# Patient Record
Sex: Male | Born: 1970 | Race: White | Hispanic: No | Marital: Married | Smoking: Current every day smoker
Health system: Southern US, Community
[De-identification: ages and names within clinical notes are randomized; demographics above are authoritative.]

## PROBLEM LIST (undated history)

## (undated) DIAGNOSIS — C801 Malignant (primary) neoplasm, unspecified: Secondary | ICD-10-CM

## (undated) DIAGNOSIS — Z720 Tobacco use: Secondary | ICD-10-CM

## (undated) HISTORY — DX: Malignant (primary) neoplasm, unspecified: C80.1

## (undated) HISTORY — PX: RECTAL SURGERY: SHX760

## (undated) HISTORY — PX: HERNIA REPAIR: SHX51

---

## 2005-06-08 HISTORY — PX: INGUINAL HERNIA REPAIR: SHX194

## 2005-11-06 ENCOUNTER — Ambulatory Visit (HOSPITAL_BASED_OUTPATIENT_CLINIC_OR_DEPARTMENT_OTHER): Admission: RE | Admit: 2005-11-06 | Discharge: 2005-11-06 | Payer: Self-pay | Admitting: *Deleted

## 2007-03-25 ENCOUNTER — Emergency Department (HOSPITAL_COMMUNITY): Admission: EM | Admit: 2007-03-25 | Discharge: 2007-03-25 | Payer: Self-pay | Admitting: Emergency Medicine

## 2010-10-24 NOTE — Op Note (Signed)
James Coffey, James Coffey                 ACCOUNT NO.:  [id number]   MEDICAL RECORD NO.:  [id number]          PATIENT TYPE:  AMB   LOCATION:  [facility]                         FACILITY:  [facility]   PHYSICIAN:  Alfonse Ras, MD   DATE OF BIRTH:  Apr 21, 1971   DATE OF PROCEDURE:  11/06/2005  DATE OF DISCHARGE:                                 OPERATIVE REPORT   PREOPERATIVE DIAGNOSIS:  Recurrent left inguinal hernia.   POSTOPERATIVE DIAGNOSIS:  Recurrent left inguinal hernia.   PROCEDURE:  Recurrent left inguinal hernia repair with mesh.   SURGEON:  Alfonse Ras, MD.   ANESTHESIA:  General.   DESCRIPTION OF PROCEDURE:  The patient was taken to the operating room,  placed in a supine position. After adequate anesthesia was induced using a  laryngeal mask, the left groin was prepped and draped in the normal sterile  fashion. Using an oblique incision over the left inguinal canal, I dissected  down to the external oblique fascia and opened it along its fibers. The  ilioinguinal nerve could not be identified secondary to significant fibrosis  and scar. The spermatic cord was surrounded at the external ring. A Penrose  drain was placed around it and a direct hernia defect was identified. This  was reduced into the abdominal cavity up to the internal ring. The floor of  Hasselbalch's triangle was reinforced using interrupted #0 Surgilon sutures  and approximating transversalis fascia to the inguinal ligament. A piece of  Prolene mesh was then cut to fit and tacked using a running 2-0 Prolene  suture from the pubic tubercle along the inguinal ligament and brought out  lateral to the internal ring. It was also tacked to the transversalis fascia  superiorly. The internal ring allowed __________ Tresa Endo clamp tip through it  along with the spermatic cord. Adequate hemostasis was ensured. The external  oblique fascia was closed with running 3-0 Vicryl. All tissues were injected  with 0.5 Marcaine. The  skin was closed with staples. The patient tolerated  the procedure well and went to PACU in good condition.      Alfonse Ras, MD  Electronically Signed     KRE/MEDQ  D:  11/06/2005  T:  11/06/2005  Job:  [id number]

## 2013-04-11 ENCOUNTER — Ambulatory Visit: Payer: Self-pay | Admitting: Physician Assistant

## 2013-04-11 ENCOUNTER — Ambulatory Visit: Payer: Self-pay

## 2013-04-11 VITALS — BP 100/60 | HR 70 | Temp 97.0°F | Resp 16 | Ht 69.5 in | Wt 163.0 lb

## 2013-04-11 DIAGNOSIS — Z0289 Encounter for other administrative examinations: Secondary | ICD-10-CM

## 2013-04-11 NOTE — Progress Notes (Signed)
U/A SpGr- 1.010 Protein- Neg Blood- Neg Glucose- Neg 

## 2013-04-11 NOTE — Progress Notes (Signed)
This patient presents for DOT examination for fitness for duty.   Medical History: no  Any illness or injury in the last 5 years? no  Head/Brain Injuries, disorders or illnesses no  Seizures, epilepsy no  Eye disorders or impaired vision (except corrective lenses) no  Ear disorders, loss of hearing or balance no  Heart disease or heart attack; other cardiovascular condition no  Heart surgery (valve replacement/bypass, angioplasty, pacemaker) no  High blood pressure no  Muscular disease no  Shortness of breath no  Lung disease, emphysema, asthma, chronic bronchitis no  Kidney disease, dialysis no  Liver disease no  Digestive problems no  Diabetes or elevated blood sugar no  Nervious or psychiatric disorders, e.g., severe depression no  Loss of, or altered consciousness no  Fainting, dizziness no  Sleep disorders, pauses in breathing while asleep, daytime sleepiness, loud snoring no  Stroke or paralysis no  Missing or impaired hand, arm, foot, leg, finger, toe no  Spinal injury or disease no  Chronic low back pain no  Regular, frequent alcohol use no  Narcotic or habit forming drug use  Current Medications: none  Primary Care Provider: No primary provider on file. Specialists: none  Medical Examiner's Comments on Health History:  Hernia repair about 2007.  No other history of illness or injury.  Denies chronic medical problems.  ROS is negative.  TESTING:   Visual Acuity Screening   Right eye Left eye Both eyes  Without correction: 20/15-2 20/15-2 20/13-1  With correction:     Comments: Peripheral Vision:Right eye 85 degrees.Left eye 85 degrees.The patient can distinguish the colors red, amber and green.   Monocular Vision: no  Hearing Aid used for test: no Hearing Aid required to to meet standard: no Distance from individual at which forced whispered voice can first be heard:   RIGHT ear 10 feet; LEFT ear 10 feet If audiometer used, record hearing loss in  decibels:  RIGHT ear average N/A dB  LEFT ear average N/A dB  BP 100/60  Pulse 70  Temp(Src) 97 F (36.1 C) (Oral)  Resp 16  Ht 5' 9.5" (1.765 m)  Wt 163 lb (73.936 kg)  BMI 23.73 kg/m2  SpO2 98% Pulse rate is regular  Urine Specimen: Specific Gravity 1.010, Protein NEG, Blood NEG, Sugar NEG  Other Testing: not indicated  PHYSICAL EXAMINATION:  1. General Appearance  No Marked overweight, tremor, signs of alcoholism, problem drinking or drug abuse 2. Eyes    No 3. Ears    No 4. Mouth and Throat   No 5. Heart    No 6. Lungs and Chest, not including breast examination No 7. Abdomen and Viscera  No 8. Vascular System   No 9. Genitourinary System  No 10. Extremities-Limb impaired. 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    No 11. Spine, other musculoskeletal No 12. Neurological   No  Comments: normal exam.  Certification Status: does meet standards for 2 year certificate.  Wearing corrective lenses: no Wearing hearing aid: no Accompanied by a N/A waiver/exemption Skill performance Evaluation (SPE) Certificate: no

## 2017-04-12 ENCOUNTER — Encounter: Payer: Self-pay | Admitting: Family Medicine

## 2017-04-12 ENCOUNTER — Ambulatory Visit (INDEPENDENT_AMBULATORY_CARE_PROVIDER_SITE_OTHER): Payer: Self-pay | Admitting: Family Medicine

## 2017-04-12 VITALS — BP 130/76 | HR 92 | Temp 97.9°F | Resp 16 | Ht 69.0 in | Wt 186.0 lb

## 2017-04-12 DIAGNOSIS — Z024 Encounter for examination for driving license: Secondary | ICD-10-CM

## 2017-04-12 NOTE — Progress Notes (Signed)
Subjective:  By signing my name below, I, James Coffey, attest that this documentation has been prepared under the direction and in the presence of James Agreste, MD Electronically Signed: Ladene Artist, ED Scribe 04/12/2017 at 1:52 PM.   Patient ID: James Coffey, male    DOB: [birth year], 46 y.o.   MRN: [id number]  Chief Complaint  Patient presents with  . DOT PE   HPI Declin [name] is a 46 y.o. male who presents to Primary Care at [facility] for a DOT physical. Last physical in 2014. No concerns. 2 year card. Denies changes in medical hx since last physical, chronic medical problems, daily medications.   Visual Acuity Screening   Right eye Left eye Both eyes  Without correction: 20/20 20/20 20/20   With correction:     Comments: Color: 6/6 Normal.  Peripheral vision: 85 degrees both eyes.  Hearing Screening Comments: Whisper Test 10 feet both ears. Normal  There are no active problems to display for this patient.  No past medical history on file. Past Surgical History:  Procedure Laterality Date  . INGUINAL HERNIA REPAIR Right 2007   No Known Allergies Prior to Admission medications   Not on File   Social History   Socioeconomic History  . Marital status: Married    Spouse name: Not on file  . Number of children: Not on file  . Years of education: Not on file  . Highest education level: Not on file  Social Needs  . Financial resource strain: Not on file  . Food insecurity - worry: Not on file  . Food insecurity - inability: Not on file  . Transportation needs - medical: Not on file  . Transportation needs - non-medical: Not on file  Occupational History  . Occupation: truck Geophysicist/field seismologist  Tobacco Use  . Smoking status: Current Every Day Smoker    Packs/day: 1.00    Types: Cigarettes  Substance and Sexual Activity  . Alcohol use: No  . Drug use: No  . Sexual activity: Not on file  Other Topics Concern  . Not on file  Social History Narrative   From [place].  Came  to the [place] in 2000.   Review of Systems    Objective:   Physical Exam  Constitutional: He is oriented to person, place, and time. He appears well-developed and well-nourished.  HENT:  Head: Normocephalic and atraumatic.  Right Ear: External ear normal.  Left Ear: External ear normal.  Mouth/Throat: Oropharynx is clear and moist.  Eyes: Conjunctivae and EOM are normal. Pupils are equal, round, and reactive to light.  Neck: Normal range of motion. Neck supple. No thyromegaly present.  Cardiovascular: Normal rate, regular rhythm, normal heart sounds and intact distal pulses.  Pulmonary/Chest: Effort normal and breath sounds normal. No respiratory distress. He has no wheezes.  Abdominal: Soft. He exhibits no distension. There is no tenderness. Hernia confirmed negative in the right inguinal area and confirmed negative in the left inguinal area.  Musculoskeletal: Normal range of motion. He exhibits no edema or tenderness.  Some shortening of L arm but able to ABduct. Full strength and motion for operation of wheel and gearing.  Lymphadenopathy:    He has no cervical adenopathy.  Neurological: He is alert and oriented to person, place, and time. He has normal reflexes.  Skin: Skin is warm and dry.  Psychiatric: He has a normal mood and affect. His behavior is normal.  Vitals reviewed.  Vitals:   04/12/17 1338  BP: 130/76  Pulse: 92  Resp: 16  Temp: 97.9 F (36.6 C)  TempSrc: Oral  SpO2: 98%  Weight: 186 lb (84.4 kg)  Height: 5\' 9"  (1.753 m)      Assessment & Plan:  James Coffey is a 46 y.o. 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 No concerning findings on exam, able to have sufficient range of motion, strength, and reach with left arm, no restrictions, 2 year card. See paperwork. No orders of the defined types were placed in this  encounter.  Patient Instructions    2 year card provided.    IF you received an x-ray today, you will receive an invoice from [facility]. Please contact [facility] at [phone] with questions or concerns regarding your invoice.   IF you received labwork today, you will receive an invoice from [facility]. Please contact [facility] at [phone] with questions or concerns regarding your invoice.   Our billing staff will not be able to assist you with questions regarding bills from these companies.  You will be contacted with the lab results as soon as they are available. The fastest way to get your results is to activate your My Chart account. Instructions are located on the last page of this paperwork. If you have not heard from [place] regarding the results in 2 weeks, please contact this office.      I personally performed the services described in this documentation, which was scribed in my presence. The recorded information has been reviewed and considered for accuracy and completeness, addended by me as needed, and agree with information above.  Signed,   Merri Ray, MD Primary [facility] at [employer].  04/12/17 2:05 PM

## 2017-04-12 NOTE — Patient Instructions (Addendum)
  2 year card provided.    IF you received an x-ray today, you will receive an invoice from [facility]. Please contact [facility] at [phone] with questions or concerns regarding your invoice.   IF you received labwork today, you will receive an invoice from [place]. Please contact [facility] at [phone] with questions or concerns regarding your invoice.   Our billing staff will not be able to assist you with questions regarding bills from these companies.  You will be contacted with the lab results as soon as they are available. The fastest way to get your results is to activate your My Chart account. Instructions are located on the last page of this paperwork. If you have not heard from [place] regarding the results in 2 weeks, please contact this office.

## 2019-09-01 ENCOUNTER — Ambulatory Visit: Payer: Self-pay

## 2019-09-07 ENCOUNTER — Ambulatory Visit: Payer: Self-pay | Attending: Internal Medicine

## 2019-09-07 DIAGNOSIS — Z23 Encounter for immunization: Secondary | ICD-10-CM

## 2019-09-07 NOTE — Progress Notes (Signed)
   Covid-19 Vaccination Clinic  Name:  James Coffey    MRN: [id number] DOB: September 05, 1970  09/07/2019  [name] was observed post Covid-19 immunization for 15 minutes without incident. He was provided with Vaccine Information Sheet and instruction to access the V-Safe system.   [name] was instructed to call 911 with any severe reactions post vaccine: [name] Difficulty breathing  . Swelling of face and throat  . A fast heartbeat  . A bad rash all over body  . Dizziness and weakness   Immunizations Administered    Name Date Dose VIS Date Route   Pfizer COVID-19 Vaccine 09/07/2019  8:53 AM 0.3 mL 05/19/2019 Intramuscular   Manufacturer: [place]   Lot: [id number]   [place]: [id number]

## 2019-10-03 ENCOUNTER — Ambulatory Visit: Payer: Self-pay

## 2019-10-04 ENCOUNTER — Ambulatory Visit: Payer: Self-pay | Attending: Internal Medicine

## 2019-10-04 DIAGNOSIS — Z23 Encounter for immunization: Secondary | ICD-10-CM

## 2019-10-04 NOTE — Progress Notes (Signed)
   Covid-19 Vaccination Clinic  Name:  James Coffey    MRN: [id number] DOB: May 16, 1971  10/04/2019  [name] was observed post Covid-19 immunization for 15 minutes without incident. He was provided with Vaccine Information Sheet and instruction to access the V-Safe system.   [name] was instructed to call 911 with any severe reactions post vaccine: [name] Difficulty breathing  . Swelling of face and throat  . A fast heartbeat  . A bad rash all over body  . Dizziness and weakness   Immunizations Administered    Name Date Dose VIS Date Route   Pfizer COVID-19 Vaccine 10/04/2019  8:54 AM 0.3 mL 08/02/2018 Intramuscular   Manufacturer: [place]   Lot: [id number]   [place]: [id number]

## 2020-11-03 ENCOUNTER — Emergency Department (HOSPITAL_COMMUNITY): Payer: Self-pay

## 2020-11-03 ENCOUNTER — Encounter (HOSPITAL_COMMUNITY): Payer: Self-pay

## 2020-11-03 ENCOUNTER — Other Ambulatory Visit: Payer: Self-pay

## 2020-11-03 ENCOUNTER — Inpatient Hospital Stay (HOSPITAL_COMMUNITY)
Admission: EM | Admit: 2020-11-03 | Discharge: 2020-11-05 | DRG: 394 | Disposition: A | Discharge status: Home / Self Care | Payer: Self-pay | Attending: Internal Medicine | Admitting: Internal Medicine

## 2020-11-03 DIAGNOSIS — F17210 Nicotine dependence, cigarettes, uncomplicated: Secondary | ICD-10-CM | POA: Diagnosis present

## 2020-11-03 DIAGNOSIS — Z20822 Contact with and (suspected) exposure to covid-19: Secondary | ICD-10-CM | POA: Diagnosis present

## 2020-11-03 DIAGNOSIS — R000 Tachycardia, unspecified: Secondary | ICD-10-CM | POA: Diagnosis present

## 2020-11-03 DIAGNOSIS — S31839A Unspecified open wound of anus, initial encounter: Secondary | ICD-10-CM | POA: Diagnosis present

## 2020-11-03 DIAGNOSIS — K611 Rectal abscess: Secondary | ICD-10-CM | POA: Diagnosis present

## 2020-11-03 DIAGNOSIS — R7303 Prediabetes: Secondary | ICD-10-CM | POA: Diagnosis present

## 2020-11-03 DIAGNOSIS — A419 Sepsis, unspecified organism: Secondary | ICD-10-CM | POA: Diagnosis present

## 2020-11-03 DIAGNOSIS — R739 Hyperglycemia, unspecified: Secondary | ICD-10-CM | POA: Diagnosis present

## 2020-11-03 DIAGNOSIS — L0390 Cellulitis, unspecified: Secondary | ICD-10-CM | POA: Diagnosis present

## 2020-11-03 DIAGNOSIS — K612 Anorectal abscess: Principal | ICD-10-CM | POA: Diagnosis present

## 2020-11-03 DIAGNOSIS — K5900 Constipation, unspecified: Secondary | ICD-10-CM | POA: Diagnosis present

## 2020-11-03 DIAGNOSIS — R1030 Lower abdominal pain, unspecified: Secondary | ICD-10-CM | POA: Diagnosis present

## 2020-11-03 DIAGNOSIS — K610 Anal abscess: Secondary | ICD-10-CM

## 2020-11-03 DIAGNOSIS — Z720 Tobacco use: Secondary | ICD-10-CM | POA: Diagnosis present

## 2020-11-03 DIAGNOSIS — K6289 Other specified diseases of anus and rectum: Secondary | ICD-10-CM | POA: Diagnosis present

## 2020-11-03 DIAGNOSIS — L03818 Cellulitis of other sites: Secondary | ICD-10-CM

## 2020-11-03 LAB — CBC
HCT: 40.4 % (ref 39.0–52.0)
Hemoglobin: 13.5 g/dL (ref 13.0–17.0)
MCH: 29.6 pg (ref 26.0–34.0)
MCHC: 33.4 g/dL (ref 30.0–36.0)
MCV: 88.6 fL (ref 80.0–100.0)
Platelets: 345 10*3/uL (ref 150–400)
RBC: 4.56 MIL/uL (ref 4.22–5.81)
RDW: 12.7 % (ref 11.5–15.5)
WBC: 17.7 10*3/uL — ABNORMAL HIGH (ref 4.0–10.5)
nRBC: 0 % (ref 0.0–0.2)

## 2020-11-03 LAB — RESP PANEL BY RT-PCR (FLU A&B, COVID) ARPGX2
Influenza A by PCR: NEGATIVE
Influenza B by PCR: NEGATIVE
SARS Coronavirus 2 by RT PCR: NEGATIVE

## 2020-11-03 LAB — PROCALCITONIN: Procalcitonin: 0.31 ng/mL

## 2020-11-03 LAB — COMPREHENSIVE METABOLIC PANEL
ALT: 27 U/L (ref 0–44)
AST: 34 U/L (ref 15–41)
Albumin: 3.6 g/dL (ref 3.5–5.0)
Alkaline Phosphatase: 101 U/L (ref 38–126)
Anion gap: 11 (ref 5–15)
BUN: 20 mg/dL (ref 6–20)
CO2: 24 mmol/L (ref 22–32)
Calcium: 9.4 mg/dL (ref 8.9–10.3)
Chloride: 101 mmol/L (ref 98–111)
Creatinine, Ser: 0.83 mg/dL (ref 0.61–1.24)
GFR, Estimated: >60 mL/min (ref >60)
Glucose, Bld: 122 mg/dL — ABNORMAL HIGH (ref 70–99)
Potassium: 3.7 mmol/L (ref 3.5–5.1)
Sodium: 136 mmol/L (ref 135–145)
Total Bilirubin: 0.6 mg/dL (ref 0.3–1.2)
Total Protein: 8.4 g/dL — ABNORMAL HIGH (ref 6.5–8.1)

## 2020-11-03 LAB — LACTIC ACID, PLASMA: Lactic Acid, Venous: 0.9 mmol/L (ref 0.5–1.9)

## 2020-11-03 LAB — LIPASE, BLOOD: Lipase: 22 U/L (ref 11–51)

## 2020-11-03 MED ORDER — SODIUM CHLORIDE 0.9 % IV SOLN
1000.0000 mL | INTRAVENOUS | Status: DC
  Administered 2020-11-03: 1000 mL via INTRAVENOUS

## 2020-11-03 MED ORDER — PIPERACILLIN-TAZOBACTAM 3.375 G IVPB
3.3750 g | Freq: Three times a day (TID) | INTRAVENOUS | Status: DC
  Administered 2020-11-04 – 2020-11-05 (×5): 3.375 g via INTRAVENOUS
  Filled 2020-11-03 (×6): qty 50

## 2020-11-03 MED ORDER — SODIUM CHLORIDE 0.9 % IV BOLUS (SEPSIS)
1000.0000 mL | Freq: Once | INTRAVENOUS | Status: AC
  Administered 2020-11-03: 1000 mL via INTRAVENOUS

## 2020-11-03 MED ORDER — HYDROCODONE-ACETAMINOPHEN 5-325 MG PO TABS
1.0000 | ORAL_TABLET | ORAL | Status: DC | PRN
  Administered 2020-11-03: 1 via ORAL
  Filled 2020-11-03: qty 1

## 2020-11-03 MED ORDER — MORPHINE SULFATE (PF) 4 MG/ML IV SOLN
4.0000 mg | Freq: Once | INTRAVENOUS | Status: AC
  Administered 2020-11-03: 4 mg via INTRAVENOUS
  Filled 2020-11-03: qty 1

## 2020-11-03 MED ORDER — ACETAMINOPHEN 325 MG PO TABS: 650.0000 mg | ORAL_TABLET | Freq: Four times a day (QID) | ORAL | Status: DC | PRN

## 2020-11-03 MED ORDER — ACETAMINOPHEN 650 MG RE SUPP: 650.0000 mg | Freq: Four times a day (QID) | RECTAL | Status: DC | PRN

## 2020-11-03 MED ORDER — SODIUM CHLORIDE 0.9 % IV SOLN
75.0000 mL/h | INTRAVENOUS | Status: AC
  Administered 2020-11-03 – 2020-11-04 (×2): 75 mL/h via INTRAVENOUS

## 2020-11-03 MED ORDER — PIPERACILLIN-TAZOBACTAM 3.375 G IVPB 30 MIN
3.3750 g | Freq: Once | INTRAVENOUS | Status: AC
  Administered 2020-11-03: 3.375 g via INTRAVENOUS
  Filled 2020-11-03: qty 50

## 2020-11-03 MED ORDER — NICOTINE 21 MG/24HR TD PT24
21.0000 mg | MEDICATED_PATCH | Freq: Every day | TRANSDERMAL | Status: DC
  Administered 2020-11-03: 21 mg via TRANSDERMAL
  Filled 2020-11-03 (×2): qty 1

## 2020-11-03 MED ORDER — SODIUM CHLORIDE 0.9 % IV BOLUS
1000.0000 mL | Freq: Once | INTRAVENOUS | Status: AC
  Administered 2020-11-03: 1000 mL via INTRAVENOUS

## 2020-11-03 NOTE — ED Provider Notes (Signed)
[facility] DEPT Provider Note   CSN: [id number] Arrival date & time: 11/03/20  1608     History Chief Complaint  Patient presents with  . Abdominal Pain  . rectal abscess    James Coffey is a 50 y.o. male.  HPI   Patient presents to the ED for evaluation of abdominal and perirectal pain.  Patient states he has been having intermittent lower abdominal pain for few months.  Several days ago he started having pain in the perirectal region.  He has felt feverish.  He has had increasing tenderness and swelling in that area and then started to notice some drainage.  He denies any vomiting.  No diarrhea.  History reviewed. No pertinent past medical history.  Patient Active Problem List   Diagnosis Date Noted  . Perirectal abscess 11/03/2020  . Cellulitis 11/03/2020  . Proctitis 11/03/2020  . Sepsis ([place]) 11/03/2020  . Tobacco abuse 11/03/2020    Past Surgical History:  Procedure Laterality Date  . HERNIA REPAIR    . INGUINAL HERNIA REPAIR Right 2007       Family History  Problem Relation Age of Onset  . CAD Neg Hx   . Inflammatory bowel disease Neg Hx     Social History   Tobacco Use  . Smoking status: Current Every Day Smoker    Packs/day: 1.00    Types: Cigarettes  . Smokeless tobacco: Never Used  Vaping Use  . Vaping Use: Never used  Substance Use Topics  . Alcohol use: No  . Drug use: No    Home Medications Prior to Admission medications   Medication Sig Start Date End Date Taking? Authorizing Provider  ibuprofen (ADVIL) 200 MG tablet Take 200 mg by mouth every 6 (six) hours as needed for mild pain.   Yes [provider]    Allergies    Patient has no known allergies.  Review of Systems   Review of Systems  All other systems reviewed and are negative.   Physical Exam Updated Vital Signs BP 90/68 (BP Location: Left Arm)   Pulse 83   Temp 98.6 F (37 C) (Oral)   Resp 20   Ht 1.829 m (6')   Wt 70.5 kg    SpO2 98%   BMI 21.08 kg/m   Physical Exam Vitals and nursing note reviewed.  Constitutional:      General: He is not in acute distress.    Appearance: He is well-developed.  HENT:     Head: Normocephalic and atraumatic.     Right Ear: External ear normal.     Left Ear: External ear normal.  Eyes:     General: No scleral icterus.       Right eye: No discharge.        Left eye: No discharge.     Conjunctiva/sclera: Conjunctivae normal.  Neck:     Trachea: No tracheal deviation.  Cardiovascular:     Rate and Rhythm: Normal rate and regular rhythm.  Pulmonary:     Effort: Pulmonary effort is normal. No respiratory distress.     Breath sounds: Normal breath sounds. No stridor. No wheezing or rales.  Abdominal:     General: Bowel sounds are normal. There is no distension.     Palpations: Abdomen is soft.     Tenderness: There is no abdominal tenderness. There is no guarding or rebound.  Genitourinary:    Comments: Induration of the right buttock around the perianal region and ischial tuberosity purulent  drainage, erythematous and tender to palpation Musculoskeletal:        General: No tenderness.     Cervical back: Neck supple.  Skin:    General: Skin is warm and dry.     Findings: No rash.  Neurological:     Mental Status: He is alert.     Cranial Nerves: No cranial nerve deficit (no facial droop, extraocular movements intact, no slurred speech).     Sensory: No sensory deficit.     Motor: No abnormal muscle tone or seizure activity.     Coordination: Coordination normal.     ED Results / Procedures / Treatments   Labs (all labs ordered are listed, but only abnormal results are displayed) Labs Reviewed  COMPREHENSIVE METABOLIC PANEL - Abnormal; Notable for the following components:      Result Value   Glucose, Bld 122 (*)    Total Protein 8.4 (*)    All other components within normal limits  CBC - Abnormal; Notable for the following components:   WBC 17.7 (*)     All other components within normal limits  CBC WITH DIFFERENTIAL/PLATELET - Abnormal; Notable for the following components:   RBC 3.58 (*)    Hemoglobin 10.6 (*)    HCT 32.5 (*)    All other components within normal limits  COMPREHENSIVE METABOLIC PANEL - Abnormal; Notable for the following components:   Potassium 3.2 (*)    Calcium 8.4 (*)    Total Protein 5.8 (*)    Albumin 2.7 (*)    All other components within normal limits  RESP PANEL BY RT-PCR (FLU A&B, COVID) ARPGX2  MRSA PCR SCREENING  CULTURE, BLOOD (ROUTINE X 2)  CULTURE, BLOOD (ROUTINE X 2)  LIPASE, BLOOD  URINALYSIS, ROUTINE W REFLEX MICROSCOPIC  LACTIC ACID, PLASMA  LACTIC ACID, PLASMA  PROCALCITONIN  HIV ANTIBODY (ROUTINE TESTING W REFLEX)  MAGNESIUM  PHOSPHORUS  TSH  HEMOGLOBIN A1C    EKG None  Radiology CT ABDOMEN PELVIS WO CONTRAST  Result Date: 11/03/2020 CLINICAL DATA:  Abdominal pain and fever. Perirectal abscess. Patient reports intermittent lower abdominal pain for 3 months. Rectal pain for 3-4 days. EXAM: CT ABDOMEN AND PELVIS WITHOUT CONTRAST TECHNIQUE: Multidetector CT imaging of the abdomen and pelvis was performed following the standard protocol without IV contrast. COMPARISON:  None. FINDINGS: Lower chest: Clear lung bases. Hepatobiliary: No focal liver abnormality is seen. No gallstones, gallbladder wall thickening, or biliary dilatation. Pancreas: Unremarkable. No pancreatic ductal dilatation or surrounding inflammatory changes. Spleen: Normal in size without focal abnormality. Adrenals/Urinary Tract: No adrenal masses. Kidneys normal size, orientation and position. Vague low-attenuation lesion from the medial upper pole of the right kidney, proximally 9 mm in size, not fully characterized, but likely a cyst. No other renal masses or lesions, no stones and no hydronephrosis. Normal ureters. Normal bladder. Stomach/Bowel: Eccentric thickening of the inferior rectal wall. Middle and upper rectum are  unremarkable. Colon and small bowel are normal in caliber. No wall thickening. No inflammation. Normal appearance of the stomach. No evidence of appendicitis. Vascular/Lymphatic: Aortic atherosclerosis. No aneurysm. Prominent right inguinal lymph nodes, largest measuring 1 cm short axis. No other adenopathy. Reproductive: Prostate is unremarkable. Other: There is soft tissue air and inflammation along the medial aspect of the right buttock and right perineum with ill-defined soft tissue surrounding anus. Inflammation extends to the inferior rectum just above the levator sling. There is no defined fluid collection to suggest an abscess. No ascites. Musculoskeletal: No fracture or acute finding.  No bone lesion. No bone resorption to suggest osteomyelitis. IMPRESSION: 1. Perirectal/perianal infection reflected by soft tissue inflammation and soft tissue air, along with thickening of the inferior rectal wall. Mild inflammatory change extends just above the levator sling adjacent to the inferior rectum. There is no fluid collection to suggest an abscess, however. There are prominent right inguinal lymph nodes, presumed reactive to the perineal inflammation. 2. No other acute abnormality within the abdomen or pelvis. 3. Aortic atherosclerosis. Electronically Signed   By: Lajean Manes M.D.   On: 11/03/2020 18:33    Procedures Procedures   Medications Ordered in ED Medications  0.9 %  sodium chloride infusion (75 mL/hr Intravenous New Bag/Given 11/04/20 0627)  acetaminophen (TYLENOL) tablet 650 mg (has no administration in time range)    Or  acetaminophen (TYLENOL) suppository 650 mg (has no administration in time range)  HYDROcodone-acetaminophen (NORCO/VICODIN) 5-325 MG per tablet 1-2 tablet (1 tablet Oral Given 11/03/20 2346)  piperacillin-tazobactam (ZOSYN) IVPB 3.375 g (3.375 g Intravenous New Bag/Given 11/04/20 1224)  nicotine (NICODERM CQ - dosed in mg/24 hours) patch 21 mg (21 mg Transdermal Patch  Applied 11/03/20 2348)  feeding supplement (ENSURE ENLIVE / ENSURE PLUS) liquid 237 mL (237 mLs Oral Given 11/04/20 0924)  0.9 %  sodium chloride infusion (has no administration in time range)  sodium chloride 0.9 % bolus 1,000 mL (0 mLs Intravenous Stopped 11/03/20 2119)    Followed by  0.9 %  sodium chloride infusion (has no administration in time range)  potassium chloride SA (KLOR-CON) CR tablet 40 mEq (has no administration in time range)  naproxen (NAPROSYN) tablet 250 mg (has no administration in time range)  piperacillin-tazobactam (ZOSYN) IVPB 3.375 g (0 g Intravenous Stopped 11/03/20 2003)  morphine 4 MG/ML injection 4 mg (4 mg Intravenous Given 11/03/20 1858)  sodium chloride 0.9 % bolus 1,000 mL (1,000 mLs Intravenous New Bag/Given 11/03/20 2156)    ED Course  I have reviewed the triage vital signs and the nursing notes.  Pertinent labs & imaging results that were available during my care of the patient were reviewed by me and considered in my medical decision making (see chart for details).  Clinical Course as of 11/04/20 1515  Sun Nov 03, 2020  1837 CT scan shows evidence of perianal/perirectal infection [JK]  1838 Evidence of inflammation around the levator sling but no definite abscess.  Will consult with general surgery.  Patient has been given a dose of Zosyn IV [JK]  1852 Discussed case with Dr. Brantley Stage.  Does not feel the patient would require any surgical intervention at this point.  It appears he has a spontaneously draining abscess.  Recommends antibiotic treatment [JK]    Clinical Course User Index [JK] Dorie Rank, MD   MDM Rules/Calculators/A&P                          Patient has evidence of perirectal abscess on exam.  CT scan does not show any signs of drainable fluid at this time.  It appears to be spontaneously draining.  General surgery was consulted.  Recommended treatment.  With the patient's leukocytosis and his persistent pain I do think he would benefit  from IV antibiotic treatment.  We will consult with medical service for admission Final Clinical Impression(s) / ED Diagnoses Final diagnoses:  Proctitis  Perianal abscess      Dorie Rank, MD 11/04/20 [id number]

## 2020-11-03 NOTE — ED Triage Notes (Signed)
Patient c/o intermittent lower abdominal pain approx 3 months. Patient states he has had rectal pain x 3-4 days. Patient is not sure if he is having any bleeding or drainage from the rectum.

## 2020-11-03 NOTE — ED Provider Notes (Signed)
Emergency Medicine Provider Triage Evaluation Note  James Coffey , a 50 y.o. male  was evaluated in triage.  Pt complains of a rectal abscess that started 3-4 days ago.  Review of Systems  Positive: Rectal abscess Negative: Fevers, nv  Physical Exam  BP 120/84 (BP Location: Left Arm)   Pulse (!) 106   Temp 98.1 F (36.7 C) (Oral)   Resp 18   Ht 6' (1.829 m)   SpO2 99%   BMI 25.23 kg/m  Gen:   Awake, no distress   Resp:  Normal effort  MSK:   Moves extremities without difficulty  Other:  Erythema, induration, warmth and tenderness to the right buttock and around the rectum. There is an open wound that is actively draining pus  Medical Decision Making  Medically screening exam initiated at 4:56 PM.  Appropriate orders placed.  [name] was informed that the remainder of the evaluation will be completed by another provider, this initial triage assessment does not replace that evaluation, and the importance of remaining in the ED until their evaluation is complete.     Bishop Dublin 11/03/20 1659    Dorie Rank, MD 11/04/20 1530

## 2020-11-03 NOTE — H&P (Signed)
James Coffey LNZ:[id number] DOB: 03/21/1971 DOA: 11/03/2020     PCP: Patient, No Pcp Per (Inactive)   Outpatient Specialists:   NONE    Patient arrived to ER on 11/03/20 at 1608 Referred by Attending Toy Baker, MD   Patient coming from: home Lives  With family    Chief Complaint:    Chief Complaint  Patient presents with  . Abdominal Pain  . rectal abscess    HPI: James Coffey is a 50 y.o. male with medical history significant of tobacco abuse    Presented with  Lower abd pain for the past 6 month but also rectal pain for 3-4 day Reports subjective fevers No N/V/d  Infectious risk factors:  Reports fever,    Has  been vaccinated against COVID     Initial COVID TEST   in house  PCR testing  Pending  No results found for: SARSCOV2NAA   While in ER: Noted to have WBC 17 CT abd showed protitis and perirectal inflamation    ED Triage Vitals  Enc Vitals Group     BP 11/03/20 1636 120/84     Pulse Rate 11/03/20 1636 (!) 106     Resp 11/03/20 1636 18     Temp 11/03/20 1636 98.1 F (36.7 C)     Temp Source 11/03/20 1636 Oral     SpO2 11/03/20 1636 99 %     Weight --      Height 11/03/20 1636 6' (1.829 m)     Head Circumference --      Peak Flow --      Pain Score 11/03/20 1648 0     Pain Loc --      Pain Edu? --      Excl. in [place]? --   TMAX(24)@     _________________________________________ Significant initial  Findings: Abnormal Labs Reviewed  COMPREHENSIVE METABOLIC PANEL - Abnormal; Notable for the following components:      Result Value   Glucose, Bld 122 (*)    Total Protein 8.4 (*)    All other components within normal limits  CBC - Abnormal; Notable for the following components:   WBC 17.7 (*)    All other components within normal limits   ____________________________________________ Ordered   CTabd/pelvis - Perirectal/perianal infection reflected by soft tissue inflammation and soft tissue air, along with thickening of  the inferior rectal wall. Mild inflammatory change extends just above the levator sling adjacent to the inferior rectum. There is no fluid collection to suggest an abscess, however   ECG:not  Ordered ____________________ This patient meets SIRS Criteria and may be septic.    The recent clinical data is shown below. Vitals:   11/03/20 1636 11/03/20 1800 11/03/20 1929  BP: 120/84 120/81 106/66  Pulse: (!) 106 96 91  Resp: _0 Temp: 98.1 F (36.7 C)    TempSrc: Oral    SpO2: 99% 100% 98%  Height: 6' (1.829 m)       WBC     Component Value Date/Time   WBC 17.7 (H) 11/03/2020 1714   Procalcitonin  Ordered   UA not ordered    No results found for this or any previous visit.   _______________________________________________________ ER Provider Called:  gen Surgery  Dr.Cournett  They Recommend admit to medicine  Will see in AM   _______________________________________________ Hospitalist was called for admission for perirectal cellulitis   The following Work up has been ordered so far:  Orders Placed This Encounter  Procedures  . Resp Panel by RT-PCR (Flu A&B, Covid) Nasopharyngeal Swab  . MRSA PCR Screening  . CT ABDOMEN PELVIS WO CONTRAST  . Lipase, blood  . Comprehensive metabolic panel  . CBC  . Urinalysis, Routine w reflex microscopic  . Hemoglobin A1c  . Diet NPO time specified  . Saline Lock IV, Maintain IV access  . Cardiac monitoring  . Consult to general surgery  . Consult to hospitalist  . Airborne and Contact precautions  . Admit to Inpatient (patient's expected length of stay will be greater than 2 midnights or inpatient only procedure)     Following Medications were ordered in ER: Medications  sodium chloride 0.9 % bolus 1,000 mL (1,000 mLs Intravenous New Bag/Given 11/03/20 1904)    Followed by  0.9 %  sodium chloride infusion (1,000 mLs Intravenous New Bag/Given 11/03/20 1907)  piperacillin-tazobactam (ZOSYN) IVPB 3.375 g (3.375 g  Intravenous New Bag/Given (Non-Interop) 11/03/20 1906)  morphine 4 MG/ML injection 4 mg (4 mg Intravenous Given 11/03/20 1858)        Consult Orders  (From admission, onward)         Start     Ordered   11/03/20 1925  Consult to hospitalist  Once       Provider:  (Not yet assigned)  Question Answer Comment  Place call to: [facility]   Reason for Consult Admit      11/03/20 1924          OTHER Significant initial  Findings:  labs showing:    Recent Labs  Lab 11/03/20 1714  NA 136  K 3.7  CO2 24  GLUCOSE 122*  BUN 20  CREATININE 0.83  CALCIUM 9.4    Cr stable,    Lab Results  Component Value Date   CREATININE 0.83 11/03/2020    Recent Labs  Lab 11/03/20 1714  AST 34  ALT 27  ALKPHOS 101  BILITOT 0.6  PROT 8.4*  ALBUMIN 3.6   Lab Results  Component Value Date   CALCIUM 9.4 11/03/2020      Plt: Lab Results  Component Value Date   PLT 345 11/03/2020         Recent Labs  Lab 11/03/20 1714  WBC 17.7*  HGB 13.5  HCT 40.4  MCV 88.6  PLT 345    HG/HCT   stable,      Component Value Date/Time   HGB 13.5 11/03/2020 1714   HCT 40.4 11/03/2020 1714   MCV 88.6 11/03/2020 1714      Recent Labs  Lab 11/03/20 1714  LIPASE 22        Cultures: No results found for: SDES, SPECREQUEST, CULT, REPTSTATUS   Radiological Exams on Admission: CT ABDOMEN PELVIS WO CONTRAST  Result Date: 11/03/2020 CLINICAL DATA:  Abdominal pain and fever. Perirectal abscess. Patient reports intermittent lower abdominal pain for 3 months. Rectal pain for 3-4 days. EXAM: CT ABDOMEN AND PELVIS WITHOUT CONTRAST TECHNIQUE: Multidetector CT imaging of the abdomen and pelvis was performed following the standard protocol without IV contrast. COMPARISON:  None. FINDINGS: Lower chest: Clear lung bases. Hepatobiliary: No focal liver abnormality is seen. No gallstones, gallbladder wall thickening, or biliary dilatation. Pancreas: Unremarkable. No pancreatic ductal  dilatation or surrounding inflammatory changes. Spleen: Normal in size without focal abnormality. Adrenals/Urinary Tract: No adrenal masses. Kidneys normal size, orientation and position. Vague low-attenuation lesion from the medial upper pole of the right kidney, proximally 9 mm in size, not fully characterized, but likely a  cyst. No other renal masses or lesions, no stones and no hydronephrosis. Normal ureters. Normal bladder. Stomach/Bowel: Eccentric thickening of the inferior rectal wall. Middle and upper rectum are unremarkable. Colon and small bowel are normal in caliber. No wall thickening. No inflammation. Normal appearance of the stomach. No evidence of appendicitis. Vascular/Lymphatic: Aortic atherosclerosis. No aneurysm. Prominent right inguinal lymph nodes, largest measuring 1 cm short axis. No other adenopathy. Reproductive: Prostate is unremarkable. Other: There is soft tissue air and inflammation along the medial aspect of the right buttock and right perineum with Coffey-defined soft tissue surrounding anus. Inflammation extends to the inferior rectum just above the levator sling. There is no defined fluid collection to suggest an abscess. No ascites. Musculoskeletal: No fracture or acute finding. No bone lesion. No bone resorption to suggest osteomyelitis. IMPRESSION: 1. Perirectal/perianal infection reflected by soft tissue inflammation and soft tissue air, along with thickening of the inferior rectal wall. Mild inflammatory change extends just above the levator sling adjacent to the inferior rectum. There is no fluid collection to suggest an abscess, however. There are prominent right inguinal lymph nodes, presumed reactive to the perineal inflammation. 2. No other acute abnormality within the abdomen or pelvis. 3. Aortic atherosclerosis. Electronically Signed   By: Lajean Manes M.D.   On: 11/03/2020 18:33    _______________________________________________________________________________________________________ Latest  Blood pressure 106/66, pulse 91, temperature 98.1 F (36.7 C), temperature source Oral, resp. rate 18, height 6' (1.829 m), SpO2 98 %.   Review of Systems:    Pertinent positives include:   fatigue, abdominal pain, tenesmus  Constitutional:  No weight loss, night sweats, Fevers, chills,weight loss  HEENT:  No headaches, Difficulty swallowing,Tooth/dental problems,Sore throat,  No sneezing, itching, ear ache, nasal congestion, post nasal drip,  Cardio-vascular:  No chest pain, Orthopnea, PND, anasarca, dizziness, palpitations.no Bilateral lower extremity swelling  GI:  No heartburn, indigestion,  nausea, vomiting, diarrhea, change in bowel habits, loss of appetite, melena, blood in stool, hematemesis Resp:  no shortness of breath at rest. No dyspnea on exertion, No excess mucus, no productive cough, No non-productive cough, No coughing up of blood.No change in color of mucus.No wheezing. Skin:  no rash or lesions. No jaundice GU:  no dysuria, change in color of urine, no urgency or frequency. No straining to urinate.  No flank pain.  Musculoskeletal:  No joint pain or no joint swelling. No decreased range of motion. No back pain.  Psych:  No change in mood or affect. No depression or anxiety. No memory loss.  Neuro: no localizing neurological complaints, no tingling, no weakness, no double vision, no gait abnormality, no slurred speech, no confusion  All systems reviewed and apart from James Coffey all are negative _______________________________________________________________________________________________ Past Medical History:  History reviewed. No pertinent past medical history.    Past Surgical History:  Procedure Laterality Date  . HERNIA REPAIR    . INGUINAL HERNIA REPAIR Right 2007    Social History:  Ambulatory  independently c      reports that he has  been smoking cigarettes. He has been smoking about 1.00 pack per day. He has never used smokeless tobacco. He reports that he does not drink alcohol and does not use drugs.   Family History:   Family History  Problem Relation Age of Onset  . CAD Neg Hx   . Inflammatory bowel disease Neg Hx    ______________________________________________________________________________________________ Allergies: No Known Allergies   Prior to Admission medications   Not on File    ___________________________________________________________________________________________________  Physical Exam: Vitals with BMI 11/03/2020 11/03/2020 11/03/2020  Height - - _0   Weight - - -  BMI - - -  Systolic 325 498 264  Diastolic 66 81 84  Pulse 91 96 106     1. General:  in No Acute distress    Chronically Coffey  -appearing 2. Psychological: Alert and  Oriented 3. Head/ENT:     Dry Mucous Membranes                          Head Non traumatic, neck supple                           Poor Dentition 4. SKIN:  decreased Skin turgor,  Skin clean Dry and intact  Draining perirectal purulence 5. Heart: Regular rate and rhythm no  Murmur, no Rub or gallop 6. Lungs:  no wheezes or crackles   7. Abdomen: Soft, non-tender, Non distended  bowel sounds present 8. Lower extremities: no clubbing, cyanosis, no edema 9. Neurologically Grossly intact, moving all 4 extremities equally  10. MSK: Normal range of motion    Chart has been reviewed  ______________________________________________________________________________________________  Assessment/Plan  50 y.o. male with medical history significant of tobacco abuse     Admitted for perirectal cellulitis  Present on Admission: . Perirectal   Cellulitis - admit per cellulitis protocol, continue zosyn CT did not show drainable abscess MRSA screening  . Proctitis -covered with Zosyn appreciate GI consult Will need work-up for possible inflammatory bowel disease.   May need flex sig  . Sepsis ([place])-   -SIRS criteria met with elevated white blood cell count,       Component Value Date/Time   WBC 17.7 (H) 11/03/2020 1714   tachycardia   This patient meets SIRS Criteria     -Most likely source being:   Cellulitis     - Obtain serial lactic acid and procalcitonin level.  - Initiated IV antibiotics   - await results of blood and urine culture  - Rehydrate aggressively      9:23 PM  . Tobacco abuse -  - Spoke about importance of quitting spent 5 minutes discussing options for treatment, prior attempts at quitting, and dangers of smoking  -At this point patient NOT  interested in quitting  - order nicotine patch   - nursing tobacco cessation protocol    Other plan as per orders.  DVT prophylaxis:  SCD      Code Status:    Code Status: Not on file FULL CODE  as per patient   I had personally discussed CODE STATUS with patient     Family Communication:   Family not at  Bedside   Disposition Plan:     To home once workup is complete and patient is stable   Following barriers for discharge:                                                        Pain controlled with PO medications                               , white count improving able to transition to PO antibiotics  Will need to be able to tolerate PO                                                      Will need consultants to evaluate patient prior to discharge                        Consults called: general surgery is Aware  James Coffey is aware will see in AM  Admission status:  ED Disposition    ED Disposition Condition [place]: [place] [[id number]]  Level of Care: Med-Surg [[id number]]  May admit patient to [name] or [name] if equivalent level of care is available:: No  Covid Evaluation: Asymptomatic Screening Protocol (No Symptoms)  Diagnosis: Perirectal abscess [[id number]]  Admitting Physician: Toy Baker [[id number]]  Attending Physician: Toy Baker [[id number]]  Estimated length of stay: past midnight tomorrow  Certification:: I certify this patient will need inpatient services for at least 2 midnights          inpatient     I Expect 2 midnight stay secondary to severity of patient's current illness need for inpatient interventions justified by the following:  hemodynamic instability despite optimal treatment (tachycardia )  Severe lab/radiological/exam abnormalities including:    perirectal ccellultis       That are currently affecting medical management.   I expect  patient to be hospitalized for 2 midnights requiring inpatient medical care.  Patient is at high risk for adverse outcome (such as loss of life or disability) if not treated.     Need for IV antibiotics,     Level of care       medical floor      No results found for: SARSCOV2NAA   Precautions: admitted as  asymptomatic screening protocol    PPE: Used by the provider:   N95  eye Goggles,  Gloves     James Coffey 11/03/2020, 9:20 PM    [facility]     after 2 AM please page floor coverage PA If 7AM-7PM, please contact the day team taking care of the patient using [email or web address]   Patient was evaluated in the context of the global COVID-19 pandemic, which necessitated consideration that the patient might be at risk for infection with the SARS-CoV-2 virus that causes COVID-19. Institutional protocols and algorithms that pertain to the evaluation of patients at risk for COVID-19 are in a state of rapid change based on information released by regulatory bodies including the CDC and federal and state organizations. These policies and algorithms were followed during the patient's care.

## 2020-11-03 NOTE — Progress Notes (Signed)
Pharmacy Antibiotic Note  James Coffey is a 50 y.o. male admitted on 11/03/2020 with perianal/rectal infection.  Pharmacy has been consulted for zosyn dosing.  Plan: Zosyn 3.375g IV q8h (4 hour infusion).  Will sign off for now  Height: 6' (182.9 cm) IBW/kg (Calculated) : 77.6  Temp (24hrs), Avg:98.1 F (36.7 C), Min:98.1 F (36.7 C), Max:98.1 F (36.7 C)  Recent Labs  Lab 11/03/20 1714  WBC 17.7*  CREATININE 0.83    CrCl cannot be calculated (Unknown ideal weight.).    No Known Allergies   Thank you for allowing pharmacy to be a part of this patient's care.  Kara Mead 11/03/2020 8:23 PM

## 2020-11-04 DIAGNOSIS — K610 Anal abscess: Secondary | ICD-10-CM

## 2020-11-04 LAB — MAGNESIUM: Magnesium: 2.2 mg/dL (ref 1.7–2.4)

## 2020-11-04 LAB — URINALYSIS, ROUTINE W REFLEX MICROSCOPIC
Bilirubin Urine: NEGATIVE
Glucose, UA: NEGATIVE mg/dL
Hgb urine dipstick: NEGATIVE
Ketones, ur: NEGATIVE mg/dL
Leukocytes,Ua: NEGATIVE
Nitrite: NEGATIVE
Protein, ur: NEGATIVE mg/dL
Specific Gravity, Urine: 1.018 (ref 1.005–1.030)
pH: 5 (ref 5.0–8.0)

## 2020-11-04 LAB — CBC WITH DIFFERENTIAL/PLATELET
Abs Immature Granulocytes: 0.07 10*3/uL (ref 0.00–0.07)
Basophils Absolute: 0.1 10*3/uL (ref 0.0–0.1)
Basophils Relative: 1 %
Eosinophils Absolute: 0.1 10*3/uL (ref 0.0–0.5)
Eosinophils Relative: 1 %
HCT: 32.5 % — ABNORMAL LOW (ref 39.0–52.0)
Hemoglobin: 10.6 g/dL — ABNORMAL LOW (ref 13.0–17.0)
Immature Granulocytes: 1 %
Lymphocytes Relative: 18 %
Lymphs Abs: 1.7 10*3/uL (ref 0.7–4.0)
MCH: 29.6 pg (ref 26.0–34.0)
MCHC: 32.6 g/dL (ref 30.0–36.0)
MCV: 90.8 fL (ref 80.0–100.0)
Monocytes Absolute: 0.9 10*3/uL (ref 0.1–1.0)
Monocytes Relative: 9 %
Neutro Abs: 6.9 10*3/uL (ref 1.7–7.7)
Neutrophils Relative %: 70 %
Platelets: 235 10*3/uL (ref 150–400)
RBC: 3.58 MIL/uL — ABNORMAL LOW (ref 4.22–5.81)
RDW: 12.9 % (ref 11.5–15.5)
WBC: 9.8 10*3/uL (ref 4.0–10.5)
nRBC: 0 % (ref 0.0–0.2)

## 2020-11-04 LAB — COMPREHENSIVE METABOLIC PANEL
ALT: 16 U/L (ref 0–44)
AST: 16 U/L (ref 15–41)
Albumin: 2.7 g/dL — ABNORMAL LOW (ref 3.5–5.0)
Alkaline Phosphatase: 70 U/L (ref 38–126)
Anion gap: 8 (ref 5–15)
BUN: 14 mg/dL (ref 6–20)
CO2: 22 mmol/L (ref 22–32)
Calcium: 8.4 mg/dL — ABNORMAL LOW (ref 8.9–10.3)
Chloride: 107 mmol/L (ref 98–111)
Creatinine, Ser: 0.63 mg/dL (ref 0.61–1.24)
GFR, Estimated: >60 mL/min (ref >60)
Glucose, Bld: 96 mg/dL (ref 70–99)
Potassium: 3.2 mmol/L — ABNORMAL LOW (ref 3.5–5.1)
Sodium: 137 mmol/L (ref 135–145)
Total Bilirubin: 0.6 mg/dL (ref 0.3–1.2)
Total Protein: 5.8 g/dL — ABNORMAL LOW (ref 6.5–8.1)

## 2020-11-04 LAB — HIV ANTIBODY (ROUTINE TESTING W REFLEX): HIV Screen 4th Generation wRfx: NONREACTIVE

## 2020-11-04 LAB — TSH: TSH: 1.752 u[IU]/mL (ref 0.350–4.500)

## 2020-11-04 LAB — PHOSPHORUS: Phosphorus: 3.7 mg/dL (ref 2.5–4.6)

## 2020-11-04 LAB — LACTIC ACID, PLASMA: Lactic Acid, Venous: 0.8 mmol/L (ref 0.5–1.9)

## 2020-11-04 LAB — MRSA PCR SCREENING: MRSA by PCR: NEGATIVE

## 2020-11-04 MED ORDER — NAPROXEN 250 MG PO TABS
250.0000 mg | ORAL_TABLET | Freq: Two times a day (BID) | ORAL | Status: DC
  Administered 2020-11-05: 250 mg via ORAL
  Filled 2020-11-04 (×3): qty 1

## 2020-11-04 MED ORDER — SODIUM CHLORIDE 0.9 % IV SOLN: 1000.0000 mL | INTRAVENOUS | Status: AC

## 2020-11-04 MED ORDER — POTASSIUM CHLORIDE CRYS ER 20 MEQ PO TBCR: 40.0000 meq | EXTENDED_RELEASE_TABLET | Freq: Once | ORAL | Status: DC

## 2020-11-04 MED ORDER — SODIUM CHLORIDE 0.9 % IV SOLN
INTRAVENOUS | Status: DC | PRN
  Administered 2020-11-04: 1000 mL via INTRAVENOUS

## 2020-11-04 MED ORDER — ENSURE ENLIVE PO LIQD
237.0000 mL | Freq: Two times a day (BID) | ORAL | Status: DC
  Administered 2020-11-04 – 2020-11-05 (×4): 237 mL via ORAL

## 2020-11-04 NOTE — Consult Note (Signed)
Reason for Consult: Rectal pain and infection Referring Physician: Tomi Bamberger MD  Shashank Kwasnik is an 50 y.o. male.  HPI: Pleasant 50 year old male with a 5-day history of drainage from his right buttock and perirectal pain.  This started Wednesday and it began to drain.  This continued to drain and he sought care in the emergency room due to pain and drainage around his rectum and buttock on the right side.  He was worked up last night with a CT scan which showed no Perico abscess but signs of proctitis and possible abscess to drain on its own.  He had a white count of 17,000 and was admitted for IV antibiotics.  We were asked to see to evaluate his perirectal pain and drainage.  He feels better today.  It is still tender but is improved from yesterday and this week.  The drainage is minimal.  He is yellowish and scant.  He has had no fever chills overnight.  History reviewed. No pertinent past medical history.  Past Surgical History:  Procedure Laterality Date  . HERNIA REPAIR    . INGUINAL HERNIA REPAIR Right 2007    Family History  Problem Relation Age of Onset  . CAD Neg Hx   . Inflammatory bowel disease Neg Hx     Social History:  reports that he has been smoking cigarettes. He has been smoking about 1.00 pack per day. He has never used smokeless tobacco. He reports that he does not drink alcohol and does not use drugs.  Allergies: No Known Allergies  Medications: I have reviewed the patient's current medications.  Results for orders placed or performed during the hospital encounter of 11/03/20 (from the past 48 hour(s))  Lipase, blood     Status: None   Collection Time: 11/03/20  5:14 PM  Result Value Ref Range   Lipase 22 11 - 51 U/L    Comment: Performed at [facility], [place] [address] [place], [place] [address]  Comprehensive metabolic panel     Status: Abnormal   Collection Time: 11/03/20  5:14 PM  Result Value Ref Range   Sodium 136 135 - 145 mmol/L    Potassium 3.7 3.5 - 5.1 mmol/L   Chloride 101 98 - 111 mmol/L   CO2 24 22 - 32 mmol/L   Glucose, Bld 122 (H) 70 - 99 mg/dL    Comment: Glucose reference range applies only to samples taken after fasting for at least 8 hours.   BUN 20 6 - 20 mg/dL   Creatinine, Ser 0.83 0.61 - 1.24 mg/dL   Calcium 9.4 8.9 - 10.3 mg/dL   Total Protein 8.4 (H) 6.5 - 8.1 g/dL   Albumin 3.6 3.5 - 5.0 g/dL   AST 34 15 - 41 U/L   ALT 27 0 - 44 U/L   Alkaline Phosphatase 101 38 - 126 U/L   Total Bilirubin 0.6 0.3 - 1.2 mg/dL   GFR, Estimated >60 >60 mL/min    Comment: (NOTE) Calculated using the CKD-EPI Creatinine Equation (2021)    Anion gap 11 5 - 15    Comment: Performed at [facility], [place] [address], [place], [place] [address]  CBC     Status: Abnormal   Collection Time: 11/03/20  5:14 PM  Result Value Ref Range   WBC 17.7 (H) 4.0 - 10.5 K/uL   RBC 4.56 4.22 - 5.81 MIL/uL   Hemoglobin 13.5 13.0 - 17.0 g/dL   HCT 40.4 39.0 - 52.0 %  MCV 88.6 80.0 - 100.0 fL   MCH 29.6 26.0 - 34.0 pg   MCHC 33.4 30.0 - 36.0 g/dL   RDW 12.7 11.5 - 15.5 %   Platelets 345 150 - 400 K/uL   nRBC 0.0 0.0 - 0.2 %    Comment: Performed at [facility], [place] [address], [place], [place] [address]  Procalcitonin     Status: None   Collection Time: 11/03/20  5:14 PM  Result Value Ref Range   Procalcitonin 0.31 ng/mL    Comment:        Interpretation: PCT (Procalcitonin) <= 0.5 ng/mL: Systemic infection (sepsis) is not likely. Local bacterial infection is possible. (NOTE)       Sepsis PCT Algorithm           Lower Respiratory Tract                                      Infection PCT Algorithm    ----------------------------     ----------------------------         PCT < 0.25 ng/mL                PCT < 0.10 ng/mL          Strongly encourage             Strongly discourage   discontinuation of antibiotics    initiation of antibiotics    ----------------------------      -----------------------------       PCT 0.25 - 0.50 ng/mL            PCT 0.10 - 0.25 ng/mL               OR       >80% decrease in PCT            Discourage initiation of                                            antibiotics      Encourage discontinuation           of antibiotics    ----------------------------     -----------------------------         PCT >= 0.50 ng/mL              PCT 0.26 - 0.50 ng/mL               AND        <80% decrease in PCT             Encourage initiation of                                             antibiotics       Encourage continuation           of antibiotics    ----------------------------     -----------------------------        PCT >= 0.50 ng/mL                  PCT > 0.50 ng/mL               AND  increase in PCT                  Strongly encourage                                      initiation of antibiotics    Strongly encourage escalation           of antibiotics                                     -----------------------------                                           PCT <= 0.25 ng/mL                                                 OR                                        > 80% decrease in PCT                                      Discontinue / Do not initiate                                             antibiotics  Performed at [place] [address], [place], [place] [address]   Resp Panel by RT-PCR (Flu A&B, Covid) Nasopharyngeal Swab     Status: None   Collection Time: 11/03/20  8:03 PM   Specimen: Nasopharyngeal Swab; Nasopharyngeal(NP) swabs in vial transport medium  Result Value Ref Range   SARS Coronavirus 2 by RT PCR NEGATIVE NEGATIVE    Comment: (NOTE) SARS-CoV-2 target nucleic acids are NOT DETECTED.  The SARS-CoV-2 RNA is generally detectable in upper respiratory specimens during the acute phase of infection. The lowest concentration of SARS-CoV-2 viral copies this assay can detect  is 138 copies/mL. A negative result does not preclude SARS-Cov-2 infection and should not be used as the sole basis for treatment or other patient management decisions. A negative result may occur with  improper specimen collection/handling, submission of specimen other than nasopharyngeal swab, presence of viral mutation(s) within the areas targeted by this assay, and inadequate number of viral copies(<138 copies/mL). A negative result must be combined with clinical observations, patient history, and epidemiological information. The expected result is Negative.  Fact Sheet for Patients:  EntrepreneurPulse.com.au  Fact Sheet for Healthcare Providers:  IncredibleEmployment.be  This test is no t yet approved or cleared by the [place] FDA and  has been authorized for detection and/or diagnosis of SARS-CoV-2 by FDA under an Emergency Use Authorization (EUA). This EUA will remain  in effect (meaning this test can be used) for the duration of the COVID-19 declaration under  Section 564(b)(1) of the Act, 21 U.S.C.section 360bbb-3(b)(1), unless the authorization is terminated  or revoked sooner.       Influenza A by PCR NEGATIVE NEGATIVE   Influenza B by PCR NEGATIVE NEGATIVE    Comment: (NOTE) The Xpert Xpress SARS-CoV-2/FLU/RSV plus assay is intended as an aid in the diagnosis of influenza from Nasopharyngeal swab specimens and should not be used as a sole basis for treatment. Nasal washings and aspirates are unacceptable for Xpert Xpress SARS-CoV-2/FLU/RSV testing.  Fact Sheet for Patients: EntrepreneurPulse.com.au  Fact Sheet for Healthcare Providers: IncredibleEmployment.be  This test is not yet approved or cleared by the [place] FDA and has been authorized for detection and/or diagnosis of SARS-CoV-2 by FDA under an Emergency Use Authorization (EUA). This EUA will remain in effect (meaning this  test can be used) for the duration of the COVID-19 declaration under Section 564(b)(1) of the Act, 21 U.S.C. section 360bbb-3(b)(1), unless the authorization is terminated or revoked.  Performed at [facility], [place] [address], [place], [place] [address]   Lactic acid, plasma     Status: None   Collection Time: 11/03/20  9:30 PM  Result Value Ref Range   Lactic Acid, Venous 0.9 0.5 - 1.9 mmol/L    Comment: Performed at [facility], [place], [place] [address] [place], [place] [address]  MRSA PCR Screening     Status: None   Collection Time: 11/04/20 12:13 AM   Specimen: Nasopharyngeal  Result Value Ref Range   MRSA by PCR NEGATIVE NEGATIVE    Comment:        The GeneXpert MRSA Assay (FDA approved for NASAL specimens only), is one component of a comprehensive MRSA colonization surveillance program. It is not intended to diagnose MRSA infection nor to guide or monitor treatment for MRSA infections. Performed at [facility], [place] [address] [place], [place] [address]   Lactic acid, plasma     Status: None   Collection Time: 11/04/20  1:03 AM  Result Value Ref Range   Lactic Acid, Venous 0.8 0.5 - 1.9 mmol/L    Comment: Performed at [facility], [place] [address], [place], [place] [address]  Urinalysis, Routine w reflex microscopic     Status: None   Collection Time: 11/04/20  2:30 AM  Result Value Ref Range   Color, Urine YELLOW YELLOW   APPearance CLEAR CLEAR   Specific Gravity, Urine 1.018 1.005 - 1.030   pH 5.0 5.0 - 8.0   Glucose, UA NEGATIVE NEGATIVE mg/dL   Hgb urine dipstick NEGATIVE NEGATIVE   Bilirubin Urine NEGATIVE NEGATIVE   Ketones, ur NEGATIVE NEGATIVE mg/dL   Protein, ur NEGATIVE NEGATIVE mg/dL   Nitrite NEGATIVE NEGATIVE   Leukocytes,Ua NEGATIVE NEGATIVE    Comment: Performed at [place] [address] [place], [place] [address]  Magnesium     Status: None    Collection Time: 11/04/20  3:30 AM  Result Value Ref Range   Magnesium 2.2 1.7 - 2.4 mg/dL    Comment: Performed at [facility], [place] [address], [place], [place] [address]  Phosphorus     Status: None   Collection Time: 11/04/20  3:30 AM  Result Value Ref Range   Phosphorus 3.7 2.5 - 4.6 mg/dL    Comment: Performed at [facility], [place] [address] [place], [place] [address]  CBC WITH DIFFERENTIAL     Status: Abnormal   Collection Time: 11/04/20  3:30 AM  Result Value Ref Range   WBC 9.8 4.0 - 10.5 K/uL   RBC 3.58 (L) 4.22 - 5.81 MIL/uL   Hemoglobin 10.6 (L) 13.0 - 17.0 g/dL   HCT 32.5 (L) 39.0 - 52.0 %   MCV 90.8 80.0 - 100.0 fL   MCH 29.6 26.0 - 34.0 pg   MCHC 32.6 30.0 - 36.0 g/dL   RDW 12.9 11.5 - 15.5 %   Platelets 235 150 - 400 K/uL   nRBC 0.0 0.0 - 0.2 %   Neutrophils Relative % 70 %   Neutro Abs 6.9 1.7 - 7.7 K/uL   Lymphocytes Relative 18 %   Lymphs Abs 1.7 0.7 - 4.0 K/uL   Monocytes Relative 9 %   Monocytes Absolute 0.9 0.1 - 1.0 K/uL   Eosinophils Relative 1 %   Eosinophils Absolute 0.1 0.0 - 0.5 K/uL   Basophils Relative 1 %   Basophils Absolute 0.1 0.0 - 0.1 K/uL   Immature Granulocytes 1 %   Abs Immature Granulocytes 0.07 0.00 - 0.07 K/uL    Comment: Performed at [facility], [place] [address], [place], [place] [address]  TSH     Status: None   Collection Time: 11/04/20  3:30 AM  Result Value Ref Range   TSH 1.752 0.350 - 4.500 uIU/mL    Comment: Performed by a 3rd Generation assay with a functional sensitivity of <=0.01 uIU/mL. Performed at [facility], [place] [address], [place], [place] [address]   Comprehensive metabolic panel     Status: Abnormal   Collection Time: 11/04/20  3:30 AM  Result Value Ref Range   Sodium 137 135 - 145 mmol/L   Potassium 3.2 (L) 3.5 - 5.1 mmol/L   Chloride 107 98 - 111 mmol/L   CO2 22 22 - 32 mmol/L   Glucose, Bld 96 70 - 99 mg/dL    Comment: Glucose  reference range applies only to samples taken after fasting for at least 8 hours.   BUN 14 6 - 20 mg/dL   Creatinine, Ser 0.63 0.61 - 1.24 mg/dL   Calcium 8.4 (L) 8.9 - 10.3 mg/dL   Total Protein 5.8 (L) 6.5 - 8.1 g/dL   Albumin 2.7 (L) 3.5 - 5.0 g/dL   AST 16 15 - 41 U/L   ALT 16 0 - 44 U/L   Alkaline Phosphatase 70 38 - 126 U/L   Total Bilirubin 0.6 0.3 - 1.2 mg/dL   GFR, Estimated >60 >60 mL/min    Comment: (NOTE) Calculated using the CKD-EPI Creatinine Equation (2021)    Anion gap 8 5 - 15    Comment: Performed at [facility], [place] [address], [place], [place] [address]    CT ABDOMEN PELVIS WO CONTRAST  Result Date: 11/03/2020 CLINICAL DATA:  Abdominal pain and fever. Perirectal abscess. Patient reports intermittent lower abdominal pain for 3 months. Rectal pain for 3-4 days. EXAM: CT ABDOMEN AND PELVIS WITHOUT CONTRAST TECHNIQUE: Multidetector CT imaging of the abdomen and pelvis was performed following the standard protocol without IV contrast. COMPARISON:  None. FINDINGS: Lower chest: Clear lung bases. Hepatobiliary: No focal liver abnormality is seen. No gallstones, gallbladder wall thickening, or biliary dilatation. Pancreas: Unremarkable. No pancreatic ductal dilatation or surrounding inflammatory changes. Spleen: Normal in size without focal abnormality. Adrenals/Urinary Tract: No adrenal masses. Kidneys normal size, orientation and position. Vague low-attenuation lesion from the medial upper pole of the right kidney, proximally 9 mm in size, not fully characterized, but likely a  cyst. No other renal masses or lesions, no stones and no hydronephrosis. Normal ureters. Normal bladder. Stomach/Bowel: Eccentric thickening of the inferior rectal wall. Middle and upper rectum are unremarkable. Colon and small bowel are normal in caliber. No wall thickening. No inflammation. Normal appearance of the stomach. No evidence of appendicitis. Vascular/Lymphatic: Aortic  atherosclerosis. No aneurysm. Prominent right inguinal lymph nodes, largest measuring 1 cm short axis. No other adenopathy. Reproductive: Prostate is unremarkable. Other: There is soft tissue air and inflammation along the medial aspect of the right buttock and right perineum with ill-defined soft tissue surrounding anus. Inflammation extends to the inferior rectum just above the levator sling. There is no defined fluid collection to suggest an abscess. No ascites. Musculoskeletal: No fracture or acute finding. No bone lesion. No bone resorption to suggest osteomyelitis. IMPRESSION: 1. Perirectal/perianal infection reflected by soft tissue inflammation and soft tissue air, along with thickening of the inferior rectal wall. Mild inflammatory change extends just above the levator sling adjacent to the inferior rectum. There is no fluid collection to suggest an abscess, however. There are prominent right inguinal lymph nodes, presumed reactive to the perineal inflammation. 2. No other acute abnormality within the abdomen or pelvis. 3. Aortic atherosclerosis. Electronically Signed   By: Lajean Manes M.D.   On: 11/03/2020 18:33    Review of Systems  Constitutional: Negative.   HENT: Negative.   Respiratory: Negative.   Cardiovascular: Negative.   Gastrointestinal: Positive for rectal pain.  Genitourinary: Negative.   Musculoskeletal: Negative.   Psychiatric/Behavioral: Negative.   All other systems reviewed and are negative.  Blood pressure (!) 94/58, pulse 77, temperature 98.8 F (37.1 C), resp. rate 16, height 6' (1.829 m), weight 70.5 kg, SpO2 97 %. Physical Exam Vitals reviewed.  Constitutional:      Appearance: He is well-developed.  HENT:     Head: Normocephalic and atraumatic.  Cardiovascular:     Rate and Rhythm: Normal rate.  Pulmonary:     Effort: Pulmonary effort is normal.  Abdominal:     General: Abdomen is flat.     Palpations: Abdomen is soft.     Tenderness: There is no  abdominal tenderness.  Genitourinary:   Neurological:     General: No focal deficit present.     Mental Status: He is alert.     Assessment/Plan: Proctitis/spontaneously drained perirectal abscess with possible fistula tract  Patient is decompressed.  No need for emergent surgical care at this point.  He appears well drained.  Continue IV antibiotics and can transition to p.o. Augmentin 875 twice daily for discharge hopefully IN  the next 24 hours depending on how he does.  He needs follow-up with a colorectal surgeon at [place] in 2 weeks.  Sitz bath may be helpful for pain control.  Noella Kipnis A Zorana Brockwell md  11/04/2020, 8:01 AM

## 2020-11-04 NOTE — Plan of Care (Signed)
Plan of care discussed. Encouraged to ask questions. Denies need for an interpreter.  Informed there are no-pork options on menu. Verbalized understanding to report changes in condition.

## 2020-11-04 NOTE — Consult Note (Signed)
Referring Provider: [facility] Primary Care Physician:  Patient, No Pcp Per (Inactive) Primary Gastroenterologist: None (unassigned)  Reason for Consultation: Questionable proctitis and perirectal abscess  HPI: James Coffey is a 50 y.o. male from [place], in this country for the past 75 years, works as a Administrator, no antecedent GI symptoms other than occasional constipation, presented to the emergency room with a several day history of perianal discomfort and found to have a spontaneously drained rectal abscess, CT showing regional inflammation without a discrete residual abscess, also raising the question of proctitis although the patient has no antecedent symptoms of rectal bleeding or tenesmus.  No prior history of this sort of problem.   History reviewed. No pertinent past medical history.  Past Surgical History:  Procedure Laterality Date  . HERNIA REPAIR    . INGUINAL HERNIA REPAIR Right 2007    Prior to Admission medications   Medication Sig Start Date End Date Taking? Authorizing Provider  ibuprofen (ADVIL) 200 MG tablet Take 200 mg by mouth every 6 (six) hours as needed for mild pain.   Yes [provider]    Current Facility-Administered Medications  Medication Dose Route Frequency Provider Last Rate Last Admin  . 0.9 %  sodium chloride infusion   Intravenous PRN Domenic Polite, MD      . 0.9 %  sodium chloride infusion  1,000 mL Intravenous Continuous Domenic Polite, MD      . acetaminophen (TYLENOL) tablet 650 mg  650 mg Oral Q6H PRN Toy Baker, MD       Or  . acetaminophen (TYLENOL) suppository 650 mg  650 mg Rectal Q6H PRN Doutova, Anastassia, MD      . feeding supplement (ENSURE ENLIVE / ENSURE PLUS) liquid 237 mL  237 mL Oral BID BM Doutova, Anastassia, MD   237 mL at 11/04/20 0924  . HYDROcodone-acetaminophen (NORCO/VICODIN) 5-325 MG per tablet 1-2 tablet  1-2 tablet Oral Q4H PRN Toy Baker, MD   1 tablet at 11/03/20 2346  .  naproxen (NAPROSYN) tablet 250 mg  250 mg Oral BID WC Domenic Polite, MD      . nicotine (NICODERM CQ - dosed in mg/24 hours) patch 21 mg  21 mg Transdermal Daily Doutova, Nyoka Lint, MD   21 mg at 11/03/20 2348  . piperacillin-tazobactam (ZOSYN) IVPB 3.375 g  3.375 g Intravenous Q8H Adrian Saran, RPH 12.5 mL/hr at 11/04/20 1224 3.375 g at 11/04/20 1224  . potassium chloride SA (KLOR-CON) CR tablet 40 mEq  40 mEq Oral Once Domenic Polite, MD        Allergies as of 11/03/2020  . (No Known Allergies)    Family History  Problem Relation Age of Onset  . CAD Neg Hx   . Inflammatory bowel disease Neg Hx     Social History   Socioeconomic History  . Marital status: Married    Spouse name: Not on file  . Number of children: Not on file  . Years of education: Not on file  . Highest education level: Not on file  Occupational History  . Occupation: truck Geophysicist/field seismologist  Tobacco Use  . Smoking status: Current Every Day Smoker    Packs/day: 1.00    Types: Cigarettes  . Smokeless tobacco: Never Used  Vaping Use  . Vaping Use: Never used  Substance and Sexual Activity  . Alcohol use: No  . Drug use: No  . Sexual activity: Not on file  Other Topics Concern  . Not on file  Social History  Narrative   From [place].  Came to the [place] in 2000.   Social Determinants of Health   Financial Resource Strain: Not on file  Food Insecurity: Not on file  Transportation Needs: Not on file  Physical Activity: Not on file  Stress: Not on file  Social Connections: Not on file  Intimate Partner Violence: Not on file      Physical Exam: Vital signs in last 24 hours: Temp:  [98.1 F (36.7 C)-99 F (37.2 C)] 98.3 F (36.8 C) (05/30 1222) Pulse Rate:  [77-106] 84 (05/30 1222) Resp:  [16-20] 20 (05/30 1222) BP: (81-120)/(56-84) 96/67 (05/30 1222) SpO2:  [96 %-100 %] 98 % (05/30 1222) Weight:  [69.6 kg-70.5 kg] 70.5 kg (05/30 0345) Last BM Date: 11/02/20  Pleasant, very healthy appearing  well-developed middle-age male, English skills very good.  Anicteric, no evident pallor.  Chest and heart unremarkable, abdomen has a rather prominent well-healed right inguinal hernia scar, but is without mass-effect or tenderness.  Perianal exam shows significant induration in the right gluteal cheek several centimeters lateral to the anus, with draining mucoid material.  Digital rectal exam not performed.  Intake/Output from previous day: 05/29 0701 - 05/30 0700 In: 3402.7 [P.O.:960; I.V.:1413.7; IV Piggyback:1029] Out: 300 [Urine:300] Intake/Output this shift: No intake/output data recorded.  Lab Results: Recent Labs    11/03/20 1714 11/04/20 0330  WBC 17.7* 9.8  HGB 13.5 10.6*  HCT 40.4 32.5*  PLT 345 235   BMET Recent Labs    11/03/20 1714 11/04/20 0330  NA 136 137  K 3.7 3.2*  CL 101 107  CO2 24 22  GLUCOSE 122* 96  BUN 20 14  CREATININE 0.83 0.63  CALCIUM 9.4 8.4*   LFT Recent Labs    11/04/20 0330  PROT 5.8*  ALBUMIN 2.7*  AST 16  ALT 16  ALKPHOS 70  BILITOT 0.6   PT/INR No results for input(s): LABPROT, INR in the last 72 hours.  Studies/Results: CT ABDOMEN PELVIS WO CONTRAST  Result Date: 11/03/2020 CLINICAL DATA:  Abdominal pain and fever. Perirectal abscess. Patient reports intermittent lower abdominal pain for 3 months. Rectal pain for 3-4 days. EXAM: CT ABDOMEN AND PELVIS WITHOUT CONTRAST TECHNIQUE: Multidetector CT imaging of the abdomen and pelvis was performed following the standard protocol without IV contrast. COMPARISON:  None. FINDINGS: Lower chest: Clear lung bases. Hepatobiliary: No focal liver abnormality is seen. No gallstones, gallbladder wall thickening, or biliary dilatation. Pancreas: Unremarkable. No pancreatic ductal dilatation or surrounding inflammatory changes. Spleen: Normal in size without focal abnormality. Adrenals/Urinary Tract: No adrenal masses. Kidneys normal size, orientation and position. Vague low-attenuation lesion from  the medial upper pole of the right kidney, proximally 9 mm in size, not fully characterized, but likely a cyst. No other renal masses or lesions, no stones and no hydronephrosis. Normal ureters. Normal bladder. Stomach/Bowel: Eccentric thickening of the inferior rectal wall. Middle and upper rectum are unremarkable. Colon and small bowel are normal in caliber. No wall thickening. No inflammation. Normal appearance of the stomach. No evidence of appendicitis. Vascular/Lymphatic: Aortic atherosclerosis. No aneurysm. Prominent right inguinal lymph nodes, largest measuring 1 cm short axis. No other adenopathy. Reproductive: Prostate is unremarkable. Other: There is soft tissue air and inflammation along the medial aspect of the right buttock and right perineum with ill-defined soft tissue surrounding anus. Inflammation extends to the inferior rectum just above the levator sling. There is no defined fluid collection to suggest an abscess. No ascites. Musculoskeletal: No fracture or acute finding.  No bone lesion. No bone resorption to suggest osteomyelitis. IMPRESSION: 1. Perirectal/perianal infection reflected by soft tissue inflammation and soft tissue air, along with thickening of the inferior rectal wall. Mild inflammatory change extends just above the levator sling adjacent to the inferior rectum. There is no fluid collection to suggest an abscess, however. There are prominent right inguinal lymph nodes, presumed reactive to the perineal inflammation. 2. No other acute abnormality within the abdomen or pelvis. 3. Aortic atherosclerosis. Electronically Signed   By: Lajean Manes M.D.   On: 11/03/2020 18:33    Impression: This appears to be a primary perirectal abscess with spontaneous drainage, without likely causation from any form of inflammatory bowel disease.  Plan: 1.  Concerning the perirectal abscess, I discussed the case with Dr. Christie Beckers, the surgical consultant.  He does not feel that proctoscopy  or sigmoidoscopic evaluation is needed while in the hospital.  The patient will be having follow-up with the colorectal surgeon, and will likely be getting an exam under anesthesia.  2.  The patient is age 69 and has never had colon cancer screening.  I suggested consideration of a colonoscopy after the inflammation and discomfort associated with his perirectal abscess have resolved.  I also discussed the option of less invasive colon cancer screening methods.  He does not currently have a primary physician, so I gave him my card so that he is welcome to call our office to arrange this.  3.  I will sign off.  Please call us if further input from [place] is desired.   LOS: 1 day   [place]  11/04/2020, 12:52 PM   Pager [phone] If no answer or after 5 PM call [phone]

## 2020-11-04 NOTE — Progress Notes (Signed)
PROGRESS NOTE    James Coffey  MVE:[id number] DOB: 1970-08-12 DOA: 11/03/2020 PCP: Patient, No Pcp Per (Inactive)  Brief Narrative: 50 year old male with no significant past medical history, cigarette smoker presented to the ED with perirectal pain for 3 to 4 days associated with fevers and chills.  In addition also reported lower abdominal discomfort intermittent and ongoing for 6 months which appears to have improved now. CT abdomen pelvis concerning for perirectal/perianal infection  Assessment & Plan:   Perirectal abscess -Spontaneously draining purulent material -Continue IV Zosyn, follow-up HIV -Appreciate general surgical input -Add NSAIDs, sitz bath -Etiology remains unclear, follow-up with colorectal surgeon/CCS in few weeks after discharge  Tobacco abuse -Counseled  DVT prophylaxis: SCDs Code Status: Full code Family Communication: No family at bedside Disposition Plan:  Status is: Inpatient  Remains inpatient appropriate because:Inpatient level of care appropriate due to severity of illness   Dispo: The patient is from: Home              Anticipated d/c is to: Home              Patient currently is not medically stable to d/c.   Difficult to place patient No   Consultants:   General surgery   Procedures:   Antimicrobials:    Subjective: -Feels okay, has some discomfort/perirectal pain  Objective: Vitals:   11/04/20 0612 11/04/20 0631 11/04/20 0825 11/04/20 1031  BP: (!) 89/57 (!) 94/58 97/63 (!) 89/62  Pulse: 77  81 91  Resp: 16  20 20   Temp: 98.8 F (37.1 C)  98.2 F (36.8 C) 98.3 F (36.8 C)  TempSrc:   Oral Oral  SpO2: 97%  98% 98%  Weight:      Height:        Intake/Output Summary (Last 24 hours) at 11/04/2020 1113 Last data filed at 11/04/2020 0600 Gross per 24 hour  Intake 3402.72 ml  Output 300 ml  Net 3102.72 ml   Filed Weights   11/03/20 2142 11/04/20 0345  Weight: 69.6 kg 70.5 kg    Examination:  General exam: Middle-age  male lying in bed, AAOx3, nondistressed CVS: S1-S2, regular rate rhythm Lungs: Clear bilaterally Abdomen: Soft, nontender, bowel sounds present Extremities: No edema GU : Moderate sized perirectal abscess draining pus, surrounding tenderness and fluctuation Psychiatry:  Mood & affect appropriate.   Data Reviewed:   CBC: Recent Labs  Lab 11/03/20 1714 11/04/20 0330  WBC 17.7* 9.8  NEUTROABS  --  6.9  HGB 13.5 10.6*  HCT 40.4 32.5*  MCV 88.6 90.8  PLT 345 283   Basic Metabolic Panel: Recent Labs  Lab 11/03/20 1714 11/04/20 0330  NA 136 137  K 3.7 3.2*  CL 101 107  CO2 24 22  GLUCOSE 122* 96  BUN 20 14  CREATININE 0.83 0.63  CALCIUM 9.4 8.4*  MG  --  2.2  PHOS  --  3.7   GFR: Estimated Creatinine Clearance: 108.9 mL/min (by C-G formula based on SCr of 0.63 mg/dL). Liver Function Tests: Recent Labs  Lab 11/03/20 1714 11/04/20 0330  AST 34 16  ALT 27 16  ALKPHOS 101 70  BILITOT 0.6 0.6  PROT 8.4* 5.8*  ALBUMIN 3.6 2.7*   Recent Labs  Lab 11/03/20 1714  LIPASE 22   No results for input(s): AMMONIA in the last 168 hours. Coagulation Profile: No results for input(s): INR, PROTIME in the last 168 hours. Cardiac Enzymes: No results for input(s): CKTOTAL, CKMB, CKMBINDEX, TROPONINI in the last 168  hours. BNP (last 3 results) No results for input(s): PROBNP in the last 8760 hours. HbA1C: No results for input(s): HGBA1C in the last 72 hours. CBG: No results for input(s): GLUCAP in the last 168 hours. Lipid Profile: No results for input(s): CHOL, HDL, LDLCALC, TRIG, CHOLHDL, LDLDIRECT in the last 72 hours. Thyroid Function Tests: Recent Labs    11/04/20 0330  TSH 1.752   Anemia Panel: No results for input(s): VITAMINB12, FOLATE, FERRITIN, TIBC, IRON, RETICCTPCT in the last 72 hours. Urine analysis:    Component Value Date/Time   COLORURINE YELLOW 11/04/2020 0230   APPEARANCEUR CLEAR 11/04/2020 0230   LABSPEC 1.018 11/04/2020 0230   PHURINE 5.0  11/04/2020 0230   GLUCOSEU NEGATIVE 11/04/2020 0230   HGBUR NEGATIVE 11/04/2020 0230   BILIRUBINUR NEGATIVE 11/04/2020 0230   KETONESUR NEGATIVE 11/04/2020 0230   PROTEINUR NEGATIVE 11/04/2020 0230   NITRITE NEGATIVE 11/04/2020 0230   LEUKOCYTESUR NEGATIVE 11/04/2020 0230   Sepsis Labs: @LABRCNTIP (procalcitonin:4,lacticidven:4)  ) Recent Results (from the past 240 hour(s))  Resp Panel by RT-PCR (Flu A&B, Covid) Nasopharyngeal Swab     Status: None   Collection Time: 11/03/20  8:03 PM   Specimen: Nasopharyngeal Swab; Nasopharyngeal(NP) swabs in vial transport medium  Result Value Ref Range Status   SARS Coronavirus 2 by RT PCR NEGATIVE NEGATIVE Final    Comment: (NOTE) SARS-CoV-2 target nucleic acids are NOT DETECTED.  The SARS-CoV-2 RNA is generally detectable in upper respiratory specimens during the acute phase of infection. The lowest concentration of SARS-CoV-2 viral copies this assay can detect is 138 copies/mL. A negative result does not preclude SARS-Cov-2 infection and should not be used as the sole basis for treatment or other patient management decisions. A negative result may occur with  improper specimen collection/handling, submission of specimen other than nasopharyngeal swab, presence of viral mutation(s) within the areas targeted by this assay, and inadequate number of viral copies(<138 copies/mL). A negative result must be combined with clinical observations, patient history, and epidemiological information. The expected result is Negative.  Fact Sheet for Patients:  EntrepreneurPulse.com.au  Fact Sheet for Healthcare Providers:  IncredibleEmployment.be  This test is no t yet approved or cleared by the [place] FDA and  has been authorized for detection and/or diagnosis of SARS-CoV-2 by FDA under an Emergency Use Authorization (EUA). This EUA will remain  in effect (meaning this test can be used) for the duration of  the COVID-19 declaration under Section 564(b)(1) of the Act, 21 U.S.C.section 360bbb-3(b)(1), unless the authorization is terminated  or revoked sooner.       Influenza A by PCR NEGATIVE NEGATIVE Final   Influenza B by PCR NEGATIVE NEGATIVE Final    Comment: (NOTE) The Xpert Xpress SARS-CoV-2/FLU/RSV plus assay is intended as an aid in the diagnosis of influenza from Nasopharyngeal swab specimens and should not be used as a sole basis for treatment. Nasal washings and aspirates are unacceptable for Xpert Xpress SARS-CoV-2/FLU/RSV testing.  Fact Sheet for Patients: EntrepreneurPulse.com.au  Fact Sheet for Healthcare Providers: IncredibleEmployment.be  This test is not yet approved or cleared by the [place] FDA and has been authorized for detection and/or diagnosis of SARS-CoV-2 by FDA under an Emergency Use Authorization (EUA). This EUA will remain in effect (meaning this test can be used) for the duration of the COVID-19 declaration under Section 564(b)(1) of the Act, 21 U.S.C. section 360bbb-3(b)(1), unless the authorization is terminated or revoked.  Performed at [facility], [place] [address], [place], [place] [address]  MRSA PCR Screening     Status: None   Collection Time: 11/04/20 12:13 AM   Specimen: Nasopharyngeal  Result Value Ref Range Status   MRSA by PCR NEGATIVE NEGATIVE Final    Comment:        The GeneXpert MRSA Assay (FDA approved for NASAL specimens only), is one component of a comprehensive MRSA colonization surveillance program. It is not intended to diagnose MRSA infection nor to guide or monitor treatment for MRSA infections. Performed at [facility], [place] [address], [place], [place] [address]          Radiology Studies: CT ABDOMEN PELVIS WO CONTRAST  Result Date: 11/03/2020 CLINICAL DATA:  Abdominal pain and fever. Perirectal abscess. Patient reports  intermittent lower abdominal pain for 3 months. Rectal pain for 3-4 days. EXAM: CT ABDOMEN AND PELVIS WITHOUT CONTRAST TECHNIQUE: Multidetector CT imaging of the abdomen and pelvis was performed following the standard protocol without IV contrast. COMPARISON:  None. FINDINGS: Lower chest: Clear lung bases. Hepatobiliary: No focal liver abnormality is seen. No gallstones, gallbladder wall thickening, or biliary dilatation. Pancreas: Unremarkable. No pancreatic ductal dilatation or surrounding inflammatory changes. Spleen: Normal in size without focal abnormality. Adrenals/Urinary Tract: No adrenal masses. Kidneys normal size, orientation and position. Vague low-attenuation lesion from the medial upper pole of the right kidney, proximally 9 mm in size, not fully characterized, but likely a cyst. No other renal masses or lesions, no stones and no hydronephrosis. Normal ureters. Normal bladder. Stomach/Bowel: Eccentric thickening of the inferior rectal wall. Middle and upper rectum are unremarkable. Colon and small bowel are normal in caliber. No wall thickening. No inflammation. Normal appearance of the stomach. No evidence of appendicitis. Vascular/Lymphatic: Aortic atherosclerosis. No aneurysm. Prominent right inguinal lymph nodes, largest measuring 1 cm short axis. No other adenopathy. Reproductive: Prostate is unremarkable. Other: There is soft tissue air and inflammation along the medial aspect of the right buttock and right perineum with ill-defined soft tissue surrounding anus. Inflammation extends to the inferior rectum just above the levator sling. There is no defined fluid collection to suggest an abscess. No ascites. Musculoskeletal: No fracture or acute finding. No bone lesion. No bone resorption to suggest osteomyelitis. IMPRESSION: 1. Perirectal/perianal infection reflected by soft tissue inflammation and soft tissue air, along with thickening of the inferior rectal wall. Mild inflammatory change extends  just above the levator sling adjacent to the inferior rectum. There is no fluid collection to suggest an abscess, however. There are prominent right inguinal lymph nodes, presumed reactive to the perineal inflammation. 2. No other acute abnormality within the abdomen or pelvis. 3. Aortic atherosclerosis. Electronically Signed   By: Lajean Manes M.D.   On: 11/03/2020 18:33        Scheduled Meds: . feeding supplement  237 mL Oral BID BM  . nicotine  21 mg Transdermal Daily   Continuous Infusions: . sodium chloride 1,000 mL (11/03/20 1907)  . sodium chloride    . piperacillin-tazobactam (ZOSYN)  IV 3.375 g (11/04/20 0340)     LOS: 1 day    Time spent: 19min  Domenic Polite, MD [facility]   11/04/2020, 11:13 AM

## 2020-11-05 DIAGNOSIS — K610 Anal abscess: Secondary | ICD-10-CM

## 2020-11-05 LAB — HEMOGLOBIN A1C
Hgb A1c MFr Bld: 5.6 % (ref 4.8–5.6)
Mean Plasma Glucose: 114 mg/dL

## 2020-11-05 MED ORDER — DOCUSATE SODIUM 100 MG PO CAPS: 100.0000 mg | ORAL_CAPSULE | Freq: Two times a day (BID) | ORAL | 2 refills | Status: AC

## 2020-11-05 MED ORDER — ADULT MULTIVITAMIN W/MINERALS CH
1.0000 | ORAL_TABLET | Freq: Every day | ORAL | Status: DC
  Administered 2020-11-05: 1 via ORAL
  Filled 2020-11-05: qty 1

## 2020-11-05 MED ORDER — AMOXICILLIN-POT CLAVULANATE 875-125 MG PO TABS: 1.0000 | ORAL_TABLET | Freq: Two times a day (BID) | ORAL | 0 refills | Status: AC

## 2020-11-05 MED ORDER — ACETAMINOPHEN 325 MG PO TABS: 650.0000 mg | ORAL_TABLET | Freq: Four times a day (QID) | ORAL | Status: DC | PRN

## 2020-11-05 NOTE — Discharge Summary (Signed)
Physician Discharge Summary  James Coffey RUE:[id number] DOB: Aug 09, 1970 DOA: 11/03/2020  PCP: Patient, No Pcp Per (Inactive)  Admit date: 11/03/2020 Discharge date: 11/05/2020  Time spent: 45 minutes  Recommendations for Outpatient Follow-up:  1. General surgery Dr. [name] in 2 to 3 weeks 2. Gastroenterology Dr. Cristina Gong in 1 month   Discharge Diagnoses:  Active Problems:   Perirectal abscess   Cellulitis   Proctitis Borderline diabetes   Tobacco abuse   Perianal abscess   Discharge Condition: Stable  Diet recommendation: Carb modified  Filed Weights   11/03/20 2142 11/04/20 0345 11/05/20 0500  Weight: 69.6 kg 70.5 kg 69.4 kg    History of present illness:  50 year old male with no significant past medical history, cigarette smoker presented to the ED with perirectal pain for 3 to 4 days associated with fevers and chills.  In addition also reported lower abdominal discomfort intermittent and ongoing for 6 months which appears to have improved now. CT abdomen pelvis concerning for perirectal/perianal infection  Hospital Course:   Perirectal abscess -Spontaneously draining purulent material -Treated with IV Zosyn, NSAIDs , clinically improving  -Seen by general surgery in consultation, recommended antibiotics at discharge and close follow-up with colorectal surgeon/Dr. [name] in 2 to 3 weeks  -Discharged home on Augmentin, Colace, sitz bath recommended  -Will need a colonoscopy in the next few months as well, appreciate input from Dr. Cristina Gong, follow-up with him  Mild hyperglycemia Borderline diabetes -Hemoglobin A1c was 5.7  Tobacco abuse -Counseled  Discharge Exam: Vitals:   11/05/20 0420 11/05/20 0637  BP: 90/63 92/62  Pulse: 77 74  Resp: 18 16  Temp: 98.5 F (36.9 C) 97.7 F (36.5 C)  SpO2: 100% 98%    General: AAOx3 Cardiovascular: S1S2/RRR Respiratory: CTAB  Discharge Instructions   Discharge Instructions    Diet Carb Modified   Complete by:  As directed    Discharge wound care:   Complete by: As directed    Can leave wound open or cover with gauze 4x4   Increase activity slowly   Complete by: As directed      Allergies as of 11/05/2020   No Known Allergies     Medication List    TAKE these medications   acetaminophen 325 MG tablet Commonly known as: TYLENOL Take 2 tablets (650 mg total) by mouth every 6 (six) hours as needed for mild pain (or Fever >/= 101).   amoxicillin-clavulanate 875-125 MG tablet Commonly known as: Augmentin Take 1 tablet by mouth 2 (two) times daily for 10 days.   docusate sodium 100 MG capsule Commonly known as: Colace Take 1 capsule (100 mg total) by mouth 2 (two) times daily for 10 days.   ibuprofen 200 MG tablet Commonly known as: ADVIL Take 200 mg by mouth every 6 (six) hours as needed for mild pain.            Discharge Care Instructions  (From admission, onward)         Start     Ordered   11/05/20 0000  Discharge wound care:       Comments: Can leave wound open or cover with gauze 4x4   11/05/20 1316         No Known Allergies  Follow-up Information    Buccini, Robert, MD. Schedule an appointment as soon as possible for a visit in 1 month(s).   Specialty: Gastroenterology Contact information: [address] [place] [address] [phone]  [name], MD. Call in 3 week(s).   Specialties: General Surgery, Colon and Rectal Surgery Why: We are working on your appointment, call to confirm Please arrive 30 minutes prior to your appointment to check in and fill out paperwork. Bring photo ID and insurance information. Contact information: [place] [address] [phone]                The results of significant diagnostics from this hospitalization (including imaging, microbiology, ancillary and laboratory) are listed below for reference.    Significant Diagnostic Studies: CT ABDOMEN PELVIS WO  CONTRAST  Result Date: 11/03/2020 CLINICAL DATA:  Abdominal pain and fever. Perirectal abscess. Patient reports intermittent lower abdominal pain for 3 months. Rectal pain for 3-4 days. EXAM: CT ABDOMEN AND PELVIS WITHOUT CONTRAST TECHNIQUE: Multidetector CT imaging of the abdomen and pelvis was performed following the standard protocol without IV contrast. COMPARISON:  None. FINDINGS: Lower chest: Clear lung bases. Hepatobiliary: No focal liver abnormality is seen. No gallstones, gallbladder wall thickening, or biliary dilatation. Pancreas: Unremarkable. No pancreatic ductal dilatation or surrounding inflammatory changes. Spleen: Normal in size without focal abnormality. Adrenals/Urinary Tract: No adrenal masses. Kidneys normal size, orientation and position. Vague low-attenuation lesion from the medial upper pole of the right kidney, proximally 9 mm in size, not fully characterized, but likely a cyst. No other renal masses or lesions, no stones and no hydronephrosis. Normal ureters. Normal bladder. Stomach/Bowel: Eccentric thickening of the inferior rectal wall. Middle and upper rectum are unremarkable. Colon and small bowel are normal in caliber. No wall thickening. No inflammation. Normal appearance of the stomach. No evidence of appendicitis. Vascular/Lymphatic: Aortic atherosclerosis. No aneurysm. Prominent right inguinal lymph nodes, largest measuring 1 cm short axis. No other adenopathy. Reproductive: Prostate is unremarkable. Other: There is soft tissue air and inflammation along the medial aspect of the right buttock and right perineum with ill-defined soft tissue surrounding anus. Inflammation extends to the inferior rectum just above the levator sling. There is no defined fluid collection to suggest an abscess. No ascites. Musculoskeletal: No fracture or acute finding. No bone lesion. No bone resorption to suggest osteomyelitis. IMPRESSION: 1. Perirectal/perianal infection reflected by soft tissue  inflammation and soft tissue air, along with thickening of the inferior rectal wall. Mild inflammatory change extends just above the levator sling adjacent to the inferior rectum. There is no fluid collection to suggest an abscess, however. There are prominent right inguinal lymph nodes, presumed reactive to the perineal inflammation. 2. No other acute abnormality within the abdomen or pelvis. 3. Aortic atherosclerosis. Electronically Signed   By: Lajean Manes M.D.   On: 11/03/2020 18:33    Microbiology: Recent Results (from the past 240 hour(s))  Resp Panel by RT-PCR (Flu A&B, Covid) Nasopharyngeal Swab     Status: None   Collection Time: 11/03/20  8:03 PM   Specimen: Nasopharyngeal Swab; Nasopharyngeal(NP) swabs in vial transport medium  Result Value Ref Range Status   SARS Coronavirus 2 by RT PCR NEGATIVE NEGATIVE Final    Comment: (NOTE) SARS-CoV-2 target nucleic acids are NOT DETECTED.  The SARS-CoV-2 RNA is generally detectable in upper respiratory specimens during the acute phase of infection. The lowest concentration of SARS-CoV-2 viral copies this assay can detect is 138 copies/mL. A negative result does not preclude SARS-Cov-2 infection and should not be used as the sole basis for treatment or other patient management decisions. A negative result may occur with  improper specimen collection/handling, submission of specimen other  than nasopharyngeal swab, presence of viral mutation(s) within the areas targeted by this assay, and inadequate number of viral copies(<138 copies/mL). A negative result must be combined with clinical observations, patient history, and epidemiological information. The expected result is Negative.  Fact Sheet for Patients:  EntrepreneurPulse.com.au  Fact Sheet for Healthcare Providers:  IncredibleEmployment.be  This test is no t yet approved or cleared by the [place] FDA and  has been authorized for detection  and/or diagnosis of SARS-CoV-2 by FDA under an Emergency Use Authorization (EUA). This EUA will remain  in effect (meaning this test can be used) for the duration of the COVID-19 declaration under Section 564(b)(1) of the Act, 21 U.S.C.section 360bbb-3(b)(1), unless the authorization is terminated  or revoked sooner.       Influenza A by PCR NEGATIVE NEGATIVE Final   Influenza B by PCR NEGATIVE NEGATIVE Final    Comment: (NOTE) The Xpert Xpress SARS-CoV-2/FLU/RSV plus assay is intended as an aid in the diagnosis of influenza from Nasopharyngeal swab specimens and should not be used as a sole basis for treatment. Nasal washings and aspirates are unacceptable for Xpert Xpress SARS-CoV-2/FLU/RSV testing.  Fact Sheet for Patients: EntrepreneurPulse.com.au  Fact Sheet for Healthcare Providers: IncredibleEmployment.be  This test is not yet approved or cleared by the [place] FDA and has been authorized for detection and/or diagnosis of SARS-CoV-2 by FDA under an Emergency Use Authorization (EUA). This EUA will remain in effect (meaning this test can be used) for the duration of the COVID-19 declaration under Section 564(b)(1) of the Act, 21 U.S.C. section 360bbb-3(b)(1), unless the authorization is terminated or revoked.  Performed at [facility], [place] [address] [place], [place] [address]   Culture, blood (x 2)     Status: None (Preliminary result)   Collection Time: 11/03/20  8:06 PM   Specimen: BLOOD  Result Value Ref Range Status   Specimen Description   Final    BLOOD RIGHT ANTECUBITAL Performed at [place] [address], [place], [place] [address]    Special Requests   Final    BOTTLES DRAWN AEROBIC AND ANAEROBIC Blood Culture adequate volume Performed at [facility] [place] [address], [place], [place] [address]    Culture   Final    NO GROWTH 1 DAY Performed at  [facility], [place] [address], [place], [place] [address]    Report Status PENDING  Incomplete  Culture, blood (x 2)     Status: None (Preliminary result)   Collection Time: 11/03/20  8:11 PM   Specimen: BLOOD  Result Value Ref Range Status   Specimen Description   Final    BLOOD BLOOD RIGHT HAND Performed at [place] [address], [place], [place] [address]    Special Requests   Final    BOTTLES DRAWN AEROBIC AND ANAEROBIC Blood Culture results may not be optimal due to an inadequate volume of blood received in culture bottles Performed at [place] [address] [place], [place] [address]    Culture   Final    NO GROWTH 1 DAY Performed at [facility], [place] [address] [place], [place] [address]    Report Status PENDING  Incomplete  MRSA PCR Screening     Status: None   Collection Time: 11/04/20 12:13 AM   Specimen: Nasopharyngeal  Result Value Ref Range Status   MRSA by PCR NEGATIVE NEGATIVE Final    Comment:  The GeneXpert MRSA Assay (FDA approved for NASAL specimens only), is one component of a comprehensive MRSA colonization surveillance program. It is not intended to diagnose MRSA infection nor to guide or monitor treatment for MRSA infections. Performed at [facility], [place] [address] [place], [place] [address]      Labs: Basic Metabolic Panel: Recent Labs  Lab 11/03/20 1714 11/04/20 0330  NA 136 137  K 3.7 3.2*  CL 101 107  CO2 24 22  GLUCOSE 122* 96  BUN 20 14  CREATININE 0.83 0.63  CALCIUM 9.4 8.4*  MG  --  2.2  PHOS  --  3.7   Liver Function Tests: Recent Labs  Lab 11/03/20 1714 11/04/20 0330  AST 34 16  ALT 27 16  ALKPHOS 101 70  BILITOT 0.6 0.6  PROT 8.4* 5.8*  ALBUMIN 3.6 2.7*   Recent Labs  Lab 11/03/20 1714  LIPASE 22   No results for input(s): AMMONIA in the last 168 hours. CBC: Recent Labs  Lab 11/03/20 1714 11/04/20 0330  WBC 17.7*  9.8  NEUTROABS  --  6.9  HGB 13.5 10.6*  HCT 40.4 32.5*  MCV 88.6 90.8  PLT 345 235   Cardiac Enzymes: No results for input(s): CKTOTAL, CKMB, CKMBINDEX, TROPONINI in the last 168 hours. BNP: BNP (last 3 results) No results for input(s): BNP in the last 8760 hours.  ProBNP (last 3 results) No results for input(s): PROBNP in the last 8760 hours.  CBG: No results for input(s): GLUCAP in the last 168 hours.     Signed:  Domenic Polite MD.  [facility] 11/05/2020, 1:33 PM

## 2020-11-05 NOTE — Progress Notes (Signed)
Initial Nutrition Assessment  DOCUMENTATION CODES:   Not applicable  INTERVENTION:  - continue Ensure Enlive BID, each supplement provides 350 kcal and 20 grams of protein. - will order 1 tablet multivitamin with minerals/day. - monitor for need for diet education prior to d/c.   NUTRITION DIAGNOSIS:   Inadequate oral intake related to acute illness as evidenced by per patient/family report.  GOAL:   Patient will meet greater than or equal to 90% of their needs  MONITOR:   PO intake,Supplement acceptance,Labs,Weight trends  REASON FOR ASSESSMENT:   Malnutrition Screening Tool  ASSESSMENT:   50 year old male with no significant medical history other than being a cigarette smoker. He presented to the ED with perirectal pain x3-4 days, fevers, and chills. He also reported intermittent lower abdominal pain for 6 months. CT abdomen/pelvis concerning for perirectal/perianal infection and abscess.  Patient ate 75% of breakfast this AM (356 kcal and 19 grams protein). Patient laying in bed with no family/visitors present at the time of RD visit.  He reports that for the past 4-6 months he has had intermittent abdominal pain which seems associated with constipation. It is not usual for him to go 2-4 days without a BM. When he does have BMs it is often painful. He was not taking anything PTA to aid with constipation, abdominal pain, or pain while having a BM.   Patient reports that he was informed that rectal concerns are going to be addressed first and that abdominal concerns will be addressed later, likely outpatient.   He does not weigh himself consistently. He states that he usually weighs 75-79 lb; unable to clarify if he means 175-179 lb or 75-79 kg (165-174 lb). He states that weight has been stable and will fluctuate up and down 2-3 lb. He denies any changes in how his clothes have been fitting in the past 6 months.   Weight today is 153 lb, weight yesterday was 155 lb, and  weight on 5/29 was 153 lb. PTA the most recently documented weight was on 04/12/17 when he weighed 186 lb.   Ensure Enlive ordered BID per ONS protocol and patient has accepted 3 of the 3 bottles of supplement offered to him.   He denies abdominal pain today. He denies any nutrition-related questions or concerns at this time.    Labs reviewed; K: 3.2 mmol/l, Ca: 8.4 mg/dl. Medications reviewed; 40 mEq Klor-Con x1 dose 5/30.     NUTRITION - FOCUSED PHYSICAL EXAM:  completed; no muscle or fat depletions, no edema noted at this time.   Diet Order:   Diet Order            Diet Heart Room service appropriate? Yes; Fluid consistency: Thin  Diet effective now                 EDUCATION NEEDS:   Not appropriate for education at this time  Skin:  Skin Assessment: Skin Integrity Issues: Skin Integrity Issues:: Other (Comment) Other: R buttocks wound/incision (5/29)  Last BM:  5/30 (type 6 x1)  Height:   Ht Readings from Last 1 Encounters:  11/03/20 6' (1.829 m)    Weight:   Wt Readings from Last 1 Encounters:  11/05/20 69.4 kg     Estimated Nutritional Needs:  Kcal:  1900-2100 kcal Protein:  90-100 grams Fluid:  >/= 2.2 L/day      Jarome Matin, MS, RD, LDN, CNSC Inpatient Clinical Dietitian RD pager # available in AMION  After hours/weekend pager # available in  AMION

## 2020-11-05 NOTE — Plan of Care (Signed)
  Problem: Nutrition: Goal: Adequate nutrition will be maintained Outcome: Progressing   Problem: Pain Managment: Goal: General experience of comfort will improve Outcome: Progressing   Problem: Safety: Goal: Ability to remain free from injury will improve Outcome: Progressing   

## 2020-11-05 NOTE — Progress Notes (Signed)
[facility] Progress Note     Subjective: CC-  Right buttock still sore and draining WBC normalized yesterday, afebrile today  Objective: Vital signs in last 24 hours: Temp:  [97.6 F (36.4 C)-99.9 F (37.7 C)] 97.7 F (36.5 C) (05/31 0637) Pulse Rate:  [73-88] 74 (05/31 0637) Resp:  [16-20] 16 (05/31 4818) BP: (82-102)/(51-71) 92/62 (05/31 0637) SpO2:  [95 %-100 %] 98 % (05/31 0637) Weight:  [69.4 kg] 69.4 kg (05/31 0500) Last BM Date: 11/04/20  Intake/Output from previous day: 05/30 0701 - 05/31 0700 In: 848.3 [P.O.:720; I.V.:78.3; IV Piggyback:50] Out: 1925 [HUDJS:9702] Intake/Output this shift: Total I/O In: 2284.1 [P.O.:240; I.V.:1001; IV Piggyback:1043] Out: 300 [Urine:300]  PE: Gen:  Alert, NAD, pleasant Pulm: rate and effort normal Abd: Soft, NT/ND Skin: warm and dry GU: right perirectal abscess open (~1cm) and has purulent drainage, some induration remains, no crepitus   Lab Results:  Recent Labs    11/03/20 1714 11/04/20 0330  WBC 17.7* 9.8  HGB 13.5 10.6*  HCT 40.4 32.5*  PLT 345 235   BMET Recent Labs    11/03/20 1714 11/04/20 0330  NA 136 137  K 3.7 3.2*  CL 101 107  CO2 24 22  GLUCOSE 122* 96  BUN 20 14  CREATININE 0.83 0.63  CALCIUM 9.4 8.4*   PT/INR No results for input(s): LABPROT, INR in the last 72 hours. CMP     Component Value Date/Time   NA 137 11/04/2020 0330   K 3.2 (L) 11/04/2020 0330   CL 107 11/04/2020 0330   CO2 22 11/04/2020 0330   GLUCOSE 96 11/04/2020 0330   BUN 14 11/04/2020 0330   CREATININE 0.63 11/04/2020 0330   CALCIUM 8.4 (L) 11/04/2020 0330   PROT 5.8 (L) 11/04/2020 0330   ALBUMIN 2.7 (L) 11/04/2020 0330   AST 16 11/04/2020 0330   ALT 16 11/04/2020 0330   ALKPHOS 70 11/04/2020 0330   BILITOT 0.6 11/04/2020 0330   GFRNONAA >60 11/04/2020 0330   Lipase     Component Value Date/Time   LIPASE 22 11/03/2020 1714       Studies/Results: CT ABDOMEN PELVIS WO CONTRAST  Result Date:  11/03/2020 CLINICAL DATA:  Abdominal pain and fever. Perirectal abscess. Patient reports intermittent lower abdominal pain for 3 months. Rectal pain for 3-4 days. EXAM: CT ABDOMEN AND PELVIS WITHOUT CONTRAST TECHNIQUE: Multidetector CT imaging of the abdomen and pelvis was performed following the standard protocol without IV contrast. COMPARISON:  None. FINDINGS: Lower chest: Clear lung bases. Hepatobiliary: No focal liver abnormality is seen. No gallstones, gallbladder wall thickening, or biliary dilatation. Pancreas: Unremarkable. No pancreatic ductal dilatation or surrounding inflammatory changes. Spleen: Normal in size without focal abnormality. Adrenals/Urinary Tract: No adrenal masses. Kidneys normal size, orientation and position. Vague low-attenuation lesion from the medial upper pole of the right kidney, proximally 9 mm in size, not fully characterized, but likely a cyst. No other renal masses or lesions, no stones and no hydronephrosis. Normal ureters. Normal bladder. Stomach/Bowel: Eccentric thickening of the inferior rectal wall. Middle and upper rectum are unremarkable. Colon and small bowel are normal in caliber. No wall thickening. No inflammation. Normal appearance of the stomach. No evidence of appendicitis. Vascular/Lymphatic: Aortic atherosclerosis. No aneurysm. Prominent right inguinal lymph nodes, largest measuring 1 cm short axis. No other adenopathy. Reproductive: Prostate is unremarkable. Other: There is soft tissue air and inflammation along the medial aspect of the right buttock and right perineum with ill-defined soft tissue surrounding anus. Inflammation extends  to the inferior rectum just above the levator sling. There is no defined fluid collection to suggest an abscess. No ascites. Musculoskeletal: No fracture or acute finding. No bone lesion. No bone resorption to suggest osteomyelitis. IMPRESSION: 1. Perirectal/perianal infection reflected by soft tissue inflammation and soft tissue  air, along with thickening of the inferior rectal wall. Mild inflammatory change extends just above the levator sling adjacent to the inferior rectum. There is no fluid collection to suggest an abscess, however. There are prominent right inguinal lymph nodes, presumed reactive to the perineal inflammation. 2. No other acute abnormality within the abdomen or pelvis. 3. Aortic atherosclerosis. Electronically Signed   By: Lajean Manes M.D.   On: 11/03/2020 18:33    Anti-infectives: Anti-infectives (From admission, onward)   Start     Dose/Rate Route Frequency Ordered Stop   11/04/20 0400  piperacillin-tazobactam (ZOSYN) IVPB 3.375 g        3.375 g 12.5 mL/hr over 240 Minutes Intravenous Every 8 hours 11/03/20 2024     11/03/20 1815  piperacillin-tazobactam (ZOSYN) IVPB 3.375 g        3.375 g 100 mL/hr over 30 Minutes Intravenous  Once 11/03/20 1802 11/03/20 2003       Assessment/Plan Right perirectal abscess - Abscess open and draining well. Ok to transition to oral antibiotics (augmentin) and discharge home today. Discussed sitz baths. Follow up with Dr. [name] in 3 weeks.  Tobacco abuse  ID - zosyn 5/29>> FEN - heart healthy diet VTE - ok for chemical DVT prophylaxis from surgical standpoint Foley - none Follow up - Dr. [name]   LOS: 2 days    Wellington Hampshire, [facility] 11/05/2020, 11:44 AM Please see Amion for pager number during day hours 7:00am-4:30pm

## 2020-11-05 NOTE — Discharge Instructions (Signed)
How to Take a Sitz Bath A sitz bath is a warm water bath that may be used to care for your rectum, genital area, or the area between your rectum and genitals (perineum). In a sitz bath, the water only comes up to your hips and covers your buttocks. A sitz bath may be done in a bathtub or with a portable sitz bath that fits over the toilet. Your health care provider may recommend a sitz bath to help:  Relieve pain and discomfort after delivering a baby.  Relieve pain and itching from hemorrhoids or anal fissures.  Relieve pain after certain surgeries.  Relax muscles that are sore or tight. How to take a sitz bath Take 3-4 sitz baths a day, or as many as told by your health care provider. Bathtub sitz bath To take a sitz bath in a bathtub: 1. Partially fill a bathtub with warm water. The water should be deep enough to cover your hips and buttocks when you are sitting in the tub. 2. Follow your health care provider's instructions if you are told to put medicine in the water. 3. Sit in the water. Open the tub drain a little, and leave it open during your bath. 4. Turn on the warm water again, enough to replace the water that is draining out. Keep the water running throughout your bath. This helps keep the water at the right level and temperature. 5. Soak in the water for 15-20 minutes, or as long as told by your health care provider. 6. When you are done, be careful when you stand up. You may feel dizzy. 7. After the sitz bath, pat yourself dry. Do not rub your skin to dry it.   Over-the-toilet sitz bath To take a sitz bath with an over-the-toilet basin: 1. Follow the manufacturer's instructions. 2. Fill the basin with warm water. 3. Follow your health care provider's instructions if you were told to put medicine in the water. 4. Sit on the seat. Make sure the water covers your buttocks and perineum. 5. Soak in the water for 15-20 minutes, or as long as told by your health care  provider. 6. After the sitz bath, pat yourself dry. Do not rub your skin to dry it. 7. Clean and dry the basin between uses. 8. Discard the basin if it cracks, or according to the manufacturer's instructions.   Contact a health care provider if:  Your pain or itching gets worse. Do not continue with sitz baths if your symptoms get worse.  You have new symptoms. Do not continue with sitz baths until you talk with your health care provider. Summary  A sitz bath is a warm water bath in which the water only comes up to your hips and covers your buttocks.  A sitz bath may help relieve pain and discomfort after delivering a baby. It also may help with pain and itching from hemorrhoids or anal fissures, or pain after certain surgeries. It can also help to relax muscles that are sore or tight.  Take 3-4 sitz baths a day, or as many as told by your health care provider. Soak in the water for 15-20 minutes.  Do not continue with sitz baths if your symptoms get worse. This information is not intended to replace advice given to you by your health care provider. Make sure you discuss any questions you have with your health care provider. Document Revised: 02/08/2020 Document Reviewed: 02/08/2020 Elsevier Patient Education  2021 [employer]  

## 2020-11-05 NOTE — Progress Notes (Signed)
Patient discharged to home w/ family. Given all belongings, instructions, dressing materials. Verbalized understanding of all instructions. Escorted to pov via w/c.

## 2020-11-09 LAB — CULTURE, BLOOD (ROUTINE X 2)
Culture: NO GROWTH
Culture: NO GROWTH
Special Requests: ADEQUATE

## 2020-11-29 ENCOUNTER — Inpatient Hospital Stay (HOSPITAL_COMMUNITY)
Admission: EM | Admit: 2020-11-29 | Discharge: 2020-12-19 | DRG: 853 | Disposition: A | Discharge status: Home Health | Payer: Self-pay | Attending: Internal Medicine | Admitting: Internal Medicine

## 2020-11-29 ENCOUNTER — Other Ambulatory Visit: Payer: Self-pay

## 2020-11-29 ENCOUNTER — Encounter (HOSPITAL_COMMUNITY): Payer: Self-pay

## 2020-11-29 DIAGNOSIS — E876 Hypokalemia: Secondary | ICD-10-CM | POA: Diagnosis present

## 2020-11-29 DIAGNOSIS — M726 Necrotizing fasciitis: Secondary | ICD-10-CM | POA: Diagnosis present

## 2020-11-29 DIAGNOSIS — R7303 Prediabetes: Secondary | ICD-10-CM | POA: Diagnosis present

## 2020-11-29 DIAGNOSIS — L03315 Cellulitis of perineum: Secondary | ICD-10-CM

## 2020-11-29 DIAGNOSIS — C775 Secondary and unspecified malignant neoplasm of intrapelvic lymph nodes: Secondary | ICD-10-CM | POA: Diagnosis present

## 2020-11-29 DIAGNOSIS — Z20822 Contact with and (suspected) exposure to covid-19: Secondary | ICD-10-CM | POA: Diagnosis present

## 2020-11-29 DIAGNOSIS — K611 Rectal abscess: Secondary | ICD-10-CM | POA: Diagnosis present

## 2020-11-29 DIAGNOSIS — R6521 Severe sepsis with septic shock: Secondary | ICD-10-CM | POA: Diagnosis present

## 2020-11-29 DIAGNOSIS — K624 Stenosis of anus and rectum: Secondary | ICD-10-CM

## 2020-11-29 DIAGNOSIS — E440 Moderate protein-calorie malnutrition: Secondary | ICD-10-CM | POA: Diagnosis present

## 2020-11-29 DIAGNOSIS — J9601 Acute respiratory failure with hypoxia: Secondary | ICD-10-CM | POA: Diagnosis present

## 2020-11-29 DIAGNOSIS — Z720 Tobacco use: Secondary | ICD-10-CM | POA: Diagnosis present

## 2020-11-29 DIAGNOSIS — F17210 Nicotine dependence, cigarettes, uncomplicated: Secondary | ICD-10-CM | POA: Diagnosis present

## 2020-11-29 DIAGNOSIS — A4151 Sepsis due to Escherichia coli [E. coli]: Principal | ICD-10-CM | POA: Diagnosis present

## 2020-11-29 DIAGNOSIS — N493 Fournier gangrene: Secondary | ICD-10-CM

## 2020-11-29 DIAGNOSIS — K6289 Other specified diseases of anus and rectum: Secondary | ICD-10-CM | POA: Diagnosis present

## 2020-11-29 DIAGNOSIS — C20 Malignant neoplasm of rectum: Secondary | ICD-10-CM

## 2020-11-29 DIAGNOSIS — D62 Acute posthemorrhagic anemia: Secondary | ICD-10-CM | POA: Diagnosis present

## 2020-11-29 DIAGNOSIS — Z681 Body mass index (BMI) 19 or less, adult: Secondary | ICD-10-CM

## 2020-11-29 DIAGNOSIS — C7931 Secondary malignant neoplasm of brain: Secondary | ICD-10-CM

## 2020-11-29 DIAGNOSIS — A419 Sepsis, unspecified organism: Secondary | ICD-10-CM | POA: Diagnosis present

## 2020-11-29 DIAGNOSIS — Z789 Other specified health status: Secondary | ICD-10-CM

## 2020-11-29 DIAGNOSIS — Z452 Encounter for adjustment and management of vascular access device: Secondary | ICD-10-CM

## 2020-11-29 DIAGNOSIS — N179 Acute kidney failure, unspecified: Secondary | ICD-10-CM | POA: Diagnosis present

## 2020-11-29 HISTORY — DX: Tobacco use: Z72.0

## 2020-11-29 MED ORDER — VANCOMYCIN HCL 1500 MG/300ML IV SOLN
1500.0000 mg | Freq: Once | INTRAVENOUS | Status: AC
  Administered 2020-11-30: 1500 mg via INTRAVENOUS
  Filled 2020-11-29: qty 300

## 2020-11-29 MED ORDER — SODIUM CHLORIDE 0.9 % IV SOLN: 1.0000 g | Freq: Once | INTRAVENOUS | Status: DC

## 2020-11-29 MED ORDER — SODIUM CHLORIDE 0.9 % IV SOLN
2.0000 g | Freq: Once | INTRAVENOUS | Status: AC
  Administered 2020-11-30: 2 g via INTRAVENOUS
  Filled 2020-11-29: qty 2

## 2020-11-29 MED ORDER — FENTANYL CITRATE (PF) 100 MCG/2ML IJ SOLN
50.0000 ug | Freq: Once | INTRAMUSCULAR | Status: AC
  Administered 2020-11-29: 50 ug via INTRAVENOUS
  Filled 2020-11-29: qty 2

## 2020-11-29 MED ORDER — VANCOMYCIN HCL IN DEXTROSE 1-5 GM/200ML-% IV SOLN: 1000.0000 mg | Freq: Once | INTRAVENOUS | Status: DC

## 2020-11-29 NOTE — ED Triage Notes (Signed)
Pt reports he was admitted to the hospital for a perianal abscess recently. Pt his here today the pain is not radiating up towards his abdomen. Pt denies N/V/D.

## 2020-11-29 NOTE — Progress Notes (Signed)
A consult was received from an ED physician for Cefepime & Vancomycin per pharmacy dosing.  The patient's profile has been reviewed for ht/wt/allergies/indication/available labs.   A one time order has been placed for Cefepime 2gm & Vancomycin 1500mg  IV.  Further antibiotics/pharmacy consults should be ordered by admitting physician if indicated.                       Thank you, Netta Cedars PharmD 11/29/2020  11:47 PM

## 2020-11-30 ENCOUNTER — Inpatient Hospital Stay (HOSPITAL_COMMUNITY): Payer: Self-pay

## 2020-11-30 ENCOUNTER — Other Ambulatory Visit: Payer: Self-pay

## 2020-11-30 ENCOUNTER — Emergency Department (HOSPITAL_COMMUNITY): Payer: Self-pay

## 2020-11-30 ENCOUNTER — Encounter (HOSPITAL_COMMUNITY)
Admission: EM | Disposition: A | Discharge status: Home Health | Payer: Self-pay | Source: Home / Self Care | Attending: Family Medicine

## 2020-11-30 ENCOUNTER — Encounter (HOSPITAL_COMMUNITY): Payer: Self-pay | Admitting: Radiology

## 2020-11-30 ENCOUNTER — Inpatient Hospital Stay (HOSPITAL_COMMUNITY): Payer: Self-pay | Admitting: Anesthesiology

## 2020-11-30 DIAGNOSIS — N493 Fournier gangrene: Secondary | ICD-10-CM | POA: Diagnosis present

## 2020-11-30 DIAGNOSIS — Z789 Other specified health status: Secondary | ICD-10-CM

## 2020-11-30 HISTORY — PX: INCISION AND DRAINAGE PERIRECTAL ABSCESS: SHX1804

## 2020-11-30 LAB — COMPREHENSIVE METABOLIC PANEL
ALT: 23 U/L (ref 0–44)
AST: 20 U/L (ref 15–41)
Albumin: 3.2 g/dL — ABNORMAL LOW (ref 3.5–5.0)
Alkaline Phosphatase: 116 U/L (ref 38–126)
Anion gap: 13 (ref 5–15)
BUN: 15 mg/dL (ref 6–20)
CO2: 23 mmol/L (ref 22–32)
Calcium: 9.2 mg/dL (ref 8.9–10.3)
Chloride: 94 mmol/L — ABNORMAL LOW (ref 98–111)
Creatinine, Ser: 1.34 mg/dL — ABNORMAL HIGH (ref 0.61–1.24)
GFR, Estimated: >60 mL/min (ref >60)
Glucose, Bld: 166 mg/dL — ABNORMAL HIGH (ref 70–99)
Potassium: 4 mmol/L (ref 3.5–5.1)
Sodium: 130 mmol/L — ABNORMAL LOW (ref 135–145)
Total Bilirubin: 1.1 mg/dL (ref 0.3–1.2)
Total Protein: 8 g/dL (ref 6.5–8.1)

## 2020-11-30 LAB — CBC WITH DIFFERENTIAL/PLATELET
Band Neutrophils: 18 %
Basophils Relative: 0 %
Blasts: NONE SEEN %
Eosinophils Relative: 0 %
HCT: 38.8 % — ABNORMAL LOW (ref 39.0–52.0)
Hemoglobin: 12.8 g/dL — ABNORMAL LOW (ref 13.0–17.0)
Lymphocytes Relative: 5 %
MCH: 29 pg (ref 26.0–34.0)
MCHC: 33 g/dL (ref 30.0–36.0)
MCV: 87.8 fL (ref 80.0–100.0)
Metamyelocytes Relative: NONE SEEN %
Monocytes Relative: 5 %
Myelocytes: NONE SEEN %
Neutrophils Relative %: 72 %
Platelets: 348 10*3/uL (ref 150–400)
Promyelocytes Relative: NONE SEEN %
RBC Morphology: NORMAL
RBC: 4.42 MIL/uL (ref 4.22–5.81)
RDW: 13.5 % (ref 11.5–15.5)
WBC Morphology: NORMAL
WBC: 30.9 10*3/uL — ABNORMAL HIGH (ref 4.0–10.5)
nRBC: 0 % (ref 0.0–0.2)
nRBC: NONE SEEN /100 WBC

## 2020-11-30 LAB — BASIC METABOLIC PANEL
Anion gap: 9 (ref 5–15)
BUN: 11 mg/dL (ref 6–20)
CO2: 21 mmol/L — ABNORMAL LOW (ref 22–32)
Calcium: 8 mg/dL — ABNORMAL LOW (ref 8.9–10.3)
Chloride: 109 mmol/L (ref 98–111)
Creatinine, Ser: 0.7 mg/dL (ref 0.61–1.24)
GFR, Estimated: >60 mL/min (ref >60)
Glucose, Bld: 160 mg/dL — ABNORMAL HIGH (ref 70–99)
Potassium: 3.7 mmol/L (ref 3.5–5.1)
Sodium: 139 mmol/L (ref 135–145)

## 2020-11-30 LAB — URINALYSIS, ROUTINE W REFLEX MICROSCOPIC
Bilirubin Urine: NEGATIVE
Glucose, UA: NEGATIVE mg/dL
Hgb urine dipstick: NEGATIVE
Ketones, ur: NEGATIVE mg/dL
Leukocytes,Ua: NEGATIVE
Nitrite: NEGATIVE
Protein, ur: NEGATIVE mg/dL
Specific Gravity, Urine: 1.034 — ABNORMAL HIGH (ref 1.005–1.030)
pH: 5 (ref 5.0–8.0)

## 2020-11-30 LAB — RESP PANEL BY RT-PCR (FLU A&B, COVID) ARPGX2
Influenza A by PCR: NEGATIVE
Influenza B by PCR: NEGATIVE
SARS Coronavirus 2 by RT PCR: NEGATIVE

## 2020-11-30 LAB — LACTIC ACID, PLASMA
Lactic Acid, Venous: 3.2 mmol/L (ref 0.5–1.9)
Lactic Acid, Venous: 4.1 mmol/L (ref 0.5–1.9)
Lactic Acid, Venous: 2.7 mmol/L (ref 0.5–1.9)

## 2020-11-30 SURGERY — INCISION AND DRAINAGE, ABSCESS, PERIRECTAL
Anesthesia: General | Site: Scrotum

## 2020-11-30 MED ORDER — LIP MEDEX EX OINT
1.0000 "application " | TOPICAL_OINTMENT | Freq: Two times a day (BID) | CUTANEOUS | Status: DC
  Administered 2020-11-30 – 2020-12-19 (×38): 1 via TOPICAL
  Filled 2020-11-30 (×8): qty 7

## 2020-11-30 MED ORDER — ALBUMIN HUMAN 5 % IV SOLN: INTRAVENOUS | Status: DC | PRN

## 2020-11-30 MED ORDER — ZINC OXIDE 40 % EX OINT
TOPICAL_OINTMENT | Freq: Two times a day (BID) | CUTANEOUS | Status: DC
  Administered 2020-12-05 – 2020-12-18 (×6): 1 via TOPICAL
  Filled 2020-11-30 (×4): qty 57

## 2020-11-30 MED ORDER — GABAPENTIN 300 MG PO CAPS
300.0000 mg | ORAL_CAPSULE | ORAL | Status: AC
  Administered 2020-12-01: 300 mg via ORAL
  Filled 2020-11-30: qty 1

## 2020-11-30 MED ORDER — CALCIUM CHLORIDE 10 % IV SOLN
INTRAVENOUS | Status: AC
  Filled 2020-11-30: qty 10

## 2020-11-30 MED ORDER — SODIUM CHLORIDE 0.9 % IV BOLUS
1000.0000 mL | Freq: Once | INTRAVENOUS | Status: AC
  Administered 2020-11-30: 1000 mL via INTRAVENOUS

## 2020-11-30 MED ORDER — LACTATED RINGERS IV SOLN: INTRAVENOUS | Status: DC

## 2020-11-30 MED ORDER — VANCOMYCIN HCL 1250 MG/250ML IV SOLN
1250.0000 mg | Freq: Two times a day (BID) | INTRAVENOUS | Status: DC
  Administered 2020-11-30 – 2020-12-02 (×4): 1250 mg via INTRAVENOUS
  Filled 2020-11-30 (×5): qty 250

## 2020-11-30 MED ORDER — CLINDAMYCIN PHOSPHATE 600 MG/50ML IV SOLN
600.0000 mg | Freq: Once | INTRAVENOUS | Status: DC
  Filled 2020-11-30: qty 50

## 2020-11-30 MED ORDER — ONDANSETRON HCL 4 MG/2ML IJ SOLN: 4.0000 mg | Freq: Four times a day (QID) | INTRAMUSCULAR | Status: DC | PRN

## 2020-11-30 MED ORDER — NOREPINEPHRINE 4 MG/250ML-% IV SOLN: 0.0000 ug/min | INTRAVENOUS | Status: DC

## 2020-11-30 MED ORDER — ROCURONIUM BROMIDE 10 MG/ML (PF) SYRINGE
PREFILLED_SYRINGE | INTRAVENOUS | Status: DC | PRN
  Administered 2020-11-30: 20 mg via INTRAVENOUS
  Administered 2020-11-30: 30 mg via INTRAVENOUS

## 2020-11-30 MED ORDER — DIPHENHYDRAMINE HCL 12.5 MG/5ML PO ELIX: 12.5000 mg | ORAL_SOLUTION | Freq: Four times a day (QID) | ORAL | Status: DC | PRN

## 2020-11-30 MED ORDER — PHENYLEPHRINE HCL-NACL 10-0.9 MG/250ML-% IV SOLN
INTRAVENOUS | Status: DC | PRN
  Administered 2020-11-30: 50 ug/min via INTRAVENOUS

## 2020-11-30 MED ORDER — ALBUTEROL SULFATE (2.5 MG/3ML) 0.083% IN NEBU
2.5000 mg | INHALATION_SOLUTION | Freq: Four times a day (QID) | RESPIRATORY_TRACT | Status: DC | PRN
  Administered 2020-11-30: 2.5 mg via RESPIRATORY_TRACT

## 2020-11-30 MED ORDER — ALBUMIN HUMAN 5 % IV SOLN
INTRAVENOUS | Status: AC
  Filled 2020-11-30: qty 250

## 2020-11-30 MED ORDER — LIDOCAINE HCL (CARDIAC) PF 100 MG/5ML IV SOSY
PREFILLED_SYRINGE | INTRAVENOUS | Status: DC | PRN
  Administered 2020-11-30: 50 mg via INTRAVENOUS

## 2020-11-30 MED ORDER — LACTATED RINGERS IV SOLN: INTRAVENOUS | Status: DC | PRN

## 2020-11-30 MED ORDER — ALBUMIN HUMAN 5 % IV SOLN: 12.5000 g | Freq: Once | INTRAVENOUS | Status: DC

## 2020-11-30 MED ORDER — METHOCARBAMOL 1000 MG/10ML IJ SOLN
1000.0000 mg | Freq: Four times a day (QID) | INTRAVENOUS | Status: DC | PRN
  Filled 2020-11-30: qty 10

## 2020-11-30 MED ORDER — PIPERACILLIN-TAZOBACTAM 3.375 G IVPB
3.3750 g | Freq: Three times a day (TID) | INTRAVENOUS | Status: DC
  Administered 2020-11-30 – 2020-12-04 (×13): 3.375 g via INTRAVENOUS
  Filled 2020-11-30 (×14): qty 50

## 2020-11-30 MED ORDER — LIDOCAINE 2% (20 MG/ML) 5 ML SYRINGE
INTRAMUSCULAR | Status: AC
  Filled 2020-11-30: qty 5

## 2020-11-30 MED ORDER — PHENYLEPHRINE HCL (PRESSORS) 10 MG/ML IV SOLN
INTRAVENOUS | Status: AC
  Filled 2020-11-30: qty 1

## 2020-11-30 MED ORDER — SUCCINYLCHOLINE CHLORIDE 200 MG/10ML IV SOSY
PREFILLED_SYRINGE | INTRAVENOUS | Status: AC
  Filled 2020-11-30: qty 10

## 2020-11-30 MED ORDER — AMISULPRIDE (ANTIEMETIC) 5 MG/2ML IV SOLN
10.0000 mg | Freq: Once | INTRAVENOUS | Status: DC | PRN
Start: 2020-11-30 — End: 2020-11-30

## 2020-11-30 MED ORDER — HYDROMORPHONE HCL 1 MG/ML IJ SOLN: 0.2500 mg | INTRAMUSCULAR | Status: DC | PRN

## 2020-11-30 MED ORDER — PIPERACILLIN-TAZOBACTAM 3.375 G IVPB
INTRAVENOUS | Status: AC
  Administered 2020-11-30: 3.375 g via INTRAVENOUS
  Filled 2020-11-30: qty 50

## 2020-11-30 MED ORDER — IOHEXOL 300 MG/ML  SOLN
100.0000 mL | Freq: Once | INTRAMUSCULAR | Status: AC | PRN
  Administered 2020-11-30: 100 mL via INTRAVENOUS

## 2020-11-30 MED ORDER — SIMETHICONE 40 MG/0.6ML PO SUSP: 80.0000 mg | Freq: Four times a day (QID) | ORAL | Status: DC | PRN

## 2020-11-30 MED ORDER — MIDAZOLAM HCL 2 MG/2ML IJ SOLN
INTRAMUSCULAR | Status: DC | PRN
  Administered 2020-11-30: 2 mg via INTRAVENOUS

## 2020-11-30 MED ORDER — SUCCINYLCHOLINE 20MG/ML (10ML) SYRINGE FOR MEDFUSION PUMP - OPTIME
INTRAMUSCULAR | Status: DC | PRN
  Administered 2020-11-30: 120 mg via INTRAVENOUS

## 2020-11-30 MED ORDER — CALCIUM CHLORIDE 10 % IV SOLN
INTRAVENOUS | Status: DC | PRN
  Administered 2020-11-30: 250 mg via INTRAVENOUS

## 2020-11-30 MED ORDER — ACETAMINOPHEN 10 MG/ML IV SOLN: 1000.0000 mg | Freq: Once | INTRAVENOUS | Status: DC | PRN

## 2020-11-30 MED ORDER — MIDAZOLAM HCL 2 MG/2ML IJ SOLN
INTRAMUSCULAR | Status: AC
  Filled 2020-11-30: qty 2

## 2020-11-30 MED ORDER — SUGAMMADEX SODIUM 200 MG/2ML IV SOLN
INTRAVENOUS | Status: DC | PRN
  Administered 2020-11-30: 200 mg via INTRAVENOUS

## 2020-11-30 MED ORDER — ENOXAPARIN SODIUM 40 MG/0.4ML IJ SOSY
40.0000 mg | PREFILLED_SYRINGE | INTRAMUSCULAR | Status: DC
  Administered 2020-11-30 – 2020-12-03 (×4): 40 mg via SUBCUTANEOUS
  Filled 2020-11-30 (×5): qty 0.4

## 2020-11-30 MED ORDER — FENTANYL CITRATE (PF) 100 MCG/2ML IJ SOLN
25.0000 ug | Freq: Once | INTRAMUSCULAR | Status: AC
  Administered 2020-11-30: 25 ug via INTRAVENOUS

## 2020-11-30 MED ORDER — PROCHLORPERAZINE EDISYLATE 10 MG/2ML IJ SOLN
5.0000 mg | Freq: Four times a day (QID) | INTRAMUSCULAR | Status: DC | PRN
Start: 2020-11-30 — End: 2020-12-05

## 2020-11-30 MED ORDER — METHOCARBAMOL 500 MG PO TABS: 500.0000 mg | ORAL_TABLET | Freq: Four times a day (QID) | ORAL | Status: DC | PRN

## 2020-11-30 MED ORDER — DIBUCAINE (PERIANAL) 1 % EX OINT
TOPICAL_OINTMENT | CUTANEOUS | Status: AC
  Filled 2020-11-30: qty 28

## 2020-11-30 MED ORDER — ACETAMINOPHEN 325 MG PO TABS: 325.0000 mg | ORAL_TABLET | Freq: Once | ORAL | Status: DC | PRN

## 2020-11-30 MED ORDER — ORAL CARE MOUTH RINSE
15.0000 mL | Freq: Two times a day (BID) | OROMUCOSAL | Status: DC
  Administered 2020-11-30 – 2020-12-19 (×32): 15 mL via OROMUCOSAL

## 2020-11-30 MED ORDER — ONDANSETRON HCL 4 MG/2ML IJ SOLN
INTRAMUSCULAR | Status: DC | PRN
  Administered 2020-11-30: 4 mg via INTRAVENOUS

## 2020-11-30 MED ORDER — SODIUM CHLORIDE 0.9% FLUSH
10.0000 mL | INTRAVENOUS | Status: DC | PRN
  Administered 2020-12-17 – 2020-12-18 (×3): 10 mL

## 2020-11-30 MED ORDER — PHENYLEPHRINE HCL-NACL 10-0.9 MG/250ML-% IV SOLN
0.0000 ug/min | INTRAVENOUS | Status: DC
  Administered 2020-11-30: 140 ug/min via INTRAVENOUS
  Administered 2020-11-30: 110 ug/min via INTRAVENOUS
  Filled 2020-11-30 (×2): qty 250

## 2020-11-30 MED ORDER — CHLORHEXIDINE GLUCONATE CLOTH 2 % EX PADS
6.0000 | MEDICATED_PAD | Freq: Once | CUTANEOUS | Status: AC
  Administered 2020-12-01: 6 via TOPICAL

## 2020-11-30 MED ORDER — POLYETHYLENE GLYCOL 3350 17 G PO PACK
17.0000 g | PACK | Freq: Two times a day (BID) | ORAL | Status: DC
  Administered 2020-11-30 (×2): 17 g via ORAL
  Filled 2020-11-30 (×2): qty 1

## 2020-11-30 MED ORDER — OXYCODONE HCL 5 MG PO TABS
5.0000 mg | ORAL_TABLET | ORAL | Status: DC | PRN
  Administered 2020-11-30 – 2020-12-04 (×4): 5 mg via ORAL
  Filled 2020-11-30 (×4): qty 1

## 2020-11-30 MED ORDER — ACETAMINOPHEN 160 MG/5ML PO SOLN: 325.0000 mg | Freq: Once | ORAL | Status: DC | PRN

## 2020-11-30 MED ORDER — MAGIC MOUTHWASH
15.0000 mL | Freq: Four times a day (QID) | ORAL | Status: DC | PRN
  Filled 2020-11-30: qty 15

## 2020-11-30 MED ORDER — PROPOFOL 10 MG/ML IV BOLUS
INTRAVENOUS | Status: DC | PRN
  Administered 2020-11-30: 100 mg via INTRAVENOUS

## 2020-11-30 MED ORDER — MEPERIDINE HCL 50 MG/ML IJ SOLN: 6.2500 mg | INTRAMUSCULAR | Status: DC | PRN

## 2020-11-30 MED ORDER — ENALAPRILAT 1.25 MG/ML IV SOLN: 0.6250 mg | Freq: Four times a day (QID) | INTRAVENOUS | Status: DC | PRN

## 2020-11-30 MED ORDER — CHLORHEXIDINE GLUCONATE CLOTH 2 % EX PADS
6.0000 | MEDICATED_PAD | Freq: Every day | CUTANEOUS | Status: DC
  Administered 2020-11-30 – 2020-12-19 (×20): 6 via TOPICAL

## 2020-11-30 MED ORDER — PHENYLEPHRINE CONCENTRATED 100MG/250ML (0.4 MG/ML) INFUSION SIMPLE
0.0000 ug/min | INTRAVENOUS | Status: DC
  Administered 2020-11-30: 20 ug/min via INTRAVENOUS
  Filled 2020-11-30 (×2): qty 250

## 2020-11-30 MED ORDER — NICOTINE 21 MG/24HR TD PT24
21.0000 mg | MEDICATED_PATCH | Freq: Every day | TRANSDERMAL | Status: DC
  Administered 2020-11-30 – 2020-12-19 (×20): 21 mg via TRANSDERMAL
  Filled 2020-11-30 (×20): qty 1

## 2020-11-30 MED ORDER — SODIUM CHLORIDE 0.9 % IV SOLN
250.0000 mL | INTRAVENOUS | Status: DC
  Administered 2020-12-05 – 2020-12-10 (×3): 250 mL via INTRAVENOUS

## 2020-11-30 MED ORDER — ALBUTEROL SULFATE (2.5 MG/3ML) 0.083% IN NEBU
INHALATION_SOLUTION | RESPIRATORY_TRACT | Status: AC
  Filled 2020-11-30: qty 3

## 2020-11-30 MED ORDER — FENTANYL CITRATE (PF) 250 MCG/5ML IJ SOLN
INTRAMUSCULAR | Status: AC
  Filled 2020-11-30: qty 5

## 2020-11-30 MED ORDER — PHENYLEPHRINE 40 MCG/ML (10ML) SYRINGE FOR IV PUSH (FOR BLOOD PRESSURE SUPPORT)
PREFILLED_SYRINGE | INTRAVENOUS | Status: DC | PRN
  Administered 2020-11-30: 40 ug via INTRAVENOUS
  Administered 2020-11-30: 120 ug via INTRAVENOUS
  Administered 2020-11-30 (×3): 80 ug via INTRAVENOUS

## 2020-11-30 MED ORDER — PROCHLORPERAZINE MALEATE 10 MG PO TABS: 10.0000 mg | ORAL_TABLET | Freq: Four times a day (QID) | ORAL | Status: DC | PRN

## 2020-11-30 MED ORDER — PROPOFOL 10 MG/ML IV BOLUS
INTRAVENOUS | Status: AC
  Filled 2020-11-30: qty 20

## 2020-11-30 MED ORDER — ONDANSETRON 4 MG PO TBDP: 4.0000 mg | ORAL_TABLET | Freq: Four times a day (QID) | ORAL | Status: DC | PRN

## 2020-11-30 MED ORDER — PROMETHAZINE HCL 25 MG/ML IJ SOLN: 6.2500 mg | INTRAMUSCULAR | Status: DC | PRN

## 2020-11-30 MED ORDER — ACETAMINOPHEN 500 MG PO TABS
1000.0000 mg | ORAL_TABLET | ORAL | Status: AC
  Administered 2020-12-01: 1000 mg via ORAL
  Filled 2020-11-30: qty 2

## 2020-11-30 MED ORDER — PHENYLEPHRINE 40 MCG/ML (10ML) SYRINGE FOR IV PUSH (FOR BLOOD PRESSURE SUPPORT)
100.0000 ug | PREFILLED_SYRINGE | INTRAVENOUS | Status: DC
  Filled 2020-11-30: qty 10

## 2020-11-30 MED ORDER — METOPROLOL TARTRATE 5 MG/5ML IV SOLN: 5.0000 mg | Freq: Four times a day (QID) | INTRAVENOUS | Status: DC | PRN

## 2020-11-30 MED ORDER — LACTATED RINGERS IV BOLUS: 1000.0000 mL | Freq: Three times a day (TID) | INTRAVENOUS | Status: DC | PRN

## 2020-11-30 MED ORDER — PHENYLEPHRINE HCL-NACL 10-0.9 MG/250ML-% IV SOLN
25.0000 ug/min | INTRAVENOUS | Status: DC
  Administered 2020-11-30: 120 ug/min via INTRAVENOUS
  Administered 2020-11-30: 130 ug/min via INTRAVENOUS
  Filled 2020-11-30: qty 250

## 2020-11-30 MED ORDER — LACTATED RINGERS IV BOLUS
1000.0000 mL | Freq: Once | INTRAVENOUS | Status: AC
  Administered 2020-11-30: 1000 mL via INTRAVENOUS

## 2020-11-30 MED ORDER — BISACODYL 10 MG RE SUPP: 10.0000 mg | Freq: Two times a day (BID) | RECTAL | Status: DC | PRN

## 2020-11-30 MED ORDER — DOCUSATE SODIUM 100 MG PO CAPS
100.0000 mg | ORAL_CAPSULE | Freq: Two times a day (BID) | ORAL | Status: DC
  Administered 2020-11-30 (×2): 100 mg via ORAL
  Filled 2020-11-30 (×3): qty 1

## 2020-11-30 MED ORDER — 0.9 % SODIUM CHLORIDE (POUR BTL) OPTIME
TOPICAL | Status: DC | PRN
  Administered 2020-11-30: 1000 mL

## 2020-11-30 MED ORDER — PHENYLEPHRINE HCL-NACL 10-0.9 MG/250ML-% IV SOLN
INTRAVENOUS | Status: AC
  Filled 2020-11-30: qty 250

## 2020-11-30 MED ORDER — PHENYLEPHRINE HCL-NACL 10-0.9 MG/250ML-% IV SOLN
INTRAVENOUS | Status: AC
  Administered 2020-11-30: 50 ug/min via INTRAVENOUS
  Filled 2020-11-30: qty 250

## 2020-11-30 MED ORDER — DIPHENHYDRAMINE HCL 50 MG/ML IJ SOLN: 12.5000 mg | Freq: Four times a day (QID) | INTRAMUSCULAR | Status: DC | PRN

## 2020-11-30 MED ORDER — FENTANYL CITRATE (PF) 100 MCG/2ML IJ SOLN
25.0000 ug | INTRAMUSCULAR | Status: DC | PRN
  Administered 2020-12-01: 25 ug via INTRAVENOUS
  Administered 2020-12-01 (×2): 50 ug via INTRAVENOUS
  Administered 2020-12-02 – 2020-12-04 (×4): 25 ug via INTRAVENOUS
  Filled 2020-11-30 (×6): qty 2

## 2020-11-30 MED ORDER — SODIUM CHLORIDE 0.9 % IV SOLN: INTRAVENOUS | Status: DC

## 2020-11-30 MED ORDER — FENTANYL CITRATE (PF) 250 MCG/5ML IJ SOLN
INTRAMUSCULAR | Status: DC | PRN
  Administered 2020-11-30: 100 ug via INTRAVENOUS

## 2020-11-30 MED ORDER — SODIUM CHLORIDE 0.9% FLUSH
10.0000 mL | Freq: Two times a day (BID) | INTRAVENOUS | Status: DC
Start: 2020-11-30 — End: 2020-12-19
  Administered 2020-11-30 – 2020-12-01 (×3): 10 mL
  Administered 2020-12-01: 40 mL
  Administered 2020-12-02 (×2): 10 mL
  Administered 2020-12-03: 40 mL
  Administered 2020-12-03 – 2020-12-04 (×2): 10 mL
  Administered 2020-12-05: 20 mL
  Administered 2020-12-06 – 2020-12-09 (×7): 10 mL
  Administered 2020-12-09: 20 mL
  Administered 2020-12-10 – 2020-12-19 (×9): 10 mL

## 2020-11-30 MED ORDER — ALBUMIN HUMAN 5 % IV SOLN
INTRAVENOUS | Status: AC
  Administered 2020-11-30: 12.5 g via INTRAVENOUS
  Filled 2020-11-30: qty 250

## 2020-11-30 MED ORDER — PHENYLEPHRINE HCL-NACL 10-0.9 MG/250ML-% IV SOLN
INTRAVENOUS | Status: AC
  Administered 2020-11-30: 130 ug/min via INTRAVENOUS
  Filled 2020-11-30: qty 250

## 2020-11-30 MED ORDER — FENTANYL CITRATE (PF) 100 MCG/2ML IJ SOLN
INTRAMUSCULAR | Status: AC
  Filled 2020-11-30: qty 2

## 2020-11-30 MED ORDER — HYDROMORPHONE HCL 1 MG/ML IJ SOLN
0.5000 mg | INTRAMUSCULAR | Status: DC | PRN
  Administered 2020-12-01: 1 mg via INTRAVENOUS
  Administered 2020-12-02: 2 mg via INTRAVENOUS
  Administered 2020-12-02: 1 mg via INTRAVENOUS
  Administered 2020-12-02 – 2020-12-03 (×3): 2 mg via INTRAVENOUS
  Administered 2020-12-04: 1 mg via INTRAVENOUS
  Administered 2020-12-04 – 2020-12-05 (×4): 2 mg via INTRAVENOUS
  Filled 2020-11-30 (×4): qty 2
  Filled 2020-11-30: qty 1
  Filled 2020-11-30: qty 2
  Filled 2020-11-30: qty 1
  Filled 2020-11-30 (×3): qty 2
  Filled 2020-11-30: qty 1

## 2020-11-30 MED ORDER — NOREPINEPHRINE 4 MG/250ML-% IV SOLN
2.0000 ug/min | INTRAVENOUS | Status: DC
  Administered 2020-11-30: 5 ug/min via INTRAVENOUS
  Filled 2020-11-30: qty 250

## 2020-11-30 MED ORDER — NOREPINEPHRINE 16 MG/250ML-% IV SOLN
0.0000 ug/min | INTRAVENOUS | Status: DC
  Administered 2020-11-30: 10 ug/min via INTRAVENOUS
  Administered 2020-12-01: 11 ug/min via INTRAVENOUS
  Administered 2020-12-02: 8 ug/min via INTRAVENOUS
  Administered 2020-12-05: 13 ug/min via INTRAVENOUS
  Administered 2020-12-07: 6 ug/min via INTRAVENOUS
  Filled 2020-11-30 (×8): qty 250

## 2020-11-30 MED ORDER — ONDANSETRON HCL 4 MG/2ML IJ SOLN
INTRAMUSCULAR | Status: AC
  Filled 2020-11-30: qty 2

## 2020-11-30 MED ORDER — ACETAMINOPHEN 10 MG/ML IV SOLN
INTRAVENOUS | Status: DC | PRN
  Administered 2020-11-30: 1000 mg via INTRAVENOUS

## 2020-11-30 MED ORDER — ACETAMINOPHEN 10 MG/ML IV SOLN
INTRAVENOUS | Status: AC
  Filled 2020-11-30: qty 100

## 2020-11-30 MED ORDER — PHENYLEPHRINE 40 MCG/ML (10ML) SYRINGE FOR IV PUSH (FOR BLOOD PRESSURE SUPPORT)
PREFILLED_SYRINGE | INTRAVENOUS | Status: AC
  Filled 2020-11-30: qty 10

## 2020-11-30 SURGICAL SUPPLY — 40 items
BAG COUNTER SPONGE SURGICOUNT (BAG) IMPLANT
BLADE SURG 15 STRL LF DISP TIS (BLADE) ×1 IMPLANT
BLADE SURG 15 STRL SS (BLADE) ×2
BRIEF STRETCH FOR OB PAD LRG (UNDERPADS AND DIAPERS) ×2 IMPLANT
COVER SURGICAL LIGHT HANDLE (MISCELLANEOUS) ×2 IMPLANT
DRAPE LAPAROTOMY T 102X78X121 (DRAPES) ×2 IMPLANT
DRSG KUZMA FLUFF (GAUZE/BANDAGES/DRESSINGS) ×4 IMPLANT
DRSG PAD ABDOMINAL 8X10 ST (GAUZE/BANDAGES/DRESSINGS) ×8 IMPLANT
DRSG TELFA 3X8 NADH (GAUZE/BANDAGES/DRESSINGS) ×2 IMPLANT
ELECT REM PT RETURN 15FT ADLT (MISCELLANEOUS) ×2 IMPLANT
GAUZE 4X4 16PLY ~~LOC~~+RFID DBL (SPONGE) ×2 IMPLANT
GAUZE SPONGE 4X4 12PLY STRL (GAUZE/BANDAGES/DRESSINGS) ×4 IMPLANT
GLOVE SURG ENC MOIS LTX SZ7.5 (GLOVE) ×2 IMPLANT
GLOVE SURG ENC MOIS LTX SZ8 (GLOVE) ×6 IMPLANT
GLOVE SURG LTX SZ8 (GLOVE) ×6 IMPLANT
GLOVE SURG UNDER LTX SZ8 (GLOVE) ×2 IMPLANT
GOWN STRL REUS W/ TWL LRG LVL3 (GOWN DISPOSABLE) ×1 IMPLANT
GOWN STRL REUS W/TWL LRG LVL3 (GOWN DISPOSABLE) ×2
GOWN STRL REUS W/TWL XL LVL3 (GOWN DISPOSABLE) ×4 IMPLANT
KIT BASIN OR (CUSTOM PROCEDURE TRAY) ×2 IMPLANT
KIT TURNOVER KIT A (KITS) ×2 IMPLANT
NEEDLE HYPO 22GX1.5 SAFETY (NEEDLE) ×2 IMPLANT
PACK BASIC VI WITH GOWN DISP (CUSTOM PROCEDURE TRAY) ×2 IMPLANT
PENCIL SMOKE EVACUATOR (MISCELLANEOUS) ×2 IMPLANT
PLUG CATH AND CAP STER (CATHETERS) ×2 IMPLANT
SUCTION FRAZIER HANDLE 12FR (TUBING)
SUCTION TUBE FRAZIER 12FR DISP (TUBING) IMPLANT
SURGILUBE 2OZ TUBE FLIPTOP (MISCELLANEOUS) ×2 IMPLANT
SUT CHROMIC 2 0 SH (SUTURE) IMPLANT
SUT CHROMIC 3 0 SH 27 (SUTURE) IMPLANT
SUT VIC AB 2-0 UR6 27 (SUTURE) ×2 IMPLANT
SWAB COLLECTION DEVICE MRSA (MISCELLANEOUS) ×2 IMPLANT
SWAB CULTURE ESWAB REG 1ML (MISCELLANEOUS) ×2 IMPLANT
SYR 20ML LL LF (SYRINGE) ×2 IMPLANT
SYR 3ML LL SCALE MARK (SYRINGE) IMPLANT
SYR BULB IRRIG 60ML STRL (SYRINGE) ×2 IMPLANT
TAPE CLOTH 4X10 WHT NS (GAUZE/BANDAGES/DRESSINGS) ×4 IMPLANT
TOWEL OR 17X26 10 PK STRL BLUE (TOWEL DISPOSABLE) ×2 IMPLANT
TOWEL OR NON WOVEN STRL DISP B (DISPOSABLE) ×2 IMPLANT
TRAY FOLEY MTR SLVR 16FR STAT (SET/KITS/TRAYS/PACK) ×2 IMPLANT

## 2020-11-30 NOTE — Anesthesia Procedure Notes (Signed)
Procedure Name: Intubation Date/Time: 11/30/2020 4:17 AM Performed by: Raenette Rover, CRNA Pre-anesthesia Checklist: Patient identified, Emergency Drugs available, Suction available and Patient being monitored Patient Re-evaluated:Patient Re-evaluated prior to induction Oxygen Delivery Method: Circle system utilized Preoxygenation: Pre-oxygenation with 100% oxygen Induction Type: IV induction and Rapid sequence Laryngoscope Size: Mac and 3 Grade View: Grade I Tube type: Oral Tube size: 7.0 mm Number of attempts: 1 Airway Equipment and Method: Stylet Placement Confirmation: ETT inserted through vocal cords under direct vision, positive ETCO2 and breath sounds checked- equal and bilateral Secured at: 22 cm Tube secured with: Tape Dental Injury: Teeth and Oropharynx as per pre-operative assessment

## 2020-11-30 NOTE — Transfer of Care (Addendum)
Immediate Anesthesia Transfer of Care Note  Patient: James Coffey  Procedure(s) Performed: INCISION AND DEBRIDEMENT OF PERIRECTAL ABSCESS AND RECTAL WALL BIOPSIES (Scrotum)  Patient Location: PACU  Anesthesia Type:General  Level of Consciousness: awake, alert , oriented and patient cooperative  Airway & Oxygen Therapy: Patient Spontanous Breathing and Patient connected to face mask oxygen  Post-op Assessment: Report given to RN and Post -op Vital signs reviewed and stable  Post vital signs: Reviewed and stable--MDA in PACU and aware of BP  Last Vitals:  Vitals Value Taken Time  BP 86/61 0534  Temp    Pulse 112   Resp    SpO2 98%     Last Pain:  Vitals:   11/30/20 0200  TempSrc: Rectal  PainSc:          Complications: No notable events documented.

## 2020-11-30 NOTE — H&P (Signed)
James Coffey  May 23, 1971 [id number]  CARE TEAM:  PCP: Pcp, No  Outpatient Care Team: Patient Care Team: Pcp, No as PCP - General  Inpatient Treatment Team: Treatment Team: Attending Provider: Merrily Pew, MD; Registered Nurse: Herbie Drape, RN; Technician: Blanchard Mane, NT; Consulting Physician: Edison Pace, Md, MD   This patient is a 50 y.o.male who presents today for surgical evaluation at the request of Dr Dayna Barker.   Chief complaint / Reason for evaluation: Worsening perirectal abscess and pain with probable Fournier's gangrene  Pleasant patient originally from [place].  Primarily speaks [place] in [place].  Can speak some English.  Came to the hospital a month ago with a spontaneously draining perirectal abscess.  He was admitted placed on IV antibiotics and seemed to improve.  Switched over to oral antibiotics.  He will gastrology was consulted.  They felt no emergent need for endoscopy at this time but thought it would be wise to consider colonoscopy given his age and perirectal findings.  The patient followed up with my colorectal partner, Dr. [name], last week.  Had some inflammation but seem to be gradually recovering.  Patient notes that he had worsening pain and chills the past few days.  A lot more urinary frequency.  More irregular urgent bowels.  Pain became unbearable.  Came tonight to the emergency room.  Very tender scrotum and mons pubis and perirectal region.  CT scan shows gas surrounding much of the scrotum and base of the penis and going up on the mons pubis to the lower abdomen.  He is tachycardic in early shock with chills and rigors.  Consultations made with general surgery and urology for emergent evaluation.  Patient is a smoker.  He has been trying to manage this with over-the-counter pain medicines but struggling.  No major cardiac or pulmonary issues.  Relatively active.  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   Assessment  James Coffey  50 y.o.  male       Problem List:  Principal Problem:   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 Active Problems:   Perirectal abscess   Tobacco abuse   Partiail-English speaking patient - Prefers [place] or [place]   Worsening pain inflammation and gas going from right perirectal region down to genitalia and mons pubis highly suspicious for any's gangrene with evidence of shock and rigors  Plan:  IV fluid resuscitation  Aggressive IV antibiotics.  Broad  Emergent operative exploration.  Dr. Alinda Money with [facility] has [facility].  He reviewed the CAT scan and agrees with emergent operative exploration combined urological and surgical approach.  I discussed this with the patient with the help of an phone interpreter that spoke [place].  He had some partial comprehension in [place] but felt more comfortable discussing with [place].  He understands that this is an emergent surgery.  I suspect he is going to need repeated operative explorations and debridement.  We will work to try and control infection without obliterating his genitalia and perirectal region.  The anatomy and physiology of the region was discussed. The pathophysiology of subcutaneous abscess formation with progression to fasciitis & sepsis was discussed.  Need for incision, drainage, debridement discussed.  I stressed good hygiene & need for repeated wound care.  Possible redebridement / reoperation was discussed as well. Possibility of recurrence was discussed.   Risks of bleeding, infection, abscess, leak, injury to other organs, need for repair of tissues / organs, need for further treatment, heart attack, death, and  other risks were discussed.  Benefits, alternatives were discussed. I noted a good likelihood this will help address the problem.  Questions answered.  The patient agrees to proceed.  -Smoking cessation  -VTE prophylaxis- SCDs, etc  -mobilize as tolerated to help recovery  30 minutes spent in review, evaluation,  examination, counseling, and coordination of care.  More than 50% of that time was spent in counseling.  Adin Hector, MD, FACS, MASCRS Esophageal, Gastrointestinal & Colorectal Surgery Robotic and Minimally Invasive Surgery  [facility] [address], [place], [place] [address] [phone] Fax [phone] Main/Paging  CONTACT INFORMATION: Weekday (9AM-5PM) concerns: Call [facility] main office at [phone] Weeknight (5PM-9AM) or Weekend/Holiday concerns: Check [email or web address] for General Surgery [facility] coverage (Please, do not use SecureChat as it is not reliable communication to operating surgeons for immediate patient care)      11/30/2020      History reviewed. No pertinent past medical history.  Past Surgical History:  Procedure Laterality Date   HERNIA REPAIR     INGUINAL HERNIA REPAIR Right 2007    Social History   Socioeconomic History   Marital status: Married    Spouse name: Not on file   Number of children: Not on file   Years of education: Not on file   Highest education level: Not on file  Occupational History   Occupation: truck driver  Tobacco Use   Smoking status: Every Day    Packs/day: 1.00    Pack years: 0.00    Types: Cigarettes   Smokeless tobacco: Never  Vaping Use   Vaping Use: Never used  Substance and Sexual Activity   Alcohol use: No   Drug use: No   Sexual activity: Not on file  Other Topics Concern   Not on file  Social History Narrative   From [place].  Came to the [place] in 2000.   Social Determinants of Health   Financial Resource Strain: Not on file  Food Insecurity: Not on file  Transportation Needs: Not on file  Physical Activity: Not on file  Stress: Not on file  Social Connections: Not on file  Intimate Partner Violence: Not on file    Family History  Problem Relation Age of Onset   CAD Neg Hx    Inflammatory bowel disease Neg Hx     Current Facility-Administered Medications   Medication Dose Route Frequency Provider Last Rate Last Admin   lactated ringers bolus 1,000 mL  1,000 mL Intravenous Once Mesner, Corene Cornea, MD       piperacillin-tazobactam (ZOSYN) IVPB 3.375 g  3.375 g Intravenous Tor Netters, MD       vancomycin (VANCOREADY) IVPB 1500 mg/300 mL  1,500 mg Intravenous Once Thomes Lolling, RPH 150 mL/hr at 11/30/20 0128 Restarted at 11/30/20 0128   Current Outpatient Medications  Medication Sig Dispense Refill   acetaminophen (TYLENOL) 325 MG tablet Take 2 tablets (650 mg total) by mouth every 6 (six) hours as needed for mild pain (or Fever >/= 101).     ibuprofen (ADVIL) 200 MG tablet Take 200 mg by mouth every 6 (six) hours as needed for mild pain.       No Known Allergies  ROS:   All other systems reviewed & are negative except per HPI or as noted below: Constitutional:  ++ fevers, +++chills, +sweats.  Weight stable Eyes:  No vision changes, No discharge HENT:  No sore throats, nasal drainage Lymph: No neck swelling, No bruising easily Pulmonary:  No cough, productive sputum CV: No orthopnea, PND  Patient walks 30 minutes for about 2 miles without difficulty.  No exertional chest/neck/shoulder/arm pain. GI:  No personal nor family history of GI/colon cancer, inflammatory bowel disease, irritable bowel syndrome, allergy such as Celiac Sprue, dietary/dairy problems, colitis, ulcers nor gastritis.  No recent sick contacts/gastroenteritis.  No travel outside the country.  No changes in diet. Renal: No UTIs, No hematuria.  Urinary frequency and hesitancy Genital:  No urethral drainage, bleeding, masses Musculoskeletal: No severe joint pain.  Good ROM major joints Skin:  No sores or lesions.  No rashes Heme/Lymph:  No easy bleeding.  No swollen lymph nodes Neuro: No focal weakness/numbness.  No seizures Psych: No suicidal ideation.  No hallucinations  BP 122/71   Pulse (!) 119   Temp 99.3 F (37.4 C) (Rectal)   Resp (!) 22   SpO2 96%    Physical Exam: Constitutional: Thin but not cachectic.  Hygeine adequate.  In moderate distress with chills.  Vitals signs as above.   Eyes: Pupils reactive, normal extraocular movements. Sclera nonicteric Neuro: CN II-XII intact.  No major focal sensory defects.  No major motor deficits. Lymph: No head/neck/groin lymphadenopathy Psych:  No severe agitation.  No severe anxiety.  Judgment & insight Adequate, Oriented x4, HENT: Normocephalic, Mucus membranes moist.  No thrush.   Neck: Supple, No tracheal deviation.  No obvious thyromegaly Chest: No pain to chest wall compression.  Good respiratory excursion.  No audible wheezing CV:  Pulses intact.  Regular rhythm.  No major extremity edema Abdomen:  Soft.  Upper abdomen Nondistended.  Tenderness at mons pubis suprapubic region with crepitus . No incarcerated hernias.  No hepatomegaly.  No splenomegaly Gen: Scrotum and penis swollen with some crepitus.  No gangrene but uncomfortable.  This continues along the midline raphae to the right perirectal region no inguinal hernias.  No inguinal lymphadenopathy.   Rectal.  Drainage in perirectal region spontaneously draining abscess.  Loose stool. Ext: No obvious deformity or contracture no significant edema.  No cyanosis Skin: No major subcutaneous nodules.  Warm and dry Musculoskeletal: Severe joint rigidity not present.  No obvious clubbing.  No digital petechiae.     Results:   Labs: Results for orders placed or performed during the hospital encounter of 11/29/20 (from the past 48 hour(s))  CBC with Differential     Status: Abnormal   Collection Time: 11/29/20 11:02 PM  Result Value Ref Range   WBC 30.9 (H) 4.0 - 10.5 K/uL   RBC 4.42 4.22 - 5.81 MIL/uL   Hemoglobin 12.8 (L) 13.0 - 17.0 g/dL   HCT 38.8 (L) 39.0 - 52.0 %   MCV 87.8 80.0 - 100.0 fL   MCH 29.0 26.0 - 34.0 pg   MCHC 33.0 30.0 - 36.0 g/dL   RDW 13.5 11.5 - 15.5 %   Platelets 348 150 - 400 K/uL   nRBC 0.0 0.0 - 0.2 %    Neutrophils Relative % 72 %   Band Neutrophils 18 %   Lymphocytes Relative 5 %   Monocytes Relative 5 %   Eosinophils Relative 0 %   Basophils Relative 0 %   WBC Morphology NORMAL    RBC Morphology NORMAL    Smear Review DONE    nRBC NONE SEEN 0 /100 WBC   Metamyelocytes Relative NONE SEEN %   Myelocytes NONE SEEN %   Promyelocytes Relative NONE SEEN %   Blasts NONE SEEN %    Comment: Performed at  [facility], [place] [address] [place], [place] [address]  Comprehensive metabolic panel     Status: Abnormal   Collection Time: 11/29/20 11:02 PM  Result Value Ref Range   Sodium 130 (L) 135 - 145 mmol/L   Potassium 4.0 3.5 - 5.1 mmol/L   Chloride 94 (L) 98 - 111 mmol/L   CO2 23 22 - 32 mmol/L   Glucose, Bld 166 (H) 70 - 99 mg/dL    Comment: Glucose reference range applies only to samples taken after fasting for at least 8 hours.   BUN 15 6 - 20 mg/dL   Creatinine, Ser 1.34 (H) 0.61 - 1.24 mg/dL   Calcium 9.2 8.9 - 10.3 mg/dL   Total Protein 8.0 6.5 - 8.1 g/dL   Albumin 3.2 (L) 3.5 - 5.0 g/dL   AST 20 15 - 41 U/L   ALT 23 0 - 44 U/L   Alkaline Phosphatase 116 38 - 126 U/L   Total Bilirubin 1.1 0.3 - 1.2 mg/dL   GFR, Estimated >60 >60 mL/min    Comment: (NOTE) Calculated using the CKD-EPI Creatinine Equation (2021)    Anion gap 13 5 - 15    Comment: Performed at [facility], [place] [address], [place], [place] [address]  Lactic acid, plasma     Status: Abnormal   Collection Time: 11/29/20 11:02 PM  Result Value Ref Range   Lactic Acid, Venous 3.2 (HH) 0.5 - 1.9 mmol/L    Comment: CRITICAL RESULT CALLED TO, READ BACK BY AND VERIFIED WITH: TABITHA LOCKETT AT 0105 ON 11/30/20 BY Buel Ream Performed at [facility], [place] [address], [place], [place] [address]     Imaging / Studies: CT ABDOMEN PELVIS WO CONTRAST  Result Date: 11/03/2020 CLINICAL DATA:  Abdominal pain and fever. Perirectal abscess. Patient reports intermittent  lower abdominal pain for 3 months. Rectal pain for 3-4 days. EXAM: CT ABDOMEN AND PELVIS WITHOUT CONTRAST TECHNIQUE: Multidetector CT imaging of the abdomen and pelvis was performed following the standard protocol without IV contrast. COMPARISON:  None. FINDINGS: Lower chest: Clear lung bases. Hepatobiliary: No focal liver abnormality is seen. No gallstones, gallbladder wall thickening, or biliary dilatation. Pancreas: Unremarkable. No pancreatic ductal dilatation or surrounding inflammatory changes. Spleen: Normal in size without focal abnormality. Adrenals/Urinary Tract: No adrenal masses. Kidneys normal size, orientation and position. Vague low-attenuation lesion from the medial upper pole of the right kidney, proximally 9 mm in size, not fully characterized, but likely a cyst. No other renal masses or lesions, no stones and no hydronephrosis. Normal ureters. Normal bladder. Stomach/Bowel: Eccentric thickening of the inferior rectal wall. Middle and upper rectum are unremarkable. Colon and small bowel are normal in caliber. No wall thickening. No inflammation. Normal appearance of the stomach. No evidence of appendicitis. Vascular/Lymphatic: Aortic atherosclerosis. No aneurysm. Prominent right inguinal lymph nodes, largest measuring 1 cm short axis. No other adenopathy. Reproductive: Prostate is unremarkable. Other: There is soft tissue air and inflammation along the medial aspect of the right buttock and right perineum with ill-defined soft tissue surrounding anus. Inflammation extends to the inferior rectum just above the levator sling. There is no defined fluid collection to suggest an abscess. No ascites. Musculoskeletal: No fracture or acute finding. No bone lesion. No bone resorption to suggest osteomyelitis. IMPRESSION: 1. Perirectal/perianal infection reflected by soft tissue inflammation and soft tissue air, along with thickening of the inferior rectal wall. Mild inflammatory change extends just above  the levator sling adjacent to the inferior  rectum. There is no fluid collection to suggest an abscess, however. There are prominent right inguinal lymph nodes, presumed reactive to the perineal inflammation. 2. No other acute abnormality within the abdomen or pelvis. 3. Aortic atherosclerosis. Electronically Signed   By: Lajean Manes M.D.   On: 11/03/2020 18:33   CT ABDOMEN PELVIS W CONTRAST  Result Date: 11/30/2020 CLINICAL DATA:  Lower abdominal pain.  Recent perianal abscess. EXAM: CT ABDOMEN AND PELVIS WITH CONTRAST TECHNIQUE: Multidetector CT imaging of the abdomen and pelvis was performed using the standard protocol following bolus administration of intravenous contrast. CONTRAST:  18mL OMNIPAQUE IOHEXOL 300 MG/ML  SOLN COMPARISON:  Abdominopelvic CT 11/03/2020 FINDINGS: Lower chest: No acute airspace disease or pleural effusion. Heart is normal in size. Hepatobiliary: Borderline hepatic steatosis. No focal liver lesion. Gallbladder physiologically distended, no calcified stone. No biliary dilatation. Pancreas: No ductal dilatation or inflammation. Mild motion artifact through the pancreas. Spleen: Normal in size without focal abnormality. Adrenals/Urinary Tract: Minimal left adrenal thickening. No dominant adrenal nodule. No hydronephrosis or perinephric edema. Homogeneous renal enhancement with symmetric excretion on delayed phase imaging. Small cyst in the medial right kidney is unchanged. Urinary bladder is physiologically distended without wall thickening. Stomach/Bowel: Inflammation and soft tissue air, no drainable collection mild rectal wall thickening. No additional sites of colonic wall thickening. Moderate colonic stool burden. Appendix not definitively visualized, no evidence of appendicitis. Decompressed small bowel without inflammation. Unremarkable stomach. Vascular/Lymphatic: Moderate aortic atherosclerosis with calcified noncalcified atheromatous plaque. Patent portal vein. Patent  splenic veins. There is an irregular presacral nodule measuring 2.2 cm, series 2, image 74, this may represent an enlarged lymph node but is nonspecific. There prominent bilateral inguinal lymph nodes. Reproductive: Peroneal air previously localized to the right buttock and peritoneal and has spread anteriorly into the base of the penis and both scrotum. Patchy air seen within both scrotal sacs, right greater than left. Minimal air tracks adjacent to the left aspect of the prostate. Other: Air in soft tissue inflammation about the right buttock has progressed, with air-fluid collection in the right perianal region. Patchy soft tissue gas extends anteriorly into the perineum into both scrotum, base of the penis, and anterior abdominal wall. Few foci of soft tissue gas track adjacent to the prostate gland. No frank pneumoperitoneum or ascites. Musculoskeletal: There are no acute or suspicious osseous abnormalities. Occasional bone islands in the pelvis. IMPRESSION: 1. Worsening perineal inflammation, including right peri-rectal. Significant progression of soft tissue gas, now extending anteriorly into the perineum, base of the penis, both scrotal sacs and anterior abdominal wall. Findings are highly suspicious for necrotizing soft tissue infection, and urgent surgical consult is recommended. 2. Irregular presacral nodule measuring 2.2 cm may represent an enlarged lymph node but is nonspecific. Recommend follow-up after course of treatment. There is rectal wall thickening which may be inflammatory, however neoplasm is not excluded. Aortic Atherosclerosis (ICD10-I70.0). These results were called by telephone at the time of interpretation on 11/30/2020 at 2:01 am to provider Lattie Haw, who will convery the results urgently to [facility] , who verbally acknowledged these results. Electronically Signed   By: Keith Rake M.D.   On: 11/30/2020 02:02    Medications / Allergies: per chart  Antibiotics: Anti-infectives  (From admission, onward)    Start     Dose/Rate Route Frequency Ordered Stop   11/30/20 0245  piperacillin-tazobactam (ZOSYN) IVPB 3.375 g        3.375 g 12.5 mL/hr over 240 Minutes Intravenous Every 8 hours 11/30/20 0237  11/30/20 0230  clindamycin (CLEOCIN) IVPB 600 mg  Status:  Discontinued        600 mg 100 mL/hr over 30 Minutes Intravenous  Once 11/30/20 0219 11/30/20 0237   11/30/20 0000  ceFEPIme (MAXIPIME) 2 g in sodium chloride 0.9 % 100 mL IVPB        2 g 200 mL/hr over 30 Minutes Intravenous  Once 11/29/20 2347 11/30/20 0033   11/30/20 0000  vancomycin (VANCOREADY) IVPB 1500 mg/300 mL        1,500 mg 150 mL/hr over 120 Minutes Intravenous  Once 11/29/20 2347     11/29/20 2345  vancomycin (VANCOCIN) IVPB 1000 mg/200 mL premix  Status:  Discontinued        1,000 mg 200 mL/hr over 60 Minutes Intravenous  Once 11/29/20 2340 11/29/20 2347   11/29/20 2345  ceFEPIme (MAXIPIME) 1 g in sodium chloride 0.9 % 100 mL IVPB  Status:  Discontinued        1 g 200 mL/hr over 30 Minutes Intravenous  Once 11/29/20 2340 11/29/20 2347         Note: Portions of this report may have been transcribed using voice recognition software. Every effort was made to ensure accuracy; however, inadvertent computerized transcription errors may be present.   Any transcriptional errors that result from this process are unintentional.    Adin Hector, MD, FACS, MASCRS Esophageal, Gastrointestinal & Colorectal Surgery Robotic and Minimally Invasive Surgery  [facility] [address] [place],  [address] [phone] Fax [phone] Main/Paging  CONTACT INFORMATION: Weekday (9AM-5PM) concerns: Call [facility] main office at [phone] Weeknight (5PM-9AM) or Weekend/Holiday concerns: Check [email or web address] for General Surgery [facility] coverage (Please, do not use SecureChat as it is not reliable communication to operating surgeons for immediate patient care)       11/30/2020  2:37 AM

## 2020-11-30 NOTE — Anesthesia Preprocedure Evaluation (Addendum)
Anesthesia Evaluation  Patient identified by MRN, date of birth, ID band Patient awake    Reviewed: Allergy & Precautions, NPO status , Patient's Chart, lab work & pertinent test results  Airway Mallampati: II  TM Distance: >3 FB Neck ROM: Full    Dental  (+) Edentulous Lower, Dental Advisory Given, Missing,    Pulmonary Current Smoker,    Pulmonary exam normal        Cardiovascular negative cardio ROS   Rhythm:Regular Rate:Normal     Neuro/Psych negative neurological ROS  negative psych ROS   GI/Hepatic Neg liver ROS,   Endo/Other  negative endocrine ROS  Renal/GU      Musculoskeletal   Abdominal Normal abdominal exam  (+)   Peds  Hematology   Anesthesia Other Findings   Reproductive/Obstetrics                            Anesthesia Physical Anesthesia Plan  ASA: 3 and emergent  Anesthesia Plan: General   Post-op Pain Management:    Induction: Intravenous, Rapid sequence and Cricoid pressure planned  PONV Risk Score and Plan: 2 and Ondansetron and Midazolam  Airway Management Planned: Oral ETT  Additional Equipment: None  Intra-op Plan:   Post-operative Plan: Extubation in OR  Informed Consent: I have reviewed the patients History and Physical, chart, labs and discussed the procedure including the risks, benefits and alternatives for the proposed anesthesia with the patient or authorized representative who has indicated his/her understanding and acceptance.     Dental advisory given  Plan Discussed with: CRNA  Anesthesia Plan Comments:         Anesthesia Quick Evaluation

## 2020-11-30 NOTE — Op Note (Signed)
11/30/2020  5:32 AM  PATIENT:  James Coffey  50 y.o. 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  PROCEDURE:   INCISION AND DEBRIDEMENT OF PERIRECTAL, SCROTAL, PENILE, ABDOMINAL REGIONS ANORECTAL EXAMINATION UNDER ANESTHESIA  RECTAL WALL BIOPSIES  CO-SURGEON:   Adin Hector, MD Dutch Gray, MD  ANESTHESIA:   general  EBL:  Total I/O In: 5465 [I.V.:1000; IV Piggyback:650] Out: 25 [Blood:25]  Delay start of Pharmacological VTE agent (>24hrs) due to surgical blood loss or risk of bleeding:  no  DRAINS: none   SPECIMEN:   Abscess/purulence for aerobic and anaerobic culture Necrotic/gangrenous tissue of mons pubis and scrotum for culture and pathology Left lateral and right lateral rectal wall biopsies for pathology  COUNTS:  YES  PLAN OF CARE: Admit to inpatient   PATIENT DISPOSITION:  PACU - guarded condition.  INDICATION: Pleasant patient who had pain and rectal abscess spontaneously draining.  Was admitted placed on IV antibiotics and appeared to improve.  Had outpatient follow-up with some discomfort and inflammation but no worsening off antibiotics.  Unfortunately developed increasing pain and discomfort the past few days to the point of having chills rigors and severe pain.  Urinary urgency and some urge incontinence.  The anatomy and physiology of skin abscesses was discussed. Pathophysiology of SQ abscess, possible progression to fasciitis & sepsis, etc discussed . I stressed good hygiene & wound care. Possible redebridement was discussed as well.   Possibility of recurrence was discussed. Risks, benefits, alternatives were discussed. I noted a good likelihood this will help address the problem. Risks of anesthesia and other risks discussed. Questions answered. The patient is does wish to proceed.   OR FINDINGS:   Right  posterior lateral 2 cm spontaneously draining perirectal wound.  Purulence and gas tracks to posterior gluteal space, right lateral rectal wall up into the true pelvis superior to the levators, along the right peroneal groove between thigh and scrotum.  Gas also tracks into right and left scrotum.  Gas and purulence wraps around base of penis and goes up the anterior shaft of the penis.  Tracks up over the pubic bone into the mons pubis and suprapubic subcutaneous tissues.  Giant incisions from right posterior buttock to right anterior thigh and scrotum.  Counterincisions in low suprapubic and infraumbilical regions.  CASE DATA:  Type of patient?: LDOW CASE (Surgical Hospitalist [facility] Inpatient)  Status of Case? EMERGENT Add On  Infection Present At Time Of Surgery (PATOS)?   Purulence, infection, gangrene  DESCRIPTION:   Informed consent was confirmed. The patient received IV antibiotics. The patient underwent general anesthesia without any difficulty. The patient was positioned in low lithotomy. SCDs were active during the entire case. The area around the abscess was prepped and draped in a sterile fashion. A surgical timeout confirmed our plan.   Dr. Alinda Money sterilely placed a Foley catheter through the penile meatus without difficulty.  I probed into the spontaneously draining right posterior lateral perirectal wound.  I can easily finger into the posterior gluteal region as well as along the right rectal wall extrasphincteric.  Could easily pass my finger along the right perineum up to the scrotum.  It did not track onto the right medial thigh.  We decided to extend an incision from the perirectal wound along the right perineum and veer towards the posterior midline scrotum.  Dr. Alinda Money opened up both scrotal  cavities to confirm viable testes.  Gas tracked along the scrotum but skin was viable.  I did a counterincision on the mons pubis initially 6 cm extended to 10 cm transversely just above  the pubic rami.  Encountered purple-gray foul purulence abscess suspicious for Fournier's gangrene/necrotizing fasciitis.  I could track my finger into the infraumbilical subcutaneous tissues of the abdominal wall.  Finger contracture a few centimeters below the umbilicus.  I did another counterincision there and encountered no purulence.  Fascia looked viable.  I incised and confirmed nice viable muscle.  We returned attention to the higher focus of purulence.  We end up extending the right perineal incision more cephalad along the right perineal groove.  Dr. Alinda Money sharply debrided necrotic tissue around the base of the corpora cavernosa as it exited the inferior border of the pubic ramus.  This better decompressed purulence.  Sharply debrided until healthy bleeding tissue.  We assured hemostasis.  Return down to the right perirectal region tissues were viable.  I did anorectal examination.  His sphincter was abnormal and somewhat obliterated.  His rectum was thickened hard increases with irregular folds suspicious for possible rectal cancer.  Could feel narrowing 7-8 cm to the tip of my finger.  Could feel some nodularity on the right and left rectal wall.  I tried to get a view with Hill-Ferguson's but it was narrowed and visualization was poor.  I was able to use Allis and Kelly clamps to take bites of suspicious nodularity on the left lateral right lateral rectal walls.  Sent that for pathology as I was concerned this could be malignancy.  Another possibility could be Crohn's or inflammatory bowel disease but he did not have any strong evidence of water can fistulous disease.  Hemostasis is okay.  Again palpated the rectum and cannot confirm any obvious rectal wall perforation but digital rectal examination did express some purulence in the base of the scrotum.  We did reexploration of all the wounds and noted all purulence had been decompressed and remaining tissue appeared to be adequately viable.  We  used large 6 inch Kerlix rolls x2.  Used 1 to pack the infraumbilical and mons pubis incisions going down to the base of the penis.  Another one going into the posterior buttock, along the rectal wall up into the true pelvis superior to the levators, along the perineum and posterior scrotum into the deep midline raphae.  We felt hemostasis was adequate.  I held off on considering fecal diversion at this time.  Given his evidence of septic shock with chills and significant Fournier's gangrene/necrotizing fasciitis, I felt it would be wise to have the patient stepdown unit to follow for hemodynamic lability and resuscitation.  Aggressive IV antibiotics.  Will anticipate second-look operation in the next 24-48 hours.  I discussed operative findings, updated the patient's status, discussed probable steps to recovery, and gave postoperative recommendations to the patient's daughter, Mithran Strike .  Recommendations were made.  Questions were answered.  She expressed understanding & appreciation.       Adin Hector, M.D., F.A.C.S. Gastrointestinal and Minimally Invasive Surgery [facility] [address], [place], [place] [address] [phone] Main / Paging

## 2020-11-30 NOTE — Interval H&P Note (Signed)
History and Physical Interval Note:  11/30/2020 3:51 AM  James Coffey  has presented today for surgery, with the diagnosis of FOURNIER'S GANGRENE.  The various methods of treatment have been discussed with the patient and family. After consideration of risks, benefits and other options for treatment, the patient has consented to  Procedure(s): Spring (N/A) as a surgical intervention.  The patient's history has been reviewed, patient examined, no change in status, stable for surgery.  I have reviewed the patient's chart and labs.  Questions were answered to the patient's satisfaction.     Adin Hector

## 2020-11-30 NOTE — Op Note (Addendum)
Preoperative diagnosis: Fournier's gangrene  Postoperative diagnosis: Fournier's gangrene  Procedure: Incision, drainage, and debridement of perineum/scrotum/penis  Co-surgeons: Pryor Curia MD and Dr. Neysa Bonito  Anesthesia: General  Complications: None  EBL: 50 cc  Specimens: Per Dr. Johney Maine  Indication: James Coffey is a 50 year old gentleman who recently presented with a draining perirectal abscess.  It was felt that he did not require surgical treatment at initial presentation.  He returned to the emergency department tonight with worsening pain and swelling of his perineum, scrotum, and mons pubis.  CT imaging revealed extensive gas throughout the perineum, scrotum, and lower abdomen.  He was evaluated by general surgery and urologic consultation was obtained.  It was decided to take him to the operating room emergently for incision/drainage/debridement.  The potential risks, complications, etc. were discussed.  Informed consent was obtained.  Description of procedure: The initial portion of the procedure will be dictated by Dr. Johney Maine.  A 16 Fr Foley catheter was placed in order to identify the urethra intraoperatively. The patient was noted to have an area of drainage in the right perirectal area.  The initial incision was made by Dr. Johney Maine and his portion of the procedure will be dictated separately.  After an incision was made anteriorly in the perineum toward the posterior scrotum, I was able to evaluate the patient in the scrotal tissue appeared to be viable.  Dr. Johney Maine then made a horizontal incision in the mons pubis where purulent drainage was noted.  This was sent for culture and will be dictated further by him.  Both from above and below, there was noted to be tracking of this cavity toward the right hemiscrotum and surrounding the penile shaft.  Again, the skin appeared to be viable and the incisions from above and below appear to drain this area quite adequately.  In addition,  there was noted to be an area tracking toward the left side of the scrotum which also was open.  There was noted to be some necrotic tissue both over the mons pubis as well as toward the right lateral aspect near the corpora cavernosa.  Some of this tissue was debrided per Dr. Johney Maine.  Additional tissue was debrided by me surrounding the corpora cavernosa.  There was no necrosis of the corporal tissue. The testicles appeared normal and uninvolved.  The penis and urethra were uninvolved but were identified and all necrotic tissue around the corpora was resected.  Packing with chlorhexidine was placed in the wounds surrounding the genitalia but the penile and scrotal skin was able to be kept intact with plans to re-examine in the OR in the next 48 hrs or so.  Please see Dr. Johney Maine' note for his portion of the procedure.

## 2020-11-30 NOTE — Consult Note (Signed)
Urology Consult   Physician requesting consult: Dr. Dolly Rias  Reason for consult: Fournier's gangrene  History of Present Illness: James Coffey is a 50 y.o. gentleman who was admitted a few weeks ago and was noted to have a perirectal abscess that had drained spontaneously.  She was evaluated by general surgery and it was felt that surgical therapy was not indicated at that time.  He was seen in the outpatient surgery clinic and appeared to be improving last week.  He returned to the ER tonight with complaints of worsening perineal and scrotal and lower abdominal pain.  He denies fever but has had chills/rigors.  CT imaging reveals a large amount of gas extending from the perineum up through the scrotum/penis and up the lower abdomen.  He was evaluated by Dr. Johney Maine who is planning on surgical debridement.  He has received Vancomycin, Cefepime, and Zosyn.   History reviewed. No pertinent past medical history.  Past Surgical History:  Procedure Laterality Date   HERNIA REPAIR     INGUINAL HERNIA REPAIR Right 2007    Medications:  Home meds:  No current facility-administered medications on file prior to encounter.   Current Outpatient Medications on File Prior to Encounter  Medication Sig Dispense Refill   acetaminophen (TYLENOL) 325 MG tablet Take 2 tablets (650 mg total) by mouth every 6 (six) hours as needed for mild pain (or Fever >/= 101).     ibuprofen (ADVIL) 200 MG tablet Take 200 mg by mouth every 6 (six) hours as needed for mild pain.       Scheduled Meds: Continuous Infusions:  piperacillin-tazobactam (ZOSYN)  IV     PRN Meds:.  Allergies: No Known Allergies  Family History  Problem Relation Age of Onset   CAD Neg Hx    Inflammatory bowel disease Neg Hx     Social History:  reports that he has been smoking cigarettes. He has been smoking an average of 1.00 packs per day. He has never used smokeless tobacco. He reports that he does not drink alcohol and does not use  drugs.  ROS: A complete review of systems was performed.  All systems are negative except for pertinent findings as noted.  Physical Exam:  Vital signs in last 24 hours: Temp:  [98.1 F (36.7 C)-99.3 F (37.4 C)] 99.3 F (37.4 C) (06/25 0200) Pulse Rate:  [100-123] 119 (06/25 0141) Resp:  [16-22] 22 (06/25 0141) BP: (87-122)/(66-71) 122/71 (06/25 0141) SpO2:  [96 %-98 %] 96 % (06/25 0141) Constitutional:  Alert and oriented, No acute distress Cardiovascular: Regular and tachycardic Respiratory: Normal respiratory effort, Lungs clear bilaterally GI: Abdomen is soft, very firm and prominent mons pubis with significant tenderness but not erythematous  Genitourinary: Normal male phallus with erythema and edema but no crepitus, normal meatus, testes are descended with moderate erythema and tenderness, no clear crepitus. Perineum with tenderness and erythema. Lymphatic: No lymphadenopathy Neurologic: Grossly intact, no focal deficits Psychiatric: Normal mood and affect  Laboratory Data:  Recent Labs    11/29/20 2302  WBC 30.9*  HGB 12.8*  HCT 38.8*  PLT 348    Recent Labs    11/29/20 2302  NA 130*  K 4.0  CL 94*  GLUCOSE 166*  BUN 15  CALCIUM 9.2  CREATININE 1.34*     Results for orders placed or performed during the hospital encounter of 11/29/20 (from the past 24 hour(s))  CBC with Differential     Status: Abnormal   Collection Time: 11/29/20 11:02 PM  Result Value Ref Range   WBC 30.9 (H) 4.0 - 10.5 K/uL   RBC 4.42 4.22 - 5.81 MIL/uL   Hemoglobin 12.8 (L) 13.0 - 17.0 g/dL   HCT 38.8 (L) 39.0 - 52.0 %   MCV 87.8 80.0 - 100.0 fL   MCH 29.0 26.0 - 34.0 pg   MCHC 33.0 30.0 - 36.0 g/dL   RDW 13.5 11.5 - 15.5 %   Platelets 348 150 - 400 K/uL   nRBC 0.0 0.0 - 0.2 %   Neutrophils Relative % 72 %   Band Neutrophils 18 %   Lymphocytes Relative 5 %   Monocytes Relative 5 %   Eosinophils Relative 0 %   Basophils Relative 0 %   WBC Morphology NORMAL    RBC  Morphology NORMAL    Smear Review DONE    nRBC NONE SEEN 0 /100 WBC   Metamyelocytes Relative NONE SEEN %   Myelocytes NONE SEEN %   Promyelocytes Relative NONE SEEN %   Blasts NONE SEEN %  Comprehensive metabolic panel     Status: Abnormal   Collection Time: 11/29/20 11:02 PM  Result Value Ref Range   Sodium 130 (L) 135 - 145 mmol/L   Potassium 4.0 3.5 - 5.1 mmol/L   Chloride 94 (L) 98 - 111 mmol/L   CO2 23 22 - 32 mmol/L   Glucose, Bld 166 (H) 70 - 99 mg/dL   BUN 15 6 - 20 mg/dL   Creatinine, Ser 1.34 (H) 0.61 - 1.24 mg/dL   Calcium 9.2 8.9 - 10.3 mg/dL   Total Protein 8.0 6.5 - 8.1 g/dL   Albumin 3.2 (L) 3.5 - 5.0 g/dL   AST 20 15 - 41 U/L   ALT 23 0 - 44 U/L   Alkaline Phosphatase 116 38 - 126 U/L   Total Bilirubin 1.1 0.3 - 1.2 mg/dL   GFR, Estimated >60 >60 mL/min   Anion gap 13 5 - 15  Lactic acid, plasma     Status: Abnormal   Collection Time: 11/29/20 11:02 PM  Result Value Ref Range   Lactic Acid, Venous 3.2 (HH) 0.5 - 1.9 mmol/L  Lactic acid, plasma     Status: Abnormal   Collection Time: 11/30/20  1:02 AM  Result Value Ref Range   Lactic Acid, Venous 4.1 (HH) 0.5 - 1.9 mmol/L  Urinalysis, Routine w reflex microscopic Urine, Clean Catch     Status: Abnormal   Collection Time: 11/30/20  2:00 AM  Result Value Ref Range   Color, Urine YELLOW YELLOW   APPearance HAZY (A) CLEAR   Specific Gravity, Urine 1.034 (H) 1.005 - 1.030   pH 5.0 5.0 - 8.0   Glucose, UA NEGATIVE NEGATIVE mg/dL   Hgb urine dipstick NEGATIVE NEGATIVE   Bilirubin Urine NEGATIVE NEGATIVE   Ketones, ur NEGATIVE NEGATIVE mg/dL   Protein, ur NEGATIVE NEGATIVE mg/dL   Nitrite NEGATIVE NEGATIVE   Leukocytes,Ua NEGATIVE NEGATIVE  Resp Panel by RT-PCR (Flu A&B, Covid) Nasopharyngeal Swab     Status: None   Collection Time: 11/30/20  2:53 AM   Specimen: Nasopharyngeal Swab; Nasopharyngeal(NP) swabs in vial transport medium  Result Value Ref Range   SARS Coronavirus 2 by RT PCR NEGATIVE NEGATIVE    Influenza A by PCR NEGATIVE NEGATIVE   Influenza B by PCR NEGATIVE NEGATIVE   Recent Results (from the past 240 hour(s))  Resp Panel by RT-PCR (Flu A&B, Covid) Nasopharyngeal Swab     Status: None   Collection Time:  11/30/20  2:53 AM   Specimen: Nasopharyngeal Swab; Nasopharyngeal(NP) swabs in vial transport medium  Result Value Ref Range Status   SARS Coronavirus 2 by RT PCR NEGATIVE NEGATIVE Final    Comment: (NOTE) SARS-CoV-2 target nucleic acids are NOT DETECTED.  The SARS-CoV-2 RNA is generally detectable in upper respiratory specimens during the acute phase of infection. The lowest concentration of SARS-CoV-2 viral copies this assay can detect is 138 copies/mL. A negative result does not preclude SARS-Cov-2 infection and should not be used as the sole basis for treatment or other patient management decisions. A negative result may occur with  improper specimen collection/handling, submission of specimen other than nasopharyngeal swab, presence of viral mutation(s) within the areas targeted by this assay, and inadequate number of viral copies(<138 copies/mL). A negative result must be combined with clinical observations, patient history, and epidemiological information. The expected result is Negative.  Fact Sheet for Patients:  EntrepreneurPulse.com.au  Fact Sheet for Healthcare Providers:  IncredibleEmployment.be  This test is no t yet approved or cleared by the [place] FDA and  has been authorized for detection and/or diagnosis of SARS-CoV-2 by FDA under an Emergency Use Authorization (EUA). This EUA will remain  in effect (meaning this test can be used) for the duration of the COVID-19 declaration under Section 564(b)(1) of the Act, 21 U.S.C.section 360bbb-3(b)(1), unless the authorization is terminated  or revoked sooner.       Influenza A by PCR NEGATIVE NEGATIVE Final   Influenza B by PCR NEGATIVE NEGATIVE Final     Comment: (NOTE) The Xpert Xpress SARS-CoV-2/FLU/RSV plus assay is intended as an aid in the diagnosis of influenza from Nasopharyngeal swab specimens and should not be used as a sole basis for treatment. Nasal washings and aspirates are unacceptable for Xpert Xpress SARS-CoV-2/FLU/RSV testing.  Fact Sheet for Patients: EntrepreneurPulse.com.au  Fact Sheet for Healthcare Providers: IncredibleEmployment.be  This test is not yet approved or cleared by the [place] FDA and has been authorized for detection and/or diagnosis of SARS-CoV-2 by FDA under an Emergency Use Authorization (EUA). This EUA will remain in effect (meaning this test can be used) for the duration of the COVID-19 declaration under Section 564(b)(1) of the Act, 21 U.S.C. section 360bbb-3(b)(1), unless the authorization is terminated or revoked.  Performed at [facility], [place] [address], [place], [place] [address]     Renal Function: Recent Labs    11/29/20 2302  CREATININE 1.34*   CrCl cannot be calculated (Unknown ideal weight.).  Radiologic Imaging: CT ABDOMEN PELVIS W CONTRAST  Result Date: 11/30/2020 CLINICAL DATA:  Lower abdominal pain.  Recent perianal abscess. EXAM: CT ABDOMEN AND PELVIS WITH CONTRAST TECHNIQUE: Multidetector CT imaging of the abdomen and pelvis was performed using the standard protocol following bolus administration of intravenous contrast. CONTRAST:  145mL OMNIPAQUE IOHEXOL 300 MG/ML  SOLN COMPARISON:  Abdominopelvic CT 11/03/2020 FINDINGS: Lower chest: No acute airspace disease or pleural effusion. Heart is normal in size. Hepatobiliary: Borderline hepatic steatosis. No focal liver lesion. Gallbladder physiologically distended, no calcified stone. No biliary dilatation. Pancreas: No ductal dilatation or inflammation. Mild motion artifact through the pancreas. Spleen: Normal in size without focal abnormality. Adrenals/Urinary Tract:  Minimal left adrenal thickening. No dominant adrenal nodule. No hydronephrosis or perinephric edema. Homogeneous renal enhancement with symmetric excretion on delayed phase imaging. Small cyst in the medial right kidney is unchanged. Urinary bladder is physiologically distended without wall thickening. Stomach/Bowel: Inflammation and soft tissue air, no drainable collection mild rectal wall  thickening. No additional sites of colonic wall thickening. Moderate colonic stool burden. Appendix not definitively visualized, no evidence of appendicitis. Decompressed small bowel without inflammation. Unremarkable stomach. Vascular/Lymphatic: Moderate aortic atherosclerosis with calcified noncalcified atheromatous plaque. Patent portal vein. Patent splenic veins. There is an irregular presacral nodule measuring 2.2 cm, series 2, image 74, this may represent an enlarged lymph node but is nonspecific. There prominent bilateral inguinal lymph nodes. Reproductive: Peroneal air previously localized to the right buttock and peritoneal and has spread anteriorly into the base of the penis and both scrotum. Patchy air seen within both scrotal sacs, right greater than left. Minimal air tracks adjacent to the left aspect of the prostate. Other: Air in soft tissue inflammation about the right buttock has progressed, with air-fluid collection in the right perianal region. Patchy soft tissue gas extends anteriorly into the perineum into both scrotum, base of the penis, and anterior abdominal wall. Few foci of soft tissue gas track adjacent to the prostate gland. No frank pneumoperitoneum or ascites. Musculoskeletal: There are no acute or suspicious osseous abnormalities. Occasional bone islands in the pelvis. IMPRESSION: 1. Worsening perineal inflammation, including right peri-rectal. Significant progression of soft tissue gas, now extending anteriorly into the perineum, base of the penis, both scrotal sacs and anterior abdominal wall.  Findings are highly suspicious for necrotizing soft tissue infection, and urgent surgical consult is recommended. 2. Irregular presacral nodule measuring 2.2 cm may represent an enlarged lymph node but is nonspecific. Recommend follow-up after course of treatment. There is rectal wall thickening which may be inflammatory, however neoplasm is not excluded. Aortic Atherosclerosis (ICD10-I70.0). These results were called by telephone at the time of interpretation on 11/30/2020 at 2:01 am to provider Lattie Haw, who will convery the results urgently to [facility] , who verbally acknowledged these results. Electronically Signed   By: Keith Rake M.D.   On: 11/30/2020 02:02    I independently reviewed the above imaging studies.  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  Discussed with patient that he may require extensive debridement of scrotal and penile skin pending evaluation intraoperatively. I discussed the potential benefits and risks of the procedure, side effects of the proposed treatment, the likelihood of the patient achieving the goals of the procedure, and any potential problems that might occur during the procedure or recuperation.  Informed consent was obtained.   Dutch Gray 11/30/2020, 4:03 AM    Pryor Curia MD  CC: Dr. Dolly Rias

## 2020-11-30 NOTE — ED Notes (Signed)
Pt changed into new gown. Personal shirt placed in belongings bag

## 2020-11-30 NOTE — ED Notes (Signed)
ED TO INPATIENT HANDOFF REPORT  Name/Age/Gender [name] 50 y.o. male  Code Status Code Status History    Date Active Date Inactive Code Status Order ID Comments User Context   11/03/2020 2140 11/05/2020 1932 Full Code [id number]  James Baker, MD Inpatient    Questions for Most Recent Historical Code Status (Order [id number])       Home/SNF/Other Home  Chief Complaint Perirectal abscess [K61.1]  Level of Care/Admitting Diagnosis ED Disposition    ED Disposition  Admit   Condition  --   [place]: [place] [[id number]]  Level of Care: Stepdown [14]  Admit to SDU based on following criteria: Severe physiological/psychological symptoms:  Any diagnosis requiring assessment & intervention at least every 4 hours on an ongoing basis to obtain desired patient outcomes including stability and rehabilitation  May admit patient to James Coffey or James Coffey if equivalent level of care is available:: Yes  Covid Evaluation: Symptomatic Person Under Investigation (PUI)  Diagnosis: Perirectal abscess [160737]  Admitting Physician: [place], [place]  Attending Physician: Michael Boston [[id number]]  Estimated length of stay: 3 - 4 days  Certification:: I certify this patient will need inpatient services for at least 2 midnights  Bed request comments: step down bed         Medical History History reviewed. No pertinent past medical history.  Allergies No Known Allergies  IV Location/Drains/Wounds Patient Lines/Drains/Airways Status    Active Line/Drains/Airways    Name Placement date Placement time Site Days   Peripheral IV 11/29/20 20 G Right Antecubital 11/29/20  2336  Antecubital  1   Peripheral IV 11/30/20 24 G 0.56" Left Hand 11/30/20  0234  Hand  less than 1   Wound / Incision (Open or Dehisced) 11/03/20  Buttocks Right 11/03/20  1200  Buttocks  27          Labs/Imaging Results for orders placed or performed during the hospital  encounter of 11/29/20 (from the past 48 hour(s))  CBC with Differential     Status: Abnormal   Collection Time: 11/29/20 11:02 PM  Result Value Ref Range   WBC 30.9 (H) 4.0 - 10.5 K/uL   RBC 4.42 4.22 - 5.81 MIL/uL   Hemoglobin 12.8 (L) 13.0 - 17.0 g/dL   HCT 38.8 (L) 39.0 - 52.0 %   MCV 87.8 80.0 - 100.0 fL   MCH 29.0 26.0 - 34.0 pg   MCHC 33.0 30.0 - 36.0 g/dL   RDW 13.5 11.5 - 15.5 %   Platelets 348 150 - 400 K/uL   nRBC 0.0 0.0 - 0.2 %   Neutrophils Relative % 72 %   Band Neutrophils 18 %   Lymphocytes Relative 5 %   Monocytes Relative 5 %   Eosinophils Relative 0 %   Basophils Relative 0 %   WBC Morphology NORMAL    RBC Morphology NORMAL    Smear Review DONE    nRBC NONE SEEN 0 /100 WBC   Metamyelocytes Relative NONE SEEN %   Myelocytes NONE SEEN %   Promyelocytes Relative NONE SEEN %   Blasts NONE SEEN %    Comment: Performed at [facility], [place] [address] [place], [place] [address]  Comprehensive metabolic panel     Status: Abnormal   Collection Time: 11/29/20 11:02 PM  Result Value Ref Range   Sodium 130 (L) 135 - 145 mmol/L   Potassium 4.0 3.5 - 5.1 mmol/L   Chloride 94 (L) 98 -  111 mmol/L   CO2 23 22 - 32 mmol/L   Glucose, Bld 166 (H) 70 - 99 mg/dL    Comment: Glucose reference range applies only to samples taken after fasting for at least 8 hours.   BUN 15 6 - 20 mg/dL   Creatinine, Ser 1.34 (H) 0.61 - 1.24 mg/dL   Calcium 9.2 8.9 - 10.3 mg/dL   Total Protein 8.0 6.5 - 8.1 g/dL   Albumin 3.2 (L) 3.5 - 5.0 g/dL   AST 20 15 - 41 U/L   ALT 23 0 - 44 U/L   Alkaline Phosphatase 116 38 - 126 U/L   Total Bilirubin 1.1 0.3 - 1.2 mg/dL   GFR, Estimated >60 >60 mL/min    Comment: (NOTE) Calculated using the CKD-EPI Creatinine Equation (2021)    Anion gap 13 5 - 15    Comment: Performed at [facility], [place] [address] [place], [place] [address]  Lactic acid, plasma     Status: Abnormal   Collection Time: 11/29/20 11:02 PM   Result Value Ref Range   Lactic Acid, Venous 3.2 (HH) 0.5 - 1.9 mmol/L    Comment: CRITICAL RESULT CALLED TO, READ BACK BY AND VERIFIED WITH: Mazikeen Hehn AT 0105 ON 11/30/20 BY Buel Ream Performed at [facility], [place] [address] [place], [place] [address]   Urinalysis, Routine w reflex microscopic Urine, Clean Catch     Status: Abnormal   Collection Time: 11/30/20  2:00 AM  Result Value Ref Range   Color, Urine YELLOW YELLOW   APPearance HAZY (A) CLEAR   Specific Gravity, Urine 1.034 (H) 1.005 - 1.030   pH 5.0 5.0 - 8.0   Glucose, UA NEGATIVE NEGATIVE mg/dL   Hgb urine dipstick NEGATIVE NEGATIVE   Bilirubin Urine NEGATIVE NEGATIVE   Ketones, ur NEGATIVE NEGATIVE mg/dL   Protein, ur NEGATIVE NEGATIVE mg/dL   Nitrite NEGATIVE NEGATIVE   Leukocytes,Ua NEGATIVE NEGATIVE    Comment: Performed at [place] [address] [place], [place] [address]   CT ABDOMEN PELVIS W CONTRAST  Result Date: 11/30/2020 CLINICAL DATA:  Lower abdominal pain.  Recent perianal abscess. EXAM: CT ABDOMEN AND PELVIS WITH CONTRAST TECHNIQUE: Multidetector CT imaging of the abdomen and pelvis was performed using the standard protocol following bolus administration of intravenous contrast. CONTRAST:  115mL OMNIPAQUE IOHEXOL 300 MG/ML  SOLN COMPARISON:  Abdominopelvic CT 11/03/2020 FINDINGS: Lower chest: No acute airspace disease or pleural effusion. Heart is normal in size. Hepatobiliary: Borderline hepatic steatosis. No focal liver lesion. Gallbladder physiologically distended, no calcified stone. No biliary dilatation. Pancreas: No ductal dilatation or inflammation. Mild motion artifact through the pancreas. Spleen: Normal in size without focal abnormality. Adrenals/Urinary Tract: Minimal left adrenal thickening. No dominant adrenal nodule. No hydronephrosis or perinephric edema. Homogeneous renal enhancement with symmetric excretion on delayed phase imaging. Small cyst in the  medial right kidney is unchanged. Urinary bladder is physiologically distended without wall thickening. Stomach/Bowel: Inflammation and soft tissue air, no drainable collection mild rectal wall thickening. No additional sites of colonic wall thickening. Moderate colonic stool burden. Appendix not definitively visualized, no evidence of appendicitis. Decompressed small bowel without inflammation. Unremarkable stomach. Vascular/Lymphatic: Moderate aortic atherosclerosis with calcified noncalcified atheromatous plaque. Patent portal vein. Patent splenic veins. There is an irregular presacral nodule measuring 2.2 cm, series 2, image 74, this may represent an enlarged lymph node but is nonspecific. There prominent bilateral inguinal lymph nodes. Reproductive: Peroneal air previously localized to the right buttock and  peritoneal and has spread anteriorly into the base of the penis and both scrotum. Patchy air seen within both scrotal sacs, right greater than left. Minimal air tracks adjacent to the left aspect of the prostate. Other: Air in soft tissue inflammation about the right buttock has progressed, with air-fluid collection in the right perianal region. Patchy soft tissue gas extends anteriorly into the perineum into both scrotum, base of the penis, and anterior abdominal wall. Few foci of soft tissue gas track adjacent to the prostate gland. No frank pneumoperitoneum or ascites. Musculoskeletal: There are no acute or suspicious osseous abnormalities. Occasional bone islands in the pelvis. IMPRESSION: 1. Worsening perineal inflammation, including right peri-rectal. Significant progression of soft tissue gas, now extending anteriorly into the perineum, base of the penis, both scrotal sacs and anterior abdominal wall. Findings are highly suspicious for necrotizing soft tissue infection, and urgent surgical consult is recommended. 2. Irregular presacral nodule measuring 2.2 cm may represent an enlarged lymph node but  is nonspecific. Recommend follow-up after course of treatment. There is rectal wall thickening which may be inflammatory, however neoplasm is not excluded. Aortic Atherosclerosis (ICD10-I70.0). These results were called by telephone at the time of interpretation on 11/30/2020 at 2:01 am to provider Lattie Haw, who will convery the results urgently to [facility] , who verbally acknowledged these results. Electronically Signed   By: Keith Rake M.D.   On: 11/30/2020 02:02    Pending Labs Unresulted Labs (From admission, onward)    Start     Ordered   11/30/20 0253  Resp Panel by RT-PCR (Flu A&B, Covid) Nasopharyngeal Swab  (Tier 2 - Symptomatic/asymptomatic with Precautions )  Once,   STAT       Question Answer Comment  Is this test for diagnosis or screening Screening   Symptomatic for COVID-19 as defined by CDC No   Hospitalized for COVID-19 No   Admitted to ICU for COVID-19 No   Previously tested for COVID-19 Yes   Resident in a congregate (group) care setting No   Employed in healthcare setting No   Has patient completed COVID vaccination(s) (2 doses of Pfizer/Moderna 1 dose of [employer]) Unknown      11/30/20 0253   11/29/20 2342  Blood culture (routine x 2)  BLOOD CULTURE X 2,   STAT      11/29/20 2341   11/29/20 2342  Urine culture  ONCE - STAT,   STAT        11/29/20 2341   11/29/20 2302  Lactic acid, plasma  Now then every 2 hours,   STAT      11/29/20 2301          Vitals/Pain Today's Vitals   11/30/20 0100 11/30/20 0130 11/30/20 0141 11/30/20 0200  BP: 97/70  122/71   Pulse: (!) 101  (!) 119   Resp: 16  (!) 22   Temp:  98.2 F (36.8 C)  99.3 F (37.4 C)  TempSrc:  Oral  Rectal  SpO2: 98%  96%   PainSc:        Isolation Precautions Airborne and Contact precautions  Medications Medications  piperacillin-tazobactam (ZOSYN) IVPB 3.375 g (has no administration in time range)  fentaNYL (SUBLIMAZE) injection 50 mcg (50 mcg Intravenous Given 11/29/20 2340)   ceFEPIme (MAXIPIME) 2 g in sodium chloride 0.9 % 100 mL IVPB (0 g Intravenous Stopped 11/30/20 0033)  vancomycin (VANCOREADY) IVPB 1500 mg/300 mL (0 mg Intravenous Stopped 11/30/20 0300)  iohexol (OMNIPAQUE) 300 MG/ML solution 100  mL (100 mLs Intravenous Contrast Given 11/30/20 0117)  sodium chloride 0.9 % bolus 1,000 mL (0 mLs Intravenous Stopped 11/30/20 0230)  lactated ringers bolus 1,000 mL (1,000 mLs Intravenous Bolus from Bag 11/30/20 0301)    Mobility walks with person assist

## 2020-11-30 NOTE — ED Provider Notes (Signed)
[facility] DEPT Provider Note   CSN: [id number] Arrival date & time: 11/29/20  2202     History Chief Complaint  Patient presents with   Abscess   Abdominal Pain    James Coffey is a 50 y.o. male.  50 year old male with medical problems documented below who presents emerged from today with perineal pain.  Patient had been admitted to the hospital for a perirectal/perianal abscess back in May 29.  He was here for couple days for IV antibiotics.  He is being comanaged by medicine and surgery.  Patient states that his symptoms had improved but then over the last 24 to 48 hours he had significant worsening of erythema and pain in the perineal area that goes all the way up to his lower abdomen.  He presents here for further evaluation.  He has had chills but no fevers.  He still has drainage from the wound.  He has also has pain in his penis.  Normal bowel movements otherwise.   Abscess Abdominal Pain     History reviewed. No pertinent past medical history.  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   [place] speaking patient - Prefers [place]ian or [place] 11/30/2020   Fourniers gangrene 11/30/2020   Perianal abscess    Perirectal abscess 11/03/2020   Cellulitis 11/03/2020   Proctitis 11/03/2020   Sepsis ([place]) 11/03/2020   Tobacco abuse 11/03/2020    Past Surgical History:  Procedure Laterality Date   HERNIA REPAIR     INGUINAL HERNIA REPAIR Right 2007       Family History  Problem Relation Age of Onset   CAD Neg Hx    Inflammatory bowel disease Neg Hx     Social History   Tobacco Use   Smoking status: Every Day    Packs/day: 1.00    Pack years: 0.00    Types: Cigarettes   Smokeless tobacco: Never  Vaping Use   Vaping Use: Never used  Substance Use Topics   Alcohol use: No   Drug use: No    Home Medications Prior to Admission medications   Medication Sig Start Date  End Date Taking? Authorizing Provider  acetaminophen (TYLENOL) 325 MG tablet Take 2 tablets (650 mg total) by mouth every 6 (six) hours as needed for mild pain (or Fever >/= 101). 11/05/20   Domenic Polite, MD  ibuprofen (ADVIL) 200 MG tablet Take 200 mg by mouth every 6 (six) hours as needed for mild pain.    [provider]    Allergies    Patient has no known allergies.  Review of Systems   Review of Systems  Gastrointestinal:  Positive for abdominal pain.  All other systems reviewed and are negative.  Physical Exam Updated Vital Signs BP (!) 75/65   Pulse 98   Temp 98.9 F (37.2 C)   Resp (!) 30   SpO2 93%   Physical Exam Vitals and nursing note reviewed.  Constitutional:      Appearance: He is well-developed.  HENT:     Head: Normocephalic and atraumatic.     Nose: No congestion or rhinorrhea.     Mouth/Throat:     Mouth: Mucous membranes are moist.     Pharynx: Oropharynx is clear.  Eyes:     Pupils: Pupils are equal, round, and reactive to light.  Cardiovascular:     Rate and Rhythm: Normal rate.  Pulmonary:     Effort: Pulmonary  effort is normal. No respiratory distress.  Abdominal:     General: There is no distension.  Genitourinary:    Testes:        Right: Tenderness and swelling present.  Musculoskeletal:        General: Normal range of motion.     Cervical back: Normal range of motion.  Skin:    General: Skin is warm and dry.     Findings: Erythema present.     Comments: Patient has erythema, induration and edema surrounding the wound in the right perianal area.  This erythema and induration extends up to the right inner thigh, and the right suprapubic area.  He has edema of his scrotum and penis as well.  Malodorous.  Neurological:     General: No focal deficit present.     Mental Status: He is alert.    ED Results / Procedures / Treatments   Labs (all labs ordered are listed, but only abnormal results are displayed) Labs Reviewed  CBC  WITH DIFFERENTIAL/PLATELET - Abnormal; Notable for the following components:      Result Value   WBC 30.9 (*)    Hemoglobin 12.8 (*)    HCT 38.8 (*)    All other components within normal limits  COMPREHENSIVE METABOLIC PANEL - Abnormal; Notable for the following components:   Sodium 130 (*)    Chloride 94 (*)    Glucose, Bld 166 (*)    Creatinine, Ser 1.34 (*)    Albumin 3.2 (*)    All other components within normal limits  LACTIC ACID, PLASMA - Abnormal; Notable for the following components:   Lactic Acid, Venous 3.2 (*)    All other components within normal limits  LACTIC ACID, PLASMA - Abnormal; Notable for the following components:   Lactic Acid, Venous 4.1 (*)    All other components within normal limits  URINALYSIS, ROUTINE W REFLEX MICROSCOPIC - Abnormal; Notable for the following components:   APPearance HAZY (*)    Specific Gravity, Urine 1.034 (*)    All other components within normal limits  RESP PANEL BY RT-PCR (FLU A&B, COVID) ARPGX2  CULTURE, BLOOD (ROUTINE X 2)  CULTURE, BLOOD (ROUTINE X 2)  URINE CULTURE  AEROBIC/ANAEROBIC CULTURE W GRAM STAIN (SURGICAL/DEEP WOUND)  SURGICAL PATHOLOGY    EKG None  Radiology CT ABDOMEN PELVIS W CONTRAST  Result Date: 11/30/2020 CLINICAL DATA:  Lower abdominal pain.  Recent perianal abscess. EXAM: CT ABDOMEN AND PELVIS WITH CONTRAST TECHNIQUE: Multidetector CT imaging of the abdomen and pelvis was performed using the standard protocol following bolus administration of intravenous contrast. CONTRAST:  146mL OMNIPAQUE IOHEXOL 300 MG/ML  SOLN COMPARISON:  Abdominopelvic CT 11/03/2020 FINDINGS: Lower chest: No acute airspace disease or pleural effusion. Heart is normal in size. Hepatobiliary: Borderline hepatic steatosis. No focal liver lesion. Gallbladder physiologically distended, no calcified stone. No biliary dilatation. Pancreas: No ductal dilatation or inflammation. Mild motion artifact through the pancreas. Spleen: Normal in size  without focal abnormality. Adrenals/Urinary Tract: Minimal left adrenal thickening. No dominant adrenal nodule. No hydronephrosis or perinephric edema. Homogeneous renal enhancement with symmetric excretion on delayed phase imaging. Small cyst in the medial right kidney is unchanged. Urinary bladder is physiologically distended without wall thickening. Stomach/Bowel: Inflammation and soft tissue air, no drainable collection mild rectal wall thickening. No additional sites of colonic wall thickening. Moderate colonic stool burden. Appendix not definitively visualized, no evidence of appendicitis. Decompressed small bowel without inflammation. Unremarkable stomach. Vascular/Lymphatic: Moderate aortic atherosclerosis with calcified noncalcified atheromatous  plaque. Patent portal vein. Patent splenic veins. There is an irregular presacral nodule measuring 2.2 cm, series 2, image 74, this may represent an enlarged lymph node but is nonspecific. There prominent bilateral inguinal lymph nodes. Reproductive: Peroneal air previously localized to the right buttock and peritoneal and has spread anteriorly into the base of the penis and both scrotum. Patchy air seen within both scrotal sacs, right greater than left. Minimal air tracks adjacent to the left aspect of the prostate. Other: Air in soft tissue inflammation about the right buttock has progressed, with air-fluid collection in the right perianal region. Patchy soft tissue gas extends anteriorly into the perineum into both scrotum, base of the penis, and anterior abdominal wall. Few foci of soft tissue gas track adjacent to the prostate gland. No frank pneumoperitoneum or ascites. Musculoskeletal: There are no acute or suspicious osseous abnormalities. Occasional bone islands in the pelvis. IMPRESSION: 1. Worsening perineal inflammation, including right peri-rectal. Significant progression of soft tissue gas, now extending anteriorly into the perineum, base of the penis,  both scrotal sacs and anterior abdominal wall. Findings are highly suspicious for necrotizing soft tissue infection, and urgent surgical consult is recommended. 2. Irregular presacral nodule measuring 2.2 cm may represent an enlarged lymph node but is nonspecific. Recommend follow-up after course of treatment. There is rectal wall thickening which may be inflammatory, however neoplasm is not excluded. Aortic Atherosclerosis (ICD10-I70.0). These results were called by telephone at the time of interpretation on 11/30/2020 at 2:01 am to provider Lattie Haw, who will convery the results urgently to [facility] , who verbally acknowledged these results. Electronically Signed   By: Keith Rake M.D.   On: 11/30/2020 02:02    Procedures .Critical Care  Date/Time: 11/30/2020 6:27 AM Performed by: Merrily Pew, MD Authorized by: Merrily Pew, MD   Critical care provider statement:    Critical care time (minutes):  45   Critical care was necessary to treat or prevent imminent or life-threatening deterioration of the following conditions:  Sepsis   Critical care was time spent personally by me on the following activities:  Discussions with consultants, evaluation of patient's response to treatment, examination of patient, ordering and performing treatments and interventions, ordering and review of laboratory studies, ordering and review of radiographic studies, pulse oximetry, re-evaluation of patient's condition, obtaining history from patient or surrogate and review of old charts   Medications Ordered in ED Medications  piperacillin-tazobactam (ZOSYN) IVPB 3.375 g ( Intravenous Automatically Held 12/08/20 2200)  lactated ringers infusion (has no administration in time range)  acetaminophen (TYLENOL) tablet 325-650 mg (has no administration in time range)    Or  acetaminophen (TYLENOL) 160 MG/5ML solution 325-650 mg (has no administration in time range)  HYDROmorphone (DILAUDID) injection 0.25-0.5 mg (has no  administration in time range)  acetaminophen (OFIRMEV) IV 1,000 mg (has no administration in time range)  meperidine (DEMEROL) injection 6.25-12.5 mg (has no administration in time range)  promethazine (PHENERGAN) injection 6.25-12.5 mg (has no administration in time range)  amisulpride (BARHEMSYS) injection 10 mg (has no administration in time range)  0.9 % irrigation (POUR BTL) (1,000 mLs Irrigation Given 11/30/20 0440)  albumin human 5 % solution 12.5 g (has no administration in time range)  vancomycin (VANCOREADY) IVPB 1500 mg/300 mL (has no administration in time range)  PHENYLephrine 40 mcg/ml in normal saline Adult IV Push Syringe (For Blood Pressure Support) (has no administration in time range)  fentaNYL (SUBLIMAZE) injection 50 mcg (50 mcg Intravenous Given 11/29/20 2340)  ceFEPIme (  MAXIPIME) 2 g in sodium chloride 0.9 % 100 mL IVPB (0 g Intravenous Stopped 11/30/20 0033)  vancomycin (VANCOREADY) IVPB 1500 mg/300 mL (0 mg Intravenous Stopped 11/30/20 0300)  iohexol (OMNIPAQUE) 300 MG/ML solution 100 mL (100 mLs Intravenous Contrast Given 11/30/20 0117)  sodium chloride 0.9 % bolus 1,000 mL (0 mLs Intravenous Stopped 11/30/20 0230)  lactated ringers bolus 1,000 mL (0 mLs Intravenous Stopped 11/30/20 0335)  piperacillin-tazobactam (ZOSYN) 3.375 GM/50ML IVPB (  Override pull for Anesthesia 11/30/20 0431)  albumin human 5 % solution (0 g  Stopped 11/30/20 0610)  phenylephrine (NEOSYNEPHRINE) 10-0.9 MG/250ML-% infusion (50 mcg/min Intravenous New Bag/Given 11/30/20 0737)    ED Course  I have reviewed the triage vital signs and the nursing notes.  Pertinent labs & imaging results that were available during my care of the patient were reviewed by me and considered in my medical decision making (see chart for details).    MDM Rules/Calculators/A&P                          Patient initially treated for sepsis associated with cellulitis.  Was given Vanco and cefepime.  However once I got his  lactic acid of 3.2 and a CT scan as documented above a page was made to Dr. Johney Maine.  The mycin was added onto the other broad-spectrum antibiotics to double cover for anaerobes.   Physician assistant, Lattie Haw discussed with the OR nurse at [facility] and Dr. Johney Maine said that he would come see the patient.  I consulted urology as well who looked at the CT scan and said that he would also be involved with the patient's care and would coordinate with general surgery team.  Shortly after that I spoke with Dr. Johney Maine with general surgery who stated that he would also be taken to the OR for concern of Fournier's gangrene/necrotizing fasciitis.  Final Clinical Impression(s) / ED Diagnoses Final diagnoses:  Cellulitis of perineum    Rx / DC Orders ED Discharge Orders     None        Chaya Dehaan, Corene Cornea, MD 11/30/20 0630

## 2020-11-30 NOTE — Progress Notes (Signed)
Pharmacy Antibiotic Note  James Coffey is a 50 y.o. male admitted on 11/29/2020 with  Fournier's gangrene .  Pharmacy has been consulted for Vancomycin dosing.  Scr elevated to 1.34 from baseline, but now improved to 0.7.   S/P I&D today Remains hypotensive on pressors postop. Lactic acid: 3.2 > 4.1 > 2.7 WBC 30  Plan: Zosyn 3.375gm IV Q8h to be infused over 4hrs  Vancomycin 1250 mg IV q12h (Expected AUC 459, SCr rounded 0.8, TBW < IBW) Monitor renal function and cx data  Daily Scr while on Vancomycin/Zosyn combination  Weight: 75.1 kg (165 lb 9.1 oz)  Temp (24hrs), Avg:98.6 F (37 C), Min:98.1 F (36.7 C), Max:99.3 F (37.4 C)  Recent Labs  Lab 11/29/20 2302 11/30/20 0102 11/30/20 1200 11/30/20 1237  WBC 30.9*  --   --   --   CREATININE 1.34*  --   --  0.70  LATICACIDVEN 3.2* 4.1* 2.7*  --      Estimated Creatinine Clearance: 116 mL/min (by C-G formula based on SCr of 0.7 mg/dL).    No Known Allergies  Antimicrobials this admission: 6/25 Cefepime x1 in ED 6/25 Zosyn >>  6/25 Vancomycin >>  Dose adjustments this admission:   Microbiology results: 6/24 BCx:  6/25 UCx:    Thank you for allowing pharmacy to be a part of this patient's care.  Gretta Arab PharmD, BCPS Clinical Pharmacist [facility] main pharmacy [phone] 11/30/2020 2:08 PM

## 2020-11-30 NOTE — Procedures (Signed)
Central Venous Catheter Insertion Procedure Note  James Coffey  [id number]  April 02, 1971  Date:11/30/20  Time:8:23 AM   Provider Performing:Amandalee Lacap A Deverick Pruss   Procedure: Insertion of Non-tunneled Central Venous [phone]) with US guidance ([id number])   Indication(s) Medication administration  Consent Risks of the procedure as well as the alternatives and risks of each were explained to the patient and/or caregiver.  Consent for the procedure was obtained and is signed in the bedside chart  Anesthesia Topical only with 1% lidocaine   Timeout Verified patient identification, verified procedure, site/side was marked, verified correct patient position, special equipment/implants available, medications/allergies/relevant history reviewed, required imaging and test results available.  Sterile Technique Maximal sterile technique including full sterile barrier drape, hand hygiene, sterile gown, sterile gloves, mask, hair covering, sterile ultrasound probe cover (if used).  Procedure Description Area of catheter insertion was cleaned with chlorhexidine and draped in sterile fashion.  With real-time ultrasound guidance a central venous catheter was placed into the right internal jugular vein. Nonpulsatile blood flow and easy flushing noted in all ports.  The catheter was sutured in place and sterile dressing applied.  Complications/Tolerance None; patient tolerated the procedure well. Chest X-ray is ordered to verify placement for internal jugular or subclavian cannulation.   Chest x-ray is not ordered for femoral cannulation.  EBL Minimal  Specimen(s) None

## 2020-11-30 NOTE — Progress Notes (Signed)
Pharmacy Antibiotic Note  James Coffey is a 50 y.o. male admitted on 11/29/2020 with  Fournier's gangrene .  Pharmacy has been consulted for Vancomycin dosing.  Scr elevated from baseline S/P I&D today  Plan: Zosyn 3.375gm IV Q8h to be infused over 4hrs  Vancomycin 1500mg  IV q24h to target AUC 400-550 Monitor renal function and cx data  Daily Scr while on Vancomycin/Zosyn combination     Temp (24hrs), Avg:98.6 F (37 C), Min:98.1 F (36.7 C), Max:99.3 F (37.4 C)  Recent Labs  Lab 11/29/20 2302 11/30/20 0102  WBC 30.9*  --   CREATININE 1.34*  --   LATICACIDVEN 3.2* 4.1*    CrCl cannot be calculated (Unknown ideal weight.).    No Known Allergies  Antimicrobials this admission: 6/25 Cefepime x1 in ED 6/25 Zosyn >>  6/25 Vancomycin >>  Dose adjustments this admission:   Microbiology results: 6/24 BCx:  6/25 UCx:    Thank you for allowing pharmacy to be a part of this patient's care.  Netta Cedars PharmD 11/30/2020 5:51 AM

## 2020-11-30 NOTE — Progress Notes (Signed)
NAMEArias Coffey, MRN:  [id number], DOB:  [birth year], LOS: 0 ADMISSION DATE:  11/29/2020, CONSULTATION DATE:  11/30/20 REFERRING MD:  Dr Georgette Dover, CHIEF COMPLAINT: Septic shock secondary to Fournier's gangrene  History of Present Illness:   James Coffey was admitted with Fournier's gangrene Post I&D and debridement Following surgery has remained hypotensive, on dual BP meds Consult was called to assist with management of septic shock  He was treated about a month ago for a perirectal abscess-improved with antibiotics Started having pain and discomfort, urinary frequency, frequent bowel motions Came in with very tender scrotum and CT revealed gas at the base of the scrotum and base of penis Tachycardic, hypotensive Smoker  Pertinent  Medical History  History reviewed. No pertinent past medical history.   Significant Hospital Events: Including procedures, antibiotic start and stop dates in addition to other pertinent events   I&D and debridement 6/25 Central line placement 6/25  Cefepime 6/24 Zosyn 6/24 Vancomycin 6/24  Interim History / Subjective:  Awake and alert, interactive, has some pain  Objective   Blood pressure (!) 74/36, pulse 95, temperature 98.2 F (36.8 C), temperature source Oral, resp. rate (!) 26, SpO2 94 %.        Intake/Output Summary (Last 24 hours) at 11/30/2020 0827 Last data filed at 11/30/2020 0645 Gross per 24 hour  Intake 2700 ml  Output 575 ml  Net 2125 ml   There were no vitals filed for this visit.  Examination: General: Middle-age gentleman, does not appear to be in extremis, tachycardic, tachypneic HENT: Moist oral mucosa Lungs: Clear breath sounds bilaterally Cardiovascular: S1-S2 appreciated Abdomen: Bowel sounds appreciated, postsurgical changes in perineum, dressings Extremities: No clubbing, no edema Neuro: Alert and oriented x3 GU: Good urine output  Labs/imaging that I have personally reviewed  (right click and "Reselect all  SmartList Selections" daily)  Blood work reviewed significant for leukocytosis  Resolved Hospital Problem list     Assessment & Plan:  Septic shock secondary to Fournier's gangrene -On Neo-Synephrine and Levophed peripherally, this will be changed -Continue to fluid resuscitate -On Zosyn, vancomycin.  Did receive dose of cefepime -Blood culture drawn-results pending  Fournier's gangrene -Post I&D and debridement 6/25 -Surgery will continue to follow  Acute kidney injury secondary to sepsis -Avoid nephrotoxic's -Trend electrolytes -Maintain renal perfusion  Hypotension -Related to septic shock  Active smoker -Smoking cessation counseling when appropriate  Best Practice (right click and "Reselect all SmartList Selections" daily)   Diet/type: Regular consistency (see orders) Pain/Anxiety/Delirium protocol Yes VAP protocol (if indicated): Not indicated DVT prophylaxis: prophylactic heparin  GI prophylaxis: N/A Glucose control:  not indicated Central venous access:  Yes, and it is still needed Arterial line:  N/A Foley:  Yes, and it is still needed Mobility:  bed rest  PT consulted: N/A Studies pending: None Culture data pending:blood Last reviewed culture data:today Antibiotics:zosyn and vanc Antibiotic de-escalation: no,  continue current rx Stop date: to be determined  Code Status:  full code Last date of multidisciplinary goals of care discussion [pending] ccm prognosis: Life-threating and Serious Disposition: remains critically ill, will stay in intensive care   Labs   CBC: Recent Labs  Lab 11/29/20 2302  WBC 30.9*  HGB 12.8*  HCT 38.8*  MCV 87.8  PLT 629    Basic Metabolic Panel: Recent Labs  Lab 11/29/20 2302  NA 130*  K 4.0  CL 94*  CO2 23  GLUCOSE 166*  BUN 15  CREATININE 1.34*  CALCIUM 9.2   GFR: CrCl  cannot be calculated (Unknown ideal weight.). Recent Labs  Lab 11/29/20 2302 11/30/20 0102  WBC 30.9*  --   LATICACIDVEN 3.2*  4.1*    Liver Function Tests: Recent Labs  Lab 11/29/20 2302  AST 20  ALT 23  ALKPHOS 116  BILITOT 1.1  PROT 8.0  ALBUMIN 3.2*   No results for input(s): LIPASE, AMYLASE in the last 168 hours. No results for input(s): AMMONIA in the last 168 hours.  ABG No results found for: PHART, PCO2ART, PO2ART, HCO3, TCO2, ACIDBASEDEF, O2SAT   Coagulation Profile: No results for input(s): INR, PROTIME in the last 168 hours.  Cardiac Enzymes: No results for input(s): CKTOTAL, CKMB, CKMBINDEX, TROPONINI in the last 168 hours.  HbA1C: Hgb A1c MFr Bld  Date/Time Value Ref Range Status  11/04/2020 03:30 AM 5.6 4.8 - 5.6 % Final    Comment:    (NOTE)         Prediabetes: 5.7 - 6.4         Diabetes: >6.4         Glycemic control for adults with diabetes: <7.0     CBG: No results for input(s): GLUCAP in the last 168 hours.  Review of Systems:   Admits to pain and discomfort around surgical site  Past Medical History:  He,  has no past medical history on file.   Surgical History:   Past Surgical History:  Procedure Laterality Date   HERNIA REPAIR     INGUINAL HERNIA REPAIR Right 2007     Social History:   reports that he has been smoking cigarettes. He has been smoking an average of 1.00 packs per day. He has never used smokeless tobacco. He reports that he does not drink alcohol and does not use drugs.   Family History:  His family history is negative for CAD and Inflammatory bowel disease.   Allergies No Known Allergies   Home Medications  Prior to Admission medications   Medication Sig Start Date End Date Taking? Authorizing Provider  acetaminophen (TYLENOL) 325 MG tablet Take 2 tablets (650 mg total) by mouth every 6 (six) hours as needed for mild pain (or Fever >/= 101). 11/05/20   Domenic Polite, MD  ibuprofen (ADVIL) 200 MG tablet Take 200 mg by mouth every 6 (six) hours as needed for mild pain.    [provider]    The patient is critically ill with  multiple organ systems failure and requires high complexity decision making for assessment and support, frequent evaluation and titration of therapies, application of advanced monitoring technologies and extensive interpretation of multiple databases. Critical Care Time devoted to patient care services described in this note independent of APP/resident time (if applicable)  is 35 minutes.   Sherrilyn Rist MD [facility] Personal pager: [phone] If unanswered, please page CCM On-call: [phone]

## 2020-11-30 NOTE — ED Notes (Signed)
Pt transported to CT ?

## 2020-11-30 NOTE — Anesthesia Postprocedure Evaluation (Signed)
Anesthesia Post Note  Patient: Audiological scientist  Procedure(s) Performed: INCISION AND DEBRIDEMENT OF PERIRECTAL ABSCESS AND RECTAL WALL BIOPSIES (Scrotum)     Patient location during evaluation: PACU Anesthesia Type: General Level of consciousness: awake and alert Vital Signs Assessment: post-procedure vital signs reviewed and stable Respiratory status: spontaneous breathing, nonlabored ventilation, respiratory function stable and patient connected to nasal cannula oxygen Cardiovascular status: blood pressure returned to baseline and stable Postop Assessment: no apparent nausea or vomiting Anesthetic complications: no Comments: Requiring vasopressor support and volume resuscitation. Good mentation. Breathing tx administered in PACU. Recommend step down or ICU for close monitoring. Unable to adequately treat pain due to BP concerns.    No notable events documented.  Last Vitals:  Vitals:   11/30/20 0635 11/30/20 0640  BP: (!) 72/51 (!) 74/48  Pulse: 96 96  Resp: (!) 24 (!) 25  Temp:  37.1 C  SpO2: 94% 100%    Last Pain:  Vitals:   11/30/20 0615  TempSrc:   PainSc: [place]

## 2020-12-01 ENCOUNTER — Inpatient Hospital Stay (HOSPITAL_COMMUNITY): Payer: Self-pay | Admitting: Certified Registered"

## 2020-12-01 ENCOUNTER — Encounter (HOSPITAL_COMMUNITY): Payer: Self-pay | Admitting: Surgery

## 2020-12-01 ENCOUNTER — Encounter (HOSPITAL_COMMUNITY)
Admission: EM | Disposition: A | Discharge status: Home Health | Payer: Self-pay | Source: Home / Self Care | Attending: Family Medicine

## 2020-12-01 DIAGNOSIS — K624 Stenosis of anus and rectum: Secondary | ICD-10-CM

## 2020-12-01 HISTORY — PX: INCISION AND DRAINAGE ABSCESS: SHX5864

## 2020-12-01 LAB — CBC WITH DIFFERENTIAL/PLATELET
Abs Immature Granulocytes: 0.4 10*3/uL — ABNORMAL HIGH (ref 0.00–0.07)
Basophils Absolute: 0.1 10*3/uL (ref 0.0–0.1)
Basophils Relative: 0 %
Eosinophils Absolute: 0 10*3/uL (ref 0.0–0.5)
Eosinophils Relative: 0 %
HCT: 27.9 % — ABNORMAL LOW (ref 39.0–52.0)
Hemoglobin: 9.1 g/dL — ABNORMAL LOW (ref 13.0–17.0)
Immature Granulocytes: 2 %
Lymphocytes Relative: 6 %
Lymphs Abs: 1.2 10*3/uL (ref 0.7–4.0)
MCH: 29 pg (ref 26.0–34.0)
MCHC: 32.6 g/dL (ref 30.0–36.0)
MCV: 88.9 fL (ref 80.0–100.0)
Monocytes Absolute: 1.2 10*3/uL — ABNORMAL HIGH (ref 0.1–1.0)
Monocytes Relative: 6 %
Neutro Abs: 18.7 10*3/uL — ABNORMAL HIGH (ref 1.7–7.7)
Neutrophils Relative %: 86 %
Platelets: 284 10*3/uL (ref 150–400)
RBC: 3.14 MIL/uL — ABNORMAL LOW (ref 4.22–5.81)
RDW: 13.8 % (ref 11.5–15.5)
WBC: 21.6 10*3/uL — ABNORMAL HIGH (ref 4.0–10.5)
nRBC: 0 % (ref 0.0–0.2)

## 2020-12-01 LAB — BASIC METABOLIC PANEL
Anion gap: 7 (ref 5–15)
Anion gap: 7 (ref 5–15)
BUN: 9 mg/dL (ref 6–20)
BUN: 9 mg/dL (ref 6–20)
CO2: 22 mmol/L (ref 22–32)
CO2: 22 mmol/L (ref 22–32)
Calcium: 8.2 mg/dL — ABNORMAL LOW (ref 8.9–10.3)
Calcium: 8.1 mg/dL — ABNORMAL LOW (ref 8.9–10.3)
Chloride: 108 mmol/L (ref 98–111)
Chloride: 107 mmol/L (ref 98–111)
Creatinine, Ser: 0.61 mg/dL (ref 0.61–1.24)
Creatinine, Ser: 0.53 mg/dL — ABNORMAL LOW (ref 0.61–1.24)
GFR, Estimated: >60 mL/min (ref >60)
GFR, Estimated: >60 mL/min (ref >60)
Glucose, Bld: 182 mg/dL — ABNORMAL HIGH (ref 70–99)
Glucose, Bld: 177 mg/dL — ABNORMAL HIGH (ref 70–99)
Potassium: 3.6 mmol/L (ref 3.5–5.1)
Potassium: 3.5 mmol/L (ref 3.5–5.1)
Sodium: 137 mmol/L (ref 135–145)
Sodium: 136 mmol/L (ref 135–145)

## 2020-12-01 LAB — URINE CULTURE: Culture: NO GROWTH

## 2020-12-01 LAB — MAGNESIUM: Magnesium: 1.9 mg/dL (ref 1.7–2.4)

## 2020-12-01 LAB — PREALBUMIN: Prealbumin: <5 mg/dL — ABNORMAL LOW (ref 18–38)

## 2020-12-01 SURGERY — INCISION AND DRAINAGE, ABSCESS
Anesthesia: General | Site: Perineum

## 2020-12-01 MED ORDER — ALBUMIN HUMAN 5 % IV SOLN: INTRAVENOUS | Status: DC | PRN

## 2020-12-01 MED ORDER — GABAPENTIN 300 MG PO CAPS: 300.0000 mg | ORAL_CAPSULE | ORAL | Status: DC

## 2020-12-01 MED ORDER — ETOMIDATE 2 MG/ML IV SOLN
INTRAVENOUS | Status: DC | PRN
  Administered 2020-12-01: 10 mg via INTRAVENOUS

## 2020-12-01 MED ORDER — SIMETHICONE 80 MG PO CHEW
40.0000 mg | CHEWABLE_TABLET | Freq: Four times a day (QID) | ORAL | Status: DC | PRN
Start: 2020-12-01 — End: 2020-12-19
  Administered 2020-12-12: 40 mg via ORAL
  Filled 2020-12-01: qty 1

## 2020-12-01 MED ORDER — ROCURONIUM BROMIDE 10 MG/ML (PF) SYRINGE
PREFILLED_SYRINGE | INTRAVENOUS | Status: DC | PRN
  Administered 2020-12-01: 10 mg via INTRAVENOUS
  Administered 2020-12-01: 30 mg via INTRAVENOUS

## 2020-12-01 MED ORDER — LACTATED RINGERS IV SOLN: INTRAVENOUS | Status: DC | PRN

## 2020-12-01 MED ORDER — FENTANYL CITRATE (PF) 100 MCG/2ML IJ SOLN
INTRAMUSCULAR | Status: AC
  Filled 2020-12-01: qty 2

## 2020-12-01 MED ORDER — SUCCINYLCHOLINE 20MG/ML (10ML) SYRINGE FOR MEDFUSION PUMP - OPTIME
INTRAMUSCULAR | Status: DC | PRN
  Administered 2020-12-01: 100 mg via INTRAVENOUS

## 2020-12-01 MED ORDER — MIDAZOLAM HCL 2 MG/2ML IJ SOLN
INTRAMUSCULAR | Status: AC
  Filled 2020-12-01: qty 2

## 2020-12-01 MED ORDER — PROPOFOL 10 MG/ML IV BOLUS
INTRAVENOUS | Status: AC
  Filled 2020-12-01: qty 20

## 2020-12-01 MED ORDER — ONDANSETRON HCL 4 MG/2ML IJ SOLN
INTRAMUSCULAR | Status: AC
  Filled 2020-12-01: qty 2

## 2020-12-01 MED ORDER — ALBUMIN HUMAN 5 % IV SOLN
INTRAVENOUS | Status: AC
  Filled 2020-12-01: qty 500

## 2020-12-01 MED ORDER — LIDOCAINE 2% (20 MG/ML) 5 ML SYRINGE
INTRAMUSCULAR | Status: DC | PRN
  Administered 2020-12-01: 40 mg via INTRAVENOUS

## 2020-12-01 MED ORDER — ACETAMINOPHEN 500 MG PO TABS: 1000.0000 mg | ORAL_TABLET | ORAL | Status: DC

## 2020-12-01 MED ORDER — ONDANSETRON HCL 4 MG/2ML IJ SOLN
INTRAMUSCULAR | Status: DC | PRN
  Administered 2020-12-01: 4 mg via INTRAVENOUS

## 2020-12-01 MED ORDER — METOPROLOL TARTRATE 5 MG/5ML IV SOLN: 5.0000 mg | Freq: Four times a day (QID) | INTRAVENOUS | Status: DC | PRN

## 2020-12-01 MED ORDER — SUGAMMADEX SODIUM 200 MG/2ML IV SOLN
INTRAVENOUS | Status: DC | PRN
  Administered 2020-12-01: 200 mg via INTRAVENOUS

## 2020-12-01 MED ORDER — LIDOCAINE 2% (20 MG/ML) 5 ML SYRINGE
INTRAMUSCULAR | Status: AC
  Filled 2020-12-01: qty 5

## 2020-12-01 MED ORDER — 0.9 % SODIUM CHLORIDE (POUR BTL) OPTIME
TOPICAL | Status: DC | PRN
  Administered 2020-12-01: 1000 mL

## 2020-12-01 MED ORDER — CELECOXIB 200 MG PO CAPS
200.0000 mg | ORAL_CAPSULE | ORAL | Status: AC
  Filled 2020-12-01: qty 1

## 2020-12-01 SURGICAL SUPPLY — 37 items
BAG COUNTER SPONGE SURGICOUNT (BAG) IMPLANT
BLADE SURG 15 STRL LF DISP TIS (BLADE) ×1 IMPLANT
BLADE SURG 15 STRL SS (BLADE) ×2
BNDG CONFORM 2 STRL LF (GAUZE/BANDAGES/DRESSINGS) ×2 IMPLANT
BNDG CONFORM 3 STRL LF (GAUZE/BANDAGES/DRESSINGS) ×2 IMPLANT
BNDG GAUZE ELAST 4 BULKY (GAUZE/BANDAGES/DRESSINGS) ×4 IMPLANT
BRIEF STRETCH FOR OB PAD LRG (UNDERPADS AND DIAPERS) ×2 IMPLANT
COVER SURGICAL LIGHT HANDLE (MISCELLANEOUS) ×2 IMPLANT
DRAPE LAPAROTOMY T 102X78X121 (DRAPES) ×2 IMPLANT
DRSG PAD ABDOMINAL 8X10 ST (GAUZE/BANDAGES/DRESSINGS) ×2 IMPLANT
ELECT REM PT RETURN 15FT ADLT (MISCELLANEOUS) ×2 IMPLANT
GAUZE 4X4 16PLY ~~LOC~~+RFID DBL (SPONGE) ×2 IMPLANT
GAUZE PACKING 2X5 YD STRL (GAUZE/BANDAGES/DRESSINGS) ×2 IMPLANT
GAUZE SPONGE 4X4 12PLY STRL (GAUZE/BANDAGES/DRESSINGS) ×4 IMPLANT
GLOVE SURG LTX SZ8 (GLOVE) ×2 IMPLANT
GLOVE SURG UNDER LTX SZ8 (GLOVE) ×2 IMPLANT
GOWN STRL REUS W/TWL XL LVL3 (GOWN DISPOSABLE) ×4 IMPLANT
KIT BASIN OR (CUSTOM PROCEDURE TRAY) ×2 IMPLANT
KIT TURNOVER KIT A (KITS) ×2 IMPLANT
NEEDLE HYPO 22GX1.5 SAFETY (NEEDLE) ×2 IMPLANT
PACK BASIC VI WITH GOWN DISP (CUSTOM PROCEDURE TRAY) ×2 IMPLANT
PENCIL SMOKE EVACUATOR (MISCELLANEOUS) IMPLANT
SUCTION FRAZIER HANDLE 12FR (TUBING)
SUCTION TUBE FRAZIER 12FR DISP (TUBING) IMPLANT
SURGILUBE 2OZ TUBE FLIPTOP (MISCELLANEOUS) ×2 IMPLANT
SUT CHROMIC 2 0 SH (SUTURE) IMPLANT
SUT CHROMIC 3 0 SH 27 (SUTURE) IMPLANT
SUT VIC AB 2-0 SH 27 (SUTURE) ×2
SUT VIC AB 2-0 SH 27XBRD (SUTURE) ×1 IMPLANT
SUT VIC AB 2-0 UR6 27 (SUTURE) IMPLANT
SWAB COLLECTION DEVICE MRSA (MISCELLANEOUS) IMPLANT
SWAB CULTURE ESWAB REG 1ML (MISCELLANEOUS) IMPLANT
SYR 20ML LL LF (SYRINGE) ×2 IMPLANT
SYR 3ML LL SCALE MARK (SYRINGE) IMPLANT
SYR BULB IRRIG 60ML STRL (SYRINGE) IMPLANT
TOWEL OR 17X26 10 PK STRL BLUE (TOWEL DISPOSABLE) ×2 IMPLANT
TOWEL OR NON WOVEN STRL DISP B (DISPOSABLE) ×2 IMPLANT

## 2020-12-01 NOTE — Progress Notes (Signed)
Patient ID: James Coffey, male   DOB: 03-23-1971, 50 y.o.   MRN: [id number]  1 Day Post-Op Subjective: Pt feeling better s/p I & D.  Lactic acid now improving.  Still on norepinephrine but weaning down.  Objective: Vital signs in last 24 hours: Temp:  [98.2 F (36.8 C)-99.5 F (37.5 C)] 98.6 F (37 C) (06/26 0300) Pulse Rate:  [75-95] 87 (06/26 0630) Resp:  [16-36] 25 (06/26 0630) BP: (79-120)/(56-76) 99/66 (06/26 0630) SpO2:  [93 %-99 %] 96 % (06/26 0630) Weight:  [75.1 kg] 75.1 kg (06/25 0800)  Intake/Output from previous day: 06/25 0701 - 06/26 0700 In: 5037.8 [I.V.:4429.8; IV [id number]:945] Out: 3450 [Urine:3450] Intake/Output this shift: No intake/output data recorded.  Physical Exam:  General: Alert and oriented Abdomen: Abdominal and perineal wounds packed GU: Scrotum mildly erythematous but without crepitus, necrosis, tender to touch as expected  Lab Results: Recent Labs    11/29/20 2302 12/01/20 0543  HGB 12.8* 9.1*  HCT 38.8* 27.9*   CBC Latest Ref Rng & Units 12/01/2020 11/29/2020 11/04/2020  WBC 4.0 - 10.5 K/uL 21.6(H) 30.9(H) 9.8  Hemoglobin 13.0 - 17.0 g/dL 9.1(L) 12.8(L) 10.6(L)  Hematocrit 39.0 - 52.0 % 27.9(L) 38.8(L) 32.5(L)  Platelets 150 - 400 K/uL 284 348 235     BMET Recent Labs    11/30/20 1237 12/01/20 0543  NA 139 137  K 3.7 3.6  CL 109 108  CO2 21* 22  GLUCOSE 160* 182*  BUN 11 9  CREATININE 0.70 0.61  CALCIUM 8.0* 8.2*     Studies/Results:   Assessment/Plan: Necrotizing fasciitis of lower abdomen/perineum/scrotum/penis: Clinically improved.  On Vancomycin and Zosyn.  Cultures pending.  Will need 2nd look procedure.  Will defer to general surgery but would be reasonable to do later today or tomorrow.  Will be available to examine scrotum/penis at that time to determine if further GU debridement necessary.   LOS: 1 day   Dutch Gray 12/01/2020, 7:10 AM

## 2020-12-01 NOTE — Op Note (Signed)
PATIENT:  James Coffey  50 y.o. 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   INDICATION: Pleasant patient who had pain and rectal abscess spontaneously draining.  Was admitted placed on IV antibiotics and appeared to improve.  Had outpatient follow-up with some discomfort and inflammation but no worsening off antibiotics.  Unfortunately developed increasing pain and discomfort the past few days to the point of having chills rigors and severe pain.  Urinary urgency and some urge incontinence.  Underwent emergent operative incision and drainage and debridement of abdominal wall, genital, perirectal regions.  Returns for a second look operation to make sure there is no further progression of Fournier's gangrene or necrotizing fasciitis given the fact that he remains on pressors.  Discussed with patient he agrees to proceed   The anatomy and physiology of skin abscesses was discussed. Pathophysiology of SQ abscess, possible progression to fasciitis & sepsis, etc discussed . I stressed good hygiene & wound care. Possible redebridement was discussed as well.   Possibility of recurrence was discussed. Risks, benefits, alternatives were discussed. I noted a good likelihood this will help address the problem. Risks of anesthesia and other risks discussed. Questions answered. The patient is does wish to proceed.   OR FINDINGS:   No strong evidence  of aggressive persistent spread of necrotizing fasciitis or Fournier's gangrene.  Necrotic fat and suprapubic region sharply debrided.  Also in scrotum and perirectal regions.  New counterincisions made in left anterior right anterior flanks.  New counterincision made in left perineal region.  No evidence of any further spread.  Testicles and corpus cavernosum and urethra exposed from aggressive debridement.  Carefully managed by Dr. Alinda Coffey with urology.   CASE DATA:   Type of patient?: LDOW CASE (Surgical Hospitalist [facility] Inpatient)   Status of Case? EMERGENT Add On   Infection Present At Time Of Surgery (PATOS)?   Purulence, infection   DESCRIPTION:    Informed consent was confirmed. The patient remained on IV antibiotics. The patient underwent general anesthesia without any difficulty. The patient was positioned in low lithotomy. SCDs were active during the entire case.  Packing and dressings were removed from the abdominal, perineal, and perirectal wounds.  These regions were prepped and draped in a sterile fashion. A surgical timeout confirmed our plan.   Dr. Alinda Coffey proceeded with perineal/genital exploration I started with abdominal exploration.  Please see his note.  There was some necrotic fat in the infraumbilical and suprapubic transverse incisions and wounds.  I sharply treated those with Metzenbaum scissors to good bleeding healthy tissue.  Fascia was viable.  My finger could easily passed towards the right and left flanks in the deep subcutaneous tissues along the fascia.  Because this was another 12 cm laterally, I did counterincisions 5 cm on the right anterior flank and left anterior flank.  I encountered viable skin and soft  tissue and fascia and muscle.  No brackish purple-Coffey drainage or purulence there.  I assisted Dr. Alinda Coffey in debriding some necrotic fat off of the scrotal wall and also along the right external ring.  The right spermatic cord testes and base of penis and  urethra appeared viable.  We held off on doing any more aggressive debridement there.  Dr. Alinda Coffey did some suturing to try and help reconstruct to the deep inferior corpus cavernosum to better protect the urethra.  I then focused on the lower perineum.  The long right perineal incision from base of scrotum to right posterior peritoneal region was viable with some edema.  No further necrosis or purulence noted.  Could track behind the midline raphae going to the anterior rectal wall but no more spontaneous purulence and no foul odor or necrotic tissue.  Dr. Alinda Coffey had concerns on the left perineum so I did a counterincision there vertically x5 cm.  Encountered healthy skin and subcutaneous tissue and fascia.  There was no tracking of gas along either medial thigh nor the left posterior buttock.  I focused on the perirectal region.  Again my finger could track along the right lateral rectum above the levators but there were no spontaneous purulence and no significant necrotic fat.  Sphincter complex was intact.  I held off on any more aggressive anal rectal examination or rectal biopsies this time.  We did not send any new specimen since we already done that last time for abscess and tissue culture and pathology.  Given his evidence of septic shock with chills and significant Fournier's gangrene/necrotizing fasciitis, I felt it would be wise to have the patient return to ICU for hemodynamic lability and resuscitation.  Aggressive IV antibiotics.  Will anticipate 3rd look operation in the next 48 hours for dressing removal and exploration/debridement.  Hopefully that will be the last time needed in the operating room.  We will see..  I discussed operative findings, updated the patient's status, discussed probable steps to recovery, and gave postoperative recommendations to the patient's daughter, James Coffey   Recommendations were made.  Questions were answered.  She expressed understanding & appreciation.           James Coffey, M.D., F.A.C.S. Gastrointestinal and Minimally Invasive Surgery [facility] [address], [place], [place] [address] [phone] Main / Paging

## 2020-12-01 NOTE — Transfer of Care (Signed)
Immediate Anesthesia Transfer of Care Note  Patient: [name]  Procedure(s) Performed: INCISION AND DRAINAGE ABSCESS (Perineum)  Patient Location: PACU  Anesthesia Type:General  Level of Consciousness: awake, alert , oriented and patient cooperative  Airway & Oxygen Therapy: Patient Spontanous Breathing and Patient connected to face mask oxygen  Post-op Assessment: Report given to RN and Post -op Vital signs reviewed and stable  Post vital signs: Reviewed and stable  Last Vitals:  Vitals Value Taken Time  BP    Temp    Pulse    Resp    SpO2      Last Pain:  Vitals:   12/01/20 0805  TempSrc: Oral  PainSc:          Complications: No notable events documented.

## 2020-12-01 NOTE — Anesthesia Postprocedure Evaluation (Addendum)
Anesthesia Post Note  Patient: Audiological scientist  Procedure(s) Performed: INCISION AND DRAINAGE ABSCESS (Perineum)     Patient location during evaluation: ICU Anesthesia Type: General Level of consciousness: awake and alert Pain management: pain level controlled Vital Signs Assessment: post-procedure vital signs reviewed and stable Respiratory status: spontaneous breathing, nonlabored ventilation, respiratory function stable and patient connected to nasal cannula oxygen Cardiovascular status: blood pressure returned to baseline and stable Postop Assessment: no apparent nausea or vomiting Anesthetic complications: no   No notable events documented.  Last Vitals:  Vitals:   12/01/20 1159 12/01/20 1300  BP:    Pulse:  87  Resp:  (!) 24  Temp: 36.7 C   SpO2:      Last Pain:  Vitals:   12/01/20 1159  TempSrc: Oral  PainSc:                  Effie Berkshire

## 2020-12-01 NOTE — Anesthesia Preprocedure Evaluation (Signed)
Anesthesia Evaluation  Patient identified by MRN, date of birth, ID band Patient awake    Reviewed: Allergy & Precautions, NPO status , Patient's Chart, lab work & pertinent test results  Airway Mallampati: II  TM Distance: >3 FB Neck ROM: Full    Dental  (+) Edentulous Lower, Dental Advisory Given, Missing,    Pulmonary Current Smoker,    Pulmonary exam normal        Cardiovascular negative cardio ROS   Rhythm:Regular Rate:Normal     Neuro/Psych negative neurological ROS  negative psych ROS   GI/Hepatic Neg liver ROS,   Endo/Other  negative endocrine ROS  Renal/GU      Musculoskeletal   Abdominal Normal abdominal exam  (+)   Peds  Hematology   Anesthesia Other Findings   Reproductive/Obstetrics                             Anesthesia Physical  Anesthesia Plan  ASA: 3  Anesthesia Plan: General   Post-op Pain Management:    Induction: Intravenous, Rapid sequence and Cricoid pressure planned  PONV Risk Score and Plan: 2 and Ondansetron and Midazolam  Airway Management Planned: Oral ETT  Additional Equipment: None  Intra-op Plan:   Post-operative Plan: Extubation in OR  Informed Consent: I have reviewed the patients History and Physical, chart, labs and discussed the procedure including the risks, benefits and alternatives for the proposed anesthesia with the patient or authorized representative who has indicated his/her understanding and acceptance.     Dental advisory given  Plan Discussed with: CRNA  Anesthesia Plan Comments:         Anesthesia Quick Evaluation

## 2020-12-01 NOTE — Progress Notes (Signed)
James Coffey [id number] July 27, 1970  CARE TEAM:  PCP: Pcp, No  Outpatient Care Team: Patient Care Team: Pcp, No as PCP - General [name], MD as Consulting Physician (Urology) [name], MD as Consulting Physician (Gastroenterology) [name], MD as Consulting Physician (Colon and Rectal Surgery)  Inpatient Treatment Team: Treatment Team: Attending Provider: Nolon Nations, MD; Consulting Physician: Edison Pace, Md, MD; Consulting Physician: [name], MD; Rounding Team: Pccm, Md, MD; Charge Nurse: Kathy Breach, RN; Registered Nurse: Duanne Limerick, RN; Technician: Towanda Malkin, NT; Technician: Antoine Primas; Utilization Review: Claudie Leach, RN; Registered Nurse: Rodrigo Ran, RN; Registered Nurse: Renae Gloss, NT   Problem List:   Principal Problem:   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 Active Problems:   Perirectal abscess   Tobacco abuse   Partial-English speaking patient - Prefers [place] or [place] gangrene   1 Day Post-Op  11/30/2020  Procedure(s): INCISION AND DEBRIDEMENT OF PERIRECTAL ABSCESS AND RECTAL WALL BIOPSIES    Assessment  Improved but guarded  [facility] Stay = 1 days)  Plan:  -Well clinically looks more comfortable and white count trending down, it is still elevated increase is on pressors  I think he would benefit from second-look operation today.  Discussed with Dr. Alinda Money who worked to try and be available so we can fully evaluate the patient.  Perhaps adjust the dressing and change but he may require more repeated debridement.  Try and hold off on fecal diversion.  Patient feels better but understands reasoning for second look given these infections can be aggressive and spread.  We will try and update daughter after surgery as well.  -IV ABX - Vanco/Zosyn.  Follow-up on pathology and cultures. -Placed on pressors by anesthesia but critical care not formally consulted.  We will request they double check  things.  Hopefully can wean patient off pressors soon.  IV fluid resuscitation.  Hopefully with volume he can wean down. -VTE prophylaxis- SCDs, etc -mobilize as tolerated to help recovery  Disposition:  Disposition:  The patient is from: Home  Anticipate discharge to:  Home with Home Health  Anticipated Date of Discharge is:  July 2,2022    Barriers to discharge:  Pending Clinical improvement (more likely than not)  Patient currently is NOT MEDICALLY STABLE for discharge from the hospital from a surgery standpoint.      25 minutes spent in review, evaluation, examination, counseling, and coordination of care.   I have reviewed this patient's available data, including medical history, events of note, physical examination and test results as part of my evaluation.  A significant portion of that time was spent in counseling.  Care during the described time interval was provided by me.  12/01/2020    Subjective: (Chief complaint)  Patient feels much less pain now.  Was on 2 pressors now weaned down to 1.  Seen by urology this morning.  Patient did have bowel movement without worsening pain.  Nursing just finished evaluating patient.  Objective:  Vital signs:  Vitals:   12/01/20 0400 12/01/20 0500 12/01/20 0530 12/01/20 0630  BP: 114/73 118/68 114/75 99/66  Pulse: 84 87 83 87  Resp: 20 (!) 21 (!) 24 (!) 25  Temp:      TempSrc:      SpO2: 99% 99% 99% 96%  Weight:        Last BM Date: 11/28/20  Intake/Output   Yesterday:  06/25 0701 - 06/26 0700 In: 5037.8 [I.V.:4429.8; IV [id number]:010] Out: 3450 [  Urine:3450] This shift:  No intake/output data recorded.  Bowel function:  Flatus: YES  BM:  YES  Drain: (No drain)   Physical Exam:  General: Pt awake/alert in no acute distress Eyes: PERRL, normal EOM.  Sclera clear.  No icterus Neuro: CN II-XII intact w/o focal sensory/motor deficits. Lymph: No head/neck/groin lymphadenopathy Psych:  No  delerium/psychosis/paranoia.  Oriented x  HENT: Normocephalic, Mucus membranes moist.  No thrush Neck: Supple, No tracheal deviation.  No obvious thyromegaly Chest: No pain to chest wall compression.  Good respiratory excursion.  No audible wheezing CV:  Pulses intact.  Regular rhythm.  No major extremity edema MS: Normal AROM mjr joints.  No obvious deformity Abdomen: Soft.  Nondistended.  Nontender.  Infraumbilical and suprapubic wounds with dressings in place with old blood no evidence of peritonitis.  No incarcerated hernias.  GU: Perineal and perirectal dressings in place with moderate old blood & contamination. Ext:   No deformity.  No mjr edema.  No cyanosis Skin: No petechiae / purpurea.  No major sores.  Warm and dry    Results:   Cultures: Recent Results (from the past 720 hour(s))  Resp Panel by RT-PCR (Flu A&B, Covid) Nasopharyngeal Swab     Status: None   Collection Time: 11/03/20  8:03 PM   Specimen: Nasopharyngeal Swab; Nasopharyngeal(NP) swabs in vial transport medium  Result Value Ref Range Status   SARS Coronavirus 2 by RT PCR NEGATIVE NEGATIVE Final    Comment: (NOTE) SARS-CoV-2 target nucleic acids are NOT DETECTED.  The SARS-CoV-2 RNA is generally detectable in upper respiratory specimens during the acute phase of infection. The lowest concentration of SARS-CoV-2 viral copies this assay can detect is 138 copies/mL. A negative result does not preclude SARS-Cov-2 infection and should not be used as the sole basis for treatment or other patient management decisions. A negative result may occur with  improper specimen collection/handling, submission of specimen other than nasopharyngeal swab, presence of viral mutation(s) within the areas targeted by this assay, and inadequate number of viral copies(<138 copies/mL). A negative result must be combined with clinical observations, patient history, and epidemiological information. The expected result is  Negative.  Fact Sheet for Patients:  EntrepreneurPulse.com.au  Fact Sheet for Healthcare Providers:  IncredibleEmployment.be  This test is no t yet approved or cleared by the [place] FDA and  has been authorized for detection and/or diagnosis of SARS-CoV-2 by FDA under an Emergency Use Authorization (EUA). This EUA will remain  in effect (meaning this test can be used) for the duration of the COVID-19 declaration under Section 564(b)(1) of the Act, 21 U.S.C.section 360bbb-3(b)(1), unless the authorization is terminated  or revoked sooner.       Influenza A by PCR NEGATIVE NEGATIVE Final   Influenza B by PCR NEGATIVE NEGATIVE Final    Comment: (NOTE) The Xpert Xpress SARS-CoV-2/FLU/RSV plus assay is intended as an aid in the diagnosis of influenza from Nasopharyngeal swab specimens and should not be used as a sole basis for treatment. Nasal washings and aspirates are unacceptable for Xpert Xpress SARS-CoV-2/FLU/RSV testing.  Fact Sheet for Patients: EntrepreneurPulse.com.au  Fact Sheet for Healthcare Providers: IncredibleEmployment.be  This test is not yet approved or cleared by the [place] FDA and has been authorized for detection and/or diagnosis of SARS-CoV-2 by FDA under an Emergency Use Authorization (EUA). This EUA will remain in effect (meaning this test can be used) for the duration of the COVID-19 declaration under Section 564(b)(1) of the Act, 21 U.S.C. section  360bbb-3(b)(1), unless the authorization is terminated or revoked.  Performed at [facility], [place] [address], [place], [place] [address]   Culture, blood (x 2)     Status: None   Collection Time: 11/03/20  8:06 PM   Specimen: BLOOD  Result Value Ref Range Status   Specimen Description   Final    BLOOD RIGHT ANTECUBITAL Performed at [place] [address] [place], [place]  [address]    Special Requests   Final    BOTTLES DRAWN AEROBIC AND ANAEROBIC Blood Culture adequate volume Performed at [place] [address], [place], [place] [address]    Culture   Final    NO GROWTH 5 DAYS Performed at [facility], [place] [address] [place], [place] [address]    Report Status 11/09/2020 FINAL  Final  Culture, blood (x 2)     Status: None   Collection Time: 11/03/20  8:11 PM   Specimen: BLOOD  Result Value Ref Range Status   Specimen Description   Final    BLOOD BLOOD RIGHT HAND Performed at [facility] [address] [place], [place] [address]    Special Requests   Final    BOTTLES DRAWN AEROBIC AND ANAEROBIC Blood Culture results may not be optimal due to an inadequate volume of blood received in culture bottles Performed at [facility] [address] [employer], [place] [address]    Culture   Final    NO GROWTH 5 DAYS Performed at [facility], [place] [address], [place], [place] [address]    Report Status 11/09/2020 FINAL  Final  MRSA PCR Screening     Status: None   Collection Time: 11/04/20 12:13 AM   Specimen: Nasopharyngeal  Result Value Ref Range Status   MRSA by PCR NEGATIVE NEGATIVE Final    Comment:        The GeneXpert MRSA Assay (FDA approved for NASAL specimens only), is one component of a comprehensive MRSA colonization surveillance program. It is not intended to diagnose MRSA infection nor to guide or monitor treatment for MRSA infections. Performed at [facility], [place] [address] [place], [name] [address]   Resp Panel by RT-PCR (Flu A&B, Covid) Nasopharyngeal Swab     Status: None   Collection Time: 11/30/20  2:53 AM   Specimen: Nasopharyngeal Swab; Nasopharyngeal(NP) swabs in vial transport medium  Result Value Ref Range Status   SARS Coronavirus 2 by RT PCR NEGATIVE NEGATIVE Final    Comment: (NOTE) SARS-CoV-2 target nucleic acids are  NOT DETECTED.  The SARS-CoV-2 RNA is generally detectable in upper respiratory specimens during the acute phase of infection. The lowest concentration of SARS-CoV-2 viral copies this assay can detect is 138 copies/mL. A negative result does not preclude SARS-Cov-2 infection and should not be used as the sole basis for treatment or other patient management decisions. A negative result may occur with  improper specimen collection/handling, submission of specimen other than nasopharyngeal swab, presence of viral mutation(s) within the areas targeted by this assay, and inadequate number of viral copies(<138 copies/mL). A negative result must be combined with clinical observations, patient history, and epidemiological information. The expected result is Negative.  Fact Sheet for Patients:  EntrepreneurPulse.com.au  Fact Sheet for Healthcare Providers:  IncredibleEmployment.be  This test is no t yet approved or cleared by the [place] FDA and  has been authorized for detection and/or diagnosis of SARS-CoV-2 by  FDA under an Emergency Use Authorization (EUA). This EUA will remain  in effect (meaning this test can be used) for the duration of the COVID-19 declaration under Section 564(b)(1) of the Act, 21 U.S.C.section 360bbb-3(b)(1), unless the authorization is terminated  or revoked sooner.       Influenza A by PCR NEGATIVE NEGATIVE Final   Influenza B by PCR NEGATIVE NEGATIVE Final    Comment: (NOTE) The Xpert Xpress SARS-CoV-2/FLU/RSV plus assay is intended as an aid in the diagnosis of influenza from Nasopharyngeal swab specimens and should not be used as a sole basis for treatment. Nasal washings and aspirates are unacceptable for Xpert Xpress SARS-CoV-2/FLU/RSV testing.  Fact Sheet for Patients: EntrepreneurPulse.com.au  Fact Sheet for Healthcare Providers: IncredibleEmployment.be  This test is not  yet approved or cleared by the [place] FDA and has been authorized for detection and/or diagnosis of SARS-CoV-2 by FDA under an Emergency Use Authorization (EUA). This EUA will remain in effect (meaning this test can be used) for the duration of the COVID-19 declaration under Section 564(b)(1) of the Act, 21 U.S.C. section 360bbb-3(b)(1), unless the authorization is terminated or revoked.  Performed at [facility], [place] [address] [place], [place] [address]     Labs: Results for orders placed or performed during the hospital encounter of 11/29/20 (from the past 48 hour(s))  CBC with Differential     Status: Abnormal   Collection Time: 11/29/20 11:02 PM  Result Value Ref Range   WBC 30.9 (H) 4.0 - 10.5 K/uL   RBC 4.42 4.22 - 5.81 MIL/uL   Hemoglobin 12.8 (L) 13.0 - 17.0 g/dL   HCT 38.8 (L) 39.0 - 52.0 %   MCV 87.8 80.0 - 100.0 fL   MCH 29.0 26.0 - 34.0 pg   MCHC 33.0 30.0 - 36.0 g/dL   RDW 13.5 11.5 - 15.5 %   Platelets 348 150 - 400 K/uL   nRBC 0.0 0.0 - 0.2 %   Neutrophils Relative % 72 %   Band Neutrophils 18 %   Lymphocytes Relative 5 %   Monocytes Relative 5 %   Eosinophils Relative 0 %   Basophils Relative 0 %   WBC Morphology NORMAL    RBC Morphology NORMAL    Smear Review DONE    nRBC NONE SEEN 0 /100 WBC   Metamyelocytes Relative NONE SEEN %   Myelocytes NONE SEEN %   Promyelocytes Relative NONE SEEN %   Blasts NONE SEEN %    Comment: Performed at [facility], [place] [address], [place], [place] [address]  Comprehensive metabolic panel     Status: Abnormal   Collection Time: 11/29/20 11:02 PM  Result Value Ref Range   Sodium 130 (L) 135 - 145 mmol/L   Potassium 4.0 3.5 - 5.1 mmol/L   Chloride 94 (L) 98 - 111 mmol/L   CO2 23 22 - 32 mmol/L   Glucose, Bld 166 (H) 70 - 99 mg/dL    Comment: Glucose reference range applies only to samples taken after fasting for at least 8 hours.   BUN 15 6 - 20 mg/dL   Creatinine, Ser  1.34 (H) 0.61 - 1.24 mg/dL   Calcium 9.2 8.9 - 10.3 mg/dL   Total Protein 8.0 6.5 - 8.1 g/dL   Albumin 3.2 (L) 3.5 - 5.0 g/dL   AST 20 15 - 41 U/L   ALT 23 0 - 44 U/L   Alkaline Phosphatase 116 38 - 126 U/L  Total Bilirubin 1.1 0.3 - 1.2 mg/dL   GFR, Estimated >60 >60 mL/min    Comment: (NOTE) Calculated using the CKD-EPI Creatinine Equation (2021)    Anion gap 13 5 - 15    Comment: Performed at [facility], [place] [address] [place], [place] [address]  Lactic acid, plasma     Status: Abnormal   Collection Time: 11/29/20 11:02 PM  Result Value Ref Range   Lactic Acid, Venous 3.2 (HH) 0.5 - 1.9 mmol/L    Comment: CRITICAL RESULT CALLED TO, READ BACK BY AND VERIFIED WITH: TABITHA LOCKETT AT 0105 ON 11/30/20 BY Buel Ream Performed at [facility], [place] [address], [place], [place] [address]   Lactic acid, plasma     Status: Abnormal   Collection Time: 11/30/20  1:02 AM  Result Value Ref Range   Lactic Acid, Venous 4.1 (HH) 0.5 - 1.9 mmol/L    Comment: CRITICAL VALUE NOTED.  VALUE IS CONSISTENT WITH PREVIOUSLY REPORTED AND CALLED VALUE. Performed at [facility], [place] [address], [place], [place] [address]   Urinalysis, Routine w reflex microscopic Urine, Clean Catch     Status: Abnormal   Collection Time: 11/30/20  2:00 AM  Result Value Ref Range   Color, Urine YELLOW YELLOW   APPearance HAZY (A) CLEAR   Specific Gravity, Urine 1.034 (H) 1.005 - 1.030   pH 5.0 5.0 - 8.0   Glucose, UA NEGATIVE NEGATIVE mg/dL   Hgb urine dipstick NEGATIVE NEGATIVE   Bilirubin Urine NEGATIVE NEGATIVE   Ketones, ur NEGATIVE NEGATIVE mg/dL   Protein, ur NEGATIVE NEGATIVE mg/dL   Nitrite NEGATIVE NEGATIVE   Leukocytes,Ua NEGATIVE NEGATIVE    Comment: Performed at [facility] [address] [place], [place] [address]  Resp Panel by RT-PCR (Flu A&B, Covid) Nasopharyngeal Swab     Status: None   Collection Time: 11/30/20  2:53 AM    Specimen: Nasopharyngeal Swab; Nasopharyngeal(NP) swabs in vial transport medium  Result Value Ref Range   SARS Coronavirus 2 by RT PCR NEGATIVE NEGATIVE    Comment: (NOTE) SARS-CoV-2 target nucleic acids are NOT DETECTED.  The SARS-CoV-2 RNA is generally detectable in upper respiratory specimens during the acute phase of infection. The lowest concentration of SARS-CoV-2 viral copies this assay can detect is 138 copies/mL. A negative result does not preclude SARS-Cov-2 infection and should not be used as the sole basis for treatment or other patient management decisions. A negative result may occur with  improper specimen collection/handling, submission of specimen other than nasopharyngeal swab, presence of viral mutation(s) within the areas targeted by this assay, and inadequate number of viral copies(<138 copies/mL). A negative result must be combined with clinical observations, patient history, and epidemiological information. The expected result is Negative.  Fact Sheet for Patients:  EntrepreneurPulse.com.au  Fact Sheet for Healthcare Providers:  IncredibleEmployment.be  This test is no t yet approved or cleared by the [place] FDA and  has been authorized for detection and/or diagnosis of SARS-CoV-2 by FDA under an Emergency Use Authorization (EUA). This EUA will remain  in effect (meaning this test can be used) for the duration of the COVID-19 declaration under Section 564(b)(1) of the Act, 21 U.S.C.section 360bbb-3(b)(1), unless the authorization is terminated  or revoked sooner.       Influenza A by PCR NEGATIVE NEGATIVE   Influenza B by PCR NEGATIVE NEGATIVE    Comment: (NOTE) The Xpert Xpress SARS-CoV-2/FLU/RSV plus assay is intended as an aid in  the diagnosis of influenza from Nasopharyngeal swab specimens and should not be used as a sole basis for treatment. Nasal washings and aspirates are unacceptable for Xpert Xpress  SARS-CoV-2/FLU/RSV testing.  Fact Sheet for Patients: EntrepreneurPulse.com.au  Fact Sheet for Healthcare Providers: IncredibleEmployment.be  This test is not yet approved or cleared by the [place] FDA and has been authorized for detection and/or diagnosis of SARS-CoV-2 by FDA under an Emergency Use Authorization (EUA). This EUA will remain in effect (meaning this test can be used) for the duration of the COVID-19 declaration under Section 564(b)(1) of the Act, 21 U.S.C. section 360bbb-3(b)(1), unless the authorization is terminated or revoked.  Performed at [facility], [place] [address], [place], [place] [address]   Lactic acid, plasma     Status: Abnormal   Collection Time: 11/30/20 12:00 PM  Result Value Ref Range   Lactic Acid, Venous 2.7 (HH) 0.5 - 1.9 mmol/L    Comment: CRITICAL VALUE NOTED.  VALUE IS CONSISTENT WITH PREVIOUSLY REPORTED AND CALLED VALUE. Performed at [facility], [place] [address], [place], [place] [address]   Basic metabolic panel     Status: Abnormal   Collection Time: 11/30/20 12:37 PM  Result Value Ref Range   Sodium 139 135 - 145 mmol/L    Comment: DELTA CHECK NOTED   Potassium 3.7 3.5 - 5.1 mmol/L   Chloride 109 98 - 111 mmol/L   CO2 21 (L) 22 - 32 mmol/L   Glucose, Bld 160 (H) 70 - 99 mg/dL    Comment: Glucose reference range applies only to samples taken after fasting for at least 8 hours.   BUN 11 6 - 20 mg/dL   Creatinine, Ser 0.70 0.61 - 1.24 mg/dL   Calcium 8.0 (L) 8.9 - 10.3 mg/dL   GFR, Estimated >60 >60 mL/min    Comment: (NOTE) Calculated using the CKD-EPI Creatinine Equation (2021)    Anion gap 9 5 - 15    Comment: Performed at [facility], [place] [address] [place] [address], [place], [place] [address]  Magnesium     Status: None   Collection Time: 12/01/20  5:43 AM  Result Value Ref Range   Magnesium 1.9 1.7 - 2.4 mg/dL    Comment: Performed at [facility], [place] [address] [place], [place] [address]  Prealbumin     Status: Abnormal   Collection Time: 12/01/20  5:43 AM  Result Value Ref Range   Prealbumin <5 (L) 18 - 38 mg/dL    Comment: Performed at [facility], [place] [address], [place], [place] [address]  Basic metabolic panel     Status: Abnormal   Collection Time: 12/01/20  5:43 AM  Result Value Ref Range   Sodium 137 135 - 145 mmol/L   Potassium 3.6 3.5 - 5.1 mmol/L   Chloride 108 98 - 111 mmol/L   CO2 22 22 - 32 mmol/L   Glucose, Bld 182 (H) 70 - 99 mg/dL    Comment: Glucose reference range applies only to samples taken after fasting for at least 8 hours.   BUN 9 6 - 20 mg/dL   Creatinine, Ser 0.61 0.61 - 1.24 mg/dL   Calcium 8.2 (L) 8.9 - 10.3 mg/dL   GFR, Estimated >60 >60 mL/min    Comment: (NOTE) Calculated using the CKD-EPI Creatinine Equation (2021)    Anion gap 7 5 - 15    Comment: Performed at [facility], [place] [address] [place],  [place] [address]  CBC with Differential/Platelet     Status: Abnormal   Collection Time: 12/01/20  5:43 AM  Result Value Ref Range   WBC 21.6 (H) 4.0 - 10.5 K/uL   RBC 3.14 (L) 4.22 - 5.81 MIL/uL   Hemoglobin 9.1 (L) 13.0 - 17.0 g/dL   HCT 27.9 (L) 39.0 - 52.0 %   MCV 88.9 80.0 - 100.0 fL   MCH 29.0 26.0 - 34.0 pg   MCHC 32.6 30.0 - 36.0 g/dL   RDW 13.8 11.5 - 15.5 %   Platelets 284 150 - 400 K/uL   nRBC 0.0 0.0 - 0.2 %   Neutrophils Relative % 86 %   Neutro Abs 18.7 (H) 1.7 - 7.7 K/uL   Lymphocytes Relative 6 %   Lymphs Abs 1.2 0.7 - 4.0 K/uL   Monocytes Relative 6 %   Monocytes Absolute 1.2 (H) 0.1 - 1.0 K/uL   Eosinophils Relative 0 %   Eosinophils Absolute 0.0 0.0 - 0.5 K/uL   Basophils Relative 0 %   Basophils Absolute 0.1 0.0 - 0.1 K/uL   Immature Granulocytes 2 %   Abs Immature Granulocytes 0.40 (H) 0.00 - 0.07 K/uL    Comment: Performed at [facility], [place] [address] [place], [place]  [address]    Imaging / Studies: CT ABDOMEN PELVIS W CONTRAST  Result Date: 11/30/2020 CLINICAL DATA:  Lower abdominal pain.  Recent perianal abscess. EXAM: CT ABDOMEN AND PELVIS WITH CONTRAST TECHNIQUE: Multidetector CT imaging of the abdomen and pelvis was performed using the standard protocol following bolus administration of intravenous contrast. CONTRAST:  118mL OMNIPAQUE IOHEXOL 300 MG/ML  SOLN COMPARISON:  Abdominopelvic CT 11/03/2020 FINDINGS: Lower chest: No acute airspace disease or pleural effusion. Heart is normal in size. Hepatobiliary: Borderline hepatic steatosis. No focal liver lesion. Gallbladder physiologically distended, no calcified stone. No biliary dilatation. Pancreas: No ductal dilatation or inflammation. Mild motion artifact through the pancreas. Spleen: Normal in size without focal abnormality. Adrenals/Urinary Tract: Minimal left adrenal thickening. No dominant adrenal nodule. No hydronephrosis or perinephric edema. Homogeneous renal enhancement with symmetric excretion on delayed phase imaging. Small cyst in the medial right kidney is unchanged. Urinary bladder is physiologically distended without wall thickening. Stomach/Bowel: Inflammation and soft tissue air, no drainable collection mild rectal wall thickening. No additional sites of colonic wall thickening. Moderate colonic stool burden. Appendix not definitively visualized, no evidence of appendicitis. Decompressed small bowel without inflammation. Unremarkable stomach. Vascular/Lymphatic: Moderate aortic atherosclerosis with calcified noncalcified atheromatous plaque. Patent portal vein. Patent splenic veins. There is an irregular presacral nodule measuring 2.2 cm, series 2, image 74, this may represent an enlarged lymph node but is nonspecific. There prominent bilateral inguinal lymph nodes. Reproductive: Peroneal air previously localized to the right buttock and peritoneal and has spread anteriorly into the base of the penis and  both scrotum. Patchy air seen within both scrotal sacs, right greater than left. Minimal air tracks adjacent to the left aspect of the prostate. Other: Air in soft tissue inflammation about the right buttock has progressed, with air-fluid collection in the right perianal region. Patchy soft tissue gas extends anteriorly into the perineum into both scrotum, base of the penis, and anterior abdominal wall. Few foci of soft tissue gas track adjacent to the prostate gland. No frank pneumoperitoneum or ascites. Musculoskeletal: There are no acute or suspicious osseous abnormalities. Occasional bone islands in the pelvis. IMPRESSION: 1. Worsening perineal inflammation, including right peri-rectal. Significant progression of soft tissue gas, now extending  anteriorly into the perineum, base of the penis, both scrotal sacs and anterior abdominal wall. Findings are highly suspicious for necrotizing soft tissue infection, and urgent surgical consult is recommended. 2. Irregular presacral nodule measuring 2.2 cm may represent an enlarged lymph node but is nonspecific. Recommend follow-up after course of treatment. There is rectal wall thickening which may be inflammatory, however neoplasm is not excluded. Aortic Atherosclerosis (ICD10-I70.0). These results were called by telephone at the time of interpretation on 11/30/2020 at 2:01 am to provider Lattie Haw, who will convery the results urgently to [facility] , who verbally acknowledged these results. Electronically Signed   By: Keith Rake M.D.   On: 11/30/2020 02:02   DG CHEST PORT 1 VIEW  Result Date: 11/30/2020 CLINICAL DATA:  Right central line placement EXAM: PORTABLE CHEST 1 VIEW COMPARISON:  March 25, 2007 FINDINGS: A right central line is been placed in the interval with the distal tip in the central SVC. No pneumothorax. The lower chest is not completely included on today's film, particularly on the left. Mild increased haziness in the left upper lobe. No other  acute abnormalities. IMPRESSION: 1. A right central line has been placed in the interval with the distal tip in the SVC. No pneumothorax. 2. Limited study as the lung bases were not completely included and the patient is rotated. There is mild haziness in the left upper lung which could be due to positioning or subtle infiltrate. A better positioned PA and lateral chest x-ray could better evaluate. Electronically Signed   By: Dorise Bullion III M.D   On: 11/30/2020 09:07    Medications / Allergies: per chart  Antibiotics: Anti-infectives (From admission, onward)    Start     Dose/Rate Route Frequency Ordered Stop   11/30/20 1500  vancomycin (VANCOREADY) IVPB 1250 mg/250 mL        1,250 mg 166.7 mL/hr over 90 Minutes Intravenous Every 12 hours 11/30/20 0606     11/30/20 0600  piperacillin-tazobactam (ZOSYN) IVPB 3.375 g        3.375 g 12.5 mL/hr over 240 Minutes Intravenous Every 8 hours 11/30/20 0237     11/30/20 0403  piperacillin-tazobactam (ZOSYN) 3.375 GM/50ML IVPB       Note to Pharmacy: Hedy Camara   : cabinet override      11/30/20 0403 12/01/20 0206   11/30/20 0230  clindamycin (CLEOCIN) IVPB 600 mg  Status:  Discontinued        600 mg 100 mL/hr over 30 Minutes Intravenous  Once 11/30/20 0219 11/30/20 0237   11/30/20 0000  ceFEPIme (MAXIPIME) 2 g in sodium chloride 0.9 % 100 mL IVPB        2 g 200 mL/hr over 30 Minutes Intravenous  Once 11/29/20 2347 11/30/20 0033   11/30/20 0000  vancomycin (VANCOREADY) IVPB 1500 mg/300 mL        1,500 mg 150 mL/hr over 120 Minutes Intravenous  Once 11/29/20 2347 11/30/20 0300   11/29/20 2345  vancomycin (VANCOCIN) IVPB 1000 mg/200 mL premix  Status:  Discontinued        1,000 mg 200 mL/hr over 60 Minutes Intravenous  Once 11/29/20 2340 11/29/20 2347   11/29/20 2345  ceFEPIme (MAXIPIME) 1 g in sodium chloride 0.9 % 100 mL IVPB  Status:  Discontinued        1 g 200 mL/hr over 30 Minutes Intravenous  Once 11/29/20 2340 11/29/20 2347          Note: Portions of this report may  have been transcribed using voice recognition software. Every effort was made to ensure accuracy; however, inadvertent computerized transcription errors may be present.   Any transcriptional errors that result from this process are unintentional.    Adin Hector, MD, FACS, MASCRS Esophageal, Gastrointestinal & Colorectal Surgery Robotic and Minimally Invasive Surgery  [facility] [address], [place], [place] [address] [phone] Fax [phone] Main/Paging  CONTACT INFORMATION: Weekday (9AM-5PM) concerns: Call [facility] main office at [phone] Weeknight (5PM-9AM) or Weekend/Holiday concerns: Check [email or web address] for General Surgery [facility] coverage (Please, do not use SecureChat as it is not reliable communication to operating surgeons for immediate patient care)      12/01/2020  7:47 AM

## 2020-12-01 NOTE — Addendum Note (Signed)
Addendum  created 12/01/20 1359 by Effie Berkshire, MD   Clinical Note Signed

## 2020-12-01 NOTE — Anesthesia Procedure Notes (Signed)
Procedure Name: Intubation Date/Time: 12/01/2020 8:56 AM Performed by: Cleda Daub, CRNA Pre-anesthesia Checklist: Patient identified, Emergency Drugs available, Suction available and Patient being monitored Patient Re-evaluated:Patient Re-evaluated prior to induction Oxygen Delivery Method: Circle system utilized Preoxygenation: Pre-oxygenation with 100% oxygen Induction Type: IV induction Ventilation: Mask ventilation without difficulty Laryngoscope Size: Mac and 3 Grade View: Grade I Tube type: Oral Tube size: 7.5 mm Number of attempts: 1 Airway Equipment and Method: Stylet and Oral airway Placement Confirmation: ETT inserted through vocal cords under direct vision, positive ETCO2 and breath sounds checked- equal and bilateral Secured at: 21 cm Tube secured with: Tape Dental Injury: Teeth and Oropharynx as per pre-operative assessment

## 2020-12-01 NOTE — Progress Notes (Signed)
NAMERaye Coffey, MRN:  [id number], DOB:  31-Oct-1970, LOS: 1 ADMISSION DATE:  11/29/2020, CONSULTATION DATE:  11/30/20 REFERRING MD:  Dr Georgette Dover, CHIEF COMPLAINT: Septic shock secondary to Fournier's gangrene  History of Present Illness:   James Coffey was admitted with Fournier's gangrene Post I&D and debridement Following surgery has remained hypotensive, on dual BP meds Consult was called to assist with management of septic shock  He was treated about a month ago for a perirectal abscess-improved with antibiotics Started having pain and discomfort, urinary frequency, frequent bowel motions Came in with very tender scrotum and CT revealed gas at the base of the scrotum and base of penis Tachycardic, hypotensive Smoker   Pertinent  Medical History  History reviewed. No pertinent past medical history.    Significant Hospital Events: Including procedures, antibiotic start and stop dates in addition to other pertinent events   I&D and debridement 6/25 Central line placement 6/25 6/26 Dr. Jaymes Graff for review and further debridement  Cefepime 6/24 Zosyn 6/24>> Vancomycin 6/24>>  Interim History / Subjective:  Awake and alert, does have some pain and discomfort Daughter at bedside middle-age gentleman  Objective   Blood pressure 94/68, pulse 88, temperature 98.6 F (37 C), temperature source Oral, resp. rate (!) 26, weight 75.1 kg, SpO2 97 %.        Intake/Output Summary (Last 24 hours) at 12/01/2020 1051 Last data filed at 12/01/2020 1039 Gross per 24 hour  Intake 6305.14 ml  Output 3460 ml  Net 2845.14 ml   Filed Weights   11/30/20 0800  Weight: 75.1 kg    Examination: General: Does not appear to be in distress HENT: Moist oral mucosa Lungs: Clear breath sounds bilaterally Cardiovascular: S1-S2 appreciated Abdomen: Bowel sounds appreciated, postsurgical changes in perineum Extremities: No clubbing, no edema Neuro: Alert and oriented x3 GU: Good urine  output  Labs/imaging that I have personally reviewed  (right click and "Reselect all SmartList Selections" daily)  Blood work reviewed significant for leukocytosis  Resolved Hospital Problem list     Assessment & Plan:  Septic shock secondary to Fournier's gangrene -On Levophed -Continue fluid resuscitation -On Zosyn and vancomycin -Blood cultures negative  Fournier's gangrene -Post I&D and debridement 6/25 -Reevaluated 6/26 -Anticipate reevaluation in 48 hours  Acute kidney injury secondary to sepsis -Avoid nephrotoxic's -Trend electrolytes -Maintain renal perfusion  Hypotension -related to septic shock  Active smoker -Smoking cessation counseling when appropriate -Nicotine patch placed  Leukocytosis -Trending better  Best Practice (right click and "Reselect all SmartList Selections" daily)   Diet/type: Regular consistency (see orders) Pain/Anxiety/Delirium protocol Yes VAP protocol (if indicated): Not indicated DVT prophylaxis: prophylactic heparin  GI prophylaxis: N/A Glucose control:  not indicated Central venous access:  Yes, and it is still needed Arterial line:  N/A Foley:  Yes, and it is still needed Mobility:  bed rest  PT consulted: N/A Studies pending: None Culture data pending:blood Last reviewed culture data:today Antibiotics:zosyn and vanc Antibiotic de-escalation: no,  continue current rx Stop date: to be determined  Code Status:  full code Last date of multidisciplinary goals of care discussion [pending] ccm prognosis: Life-threating and Serious Disposition: remains critically ill, will stay in intensive care   Labs   CBC: Recent Labs  Lab 11/29/20 2302 12/01/20 0543  WBC 30.9* 21.6*  NEUTROABS  --  18.7*  HGB 12.8* 9.1*  HCT 38.8* 27.9*  MCV 87.8 88.9  PLT 348 784    Basic Metabolic Panel: Recent Labs  Lab 11/29/20 2302 11/30/20 1237  12/01/20 0543 12/01/20 0800  NA 130* 139 137 136  K 4.0 3.7 3.6 3.5  CL 94* 109 108  107  CO2 23 21* 22 22  GLUCOSE 166* 160* 182* 177*  BUN 15 11 9 9   CREATININE 1.34* 0.70 0.61 0.53*  CALCIUM 9.2 8.0* 8.2* 8.1*  MG  --   --  1.9  --    GFR: Estimated Creatinine Clearance: 116 mL/min (A) (by C-G formula based on SCr of 0.53 mg/dL (L)). Recent Labs  Lab 11/29/20 2302 11/30/20 0102 11/30/20 1200 12/01/20 0543  WBC 30.9*  --   --  21.6*  LATICACIDVEN 3.2* 4.1* 2.7*  --     Liver Function Tests: Recent Labs  Lab 11/29/20 2302  AST 20  ALT 23  ALKPHOS 116  BILITOT 1.1  PROT 8.0  ALBUMIN 3.2*   No results for input(s): LIPASE, AMYLASE in the last 168 hours. No results for input(s): AMMONIA in the last 168 hours.  ABG No results found for: PHART, PCO2ART, PO2ART, HCO3, TCO2, ACIDBASEDEF, O2SAT   Coagulation Profile: No results for input(s): INR, PROTIME in the last 168 hours.  Cardiac Enzymes: No results for input(s): CKTOTAL, CKMB, CKMBINDEX, TROPONINI in the last 168 hours.  HbA1C: Hgb A1c MFr Bld  Date/Time Value Ref Range Status  11/04/2020 03:30 AM 5.6 4.8 - 5.6 % Final    Comment:    (NOTE)         Prediabetes: 5.7 - 6.4         Diabetes: >6.4         Glycemic control for adults with diabetes: <7.0     CBG: No results for input(s): GLUCAP in the last 168 hours.  Review of Systems:   Admits to pain and discomfort around surgical site  Past Medical History:  He,  has no past medical history on file.   Surgical History:   Past Surgical History:  Procedure Laterality Date   HERNIA REPAIR     INCISION AND DRAINAGE PERIRECTAL ABSCESS N/A 11/30/2020   Procedure: INCISION AND DEBRIDEMENT OF PERIRECTAL ABSCESS AND RECTAL WALL BIOPSIES;  Surgeon: Michael Boston, MD;  Location: [facility];  Service: General;  Laterality: N/A;   INGUINAL HERNIA REPAIR Right 2007     Social History:   reports that he has been smoking cigarettes. He has been smoking an average of 1.00 packs per day. He has never used smokeless tobacco. He reports that he does  not drink alcohol and does not use drugs.   Family History:  His family history is negative for CAD and Inflammatory bowel disease.   Allergies No Known Allergies   Home Medications  Prior to Admission medications   Medication Sig Start Date End Date Taking? Authorizing Provider  acetaminophen (TYLENOL) 325 MG tablet Take 2 tablets (650 mg total) by mouth every 6 (six) hours as needed for mild pain (or Fever >/= 101). 11/05/20   Domenic Polite, MD  ibuprofen (ADVIL) 200 MG tablet Take 200 mg by mouth every 6 (six) hours as needed for mild pain.    [provider]   The patient is critically ill with multiple organ systems failure and requires high complexity decision making for assessment and support, frequent evaluation and titration of therapies, application of advanced monitoring technologies and extensive interpretation of multiple databases. Critical Care Time devoted to patient care services described in this note independent of APP/resident time (if applicable)  is 30 minutes.   Sherrilyn Rist MD [facility] Personal pager: [phone] If unanswered, please page CCM On-call: [phone]

## 2020-12-01 NOTE — Op Note (Signed)
Preoperative diagnosis: Fournier's gangrene with necrotizing fasciitis of the perirectal, perineal, scrotal, penile, and abdominal regions  Postoperative diagnosis: Fournier's gangrene with necrotizing fasciitis of the perirectal, perineal, scrotal, penile, and abdominal regions  Procedures: Irrigation and debridement of scrotal and penile wounds  Co-surgeons: Dr. [name] and Dr. Neysa Bonito  Anesthesia: General  Complications: None  EBL: Minimal  Specimens: None  Indication: James Coffey is a 50 year old gentleman who presented early yesterday with necrotizing fasciitis of the perirectal, perineal, scrotal, penile, and abdominal regions.  He underwent initial incision and drainage/debridement approximately 36 hours ago.  It was felt prudent to take him back for further evaluation to reexamine his wound and proceed with any additional debridement as necessary.  The potential risks, complications, expected recovery process were discussed.  Informed consent was obtained.  Description of procedure: The full details of the procedure have been dictated by Dr. Johney Maine.  Please see his operative note for full details.  During urologic inspection, there was evidence of some remaining necrotic tissue toward the left of the corpora cavernosa and adjacent to the urethra that was excised.  There was some overlying viable tissue that was reapproximated over the urethra for some additional coverage and secured with a 2-0 Vicryl suture.  All additional wounds particular around the scrotum and penis appeared clean and viable.  The posterior scrotum was mildly erythematous and edematous but also appeared to be viable.  It was not felt that additional debridement from a urologic perspective was necessary.  These wounds were repacked with saline wet to gauze dressings.  It is likely that he will be taken back for further evaluation in the operating room later in the week.

## 2020-12-02 ENCOUNTER — Encounter (HOSPITAL_COMMUNITY): Payer: Self-pay | Admitting: Surgery

## 2020-12-02 DIAGNOSIS — R7303 Prediabetes: Secondary | ICD-10-CM

## 2020-12-02 DIAGNOSIS — R739 Hyperglycemia, unspecified: Secondary | ICD-10-CM

## 2020-12-02 DIAGNOSIS — R6521 Severe sepsis with septic shock: Secondary | ICD-10-CM

## 2020-12-02 DIAGNOSIS — A419 Sepsis, unspecified organism: Secondary | ICD-10-CM

## 2020-12-02 LAB — BLOOD CULTURE ID PANEL (REFLEXED) - BCID2

## 2020-12-02 LAB — CBC
HCT: 25.5 % — ABNORMAL LOW (ref 39.0–52.0)
Hemoglobin: 8.2 g/dL — ABNORMAL LOW (ref 13.0–17.0)
MCH: 28.2 pg (ref 26.0–34.0)
MCHC: 32.2 g/dL (ref 30.0–36.0)
MCV: 87.6 fL (ref 80.0–100.0)
Platelets: 288 10*3/uL (ref 150–400)
RBC: 2.91 MIL/uL — ABNORMAL LOW (ref 4.22–5.81)
RDW: 14.1 % (ref 11.5–15.5)
WBC: 15.8 10*3/uL — ABNORMAL HIGH (ref 4.0–10.5)
nRBC: 0 % (ref 0.0–0.2)

## 2020-12-02 LAB — GLUCOSE, CAPILLARY
Glucose-Capillary: 166 mg/dL — ABNORMAL HIGH (ref 70–99)
Glucose-Capillary: 184 mg/dL — ABNORMAL HIGH (ref 70–99)

## 2020-12-02 LAB — CREATININE, SERUM
Creatinine, Ser: 0.58 mg/dL — ABNORMAL LOW (ref 0.61–1.24)
GFR, Estimated: >60 mL/min (ref >60)

## 2020-12-02 MED ORDER — ACETAMINOPHEN 325 MG PO TABS
650.0000 mg | ORAL_TABLET | Freq: Four times a day (QID) | ORAL | Status: DC | PRN
  Administered 2020-12-02: 650 mg via ORAL
  Filled 2020-12-02: qty 2

## 2020-12-02 MED ORDER — INSULIN ASPART 100 UNIT/ML IJ SOLN
1.0000 [IU] | Freq: Three times a day (TID) | INTRAMUSCULAR | Status: DC
  Administered 2020-12-02: 2 [IU] via SUBCUTANEOUS
  Administered 2020-12-03: 1 [IU] via SUBCUTANEOUS
  Administered 2020-12-03: 2 [IU] via SUBCUTANEOUS
  Administered 2020-12-03: 1 [IU] via SUBCUTANEOUS
  Administered 2020-12-04: 2 [IU] via SUBCUTANEOUS
  Administered 2020-12-04: 1 [IU] via SUBCUTANEOUS
  Administered 2020-12-05 (×2): 2 [IU] via SUBCUTANEOUS
  Administered 2020-12-05 – 2020-12-06 (×2): 1 [IU] via SUBCUTANEOUS
  Administered 2020-12-06: 3 [IU] via SUBCUTANEOUS
  Administered 2020-12-06: 1 [IU] via SUBCUTANEOUS
  Administered 2020-12-07: 2 [IU] via SUBCUTANEOUS
  Administered 2020-12-07 (×2): 1 [IU] via SUBCUTANEOUS
  Administered 2020-12-08: 3 [IU] via SUBCUTANEOUS
  Administered 2020-12-08: 2 [IU] via SUBCUTANEOUS
  Administered 2020-12-08 – 2020-12-09 (×3): 1 [IU] via SUBCUTANEOUS
  Administered 2020-12-10 – 2020-12-11 (×3): 2 [IU] via SUBCUTANEOUS
  Administered 2020-12-12: 1 [IU] via SUBCUTANEOUS

## 2020-12-02 MED ORDER — LINEZOLID 600 MG/300ML IV SOLN
600.0000 mg | Freq: Two times a day (BID) | INTRAVENOUS | Status: DC
  Administered 2020-12-02 – 2020-12-03 (×4): 600 mg via INTRAVENOUS
  Filled 2020-12-02 (×5): qty 300

## 2020-12-02 NOTE — Progress Notes (Signed)
NAMEIren Coffey, MRN:  [id number], DOB:  [birth year], LOS: 2 ADMISSION DATE:  11/29/2020, CONSULTATION DATE:  11/30/20 REFERRING MD:  Dr Georgette Dover, CHIEF COMPLAINT: Septic shock secondary to Fournier's gangrene  History of Present Illness:   James Coffey was admitted with Fournier's gangrene Post I&D and debridement Following surgery has remained hypotensive, on dual BP meds Consult was called to assist with management of septic shock  He was treated about a month ago for a perirectal abscess-improved with antibiotics Started having pain and discomfort, urinary frequency, frequent bowel motions Came in with very tender scrotum and CT revealed gas at the base of the scrotum and base of penis Tachycardic, hypotensive Smoker   Pertinent  Medical History  History reviewed. No pertinent past medical history.    Significant Hospital Events: Including procedures, antibiotic start and stop dates in addition to other pertinent events   I&D and debridement 6/25 Central line placement 6/25 6/26 Dr. Jaymes Graff for review and further debridement  Cefepime 6/24 Zosyn 6/24>> Vancomycin 6/24>>  Interim History / Subjective:  No overnight events, he states that his pain is controlled and he is hungry.  Remains on Levophed 41mcg   Objective   Blood pressure 119/61, pulse 84, temperature 99.4 F (37.4 C), temperature source Oral, resp. rate (!) 25, weight 75.1 kg, SpO2 94 %.        Intake/Output Summary (Last 24 hours) at 12/02/2020 1209 Last data filed at 12/02/2020 1059 Gross per 24 hour  Intake 843.4 ml  Output 1250 ml  Net -406.6 ml    Filed Weights   11/30/20 0800  Weight: 75.1 kg   General:  well-nourished M, awake and in no distress HEENT: MM pink/moist Neuro: awake, conversational and oriented x3 CV: s1s2 rrr, no m/r/g PULM:  slightly decreased air entry in the bilateral bases without rhonchi or wheezing, room air with no tachypnea or accessory muscle use GI: soft, bsx4 active   Extremities: warm/dry, no edema  Skin: no rashes or lesions   Labs/imaging that I have personally reviewed  (right click and "Reselect all SmartList Selections" daily)  Blood work reviewed significant for improving leukocytosis Normal renal function No major electrolyte abnormalities   Resolved Hospital Problem list   AKI  Assessment & Plan:    Septic shock secondary to Fournier's Gangrene of the perirectal, scrotal, penile and abdominal regions S/p incision and drainage surgical debridement on 6/25 and 6/26 Surgery and urology following White blood cell count beginning to downtrend P: -May need to return to the OR tomorrow, okay per surgery for patient to eat today -Continue vancomycin and Zosyn, blood culture 1/2 growing gram variable anaerobic rods-speciation pending -Continue Foley -Continue Levophed to maintain MAP> 65      Tobacco use disorder -Nicotine patch -Discussed cessation when appropriate    Best Practice (right click and "Reselect all SmartList Selections" daily)   Diet/type: Regular consistency (see orders) Pain/Anxiety/Delirium protocol Yes VAP protocol (if indicated): Not indicated DVT prophylaxis: prophylactic heparin  GI prophylaxis: N/A Glucose control:  not indicated Central venous access:  Yes, and it is still needed Arterial line:  N/A Foley:  Yes, and it is still needed Mobility:  bed rest  PT consulted: N/A Studies pending: None Culture data pending:blood Last reviewed culture data:today Antibiotics:zosyn and vanc Antibiotic de-escalation: no,  continue current rx Stop date: to be determined  Code Status:  full code Last date of multidisciplinary goals of care discussion [pending] ccm prognosis: Life-threating and Serious Disposition: remains critically ill, will stay  in intensive care   Labs   CBC: Recent Labs  Lab 11/29/20 2302 12/01/20 0543 12/02/20 0353  WBC 30.9* 21.6* 15.8*  NEUTROABS  --  18.7*  --   HGB 12.8*  9.1* 8.2*  HCT 38.8* 27.9* 25.5*  MCV 87.8 88.9 87.6  PLT 348 284 288     Basic Metabolic Panel: Recent Labs  Lab 11/29/20 2302 11/30/20 1237 12/01/20 0543 12/01/20 0800 12/02/20 0353  NA 130* 139 137 136  --   K 4.0 3.7 3.6 3.5  --   CL 94* 109 108 107  --   CO2 23 21* 22 22  --   GLUCOSE 166* 160* 182* 177*  --   BUN 15 11 9 9   --   CREATININE 1.34* 0.70 0.61 0.53* 0.58*  CALCIUM 9.2 8.0* 8.2* 8.1*  --   MG  --   --  1.9  --   --     GFR: Estimated Creatinine Clearance: 116 mL/min (A) (by C-G formula based on SCr of 0.58 mg/dL (L)). Recent Labs  Lab 11/29/20 2302 11/30/20 0102 11/30/20 1200 12/01/20 0543 12/02/20 0353  WBC 30.9*  --   --  21.6* 15.8*  LATICACIDVEN 3.2* 4.1* 2.7*  --   --      Liver Function Tests: Recent Labs  Lab 11/29/20 2302  AST 20  ALT 23  ALKPHOS 116  BILITOT 1.1  PROT 8.0  ALBUMIN 3.2*    No results for input(s): LIPASE, AMYLASE in the last 168 hours. No results for input(s): AMMONIA in the last 168 hours.  ABG No results found for: PHART, PCO2ART, PO2ART, HCO3, TCO2, ACIDBASEDEF, O2SAT   Coagulation Profile: No results for input(s): INR, PROTIME in the last 168 hours.  Cardiac Enzymes: No results for input(s): CKTOTAL, CKMB, CKMBINDEX, TROPONINI in the last 168 hours.  HbA1C: Hgb A1c MFr Bld  Date/Time Value Ref Range Status  11/04/2020 03:30 AM 5.6 4.8 - 5.6 % Final    Comment:    (NOTE)         Prediabetes: 5.7 - 6.4         Diabetes: >6.4         Glycemic control for adults with diabetes: <7.0     CBG: No results for input(s): GLUCAP in the last 168 hours.  Review of Systems:   Admits to pain and discomfort around surgical site  Past Medical History:  He,  has no past medical history on file.   Surgical History:   Past Surgical History:  Procedure Laterality Date   HERNIA REPAIR     INCISION AND DRAINAGE ABSCESS N/A 12/01/2020   Procedure: INCISION AND DRAINAGE ABSCESS;  Surgeon: Michael Boston, MD;   Location: [facility];  Service: General;  Laterality: N/A;   INCISION AND DRAINAGE PERIRECTAL ABSCESS N/A 11/30/2020   Procedure: INCISION AND DEBRIDEMENT OF PERIRECTAL ABSCESS AND RECTAL WALL BIOPSIES;  Surgeon: Michael Boston, MD;  Location: [facility];  Service: General;  Laterality: N/A;   INGUINAL HERNIA REPAIR Right 2007     Social History:   reports that he has been smoking cigarettes. He has been smoking an average of 1.00 packs per day. He has never used smokeless tobacco. He reports that he does not drink alcohol and does not use drugs.   Family History:  His family history is negative for CAD and Inflammatory bowel disease.   Allergies No Known Allergies   Home Medications  Prior to Admission medications   Medication Sig Start  Date End Date Taking? Authorizing Provider  acetaminophen (TYLENOL) 325 MG tablet Take 2 tablets (650 mg total) by mouth every 6 (six) hours as needed for mild pain (or Fever >/= 101). 11/05/20   Domenic Polite, MD  ibuprofen (ADVIL) 200 MG tablet Take 200 mg by mouth every 6 (six) hours as needed for mild pain.    [provider]   CRITICAL CARE Performed by: Otilio Carpen Oneita Allmon   Total critical care time: 33 minutes  Critical care time was exclusive of separately billable procedures and treating other patients.  Critical care was necessary to treat or prevent imminent or life-threatening deterioration.  Critical care was time spent personally by me on the following activities: development of treatment plan with patient and/or surrogate as well as nursing, discussions with consultants, evaluation of patient's response to treatment, examination of patient, obtaining history from patient or surrogate, ordering and performing treatments and interventions, ordering and review of laboratory studies, ordering and review of radiographic studies, pulse oximetry and re-evaluation of patient's condition.   Otilio Carpen Sheriece Jefcoat, PA-C [facility] See Amion for pager If no response to pager , please call [phone] until 7pm After 7:00 pm call Elink  [phone]?[place]

## 2020-12-02 NOTE — TOC Initial Note (Signed)
Transition of Care [facility]) - Initial/Assessment Note    Patient Details  Name: James Coffey MRN: [id number] Date of Birth: 29-May-1971  Transition of Care [facility]) CM/SW Contact:    Leeroy Cha, RN Phone Number: 12/02/2020, 8:27 AM  Clinical Narrative:                 50 year old gentleman who presented early yesterday with necrotizing fasciitis of the perirectal, perineal, scrotal, penile, and abdominal regions.  He underwent initial incision and drainage/debridement approximately 36 hours ago.  It was felt prudent to take him back for further evaluation to reexamine his wound and proceed with any additional debridement as necessary.  The potential risks, complications, expected recovery process were discussed.  Informed consent was obtained.  Description of procedure: The full details of the procedure have been dictated by Dr. Johney Maine.  Please see his operative note for full details.  During urologic inspection, there was evidence of some remaining necrotic tissue toward the left of the corpora cavernosa and adjacent to the urethra that was excised.  There was some overlying viable tissue that was reapproximated over the urethra for some additional coverage and secured with a 2-0 Vicryl suture.  All additional wounds particular around the scrotum and penis appeared clean and viable.  The posterior scrotum was mildly erythematous and edematous but also appeared to be viable.  It was not felt that additional debridement from a urologic perspective was necessary.  These wounds were repacked with saline wet to gauze dressings.  It is likely that he will be taken back for further evaluation in the operating room later in the week. TOC PLAN: POSSIBLE HOME WITH HHC, OR THIS WEEK, WILL FOLLOW FOR PROGRESSION. Expected Discharge Plan: [place] Barriers to Discharge: Continued Medical Work up   Patient Goals and CMS Choice Patient states their goals for this hospitalization and ongoing  recovery are:: to go home and be able to drive CMS Medicare.gov Compare Post Acute Care list provided to:: Patient    Expected Discharge Plan and Services Expected Discharge Plan: [name]   Discharge Planning Services: CM Consult   Living arrangements for the past 2 months: Apartment                                      Prior Living Arrangements/Services Living arrangements for the past 2 months: Apartment Lives with:: Spouse Patient language and need for interpreter reviewed:: Yes Do you feel safe going back to the place where you live?: Yes            Criminal Activity/Legal Involvement Pertinent to Current Situation/Hospitalization: No - Comment as needed  Activities of Daily Living      Permission Sought/Granted                  Emotional Assessment Appearance:: Appears stated age Attitude/Demeanor/Rapport: Engaged Affect (typically observed): Calm Orientation: : Oriented to Place, Oriented to Self, Oriented to  Time, Oriented to Situation Alcohol / Substance Use: Not Applicable Psych Involvement: No (comment)  Admission diagnosis:  Cellulitis of perineum [N82.956] Perirectal abscess [K61.1] 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] Patient Active Problem List   Diagnosis Date Noted   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

## 2020-12-02 NOTE — Progress Notes (Addendum)
1 Day Post-Op  Subjective: CC: Seen for dressing change. Less pain today. Still on norepinephrine drip.  Afebrile overnight  Objective: Vital signs in last 24 hours: Temp:  [98 F (36.7 C)-99.4 F (37.4 C)] 99.4 F (37.4 C) (06/27 0800) Pulse Rate:  [82-95] 82 (06/27 0645) Resp:  [20-37] 20 (06/27 0645) BP: (86-114)/(55-77) 97/61 (06/27 0645) SpO2:  [93 %-99 %] 95 % (06/27 0645) Last BM Date: 12/01/20  Intake/Output from previous day: 06/26 0701 - 06/27 0700 In: 1994.9 [I.V.:1117.8; IV Piggyback:877.1] Out: 1510 [Urine:1500; Blood:10] Intake/Output this shift: Total I/O In: 20 [I.V.:20] Out: -   PE: Gen: Awake and alert, pleasant Lungs: Normal rate and effort Heart: Reg Wounds: Seen with RN x 2 LLQ incision measures 3 cm.  There is mixed fatty and healthy granulation tissue at the base.  No drainage.  This connects with infraumbilical incision that measures 8 cm.  There is mixed fatty and healthy granulation tissue at the base.  There is no drainage.  This connects with RLQ incision that is 2.5cm with mixed fatty healthy granulation tissue at the base.  No drainage.  Suprapubic wound measures 8 cm with 5 cm of anterior tracking.  Wound as noted below.  No drainage.  Periwound is without evidence of cellulitis. Right perineum incision as noted below.  Wound measures 15cm. Patient had some diarrhea that needed to be cleaned. There is some thin gray drainage from the posterior aspect of the wound.  There is mixed fatty and slough like tissue.  Some granulation tissue mixed in.  Periwound with faint erythema. Scrotal edema noted. Foley in place.     Left abdomen  Suprapubic  Infraumbilical  Right abdomen    Lab Results:  Recent Labs    12/01/20 0543 12/02/20 0353  WBC 21.6* 15.8*  HGB 9.1* 8.2*  HCT 27.9* 25.5*  PLT 284 288   BMET Recent Labs    12/01/20 0543 12/01/20 0800 12/02/20 0353  NA 137 136  --   K 3.6 3.5  --   CL 108 107  --   CO2 22 22  --    GLUCOSE 182* 177*  --   BUN 9 9  --   CREATININE 0.61 0.53* 0.58*  CALCIUM 8.2* 8.1*  --    PT/INR No results for input(s): LABPROT, INR in the last 72 hours. CMP     Component Value Date/Time   NA 136 12/01/2020 0800   K 3.5 12/01/2020 0800   CL 107 12/01/2020 0800   CO2 22 12/01/2020 0800   GLUCOSE 177 (H) 12/01/2020 0800   BUN 9 12/01/2020 0800   CREATININE 0.58 (L) 12/02/2020 0353   CALCIUM 8.1 (L) 12/01/2020 0800   PROT 8.0 11/29/2020 2302   ALBUMIN 3.2 (L) 11/29/2020 2302   AST 20 11/29/2020 2302   ALT 23 11/29/2020 2302   ALKPHOS 116 11/29/2020 2302   BILITOT 1.1 11/29/2020 2302   GFRNONAA >60 12/02/2020 0353   Lipase     Component Value Date/Time   LIPASE 22 11/03/2020 1714       Studies/Results: No results found.  Anti-infectives: Anti-infectives (From admission, onward)    Start     Dose/Rate Route Frequency Ordered Stop   11/30/20 1500  vancomycin (VANCOREADY) IVPB 1250 mg/250 mL        1,250 mg 166.7 mL/hr over 90 Minutes Intravenous Every 12 hours 11/30/20 0606     11/30/20 0600  piperacillin-tazobactam (ZOSYN) IVPB 3.375 g  3.375 g 12.5 mL/hr over 240 Minutes Intravenous Every 8 hours 11/30/20 0237     11/30/20 0403  piperacillin-tazobactam (ZOSYN) 3.375 GM/50ML IVPB       Note to Pharmacy: Hedy Camara   : cabinet override      11/30/20 0403 12/01/20 0206   11/30/20 0230  clindamycin (CLEOCIN) IVPB 600 mg  Status:  Discontinued        600 mg 100 mL/hr over 30 Minutes Intravenous  Once 11/30/20 0219 11/30/20 0237   11/30/20 0000  ceFEPIme (MAXIPIME) 2 g in sodium chloride 0.9 % 100 mL IVPB        2 g 200 mL/hr over 30 Minutes Intravenous  Once 11/29/20 2347 11/30/20 0033   11/30/20 0000  vancomycin (VANCOREADY) IVPB 1500 mg/300 mL        1,500 mg 150 mL/hr over 120 Minutes Intravenous  Once 11/29/20 2347 11/30/20 0300   11/29/20 2345  vancomycin (VANCOCIN) IVPB 1000 mg/200 mL premix  Status:  Discontinued        1,000 mg 200 mL/hr  over 60 Minutes Intravenous  Once 11/29/20 2340 11/29/20 2347   11/29/20 2345  ceFEPIme (MAXIPIME) 1 g in sodium chloride 0.9 % 100 mL IVPB  Status:  Discontinued        1 g 200 mL/hr over 30 Minutes Intravenous  Once 11/29/20 2340 11/29/20 2347        Assessment/Plan POD 2 s/p EUA, rectal wall bx, I&D of perirectal, scrotal, penile and abdominal regions for Fournier's gangrene - Dr. Johney Maine w/ Dr. Alinda Money - 11/30/20 POD 1 s/p I&D of perirectal, scrotal, penile and abdominal regions for Fournier's gangrene, rectal stricturing/possible cancer - Dr. Johney Maine and Dr. Alinda Money - 12/01/20 - Reviewed with attending. Suspect patient will need to go to the OR tomorrow for another look and further debridement.  - BID WTD - Cont abx  - Await bx - Cont foley per urology - Per Dr. Johney Maine note may need fecal diversion if does not improve - Appreciate CCM assistance  FEN - okay for diet. NPO at midnight.  VTE - okay for chemical prophylaxis  ID - Vanc and Zosyn. WBC 15.8 from 21.6 Foley - in place    LOS: 2 days    James Coffey , [facility] Surgery 12/02/2020, 10:30 AM Please see Amion for pager number during day hours 7:00am-4:30pm

## 2020-12-02 NOTE — Progress Notes (Signed)
PHARMACY - PHYSICIAN COMMUNICATION CRITICAL VALUE ALERT - BLOOD CULTURE IDENTIFICATION (BCID)  James Coffey is an 50 y.o. male who presented to [facility] on 11/29/2020 with a chief complaint of fournier's gangrene  Assessment:  1 of 4 bottles from blood cultures growing gram variable rods, nothing per BCID  Name of physician (or Provider) Contacted: n/a  Current antibiotics: Zosyn, Zyvox  Changes to prescribed antibiotics recommended:  No changes to abx's  Results for orders placed or performed during the hospital encounter of 11/29/20  Blood Culture ID Panel (Reflexed) (Collected: 11/29/2020 11:42 PM)  Result Value Ref Range   Enterococcus faecalis NOT DETECTED NOT DETECTED   Enterococcus Faecium NOT DETECTED NOT DETECTED   Listeria monocytogenes NOT DETECTED NOT DETECTED   Staphylococcus species NOT DETECTED NOT DETECTED   Staphylococcus aureus (BCID) NOT DETECTED NOT DETECTED   Staphylococcus epidermidis NOT DETECTED NOT DETECTED   Staphylococcus lugdunensis NOT DETECTED NOT DETECTED   Streptococcus species NOT DETECTED NOT DETECTED   Streptococcus agalactiae NOT DETECTED NOT DETECTED   Streptococcus pneumoniae NOT DETECTED NOT DETECTED   Streptococcus pyogenes NOT DETECTED NOT DETECTED   A.calcoaceticus-baumannii NOT DETECTED NOT DETECTED   Bacteroides fragilis NOT DETECTED NOT DETECTED   Enterobacterales NOT DETECTED NOT DETECTED   Enterobacter cloacae complex NOT DETECTED NOT DETECTED   Escherichia coli NOT DETECTED NOT DETECTED   Klebsiella aerogenes NOT DETECTED NOT DETECTED   Klebsiella oxytoca NOT DETECTED NOT DETECTED   Klebsiella pneumoniae NOT DETECTED NOT DETECTED   Proteus species NOT DETECTED NOT DETECTED   Salmonella species NOT DETECTED NOT DETECTED   Serratia marcescens NOT DETECTED NOT DETECTED   Haemophilus influenzae NOT DETECTED NOT DETECTED   Neisseria meningitidis NOT DETECTED NOT DETECTED   Pseudomonas aeruginosa NOT DETECTED NOT DETECTED    Stenotrophomonas maltophilia NOT DETECTED NOT DETECTED   Candida albicans NOT DETECTED NOT DETECTED   Candida auris NOT DETECTED NOT DETECTED   Candida glabrata NOT DETECTED NOT DETECTED   Candida krusei NOT DETECTED NOT DETECTED   Candida parapsilosis NOT DETECTED NOT DETECTED   Candida tropicalis NOT DETECTED NOT DETECTED   Cryptococcus neoformans/gattii NOT DETECTED NOT DETECTED    Kara Mead 12/02/2020  2:39 PM

## 2020-12-02 NOTE — Progress Notes (Signed)
Patient ID: James Coffey, male   DOB: 10-13-1970, 50 y.o.   MRN: [id number]  1 Day Post-Op Subjective: Pt feeling better.  Less pain.  Still on norepinephrine drip this morning.  Objective: Vital signs in last 24 hours: Temp:  [98 F (36.7 C)-99.4 F (37.4 C)] 99.4 F (37.4 C) (06/27 0800) Pulse Rate:  [82-95] 82 (06/27 0645) Resp:  [20-37] 20 (06/27 0645) BP: (86-114)/(55-77) 97/61 (06/27 0645) SpO2:  [93 %-99 %] 95 % (06/27 0645)  Intake/Output from previous day: 06/26 0701 - 06/27 0700 In: 1994.9 [I.V.:1117.8; IV Piggyback:877.1] Out: 1510 [Urine:1500; Blood:10] Intake/Output this shift: No intake/output data recorded.  Physical Exam:  General: Alert and oriented GU: Penile and scrotal skin remains viable with less erythema, still with edema as expected.  Urethral catheter in place.  Lab Results: Recent Labs    11/29/20 2302 12/01/20 0543 12/02/20 0353  HGB 12.8* 9.1* 8.2*  HCT 38.8* 27.9* 25.5*   CBC Latest Ref Rng & Units 12/02/2020 12/01/2020 11/29/2020  WBC 4.0 - 10.5 K/uL 15.8(H) 21.6(H) 30.9(H)  Hemoglobin 13.0 - 17.0 g/dL 8.2(L) 9.1(L) 12.8(L)  Hematocrit 39.0 - 52.0 % 25.5(L) 27.9(L) 38.8(L)  Platelets 150 - 400 K/uL 288 284 348      BMET Recent Labs    12/01/20 0543 12/01/20 0800 12/02/20 0353  NA 137 136  --   K 3.6 3.5  --   CL 108 107  --   CO2 22 22  --   GLUCOSE 182* 177*  --   BUN 9 9  --   CREATININE 0.61 0.53* 0.58*  CALCIUM 8.2* 8.1*  --      Studies/Results: Cultures: Polymicrobial growth with final culture results pending.  Assessment/Plan: 1) Fournier's gangrene with necrotizing infection of perirectal/perineal/scrotal/penile/abdominal tissues: Clinically improving.  Continue broad spectrum IV antibiotics and supportive care.  Will be available to re-examine later this week pending decision by General Surgery to take back to OR.  Currently, GU skin and structures remain viable.  Continue urethral catheter.   LOS: 2 days   Dutch Gray 12/02/2020, 8:49 AM

## 2020-12-03 DIAGNOSIS — K611 Rectal abscess: Secondary | ICD-10-CM

## 2020-12-03 LAB — CBC
HCT: 24.2 % — ABNORMAL LOW (ref 39.0–52.0)
Hemoglobin: 7.5 g/dL — ABNORMAL LOW (ref 13.0–17.0)
MCH: 27.7 pg (ref 26.0–34.0)
MCHC: 31 g/dL (ref 30.0–36.0)
MCV: 89.3 fL (ref 80.0–100.0)
Platelets: 374 10*3/uL (ref 150–400)
RBC: 2.71 MIL/uL — ABNORMAL LOW (ref 4.22–5.81)
RDW: 14.3 % (ref 11.5–15.5)
WBC: 13.9 10*3/uL — ABNORMAL HIGH (ref 4.0–10.5)
nRBC: 0 % (ref 0.0–0.2)

## 2020-12-03 LAB — PHOSPHORUS
Phosphorus: 1.1 mg/dL — ABNORMAL LOW (ref 2.5–4.6)
Phosphorus: 2.4 mg/dL — ABNORMAL LOW (ref 2.5–4.6)

## 2020-12-03 LAB — BASIC METABOLIC PANEL
Anion gap: 3 — ABNORMAL LOW (ref 5–15)
BUN: 14 mg/dL (ref 6–20)
CO2: 24 mmol/L (ref 22–32)
Calcium: 6.6 mg/dL — ABNORMAL LOW (ref 8.9–10.3)
Chloride: 112 mmol/L — ABNORMAL HIGH (ref 98–111)
Creatinine, Ser: 0.52 mg/dL — ABNORMAL LOW (ref 0.61–1.24)
GFR, Estimated: >60 mL/min (ref >60)
Glucose, Bld: 121 mg/dL — ABNORMAL HIGH (ref 70–99)
Potassium: 2.7 mmol/L — CL (ref 3.5–5.1)
Sodium: 139 mmol/L (ref 135–145)

## 2020-12-03 LAB — GLUCOSE, CAPILLARY
Glucose-Capillary: 139 mg/dL — ABNORMAL HIGH (ref 70–99)
Glucose-Capillary: 155 mg/dL — ABNORMAL HIGH (ref 70–99)
Glucose-Capillary: 127 mg/dL — ABNORMAL HIGH (ref 70–99)
Glucose-Capillary: 157 mg/dL — ABNORMAL HIGH (ref 70–99)

## 2020-12-03 LAB — MAGNESIUM: Magnesium: 1.7 mg/dL (ref 1.7–2.4)

## 2020-12-03 MED ORDER — PROSOURCE PLUS PO LIQD
30.0000 mL | Freq: Two times a day (BID) | ORAL | Status: DC
  Administered 2020-12-04 – 2020-12-19 (×29): 30 mL via ORAL
  Filled 2020-12-03 (×29): qty 30

## 2020-12-03 MED ORDER — ADULT MULTIVITAMIN W/MINERALS CH
1.0000 | ORAL_TABLET | Freq: Every day | ORAL | Status: DC
  Administered 2020-12-03 – 2020-12-19 (×17): 1 via ORAL
  Filled 2020-12-03 (×17): qty 1

## 2020-12-03 MED ORDER — POTASSIUM PHOSPHATES 15 MMOLE/5ML IV SOLN
45.0000 mmol | Freq: Once | INTRAVENOUS | Status: AC
  Administered 2020-12-03: 45 mmol via INTRAVENOUS
  Filled 2020-12-03: qty 15

## 2020-12-03 MED ORDER — ZINC SULFATE 220 (50 ZN) MG PO CAPS
220.0000 mg | ORAL_CAPSULE | Freq: Every day | ORAL | Status: AC
  Administered 2020-12-03 – 2020-12-17 (×15): 220 mg via ORAL
  Filled 2020-12-03 (×14): qty 1

## 2020-12-03 MED ORDER — ENSURE ENLIVE PO LIQD
237.0000 mL | Freq: Two times a day (BID) | ORAL | Status: DC
  Administered 2020-12-03 – 2020-12-19 (×19): 237 mL via ORAL

## 2020-12-03 MED ORDER — POTASSIUM CHLORIDE 10 MEQ/50ML IV SOLN
10.0000 meq | INTRAVENOUS | Status: AC
Start: 2020-12-03 — End: 2020-12-03
  Administered 2020-12-03 (×2): 10 meq via INTRAVENOUS
  Filled 2020-12-03 (×2): qty 50

## 2020-12-03 MED ORDER — JUVEN PO PACK
1.0000 | PACK | Freq: Two times a day (BID) | ORAL | Status: DC
  Administered 2020-12-03 – 2020-12-19 (×31): 1 via ORAL
  Filled 2020-12-03 (×34): qty 1

## 2020-12-03 MED ORDER — MAGNESIUM SULFATE 2 GM/50ML IV SOLN
2.0000 g | Freq: Once | INTRAVENOUS | Status: AC
  Administered 2020-12-03: 2 g via INTRAVENOUS
  Filled 2020-12-03: qty 50

## 2020-12-03 MED ORDER — POTASSIUM & SODIUM PHOSPHATES 280-160-250 MG PO PACK
2.0000 | PACK | Freq: Once | ORAL | Status: AC
  Administered 2020-12-03: 2 via ORAL
  Filled 2020-12-03: qty 2

## 2020-12-03 MED ORDER — ASCORBIC ACID 500 MG PO TABS
500.0000 mg | ORAL_TABLET | Freq: Two times a day (BID) | ORAL | Status: DC
  Administered 2020-12-03 – 2020-12-19 (×32): 500 mg via ORAL
  Filled 2020-12-03 (×33): qty 1

## 2020-12-03 NOTE — H&P (View-Only) (Signed)
2 Days Post-Op  Subjective: CC: Patient feeling much better today. Less pain. Up on Levo today  Objective: Vital signs in last 24 hours: Temp:  [98.6 F (37 C)-100.1 F (37.8 C)] 98.8 F (37.1 C) (06/28 0800) Pulse Rate:  [74-93] 93 (06/28 1030) Resp:  [10-24] 22 (06/28 1030) BP: (77-129)/(41-114) 106/66 (06/28 1000) SpO2:  [95 %-100 %] 96 % (06/28 1030) Last BM Date: 12/02/20  Intake/Output from previous day: 06/27 0701 - 06/28 0700 In: 1060.3 [P.O.:480; I.V.:212.2; IV Piggyback:368.1] Out: 1100 [Urine:1100] Intake/Output this shift: Total I/O In: 722.8 [I.V.:64; IV Piggyback:658.8] Out: -   PE: Gen: Awake and alert, pleasant Lungs: Normal rate and effort Heart: Reg Wounds: Seen with RN x 2 LLQ incision measures 3 cm.  There is mixed fatty and healthy granulation tissue at the base.  No drainage. Stable from yesterday. This connects with infraumbilical incision that measures 8 cm.  There is mixed fatty and healthy granulation tissue at the base.  There is no drainage. This appears stable from yesterday. This connects with RLQ incision that is 2.5cm with mixed fatty healthy granulation tissue at the base.  No drainage. This appears stable from yesterday. Suprapubic wound measures 8 cm with 5 cm of anterior tracking.  Wound as noted below.  No drainage. This appears stable from yesterday. Periwound is without evidence of cellulitis. Right perineum incision as noted in picture below.  Wound measures 15cm. Patient had some diarrhea that needed to be cleaned. There is still some thin gray drainage from the posterior aspect of the wound but less then yesterday.  More pink granulation tissue but still majority fatty and fibropurulent like tissue.  Periwound with faint erythema. Scrotal edema noted. Foley in place.      Lab Results:  Recent Labs    12/02/20 0353 12/03/20 0507  WBC 15.8* 13.9*  HGB 8.2* 7.5*  HCT 25.5* 24.2*  PLT 288 374   BMET Recent Labs     12/01/20 0800 12/02/20 0353 12/03/20 0507  NA 136  --  139  K 3.5  --  2.7*  CL 107  --  112*  CO2 22  --  24  GLUCOSE 177*  --  121*  BUN 9  --  14  CREATININE 0.53* 0.58* 0.52*  CALCIUM 8.1*  --  6.6*   PT/INR No results for input(s): LABPROT, INR in the last 72 hours. CMP     Component Value Date/Time   NA 139 12/03/2020 0507   K 2.7 (LL) 12/03/2020 0507   CL 112 (H) 12/03/2020 0507   CO2 24 12/03/2020 0507   GLUCOSE 121 (H) 12/03/2020 0507   BUN 14 12/03/2020 0507   CREATININE 0.52 (L) 12/03/2020 0507   CALCIUM 6.6 (L) 12/03/2020 0507   PROT 8.0 11/29/2020 2302   ALBUMIN 3.2 (L) 11/29/2020 2302   AST 20 11/29/2020 2302   ALT 23 11/29/2020 2302   ALKPHOS 116 11/29/2020 2302   BILITOT 1.1 11/29/2020 2302   GFRNONAA >60 12/03/2020 0507   Lipase     Component Value Date/Time   LIPASE 22 11/03/2020 1714       Studies/Results: No results found.  Anti-infectives: Anti-infectives (From admission, onward)    Start     Dose/Rate Route Frequency Ordered Stop   12/02/20 1400  linezolid (ZYVOX) IVPB 600 mg        600 mg 300 mL/hr over 60 Minutes Intravenous Every 12 hours 12/02/20 1138     11/30/20 1500  vancomycin (  VANCOREADY) IVPB 1250 mg/250 mL  Status:  Discontinued        1,250 mg 166.7 mL/hr over 90 Minutes Intravenous Every 12 hours 11/30/20 0606 12/02/20 1138   11/30/20 0600  piperacillin-tazobactam (ZOSYN) IVPB 3.375 g        3.375 g 12.5 mL/hr over 240 Minutes Intravenous Every 8 hours 11/30/20 0237     11/30/20 0403  piperacillin-tazobactam (ZOSYN) 3.375 GM/50ML IVPB       Note to Pharmacy: Hedy Camara   : cabinet override      11/30/20 0403 12/01/20 0206   11/30/20 0230  clindamycin (CLEOCIN) IVPB 600 mg  Status:  Discontinued        600 mg 100 mL/hr over 30 Minutes Intravenous  Once 11/30/20 0219 11/30/20 0237   11/30/20 0000  ceFEPIme (MAXIPIME) 2 g in sodium chloride 0.9 % 100 mL IVPB        2 g 200 mL/hr over 30 Minutes Intravenous  Once  11/29/20 2347 11/30/20 0033   11/30/20 0000  vancomycin (VANCOREADY) IVPB 1500 mg/300 mL        1,500 mg 150 mL/hr over 120 Minutes Intravenous  Once 11/29/20 2347 11/30/20 0300   11/29/20 2345  vancomycin (VANCOCIN) IVPB 1000 mg/200 mL premix  Status:  Discontinued        1,000 mg 200 mL/hr over 60 Minutes Intravenous  Once 11/29/20 2340 11/29/20 2347   11/29/20 2345  ceFEPIme (MAXIPIME) 1 g in sodium chloride 0.9 % 100 mL IVPB  Status:  Discontinued        1 g 200 mL/hr over 30 Minutes Intravenous  Once 11/29/20 2340 11/29/20 2347        Assessment/Plan POD 3 s/p EUA, rectal wall bx, I&D of perirectal, scrotal, penile and abdominal regions for Fournier's gangrene - Dr. Johney Maine w/ Dr. Alinda Money - 11/30/20 POD 2 s/p I&D of perirectal, scrotal, penile and abdominal regions for Fournier's gangrene, rectal stricturing/possible cancer - Dr. Johney Maine and Dr. Alinda Money - 12/01/20 - Originally planned for OR today. Will plan to take back tomorrow. Discussed with Urology - BID WTD - Cont abx - Await bx - Cont foley per urology - Per Dr. Johney Maine note may need fecal diversion if does not improve - he continues to have diarrhea. - Appreciate CCM assistance   FEN - Okay for diet. NPO at midnight.  VTE - okay for chemical prophylaxis  ID - Linezolid and Zosyn. WBC 21.6 > 15.8 > 13.9 Foley - in place   LOS: 3 days    Jillyn Ledger , [facility] Surgery 12/03/2020, 11:55 AM Please see Amion for pager number during day hours 7:00am-4:30pm

## 2020-12-03 NOTE — Progress Notes (Signed)
Phos 1.1, K+ 2.7, Mg 1.7 Replaced per protocol

## 2020-12-03 NOTE — Anesthesia Preprocedure Evaluation (Addendum)
Anesthesia Evaluation  Patient identified by MRN, date of birth, ID band Patient awake    Reviewed: Allergy & Precautions, NPO status , Patient's Chart, lab work & pertinent test results  History of Anesthesia Complications Negative for: history of anesthetic complications  Airway Mallampati: I  TM Distance: >3 FB Neck ROM: Full    Dental  (+) Edentulous Lower, Dental Advisory Given, Edentulous Upper,    Pulmonary Current Smoker,    Pulmonary exam normal        Cardiovascular negative cardio ROS Normal cardiovascular exam     Neuro/Psych negative neurological ROS  negative psych ROS   GI/Hepatic negative GI ROS, Neg liver ROS,   Endo/Other  negative endocrine ROS  Renal/GU negative Renal ROS     Musculoskeletal   Abdominal   Peds  Hematology   Anesthesia Other Findings   Reproductive/Obstetrics                           Anesthesia Physical  Anesthesia Plan  ASA: 3  Anesthesia Plan: General   Post-op Pain Management:    Induction: Intravenous  PONV Risk Score and Plan: 2 and Ondansetron and Midazolam  Airway Management Planned: Oral ETT and LMA  Additional Equipment: None  Intra-op Plan:   Post-operative Plan: Extubation in OR  Informed Consent: I have reviewed the patients History and Physical, chart, labs and discussed the procedure including the risks, benefits and alternatives for the proposed anesthesia with the patient or authorized representative who has indicated his/her understanding and acceptance.     Dental advisory given  Plan Discussed with: Anesthesiologist and CRNA  Anesthesia Plan Comments:        Anesthesia Quick Evaluation

## 2020-12-03 NOTE — Progress Notes (Signed)
NAMEJordon Coffey, MRN:  [id number], DOB:  04-26-1971, LOS: 3 ADMISSION DATE:  11/29/2020, CONSULTATION DATE:  11/30/20 REFERRING MD:  Dr Georgette Dover, CHIEF COMPLAINT: Septic shock secondary to Fournier's gangrene  History of Present Illness:   James Coffey was admitted with Fournier's gangrene Post I&D and debridement Following surgery has remained hypotensive, on dual BP meds Consult was called to assist with management of septic shock  He was treated about a month ago for a perirectal abscess-improved with antibiotics Started having pain and discomfort, urinary frequency, frequent bowel motions Came in with very tender scrotum and CT revealed gas at the base of the scrotum and base of penis Tachycardic, hypotensive Smoker   Pertinent  Medical History  History reviewed. No pertinent past medical history.    Significant Hospital Events: Including procedures, antibiotic start and stop dates in addition to other pertinent events   I&D and debridement 6/25 Central line placement 6/25 6/26 Dr. Jaymes Graff for review and further debridement 6/28 Hypokalemic overnight, Levophed slightly increased to 70mcg  Cefepime 6/24 Zosyn 6/24>> Vancomycin 6/24>>  Interim History / Subjective:  Awake, pain controlled, remains on Levophed likely back to the OR on 629   Objective   Blood pressure (!) 93/50, pulse 81, temperature 98.8 F (37.1 C), temperature source Oral, resp. rate (!) 22, weight 75.1 kg, SpO2 97 %.        Intake/Output Summary (Last 24 hours) at 12/03/2020 0856 Last data filed at 12/03/2020 3154 Gross per 24 hour  Intake 1060.28 ml  Output 1100 ml  Net -39.72 ml    Filed Weights   11/30/20 0800  Weight: 75.1 kg   General: Well-nourished male, awake, resting in bed, no distress HEENT: MM pink/moist Neuro: Alert and oriented, no focal deficits CV: s1s2 RRR, no m/r/g PULM: Lungs clear bilaterally, no retractions or tachypnea GI: soft, bsx4 active, covered incisions Extremities:  warm/dry, no edema  Skin: no rashes or lesions   Labs/imaging that I have personally reviewed  (right click and "Reselect all SmartList Selections" daily)  K2.7, hemoglobin 0.5, white blood cell count 13.9  Resolved Hospital Problem list   AKI  Assessment & Plan:    Septic shock secondary to Fournier's Gangrene of the perirectal, scrotal, penile and abdominal regions S/p incision and drainage surgical debridement on 6/25 and 6/26 Surgery and urology following White blood cell count beginning to downtrend P: -Plan per surgery and urology -General surgery may return to the OR for repeat debridement tomorrow 6/29 -Continue vancomycin, Zosyn -Continue Levophed to maintain MAP>     Tobacco use disorder -Nicotine patch -Discussed cessation when appropriate  Normocytic anemia Hemoglobin slowly downtrending from 12.8 four days ago to 7.5 today, likely secondary to ABLA and anemia of critical illness P: -Trend, may need transfusion for hemoglobin<7  Best Practice (right click and "Reselect all SmartList Selections" daily)   Diet/type: Regular consistency (see orders) Pain/Anxiety/Delirium protocol Yes VAP protocol (if indicated): Not indicated DVT prophylaxis: prophylactic heparin  GI prophylaxis: N/A Glucose control:  not indicated Central venous access:  Yes, and it is still needed Arterial line:  N/A Foley:  Yes, and it is still needed Mobility:  bed rest  PT consulted: N/A Studies pending: None Culture data pending:blood Last reviewed culture data:today Antibiotics:zosyn and vanc Antibiotic de-escalation: no,  continue current rx Stop date: to be determined  Code Status:  full code Last date of multidisciplinary goals of care discussion [pending] ccm prognosis: Life-threating and Serious Disposition: remains critically ill, will stay in intensive  care   Labs   CBC: Recent Labs  Lab 11/29/20 2302 12/01/20 0543 12/02/20 0353 12/03/20 0507  WBC 30.9* 21.6*  15.8* 13.9*  NEUTROABS  --  18.7*  --   --   HGB 12.8* 9.1* 8.2* 7.5*  HCT 38.8* 27.9* 25.5* 24.2*  MCV 87.8 88.9 87.6 89.3  PLT 348 284 288 374     Basic Metabolic Panel: Recent Labs  Lab 11/29/20 2302 11/30/20 1237 12/01/20 0543 12/01/20 0800 12/02/20 0353 12/03/20 0507  NA 130* 139 137 136  --  139  K 4.0 3.7 3.6 3.5  --  2.7*  CL 94* 109 108 107  --  112*  CO2 23 21* 22 22  --  24  GLUCOSE 166* 160* 182* 177*  --  121*  BUN 15 11 9 9   --  14  CREATININE 1.34* 0.70 0.61 0.53* 0.58* 0.52*  CALCIUM 9.2 8.0* 8.2* 8.1*  --  6.6*  MG  --   --  1.9  --   --  1.7  PHOS  --   --   --   --   --  1.1*    GFR: Estimated Creatinine Clearance: 116 mL/min (A) (by C-G formula based on SCr of 0.52 mg/dL (L)). Recent Labs  Lab 11/29/20 2302 11/30/20 0102 11/30/20 1200 12/01/20 0543 12/02/20 0353 12/03/20 0507  WBC 30.9*  --   --  21.6* 15.8* 13.9*  LATICACIDVEN 3.2* 4.1* 2.7*  --   --   --      Liver Function Tests: Recent Labs  Lab 11/29/20 2302  AST 20  ALT 23  ALKPHOS 116  BILITOT 1.1  PROT 8.0  ALBUMIN 3.2*    No results for input(s): LIPASE, AMYLASE in the last 168 hours. No results for input(s): AMMONIA in the last 168 hours.  ABG No results found for: PHART, PCO2ART, PO2ART, HCO3, TCO2, ACIDBASEDEF, O2SAT   Coagulation Profile: No results for input(s): INR, PROTIME in the last 168 hours.  Cardiac Enzymes: No results for input(s): CKTOTAL, CKMB, CKMBINDEX, TROPONINI in the last 168 hours.  HbA1C: Hgb A1c MFr Bld  Date/Time Value Ref Range Status  11/04/2020 03:30 AM 5.6 4.8 - 5.6 % Final    Comment:    (NOTE)         Prediabetes: 5.7 - 6.4         Diabetes: >6.4         Glycemic control for adults with diabetes: <7.0     CBG: Recent Labs  Lab 12/02/20 1744 12/02/20 2132 12/03/20 0742  GLUCAP 166* 184* 139*    Review of Systems:   Admits to pain and discomfort around surgical site  Past Medical History:  He,  has no past medical  history on file.   Surgical History:   Past Surgical History:  Procedure Laterality Date   HERNIA REPAIR     INCISION AND DRAINAGE ABSCESS N/A 12/01/2020   Procedure: INCISION AND DRAINAGE ABSCESS;  Surgeon: Michael Boston, MD;  Location: [facility];  Service: General;  Laterality: N/A;   INCISION AND DRAINAGE PERIRECTAL ABSCESS N/A 11/30/2020   Procedure: INCISION AND DEBRIDEMENT OF PERIRECTAL ABSCESS AND RECTAL WALL BIOPSIES;  Surgeon: Michael Boston, MD;  Location: [facility];  Service: General;  Laterality: N/A;   INGUINAL HERNIA REPAIR Right 2007     Social History:   reports that he has been smoking cigarettes. He has been smoking an average of 1.00 packs per day. He has never used smokeless  tobacco. He reports that he does not drink alcohol and does not use drugs.   Family History:  His family history is negative for CAD and Inflammatory bowel disease.   Allergies No Known Allergies   Home Medications  Prior to Admission medications   Medication Sig Start Date End Date Taking? Authorizing Provider  acetaminophen (TYLENOL) 325 MG tablet Take 2 tablets (650 mg total) by mouth every 6 (six) hours as needed for mild pain (or Fever >/= 101). 11/05/20   Domenic Polite, MD  ibuprofen (ADVIL) 200 MG tablet Take 200 mg by mouth every 6 (six) hours as needed for mild pain.    [provider]   CRITICAL CARE Performed by: Otilio Carpen Lonnell Chaput   Total critical care time: 30 minutes  Critical care time was exclusive of separately billable procedures and treating other patients.  Critical care was necessary to treat or prevent imminent or life-threatening deterioration.  Critical care was time spent personally by me on the following activities: development of treatment plan with patient and/or surrogate as well as nursing, discussions with consultants, evaluation of patient's response to treatment, examination of patient, obtaining history from patient or surrogate, ordering and performing  treatments and interventions, ordering and review of laboratory studies, ordering and review of radiographic studies, pulse oximetry and re-evaluation of patient's condition.   Otilio Carpen Dareld Mcauliffe, PA-C [facility] See Amion for pager If no response to pager , please call [phone] until 7pm After 7:00 pm call Elink  [phone]

## 2020-12-03 NOTE — Progress Notes (Signed)
Initial Nutrition Assessment  DOCUMENTATION CODES:   Non-severe (moderate) malnutrition in context of chronic illness  INTERVENTION:  - will order Ensure Enlive BID, each supplement provides 350 kcal and 20 grams of protein. -will order Juven BID, each packet provides 95 calories, 2.5 grams of protein (collagen), and 9.8 grams of carbohydrate (3 grams sugar); also contains 7 grams of L-arginine and L-glutamine, 300 mg vitamin C, 15 mg vitamin E, 1.2 mcg vitamin B-12, 9.5 mg zinc, 200 mg calcium, and 1.5 g  Calcium Beta-hydroxy-Beta-methylbutyrate to support wound healing. - will order 30 ml Prosource Plus BID, each supplement provides 100 kcal and 15 grams protein.  - will order 1 tablet multivitamin with minerals/day. - will order 500 mg ascorbic acid BID, - will order 220 mg zinc sulfate/day. - consider ordering serum vitamin D and serum vitamin A.    - if patient unable to meet nutrition needs orally, may need to consider small bore NGT placement with supplemental nocturnal TF.   NUTRITION DIAGNOSIS:   Moderate Malnutrition related to chronic illness as evidenced by mild fat depletion, mild muscle depletion, moderate muscle depletion.  GOAL:   Patient will meet greater than or equal to 90% of their needs  MONITOR:   PO intake, Supplement acceptance, Labs, Weight trends, Skin  REASON FOR ASSESSMENT:   Malnutrition Screening Tool  ASSESSMENT:   50 year-old male with no known medical history. He presented to the hospital ~1 month ago and was noted to have a spontaneously draining perirectal abscess. He presented to the ED on 6/25 d/t worsening pain, chills, increased urinary frequency, and irregular bowel urgency. He was dx with Fournier's gangrene/necrotizing fasciitis.  Patient has been NPO multiple times this admission, also on CLD and Regular diet. He is currently ordered a Regular diet and will be made NPO at midnight pending return to the OR on 6/29.  Most recent meal  intakes documented were 100% of lunch and 100% of dinner on 6/27 (total of 1183 kcal and 33 grams protein).  Patient was discussed in rounds this AM.  Patient laying in bed with family member at bedside at the time of RD visit. Patient reports good appetite, no abdominal discomfort when eating, and no difficulties chewing or swallowing. He reports appetite is at baseline. He reports mainly consuming items such as salad, fish, and soup since admission.   Talked with patient about the role of nutrition with current medical course and wound healing. Discussed the importance of increased protein and micronutrients. Patient reports he has been drinking apple juice and orange juice. Talked with him about oral nutrition supplements outlined above and patient is open to trying them and understands the importance of them in ongoing healing.   He is POD #3 EUA, rectal wall biopsy, I&D of perirectal, scrotal, penile, and abdominal regions d/t Fournier's gangrene.  He is POD #2 I&D of perirectal, scrotal, penile, and abdominal regions d/t Fournier's gangrene and rectal stricturing/possible cancer.  Weight on 6/25 was 165.5 lb and weigh ton 11/05/20 was 153 lb. He is noted to be +4.6 L since admission.    Labs reviewed; CBGs: 139 and 155 mg/dl, K: 2.7 mmol/l, Cl: 112 mmol/l, creatinine: 0.52 mg/dl, Ca: 6.6 mg/dl, Phos: 1.1 mg/dl.  Medications reviewed; 100 mg colace BID, sliding scale novolog, 2 g IV Mg sulfate x1 run 6/28, 17 g miralax BID, 10 mEq IV KCl x2 runs 6/28, 45 mmol IV KPhos x1 run 6/28. IVF; NS @ 150 ml/hr.     NUTRITION -  FOCUSED PHYSICAL EXAM:  Flowsheet Row Most Recent Value  Orbital Region Mild depletion  Thoracic and Lumbar Region Unable to assess  Buccal Region Mild depletion  Temple Region Mild depletion  Clavicle Bone Region Moderate depletion  Clavicle and Acromion Bone Region Moderate depletion  Scapular Bone Region Unable to assess  Dorsal Hand Moderate depletion  Patellar  Region Moderate depletion  Anterior Thigh Region Unable to assess  Posterior Calf Region Moderate depletion  Edema (RD Assessment) None  Hair Reviewed  Eyes Reviewed  Mouth Reviewed  Skin Reviewed  Nails Reviewed       Diet Order:   Diet Order             Diet NPO time specified Except for: Sips with Meds  Diet effective midnight           Diet regular Room service appropriate? Yes; Fluid consistency: Thin  Diet effective now                   EDUCATION NEEDS:   Education needs have been addressed  Skin:  Skin Assessment: Skin Integrity Issues: Skin Integrity Issues:: Incisions Incisions: abdomen (6/25 and 6/26); perineum (6/25 and 6/26)  Last BM:  6/28 (type 7 x3)  Height:   Ht Readings from Last 1 Encounters:  11/03/20 6' (1.829 m)    Weight:   Wt Readings from Last 1 Encounters:  11/30/20 75.1 kg      Estimated Nutritional Needs:  Kcal:  2550-2750 kcal Protein:  145-160 grams Fluid:  >/= 2.8 L/day     Jarome Matin, MS, RD, LDN, CNSC Inpatient Clinical Dietitian RD pager # available in [place]  After hours/weekend pager # available in [facility]

## 2020-12-03 NOTE — Progress Notes (Signed)
Patient ID: James Coffey, James Coffey   DOB: 1970-07-29, 50 y.o.   MRN: [id number]  2 Days Post-Op Subjective: Pt feeling better each day.  Pain controlled.  Still on norepinephrine.  Objective: Vital signs in last 24 hours: Temp:  [98.6 F (37 C)-100.1 F (37.8 C)] 99.8 F (37.7 C) (06/28 0300) Pulse Rate:  [74-104] 81 (06/28 0600) Resp:  [12-29] 22 (06/28 0130) BP: (77-129)/(41-114) 93/50 (06/28 0600) SpO2:  [94 %-97 %] 97 % (06/28 0600)  Intake/Output from previous day: 06/27 0701 - 06/28 0700 In: 1060.3 [P.O.:480; I.V.:212.2; IV Piggyback:368.1] Out: 1100 [Urine:1100] Intake/Output this shift: No intake/output data recorded.  Physical Exam:  General: Alert and oriented GU: Stable edema of scrotal and penile skin but non-tender with minimal erythema, no crepitus or drainage noted.  Foley in place.  Lab Results: Recent Labs    12/01/20 0543 12/02/20 0353 12/03/20 0507  HGB 9.1* 8.2* 7.5*  HCT 27.9* 25.5* 24.2*   CBC Latest Ref Rng & Units 12/03/2020 12/02/2020 12/01/2020  WBC 4.0 - 10.5 K/uL 13.9(H) 15.8(H) 21.6(H)  Hemoglobin 13.0 - 17.0 g/dL 7.5(L) 8.2(L) 9.1(L)  Hematocrit 39.0 - 52.0 % 24.2(L) 25.5(L) 27.9(L)  Platelets 150 - 400 K/uL 374 288 284     BMET Recent Labs    12/01/20 0800 12/02/20 0353 12/03/20 0507  NA 136  --  139  K 3.5  --  2.7*  CL 107  --  112*  CO2 22  --  24  GLUCOSE 177*  --  121*  BUN 9  --  14  CREATININE 0.53* 0.58* 0.52*  CALCIUM 8.1*  --  6.6*     Studies/Results: No results found.  Assessment/Plan: Necrotizing fasciitis: GU skin appears viable still.  Keep Foley indwelling.  Final culture results still pending but preliminary results suggest polymicrobial infection.  On broad spectrum antibiotic therapy.  I touched base with General Surgery this morning. They are likely to consider taking him to OR tomorrow for further evaluation/additional debridement as necessary.  I will be available to examine the patient at that time to  further evaluation scrotal/penile skin, etc.   LOS: 3 days   Dutch Gray 12/03/2020, 8:04 AM

## 2020-12-03 NOTE — Progress Notes (Signed)
2 Days Post-Op  Subjective: CC: Patient feeling much better today. Less pain. Up on Levo today  Objective: Vital signs in last 24 hours: Temp:  [98.6 F (37 C)-100.1 F (37.8 C)] 98.8 F (37.1 C) (06/28 0800) Pulse Rate:  [74-93] 93 (06/28 1030) Resp:  [10-24] 22 (06/28 1030) BP: (77-129)/(41-114) 106/66 (06/28 1000) SpO2:  [95 %-100 %] 96 % (06/28 1030) Last BM Date: 12/02/20  Intake/Output from previous day: 06/27 0701 - 06/28 0700 In: 1060.3 [P.O.:480; I.V.:212.2; IV Piggyback:368.1] Out: 1100 [Urine:1100] Intake/Output this shift: Total I/O In: 722.8 [I.V.:64; IV Piggyback:658.8] Out: -   PE: Gen: Awake and alert, pleasant Lungs: Normal rate and effort Heart: Reg Wounds: Seen with RN x 2 LLQ incision measures 3 cm.  There is mixed fatty and healthy granulation tissue at the base.  No drainage. Stable from yesterday. This connects with infraumbilical incision that measures 8 cm.  There is mixed fatty and healthy granulation tissue at the base.  There is no drainage. This appears stable from yesterday. This connects with RLQ incision that is 2.5cm with mixed fatty healthy granulation tissue at the base.  No drainage. This appears stable from yesterday. Suprapubic wound measures 8 cm with 5 cm of anterior tracking.  Wound as noted below.  No drainage. This appears stable from yesterday. Periwound is without evidence of cellulitis. Right perineum incision as noted in picture below.  Wound measures 15cm. Patient had some diarrhea that needed to be cleaned. There is still some thin gray drainage from the posterior aspect of the wound but less then yesterday.  More pink granulation tissue but still majority fatty and fibropurulent like tissue.  Periwound with faint erythema. Scrotal edema noted. Foley in place.      Lab Results:  Recent Labs    12/02/20 0353 12/03/20 0507  WBC 15.8* 13.9*  HGB 8.2* 7.5*  HCT 25.5* 24.2*  PLT 288 374   BMET Recent Labs     12/01/20 0800 12/02/20 0353 12/03/20 0507  NA 136  --  139  K 3.5  --  2.7*  CL 107  --  112*  CO2 22  --  24  GLUCOSE 177*  --  121*  BUN 9  --  14  CREATININE 0.53* 0.58* 0.52*  CALCIUM 8.1*  --  6.6*   PT/INR No results for input(s): LABPROT, INR in the last 72 hours. CMP     Component Value Date/Time   NA 139 12/03/2020 0507   K 2.7 (LL) 12/03/2020 0507   CL 112 (H) 12/03/2020 0507   CO2 24 12/03/2020 0507   GLUCOSE 121 (H) 12/03/2020 0507   BUN 14 12/03/2020 0507   CREATININE 0.52 (L) 12/03/2020 0507   CALCIUM 6.6 (L) 12/03/2020 0507   PROT 8.0 11/29/2020 2302   ALBUMIN 3.2 (L) 11/29/2020 2302   AST 20 11/29/2020 2302   ALT 23 11/29/2020 2302   ALKPHOS 116 11/29/2020 2302   BILITOT 1.1 11/29/2020 2302   GFRNONAA >60 12/03/2020 0507   Lipase     Component Value Date/Time   LIPASE 22 11/03/2020 1714       Studies/Results: No results found.  Anti-infectives: Anti-infectives (From admission, onward)    Start     Dose/Rate Route Frequency Ordered Stop   12/02/20 1400  linezolid (ZYVOX) IVPB 600 mg        600 mg 300 mL/hr over 60 Minutes Intravenous Every 12 hours 12/02/20 1138     11/30/20 1500  vancomycin (  VANCOREADY) IVPB 1250 mg/250 mL  Status:  Discontinued        1,250 mg 166.7 mL/hr over 90 Minutes Intravenous Every 12 hours 11/30/20 0606 12/02/20 1138   11/30/20 0600  piperacillin-tazobactam (ZOSYN) IVPB 3.375 g        3.375 g 12.5 mL/hr over 240 Minutes Intravenous Every 8 hours 11/30/20 0237     11/30/20 0403  piperacillin-tazobactam (ZOSYN) 3.375 GM/50ML IVPB       Note to Pharmacy: Hedy Camara   : cabinet override      11/30/20 0403 12/01/20 0206   11/30/20 0230  clindamycin (CLEOCIN) IVPB 600 mg  Status:  Discontinued        600 mg 100 mL/hr over 30 Minutes Intravenous  Once 11/30/20 0219 11/30/20 0237   11/30/20 0000  ceFEPIme (MAXIPIME) 2 g in sodium chloride 0.9 % 100 mL IVPB        2 g 200 mL/hr over 30 Minutes Intravenous  Once  11/29/20 2347 11/30/20 0033   11/30/20 0000  vancomycin (VANCOREADY) IVPB 1500 mg/300 mL        1,500 mg 150 mL/hr over 120 Minutes Intravenous  Once 11/29/20 2347 11/30/20 0300   11/29/20 2345  vancomycin (VANCOCIN) IVPB 1000 mg/200 mL premix  Status:  Discontinued        1,000 mg 200 mL/hr over 60 Minutes Intravenous  Once 11/29/20 2340 11/29/20 2347   11/29/20 2345  ceFEPIme (MAXIPIME) 1 g in sodium chloride 0.9 % 100 mL IVPB  Status:  Discontinued        1 g 200 mL/hr over 30 Minutes Intravenous  Once 11/29/20 2340 11/29/20 2347        Assessment/Plan POD 3 s/p EUA, rectal wall bx, I&D of perirectal, scrotal, penile and abdominal regions for Fournier's gangrene - Dr. Johney Maine w/ Dr. Alinda Money - 11/30/20 POD 2 s/p I&D of perirectal, scrotal, penile and abdominal regions for Fournier's gangrene, rectal stricturing/possible cancer - Dr. Johney Maine and Dr. Alinda Money - 12/01/20 - Originally planned for OR today. Will plan to take back tomorrow. Discussed with Urology - BID WTD - Cont abx - Await bx - Cont foley per urology - Per Dr. Johney Maine note may need fecal diversion if does not improve - he continues to have diarrhea. - Appreciate CCM assistance   FEN - Okay for diet. NPO at midnight.  VTE - okay for chemical prophylaxis  ID - Linezolid and Zosyn. WBC 21.6 > 15.8 > 13.9 Foley - in place   LOS: 3 days    Jillyn Ledger , [facility] 12/03/2020, 11:55 AM Please see Amion for pager number during day hours 7:00am-4:30pm

## 2020-12-04 ENCOUNTER — Encounter (HOSPITAL_COMMUNITY)
Admission: EM | Disposition: A | Discharge status: Home Health | Payer: Self-pay | Source: Home / Self Care | Attending: Family Medicine

## 2020-12-04 ENCOUNTER — Inpatient Hospital Stay (HOSPITAL_COMMUNITY): Payer: Self-pay | Admitting: Certified Registered Nurse Anesthetist

## 2020-12-04 ENCOUNTER — Encounter (HOSPITAL_COMMUNITY): Payer: Self-pay

## 2020-12-04 DIAGNOSIS — E440 Moderate protein-calorie malnutrition: Secondary | ICD-10-CM | POA: Insufficient documentation

## 2020-12-04 DIAGNOSIS — C20 Malignant neoplasm of rectum: Secondary | ICD-10-CM

## 2020-12-04 HISTORY — PX: INCISION AND DRAINAGE ABSCESS: SHX5864

## 2020-12-04 LAB — AEROBIC/ANAEROBIC CULTURE W GRAM STAIN (SURGICAL/DEEP WOUND): Gram Stain: NONE SEEN

## 2020-12-04 LAB — CBC
HCT: 22.1 % — ABNORMAL LOW (ref 39.0–52.0)
Hemoglobin: 7.1 g/dL — ABNORMAL LOW (ref 13.0–17.0)
MCH: 28.7 pg (ref 26.0–34.0)
MCHC: 32.1 g/dL (ref 30.0–36.0)
MCV: 89.5 fL (ref 80.0–100.0)
Platelets: 372 10*3/uL (ref 150–400)
RBC: 2.47 MIL/uL — ABNORMAL LOW (ref 4.22–5.81)
RDW: 14.4 % (ref 11.5–15.5)
WBC: 13.4 10*3/uL — ABNORMAL HIGH (ref 4.0–10.5)
nRBC: 0.1 % (ref 0.0–0.2)

## 2020-12-04 LAB — GLUCOSE, CAPILLARY
Glucose-Capillary: 131 mg/dL — ABNORMAL HIGH (ref 70–99)
Glucose-Capillary: 158 mg/dL — ABNORMAL HIGH (ref 70–99)
Glucose-Capillary: 162 mg/dL — ABNORMAL HIGH (ref 70–99)

## 2020-12-04 LAB — BASIC METABOLIC PANEL
Anion gap: 6 (ref 5–15)
BUN: 9 mg/dL (ref 6–20)
CO2: 28 mmol/L (ref 22–32)
Calcium: 8.4 mg/dL — ABNORMAL LOW (ref 8.9–10.3)
Chloride: 106 mmol/L (ref 98–111)
Creatinine, Ser: 0.58 mg/dL — ABNORMAL LOW (ref 0.61–1.24)
GFR, Estimated: >60 mL/min (ref >60)
Glucose, Bld: 147 mg/dL — ABNORMAL HIGH (ref 70–99)
Potassium: 3.6 mmol/L (ref 3.5–5.1)
Sodium: 140 mmol/L (ref 135–145)

## 2020-12-04 LAB — OCCULT BLOOD X 1 CARD TO LAB, STOOL: Fecal Occult Bld: POSITIVE — AB

## 2020-12-04 LAB — ABO/RH: ABO/RH(D): A NEG

## 2020-12-04 LAB — PHOSPHORUS: Phosphorus: 3.1 mg/dL (ref 2.5–4.6)

## 2020-12-04 LAB — PREPARE RBC (CROSSMATCH)

## 2020-12-04 LAB — MAGNESIUM: Magnesium: 2.2 mg/dL (ref 1.7–2.4)

## 2020-12-04 SURGERY — INCISION AND DRAINAGE, ABSCESS
Anesthesia: General

## 2020-12-04 MED ORDER — ACETAMINOPHEN 500 MG PO TABS: 1000.0000 mg | ORAL_TABLET | Freq: Once | ORAL | Status: DC

## 2020-12-04 MED ORDER — FENTANYL CITRATE (PF) 100 MCG/2ML IJ SOLN
INTRAMUSCULAR | Status: DC | PRN
  Administered 2020-12-04 (×4): 25 ug via INTRAVENOUS
  Administered 2020-12-04 (×2): 50 ug via INTRAVENOUS

## 2020-12-04 MED ORDER — FENTANYL CITRATE (PF) 100 MCG/2ML IJ SOLN
INTRAMUSCULAR | Status: AC
  Filled 2020-12-04: qty 2

## 2020-12-04 MED ORDER — 0.9 % SODIUM CHLORIDE (POUR BTL) OPTIME
TOPICAL | Status: DC | PRN
  Administered 2020-12-04: 1000 mL

## 2020-12-04 MED ORDER — AMISULPRIDE (ANTIEMETIC) 5 MG/2ML IV SOLN: 10.0000 mg | Freq: Once | INTRAVENOUS | Status: DC | PRN

## 2020-12-04 MED ORDER — BUPIVACAINE-EPINEPHRINE (PF) 0.25% -1:200000 IJ SOLN
INTRAMUSCULAR | Status: AC
  Filled 2020-12-04: qty 30

## 2020-12-04 MED ORDER — ONDANSETRON 4 MG PO TBDP
4.0000 mg | ORAL_TABLET | Freq: Four times a day (QID) | ORAL | Status: DC | PRN
  Administered 2020-12-13: 4 mg via ORAL
  Filled 2020-12-04: qty 1

## 2020-12-04 MED ORDER — OXYCODONE HCL 5 MG PO TABS
5.0000 mg | ORAL_TABLET | ORAL | Status: DC
  Administered 2020-12-04 – 2020-12-13 (×52): 5 mg via ORAL
  Filled 2020-12-04 (×53): qty 1

## 2020-12-04 MED ORDER — ROCURONIUM BROMIDE 10 MG/ML (PF) SYRINGE
PREFILLED_SYRINGE | INTRAVENOUS | Status: AC
  Filled 2020-12-04: qty 10

## 2020-12-04 MED ORDER — METOPROLOL TARTRATE 5 MG/5ML IV SOLN: 5.0000 mg | Freq: Four times a day (QID) | INTRAVENOUS | Status: DC | PRN

## 2020-12-04 MED ORDER — FENTANYL CITRATE (PF) 100 MCG/2ML IJ SOLN
25.0000 ug | INTRAMUSCULAR | Status: DC | PRN
  Administered 2020-12-04: 50 ug via INTRAVENOUS

## 2020-12-04 MED ORDER — ONDANSETRON HCL 4 MG/2ML IJ SOLN
INTRAMUSCULAR | Status: DC | PRN
  Administered 2020-12-04: 4 mg via INTRAVENOUS

## 2020-12-04 MED ORDER — MIDAZOLAM HCL 2 MG/2ML IJ SOLN
INTRAMUSCULAR | Status: AC
  Filled 2020-12-04: qty 2

## 2020-12-04 MED ORDER — MIDAZOLAM HCL 5 MG/5ML IJ SOLN
INTRAMUSCULAR | Status: DC | PRN
  Administered 2020-12-04: 2 mg via INTRAVENOUS

## 2020-12-04 MED ORDER — PHENYLEPHRINE 40 MCG/ML (10ML) SYRINGE FOR IV PUSH (FOR BLOOD PRESSURE SUPPORT)
PREFILLED_SYRINGE | INTRAVENOUS | Status: DC | PRN
  Administered 2020-12-04 (×2): 80 ug via INTRAVENOUS
  Administered 2020-12-04: 120 ug via INTRAVENOUS

## 2020-12-04 MED ORDER — ALBUMIN HUMAN 5 % IV SOLN
INTRAVENOUS | Status: AC
  Filled 2020-12-04: qty 250

## 2020-12-04 MED ORDER — HYDROMORPHONE HCL 2 MG/ML IJ SOLN
INTRAMUSCULAR | Status: AC
  Filled 2020-12-04: qty 1

## 2020-12-04 MED ORDER — HYDROMORPHONE HCL 1 MG/ML IJ SOLN
INTRAMUSCULAR | Status: DC | PRN
  Administered 2020-12-04: .6 mg via INTRAVENOUS
  Administered 2020-12-04 (×2): .5 mg via INTRAVENOUS

## 2020-12-04 MED ORDER — ENOXAPARIN SODIUM 30 MG/0.3ML IJ SOSY: 30.0000 mg | PREFILLED_SYRINGE | INTRAMUSCULAR | Status: DC

## 2020-12-04 MED ORDER — PANTOPRAZOLE SODIUM 40 MG IV SOLR
40.0000 mg | Freq: Every day | INTRAVENOUS | Status: DC
  Administered 2020-12-04 – 2020-12-05 (×2): 40 mg via INTRAVENOUS
  Filled 2020-12-04 (×2): qty 40

## 2020-12-04 MED ORDER — PROMETHAZINE HCL 25 MG/ML IJ SOLN: 6.2500 mg | INTRAMUSCULAR | Status: DC | PRN

## 2020-12-04 MED ORDER — SODIUM CHLORIDE 0.9% IV SOLUTION: Freq: Once | INTRAVENOUS | Status: DC

## 2020-12-04 MED ORDER — SODIUM CHLORIDE 0.9 % IV SOLN
2.0000 g | Freq: Three times a day (TID) | INTRAVENOUS | Status: DC
  Administered 2020-12-04 – 2020-12-14 (×30): 2 g via INTRAVENOUS
  Filled 2020-12-04 (×31): qty 2

## 2020-12-04 MED ORDER — LIDOCAINE 2% (20 MG/ML) 5 ML SYRINGE
INTRAMUSCULAR | Status: AC
  Filled 2020-12-04: qty 5

## 2020-12-04 MED ORDER — ONDANSETRON HCL 4 MG/2ML IJ SOLN
4.0000 mg | Freq: Four times a day (QID) | INTRAMUSCULAR | Status: DC | PRN
  Filled 2020-12-04: qty 2

## 2020-12-04 MED ORDER — PHENYLEPHRINE 40 MCG/ML (10ML) SYRINGE FOR IV PUSH (FOR BLOOD PRESSURE SUPPORT)
PREFILLED_SYRINGE | INTRAVENOUS | Status: AC
  Filled 2020-12-04: qty 10

## 2020-12-04 MED ORDER — LACTATED RINGERS IV SOLN: INTRAVENOUS | Status: DC | PRN

## 2020-12-04 MED ORDER — LIDOCAINE 2% (20 MG/ML) 5 ML SYRINGE
INTRAMUSCULAR | Status: DC | PRN
  Administered 2020-12-04: 100 mg via INTRAVENOUS

## 2020-12-04 MED ORDER — PROPOFOL 10 MG/ML IV BOLUS
INTRAVENOUS | Status: AC
  Filled 2020-12-04: qty 20

## 2020-12-04 MED ORDER — METRONIDAZOLE 500 MG/100ML IV SOLN
500.0000 mg | Freq: Two times a day (BID) | INTRAVENOUS | Status: DC
  Administered 2020-12-04 – 2020-12-14 (×20): 500 mg via INTRAVENOUS
  Filled 2020-12-04 (×20): qty 100

## 2020-12-04 MED ORDER — KCL IN DEXTROSE-NACL 20-5-0.45 MEQ/L-%-% IV SOLN
INTRAVENOUS | Status: DC
  Filled 2020-12-04 (×3): qty 1000

## 2020-12-04 MED ORDER — ALBUMIN HUMAN 5 % IV SOLN: INTRAVENOUS | Status: DC | PRN

## 2020-12-04 MED ORDER — CELECOXIB 200 MG PO CAPS: 200.0000 mg | ORAL_CAPSULE | Freq: Once | ORAL | Status: DC

## 2020-12-04 MED ORDER — PROPOFOL 10 MG/ML IV BOLUS
INTRAVENOUS | Status: DC | PRN
  Administered 2020-12-04: 80 mg via INTRAVENOUS

## 2020-12-04 SURGICAL SUPPLY — 27 items
BAG COUNTER SPONGE SURGICOUNT (BAG) IMPLANT
BLADE SURG 15 STRL LF DISP TIS (BLADE) ×1 IMPLANT
BLADE SURG 15 STRL SS (BLADE) ×2
BNDG GAUZE ELAST 4 BULKY (GAUZE/BANDAGES/DRESSINGS) IMPLANT
COVER SURGICAL LIGHT HANDLE (MISCELLANEOUS) ×2 IMPLANT
DECANTER SPIKE VIAL GLASS SM (MISCELLANEOUS) IMPLANT
DRAIN PENROSE 0.5X18 (DRAIN) IMPLANT
DRSG PAD ABDOMINAL 8X10 ST (GAUZE/BANDAGES/DRESSINGS) IMPLANT
ELECT REM PT RETURN 15FT ADLT (MISCELLANEOUS) ×2 IMPLANT
GAUZE SPONGE 4X4 12PLY STRL (GAUZE/BANDAGES/DRESSINGS) IMPLANT
GLOVE SURG ENC TEXT LTX SZ8 (GLOVE) ×2 IMPLANT
GOWN STRL REUS W/TWL XL LVL3 (GOWN DISPOSABLE) ×4 IMPLANT
KIT BASIN OR (CUSTOM PROCEDURE TRAY) ×2 IMPLANT
KIT TURNOVER KIT A (KITS) ×2 IMPLANT
NEEDLE HYPO 22GX1.5 SAFETY (NEEDLE) IMPLANT
PACK LITHOTOMY IV (CUSTOM PROCEDURE TRAY) ×2 IMPLANT
PENCIL SMOKE EVACUATOR (MISCELLANEOUS) ×2 IMPLANT
SPONGE T-LAP 18X18 ~~LOC~~+RFID (SPONGE) ×2 IMPLANT
SUT ETHILON 3 0 PS 1 (SUTURE) IMPLANT
SUT VIC AB 3-0 SH 27 (SUTURE)
SUT VIC AB 3-0 SH 27XBRD (SUTURE) IMPLANT
SWAB COLLECTION DEVICE MRSA (MISCELLANEOUS) IMPLANT
SWAB CULTURE ESWAB REG 1ML (MISCELLANEOUS) IMPLANT
SYR BULB IRRIG 60ML STRL (SYRINGE) ×2 IMPLANT
SYR CONTROL 10ML LL (SYRINGE) IMPLANT
TOWEL OR 17X26 10 PK STRL BLUE (TOWEL DISPOSABLE) ×2 IMPLANT
TOWEL OR NON WOVEN STRL DISP B (DISPOSABLE) ×2 IMPLANT

## 2020-12-04 NOTE — Progress Notes (Signed)
Patient notified me that he had used the bathroom on myself. Upon inspection rectal tube had completely come out of rectum. Dressings on perineal area and beside scrotum were completely soiled with chocolate syrup consistency stool. I did a complete change of these dressings and placed foams to try to prevent further contamination and skin break down- he has several newer areas on both buttocks where skin is breaking down. Before we were finished changing out his linen he had another episode of bowl incontinence that completely soaked the dressings we had just replaced. I removed the packing from around the rectal area, and placed new moist gauze. I put a fecal collection pouch around the rectum, it is catching some of his stool but not all. It is not possible at this time with current interventions to keep the area clean. His rectum is directly beside one of his surgical incisions. I have placed foams around the pouch to assist holding it in place and I also put mesh panties on the patient. I will continue to re-assess frequently and change dressings as needed. The patient is still experiencing large amount of pain despite being medicated prior.

## 2020-12-04 NOTE — Anesthesia Postprocedure Evaluation (Signed)
Anesthesia Post Note  Patient: James Coffey  Procedure(s) Performed: [place]     Patient location during evaluation: PACU Anesthesia Type: General Level of consciousness: sedated Pain management: pain level controlled Vital Signs Assessment: post-procedure vital signs reviewed and stable Respiratory status: spontaneous breathing and respiratory function stable Cardiovascular status: stable Postop Assessment: no apparent nausea or vomiting Anesthetic complications: no   No notable events documented.  Last Vitals:  Vitals:   12/04/20 1115 12/04/20 1119  BP: 107/79   Pulse: 81 87  Resp: 15 16  Temp: 36.7 C   SpO2: 97% 91%    Last Pain:  Vitals:   12/04/20 1115  TempSrc:   PainSc: Asleep                 Liliani Bobo DANIEL

## 2020-12-04 NOTE — Progress Notes (Signed)
Patient is in considerable pain during dressing changes despite my best efforts to do it as gently as possible. I also pre-medicated with all meds prescribed and it still does not seems to be enough. He continuously interferes with care because he is guarding the affected areas with his hands so much. I think he may benefit from scheduled oxy in addition to the IV pain med orders he has. The patient will say he is not in pain but will not allow [place] to clean him adequately because of the amount of pain it causes him. He is having multiple runny Bms that are saturating the dressings. He would benefit from a flexi seal but it would need to be placed in the OR as nursing staff has attempted and failed due to the pain it causes. I have tried to place some foams to protect the area since the rectum leads right into one of his open wounds.

## 2020-12-04 NOTE — Progress Notes (Signed)
NAME:  James Coffey, MRN:  [id number], DOB:  March 27, 1971, LOS: 4 ADMISSION DATE:  11/29/2020, CONSULTATION DATE:  11/30/20 REFERRING MD:  Dr Georgette Dover, CHIEF COMPLAINT: Septic shock secondary to Fournier's gangrene  History of Present Illness:  50yo male was admitted with Fournier's gangrene Post I&D and debridement x3  Post I&D remained hypotensive, for which PCCM was consulted for management of septic shock  Pertinent  Medical History  NA  Significant Hospital Events:  6/25 I&D and debridement. Central line placement  6/26 Second I&D 6/28 Hypokalemic overnight, Levophed slightly increased to 52mcg 6/29 third I&D  Cefepime 6/24 Zosyn 6/24>> Vancomycin 6/24>>  Interim History / Subjective:  Sitting up in bed with reported pain of 6/10  Objective   Blood pressure (!) 89/52, pulse 82, temperature 98.4 F (36.9 C), temperature source Oral, resp. rate (!) 9, weight 75.1 kg, SpO2 100 %.        Intake/Output Summary (Last 24 hours) at 12/04/2020 1235 Last data filed at 12/04/2020 1200 Gross per 24 hour  Intake 2002.6 ml  Output 2455 ml  Net -452.4 ml    Filed Weights   11/30/20 0800  Weight: 75.1 kg   Physical Exam General: Acute on chronic ill appearing middle aged male lying in bed in NAD HEENT: [place]/AT, MM pink/moist, PERRL,  Neuro: Alert and oriented x3, non-focal CV: s1s2 regular rate and rhythm, no murmur, rubs, or gallops,  PULM:  Clear to ascultation bilaterally, no increased work of breathing, no adde breath sounds GI: soft, bowel sounds active in all 4 quadrants, non-tender, non-distended Extremities: warm/dry, no edema  Skin: no rashes or lesions  Labs/imaging that I have personally reviewed   6/25 CT ABD pelvis > Worsening perineal inflammation, including right peri-rectal. Significant progression of soft tissue gas, now extending anteriorly into the perineum, base of the penis, both scrotal sacs and anterior abdominal wall. Findings are highly suspicious for  necrotizing soft tissue infection, and urgent surgical consult is recommended.  Irregular presacral nodule measuring 2.2 cm may represent an enlarged lymph node but is nonspecific. Recommend follow-up after course of treatment. There is rectal wall thickening which may be inflammatory, however neoplasm is not excluded  Resolved Hospital Problem list   AKI  Assessment & Plan:  Septic shock secondary to Fournier's Gangrene of the perirectal, scrotal, penile and abdominal regions -S/p incision and drainage surgical debridement on 6/25, 6/26, 6/29 -Surgery and urology following -White blood cell count beginning to downtrend P: Primary management per general surgery and urology  Schedule Oxy to better control pain, daily assessment for ability to decreased opoid use  Continue vancomycin and Zosyn  Remains on low dose Levo, wean for MAPs greater than 65   Tobacco use disorder P: Continue Nicotine patch  Cessation education when appropriate   Normocytic anemia -Hemoglobin slowly downtrending from 12.8 four days ago to 7.5 today, likely secondary to ABLA and anemia of critical illness P: Hgb remains low but stable at 7.1 Continue to trend CBC  Monitor for further bleeding   Best Practice   Diet/type: dysphagia diet (see orders) DVT prophylaxis: Lovenox GI prophylaxis: N/A Lines: Central line Foley:  Yes, and it is still needed Code Status:  full code Last date of multidisciplinary goals of care discussion Per primary   CRITICAL CARE Performed by: Hettie Roselli D. Harris  Total critical care time: 34 minutes  Critical care time was exclusive of separately billable procedures and treating other patients.  Critical care was necessary to treat or prevent imminent  or life-threatening deterioration.  Critical care was time spent personally by me on the following activities: development of treatment plan with patient and/or surrogate as well as nursing, discussions with consultants,  evaluation of patient's response to treatment, examination of patient, obtaining history from patient or surrogate, ordering and performing treatments and interventions, ordering and review of laboratory studies, ordering and review of radiographic studies, pulse oximetry and re-evaluation of patient's condition.  Beverlyn Mcginness D. Kenton Kingfisher, NP-C [facility] Personal contact information can be found on Amion  12/04/2020, 1:15 PM

## 2020-12-04 NOTE — Op Note (Signed)
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 Alinda Money and by myself with irrigation and repacking with saline soaked Kerlix Location Surgery: [place] long OR #4 Complications: 9  EBL:   Minimal cc  Drains: Packing  Description of Procedure:  The patient was taken to OR for.  After anesthesia was administered and the patient was prepped  with Betadine in the dorsolithotomy position and a timeout was performed.  Dr. Alinda Money examined the patient and I observed his exam and then also examined the patient as well.  There were 6 areas that we ended up packing the wound at the.  The one on the top midline communicated 1 in the bottom of the midline and there did not appear to be any liquefaction of the fascia consistent with ongoing necrotizing fasciitis.  There was 1 spot in the scrotum there were it tends to die then toward the sphincters on the right side and I opened that up a little bit although I did not cut down into the sphincters.  Bleeding was controlled and then again all the 6 areas were packed with saline soaked Kerlix.  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 Johney Maine.  Timing of perhaps diversion will be decided by him.  At the present time it appears that his Fournier's is not extending and we will continue with supportive management of this man.  The patient tolerated the procedure well and was taken to the PACU in stable condition.     James Coffey, Rosalie, [facility], [place]

## 2020-12-04 NOTE — Anesthesia Procedure Notes (Signed)
Procedure Name: LMA Insertion Date/Time: 12/04/2020 9:30 AM Performed by: Claudia Desanctis, CRNA Pre-anesthesia Checklist: Patient identified, Emergency Drugs available, Patient being monitored, Timeout performed and Suction available Patient Re-evaluated:Patient Re-evaluated prior to induction Oxygen Delivery Method: Circle system utilized Preoxygenation: Pre-oxygenation with 100% oxygen Induction Type: IV induction Ventilation: Mask ventilation without difficulty LMA: LMA inserted LMA Size: 5.0 Number of attempts: 1 Placement Confirmation: positive ETCO2 and breath sounds checked- equal and bilateral Tube secured with: Tape Comments: LMA inserted Charyl Bigger, SRNA

## 2020-12-04 NOTE — Interval H&P Note (Signed)
History and Physical Interval Note:  12/04/2020 9:29 AM  James Coffey  has presented today for surgery, with the diagnosis of fourniers gangrene.  The various methods of treatment have been discussed with the patient and family. After consideration of risks, benefits and other options for treatment, the patient has consented to  Procedure(s): [place] (N/A) as a surgical intervention.  The patient's history has been reviewed, patient examined, no change in status, stable for surgery.  I have reviewed the patient's chart and labs.  Questions were answered to the patient's satisfaction.     Pedro Earls

## 2020-12-04 NOTE — Transfer of Care (Signed)
Immediate Anesthesia Transfer of Care Note  Patient: James Coffey  Procedure(s) Performed: [place]  Patient Location: PACU  Anesthesia Type:General  Level of Consciousness: awake, drowsy and patient cooperative  Airway & Oxygen Therapy: Patient Spontanous Breathing and Patient connected to face mask oxygen  Post-op Assessment: Report given to RN and Post -op Vital signs reviewed and stable  Post vital signs: Reviewed and stable  Last Vitals:  Vitals Value Taken Time  BP 117/73 1047  Temp    Pulse 80 12/04/20 1045  Resp    SpO2 100 % 12/04/20 1045  Vitals shown include unvalidated device data.  Last Pain:  Vitals:   12/04/20 0813  TempSrc: Oral  PainSc: 0-No pain         Complications: No notable events documented.

## 2020-12-04 NOTE — Progress Notes (Signed)
Request for fecal occult due to increase in BM frequency and color is significantly darker. HGB drop to 7.1 anticoagulation discontinued per order.

## 2020-12-04 NOTE — Plan of Care (Signed)
Dr. Johney Maine has requested evaluation of patient's rectal adenocarcinoma, discovered in midst of perirectal abscess requiring drainage and Fournier's gangrene requiring debridement, specifically regarding possible inpatient colonoscopy.  Patient getting operative debridement and I/D today.  We will evaluate patient tomorrow to assessment candidacy of, and potential timing for, colonoscopy.

## 2020-12-04 NOTE — Progress Notes (Signed)
eLink Physician-Brief Progress Note Patient Name: James Coffey DOB: 09-01-1970 MRN: [id number]   Date of Service  12/04/2020  HPI/Events of Note  Patient has dark stool suspicious for containing blood.  eICU Interventions  Fecal occult blood test ordered.        Kerry Kass Odessia Asleson 12/04/2020, 6:02 AM

## 2020-12-04 NOTE — Progress Notes (Signed)
PCCM Progress Note  Given persistent hypotension with continued need for pressor support with slight uptrend in the setting of acute blood loss anemia (hgb currently 7.1) decision made to transfuse one unit PRBC. Order placed. Post transfusion H&H to be drawn by nurse.   Analilia Geddis D. Kenton Kingfisher, NP-C [facility] Personal contact information can be found on Amion  12/04/2020, 5:16 PM

## 2020-12-04 NOTE — Addendum Note (Signed)
Addendum  created 12/04/20 1343 by Duane Boston, MD   Clinical Note Signed

## 2020-12-04 NOTE — Progress Notes (Signed)
Patient ID: James Coffey, male   DOB: 11/02/1970, 50 y.o.   MRN: [id number]  [name] was  taken to the OR today by Dr. Hassell Done.  I had the opportunity to examine him under anesthesia briefly.  His scrotal and penile skin appeared to be viable and intact without the need for further debridement.  His urethra appeared to be intact and in good condition.  There was an area of purulence that was noted extending more posteriorly toward the rectum.  I did make Dr. Hassell Done aware this who was going to further evaluate.  Please see his operative note for further details.

## 2020-12-05 ENCOUNTER — Encounter (HOSPITAL_COMMUNITY): Payer: Self-pay | Admitting: Surgery

## 2020-12-05 ENCOUNTER — Inpatient Hospital Stay: Payer: Self-pay

## 2020-12-05 DIAGNOSIS — D62 Acute posthemorrhagic anemia: Secondary | ICD-10-CM

## 2020-12-05 LAB — GLUCOSE, CAPILLARY
Glucose-Capillary: 161 mg/dL — ABNORMAL HIGH (ref 70–99)
Glucose-Capillary: 144 mg/dL — ABNORMAL HIGH (ref 70–99)
Glucose-Capillary: 170 mg/dL — ABNORMAL HIGH (ref 70–99)
Glucose-Capillary: 210 mg/dL — ABNORMAL HIGH (ref 70–99)

## 2020-12-05 LAB — CBC
HCT: 24.2 % — ABNORMAL LOW (ref 39.0–52.0)
Hemoglobin: 7.6 g/dL — ABNORMAL LOW (ref 13.0–17.0)
MCH: 28.4 pg (ref 26.0–34.0)
MCHC: 31.4 g/dL (ref 30.0–36.0)
MCV: 90.3 fL (ref 80.0–100.0)
Platelets: 394 10*3/uL (ref 150–400)
RBC: 2.68 MIL/uL — ABNORMAL LOW (ref 4.22–5.81)
RDW: 14.9 % (ref 11.5–15.5)
WBC: 12.6 10*3/uL — ABNORMAL HIGH (ref 4.0–10.5)
nRBC: 0.2 % (ref 0.0–0.2)

## 2020-12-05 LAB — TYPE AND SCREEN
ABO/RH(D): A NEG
Antibody Screen: NEGATIVE
Unit division: 0

## 2020-12-05 LAB — BASIC METABOLIC PANEL
Anion gap: 6 (ref 5–15)
BUN: 9 mg/dL (ref 6–20)
CO2: 27 mmol/L (ref 22–32)
Calcium: 8.2 mg/dL — ABNORMAL LOW (ref 8.9–10.3)
Chloride: 105 mmol/L (ref 98–111)
Creatinine, Ser: 0.63 mg/dL (ref 0.61–1.24)
GFR, Estimated: >60 mL/min (ref >60)
Glucose, Bld: 171 mg/dL — ABNORMAL HIGH (ref 70–99)
Potassium: 4 mmol/L (ref 3.5–5.1)
Sodium: 138 mmol/L (ref 135–145)

## 2020-12-05 LAB — BPAM RBC
Blood Product Expiration Date: 202207262359
ISSUE DATE / TIME: 202206292310
Unit Type and Rh: 600

## 2020-12-05 LAB — CULTURE, BLOOD (ROUTINE X 2)
Culture: NO GROWTH
Special Requests: ADEQUATE
Special Requests: ADEQUATE

## 2020-12-05 MED ORDER — SODIUM CHLORIDE 0.9% FLUSH: 10.0000 mL | INTRAVENOUS | Status: DC | PRN

## 2020-12-05 MED ORDER — SODIUM CHLORIDE 0.9% FLUSH
10.0000 mL | Freq: Two times a day (BID) | INTRAVENOUS | Status: DC
  Administered 2020-12-05: 20 mL
  Administered 2020-12-06 – 2020-12-07 (×3): 10 mL

## 2020-12-05 MED ORDER — KATE FARMS STANDARD 1.4 PO LIQD
325.0000 mL | Freq: Once | ORAL | Status: AC
  Administered 2020-12-05: 325 mL via ORAL
  Filled 2020-12-05: qty 325

## 2020-12-05 MED ORDER — FENTANYL CITRATE (PF) 100 MCG/2ML IJ SOLN
25.0000 ug | INTRAMUSCULAR | Status: DC | PRN
  Administered 2020-12-05: 50 ug via INTRAVENOUS
  Administered 2020-12-05 (×2): 25 ug via INTRAVENOUS
  Administered 2020-12-06 – 2020-12-12 (×16): 50 ug via INTRAVENOUS
  Filled 2020-12-05 (×19): qty 2

## 2020-12-05 MED ORDER — MIDODRINE HCL 5 MG PO TABS
5.0000 mg | ORAL_TABLET | Freq: Three times a day (TID) | ORAL | Status: DC
  Administered 2020-12-05 (×2): 5 mg via ORAL
  Filled 2020-12-05 (×2): qty 1

## 2020-12-05 NOTE — TOC Progression Note (Signed)
Transition of Care [facility]) - Progression Note    Patient Details  Name: James Coffey MRN: [id number] Date of Birth: November 06, 1970  [facility] CM/SW Contact  Leeroy Cha, RN Phone Number: 12/05/2020, 7:59 AM  Clinical Narrative:    To or on [id number]: 50 y/o gentleman with a history of tobacco abuse who presented with Fournier's gangrene s/p I&D. He underwent repeat I&D this morning and remains on vasopressors. He has pain 4/10, but is able to get some rest.      BP (!) 87/52   Pulse (!) 101   Temp 98.7 F (37.1 C) (Oral)   Resp 20   Wt 75.1 kg   SpO2 94%   BMI 22.45 kg/m    Ill appearing man lying in bed sleeping  Arouses to verbal stimulation, but less interactive today.  Cooperative with exam.  [place]/AT, eyes anicteric  S1S2, RRR  CTAB, breathing comfortably on [place]  Abdominal dressing clean, dry, intact.  Mild tenderness to palpation.  No guarding. No lower extremity edema, no clubbing or cyanosis Sleepy but arousable.  Moving all extremities spontaneously and answering questions appropriately.  Skin warm, dry, no rashes.     BBC 13.4 H/H 7.1/22.1 BUN 9 Creatinine 0.58  Wound culture:  FEW ESCHERICHIA COLI-pansensitive FEW ENTEROBACTER CLOACAE resistant to cefazolin, otherwise sensitive FEW BACTEROIDES OVATUS  BETA LACTAMASE POSITIVE      Assessment and plan Septic shock due to Fournier's gangrene--polymicrobial with E. coli, Enterobacter, Bacteroides. This was likely precipitated by [facility] of rectum.  -Needs ongoing broad gram-negative and anaerobic coverage.  Continue Zosyn.  -Appreciate surgery and urology's management  -Vasopressors as required to maintain MAP greater than 65.  Continues to have significant norepinephrine requirement.     Acute blood loss anemia - Given ongoing hypotension recommend 1 unit pRBCs  -transfuse for Hb <7 or hemodynamically significant bleeding     Rectal cancer, adenocarcinoma, new diagnosis.   -appreciate surgery's  management  -will need full staging evaluation   TOC PLAN OF CARE:  Following for progression of wound and for toc needs will follow to see if hhc needed. Expected Discharge Plan: [place] Barriers to Discharge: Continued Medical Work up  Expected Discharge Plan and Services Expected Discharge Plan: [place]   Discharge Planning Services: CM Consult   Living arrangements for the past 2 months: Apartment                                       Social Determinants of Health (SDOH) Interventions    Readmission Risk Interventions No flowsheet data found.

## 2020-12-05 NOTE — Consult Note (Addendum)
[place] Nurse requested for preoperative stoma site marking  Discussed stoma creation with patient; he is critically ill in ICU and did not ask questions.  Explained role of the [place] nurse team.  Left educational booklet and samples of pouching options in the room.   Examined patient lying and sitting upright, in order to place the marking in the patient's visual field, away from any creases or abdominal contour issues and within the rectus muscle. He has multiple full thickness post-op wounds to his abd which will need to be avoided.  He appears to have a slight crease to the line near his umbilicus when his head is raised which should be avoided if possible.  Dr Hassell Done of the surgical team at the bedside to assess abd appearance and discuss possible sites. Surgery team plans for possible OR soon. Large mark for colostomy in the LLQ  __6__ cm to the left of the umbilicus and __3__OI below the umbilicus.  Large mark for ileostomy in the RLQ  __6_cm to the right of the umbilicus and  __7_ cm below the umbilicus.  Small mark for colostomy in the LLQ  __6__ cm to the left of the umbilicus and __1__IW above the umbilicus.  Small mark for ileostomy in the RLQ  __6__cm to the right of the umbilicus and  __5_ cm above the umbilicus.  Patient's abdomen cleansed with CHG wipes at site markings, allowed to air dry prior to marking. [place] Nurse team will follow up with patient after surgery for continued ostomy care and teaching after surgery occurs. Thank-you,  Julien Girt MSN, [place], [place], [place], [place]

## 2020-12-05 NOTE — Progress Notes (Signed)
Peripherally Inserted Central Catheter Placement  The IV Nurse has discussed with the patient and/or persons authorized to consent for the patient, the purpose of this procedure and the potential benefits and risks involved with this procedure.  The benefits include less needle sticks, lab draws from the catheter, and the patient may be discharged home with the catheter. Risks include, but not limited to, infection, bleeding, blood clot (thrombus formation), and puncture of an artery; nerve damage and irregular heartbeat and possibility to perform a PICC exchange if needed/ordered by physician.  Alternatives to this procedure were also discussed.  [facility] [employer] PICC patient education guide, fact sheet on infection prevention and patient information card has been provided to patient /or left at bedside.    PICC Placement Documentation  PICC Double Lumen 12/05/20 PICC Right Brachial 40 cm 0 cm (Active)  Indication for Insertion or Continuance of Line Prolonged intravenous therapies 12/05/20 1904  Exposed Catheter (cm) 0 cm 12/05/20 1904  Site Assessment Clean;Dry;Intact 12/05/20 1904  Lumen #1 Status Flushed;Saline locked;Blood return noted 12/05/20 1904  Lumen #2 Status Flushed;Saline locked;Blood return noted 12/05/20 1904  Dressing Type Transparent 12/05/20 1904  Dressing Status Clean;Dry;Intact 12/05/20 1904  Antimicrobial disc in place? Yes 12/05/20 1904  Safety Lock Not Applicable 39/67/28 9791  Line Care Connections checked and tightened 12/05/20 1904  Line Adjustment (NICU/IV Team Only) No 12/05/20 1904  Dressing Intervention New dressing 12/05/20 1904  Dressing Change Due 12/12/20 12/05/20 1904       James Coffey, Nicolette Bang 12/05/2020, 7:06 PM

## 2020-12-05 NOTE — Progress Notes (Signed)
[facility] Progress Note:   1 Day Post-Op  Subjective: Mental status is clearer.  Complaints none. Objective: Vital signs in last 24 hours: Temp:  [98.1 F (36.7 C)-100 F (37.8 C)] 98.8 F (37.1 C) (06/30 0800) Pulse Rate:  [80-102] 85 (06/30 0600) Resp:  [9-26] 21 (06/30 0600) BP: (85-124)/(36-79) 107/53 (06/30 0600) SpO2:  [88 %-100 %] 98 % (06/30 0600)  Intake/Output from previous day: 06/29 0701 - 06/30 0700 In: 2515.1 [I.V.:1696.1; Blood:327.5; IV Piggyback:491.5] Out: 8657 [Urine:3725; Blood:5] Intake/Output this shift: Total I/O In: 199 [I.V.:99; IV Piggyback:100] Out: -   Physical Exam: Work of breathing is not labored.  Seen with Ms. Barbie Haggis.  The midline abdominal incisions still connect and I would try to get this connection sealed before diverting colostomy.  This will need to be done above the umbilicus and even then, spillage could create a fistulous tract in the lower midline.    Lab Results:  Results for orders placed or performed during the hospital encounter of 11/29/20 (from the past 48 hour(s))  Glucose, capillary     Status: Abnormal   Collection Time: 12/03/20 11:58 AM  Result Value Ref Range   Glucose-Capillary 155 (H) 70 - 99 mg/dL    Comment: Glucose reference range applies only to samples taken after fasting for at least 8 hours.   Comment 1 Notify RN    Comment 2 Document in Chart   Glucose, capillary     Status: Abnormal   Collection Time: 12/03/20  6:05 PM  Result Value Ref Range   Glucose-Capillary 127 (H) 70 - 99 mg/dL    Comment: Glucose reference range applies only to samples taken after fasting for at least 8 hours.   Comment 1 Notify RN    Comment 2 Document in Chart   phosphorus level     Status: Abnormal   Collection Time: 12/03/20  8:00 PM  Result Value Ref Range   Phosphorus 2.4 (L) 2.5 - 4.6 mg/dL    Comment: Performed at [facility], [place] [address] [place], [place] [address]  Glucose, capillary      Status: Abnormal   Collection Time: 12/03/20 10:29 PM  Result Value Ref Range   Glucose-Capillary 157 (H) 70 - 99 mg/dL    Comment: Glucose reference range applies only to samples taken after fasting for at least 8 hours.   Comment 1 Notify RN    Comment 2 Document in Chart   CBC     Status: Abnormal   Collection Time: 12/04/20  4:35 AM  Result Value Ref Range   WBC 13.4 (H) 4.0 - 10.5 K/uL   RBC 2.47 (L) 4.22 - 5.81 MIL/uL   Hemoglobin 7.1 (L) 13.0 - 17.0 g/dL    Comment: REPEATED TO VERIFY   HCT 22.1 (L) 39.0 - 52.0 %   MCV 89.5 80.0 - 100.0 fL   MCH 28.7 26.0 - 34.0 pg   MCHC 32.1 30.0 - 36.0 g/dL   RDW 14.4 11.5 - 15.5 %   Platelets 372 150 - 400 K/uL   nRBC 0.1 0.0 - 0.2 %    Comment: Performed at [facility], [place] [address] [place], [place] [address]  Basic metabolic panel     Status: Abnormal   Collection Time: 12/04/20  4:35 AM  Result Value Ref Range   Sodium 140 135 - 145 mmol/L   Potassium 3.6 3.5 - 5.1 mmol/L    Comment: DELTA CHECK NOTED   Chloride  106 98 - 111 mmol/L   CO2 28 22 - 32 mmol/L   Glucose, Bld 147 (H) 70 - 99 mg/dL    Comment: Glucose reference range applies only to samples taken after fasting for at least 8 hours.   BUN 9 6 - 20 mg/dL   Creatinine, Ser 0.58 (L) 0.61 - 1.24 mg/dL   Calcium 8.4 (L) 8.9 - 10.3 mg/dL   GFR, Estimated >60 >60 mL/min    Comment: (NOTE) Calculated using the CKD-EPI Creatinine Equation (2021)    Anion gap 6 5 - 15    Comment: Performed at [facility], [place] [address], [place], [place] [address]  Magnesium     Status: None   Collection Time: 12/04/20  4:35 AM  Result Value Ref Range   Magnesium 2.2 1.7 - 2.4 mg/dL    Comment: Performed at [facility], [place] [address], [place], [place] [address]  Phosphorus     Status: None   Collection Time: 12/04/20  4:35 AM  Result Value Ref Range   Phosphorus 3.1 2.5 - 4.6 mg/dL    Comment: Performed at [facility], [place] [address] [place], [place] [address]  ABO/Rh     Status: None   Collection Time: 12/04/20  4:38 AM  Result Value Ref Range   ABO/RH(D)      A NEG Performed at [place] [address], [place], [place] [address]   Occult blood card to lab, stool     Status: Abnormal   Collection Time: 12/04/20  6:02 AM  Result Value Ref Range   Fecal Occult Bld POSITIVE (A) NEGATIVE    Comment: Performed at [facility], [place] [address], [place], [place] [address]  Type and screen [place]     Status: None   Collection Time: 12/04/20  7:58 AM  Result Value Ref Range   ABO/RH(D) A NEG    Antibody Screen NEG    Sample Expiration 12/07/2020,2359    Unit Number [id number]    Blood Component Type RBC LR PHER1    Unit division 00    Status of Unit ISSUED,FINAL    Transfusion Status OK TO TRANSFUSE    Crossmatch Result      Compatible Performed at [facility], [place] [name], [place], [place] [address]   Glucose, capillary     Status: Abnormal   Collection Time: 12/04/20 11:45 AM  Result Value Ref Range   Glucose-Capillary 131 (H) 70 - 99 mg/dL    Comment: Glucose reference range applies only to samples taken after fasting for at least 8 hours.  Glucose, capillary     Status: Abnormal   Collection Time: 12/04/20  5:00 PM  Result Value Ref Range   Glucose-Capillary 158 (H) 70 - 99 mg/dL    Comment: Glucose reference range applies only to samples taken after fasting for at least 8 hours.  Prepare RBC (crossmatch)     Status: None   Collection Time: 12/04/20  5:32 PM  Result Value Ref Range   Order Confirmation      ORDER PROCESSED BY BLOOD BANK Performed at [facility], [place] [address] [place],  [address]   Glucose, capillary     Status: Abnormal   Collection Time: 12/04/20 10:17 PM  Result Value Ref Range   Glucose-Capillary 162 (H) 70 - 99 mg/dL     Comment: Glucose reference range applies only to samples  taken after fasting for at least 8 hours.  CBC     Status: Abnormal   Collection Time: 12/05/20  3:35 AM  Result Value Ref Range   WBC 12.6 (H) 4.0 - 10.5 K/uL   RBC 2.68 (L) 4.22 - 5.81 MIL/uL   Hemoglobin 7.6 (L) 13.0 - 17.0 g/dL   HCT 24.2 (L) 39.0 - 52.0 %   MCV 90.3 80.0 - 100.0 fL   MCH 28.4 26.0 - 34.0 pg   MCHC 31.4 30.0 - 36.0 g/dL   RDW 14.9 11.5 - 15.5 %   Platelets 394 150 - 400 K/uL   nRBC 0.2 0.0 - 0.2 %    Comment: Performed at [facility], [place] [address] [place], [place] [address]  Basic metabolic panel     Status: Abnormal   Collection Time: 12/05/20  3:35 AM  Result Value Ref Range   Sodium 138 135 - 145 mmol/L   Potassium 4.0 3.5 - 5.1 mmol/L   Chloride 105 98 - 111 mmol/L   CO2 27 22 - 32 mmol/L   Glucose, Bld 171 (H) 70 - 99 mg/dL    Comment: Glucose reference range applies only to samples taken after fasting for at least 8 hours.   BUN 9 6 - 20 mg/dL   Creatinine, Ser 0.63 0.61 - 1.24 mg/dL   Calcium 8.2 (L) 8.9 - 10.3 mg/dL   GFR, Estimated >60 >60 mL/min    Comment: (NOTE) Calculated using the CKD-EPI Creatinine Equation (2021)    Anion gap 6 5 - 15    Comment: Performed at [facility], [place] [address] [place], [place] [address]  Glucose, capillary     Status: Abnormal   Collection Time: 12/05/20  8:30 AM  Result Value Ref Range   Glucose-Capillary 161 (H) 70 - 99 mg/dL    Comment: Glucose reference range applies only to samples taken after fasting for at least 8 hours.   Comment 1 Notify RN    Comment 2 Document in Chart     Radiology/Results: No results found.  Anti-infectives: Anti-infectives (From admission, onward)    Start     Dose/Rate Route Frequency Ordered Stop   12/04/20 2200  ceFEPIme (MAXIPIME) 2 g in sodium chloride 0.9 % 100 mL IVPB        2 g 200 mL/hr over 30 Minutes Intravenous Every 8 hours 12/04/20 1327     12/04/20 2200   metroNIDAZOLE (FLAGYL) IVPB 500 mg        500 mg 100 mL/hr over 60 Minutes Intravenous Every 12 hours 12/04/20 1327     12/02/20 1400  linezolid (ZYVOX) IVPB 600 mg  Status:  Discontinued        600 mg 300 mL/hr over 60 Minutes Intravenous Every 12 hours 12/02/20 1138 12/04/20 1327   11/30/20 1500  vancomycin (VANCOREADY) IVPB 1250 mg/250 mL  Status:  Discontinued        1,250 mg 166.7 mL/hr over 90 Minutes Intravenous Every 12 hours 11/30/20 0606 12/02/20 1138   11/30/20 0600  piperacillin-tazobactam (ZOSYN) IVPB 3.375 g  Status:  Discontinued        3.375 g 12.5 mL/hr over 240 Minutes Intravenous Every 8 hours 11/30/20 0237 12/04/20 1327   11/30/20 0403  piperacillin-tazobactam (ZOSYN) 3.375 GM/50ML IVPB       Note to Pharmacy: Hedy Camara   : cabinet override      11/30/20 0403 12/01/20 0206   11/30/20 0230  clindamycin (CLEOCIN) IVPB  600 mg  Status:  Discontinued        600 mg 100 mL/hr over 30 Minutes Intravenous  Once 11/30/20 0219 11/30/20 0237   11/30/20 0000  ceFEPIme (MAXIPIME) 2 g in sodium chloride 0.9 % 100 mL IVPB        2 g 200 mL/hr over 30 Minutes Intravenous  Once 11/29/20 2347 11/30/20 0033   11/30/20 0000  vancomycin (VANCOREADY) IVPB 1500 mg/300 mL        1,500 mg 150 mL/hr over 120 Minutes Intravenous  Once 11/29/20 2347 11/30/20 0300   11/29/20 2345  vancomycin (VANCOCIN) IVPB 1000 mg/200 mL premix  Status:  Discontinued        1,000 mg 200 mL/hr over 60 Minutes Intravenous  Once 11/29/20 2340 11/29/20 2347   11/29/20 2345  ceFEPIme (MAXIPIME) 1 g in sodium chloride 0.9 % 100 mL IVPB  Status:  Discontinued        1 g 200 mL/hr over 30 Minutes Intravenous  Once 11/29/20 2340 11/29/20 2347       Assessment/Plan: Problem List: Patient Active Problem List   Diagnosis Date Noted   Malnutrition of moderate degree 12/04/2020   Rectal cancer ([place]) 12/04/2020   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  Will need diversion in the future but delaying to get abdominal wounds healed.   1 Day Post-Op    LOS: 5 days   Matt B. Hassell Done, MD, [facility] [phone] to reach the surgeon on call.    12/05/2020 9:41 AM

## 2020-12-05 NOTE — Progress Notes (Signed)
Patient ID: James Coffey, male   DOB: 13-Jul-1970, 50 y.o.   MRN: [id number]   1 Day Post-Op Subjective: Pt feeling well still.  Still requiring norepinephrine (more than yesterday).  Objective: Vital signs in last 24 hours: Temp:  [98.8 F (37.1 C)-100 F (37.8 C)] 99.7 F (37.6 C) (06/30 1215) Pulse Rate:  [56-102] 85 (06/30 1500) Resp:  [11-28] 24 (06/30 1500) BP: (85-124)/(36-70) 106/56 (06/30 1500) SpO2:  [90 %-99 %] 97 % (06/30 1500)  Intake/Output from previous day: 06/29 0701 - 06/30 0700 In: 2515.1 [I.V.:1696.1; Blood:327.5; IV Piggyback:491.5] Out: 9450 [Urine:3725; Blood:5] Intake/Output this shift: Total I/O In: 474.3 [I.V.:274.3; IV Piggyback:200] Out: -   Physical Exam:  General: Alert and oriented GU: Scrotal and penile skin intact and unchanged from yesterday.  Foley in place  Lab Results: Recent Labs    12/03/20 0507 12/04/20 0435 12/05/20 0335  HGB 7.5* 7.1* 7.6*  HCT 24.2* 22.1* 24.2*   BMET Recent Labs    12/04/20 0435 12/05/20 0335  NA 140 138  K 3.6 4.0  CL 106 105  CO2 28 27  GLUCOSE 147* 171*  BUN 9 9  CREATININE 0.58* 0.63  CALCIUM 8.4* 8.2*     Studies/Results:   Assessment/Plan: Necrotizing fasciitis of perineum/scrotum/penis/abdominal wall:  Pt still with intact and viable genital skin of the scrotum and penis.  Urethra and corpora cavernosa exposed within his wounds but uninvolved when re-examined yesterday.  No indication for further urologic intervention at this time except to monitor GU structures during wound healing. Continue Foley.  I will be out for the next few days.  However, our service will continue to follow and be available if further evaluation in the OR is needed.  If the patient does go back to the OR, please notify Dr. Gloriann Loan [facility]) or Dr. Junious Silk (Sunday/Monday).     LOS: 5 days   Dutch Gray 12/05/2020, 4:30 PM

## 2020-12-05 NOTE — Progress Notes (Addendum)
NAME:  Jourden Gilson, MRN:  [id number], DOB:  May 15, 1971, LOS: 5 ADMISSION DATE:  11/29/2020, CONSULTATION DATE:  11/30/20 REFERRING MD:  Dr Georgette Dover, CHIEF COMPLAINT: Septic shock secondary to Fournier's gangrene  History of Present Illness:  50yo male was admitted with Fournier's gangrene Post I&D and debridement x3  Post I&D remained hypotensive, for which PCCM was consulted for management of septic shock  Pertinent  Medical History  NA  Significant Hospital Events:  6/25 I&D and debridement. Central line placement  6/26 Second I&D 6/28 Hypokalemic overnight, Levophed slightly increased to 82mcg 6/29 third I&D 6/30 remains on levo, great urine output. Received one unit PRBC overnight for hgb 7.1  Cefepime 6/24, 6/29 Zosyn 6/24>> 6/29 Vancomycin 6/24>> 6/26  Interim History / Subjective:  States pain in better controlled with scheduled oxy Issues with wound care and loose stools overnight   Objective   Blood pressure (!) 107/53, pulse 85, temperature 98.8 F (37.1 C), temperature source Oral, resp. rate (!) 21, weight 75.1 kg, SpO2 98 %.        Intake/Output Summary (Last 24 hours) at 12/05/2020 2774 Last data filed at 12/05/2020 0500 Gross per 24 hour  Intake 2506.23 ml  Output 3730 ml  Net -1223.77 ml    Filed Weights   11/30/20 0800  Weight: 75.1 kg   Physical Exam General: Acute on chronic ill appearing middle-aged male sitting up in bed in NAD HEENT: [place]/AT, MM pink/moist, PERRL,  Neuro: Alert and oriented x3, non-focal  CV: s1s2 regular rate and rhythm, no murmur, rubs, or gallops,  PULM:  Clear to ascultation bilaterally, no increased work of breathing GI: soft, bowel sounds active in all 4 quadrants, non-tender, non-distended, tolerating oral diet  Extremities: warm/dry, no edema  Skin: no rashes or lesions  Labs/imaging that I have personally reviewed   6/25 CT ABD pelvis > Worsening perineal inflammation, including right peri-rectal. Significant  progression of soft tissue gas, now extending anteriorly into the perineum, base of the penis, both scrotal sacs and anterior abdominal wall. Findings are highly suspicious for necrotizing soft tissue infection, and urgent surgical consult is recommended.  Irregular presacral nodule measuring 2.2 cm may represent an enlarged lymph node but is nonspecific. Recommend follow-up after course of treatment. There is rectal wall thickening which may be inflammatory, however neoplasm is not excluded  Resolved Hospital Problem list   AKI  Assessment & Plan:  Septic shock secondary to Fournier's Gangrene of the perirectal, scrotal, penile and abdominal regions -S/p incision and drainage surgical debridement on 6/25, 6/26, 6/29 -Surgery and urology following -White blood cell count beginning to downtrend P: Decrease oral hydration in favor or oral  Continue scheduled oxy, daily assessment for ability to deescalate opoid use  Continue cefepime and flagyl Wounds care  Continue to try and wean levo as able Start Midodrine Wound care per CCS, urology, and GI  Adenocarcinoma of the rectum -Noted during debridement of Fouriner's gangrene  P: GI consulted, appreciate assistance Colonoscopy to be completed once patient is stable   Tobacco use disorder P: Nicotine patch in place  Cessation education provided   Normocytic anemia with mixed blood loss anemia  -Hemoglobin slowly downtrending from 12.8 four days ago to 7.5 today, likely secondary to ABLA and anemia of critical illness P: Hgb stable at 7.6 Continue to trend CBC  Monitor to bleeding   Best Practice   Diet/type: dysphagia diet (see orders) DVT prophylaxis: Lovenox GI prophylaxis: N/A Lines: Central line Foley:  Yes, and  it is still needed Code Status:  full code Last date of multidisciplinary goals of care discussion Per primary   CRITICAL CARE Performed by: Whitney D. Harris  Total critical care time: 35 minutes  Critical  care time was exclusive of separately billable procedures and treating other patients.  Critical care was necessary to treat or prevent imminent or life-threatening deterioration.  Critical care was time spent personally by me on the following activities: development of treatment plan with patient and/or surrogate as well as nursing, discussions with consultants, evaluation of patient's response to treatment, examination of patient, obtaining history from patient or surrogate, ordering and performing treatments and interventions, ordering and review of laboratory studies, ordering and review of radiographic studies, pulse oximetry and re-evaluation of patient's condition.  Whitney D. Kenton Kingfisher, NP-C [facility] Personal contact information can be found on Amion  12/05/2020, 8:42 AM   Attending attestation: Mr. Gatley is feeling well. Tired, but abdominal pain is controlled. No n/v. BP (!) 107/53   Pulse 85   Temp 98.8 F (37.1 C) (Oral)   Resp (!) 21   Wt 75.1 kg   SpO2 98%   BMI 22.45 kg/m  Middle aged man lying in bed in NAD [place]/AT, eyes anicteric Breathing comfortably on nasal cannula, CTA B.  No conversational dyspnea. S1-S2, regular rate and rhythm, no murmurs Abdominal tenderness to palpation in the lower quadrants, soft.  Dressings not taken down to examine wounds. Sleeping but arouses easily to verbal stimulation.  Normal speech, answering questions appropriately, moving all extremities. No lower extremity edema, no clubbing or cyanosis Skin warm, no rashes  Rectal biopsy-adenocarcinoma WBC 12.6 H/H 7.6/24.2 Platelets 394  Assessment & plan: Septic shock due to Fournier's gangrene, likely portal of entry was rectal mass. S/p multiple debridements. -appreciate urology and surgery's input -wean pressors as able to maintain MAP>65 -con't antibiotics-- changed back to flagyl and cefepime today. Discussed with ID pharmD -con't wound care per surgery's  recommendations -adding midodrine -ok to continue maintenance fluids for now, but can likely d/c soon if his oral intake remains adequate  Acute respiratory failure with hypoxia -wean O2 for SpO2> 90% -OOB mobility as able  Acute blood loss anemia due to operative loss, critical illness. 1 unit pRBCs on 6/29. -holding lovenox with drop in Hb; hopefully can resume tomorrow -transfuse for Hb<7 or hemodynamically significatnt bleeding  Tobacco abuse -recommend total cessation -nicotine replacement therapy  Newly diagnosed rectal cancer- adenocarcinoma -needs c-scope when more stable -needs full cancer staging  This patient is critically ill with multiple organ system failure which requires frequent high complexity decision making, assessment, support, evaluation, and titration of therapies. This was completed through the application of advanced monitoring technologies and extensive interpretation of multiple databases. During this encounter critical care time was devoted to patient care services described in this note for 36 minutes.  Julian Hy, DO 12/05/20 11:01 AM [facility]

## 2020-12-05 NOTE — Progress Notes (Signed)
[facility] Gastroenterology Progress Note  James Coffey 50 y.o. 1971-01-23   Subjective: Denies abdominal pain. S/P debridement for Fournier's gangrene with SCC of rectum.  Objective: Vital signs: Vitals:   12/05/20 0500 12/05/20 0600  BP: 97/60 (!) 107/53  Pulse: 89 85  Resp: (!) 22 (!) 21  Temp:    SpO2: 96% 98%  T 98.8  Physical Exam: Gen: lethargic, thin, no acute distress  HEENT: anicteric sclera CV: RRR Chest: CTA B Abd: diffuse tenderness with guarding, surgical dressing in place, nondistended, +BS Ext: no edema  Lab Results: Recent Labs    12/03/20 0507 12/03/20 2000 12/04/20 0435 12/05/20 0335  NA 139  --  140 138  K 2.7*  --  3.6 4.0  CL 112*  --  106 105  CO2 24  --  28 27  GLUCOSE 121*  --  147* 171*  BUN 14  --  9 9  CREATININE 0.52*  --  0.58* 0.63  CALCIUM 6.6*  --  8.4* 8.2*  MG 1.7  --  2.2  --   PHOS 1.1* 2.4* 3.1  --    No results for input(s): AST, ALT, ALKPHOS, BILITOT, PROT, ALBUMIN in the last 72 hours. Recent Labs    12/04/20 0435 12/05/20 0335  WBC 13.4* 12.6*  HGB 7.1* 7.6*  HCT 22.1* 24.2*  MCV 89.5 90.3  PLT 372 394      Assessment/Plan: Adenocarcinoma of the rectum noted during debridement of Fournier's gangrene with septic shock on pressors. Request for colonoscopy by CCS. No signs of active GI bleeding and with vasopressor support I do not recommend a colonoscopy at this time but will have my partner re-visit early next week to decide if his condition is more stable to allow colon prep and sedation for a colonoscopy. Please call us back if questions.   [name] 12/05/2020, 8:23 AM  Questions please call [phone] Patient ID: James Coffey, male   DOB: 29-Dec-1970, 49 y.o.   MRN: [id number]

## 2020-12-05 NOTE — Progress Notes (Signed)
RN removed CVC in R IJ. Pt O2 desat to 60s. 6L [place] placed and O2 increased to low 90s. An incentive spirometer given to pt w/ education.  RN will continue to monitor

## 2020-12-06 ENCOUNTER — Inpatient Hospital Stay (HOSPITAL_COMMUNITY): Payer: Self-pay

## 2020-12-06 DIAGNOSIS — I428 Other cardiomyopathies: Secondary | ICD-10-CM

## 2020-12-06 LAB — VITAMIN B12: Vitamin B-12: 270 pg/mL (ref 180–914)

## 2020-12-06 LAB — ECHOCARDIOGRAM COMPLETE
Area-P 1/2: 4.68 cm2
S' Lateral: 3.7 cm
Weight: 2649.05 oz

## 2020-12-06 LAB — CBC
HCT: 25.8 % — ABNORMAL LOW (ref 39.0–52.0)
Hemoglobin: 8.1 g/dL — ABNORMAL LOW (ref 13.0–17.0)
MCH: 28.7 pg (ref 26.0–34.0)
MCHC: 31.4 g/dL (ref 30.0–36.0)
MCV: 91.5 fL (ref 80.0–100.0)
Platelets: 436 K/uL — ABNORMAL HIGH (ref 150–400)
RBC: 2.82 MIL/uL — ABNORMAL LOW (ref 4.22–5.81)
RDW: 16.6 % — ABNORMAL HIGH (ref 11.5–15.5)
WBC: 12.8 K/uL — ABNORMAL HIGH (ref 4.0–10.5)
nRBC: 0.4 % — ABNORMAL HIGH (ref 0.0–0.2)

## 2020-12-06 LAB — IRON AND TIBC
Iron: 22 ug/dL — ABNORMAL LOW (ref 45–182)
Saturation Ratios: 9 % — ABNORMAL LOW (ref 17.9–39.5)
TIBC: 235 ug/dL — ABNORMAL LOW (ref 250–450)
UIBC: 213 ug/dL

## 2020-12-06 LAB — FERRITIN: Ferritin: 196 ng/mL (ref 24–336)

## 2020-12-06 LAB — RETICULOCYTES
Immature Retic Fract: 47.5 % — ABNORMAL HIGH (ref 2.3–15.9)
RBC.: 2.81 MIL/uL — ABNORMAL LOW (ref 4.22–5.81)
Retic Count, Absolute: 143.9 K/uL (ref 19.0–186.0)
Retic Ct Pct: 5.1 % — ABNORMAL HIGH (ref 0.4–3.1)

## 2020-12-06 LAB — GLUCOSE, CAPILLARY
Glucose-Capillary: 229 mg/dL — ABNORMAL HIGH (ref 70–99)
Glucose-Capillary: 127 mg/dL — ABNORMAL HIGH (ref 70–99)
Glucose-Capillary: 142 mg/dL — ABNORMAL HIGH (ref 70–99)
Glucose-Capillary: 109 mg/dL — ABNORMAL HIGH (ref 70–99)

## 2020-12-06 LAB — FOLATE: Folate: 8.9 ng/mL (ref >5.9)

## 2020-12-06 LAB — CORTISOL: Cortisol, Plasma: 17.4 ug/dL

## 2020-12-06 MED ORDER — ENOXAPARIN SODIUM 40 MG/0.4ML IJ SOSY
40.0000 mg | PREFILLED_SYRINGE | Freq: Every day | INTRAMUSCULAR | Status: DC
  Administered 2020-12-06 – 2020-12-19 (×14): 40 mg via SUBCUTANEOUS
  Filled 2020-12-06 (×14): qty 0.4

## 2020-12-06 MED ORDER — MIDODRINE HCL 5 MG PO TABS
10.0000 mg | ORAL_TABLET | Freq: Three times a day (TID) | ORAL | Status: DC
  Administered 2020-12-06 – 2020-12-19 (×40): 10 mg via ORAL
  Filled 2020-12-06 (×40): qty 2

## 2020-12-06 MED ORDER — FERROUS SULFATE 325 (65 FE) MG PO TABS
325.0000 mg | ORAL_TABLET | Freq: Every day | ORAL | Status: DC
  Administered 2020-12-06 – 2020-12-19 (×14): 325 mg via ORAL
  Filled 2020-12-06 (×14): qty 1

## 2020-12-06 MED ORDER — PANTOPRAZOLE SODIUM 40 MG PO TBEC
40.0000 mg | DELAYED_RELEASE_TABLET | Freq: Every day | ORAL | Status: DC
  Administered 2020-12-06 – 2020-12-18 (×13): 40 mg via ORAL
  Filled 2020-12-06 (×13): qty 1

## 2020-12-06 NOTE — Progress Notes (Signed)
2 Days Post-Op  Subjective: CC: Patient reports his pain has greatly improved since his last surgery. Still on pressors.   Objective: Vital signs in last 24 hours: Temp:  [98.3 F (36.8 C)-99.3 F (37.4 C)] 98.6 F (37 C) (07/01 1200) Pulse Rate:  [66-104] 99 (07/01 1300) Resp:  [15-30] 21 (07/01 1300) BP: (85-140)/(38-78) 114/62 (07/01 1230) SpO2:  [74 %-100 %] 97 % (07/01 1300) Last BM Date: 12/05/20  Intake/Output from previous day: 06/30 0701 - 07/01 0700 In: 1447.9 [I.V.:952.4; IV Piggyback:495.5] Out: 4098 [Urine:4325] Intake/Output this shift: Total I/O In: 650.3 [I.V.:450.3; IV Piggyback:200] Out: -   PE: Gen: Awake and alert, pleasant Lungs: Normal rate and effort Heart: Reg Wounds: Seen with RN  LLQ incision measures 3 cm.  There is healthy granulation tissue at the base.  No drainage. This connects with infraumbilical incision that measures 8 cm.  There is healthy granulation tissue at the base.  There is no drainage. This connects with suprapubic and RLQ incision. RLQ incision is 2.5cm with healthy granulation tissue at the base.  No drainage.Suprapubic wound measures 8 cm with 5 cm of inferior tracking.  Periwound is without evidence of cellulitis. Right perineum incision as noted in picture below.  Wound measures ~15cm. He did have some stool that needed to be cleaned before evaluating the wound but is more formed then when I previously saw him. Wound with ~80% pink granulation tissue. There is few areas of fibropurulent like tissue as seen in the picture. No drainage.  Periwound without cellulitis but does appear to have fungal infection. Scrotal edema noted. Foley in place.  Abdominal overview  Infra-umbilical wound  Suprapubic wound  LLQ wound  RLQ wound  Perineal wound     Lab Results:  Recent Labs    12/05/20 0335 12/06/20 0548  WBC 12.6* 12.8*  HGB 7.6* 8.1*  HCT 24.2* 25.8*  PLT 394 436*   BMET Recent Labs    12/04/20 0435  12/05/20 0335  NA 140 138  K 3.6 4.0  CL 106 105  CO2 28 27  GLUCOSE 147* 171*  BUN 9 9  CREATININE 0.58* 0.63  CALCIUM 8.4* 8.2*   PT/INR No results for input(s): LABPROT, INR in the last 72 hours. CMP     Component Value Date/Time   NA 138 12/05/2020 0335   K 4.0 12/05/2020 0335   CL 105 12/05/2020 0335   CO2 27 12/05/2020 0335   GLUCOSE 171 (H) 12/05/2020 0335   BUN 9 12/05/2020 0335   CREATININE 0.63 12/05/2020 0335   CALCIUM 8.2 (L) 12/05/2020 0335   PROT 8.0 11/29/2020 2302   ALBUMIN 3.2 (L) 11/29/2020 2302   AST 20 11/29/2020 2302   ALT 23 11/29/2020 2302   ALKPHOS 116 11/29/2020 2302   BILITOT 1.1 11/29/2020 2302   GFRNONAA >60 12/05/2020 0335   Lipase     Component Value Date/Time   LIPASE 22 11/03/2020 1714       Studies/Results: [place] EKG SITE RITE  Result Date: 12/05/2020 If Site Rite image not attached, placement could not be confirmed due to current cardiac rhythm.   Anti-infectives: Anti-infectives (From admission, onward)    Start     Dose/Rate Route Frequency Ordered Stop   12/04/20 2200  ceFEPIme (MAXIPIME) 2 g in sodium chloride 0.9 % 100 mL IVPB        2 g 200 mL/hr over 30 Minutes Intravenous Every 8 hours 12/04/20 1327     12/04/20 2200  metroNIDAZOLE (FLAGYL) IVPB 500 mg        500 mg 100 mL/hr over 60 Minutes Intravenous Every 12 hours 12/04/20 1327     12/02/20 1400  linezolid (ZYVOX) IVPB 600 mg  Status:  Discontinued        600 mg 300 mL/hr over 60 Minutes Intravenous Every 12 hours 12/02/20 1138 12/04/20 1327   11/30/20 1500  vancomycin (VANCOREADY) IVPB 1250 mg/250 mL  Status:  Discontinued        1,250 mg 166.7 mL/hr over 90 Minutes Intravenous Every 12 hours 11/30/20 0606 12/02/20 1138   11/30/20 0600  piperacillin-tazobactam (ZOSYN) IVPB 3.375 g  Status:  Discontinued        3.375 g 12.5 mL/hr over 240 Minutes Intravenous Every 8 hours 11/30/20 0237 12/04/20 1327   11/30/20 0403  piperacillin-tazobactam (ZOSYN) 3.375  GM/50ML IVPB       Note to Pharmacy: Hedy Camara   : cabinet override      11/30/20 0403 12/01/20 0206   11/30/20 0230  clindamycin (CLEOCIN) IVPB 600 mg  Status:  Discontinued        600 mg 100 mL/hr over 30 Minutes Intravenous  Once 11/30/20 0219 11/30/20 0237   11/30/20 0000  ceFEPIme (MAXIPIME) 2 g in sodium chloride 0.9 % 100 mL IVPB        2 g 200 mL/hr over 30 Minutes Intravenous  Once 11/29/20 2347 11/30/20 0033   11/30/20 0000  vancomycin (VANCOREADY) IVPB 1500 mg/300 mL        1,500 mg 150 mL/hr over 120 Minutes Intravenous  Once 11/29/20 2347 11/30/20 0300   11/29/20 2345  vancomycin (VANCOCIN) IVPB 1000 mg/200 mL premix  Status:  Discontinued        1,000 mg 200 mL/hr over 60 Minutes Intravenous  Once 11/29/20 2340 11/29/20 2347   11/29/20 2345  ceFEPIme (MAXIPIME) 1 g in sodium chloride 0.9 % 100 mL IVPB  Status:  Discontinued        1 g 200 mL/hr over 30 Minutes Intravenous  Once 11/29/20 2340 11/29/20 2347        Assessment/Plan POD 6 s/p EUA, rectal wall bx, I&D of perirectal, scrotal, penile and abdominal regions for Fournier's gangrene - Dr. Johney Maine w/ Dr. Alinda Money - 11/30/20 POD 5 s/p I&D of perirectal, scrotal, penile and abdominal regions for Fournier's gangrene, rectal stricturing/possible cancer - Dr. Johney Maine and Dr. Alinda Money - 12/01/20 POD 2 s/p EUA w/ I&D for Fournier's gangrene - Dr. Hassell Done and Dr. Alinda Money - 12/04/20 - Wounds clean. No further debridement indicated at this time - BID WTD - [place] for periwound fungal infection around right perineum incision . Avoid tape. Keep periwound clean and dry - Cont abx.Cx w/ E. Coli, Enterobacter cloacae, Bacteroides Ovatus, Beta Lactamase + - Cont foley per urology - Appreciate CCM assistance  Adenocarcinoma of the rectum  Rectal Stricturing - Noted from bx of rectal wall - GI consulted for colonoscopy. GI not recommending colonoscopy at this time as patient is still on vasopressors but will re-visit next week - Stool  is bulking up. Would avoid rectal tube for now after discussion with Dr. Johney Maine - Dr. Hassell Done recommends delaying diversion for now to allow abdominal wounds to heal  - Will add on CEA - Oncology consult  - Has had CT A/P during admission. Likely will need CT chest before discharge to r/o mets   FEN - Reg VTE - SCDs, Lovenox  ID - Cefepime/Flagyl Foley - in place  Shock -  still on pressors.  Tobacco use ABL anemia w/ + FOBT - hgb stable at 8.1. On iron.    LOS: 6 days    Jillyn Ledger , [facility] 12/06/2020, 2:36 PM Please see Amion for pager number during day hours 7:00am-4:30pm

## 2020-12-06 NOTE — TOC Progression Note (Signed)
Transition of Care [facility]) - Progression Note    Patient Details  Name: Kadeem Hyle MRN: [id number] Date of Birth: June 25, 1970  Transition of Care [facility] CM/SW Contact  Leeroy Cha, RN Phone Number: 12/06/2020, 8:10 AM  Clinical Narrative:    Significant Hospital Events:  6/25 I&D and debridement. Central line placement 6/26 Second I&D 6/28 Hypokalemic overnight, Levophed slightly increased to 64mcg 6/29 third I&D 6/30 remains on levo, great urine output. Received one unit PRBC overnight for hgb 7.1 7/1 PICC line placed and CVC removed overnight, pressor requirement decreased with addition of midodrine increase dose today   toc plan of care: following for progression.   Expected Discharge Plan: [place] Barriers to Discharge: Continued Medical Work up  Expected Discharge Plan and Services Expected Discharge Plan: [place]   Discharge Planning Services: CM Consult   Living arrangements for the past 2 months: Apartment                                       Social Determinants of Health (SDOH) Interventions    Readmission Risk Interventions No flowsheet data found.

## 2020-12-06 NOTE — Progress Notes (Signed)
  Echocardiogram 2D Echocardiogram has been performed.  James Coffey 12/06/2020, 12:15 PM

## 2020-12-06 NOTE — Progress Notes (Addendum)
12/06/2020  Attending Note Seen in f/u for shock suspect due to Fournier's Remains on pressors Pain is under control No SOB/N/V On exam, clear lungs, abdomen soft Perineal wound exam per surgery Hgb stable WBC mildly elevated stable No new BMP Iron panel c/w deficiency  A: Ongoing shock ?lack of source control, should r/o other etiologies Iron def anemia Rectal carcinoma complicated by Fournier's; urology and surgery following; eventual plan for diversion depending on how abdominal wounds heal  P: Post op wound care and further operative plans per CCS and urology Check echo, cortisol Increase midodrine Mobility as able Wean levophed to maintain MAP > 65  34 min cc time Erskine Emery MD PCCM     NAME:  James Coffey, MRN:  [id number], DOB:  1970/07/16, LOS: 6 ADMISSION DATE:  11/29/2020, CONSULTATION DATE:  11/30/20 REFERRING MD:  Dr Georgette Dover, CHIEF COMPLAINT: Septic shock secondary to Fournier's gangrene  History of Present Illness:  50yo male was admitted with Fournier's gangrene Post I&D and debridement x3  Post I&D remained hypotensive, for which PCCM was consulted for management of septic shock  Pertinent  Medical History  NA  Significant Hospital Events:  6/25 I&D and debridement. Central line placement  6/26 Second I&D 6/28 Hypokalemic overnight, Levophed slightly increased to 23mcg 6/29 third I&D 6/30 remains on levo, great urine output. Received one unit PRBC overnight for hgb 7.1 7/1 PICC line placed and CVC removed overnight, pressor requirement decreased with addition of midodrine increase dose today   Cefepime 6/24, 6/29 Zosyn 6/24>> 6/29 Vancomycin 6/24>> 6/26  Interim History / Subjective:  Some brief hypoxia overnight post CVC removal but no other acute issues States he feels well with controlled pain  Continues with loose stools   Objective   Blood pressure (!) 95/54, pulse 76, temperature 98.7 F (37.1 C), temperature source Axillary, resp. rate 20,  weight 75.1 kg, SpO2 97 %.        Intake/Output Summary (Last 24 hours) at 12/06/2020 0706 Last data filed at 12/06/2020 0547 Gross per 24 hour  Intake 1447.9 ml  Output 4325 ml  Net -2877.1 ml    Filed Weights   11/30/20 0800  Weight: 75.1 kg   Physical Exam General: Acute ill appearing elderly male lying in bed in NAD HEENT: [place]/AT, MM pink/moist, PERRL,  Neuro: Alert and oriented x3, non-focal CV: s1s2 regular rate and rhythm, no murmur, rubs, or gallops,  PULM:  Clear to ascultation bilaterally, no increased work of breathing, no added breath sounds GI: soft, bowel sounds active in all 4 quadrants, non-tender, non-distended, tolerating oral diet Extremities: warm/dry, no edema  Skin: no rashes or lesions  Labs/imaging that I have personally reviewed   6/25 CT ABD pelvis > Worsening perineal inflammation, including right peri-rectal. Significant progression of soft tissue gas, now extending anteriorly into the perineum, base of the penis, both scrotal sacs and anterior abdominal wall. Findings are highly suspicious for necrotizing soft tissue infection, and urgent surgical consult is recommended.  Irregular presacral nodule measuring 2.2 cm may represent an enlarged lymph node but is nonspecific. Recommend follow-up after course of treatment. There is rectal wall thickening which may be inflammatory, however neoplasm is not excluded  Resolved Hospital Problem list   AKI  Assessment & Plan:  Septic shock secondary to Fournier's Gangrene of the perirectal, scrotal, penile and abdominal regions - improving -S/p incision and drainage surgical debridement on 6/25, 6/26, 6/29 -Surgery and urology following -White blood cell count beginning to downtrend P: Stop IV  hydration, encourage oral intake  Continue scheduled oxy  Daily assessment of ability to decreased opoid use  Wounds care Increased Midodrine  Continue IV Cefepime and Flagyl   Adenocarcinoma of the rectum -Noted during  debridement of Fouriner's gangrene  P: GI consulted, appreciate assistance  Colonoscopy pending stability Per patient request he has requested clinical staff not to tell family cancer diagnosis   Tobacco use disorder P: Nicotine patch   Normocytic anemia with mixed blood loss anemia  -Hemoglobin slowly downtrending from 12.8 four days ago to 7.5 today, likely secondary to ABLA and anemia of critical illness P: Iron supplementation Hgb remains stable  Trend CBC   Best Practice   Diet/type: dysphagia diet (see orders) DVT prophylaxis: Lovenox GI prophylaxis: N/A Lines: Central line Foley:  Yes, and it is still needed Code Status:  full code Last date of multidisciplinary goals of care discussion Per primary   CRITICAL CARE Performed by: Whitney D. Harris  Total critical care time: 34  minutes  Critical care time was exclusive of separately billable procedures and treating other patients.  Critical care was necessary to treat or prevent imminent or life-threatening deterioration.  Critical care was time spent personally by me on the following activities: development of treatment plan with patient and/or surrogate as well as nursing, discussions with consultants, evaluation of patient's response to treatment, examination of patient, obtaining history from patient or surrogate, ordering and performing treatments and interventions, ordering and review of laboratory studies, ordering and review of radiographic studies, pulse oximetry and re-evaluation of patient's condition.  Whitney D. Kenton Kingfisher, NP-C [facility] Personal contact information Coffey be found on Amion  12/06/2020, 7:06 AM

## 2020-12-07 ENCOUNTER — Encounter (HOSPITAL_COMMUNITY): Payer: Self-pay

## 2020-12-07 DIAGNOSIS — E440 Moderate protein-calorie malnutrition: Secondary | ICD-10-CM

## 2020-12-07 DIAGNOSIS — Z720 Tobacco use: Secondary | ICD-10-CM

## 2020-12-07 LAB — CBC
HCT: 28.1 % — ABNORMAL LOW (ref 39.0–52.0)
Hemoglobin: 8.7 g/dL — ABNORMAL LOW (ref 13.0–17.0)
MCH: 28.7 pg (ref 26.0–34.0)
MCHC: 31 g/dL (ref 30.0–36.0)
MCV: 92.7 fL (ref 80.0–100.0)
Platelets: 489 10*3/uL — ABNORMAL HIGH (ref 150–400)
RBC: 3.03 MIL/uL — ABNORMAL LOW (ref 4.22–5.81)
RDW: 17.5 % — ABNORMAL HIGH (ref 11.5–15.5)
WBC: 13.1 10*3/uL — ABNORMAL HIGH (ref 4.0–10.5)
nRBC: 0.3 % — ABNORMAL HIGH (ref 0.0–0.2)

## 2020-12-07 LAB — GLUCOSE, CAPILLARY
Glucose-Capillary: 140 mg/dL — ABNORMAL HIGH (ref 70–99)
Glucose-Capillary: 146 mg/dL — ABNORMAL HIGH (ref 70–99)
Glucose-Capillary: 137 mg/dL — ABNORMAL HIGH (ref 70–99)
Glucose-Capillary: 156 mg/dL — ABNORMAL HIGH (ref 70–99)
Glucose-Capillary: 164 mg/dL — ABNORMAL HIGH (ref 70–99)

## 2020-12-07 MED ORDER — HYDROCORTISONE NA SUCCINATE PF 100 MG IJ SOLR
50.0000 mg | Freq: Four times a day (QID) | INTRAMUSCULAR | Status: DC
  Administered 2020-12-07 – 2020-12-08 (×4): 50 mg via INTRAVENOUS
  Filled 2020-12-07 (×4): qty 2

## 2020-12-07 NOTE — Progress Notes (Signed)
3 Days Post-Op  Subjective: CC: Feels that each day continues to be better.  Reports pain is well controlled.  Objective: Vital signs in last 24 hours: Temp:  [98.3 F (36.8 C)-99.3 F (37.4 C)] 98.4 F (36.9 C) (07/02 0800) Pulse Rate:  [67-104] 73 (07/02 0645) Resp:  [14-27] 18 (07/02 0645) BP: (83-129)/(41-113) 97/68 (07/02 0645) SpO2:  [91 %-99 %] 95 % (07/02 0645) Last BM Date: 12/05/20  Intake/Output from previous day: 07/01 0701 - 07/02 0700 In: 1130.1 [I.V.:558.7; IV Piggyback:571.4] Out: 2450 [Urine:2450] Intake/Output this shift: No intake/output data recorded.  PE: Gen: Awake and alert, pleasant Lungs: Normal rate and effort Heart: Reg Wounds on the abdominal wall are without any surrounding cellulitis and have viable granulation tissue forming.  Perineal wound not examined at this time.  Scrotal edema noted. Foley in place.  Abdominal overview (photos from 0/01/6760)  Infra-umbilical wound  Suprapubic wound  LLQ wound  RLQ wound  Perineal wound     Lab Results:  Recent Labs    12/06/20 0548 12/07/20 0500  WBC 12.8* 13.1*  HGB 8.1* 8.7*  HCT 25.8* 28.1*  PLT 436* 489*    BMET Recent Labs    12/05/20 0335  NA 138  K 4.0  CL 105  CO2 27  GLUCOSE 171*  BUN 9  CREATININE 0.63  CALCIUM 8.2*    PT/INR No results for input(s): LABPROT, INR in the last 72 hours. CMP     Component Value Date/Time   NA 138 12/05/2020 0335   K 4.0 12/05/2020 0335   CL 105 12/05/2020 0335   CO2 27 12/05/2020 0335   GLUCOSE 171 (H) 12/05/2020 0335   BUN 9 12/05/2020 0335   CREATININE 0.63 12/05/2020 0335   CALCIUM 8.2 (L) 12/05/2020 0335   PROT 8.0 11/29/2020 2302   ALBUMIN 3.2 (L) 11/29/2020 2302   AST 20 11/29/2020 2302   ALT 23 11/29/2020 2302   ALKPHOS 116 11/29/2020 2302   BILITOT 1.1 11/29/2020 2302   GFRNONAA >60 12/05/2020 0335   Lipase     Component Value Date/Time   LIPASE 22 11/03/2020 1714        Studies/Results: ECHOCARDIOGRAM COMPLETE  Result Date: 12/06/2020    ECHOCARDIOGRAM REPORT   Patient Name:   James Coffey Date of Exam: 12/06/2020 Medical Rec #:  [id number]    Height:       72.0 in Accession #:    [id number]   Weight:       165.6 lb Date of Birth:  07-12-1970    BSA:          1.966 m Patient Age:    50 years     BP:           107/64 mmHg Patient Gender: M            HR:           102 bpm. Exam Location:  Inpatient Procedure: 2D Echo, Cardiac Doppler and Color Doppler Indications:    I42.9 Cardiomyopathy (unspecified)  History:        Patient has no prior history of Echocardiogram examinations.  Sonographer:    Jonelle Sidle Dance Referring Phys: [id number] [place]  1. Left ventricular ejection fraction, by estimation, is 55 to 60%. The left ventricle has normal function. The left ventricle has no regional wall motion abnormalities. Left ventricular diastolic parameters were normal.  2. Right ventricular systolic function is normal. The right ventricular size is normal.  3. The mitral valve is grossly normal. Trivial mitral valve regurgitation.  4. The aortic valve is grossly normal. Aortic valve regurgitation is not visualized. FINDINGS  Left Ventricle: Left ventricular ejection fraction, by estimation, is 55 to 60%. The left ventricle has normal function. The left ventricle has no regional wall motion abnormalities. The left ventricular internal cavity size was normal in size. There is  no left ventricular hypertrophy. Left ventricular diastolic parameters were normal. Right Ventricle: The right ventricular size is normal. Right vetricular wall thickness was not well visualized. Right ventricular systolic function is normal. Left Atrium: Left atrial size was normal in size. Right Atrium: Right atrial size was normal in size. Pericardium: There is no evidence of pericardial effusion. Mitral Valve: The mitral valve is grossly normal. Trivial mitral valve regurgitation. Tricuspid  Valve: The tricuspid valve is normal in structure. Tricuspid valve regurgitation is trivial. Aortic Valve: The aortic valve is grossly normal. Aortic valve regurgitation is not visualized. Pulmonic Valve: The pulmonic valve was normal in structure. Pulmonic valve regurgitation is not visualized. Aorta: The aortic root and ascending aorta are structurally normal, with no evidence of dilitation. IAS/Shunts: The atrial septum is grossly normal.  LEFT VENTRICLE PLAX 2D LVIDd:         5.00 cm  Diastology LVIDs:         3.70 cm  LV e' medial:    10.60 cm/s LV PW:         1.00 cm  LV E/e' medial:  6.5 LV IVS:        1.10 cm  LV e' lateral:   16.40 cm/s LVOT diam:     2.00 cm  LV E/e' lateral: 4.2 LV SV:         58 LV SV Index:   29 LVOT Area:     3.14 cm  RIGHT VENTRICLE             IVC RV Basal diam:  3.20 cm     IVC diam: 1.70 cm RV Mid diam:    2.10 cm RV S prime:     14.10 cm/s TAPSE (M-mode): 2.3 cm LEFT ATRIUM             Index       RIGHT ATRIUM           Index LA diam:        3.50 cm 1.78 cm/m  RA Area:     15.00 cm LA Vol (A2C):   64.5 ml 32.81 ml/m RA Volume:   37.90 ml  19.28 ml/m LA Vol (A4C):   42.9 ml 21.82 ml/m LA Biplane Vol: 53.1 ml 27.01 ml/m  AORTIC VALVE LVOT Vmax:   125.00 cm/s LVOT Vmean:  75.900 cm/s LVOT VTI:    0.184 m  AORTA Ao Root diam: 3.00 cm Ao Asc diam:  3.20 cm MITRAL VALVE MV Area (PHT): 4.68 cm    SHUNTS MV Decel Time: 162 msec    Systemic VTI:  0.18 m MV E velocity: 69.20 cm/s  Systemic Diam: 2.00 cm MV A velocity: 58.10 cm/s MV E/A ratio:  1.19 Mertie Moores MD Electronically signed by Mertie Moores MD Signature Date/Time: 12/06/2020/2:58:19 PM    Final    [place] EKG SITE RITE  Result Date: 12/05/2020 If Site Rite image not attached, placement could not be confirmed due to current cardiac rhythm.   Anti-infectives: Anti-infectives (From admission, onward)    Start     Dose/Rate Route Frequency Ordered Stop  12/04/20 2200  ceFEPIme (MAXIPIME) 2 g in sodium chloride 0.9 % 100  mL IVPB        2 g 200 mL/hr over 30 Minutes Intravenous Every 8 hours 12/04/20 1327     12/04/20 2200  metroNIDAZOLE (FLAGYL) IVPB 500 mg        500 mg 100 mL/hr over 60 Minutes Intravenous Every 12 hours 12/04/20 1327     12/02/20 1400  linezolid (ZYVOX) IVPB 600 mg  Status:  Discontinued        600 mg 300 mL/hr over 60 Minutes Intravenous Every 12 hours 12/02/20 1138 12/04/20 1327   11/30/20 1500  vancomycin (VANCOREADY) IVPB 1250 mg/250 mL  Status:  Discontinued        1,250 mg 166.7 mL/hr over 90 Minutes Intravenous Every 12 hours 11/30/20 0606 12/02/20 1138   11/30/20 0600  piperacillin-tazobactam (ZOSYN) IVPB 3.375 g  Status:  Discontinued        3.375 g 12.5 mL/hr over 240 Minutes Intravenous Every 8 hours 11/30/20 0237 12/04/20 1327   11/30/20 0403  piperacillin-tazobactam (ZOSYN) 3.375 GM/50ML IVPB       Note to Pharmacy: Hedy Camara   : cabinet override      11/30/20 0403 12/01/20 0206   11/30/20 0230  clindamycin (CLEOCIN) IVPB 600 mg  Status:  Discontinued        600 mg 100 mL/hr over 30 Minutes Intravenous  Once 11/30/20 0219 11/30/20 0237   11/30/20 0000  ceFEPIme (MAXIPIME) 2 g in sodium chloride 0.9 % 100 mL IVPB        2 g 200 mL/hr over 30 Minutes Intravenous  Once 11/29/20 2347 11/30/20 0033   11/30/20 0000  vancomycin (VANCOREADY) IVPB 1500 mg/300 mL        1,500 mg 150 mL/hr over 120 Minutes Intravenous  Once 11/29/20 2347 11/30/20 0300   11/29/20 2345  vancomycin (VANCOCIN) IVPB 1000 mg/200 mL premix  Status:  Discontinued        1,000 mg 200 mL/hr over 60 Minutes Intravenous  Once 11/29/20 2340 11/29/20 2347   11/29/20 2345  ceFEPIme (MAXIPIME) 1 g in sodium chloride 0.9 % 100 mL IVPB  Status:  Discontinued        1 g 200 mL/hr over 30 Minutes Intravenous  Once 11/29/20 2340 11/29/20 2347        Assessment/Plan POD 7 s/p EUA, rectal wall bx, I&D of perirectal, scrotal, penile and abdominal regions for Fournier's gangrene - Dr. Johney Maine w/ Dr. Alinda Money -  11/30/20 POD 6 s/p I&D of perirectal, scrotal, penile and abdominal regions for Fournier's gangrene, rectal stricturing/possible cancer - Dr. Johney Maine and Dr. Alinda Money - 12/01/20 POD 3 s/p EUA w/ I&D for Fournier's gangrene - Dr. Hassell Done and Dr. Alinda Money - 12/04/20 - Wounds clean. No further debridement indicated at this time - BID WTD - [place] for periwound fungal infection around right perineum incision . Avoid tape. Keep periwound clean and dry - Cont abx.Cx w/ E. Coli, Enterobacter cloacae, Bacteroides Ovatus, Beta Lactamase + - Cont foley per urology - Appreciate CCM assistance  Adenocarcinoma of the rectum  Rectal Stricturing - Noted from bx of rectal wall - GI consulted for colonoscopy. GI not recommending colonoscopy at this time as patient is still on vasopressors but will re-visit next week - Stool is bulking up. Would avoid rectal tube for now after discussion with Dr. Johney Maine - Dr. Hassell Done recommends delaying diversion for now to allow abdominal wounds to heal  - Will add  on CEA - Oncology consult  - Has had CT A/P during admission. Likely will need CT chest before discharge to r/o mets   FEN - Reg VTE - SCDs, Lovenox  ID - Cefepime/Flagyl Foley - in place  Shock - still on pressors.  Tobacco use ABL anemia w/ + FOBT - hgb stable at 8.1. On iron.    LOS: 7 days    James Coffey , MD [facility] 12/07/2020, 8:14 AM Please see Amion for pager number during day hours 7:00am-4:30pm

## 2020-12-07 NOTE — Progress Notes (Addendum)
NAME:  James Coffey, MRN:  [id number], DOB:  1971/02/14, LOS: 7 ADMISSION DATE:  11/29/2020, CONSULTATION DATE:  11/30/20 REFERRING MD:  Dr Georgette Dover, CHIEF COMPLAINT: Septic shock secondary to Fournier's gangrene  History of Present Illness:  50yo male was admitted with Fournier's gangrene. Post I&D and debridement x3  Post I&D remained hypotensive, for which PCCM was consulted for management of septic shock  Pertinent  Medical History  Tobacco Abuse   Significant Hospital Events:  6/25 I&D and debridement. Central line placement.  CT with 6/25 worsening perineal inflammation, including right peri-rectal. Significant progression of soft tissue gas, now extending anteriorly into the perineum, base of the penis, both scrotal sacs and anterior abdominal wall.  There is rectal wall thickening which may be inflammatory, however neoplasm is not excluded.   6/26 Second I&D 6/28 Hypokalemic overnight, Levophed slightly increased to 9mcg 6/29 third I&D, zosyn stopped (6/24 >6/29), cefepime 6/30 remains on levo, great urine output. Received one unit PRBC overnight for hgb 7.1 7/1 Improved pain control, issues with wound care / loose stools   Interim History / Subjective:  7/2 Remains on 6 mcg levophed. Pt reports stool improved, less pain overall. States low appetite.   On RA  Glucose range 137-146  Afebrile / WBC 13.1  UOP 2.4L, -1.3L in last 24 hours   Objective   Blood pressure (!) 90/47, pulse 85, temperature 98.4 F (36.9 C), temperature source Oral, resp. rate (!) 26, weight 75.1 kg, SpO2 96 %.        Intake/Output Summary (Last 24 hours) at 12/07/2020 1135 Last data filed at 12/07/2020 0900 Gross per 24 hour  Intake 479.84 ml  Output 2900 ml  Net -2420.16 ml   Filed Weights   11/30/20 0800  Weight: 75.1 kg   Physical Exam General: ill appearing adult male lying in bed in NAD HEENT: MM pink/moist, anicteric  Neuro: AAOx4, speech clear, MAE  CV: s1s2 RRR, no m/r/g PULM:  non-labored on RA, lungs clear bilaterally  GI: soft, bsx4 active, abd dressing intact, tolerating PO's Extremities: warm/dry, no edema  Skin: no rashes or lesions  Labs/imaging that I have personally reviewed     Resolved Hospital Problem list   AKI  Assessment & Plan:   Septic shock secondary to Fournier's Gangrene of the perirectal, scrotal, penile and abdominal regions S/p incision and drainage surgical debridement on 6/25, 6/26, 6/29.  Culture positive for E-Coli, Enterobacter, Bacteroides -appreciate CCS, Urology assistance with patient care  -wound/post operative care per CCS/Urology  -continue cefepime, flagyl  -midodrine 10 mg TID -encourage oral hydration  -PO oxycodone for pain control + PRN fentanyl  -cortisol noted, will add stress dose steroids given overall clinical improvement and need for ongoing vasopressors.  Wean to off quickly if BP responds.   Adenocarcinoma of the rectum Noted from rectal wall biopsy during debridement for Fouriner's gangrene  -appreciate GI evaluation  -will need colonoscopy once patient stable  -avoid rectal tube per CCS recommendations  -likely will need CT chest at some point to r/o metastatic involvement   Tobacco use disorder -continue nicotine patch  -cessation counseling   Normocytic anemia with mixed blood loss anemia  Suspect secondary to ABLA and anemia of critical illness -follow WBC trend  -transfuse for Hgb <7% or active bleeding   Best Practice   Diet/type: Regular consistency (see orders) DVT prophylaxis: Lovenox GI prophylaxis: N/A Lines: Central line Foley:  Yes, and it is still needed Code Status:  full code Last date  of multidisciplinary goals of care discussion: pending   This patient is critically ill with multiple organ system failure which requires frequent high complexity decision making, assessment, support, evaluation, and titration of therapies. This was completed through the application of advanced  monitoring technologies and extensive interpretation of multiple databases. During this encounter critical care time was devoted to patient care services described in this note for 33 minutes.  Noe Gens, MSN, APRN, NP-C, AGACNP-BC [facility] 12/07/2020, 11:35 AM   Please see [email or web address] for pager details.   From 7A-7P if no response, please call [phone] After hours, please call ELink [phone]

## 2020-12-07 NOTE — Progress Notes (Signed)
NAME:  James Coffey, MRN:  [id number], DOB:  07-09-1970, LOS: 7 ADMISSION DATE:  11/29/2020, CONSULTATION DATE:  11/30/20 REFERRING MD:  Dr Georgette Dover, CHIEF COMPLAINT: Septic shock secondary to Fournier's gangrene  History of Present Illness:  50yo male was admitted with Fournier's gangrene Post I&D and debridement x3  Post I&D remained hypotensive, for which PCCM was consulted for management of septic shock  Pertinent  Medical History  NA  Significant Hospital Events:  6/25 I&D and debridement. Central line placement  6/26 Second I&D 6/28 Hypokalemic overnight, Levophed slightly increased to 43mcg 6/29 third I&D 6/30 remains on levo, great urine output. Received one unit PRBC overnight for hgb 7.1 7/1 PICC line placed and CVC removed overnight, pressor requirement decreased with addition of midodrine D/c'd levophed   Antibiotics: Cefepime 6/24, 6/29 Zosyn 6/24>> 6/29 Vancomycin 6/24>> 6/26 Flagyl  6/29 >>> Maxepime 6/29 >>>   Scheduled Meds:  (feeding supplement) PROSource Plus  30 mL Oral BID BM   vitamin C  500 mg Oral BID   Chlorhexidine Gluconate Cloth  6 each Topical Daily   enoxaparin (LOVENOX) injection  40 mg Subcutaneous Daily   feeding supplement  237 mL Oral BID BM   ferrous sulfate  325 mg Oral Q breakfast   hydrocortisone sod succinate (SOLU-CORTEF) inj  50 mg Intravenous Q6H   insulin aspart  1-3 Units Subcutaneous TID WC   lip balm  1 application Topical BID   liver oil-zinc oxide   Topical BID   mouth rinse  15 mL Mouth Rinse BID   midodrine  10 mg Oral TID WC   multivitamin with minerals  1 tablet Oral Daily   nicotine  21 mg Transdermal Daily   nutrition supplement (JUVEN)  1 packet Oral BID BM   oxyCODONE  5 mg Oral Q4H   pantoprazole  40 mg Oral QHS   sodium chloride flush  10-40 mL Intracatheter Q12H   zinc sulfate  220 mg Oral Daily   Continuous Infusions:  sodium chloride Stopped (12/05/20 1806)   sodium chloride Stopped (12/04/20 1100)    ceFEPime (MAXIPIME) IV Stopped (12/08/20 0546)   metronidazole 500 mg (12/08/20 0918)   norepinephrine (LEVOPHED) Adult infusion 1 mcg/min (12/08/20 0918)   PRN Meds:.acetaminophen, fentaNYL (SUBLIMAZE) injection, magic mouthwash, metoprolol tartrate, ondansetron **OR** ondansetron (ZOFRAN) IV, simethicone, sodium chloride flush  Interim History / Subjective:  Wound pain about the same, worse with dressing changes Still on very low doses of levophed  Objective   Blood pressure (!) 112/57, pulse 76, temperature 98.3 F (36.8 C), temperature source Axillary, resp. rate (!) 21, weight 75.1 kg, SpO2 93 %.        Intake/Output Summary (Last 24 hours) at 12/07/2020 0635 Last data filed at 12/07/2020 0500 Gross per 24 hour  Intake 775.57 ml  Output 2450 ml  Net -1674.43 ml   Filed Weights   11/30/20 0800  Weight: 75.1 kg   Physical Exam Tmax  99.3  General appearance:   plesanat wm nad   At Rest 02 sats   95 RA  No jvd Oropharynx clear,  mucosa nl Neck supple Lungs with a few scattered exp > insp rhonchi bilaterally RRR no s3 or or sign murmur Abd post op dressings intact/ po intake adequate Extr warm with no edema or clubbing noted Neuro  Sensorium alert/ approp ,  no apparent motor deficits   Labs/imaging that I have personally reviewed   6/25 CT ABD pelvis > Worsening perineal  inflammation, including right peri-rectal. Significant progression of soft tissue gas, now extending anteriorly into the perineum, base of the penis, both scrotal sacs and anterior abdominal wall. Findings are highly suspicious for necrotizing soft tissue infection, and urgent surgical consult is recommended.  Irregular presacral nodule measuring 2.2 cm may represent an enlarged lymph node but is nonspecific. Recommend follow-up after course of treatment. There is rectal wall thickening which may be inflammatory, however neoplasm is not excluded  Resolved Hospital Problem list   AKI  Assessment & Plan:   Septic shock secondary to Fournier's Gangrene of the perirectal, scrotal, penile and abdominal regions - improving -S/p incision and drainage surgical debridement on 6/25, 6/26, 6/29 -Surgery and urology following  P:  encourage oral intake  Continue scheduled oxy  Daily assessment of ability to decreased opoid use  Wounds care midodrine 10 mg TID -encourage oral hydration -PO oxycodone for pain control + PRN fentanyl  -cortisol  17.3 on 7/1 so may have poor adrenal reserve >>> try po hydrocortisone 7/3  and taper off  >>> d/c levophed am 7/3   Adenocarcinoma of the rectum -Noted during debridement of Fouriner's gangrene  P: GI consulting  Colonoscopy pending stability Per patient request he has requested clinical staff not to tell family cancer diagnosis  -avoid rectal tube per CCS recommendations -likely will need CT chest at some point to r/o metastatic involvement     Tobacco use disorder P: Nicotine patch   Normocytic anemia with mixed blood loss anemia  Lab Results  Component Value Date   HGB 8.7 (L) 12/07/2020   HGB 8.1 (L) 12/06/2020   HGB 7.6 (L) 12/05/2020    P: Iron supplementation    Ok to transfer to [facility] 7/4 if stays off levophed today.   Best Practice   Diet/type: dysphagia diet (see orders) DVT prophylaxis: Lovenox GI prophylaxis: N/A Lines: Central line Foley:  Yes, and it is still needed Code Status:  full code Last date of multidisciplinary goals of care discussion Per primary    Christinia Gully, MD [facility] and [place] [phone]   After 7:00 pm call Elink  [phone]

## 2020-12-07 NOTE — Progress Notes (Signed)
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 Patient is without complaint. Subjectively feels better.  Pain well controlled.  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 Active Problems:   Perirectal abscess   Proctitis   Sepsis ([place])   Tobacco abuse   Partial-English speaking patient - Prefers [place] or [place]   Fourniers gangrene   Stricture of rectum   Malnutrition of moderate degree   Rectal cancer ([place])  Current Facility-Administered Medications  Medication Dose Route Frequency Provider Last Rate Last Admin   (feeding supplement) PROSource Plus liquid 30 mL  30 mL Oral BID BM [name], MD   30 mL at 12/07/20 0745   0.9 %  sodium chloride infusion  250 mL Intravenous Continuous [name], MD   Stopped at 12/05/20 1806   0.9 %  sodium chloride infusion   Intravenous Continuous [name], MD   Stopped at 12/04/20 1100   acetaminophen (TYLENOL) tablet 650 mg  650 mg Oral Q6H PRN [name], MD   650 mg at 12/02/20 1622   ascorbic acid (VITAMIN C) tablet 500 mg  500 mg Oral BID [name], MD   500 mg at 12/07/20 5170   ceFEPIme (MAXIPIME) 2 g in sodium chloride 0.9 % 100 mL IVPB  2 g Intravenous Q8H MaczisBarth Kirks, PA-C   Stopped at 12/07/20 0174   Chlorhexidine Gluconate Cloth 2 % PADS 6 each  6 each Topical Daily [name], MD   6 each at 12/06/20 2144   enoxaparin (LOVENOX) injection 40 mg  40 mg Subcutaneous Daily Gerald Leitz D, NP   40 mg at 12/07/20 0924   feeding supplement (ENSURE ENLIVE / ENSURE PLUS) liquid 237 mL  237 mL Oral BID BM [name], MD   237 mL at 12/07/20 0951   fentaNYL (SUBLIMAZE) injection 25-50 mcg  25-50 mcg Intravenous Q3H PRN Gerald Leitz D, NP   50 mcg at 12/07/20 0446   ferrous  sulfate tablet 325 mg  325 mg Oral Q breakfast Gerald Leitz D, NP   325 mg at 12/07/20 0745   insulin aspart (novoLOG) injection 1-3 Units  1-3 Units Subcutaneous TID [facility] [name], MD   1 Units at 12/07/20 0745   lip balm (CARMEX) ointment 1 application  1 application Topical BID [name], MD   1 application at 94/49/67 [phone]   liver oil-zinc oxide (DESITIN) 40 % ointment   Topical BID [name], MD   Given at 12/07/20 3846   magic mouthwash  15 mL Oral QID PRN [name], MD       MEDLINE mouth rinse  15 mL Mouth Rinse BID [name], MD   15 mL at 12/06/20 2144   metoprolol tartrate (LOPRESSOR) injection 5 mg  5 mg Intravenous Q6H PRN [name], MD       metroNIDAZOLE (FLAGYL) IVPB 500 mg  500 mg Intravenous Q12H Jillyn Ledger, PA-C 100 mL/hr at 12/07/20 0926 500 mg at 12/07/20 0926   midodrine (PROAMATINE) tablet 10 mg  10 mg Oral TID WC Gerald Leitz D, NP   10 mg at 12/07/20 0745   multivitamin with minerals tablet 1 tablet  1 tablet Oral Daily [name], MD   1 tablet at 12/07/20 6599   nicotine (NICODERM CQ - dosed in mg/24 hours) patch 21  mg  21 mg Transdermal Daily [name], MD   21 mg at 12/07/20 8850   norepinephrine (LEVOPHED) 16 mg in 23mL premix infusion  0-40 mcg/min Intravenous Titrated [name], MD 5.63 mL/hr at 12/06/20 2005 6 mcg/min at 12/06/20 2005   nutrition supplement (JUVEN) (JUVEN) powder packet 1 packet  1 packet Oral BID BM [name], MD   1 packet at 12/06/20 1948   ondansetron (ZOFRAN-ODT) disintegrating tablet 4 mg  4 mg Oral Q6H PRN [name], MD       Or   ondansetron [facility]) injection 4 mg  4 mg Intravenous Q6H PRN [name], MD       oxyCODONE (Oxy IR/ROXICODONE) immediate release tablet 5 mg  5 mg Oral Q4H Gerald Leitz D, NP   5 mg at 12/07/20 0923   pantoprazole (PROTONIX) EC tablet 40 mg  40 mg Oral QHS Gerald Leitz D, NP   40 mg at 12/06/20 2143   simethicone (MYLICON)  chewable tablet 40 mg  40 mg Oral Q6H PRN [name], MD       sodium chloride flush (NS) 0.9 % injection 10-40 mL  10-40 mL Intracatheter Q12H [name], MD   10 mL at 12/07/20 0924   sodium chloride flush (NS) 0.9 % injection 10-40 mL  10-40 mL Intracatheter PRN [name], MD       sodium chloride flush (NS) 0.9 % injection 10-40 mL  10-40 mL Intracatheter Q12H Noemi Chapel P, DO   10 mL at 12/07/20 2774   sodium chloride flush (NS) 0.9 % injection 10-40 mL  10-40 mL Intracatheter PRN Noemi Chapel P, DO       zinc sulfate capsule 220 mg  220 mg Oral Daily [name], MD   220 mg at 12/06/20 1417     Objective: Vital: Vitals:   12/07/20 0615 12/07/20 0630 12/07/20 0645 12/07/20 0800  BP: 97/68 110/63 97/68 (!) 90/47  Pulse: 84 79 73 85  Resp: 18 (!) 23 18 (!) 26  Temp:    98.4 F (36.9 C)  TempSrc:    Oral  SpO2: 96% 96% 95% 96%  Weight:       I/Os: I/O last 3 completed shifts: In: 1539.9 [I.V.:772.9; IV JOINOMVEH:209] Out: 4709 [Urine:4450]  Physical Exam:  General: Patient is in no apparent distress Lungs: Normal respiratory effort, chest expands symmetrically. GI: The abdomen is soft and nontender without mass. Foley: Foley catheter draining clear yellow urine.  Perineal wound with viable granulation tissue forming.  There is some scrotal edema.  No necrotic tissue. Ext: lower extremities symmetric  Lab Results: Recent Labs    12/05/20 0335 12/06/20 0548 12/07/20 0500  WBC 12.6* 12.8* 13.1*  HGB 7.6* 8.1* 8.7*  HCT 24.2* 25.8* 28.1*   Recent Labs    12/05/20 0335  NA 138  K 4.0  CL 105  CO2 27  GLUCOSE 171*  BUN 9  CREATININE 0.63  CALCIUM 8.2*   No results for input(s): LABPT, INR in the last 72 hours. No results for input(s): LABURIN in the last 72 hours. Results for orders placed or performed during the hospital encounter of 11/29/20  Blood culture (routine x 2)     Status: Abnormal   Collection Time: 11/29/20 11:42 PM    Specimen: BLOOD  Result Value Ref Range Status   Specimen Description   Final    BLOOD LEFT ARM Performed at [facility] [address] [place], [place] [address]  Special Requests   Final    BOTTLES DRAWN AEROBIC AND ANAEROBIC Blood Culture adequate volume Performed at [facility] [address] [place], [place] [address]    Culture  Setup Time (A)  Final    GRAM VARIABLE ROD ANAEROBIC BOTTLE ONLY CRITICAL RESULT CALLED TO, READ BACK BY AND VERIFIED WITH: PHARMD J.LEGGE AT 3664 ON 12/02/2020 BY T.SAAD.    Culture (A)  Final    BACTEROIDES UNIFORMIS BETA LACTAMASE POSITIVE Performed at [facility], [place] [address] [place], [place] [address]    Report Status 12/05/2020 FINAL  Final  Blood Culture ID Panel (Reflexed)     Status: None   Collection Time: 11/29/20 11:42 PM  Result Value Ref Range Status   Enterococcus faecalis NOT DETECTED NOT DETECTED Final   Enterococcus Faecium NOT DETECTED NOT DETECTED Final   Listeria monocytogenes NOT DETECTED NOT DETECTED Final   Staphylococcus species NOT DETECTED NOT DETECTED Final   Staphylococcus aureus (BCID) NOT DETECTED NOT DETECTED Final   Staphylococcus epidermidis NOT DETECTED NOT DETECTED Final   Staphylococcus lugdunensis NOT DETECTED NOT DETECTED Final   Streptococcus species NOT DETECTED NOT DETECTED Final   Streptococcus agalactiae NOT DETECTED NOT DETECTED Final   Streptococcus pneumoniae NOT DETECTED NOT DETECTED Final   Streptococcus pyogenes NOT DETECTED NOT DETECTED Final   A.calcoaceticus-baumannii NOT DETECTED NOT DETECTED Final   Bacteroides fragilis NOT DETECTED NOT DETECTED Final   Enterobacterales NOT DETECTED NOT DETECTED Final   Enterobacter cloacae complex NOT DETECTED NOT DETECTED Final   Escherichia coli NOT DETECTED NOT DETECTED Final   Klebsiella aerogenes NOT DETECTED NOT DETECTED Final   Klebsiella oxytoca NOT DETECTED NOT DETECTED Final   Klebsiella  pneumoniae NOT DETECTED NOT DETECTED Final   Proteus species NOT DETECTED NOT DETECTED Final   Salmonella species NOT DETECTED NOT DETECTED Final   Serratia marcescens NOT DETECTED NOT DETECTED Final   Haemophilus influenzae NOT DETECTED NOT DETECTED Final   Neisseria meningitidis NOT DETECTED NOT DETECTED Final   Pseudomonas aeruginosa NOT DETECTED NOT DETECTED Final   Stenotrophomonas maltophilia NOT DETECTED NOT DETECTED Final   Candida albicans NOT DETECTED NOT DETECTED Final   Candida auris NOT DETECTED NOT DETECTED Final   Candida glabrata NOT DETECTED NOT DETECTED Final   Candida krusei NOT DETECTED NOT DETECTED Final   Candida parapsilosis NOT DETECTED NOT DETECTED Final   Candida tropicalis NOT DETECTED NOT DETECTED Final   Cryptococcus neoformans/gattii NOT DETECTED NOT DETECTED Final    Comment: Performed at [facility], [address], [place], [place] [address]  Blood culture (routine x 2)     Status: None   Collection Time: 11/29/20 11:47 PM   Specimen: BLOOD  Result Value Ref Range Status   Specimen Description   Final    BLOOD RIGHT WRIST Performed at [facility] [address] [place], [place] [address]    Special Requests   Final    BOTTLES DRAWN AEROBIC AND ANAEROBIC Blood Culture adequate volume Performed at [facility] [address], [place], [place] [address]    Culture   Final    NO GROWTH 5 DAYS Performed at [facility], [place] [address], [place], [place] [address]    Report Status 12/05/2020 FINAL  Final  Urine culture     Status: None   Collection Time: 11/30/20  2:00 AM   Specimen: Urine, Clean Catch  Result Value Ref Range Status   Specimen  Description   Final    URINE, CLEAN CATCH Performed at [facility], [place] [address], [place], [place] [address]    Special Requests   Final    URINE, CLEAN CATCH Performed at [facility], [place] [address],  [place], [place] [address]    Culture   Final    NO GROWTH Performed at [facility], [place] [address] [place], [place] [address]    Report Status 12/01/2020 FINAL  Final  Resp Panel by RT-PCR (Flu A&B, Covid) Nasopharyngeal Swab     Status: None   Collection Time: 11/30/20  2:53 AM   Specimen: Nasopharyngeal Swab; Nasopharyngeal(NP) swabs in vial transport medium  Result Value Ref Range Status   SARS Coronavirus 2 by RT PCR NEGATIVE NEGATIVE Final    Comment: (NOTE) SARS-CoV-2 target nucleic acids are NOT DETECTED.  The SARS-CoV-2 RNA is generally detectable in upper respiratory specimens during the acute phase of infection. The lowest concentration of SARS-CoV-2 viral copies this assay can detect is 138 copies/mL. A negative result does not preclude SARS-Cov-2 infection and should not be used as the sole basis for treatment or other patient management decisions. A negative result may occur with  improper specimen collection/handling, submission of specimen other than nasopharyngeal swab, presence of viral mutation(s) within the areas targeted by this assay, and inadequate number of viral copies(<138 copies/mL). A negative result must be combined with clinical observations, patient history, and epidemiological information. The expected result is Negative.  Fact Sheet for Patients:  EntrepreneurPulse.com.au  Fact Sheet for Healthcare Providers:  IncredibleEmployment.be  This test is no t yet approved or cleared by the [place] FDA and  has been authorized for detection and/or diagnosis of SARS-CoV-2 by FDA under an Emergency Use Authorization (EUA). This EUA will remain  in effect (meaning this test can be used) for the duration of the COVID-19 declaration under Section 564(b)(1) of the Act, 21 U.S.C.section 360bbb-3(b)(1), unless the authorization is terminated  or revoked sooner.       Influenza A by PCR NEGATIVE NEGATIVE Final    Influenza B by PCR NEGATIVE NEGATIVE Final    Comment: (NOTE) The Xpert Xpress SARS-CoV-2/FLU/RSV plus assay is intended as an aid in the diagnosis of influenza from Nasopharyngeal swab specimens and should not be used as a sole basis for treatment. Nasal washings and aspirates are unacceptable for Xpert Xpress SARS-CoV-2/FLU/RSV testing.  Fact Sheet for Patients: EntrepreneurPulse.com.au  Fact Sheet for Healthcare Providers: IncredibleEmployment.be  This test is not yet approved or cleared by the [place] FDA and has been authorized for detection and/or diagnosis of SARS-CoV-2 by FDA under an Emergency Use Authorization (EUA). This EUA will remain in effect (meaning this test can be used) for the duration of the COVID-19 declaration under Section 564(b)(1) of the Act, 21 U.S.C. section 360bbb-3(b)(1), unless the authorization is terminated or revoked.  Performed at [facility], [place] [address] [place], [place] [address]   Aerobic/Anaerobic Culture w Gram Stain (surgical/deep wound)     Status: None   Collection Time: 11/30/20  5:37 AM   Specimen: PATH Cytology Misc. fluid; Body Fluid  Result Value Ref Range Status   Specimen Description   Final    FLUID FOURNIERS GANGRENE Performed at [facility] [address] [place], [place], [place] [address]    Special Requests   Final    PATIENT ON FOLLOWING CEFAPINE ZOSYN VANC CLINDAMYCIN Performed at [facility]  [address] [place], [place] [address]    Gram Stain   Final    NO WBC SEEN ABUNDANT GRAM POSITIVE COCCI ABUNDANT GRAM VARIABLE ROD    Culture   Final    FEW ESCHERICHIA COLI FEW ENTEROBACTER CLOACAE FEW BACTEROIDES OVATUS BETA LACTAMASE POSITIVE Performed at [facility], [place] [address] [place], [place] [address]    Report Status 12/04/2020 FINAL  Final   Organism ID, Bacteria ESCHERICHIA COLI  Final   Organism  ID, Bacteria ENTEROBACTER CLOACAE  Final      Susceptibility   Enterobacter cloacae - MIC*    CEFAZOLIN >=64 RESISTANT Resistant     CEFEPIME <=0.12 SENSITIVE Sensitive     CEFTAZIDIME <=1 SENSITIVE Sensitive     CIPROFLOXACIN <=0.25 SENSITIVE Sensitive     GENTAMICIN <=1 SENSITIVE Sensitive     IMIPENEM 0.5 SENSITIVE Sensitive     TRIMETH/SULFA <=20 SENSITIVE Sensitive     PIP/TAZO <=4 SENSITIVE Sensitive     * FEW ENTEROBACTER CLOACAE   Escherichia coli - MIC*    AMPICILLIN <=2 SENSITIVE Sensitive     CEFAZOLIN <=4 SENSITIVE Sensitive     CEFEPIME <=0.12 SENSITIVE Sensitive     CEFTAZIDIME <=1 SENSITIVE Sensitive     CEFTRIAXONE <=0.25 SENSITIVE Sensitive     CIPROFLOXACIN <=0.25 SENSITIVE Sensitive     GENTAMICIN <=1 SENSITIVE Sensitive     IMIPENEM <=0.25 SENSITIVE Sensitive     TRIMETH/SULFA <=20 SENSITIVE Sensitive     AMPICILLIN/SULBACTAM <=2 SENSITIVE Sensitive     PIP/TAZO <=4 SENSITIVE Sensitive     * FEW ESCHERICHIA COLI    Studies/Results: ECHOCARDIOGRAM COMPLETE  Result Date: 12/06/2020    ECHOCARDIOGRAM REPORT   Patient Name:   James Coffey Date of Exam: 12/06/2020 Medical Rec #:  [id number]    Height:       72.0 in Accession #:    [id number]   Weight:       165.6 lb Date of Birth:  02-Apr-1971    BSA:          1.966 m Patient Age:    50 years     BP:           107/64 mmHg Patient Gender: M            HR:           102 bpm. Exam Location:  Inpatient Procedure: 2D Echo, Cardiac Doppler and Color Doppler Indications:    I42.9 Cardiomyopathy (unspecified)  History:        Patient has no prior history of Echocardiogram examinations.  Sonographer:    Jonelle Sidle Dance Referring Phys: [id number] [place]  1. Left ventricular ejection fraction, by estimation, is 55 to 60%. The left ventricle has normal function. The left ventricle has no regional wall motion abnormalities. Left ventricular diastolic parameters were normal.  2. Right ventricular systolic function is normal.  The right ventricular size is normal.  3. The mitral valve is grossly normal. Trivial mitral valve regurgitation.  4. The aortic valve is grossly normal. Aortic valve regurgitation is not visualized. FINDINGS  Left Ventricle: Left ventricular ejection fraction, by estimation, is 55 to 60%. The left ventricle has normal function. The left ventricle has no regional wall motion abnormalities. The left ventricular internal cavity size was normal in size. There is  no left ventricular hypertrophy. Left ventricular diastolic parameters were normal. Right Ventricle: The right ventricular size is normal. Right vetricular wall thickness was not well visualized. Right ventricular  systolic function is normal. Left Atrium: Left atrial size was normal in size. Right Atrium: Right atrial size was normal in size. Pericardium: There is no evidence of pericardial effusion. Mitral Valve: The mitral valve is grossly normal. Trivial mitral valve regurgitation. Tricuspid Valve: The tricuspid valve is normal in structure. Tricuspid valve regurgitation is trivial. Aortic Valve: The aortic valve is grossly normal. Aortic valve regurgitation is not visualized. Pulmonic Valve: The pulmonic valve was normal in structure. Pulmonic valve regurgitation is not visualized. Aorta: The aortic root and ascending aorta are structurally normal, with no evidence of dilitation. IAS/Shunts: The atrial septum is grossly normal.  LEFT VENTRICLE PLAX 2D LVIDd:         5.00 cm  Diastology LVIDs:         3.70 cm  LV e' medial:    10.60 cm/s LV PW:         1.00 cm  LV E/e' medial:  6.5 LV IVS:        1.10 cm  LV e' lateral:   16.40 cm/s LVOT diam:     2.00 cm  LV E/e' lateral: 4.2 LV SV:         58 LV SV Index:   29 LVOT Area:     3.14 cm  RIGHT VENTRICLE             IVC RV Basal diam:  3.20 cm     IVC diam: 1.70 cm RV Mid diam:    2.10 cm RV S prime:     14.10 cm/s TAPSE (M-mode): 2.3 cm LEFT ATRIUM             Index       RIGHT ATRIUM           Index LA  diam:        3.50 cm 1.78 cm/m  RA Area:     15.00 cm LA Vol (A2C):   64.5 ml 32.81 ml/m RA Volume:   37.90 ml  19.28 ml/m LA Vol (A4C):   42.9 ml 21.82 ml/m LA Biplane Vol: 53.1 ml 27.01 ml/m  AORTIC VALVE LVOT Vmax:   125.00 cm/s LVOT Vmean:  75.900 cm/s LVOT VTI:    0.184 m  AORTA Ao Root diam: 3.00 cm Ao Asc diam:  3.20 cm MITRAL VALVE MV Area (PHT): 4.68 cm    SHUNTS MV Decel Time: 162 msec    Systemic VTI:  0.18 m MV E velocity: 69.20 cm/s  Systemic Diam: 2.00 cm MV A velocity: 58.10 cm/s MV E/A ratio:  1.19 Mertie Moores MD Electronically signed by Mertie Moores MD Signature Date/Time: 12/06/2020/2:58:19 PM    Final    [place] EKG SITE RITE  Result Date: 12/05/2020 If Site Rite image not attached, placement could not be confirmed due to current cardiac rhythm.   Assessment: Fournier's gangrene   Plan: Patient has healthy granulation tissue forming.  Continue Foley catheter.  No further intervention at this time.  Continue dressing changes as you are doing.   Link Snuffer, MD Urology 12/07/2020, 10:02 AM

## 2020-12-08 LAB — CBC
HCT: 28.4 % — ABNORMAL LOW (ref 39.0–52.0)
Hemoglobin: 8.8 g/dL — ABNORMAL LOW (ref 13.0–17.0)
MCH: 28.4 pg (ref 26.0–34.0)
MCHC: 31 g/dL (ref 30.0–36.0)
MCV: 91.6 fL (ref 80.0–100.0)
Platelets: 494 10*3/uL — ABNORMAL HIGH (ref 150–400)
RBC: 3.1 MIL/uL — ABNORMAL LOW (ref 4.22–5.81)
RDW: 18 % — ABNORMAL HIGH (ref 11.5–15.5)
WBC: 10.8 10*3/uL — ABNORMAL HIGH (ref 4.0–10.5)
nRBC: 0 % (ref 0.0–0.2)

## 2020-12-08 LAB — COMPREHENSIVE METABOLIC PANEL
ALT: 23 U/L (ref 0–44)
AST: 12 U/L — ABNORMAL LOW (ref 15–41)
Albumin: 2.5 g/dL — ABNORMAL LOW (ref 3.5–5.0)
Alkaline Phosphatase: 58 U/L (ref 38–126)
Anion gap: 7 (ref 5–15)
BUN: 11 mg/dL (ref 6–20)
CO2: 26 mmol/L (ref 22–32)
Calcium: 8.9 mg/dL (ref 8.9–10.3)
Chloride: 106 mmol/L (ref 98–111)
Creatinine, Ser: 0.54 mg/dL — ABNORMAL LOW (ref 0.61–1.24)
GFR, Estimated: >60 mL/min (ref >60)
Glucose, Bld: 163 mg/dL — ABNORMAL HIGH (ref 70–99)
Potassium: 3.8 mmol/L (ref 3.5–5.1)
Sodium: 139 mmol/L (ref 135–145)
Total Bilirubin: 0.4 mg/dL (ref 0.3–1.2)
Total Protein: 6.5 g/dL (ref 6.5–8.1)

## 2020-12-08 LAB — GLUCOSE, CAPILLARY
Glucose-Capillary: 166 mg/dL — ABNORMAL HIGH (ref 70–99)
Glucose-Capillary: 203 mg/dL — ABNORMAL HIGH (ref 70–99)
Glucose-Capillary: 121 mg/dL — ABNORMAL HIGH (ref 70–99)
Glucose-Capillary: 145 mg/dL — ABNORMAL HIGH (ref 70–99)

## 2020-12-08 LAB — PREALBUMIN: Prealbumin: 11.2 mg/dL — ABNORMAL LOW (ref 18–38)

## 2020-12-08 LAB — MRSA NEXT GEN BY PCR, NASAL: MRSA by PCR Next Gen: NOT DETECTED

## 2020-12-08 MED ORDER — HYDROCORTISONE 20 MG PO TABS
40.0000 mg | ORAL_TABLET | Freq: Two times a day (BID) | ORAL | Status: DC
  Administered 2020-12-08 – 2020-12-09 (×4): 40 mg via ORAL
  Filled 2020-12-08 (×5): qty 2

## 2020-12-08 MED ORDER — CHOLESTYRAMINE 4 G PO PACK
4.0000 g | PACK | Freq: Four times a day (QID) | ORAL | Status: DC
  Administered 2020-12-08 – 2020-12-09 (×3): 4 g via ORAL
  Filled 2020-12-08 (×6): qty 1

## 2020-12-08 NOTE — Progress Notes (Signed)
[facility] Progress Note Patient Name: James Coffey DOB: [birth year] MRN: [id number]   Date of Service  12/08/2020  HPI/Events of Note  Ongoing diarrhea issue.   eICU Interventions  Plan: Questran 4 gm PO now and Q 6 hours.     Intervention Category Major Interventions: Other:  Lysle Dingwall 12/08/2020, 9:34 PM

## 2020-12-09 LAB — GLUCOSE, CAPILLARY
Glucose-Capillary: 112 mg/dL — ABNORMAL HIGH (ref 70–99)
Glucose-Capillary: 140 mg/dL — ABNORMAL HIGH (ref 70–99)
Glucose-Capillary: 125 mg/dL — ABNORMAL HIGH (ref 70–99)
Glucose-Capillary: 130 mg/dL — ABNORMAL HIGH (ref 70–99)

## 2020-12-09 MED ORDER — LACTATED RINGERS IV BOLUS
1000.0000 mL | Freq: Once | INTRAVENOUS | Status: AC
  Administered 2020-12-09: 1000 mL via INTRAVENOUS

## 2020-12-09 MED ORDER — CHOLESTYRAMINE LIGHT 4 G PO PACK
4.0000 g | PACK | Freq: Four times a day (QID) | ORAL | Status: DC
  Administered 2020-12-09 – 2020-12-19 (×39): 4 g via ORAL
  Filled 2020-12-09 (×44): qty 1

## 2020-12-09 NOTE — Progress Notes (Signed)
Pt without complaint -   Penis and scrotum look normal and healthy. No edema or cellulitis. Perineal wound is now packed and with healthy edges.   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 Urethra and corpora cavernosa exposed within his wounds which are healing and undergoing wet to dry dressings.   Continue foley. Will let Dr. Alinda Money make final call on foley d/c.

## 2020-12-09 NOTE — Progress Notes (Signed)
NAME:  James Coffey, MRN:  [id number], DOB:  April 08, 1971, LOS: 9 ADMISSION DATE:  11/29/2020, CONSULTATION DATE:  11/30/20 REFERRING MD:  Dr Georgette Dover, CHIEF COMPLAINT: Septic shock secondary to Fournier's gangrene  History of Present Illness:  50yo male was admitted with Fournier's gangrene Post I&D and debridement x3  Post I&D remained hypotensive, for which PCCM was consulted for management of septic shock  Pertinent  Medical History  NA  Significant Hospital Events:  6/25 I&D and debridement. Central line placement  6/26 Second I&D 6/28 Hypokalemic overnight, Levophed slightly increased to 23mcg 6/29 third I&D 6/30 remains on levo, great urine output. Received one unit PRBC overnight for hgb 7.1 7/1 PICC line placed and CVC removed overnight, pressor requirement decreased with addition of midodrine D/c'd levophed    Antibiotics: Cefepime 6/24, 6/29 Zosyn 6/24>> 6/29 Vancomycin 6/24>> 6/26 Flagyl  6/29 >>> Maxepime 6/29 >>>   Scheduled Meds:  (feeding supplement) PROSource Plus  30 mL Oral BID BM   vitamin C  500 mg Oral BID   Chlorhexidine Gluconate Cloth  6 each Topical Daily   cholestyramine  4 g Oral Q6H   enoxaparin (LOVENOX) injection  40 mg Subcutaneous Daily   feeding supplement  237 mL Oral BID BM   ferrous sulfate  325 mg Oral Q breakfast   hydrocortisone  40 mg Oral BID   insulin aspart  1-3 Units Subcutaneous TID WC   lip balm  1 application Topical BID   liver oil-zinc oxide   Topical BID   mouth rinse  15 mL Mouth Rinse BID   midodrine  10 mg Oral TID WC   multivitamin with minerals  1 tablet Oral Daily   nicotine  21 mg Transdermal Daily   nutrition supplement (JUVEN)  1 packet Oral BID BM   oxyCODONE  5 mg Oral Q4H   pantoprazole  40 mg Oral QHS   sodium chloride flush  10-40 mL Intracatheter Q12H   zinc sulfate  220 mg Oral Daily   Continuous Infusions:  sodium chloride 250 mL (12/09/20 0936)   ceFEPime (MAXIPIME) IV Stopped (12/09/20 6295)    metronidazole 500 mg (12/09/20 0943)   PRN Meds:.acetaminophen, fentaNYL (SUBLIMAZE) injection, magic mouthwash, metoprolol tartrate, ondansetron **OR** ondansetron (ZOFRAN) IV, simethicone, sodium chloride flush    Interim History / Subjective:  Diarrhea treated overnight by elink with [employer] spirits this am/ improving po intake and wound pain easing off some  Objective   Blood pressure 95/65, pulse 78, temperature 98.3 F (36.8 C), temperature source Oral, resp. rate 14, weight 75.1 kg, SpO2 97 %.        Intake/Output Summary (Last 24 hours) at 12/09/2020 1024 Last data filed at 12/09/2020 0650 Gross per 24 hour  Intake 420.21 ml  Output 2125 ml  Net -1704.79 ml   Filed Weights   11/30/20 0800  Weight: 75.1 kg   Physical Exam Tmax  99.1   General appearance:    more chronically than acutely ill appearing now  At Rest 02 sats  96% on RA  No jvd Oropharynx clear,  mucosa nl Neck supple Lungs with a min scattered exp > insp rhonchi bilaterally RRR no s3 or or sign murmur Abd less tender, limited excursion  Extr warm with no edema or clubbing noted Neuro  Sensorium intact,  no apparent motor deficits     Labs/imaging that I have personally reviewed   6/25 CT ABD pelvis > Worsening perineal inflammation, including right peri-rectal.  Significant progression of soft tissue gas, now extending anteriorly into the perineum, base of the penis, both scrotal sacs and anterior abdominal wall. Findings are highly suspicious for necrotizing soft tissue infection, and urgent surgical consult is recommended.  Irregular presacral nodule measuring 2.2 cm may represent an enlarged lymph node but is nonspecific. Recommend follow-up after course of treatment. There is rectal wall thickening which may be inflammatory, however neoplasm is not excluded  Resolved Hospital Problem list   AKI  Assessment & Plan:  Septic shock secondary to Fournier's Gangrene of the perirectal, scrotal,  penile and abdominal regions -  resolved, off pressors x 24 h  -S/p incision and drainage surgical debridement on 6/25, 6/26, 6/29 -Surgery and urology following  P:  Continue scheduled oxy  Daily assessment of ability to decreased opoid use  Wounds care midodrine 10 mg TID -encourage oral hydration -PO oxycodone for pain control + PRN fentanyl  -cortisol  17.3 on 7/1 so may have poor adrenal reserve >>> try po hydrocortisone 7/3  and taper off  >>> d/c'd levophed am 7/3   Adenocarcinoma of the rectum -Noted during debridement of Fouriner's gangrene  P: GI consulting  Colonoscopy pending stability Per patient request he has requested clinical staff not to tell family cancer diagnosis  -avoid rectal tube per CCS recommendations -likely will need CT chest at some point to r/o metastatic involvement     Tobacco use disorder P: Nicotine patch   Normocytic anemia with mixed blood loss anemia  Lab Results  Component Value Date   HGB 8.8 (L) 12/08/2020   HGB 8.7 (L) 12/07/2020   HGB 8.1 (L) 12/06/2020    P: Iron supplementation    Ok to transfer to [facility] 7/5    Best Practice   Diet/type: dysphagia diet (see orders) DVT prophylaxis: Lovenox GI prophylaxis: N/A Lines: Central line Foley:  Yes, and it is still needed Code Status:  full code Last date of multidisciplinary goals of care discussion Per primary     Christinia Gully, MD Pulmonary and [place] Cell [phone]   After 7:00 pm call Elink  [phone]

## 2020-12-09 NOTE — Progress Notes (Signed)
PT Cancellation Note  Patient Details Name: James Coffey MRN: [id number] DOB: 13-Mar-1971   Cancelled Treatment:    Reason Eval/Treat Not Completed: Medical issues which prohibited therapy (Pt hypotensive, 88/51 and then 10 minutes later 85/52. Acute PT will follow up at later date/time as pt is able and schedule allows.)  [name], [place] Office [phone] Pager [phone]   [name] 12/09/2020, 10:34 AM

## 2020-12-09 NOTE — Progress Notes (Signed)
5 Days Post-Op  Subjective: CC: Continues to feel better.  No complaints this morning.  Objective: Vital signs in last 24 hours: Temp:  [98 F (36.7 C)-99.1 F (37.3 C)] 99.1 F (37.3 C) (07/03 1900) Pulse Rate:  [60-92] 75 (07/04 0400) Resp:  [12-33] 22 (07/04 0400) BP: (84-117)/(49-71) 96/65 (07/04 0400) SpO2:  [92 %-99 %] 99 % (07/04 0400) Last BM Date: 12/08/20  Intake/Output from previous day: 07/03 0701 - 07/04 0700 In: 855.5 [I.V.:102.8; IV Piggyback:752.7] Out: 1400 [Urine:1400] Intake/Output this shift: Total I/O In: 220 [I.V.:20; IV Piggyback:200] Out: -   PE: Gen: Awake and alert, pleasant Lungs: Normal rate and effort Heart: Reg Wounds on the abdominal wall are without any surrounding cellulitis and are granulating nicely.  Perineal wound without any surrounding cellulitis, beginning to granulate although it is accumulating some fibrinous exudate at the base, and is not currently packed.  Scrotal edema improving.   Lab Results:  Recent Labs    12/07/20 0500 12/08/20 0500  WBC 13.1* 10.8*  HGB 8.7* 8.8*  HCT 28.1* 28.4*  PLT 489* 494*    BMET Recent Labs    12/08/20 0500  NA 139  K 3.8  CL 106  CO2 26  GLUCOSE 163*  BUN 11  CREATININE 0.54*  CALCIUM 8.9    PT/INR No results for input(s): LABPROT, INR in the last 72 hours. CMP     Component Value Date/Time   NA 139 12/08/2020 0500   K 3.8 12/08/2020 0500   CL 106 12/08/2020 0500   CO2 26 12/08/2020 0500   GLUCOSE 163 (H) 12/08/2020 0500   BUN 11 12/08/2020 0500   CREATININE 0.54 (L) 12/08/2020 0500   CALCIUM 8.9 12/08/2020 0500   PROT 6.5 12/08/2020 0500   ALBUMIN 2.5 (L) 12/08/2020 0500   AST 12 (L) 12/08/2020 0500   ALT 23 12/08/2020 0500   ALKPHOS 58 12/08/2020 0500   BILITOT 0.4 12/08/2020 0500   GFRNONAA >60 12/08/2020 0500   Lipase     Component Value Date/Time   LIPASE 22 11/03/2020 1714       Studies/Results: No results  found.  Anti-infectives: Anti-infectives (From admission, onward)    Start     Dose/Rate Route Frequency Ordered Stop   12/04/20 2200  ceFEPIme (MAXIPIME) 2 g in sodium chloride 0.9 % 100 mL IVPB        2 g 200 mL/hr over 30 Minutes Intravenous Every 8 hours 12/04/20 1327     12/04/20 2200  metroNIDAZOLE (FLAGYL) IVPB 500 mg        500 mg 100 mL/hr over 60 Minutes Intravenous Every 12 hours 12/04/20 1327     12/02/20 1400  linezolid (ZYVOX) IVPB 600 mg  Status:  Discontinued        600 mg 300 mL/hr over 60 Minutes Intravenous Every 12 hours 12/02/20 1138 12/04/20 1327   11/30/20 1500  vancomycin (VANCOREADY) IVPB 1250 mg/250 mL  Status:  Discontinued        1,250 mg 166.7 mL/hr over 90 Minutes Intravenous Every 12 hours 11/30/20 0606 12/02/20 1138   11/30/20 0600  piperacillin-tazobactam (ZOSYN) IVPB 3.375 g  Status:  Discontinued        3.375 g 12.5 mL/hr over 240 Minutes Intravenous Every 8 hours 11/30/20 0237 12/04/20 1327   11/30/20 0403  piperacillin-tazobactam (ZOSYN) 3.375 GM/50ML IVPB       Note to Pharmacy: Hedy Camara   : cabinet override      11/30/20 0403  12/01/20 0206   11/30/20 0230  clindamycin (CLEOCIN) IVPB 600 mg  Status:  Discontinued        600 mg 100 mL/hr over 30 Minutes Intravenous  Once 11/30/20 0219 11/30/20 0237   11/30/20 0000  ceFEPIme (MAXIPIME) 2 g in sodium chloride 0.9 % 100 mL IVPB        2 g 200 mL/hr over 30 Minutes Intravenous  Once 11/29/20 2347 11/30/20 0033   11/30/20 0000  vancomycin (VANCOREADY) IVPB 1500 mg/300 mL        1,500 mg 150 mL/hr over 120 Minutes Intravenous  Once 11/29/20 2347 11/30/20 0300   11/29/20 2345  vancomycin (VANCOCIN) IVPB 1000 mg/200 mL premix  Status:  Discontinued        1,000 mg 200 mL/hr over 60 Minutes Intravenous  Once 11/29/20 2340 11/29/20 2347   11/29/20 2345  ceFEPIme (MAXIPIME) 1 g in sodium chloride 0.9 % 100 mL IVPB  Status:  Discontinued        1 g 200 mL/hr over 30 Minutes Intravenous  Once  11/29/20 2340 11/29/20 2347        Assessment/Plan POD 9 s/p EUA, rectal wall bx, I&D of perirectal, scrotal, penile and abdominal regions for Fournier's gangrene - Dr. Johney Maine w/ Dr. Alinda Money - 11/30/20 POD 8 s/p I&D of perirectal, scrotal, penile and abdominal regions for Fournier's gangrene, rectal stricturing/possible cancer - Dr. Johney Maine and Dr. Alinda Money - 12/01/20 POD 5 s/p EUA w/ I&D for Fournier's gangrene - Dr. Hassell Done and Dr. Alinda Money - 12/04/20 - Wounds clean. No further debridement indicated at this time - BID WTD-please ensure that the perineal wound is being packed with a damp to dry dressing at least twice daily and after any bowel movements in order to endorse micro debridement of the accumulating fibrinous exudate - [place] for periwound fungal infection around right perineum incision . Avoid tape (use mesh undergarment). Keep periwound clean and dry - Cont abx.Cx w/ E. Coli, Enterobacter cloacae, Bacteroides Ovatus, Beta Lactamase + - Cont foley per urology - Appreciate CCM assistance -He needs to be getting up out of bed and moving around/walking.  Adenocarcinoma of the rectum  Rectal Stricturing - Noted from bx of rectal wall - GI consulted for colonoscopy. GI to revisit this week. - Stool is bulking up.  Do not place rectal tube. -Fecal diversion is not planned at this time as the patient is not obstructed, in addition to which it would be best if his abdominal wounds are healed beforehand - Will add on CEA - Oncology consult  - Has had CT A/P during admission. Likely will need CT chest before discharge to r/o mets   FEN - Reg VTE - SCDs, Lovenox  ID - Cefepime/Flagyl Foley - in place  Shock - resolved Tobacco use ABL anemia w/ + FOBT - hgb stable at 8.1. On iron.    LOS: 9 days    Clovis Riley , MD [facility] Surgery 12/09/2020, 5:57 AM Please see Amion for pager number during day hours 7:00am-4:30pm

## 2020-12-09 NOTE — TOC Progression Note (Signed)
Transition of Care [facility]) - Progression Note    Patient Details  Name: Claud Gowan MRN: [id number] Date of Birth: [birth year]  [facility] CM/SW Contact  Leeroy Cha, RN Phone Number: 12/09/2020, 8:57 AM  Clinical Narrative:    5 Days Post-Op  Subjective: CC: Continues to feel better.  No complaints this morning.   Objective: Vital signs in last 24 hours: Temp:  [98 F (36.7 C)-99.1 F (37.3 C)] 99.1 F (37.3 C) (07/03 1900) Pulse Rate:  [60-92] 75 (07/04 0400) Resp:  [12-33] 22 (07/04 0400) BP: (84-117)/(49-71) 96/65 (07/04 0400) SpO2:  [92 %-99 %] 99 % (07/04 0400) Last BM Date: 12/08/20   Intake/Output from previous day: 07/03 0701 - 07/04 0700 In: 855.5 [I.V.:102.8; IV Piggyback:752.7] Out: 1400 [Urine:1400] Intake/Output this shift: Total I/O In: 220 [I.V.:20; IV Piggyback:200] Out: -    PE: Gen: Awake and alert, pleasant Lungs: Normal rate and effort Heart: Reg Wounds on the abdominal wall are without any surrounding cellulitis and are granulating nicely.  Perineal wound without any surrounding cellulitis, beginning to granulate although it is accumulating some fibrinous exudate at the base, and is not currently packed.  Scrotal edema improving.    TOC PLAN OF CARE: Followiing for progression of care and toc needs, mostly likely will need hhc, has no insurance will be charity.   Expected Discharge Plan: [place] Barriers to Discharge: Continued Medical Work up  Expected Discharge Plan and Services Expected Discharge Plan: [place]   Discharge Planning Services: CM Consult   Living arrangements for the past 2 months: Apartment                                       Social Determinants of Health (SDOH) Interventions    Readmission Risk Interventions No flowsheet data found.

## 2020-12-10 LAB — GLUCOSE, CAPILLARY
Glucose-Capillary: 102 mg/dL — ABNORMAL HIGH (ref 70–99)
Glucose-Capillary: 172 mg/dL — ABNORMAL HIGH (ref 70–99)
Glucose-Capillary: 105 mg/dL — ABNORMAL HIGH (ref 70–99)

## 2020-12-10 LAB — CEA: CEA: 16.7 ng/mL — ABNORMAL HIGH (ref 0.0–4.7)

## 2020-12-10 MED ORDER — BACITRACIN ZINC 500 UNIT/GM EX OINT
TOPICAL_OINTMENT | Freq: Two times a day (BID) | CUTANEOUS | Status: DC
  Administered 2020-12-11 – 2020-12-16 (×7): 1 via TOPICAL
  Filled 2020-12-10: qty 28.35
  Filled 2020-12-10 (×6): qty 0.9

## 2020-12-10 MED ORDER — HYDROCORTISONE 20 MG PO TABS
30.0000 mg | ORAL_TABLET | Freq: Two times a day (BID) | ORAL | Status: DC
  Administered 2020-12-10 – 2020-12-14 (×8): 30 mg via ORAL
  Filled 2020-12-10 (×10): qty 1

## 2020-12-10 NOTE — Progress Notes (Signed)
Nutrition Follow-up  DOCUMENTATION CODES:   Non-severe (moderate) malnutrition in context of chronic illness  INTERVENTION:  - continue Ensure Enlive BID, 1 packet Juven BID, and 30 ml Prosource Plus BID. - continue to discuss the importance of nutrition and a focus on protein.  - weigh patient today. - may need to continue to consider small bore NGT placement and initiation of short-term TF to ensure adequate nutrition for ongoing wound healing and d/t newly dx cancer.     NUTRITION DIAGNOSIS:   Moderate Malnutrition related to chronic illness as evidenced by mild fat depletion, mild muscle depletion, moderate muscle depletion. -ongoing  GOAL:   Patient will meet greater than or equal to 90% of their needs -unmet to minimally met on average  MONITOR:   PO intake, Supplement acceptance, Labs, Weight trends, Skin  ASSESSMENT:   50 year-old male with no known medical history. He presented to the hospital ~1 month ago and was noted to have a spontaneously draining perirectal abscess. He presented to the ED on 6/25 d/t worsening pain, chills, increased urinary frequency, and irregular bowel urgency. He was dx with Fournier's gangrene/necrotizing fasciitis.  Significant Events: 6/25- admission; EUA, rectal wall biopsy, I&D of perirectal, scrotal, penile, and abdominal regions d/t Fournier's gangrene. 6/26- I&D of perirectal, scrotal, penile, and abdominal regions d/t Fournier's gangrene and rectal stricturing/possible cancer. 6/28- initial RD assessment 6/29- I&D and repacking   Patient as discussed in rounds this AM.  Patient sitting in the chair with no family or visitors present. He had walked with PT shortly before RD visit and reported some soreness related to this.   Able to talk with RN before and after visiting patient. Family has brought in a few foods for patient to snack on such as crackers and what may have been broth. When RD asked patient what type of foods his  family brought for him, he did not seem to understand the question.  He reports eating well and that taking the oral nutrition supplements is going well for him. He has been eating protein at all meals and typically orders fish with lunch and/or dinner and plans to continue to do this.   Reiterated the importance of nutrition and adequate protein intake.   Per review of orders, he is accepting Prosource 100% of the time offered, Ensure nearly 100% of the time offered, and Juven 100% of the time offered.   If he were to consume 100% of the oral nutrition supplements each time he receives them, he would be consuming 1090 kcal and 75 grams protein/day from supplements.   He has not been weighed since 6/25.  He has new dx of rectal adenocarcinoma this admission.    Labs reviewed; CBGs: 102 and 172 mg/dl. Medications reviewed; 500 mg ascorbic acid BID, 325 mg ferrous sulfate, sliding scale novolog, 1 tablet multivitamin with minerals/day, 40 mg oral protonix/day, 220 mg zinc sulfate/day.    Diet Order:   Diet Order             Diet regular Room service appropriate? Yes; Fluid consistency: Thin  Diet effective now                   EDUCATION NEEDS:   Education needs have been addressed  Skin:  Skin Assessment: Skin Integrity Issues: Skin Integrity Issues:: Incisions, Other (Comment) Incisions: abdomen (6/25, 6/26, 6/29) and perineum (6/25, 6/26) Other: MASD to bilateral buttocks (newly documented on 6/30)  Last BM:  7/5 (type 6 x2)  Height:   Ht Readings from Last 1 Encounters:  11/03/20 6' (1.829 m)    Weight:   Wt Readings from Last 1 Encounters:  11/30/20 75.1 kg      Estimated Nutritional Needs:  Kcal:  2550-2750 kcal Protein:  145-160 grams Fluid:  >/= 2.8 L/day      Jarome Matin, MS, RD, LDN, CNSC Inpatient Clinical Dietitian RD pager # available in AMION  After hours/weekend pager # available in [facility]

## 2020-12-10 NOTE — Progress Notes (Addendum)
NAME:  James Coffey, MRN:  [id number], DOB:  1970-07-22, LOS: 24 ADMISSION DATE:  11/29/2020, CONSULTATION DATE:  11/30/20 REFERRING MD:  Dr Georgette Dover, CHIEF COMPLAINT: Septic shock secondary to Fournier's gangrene  History of Present Illness:  50yo male was admitted with Fournier's gangrene Post I&D and debridement x3  Post I&D remained hypotensive, for which PCCM was consulted for management of septic shock  Pertinent  Medical History  NA  Significant Hospital Events:  6/25 I&D and debridement. Central line placement  6/26 Second I&D 6/28 Hypokalemic overnight, Levophed slightly increased to 20mcg 6/29 third I&D 6/30 remains on levo, great urine output. Received one unit PRBC overnight for hgb 7.1 7/1 PICC line placed and CVC removed overnight, pressor requirement decreased with addition of midodrine D/c'd levophed    Antibiotics: Cefepime 6/24, 6/29 Zosyn 6/24>> 6/29 Vancomycin 6/24>> 6/26 Flagyl  6/29 >>> Maxepime 6/29 >>>   Scheduled Meds:  (feeding supplement) PROSource Plus  30 mL Oral BID BM   vitamin C  500 mg Oral BID   Chlorhexidine Gluconate Cloth  6 each Topical Daily   cholestyramine light  4 g Oral QID   enoxaparin (LOVENOX) injection  40 mg Subcutaneous Daily   feeding supplement  237 mL Oral BID BM   ferrous sulfate  325 mg Oral Q breakfast   hydrocortisone  40 mg Oral BID   insulin aspart  1-3 Units Subcutaneous TID WC   lip balm  1 application Topical BID   liver oil-zinc oxide   Topical BID   mouth rinse  15 mL Mouth Rinse BID   midodrine  10 mg Oral TID WC   multivitamin with minerals  1 tablet Oral Daily   nicotine  21 mg Transdermal Daily   nutrition supplement (JUVEN)  1 packet Oral BID BM   oxyCODONE  5 mg Oral Q4H   pantoprazole  40 mg Oral QHS   sodium chloride flush  10-40 mL Intracatheter Q12H   zinc sulfate  220 mg Oral Daily   Continuous Infusions:  sodium chloride 10 mL/hr at 12/10/20 0600   ceFEPime (MAXIPIME) IV Stopped  (12/10/20 0554)   metronidazole Stopped (12/09/20 2335)   PRN Meds:.acetaminophen, fentaNYL (SUBLIMAZE) injection, magic mouthwash, metoprolol tartrate, ondansetron **OR** ondansetron (ZOFRAN) IV, simethicone, sodium chloride flush    Interim History / Subjective:  Had a good night. States pain is very manageable Good spirits 7/5 am. On RA, MAP of 75 off pressors WBC 10.8, remains on Flagyl and Maxipime, T Max 98.5 Net negative 6590  Objective   Blood pressure 101/65, pulse 75, temperature 98 F (36.7 C), temperature source Oral, resp. rate 13, weight 75.1 kg, SpO2 97 %.        Intake/Output Summary (Last 24 hours) at 12/10/2020 0939 Last data filed at 12/10/2020 0600 Gross per 24 hour  Intake 1522.19 ml  Output 2625 ml  Net -1102.81 ml   Filed Weights   11/30/20 0800  Weight: 75.1 kg   Physical Exam Tmax   98.5  General appearance:    more chronically than acutely ill appearing now  At Rest 02 sats  96% on RA  NCAT, No jvd, No LAD Oropharynx clear,  mucosa nl Neck supple Bilateral chest excursion, Lungs with a min scattered exp > insp rhonchi bilaterally, slightly diminished per bases RRR no s3 or or sign murmur, NSR-ST per tele Abd less tender, limited excursion , ND, BS +, Body mass index is 22.45 kg/m. Extr warm with no  edema or clubbing noted, brisk refill Neuro  Awake , alert and oriented x 3, appropriate    Labs/imaging   6/25 CT ABD pelvis > Worsening perineal inflammation, including right peri-rectal. Significant progression of soft tissue gas, now extending anteriorly into the perineum, base of the penis, both scrotal sacs and anterior abdominal wall. Findings are highly suspicious for necrotizing soft tissue infection, and urgent surgical consult is recommended.  Irregular presacral nodule measuring 2.2 cm may represent an enlarged lymph node but is nonspecific. Recommend follow-up after course of treatment. There is rectal wall thickening which may be  inflammatory, however neoplasm is not excluded  Resolved Hospital Problem list   AKI  Assessment & Plan:  Septic shock secondary to Fournier's Gangrene of the perirectal, scrotal, penile and abdominal regions -  resolved, off pressors x 24 h  -S/p incision and drainage surgical debridement on 6/25, 6/26, 6/29 -Surgery and urology following  P: - Continue antibiotics as ordered Continue scheduled oxy  Daily assessment of ability to decreased opoid use  Wound care midodrine 10 mg TID -encourage oral hydration -PO oxycodone for pain control + PRN fentanyl  -cortisol  17.3 on 7/1 so may have poor adrenal reserve >>> try po hydrocortisone 7/3  and taper off  >>> d/c'd levophed am 7/3  Trend WBC and fever curve  Adenocarcinoma of the rectum -Noted during debridement of Fouriner's gangrene  P: GI consulting  Colonoscopy pending stability Per patient request he has requested clinical staff not to tell family cancer diagnosis  -avoid rectal tube per CCS recommendations -likely will need CT chest at some point to r/o metastatic involvement     Tobacco use disorder P: Nicotine patch  Will need CT chest to evaluate for any metastatic disease as OP  Normocytic anemia with mixed blood loss anemia  Lab Results  Component Value Date   HGB 8.8 (L) 12/08/2020   HGB 8.7 (L) 12/07/2020   HGB 8.1 (L) 12/06/2020    P: Iron supplementation    Will [facility] to [facility] 7[facility] am , and will transfer out of ICU to progressive bed.    Best Practice   Diet/type: dysphagia diet (see orders) DVT prophylaxis: Lovenox GI prophylaxis: N/A Lines: Central line Foley:  Yes, and it is still needed Code Status:  full code Last date of multidisciplinary goals of care discussion Per primary     Magdalen Spatz, AGACNP-BC Pulmonary and [place] for pager numbers After 7:00 pm call Elink  [phone] >> Hospital use only, no office use 12/10/2020 9:59  AM

## 2020-12-10 NOTE — Progress Notes (Signed)
Patient was safely and successfully moved to ROOM 1435. Gerald Stabs, RN received patient and report.

## 2020-12-10 NOTE — Consult Note (Signed)
[place] Nurse ostomy follow up Patient receiving care in [place]. Per note from surgeon 12/09/20, fecal diversion not planned at this time.  Please re-consult the [place] team if needed.  Val Riles, RN, MSN, CWOCN, CNS-BC, pager [phone]

## 2020-12-10 NOTE — Progress Notes (Signed)
Progress Note  6 Days Post-Op  Subjective: Patient reports some pain in sides. Tolerating dressing changes well. Having bowel function.   Objective: Vital signs in last 24 hours: Temp:  [97.7 F (36.5 C)-98.5 F (36.9 C)] 97.7 F (36.5 C) (07/05 1130) Pulse Rate:  [71-100] 78 (07/05 1500) Resp:  [12-28] 20 (07/05 1500) BP: (86-118)/(48-75) 93/53 (07/05 1200) SpO2:  [85 %-100 %] 97 % (07/05 1500) Last BM Date: 12/10/20  Intake/Output from previous day: 07/04 0701 - 07/05 0700 In: 1882.2 [P.O.:1080; I.V.:186.1; IV Piggyback:616.1] Out: 2825 [Urine:2825] Intake/Output this shift: Total I/O In: 181.4 [I.V.:81.4; IV Piggyback:100] Out: -   PE: Gen: Awake and alert, pleasant Lungs: Normal rate and effort Heart: Reg Wounds: Seen with RN  Perineal wound noted above with scant purulent drainage, healthy granulation tissue, some skin irritation surrounding   Abdominal wounds without surrounding cellulitis - BL flank wounds clean without purulence   Suprapubic wound with some fibrinous exudate but healthy granulation tissue as well   Infraumbilical wound with small amount fibrinous exudate in base    Lab Results:  Recent Labs    12/08/20 0500  WBC 10.8*  HGB 8.8*  HCT 28.4*  PLT 494*   BMET Recent Labs    12/08/20 0500  NA 139  K 3.8  CL 106  CO2 26  GLUCOSE 163*  BUN 11  CREATININE 0.54*  CALCIUM 8.9   PT/INR No results for input(s): LABPROT, INR in the last 72 hours. CMP     Component Value Date/Time   NA 139 12/08/2020 0500   K 3.8 12/08/2020 0500   CL 106 12/08/2020 0500   CO2 26 12/08/2020 0500   GLUCOSE 163 (H) 12/08/2020 0500   BUN 11 12/08/2020 0500   CREATININE 0.54 (L) 12/08/2020 0500   CALCIUM 8.9 12/08/2020 0500   PROT 6.5 12/08/2020 0500   ALBUMIN 2.5 (L) 12/08/2020 0500   AST 12 (L) 12/08/2020 0500   ALT 23 12/08/2020 0500   ALKPHOS 58 12/08/2020 0500   BILITOT 0.4 12/08/2020 0500   GFRNONAA >60 12/08/2020 0500   Lipase      Component Value Date/Time   LIPASE 22 11/03/2020 1714       Studies/Results: No results found.  Anti-infectives: Anti-infectives (From admission, onward)    Start     Dose/Rate Route Frequency Ordered Stop   12/04/20 2200  ceFEPIme (MAXIPIME) 2 g in sodium chloride 0.9 % 100 mL IVPB        2 g 200 mL/hr over 30 Minutes Intravenous Every 8 hours 12/04/20 1327     12/04/20 2200  metroNIDAZOLE (FLAGYL) IVPB 500 mg        500 mg 100 mL/hr over 60 Minutes Intravenous Every 12 hours 12/04/20 1327     12/02/20 1400  linezolid (ZYVOX) IVPB 600 mg  Status:  Discontinued        600 mg 300 mL/hr over 60 Minutes Intravenous Every 12 hours 12/02/20 1138 12/04/20 1327   11/30/20 1500  vancomycin (VANCOREADY) IVPB 1250 mg/250 mL  Status:  Discontinued        1,250 mg 166.7 mL/hr over 90 Minutes Intravenous Every 12 hours 11/30/20 0606 12/02/20 1138   11/30/20 0600  piperacillin-tazobactam (ZOSYN) IVPB 3.375 g  Status:  Discontinued        3.375 g 12.5 mL/hr over 240 Minutes Intravenous Every 8 hours 11/30/20 0237 12/04/20 1327   11/30/20 0403  piperacillin-tazobactam (ZOSYN) 3.375 GM/50ML IVPB       Note  to Pharmacy: Hedy Camara   : cabinet override      11/30/20 0403 12/01/20 0206   11/30/20 0230  clindamycin (CLEOCIN) IVPB 600 mg  Status:  Discontinued        600 mg 100 mL/hr over 30 Minutes Intravenous  Once 11/30/20 0219 11/30/20 0237   11/30/20 0000  ceFEPIme (MAXIPIME) 2 g in sodium chloride 0.9 % 100 mL IVPB        2 g 200 mL/hr over 30 Minutes Intravenous  Once 11/29/20 2347 11/30/20 0033   11/30/20 0000  vancomycin (VANCOREADY) IVPB 1500 mg/300 mL        1,500 mg 150 mL/hr over 120 Minutes Intravenous  Once 11/29/20 2347 11/30/20 0300   11/29/20 2345  vancomycin (VANCOCIN) IVPB 1000 mg/200 mL premix  Status:  Discontinued        1,000 mg 200 mL/hr over 60 Minutes Intravenous  Once 11/29/20 2340 11/29/20 2347   11/29/20 2345  ceFEPIme (MAXIPIME) 1 g in sodium chloride 0.9 %  100 mL IVPB  Status:  Discontinued        1 g 200 mL/hr over 30 Minutes Intravenous  Once 11/29/20 2340 11/29/20 2347        Assessment/Plan POD10 s/p EUA, rectal wall bx, I&D of perirectal, scrotal, penile and abdominal regions for Fournier's gangrene - Dr. Johney Maine w/ Dr. Alinda Money - 11/30/20 POD9s/p I&D of perirectal, scrotal, penile and abdominal regions for Fournier's gangrene, rectal stricturing/possible cancer - Dr. Johney Maine and Dr. Alinda Money - 12/01/20 POD6 s/p EUA w/ I&D for Fournier's gangrene - Dr. Hassell Done and Dr. Alinda Money - 12/04/20 - Wounds clean. No further debridement indicated at this time - BID WTD-please ensure that the perineal wound is being packed with a damp to dry dressing at least twice daily and after any bowel movements in order to endorse micro debridement of the accumulating fibrinous exudate - [place] for periwound fungal infection around right perineum incision . Avoid tape (use mesh undergarment). Keep periwound clean and dry - Cont abx.Cx w/ E. Coli, Enterobacter cloacae, Bacteroides Ovatus, Beta Lactamase + - Foley per urology - Appreciate CCM/TRH assistance - wounds cleaning up well - will evaluate wounds again Friday   Adenocarcinoma of the rectum Rectal Stricturing - Noted from bx of rectal wall - GI consulted for colonoscopy - recommending waiting until perineal wounds have healed  - Stool is bulking up.  Do not place rectal tube. - Fecal diversion is not planned at this time as the patient is not obstructed, in addition to which it would be best if his abdominal wounds are healed beforehand - CEA 16.7 - Oncology consult - Has had CT A/P during admission. Likely will need CT chest before discharge to r/o mets   FEN - Reg VTE - SCDs, Lovenox ID - Cefepime/Flagyl Foley - in place   Shock - resolved Tobacco use ABL anemia w/ + FOBT - hgb stable at 8.8 on 7/3. On iron.   LOS: 10 days    Norm Parcel, [facility] Surgery 12/10/2020, 3:39 PM Please  see Amion for pager number during day hours 7:00am-4:30pm

## 2020-12-10 NOTE — Evaluation (Signed)
Physical Therapy Evaluation Patient Details Name: James Coffey MRN: [id number] DOB: 1971-05-18 Today's Date: 12/10/2020   History of Present Illness  50 yo male admitted with fournier's gangrene. S/P I&D perirectal, scrotal, and abdomen. Post op hypotension. Also found to have rectal ca.  Clinical Impression  On eval, pt required Min assist for mobility. He walked ~135 feet with a rollator. Moderate pain with activity. Very tender when cleaning up area before applying ABD and mesh brief for walking. Pt is generally weak and unsteady. Will plan to follow and progress activity as tolerated.     Follow Up Recommendations No PT follow up;Supervision/Assistance - 24 hour    Equipment Recommendations   (possibly RW-depends on progress)    Recommendations for Other Services       Precautions / Restrictions Precautions Precautions: Fall Precaution Comments: wouonds-packing but no outside dressing-donned ABD and mesh briefs Restrictions Weight Bearing Restrictions: No      Mobility  Bed Mobility Overal bed mobility: Needs Assistance Bed Mobility: Sidelying to Sit   Sidelying to sit: Min assist       General bed mobility comments: Assist for trunk. Increased time. Task is efforful and painful    Transfers Overall transfer level: Needs assistance Equipment used: 4-wheeled walker Transfers: Sit to/from Stand Sit to Stand: Min assist         General transfer comment: Assist to power up, steady.  Ambulation/Gait Ambulation/Gait assistance: Min assist Gait Distance (Feet): 135 Feet Assistive device: 4-wheeled walker Gait Pattern/deviations: Step-through pattern;Decreased stride length     General Gait Details: Assist to stabilize throughout distance. O2 briefly dropped to 87% but returned to >90%. Pt tolerated distance well. Some lightheadedness.  Stairs            Wheelchair Mobility    Modified Rankin (Stroke Patients Only)       Balance Overall balance  assessment: Needs assistance           Standing balance-Leahy Scale: Fair                               Pertinent Vitals/Pain Pain Assessment: Faces Faces Pain Scale: Hurts even more Pain Location: wound areas Pain Descriptors / Indicators: Grimacing;Discomfort;Tender Pain Intervention(s): Monitored during session;Repositioned    Home Living Family/patient expects to be discharged to:: Private residence Living Arrangements: Spouse/significant other;Children             Home Equipment: None      Prior Function Level of Independence: Independent               Hand Dominance        Extremity/Trunk Assessment   Upper Extremity Assessment Upper Extremity Assessment: Overall WFL for tasks assessed    Lower Extremity Assessment Lower Extremity Assessment: Generalized weakness    Cervical / Trunk Assessment Cervical / Trunk Assessment: Normal  Communication   Communication: Prefers language other than English (but speaks some [place])  Cognition Arousal/Alertness: Awake/alert Behavior During Therapy: WFL for tasks assessed/performed Overall Cognitive Status: Within Functional Limits for tasks assessed                                        General Comments      Exercises     Assessment/Plan    PT Assessment Patient needs continued PT services  PT Problem List Decreased strength;Decreased mobility;Decreased  balance;Decreased knowledge of use of DME;Pain;Decreased activity tolerance;Decreased skin integrity       PT Treatment Interventions DME instruction;Gait training;Therapeutic exercise;Balance training;Functional mobility training;Therapeutic activities;Patient/family education    PT Goals (Current goals can be found in the Care Plan section)  Acute Rehab PT Goals Patient Stated Goal: less pain PT Goal Formulation: With patient Time For Goal Achievement: 12/24/20 Potential to Achieve Goals: Good    Frequency  Min 3X/week   Barriers to discharge        Co-evaluation               AM-PAC PT "6 Clicks" Mobility  Outcome Measure Help needed turning from your back to your side while in a flat bed without using bedrails?: A Little Help needed moving from lying on your back to sitting on the side of a flat bed without using bedrails?: A Little Help needed moving to and from a bed to a chair (including a wheelchair)?: A Little Help needed standing up from a chair using your arms (e.g., wheelchair or bedside chair)?: A Little Help needed to walk in hospital room?: A Little Help needed climbing 3-5 steps with a railing? : A Lot 6 Click Score: 17    End of Session   Activity Tolerance: Patient tolerated treatment well Patient left: in chair;with call bell/phone within reach   PT Visit Diagnosis: Muscle weakness (generalized) (M62.81);Pain;Unsteadiness on feet (R26.81)    Time: 4799-8721 PT Time Calculation (min) (ACUTE ONLY): 21 min   Charges:   PT Evaluation $PT Eval Moderate Complexity: 1 Mod             Doreatha Massed, PT Acute Rehabilitation  Office: [phone] Pager: [phone]

## 2020-12-10 NOTE — Progress Notes (Signed)
[facility] Gastroenterology Progress Note  James Coffey 50 y.o. [birth year]  CC: Adenocarcinoma of the rectum, necrotizing fasciitis of perineum   Subjective: Patient seen and examined at bedside.  He continues to have diarrhea.  Complaining of mild abdominal soreness.  Denies nausea and vomiting.  ROS : Afebrile.  Negative for chest pain   Objective: Vital signs in last 24 hours: Vitals:   12/10/20 0600 12/10/20 0900  BP: 101/65   Pulse: 75   Resp: 13   Temp:  98 F (36.7 C)  SpO2: 97%     Physical Exam:  General:  Alert, cooperative, no distress, appears stated age  Head:  Normocephalic, without obvious abnormality, atraumatic  Eyes:  , EOM's intact,   Lungs:   No visible respiratory distress  Heart:  Regular rate and rhythm, S1, S2 normal  Abdomen:   Dressing around the abdomen noted.  Mild discomfort on palpation.  Abdomen is soft.  Bowel sounds present.  No peritoneal signs  Neuro Alert and oriented x3  Psych Mood and affect normal    Lab Results: Recent Labs    12/08/20 0500  NA 139  K 3.8  CL 106  CO2 26  GLUCOSE 163*  BUN 11  CREATININE 0.54*  CALCIUM 8.9   Recent Labs    12/08/20 0500  AST 12*  ALT 23  ALKPHOS 58  BILITOT 0.4  PROT 6.5  ALBUMIN 2.5*   Recent Labs    12/08/20 0500  WBC 10.8*  HGB 8.8*  HCT 28.4*  MCV 91.6  PLT 494*   No results for input(s): LABPROT, INR in the last 72 hours.    Assessment/Plan: -Adenocarcinoma of the rectum noted during debridement -Necrotizing fasciitis of perineum, scrotum, and abdominal wall.  Status post I&D.  Recommendations -------------------------- -Case discussed with nursing staff at bedside.  Surgery and urology notes reviewed. -Doing colonoscopy in the next few days will not change his management.  I would recommend doing colonoscopy once his perineal wounds are healed or  may be few days/ week before he is able to undergo surgical resection for his cancer. -GI will follow periodically.   Call us back earlier if needed.  Discussed with Dr. Watt Climes.   Otis Brace MD, James Coffey 12/10/2020, 9:13 AM  Contact #  [phone]

## 2020-12-11 ENCOUNTER — Other Ambulatory Visit: Payer: Self-pay

## 2020-12-11 LAB — CBC WITH DIFFERENTIAL/PLATELET
Abs Immature Granulocytes: 0.25 10*3/uL — ABNORMAL HIGH (ref 0.00–0.07)
Basophils Absolute: 0 10*3/uL (ref 0.0–0.1)
Basophils Relative: 0 %
Eosinophils Absolute: 0 10*3/uL (ref 0.0–0.5)
Eosinophils Relative: 0 %
HCT: 31.4 % — ABNORMAL LOW (ref 39.0–52.0)
Hemoglobin: 9.5 g/dL — ABNORMAL LOW (ref 13.0–17.0)
Immature Granulocytes: 2 %
Lymphocytes Relative: 9 %
Lymphs Abs: 1.2 10*3/uL (ref 0.7–4.0)
MCH: 28.8 pg (ref 26.0–34.0)
MCHC: 30.3 g/dL (ref 30.0–36.0)
MCV: 95.2 fL (ref 80.0–100.0)
Monocytes Absolute: 0.6 10*3/uL (ref 0.1–1.0)
Monocytes Relative: 5 %
Neutro Abs: 11 10*3/uL — ABNORMAL HIGH (ref 1.7–7.7)
Neutrophils Relative %: 84 %
Platelets: 513 10*3/uL — ABNORMAL HIGH (ref 150–400)
RBC: 3.3 MIL/uL — ABNORMAL LOW (ref 4.22–5.81)
RDW: 19.4 % — ABNORMAL HIGH (ref 11.5–15.5)
WBC: 13.2 10*3/uL — ABNORMAL HIGH (ref 4.0–10.5)
nRBC: 0 % (ref 0.0–0.2)

## 2020-12-11 LAB — GLUCOSE, CAPILLARY
Glucose-Capillary: 138 mg/dL — ABNORMAL HIGH (ref 70–99)
Glucose-Capillary: 148 mg/dL — ABNORMAL HIGH (ref 70–99)
Glucose-Capillary: 158 mg/dL — ABNORMAL HIGH (ref 70–99)
Glucose-Capillary: 169 mg/dL — ABNORMAL HIGH (ref 70–99)
Glucose-Capillary: 93 mg/dL (ref 70–99)

## 2020-12-11 LAB — BASIC METABOLIC PANEL
Anion gap: 8 (ref 5–15)
BUN: 15 mg/dL (ref 6–20)
CO2: 26 mmol/L (ref 22–32)
Calcium: 8.9 mg/dL (ref 8.9–10.3)
Chloride: 106 mmol/L (ref 98–111)
Creatinine, Ser: 0.5 mg/dL — ABNORMAL LOW (ref 0.61–1.24)
GFR, Estimated: >60 mL/min (ref >60)
Glucose, Bld: 121 mg/dL — ABNORMAL HIGH (ref 70–99)
Potassium: 3.9 mmol/L (ref 3.5–5.1)
Sodium: 140 mmol/L (ref 135–145)

## 2020-12-11 LAB — MAGNESIUM: Magnesium: 2.6 mg/dL — ABNORMAL HIGH (ref 1.7–2.4)

## 2020-12-11 NOTE — Progress Notes (Signed)
Patient had a bowel movement. Stool was cleaned from patient, perineal incision area was cleaned well with nss to try and remove all of the stool that had gotten in the incision. Wound was repacked with saline moistened kerlix gauze, covered with dry gauze 4x4's, covered with an ABD pad, and medipore tape was used to try and seal 3 sides. Patient tolerated well and was medicated with scheduled oxycodone. Vss, will continue to monitor.

## 2020-12-11 NOTE — Plan of Care (Signed)
  Problem: Education: Goal: Knowledge of General Education information will improve Description: Including pain rating scale, medication(s)/side effects and non-pharmacologic comfort measures Outcome: Progressing   Problem: Clinical Measurements: Goal: Diagnostic test results will improve Outcome: Progressing   Problem: Activity: Goal: Risk for activity intolerance will decrease Outcome: Progressing   Problem: Nutrition: Goal: Adequate nutrition will be maintained Outcome: Progressing   Problem: Pain Managment: Goal: General experience of comfort will improve Outcome: Progressing   Problem: Safety: Goal: Ability to remain free from injury will improve Outcome: Progressing   Problem: Skin Integrity: Goal: Risk for impaired skin integrity will decrease Outcome: Progressing

## 2020-12-11 NOTE — Plan of Care (Signed)
  Problem: Pain Managment: Goal: General experience of comfort will improve Outcome: Progressing   Problem: Safety: Goal: Ability to remain free from injury will improve Outcome: Progressing   

## 2020-12-11 NOTE — Progress Notes (Signed)
The proposed treatment discussed in conference is for discussion purpose only and is not a binding recommendation.  The patients have not been physically examined, or presented with their treatment options.  Therefore, final treatment plans cannot be decided.  

## 2020-12-11 NOTE — Plan of Care (Signed)
  Problem: Education: Goal: Knowledge of General Education information will improve Description: Including pain rating scale, medication(s)/side effects and non-pharmacologic comfort measures Outcome: Progressing   Problem: Health Behavior/Discharge Planning: Goal: Ability to manage health-related needs will improve Outcome: Progressing   Problem: Clinical Measurements: Goal: Ability to maintain clinical measurements within normal limits will improve Outcome: Progressing Goal: Will remain free from infection Outcome: Progressing Goal: Diagnostic test results will improve Outcome: Progressing Goal: Respiratory complications will improve Outcome: Progressing Goal: Cardiovascular complication will be avoided Outcome: Progressing   Problem: Activity: Goal: Risk for activity intolerance will decrease Outcome: Progressing   Problem: Nutrition: Goal: Adequate nutrition will be maintained Outcome: Progressing   Problem: Coping: Goal: Level of anxiety will decrease Outcome: Progressing   Problem: Elimination: Goal: Will not experience complications related to bowel motility Outcome: Progressing Goal: Will not experience complications related to urinary retention Outcome: Progressing   Problem: Pain Managment: Goal: General experience of comfort will improve Outcome: Progressing   Problem: Safety: Goal: Ability to remain free from injury will improve Outcome: Progressing   Problem: Skin Integrity: Goal: Risk for impaired skin integrity will decrease Outcome: Progressing   Problem: Clinical Measurements: Goal: Ability to maintain clinical measurements within normal limits will improve Outcome: Progressing Goal: Postoperative complications will be avoided or minimized Outcome: Progressing   Problem: Skin Integrity: Goal: Demonstration of wound healing without infection will improve Outcome: Progressing   

## 2020-12-11 NOTE — Progress Notes (Signed)
PROGRESS NOTE    James Coffey  TOI:[id number] DOB: Oct 22, 1970 DOA: 11/29/2020 PCP: Pcp, No  Brief Narrative:  This 50 years old male with PMH significant for tobacco abuse, history of rectal cancer presented in the ED with septic shock secondary to Fournier's gangrene of the perirectal, scrotal, penile and abdominal regions.  Patient was initially admitted in the ICU,  he was requiring IV pressors to maintain blood pressure.  Blood pressure has improved.  He is off pressors now,  he underwent multiple incision and drainage and surgical debridements,  general surgery and urology is following.  Patient is on IV antibiotics.  He is started on midodrine for blood pressure control.  Significant hospital events. 6/25 I&D and debridement. Central line placement 6/26 Second I&D 6/28 Hypokalemic overnight, Levophed slightly increased to 69mcg 6/29 third I&D 6/30 remains on levo, great urine output. Received one unit PRBC overnight for hgb 7.1 7/1 PICC line placed and CVC removed overnight, pressor requirement decreased with addition of midodrine D/c'd levophed    Assessment & Plan:   Principal Problem:   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 Active Problems:   Perirectal abscess   Proctitis   Sepsis ([place])   Tobacco abuse   [place] speaking patient - Prefers [place] or [place]   Fourniers gangrene   Stricture of rectum   Malnutrition of moderate degree   Rectal cancer ([place])  Septic shock secondary to Fournier's gangrene: Patient was hypotensive, tachycardic,  tachypneic with focus of infection on arrival. He was initially admitted in ICU , requiring Levophed support. General surgery was consulted,  He underwent multiple surgical debridements on 6/25, 6/26, 6/29. Continue IV antibiotics (cefepime and Flagyl). Continue adequate pain control with oxycodone. Daily wound assessment, continue wound care Continue midodrine 10 mg 3 times daily Patient is off Levophed.  Sepsis physiology is  improving. Shock is resolved. Encourage oral hydration.  Adenocarcinoma of rectum. It was noted during debridement of Fournier's gangrene. GI following, recommended colonoscopy once patient is medically stable. Patient requested clinical staff not to discuss cancer diagnosis with his family members. Avoid rectal tube as per PCCM recommendation.  Patient will eventually need CT chest at some point to rule out metastatic disease. Fecal diversion is not planned at this time as the patient is not obstructed. Needs oncology consult. Continue Foley catheter per urology.  Tobacco use disorder: Nicotine patch.  Normocytic normochromic anemia. Hb remained stable,  continue iron supplementation.  DVT prophylaxis: Lovenox Code Status: Full code. Family Communication: No family at bed side. Disposition Plan:   Status is: Inpatient  Remains inpatient appropriate because:Inpatient level of care appropriate due to severity of illness  Dispo: The patient is from: Home              Anticipated d/c is to: Home              Patient currently is not medically stable to d/c.   Difficult to place patient No  Consultants:  General surgery, urology, PCCM  Procedures: Multiple incision and drainage and debridements  Antimicrobials:   Anti-infectives (From admission, onward)    Start     Dose/Rate Route Frequency Ordered Stop   12/04/20 2200  ceFEPIme (MAXIPIME) 2 g in sodium chloride 0.9 % 100 mL IVPB        2 g 200 mL/hr over 30 Minutes Intravenous Every 8 hours 12/04/20 1327     12/04/20 2200  metroNIDAZOLE (FLAGYL) IVPB 500 mg        500 mg 100 mL/hr  over 60 Minutes Intravenous Every 12 hours 12/04/20 1327     12/02/20 1400  linezolid (ZYVOX) IVPB 600 mg  Status:  Discontinued        600 mg 300 mL/hr over 60 Minutes Intravenous Every 12 hours 12/02/20 1138 12/04/20 1327   11/30/20 1500  vancomycin (VANCOREADY) IVPB 1250 mg/250 mL  Status:  Discontinued        1,250 mg 166.7 mL/hr over  90 Minutes Intravenous Every 12 hours 11/30/20 0606 12/02/20 1138   11/30/20 0600  piperacillin-tazobactam (ZOSYN) IVPB 3.375 g  Status:  Discontinued        3.375 g 12.5 mL/hr over 240 Minutes Intravenous Every 8 hours 11/30/20 0237 12/04/20 1327   11/30/20 0403  piperacillin-tazobactam (ZOSYN) 3.375 GM/50ML IVPB       Note to Pharmacy: Hedy Camara   : cabinet override      11/30/20 0403 12/01/20 0206   11/30/20 0230  clindamycin (CLEOCIN) IVPB 600 mg  Status:  Discontinued        600 mg 100 mL/hr over 30 Minutes Intravenous  Once 11/30/20 0219 11/30/20 0237   11/30/20 0000  ceFEPIme (MAXIPIME) 2 g in sodium chloride 0.9 % 100 mL IVPB        2 g 200 mL/hr over 30 Minutes Intravenous  Once 11/29/20 2347 11/30/20 0033   11/30/20 0000  vancomycin (VANCOREADY) IVPB 1500 mg/300 mL        1,500 mg 150 mL/hr over 120 Minutes Intravenous  Once 11/29/20 2347 11/30/20 0300   11/29/20 2345  vancomycin (VANCOCIN) IVPB 1000 mg/200 mL premix  Status:  Discontinued        1,000 mg 200 mL/hr over 60 Minutes Intravenous  Once 11/29/20 2340 11/29/20 2347   11/29/20 2345  ceFEPIme (MAXIPIME) 1 g in sodium chloride 0.9 % 100 mL IVPB  Status:  Discontinued        1 g 200 mL/hr over 30 Minutes Intravenous  Once 11/29/20 2340 11/29/20 2347        Subjective: Patient was seen and examined at bedside.  Overnight events noted.  Patient reports feeling much improved.   Blood pressure has improved.  Wounds are healing better.  Objective: Vitals:   12/10/20 2018 12/11/20 0015 12/11/20 0503 12/11/20 1429  BP: 98/65 95/65 97/66  (!) 93/58  Pulse: 87 75 85 86  Resp: 20  16 20   Temp: 98.7 F (37.1 C) 98.3 F (36.8 C) 98.5 F (36.9 C) 98.3 F (36.8 C)  TempSrc: Oral  Oral Oral  SpO2: 95% 96% 95% 96%  Weight:      Height:        Intake/Output Summary (Last 24 hours) at 12/11/2020 1431 Last data filed at 12/11/2020 1115 Gross per 24 hour  Intake 1321.77 ml  Output 2450 ml  Net -1128.23 ml   Filed  Weights   11/30/20 0800 12/10/20 1703  Weight: 75.1 kg 64 kg    Examination:  General exam: Appears calm and comfortable , not in any acute distress. Respiratory system: Clear to auscultation. Respiratory effort normal. Cardiovascular system: S1 & S2 heard, RRR. No JVD, murmurs, rubs, gallops or clicks. No pedal edema. Gastrointestinal system: Abdomen is nondistended, soft and mildly tender, no organomegaly or masses felt. Normal bowel sounds heard. Multiple wounds noted but which are covered in dressing. Central nervous system: Alert and oriented. No focal neurological deficits. Extremities: Symmetric 5 x 5 power. Skin: No rashes, lesions or ulcers Psychiatry: Judgement and insight appear normal. Mood & affect  appropriate.     Data Reviewed: I have personally reviewed following labs and imaging studies  CBC: Recent Labs  Lab 12/05/20 0335 12/06/20 0548 12/07/20 0500 12/08/20 0500 12/11/20 0314  WBC 12.6* 12.8* 13.1* 10.8* 13.2*  NEUTROABS  --   --   --   --  11.0*  HGB 7.6* 8.1* 8.7* 8.8* 9.5*  HCT 24.2* 25.8* 28.1* 28.4* 31.4*  MCV 90.3 91.5 92.7 91.6 95.2  PLT 394 436* 489* 494* 604*   Basic Metabolic Panel: Recent Labs  Lab 12/05/20 0335 12/08/20 0500 12/11/20 0314  NA 138 139 140  K 4.0 3.8 3.9  CL 105 106 106  CO2 27 26 26   GLUCOSE 171* 163* 121*  BUN 9 11 15   CREATININE 0.63 0.54* 0.50*  CALCIUM 8.2* 8.9 8.9  MG  --   --  2.6*   GFR: Estimated Creatinine Clearance: 98.9 mL/min (A) (by C-G formula based on SCr of 0.5 mg/dL (L)). Liver Function Tests: Recent Labs  Lab 12/08/20 0500  AST 12*  ALT 23  ALKPHOS 58  BILITOT 0.4  PROT 6.5  ALBUMIN 2.5*   No results for input(s): LIPASE, AMYLASE in the last 168 hours. No results for input(s): AMMONIA in the last 168 hours. Coagulation Profile: No results for input(s): INR, PROTIME in the last 168 hours. Cardiac Enzymes: No results for input(s): CKTOTAL, CKMB, CKMBINDEX, TROPONINI in the last 168  hours. BNP (last 3 results) No results for input(s): PROBNP in the last 8760 hours. HbA1C: No results for input(s): HGBA1C in the last 72 hours. CBG: Recent Labs  Lab 12/10/20 1144 12/10/20 1651 12/11/20 0048 12/11/20 0738 12/11/20 1152  GLUCAP 172* 105* 138* 93 158*   Lipid Profile: No results for input(s): CHOL, HDL, LDLCALC, TRIG, CHOLHDL, LDLDIRECT in the last 72 hours. Thyroid Function Tests: No results for input(s): TSH, T4TOTAL, FREET4, T3FREE, THYROIDAB in the last 72 hours. Anemia Panel: No results for input(s): VITAMINB12, FOLATE, FERRITIN, TIBC, IRON, RETICCTPCT in the last 72 hours. Sepsis Labs: No results for input(s): PROCALCITON, LATICACIDVEN in the last 168 hours.  Recent Results (from the past 240 hour(s))  MRSA Next Gen by PCR, Nasal     Status: None   Collection Time: 12/08/20  8:38 AM   Specimen: Nasal Mucosa; Nasal Swab  Result Value Ref Range Status   MRSA by PCR Next Gen NOT DETECTED NOT DETECTED Final    Comment: (NOTE) The GeneXpert MRSA Assay (FDA approved for NASAL specimens only), is one component of a comprehensive MRSA colonization surveillance program. It is not intended to diagnose MRSA infection nor to guide or monitor treatment for MRSA infections. Test performance is not FDA approved in patients less than 61 years old. Performed at [facility], [place] [address], [place], [place] [address]     Radiology Studies: No results found.  Scheduled Meds:  (feeding supplement) PROSource Plus  30 mL Oral BID BM   vitamin C  500 mg Oral BID   bacitracin   Topical BID   Chlorhexidine Gluconate Cloth  6 each Topical Daily   cholestyramine light  4 g Oral QID   enoxaparin (LOVENOX) injection  40 mg Subcutaneous Daily   feeding supplement  237 mL Oral BID BM   ferrous sulfate  325 mg Oral Q breakfast   hydrocortisone  30 mg Oral BID   insulin aspart  1-3 Units Subcutaneous TID WC   lip balm  1 application Topical BID   liver  oil-zinc oxide  Topical BID   mouth rinse  15 mL Mouth Rinse BID   midodrine  10 mg Oral TID WC   multivitamin with minerals  1 tablet Oral Daily   nicotine  21 mg Transdermal Daily   nutrition supplement (JUVEN)  1 packet Oral BID BM   oxyCODONE  5 mg Oral Q4H   pantoprazole  40 mg Oral QHS   sodium chloride flush  10-40 mL Intracatheter Q12H   zinc sulfate  220 mg Oral Daily   Continuous Infusions:  sodium chloride 10 mL/hr at 12/10/20 1641   ceFEPime (MAXIPIME) IV 2 g (12/11/20 1207)   metronidazole 500 mg (12/11/20 0835)     LOS: 11 days    Time spent: 35 mins    Saria Haran, MD [facility]   If 7PM-7AM, please contact night-coverage

## 2020-12-11 NOTE — Progress Notes (Signed)
Patient arrived to unit in bed, A&Ox4, English not primary language but understands it and is able to communicate effectively, vss, had a BM so he was cleaned up and dressing to peri-anal area was changed per order (cleaned with nss, packed with nss soaked gauze, covered w/dry gauze, abd pad), desitin applied to rash on bilateral buttocks, Dressings to abdomen where just changed prior to arrival on floor (per patient), midline abd dressing cdi, RLQ and LLQ 4x4 gauze dressings CDI, patient settled in the room, pain medication given, telemetry applied, all needs addressed at this time, will continue to monitor.

## 2020-12-12 LAB — GLUCOSE, CAPILLARY
Glucose-Capillary: 130 mg/dL — ABNORMAL HIGH (ref 70–99)
Glucose-Capillary: 119 mg/dL — ABNORMAL HIGH (ref 70–99)
Glucose-Capillary: 126 mg/dL — ABNORMAL HIGH (ref 70–99)
Glucose-Capillary: 129 mg/dL — ABNORMAL HIGH (ref 70–99)

## 2020-12-12 LAB — CBC
HCT: 32.2 % — ABNORMAL LOW (ref 39.0–52.0)
Hemoglobin: 9.7 g/dL — ABNORMAL LOW (ref 13.0–17.0)
MCH: 28.8 pg (ref 26.0–34.0)
MCHC: 30.1 g/dL (ref 30.0–36.0)
MCV: 95.5 fL (ref 80.0–100.0)
Platelets: 458 10*3/uL — ABNORMAL HIGH (ref 150–400)
RBC: 3.37 MIL/uL — ABNORMAL LOW (ref 4.22–5.81)
RDW: 19.1 % — ABNORMAL HIGH (ref 11.5–15.5)
WBC: 13.1 10*3/uL — ABNORMAL HIGH (ref 4.0–10.5)
nRBC: 0 % (ref 0.0–0.2)

## 2020-12-12 LAB — COMPREHENSIVE METABOLIC PANEL
ALT: 16 U/L (ref 0–44)
AST: 14 U/L — ABNORMAL LOW (ref 15–41)
Albumin: 2.7 g/dL — ABNORMAL LOW (ref 3.5–5.0)
Alkaline Phosphatase: 62 U/L (ref 38–126)
Anion gap: 6 (ref 5–15)
BUN: 17 mg/dL (ref 6–20)
CO2: 26 mmol/L (ref 22–32)
Calcium: 8.9 mg/dL (ref 8.9–10.3)
Chloride: 108 mmol/L (ref 98–111)
Creatinine, Ser: 0.58 mg/dL — ABNORMAL LOW (ref 0.61–1.24)
GFR, Estimated: >60 mL/min (ref >60)
Glucose, Bld: 135 mg/dL — ABNORMAL HIGH (ref 70–99)
Potassium: 4.1 mmol/L (ref 3.5–5.1)
Sodium: 140 mmol/L (ref 135–145)
Total Bilirubin: 0.6 mg/dL (ref 0.3–1.2)
Total Protein: 6.2 g/dL — ABNORMAL LOW (ref 6.5–8.1)

## 2020-12-12 LAB — PHOSPHORUS: Phosphorus: 3.2 mg/dL (ref 2.5–4.6)

## 2020-12-12 LAB — MAGNESIUM: Magnesium: 2.2 mg/dL (ref 1.7–2.4)

## 2020-12-12 MED ORDER — FENTANYL CITRATE (PF) 100 MCG/2ML IJ SOLN
12.5000 ug | Freq: Once | INTRAMUSCULAR | Status: AC
  Administered 2020-12-12: 12.5 ug via INTRAVENOUS
  Filled 2020-12-12: qty 2

## 2020-12-12 MED ORDER — SODIUM CHLORIDE 0.9 % IV SOLN
250.0000 mL | INTRAVENOUS | Status: DC
  Administered 2020-12-12 (×2): 250 mL via INTRAVENOUS

## 2020-12-12 MED ORDER — INSULIN ASPART 100 UNIT/ML IJ SOLN
0.0000 [IU] | Freq: Three times a day (TID) | INTRAMUSCULAR | Status: DC
  Administered 2020-12-12 – 2020-12-15 (×4): 1 [IU] via SUBCUTANEOUS
  Administered 2020-12-16: 2 [IU] via SUBCUTANEOUS
  Administered 2020-12-16: 1 [IU] via SUBCUTANEOUS
  Administered 2020-12-17: 2 [IU] via SUBCUTANEOUS
  Administered 2020-12-17 – 2020-12-19 (×3): 1 [IU] via SUBCUTANEOUS

## 2020-12-12 MED ORDER — FENTANYL CITRATE (PF) 100 MCG/2ML IJ SOLN
25.0000 ug | INTRAMUSCULAR | Status: DC | PRN
  Administered 2020-12-12 – 2020-12-18 (×25): 50 ug via INTRAVENOUS
  Filled 2020-12-12 (×25): qty 2

## 2020-12-12 NOTE — Progress Notes (Signed)
Progress Note  8 Days Post-Op  Subjective: Patient doing well, still having some diarrhea. Tolerating dressing changes. Discussed plans for fecal diversion tomorrow and he is in agreement to proceed. He is very motivated to try to get home next week.   Objective: Vital signs in last 24 hours: Temp:  [97.4 F (36.3 C)-98.4 F (36.9 C)] 98.4 F (36.9 C) (07/07 1321) Pulse Rate:  [74-86] 78 (07/07 1321) Resp:  [15-20] 20 (07/07 1321) BP: (93-98)/(58-66) 95/64 (07/07 1321) SpO2:  [95 %-98 %] 96 % (07/07 1321) Last BM Date: 12/11/20  Intake/Output from previous day: 07/06 0701 - 07/07 0700 In: 340 [P.O.:240; IV Piggyback:100] Out: 1475 [Urine:1475] Intake/Output this shift: No intake/output data recorded.  PE: Gen: Awake and alert, pleasant Lungs: Normal rate and effort Heart: RRR Abd: soft, NT, ND, suprapubic and infraumbilical wounds stable, lateral wounds shallow GU: foley present   Lab Results:  Recent Labs    12/11/20 0314 12/12/20 0432  WBC 13.2* 13.1*  HGB 9.5* 9.7*  HCT 31.4* 32.2*  PLT 513* 458*   BMET Recent Labs    12/11/20 0314 12/12/20 0432  NA 140 140  K 3.9 4.1  CL 106 108  CO2 26 26  GLUCOSE 121* 135*  BUN 15 17  CREATININE 0.50* 0.58*  CALCIUM 8.9 8.9   PT/INR No results for input(s): LABPROT, INR in the last 72 hours. CMP     Component Value Date/Time   NA 140 12/12/2020 0432   K 4.1 12/12/2020 0432   CL 108 12/12/2020 0432   CO2 26 12/12/2020 0432   GLUCOSE 135 (H) 12/12/2020 0432   BUN 17 12/12/2020 0432   CREATININE 0.58 (L) 12/12/2020 0432   CALCIUM 8.9 12/12/2020 0432   PROT 6.2 (L) 12/12/2020 0432   ALBUMIN 2.7 (L) 12/12/2020 0432   AST 14 (L) 12/12/2020 0432   ALT 16 12/12/2020 0432   ALKPHOS 62 12/12/2020 0432   BILITOT 0.6 12/12/2020 0432   GFRNONAA >60 12/12/2020 0432   Lipase     Component Value Date/Time   LIPASE 22 11/03/2020 1714       Studies/Results: No results  found.  Anti-infectives: Anti-infectives (From admission, onward)    Start     Dose/Rate Route Frequency Ordered Stop   12/04/20 2200  ceFEPIme (MAXIPIME) 2 g in sodium chloride 0.9 % 100 mL IVPB        2 g 200 mL/hr over 30 Minutes Intravenous Every 8 hours 12/04/20 1327     12/04/20 2200  metroNIDAZOLE (FLAGYL) IVPB 500 mg        500 mg 100 mL/hr over 60 Minutes Intravenous Every 12 hours 12/04/20 1327     12/02/20 1400  linezolid (ZYVOX) IVPB 600 mg  Status:  Discontinued        600 mg 300 mL/hr over 60 Minutes Intravenous Every 12 hours 12/02/20 1138 12/04/20 1327   11/30/20 1500  vancomycin (VANCOREADY) IVPB 1250 mg/250 mL  Status:  Discontinued        1,250 mg 166.7 mL/hr over 90 Minutes Intravenous Every 12 hours 11/30/20 0606 12/02/20 1138   11/30/20 0600  piperacillin-tazobactam (ZOSYN) IVPB 3.375 g  Status:  Discontinued        3.375 g 12.5 mL/hr over 240 Minutes Intravenous Every 8 hours 11/30/20 0237 12/04/20 1327   11/30/20 0403  piperacillin-tazobactam (ZOSYN) 3.375 GM/50ML IVPB       Note to Pharmacy: Hedy Camara   : cabinet override  11/30/20 0403 12/01/20 0206   11/30/20 0230  clindamycin (CLEOCIN) IVPB 600 mg  Status:  Discontinued        600 mg 100 mL/hr over 30 Minutes Intravenous  Once 11/30/20 0219 11/30/20 0237   11/30/20 0000  ceFEPIme (MAXIPIME) 2 g in sodium chloride 0.9 % 100 mL IVPB        2 g 200 mL/hr over 30 Minutes Intravenous  Once 11/29/20 2347 11/30/20 0033   11/30/20 0000  vancomycin (VANCOREADY) IVPB 1500 mg/300 mL        1,500 mg 150 mL/hr over 120 Minutes Intravenous  Once 11/29/20 2347 11/30/20 0300   11/29/20 2345  vancomycin (VANCOCIN) IVPB 1000 mg/200 mL premix  Status:  Discontinued        1,000 mg 200 mL/hr over 60 Minutes Intravenous  Once 11/29/20 2340 11/29/20 2347   11/29/20 2345  ceFEPIme (MAXIPIME) 1 g in sodium chloride 0.9 % 100 mL IVPB  Status:  Discontinued        1 g 200 mL/hr over 30 Minutes Intravenous  Once  11/29/20 2340 11/29/20 2347        Assessment/Plan POD12 s/p EUA, rectal wall bx, I&D of perirectal, scrotal, penile and abdominal regions for Fournier's gangrene - Dr. Johney Maine w/ Dr. Alinda Money - 11/30/20 POD11 s/p I&D of perirectal, scrotal, penile and abdominal regions for Fournier's gangrene, rectal stricturing/possible cancer - Dr. Johney Maine and Dr. Alinda Money - 12/01/20 POD8 s/p EUA w/ I&D for Fournier's gangrene - Dr. Hassell Done and Dr. Alinda Money - 12/04/20 - Wounds clean. No further debridement indicated at this time - BID WTD-please ensure that the perineal wound is being packed with a damp to dry dressing at least twice daily and after any bowel movements in order to endorse micro debridement of the accumulating fibrinous exudate - [place] for periwound fungal infection around right perineum incision . Avoid tape (use mesh undergarment). Keep periwound clean and dry - Cont abx.Cx w/ E. Coli, Enterobacter cloacae, Bacteroides Ovatus, Beta Lactamase + - Foley per urology - Appreciate CCM/TRH assistance - wounds cleaning up well - will evaluate wounds again Friday in OR  Adenocarcinoma of the rectum Rectal Stricturing - Noted from bx of rectal wall - GI consulted for colonoscopy - recommending waiting until perineal wounds have healed - plan fecal diversion with loop colostomy in the OR tomorrow  - CEA 16.7 - Oncology consult - Has had CT A/P during admission. Likely will need CT chest before discharge to r/o mets   FEN - Reg, NPO after MN VTE - SCDs, Lovenox ID - Cefepime/Flagyl Foley - in place   Shock - resolved Tobacco use ABL anemia w/ + FOBT - hgb stable at 8.8 on 7/3. On iron.   LOS: 12 days    Norm Parcel, [facility] Surgery 12/12/2020, 1:44 PM Please see Amion for pager number during day hours 7:00am-4:30pm

## 2020-12-12 NOTE — H&P (View-Only) (Signed)
Progress Note  8 Days Post-Op  Subjective: Patient doing well, still having some diarrhea. Tolerating dressing changes. Discussed plans for fecal diversion tomorrow and he is in agreement to proceed. He is very motivated to try to get home next week.   Objective: Vital signs in last 24 hours: Temp:  [97.4 F (36.3 C)-98.4 F (36.9 C)] 98.4 F (36.9 C) (07/07 1321) Pulse Rate:  [74-86] 78 (07/07 1321) Resp:  [15-20] 20 (07/07 1321) BP: (93-98)/(58-66) 95/64 (07/07 1321) SpO2:  [95 %-98 %] 96 % (07/07 1321) Last BM Date: 12/11/20  Intake/Output from previous day: 07/06 0701 - 07/07 0700 In: 340 [P.O.:240; IV Piggyback:100] Out: 1475 [Urine:1475] Intake/Output this shift: No intake/output data recorded.  PE: Gen: Awake and alert, pleasant Lungs: Normal rate and effort Heart: RRR Abd: soft, NT, ND, suprapubic and infraumbilical wounds stable, lateral wounds shallow GU: foley present   Lab Results:  Recent Labs    12/11/20 0314 12/12/20 0432  WBC 13.2* 13.1*  HGB 9.5* 9.7*  HCT 31.4* 32.2*  PLT 513* 458*   BMET Recent Labs    12/11/20 0314 12/12/20 0432  NA 140 140  K 3.9 4.1  CL 106 108  CO2 26 26  GLUCOSE 121* 135*  BUN 15 17  CREATININE 0.50* 0.58*  CALCIUM 8.9 8.9   PT/INR No results for input(s): LABPROT, INR in the last 72 hours. CMP     Component Value Date/Time   NA 140 12/12/2020 0432   K 4.1 12/12/2020 0432   CL 108 12/12/2020 0432   CO2 26 12/12/2020 0432   GLUCOSE 135 (H) 12/12/2020 0432   BUN 17 12/12/2020 0432   CREATININE 0.58 (L) 12/12/2020 0432   CALCIUM 8.9 12/12/2020 0432   PROT 6.2 (L) 12/12/2020 0432   ALBUMIN 2.7 (L) 12/12/2020 0432   AST 14 (L) 12/12/2020 0432   ALT 16 12/12/2020 0432   ALKPHOS 62 12/12/2020 0432   BILITOT 0.6 12/12/2020 0432   GFRNONAA >60 12/12/2020 0432   Lipase     Component Value Date/Time   LIPASE 22 11/03/2020 1714       Studies/Results: No results  found.  Anti-infectives: Anti-infectives (From admission, onward)    Start     Dose/Rate Route Frequency Ordered Stop   12/04/20 2200  ceFEPIme (MAXIPIME) 2 g in sodium chloride 0.9 % 100 mL IVPB        2 g 200 mL/hr over 30 Minutes Intravenous Every 8 hours 12/04/20 1327     12/04/20 2200  metroNIDAZOLE (FLAGYL) IVPB 500 mg        500 mg 100 mL/hr over 60 Minutes Intravenous Every 12 hours 12/04/20 1327     12/02/20 1400  linezolid (ZYVOX) IVPB 600 mg  Status:  Discontinued        600 mg 300 mL/hr over 60 Minutes Intravenous Every 12 hours 12/02/20 1138 12/04/20 1327   11/30/20 1500  vancomycin (VANCOREADY) IVPB 1250 mg/250 mL  Status:  Discontinued        1,250 mg 166.7 mL/hr over 90 Minutes Intravenous Every 12 hours 11/30/20 0606 12/02/20 1138   11/30/20 0600  piperacillin-tazobactam (ZOSYN) IVPB 3.375 g  Status:  Discontinued        3.375 g 12.5 mL/hr over 240 Minutes Intravenous Every 8 hours 11/30/20 0237 12/04/20 1327   11/30/20 0403  piperacillin-tazobactam (ZOSYN) 3.375 GM/50ML IVPB       Note to Pharmacy: Hedy Camara   : cabinet override  11/30/20 0403 12/01/20 0206   11/30/20 0230  clindamycin (CLEOCIN) IVPB 600 mg  Status:  Discontinued        600 mg 100 mL/hr over 30 Minutes Intravenous  Once 11/30/20 0219 11/30/20 0237   11/30/20 0000  ceFEPIme (MAXIPIME) 2 g in sodium chloride 0.9 % 100 mL IVPB        2 g 200 mL/hr over 30 Minutes Intravenous  Once 11/29/20 2347 11/30/20 0033   11/30/20 0000  vancomycin (VANCOREADY) IVPB 1500 mg/300 mL        1,500 mg 150 mL/hr over 120 Minutes Intravenous  Once 11/29/20 2347 11/30/20 0300   11/29/20 2345  vancomycin (VANCOCIN) IVPB 1000 mg/200 mL premix  Status:  Discontinued        1,000 mg 200 mL/hr over 60 Minutes Intravenous  Once 11/29/20 2340 11/29/20 2347   11/29/20 2345  ceFEPIme (MAXIPIME) 1 g in sodium chloride 0.9 % 100 mL IVPB  Status:  Discontinued        1 g 200 mL/hr over 30 Minutes Intravenous  Once  11/29/20 2340 11/29/20 2347        Assessment/Plan POD12 s/p EUA, rectal wall bx, I&D of perirectal, scrotal, penile and abdominal regions for Fournier's gangrene - Dr. Johney Maine w/ Dr. Alinda Money - 11/30/20 POD11 s/p I&D of perirectal, scrotal, penile and abdominal regions for Fournier's gangrene, rectal stricturing/possible cancer - Dr. Johney Maine and Dr. Alinda Money - 12/01/20 POD8 s/p EUA w/ I&D for Fournier's gangrene - Dr. Hassell Done and Dr. Alinda Money - 12/04/20 - Wounds clean. No further debridement indicated at this time - BID WTD-please ensure that the perineal wound is being packed with a damp to dry dressing at least twice daily and after any bowel movements in order to endorse micro debridement of the accumulating fibrinous exudate - [place] for periwound fungal infection around right perineum incision . Avoid tape (use mesh undergarment). Keep periwound clean and dry - Cont abx.Cx w/ E. Coli, Enterobacter cloacae, Bacteroides Ovatus, Beta Lactamase + - Foley per urology - Appreciate CCM/TRH assistance - wounds cleaning up well - will evaluate wounds again Friday in OR  Adenocarcinoma of the rectum Rectal Stricturing - Noted from bx of rectal wall - GI consulted for colonoscopy - recommending waiting until perineal wounds have healed - plan fecal diversion with loop colostomy in the OR tomorrow  - CEA 16.7 - Oncology consult - Has had CT A/P during admission. Likely will need CT chest before discharge to r/o mets   FEN - Reg, NPO after MN VTE - SCDs, Lovenox ID - Cefepime/Flagyl Foley - in place   Shock - resolved Tobacco use ABL anemia w/ + FOBT - hgb stable at 8.8 on 7/3. On iron.   LOS: 12 days    Norm Parcel, [facility] Surgery 12/12/2020, 1:44 PM Please see Amion for pager number during day hours 7:00am-4:30pm

## 2020-12-12 NOTE — Consult Note (Signed)
[place] Nurse requested for preoperative stoma site marking  Discussed surgical procedure and stoma creation with patient.  Explained role of the [place] nurse team.  Provided the patient with educational booklet and provided samples of pouching options.  Patient had no questions.  Examined patient lying and  sitting in order to place the marking in the patient's visual field, away from any creases or abdominal contour issues and within the rectus muscle.  Attempted to mark below the patient's belt line.   Marked for colostomy in the LUQ  ___3_ cm to the left of the umbilicus and even with the umbilicus.  Patient's abdomen cleansed with CHG wipes at site markings, allowed to air dry prior to marking.Covered mark with thin film transparent dressing to preserve mark until date of surgery.   [place] Nurse team will follow up with patient after surgery for continue ostomy care and teaching.  Val Riles, RN, MSN, CWOCN, CNS-BC, pager [phone]

## 2020-12-12 NOTE — Progress Notes (Signed)
Patient ID: James Coffey, male   DOB: [birth year], 50 y.o.   MRN: [id number]  8 Days Post-Op Subjective: Pt without new complaints.  Pain controlled.  Now transferred to the floor as he has stabilized hemodynamically.  Getting local wound care.  Objective: Vital signs in last 24 hours: Temp:  [97.4 F (36.3 C)-98.3 F (36.8 C)] 98.2 F (36.8 C) (07/07 0623) Pulse Rate:  [74-86] 74 (07/07 0623) Resp:  [15-20] 15 (07/07 0623) BP: (93-98)/(58-66) 95/60 (07/07 0623) SpO2:  [95 %-98 %] 98 % (07/07 0623)  Intake/Output from previous day: 07/06 0701 - 07/07 0700 In: 340 [P.O.:240; IV Piggyback:100] Out: 700 [Urine:700] Intake/Output this shift: No intake/output data recorded.  Physical Exam:  General: Alert and oriented GU: Scrotum and penis remain viable with significantly less edema compared to last week.  Foley in place.  Some erythema/induration of perineum near wound edge with stool in bed.  Lab Results: Recent Labs    12/11/20 0314 12/12/20 0432  HGB 9.5* 9.7*  HCT 31.4* 32.2*   CBC Latest Ref Rng & Units 12/12/2020 12/11/2020 12/08/2020  WBC 4.0 - 10.5 K/uL 13.1(H) 13.2(H) 10.8(H)  Hemoglobin 13.0 - 17.0 g/dL 9.7(L) 9.5(L) 8.8(L)  Hematocrit 39.0 - 52.0 % 32.2(L) 31.4(L) 28.4(L)  Platelets 150 - 400 K/uL 458(H) 513(H) 494(H)     BMET Recent Labs    12/11/20 0314 12/12/20 0432  NA 140 140  K 3.9 4.1  CL 106 108  CO2 26 26  GLUCOSE 121* 135*  BUN 15 17  CREATININE 0.50* 0.58*  CALCIUM 8.9 8.9     Studies/Results: No results found.  Assessment/Plan: 1) Necrotizing fasciitis: Genital skin continues to be viable and it does not appear further debridement of genitals will be necessary.  Due to exposure of urethra within his perineal wound, recommend continuing Foley catheter for now.  Will re-examine next week at one of his wound changes and continue to periodically evaluate when it may be appropriate to consider Foley removal.  Please call if GU questions in the  meantime.   LOS: 12 days   Dutch Gray 12/12/2020, 6:56 AM

## 2020-12-12 NOTE — Progress Notes (Signed)
PROGRESS NOTE    James Coffey  KGU:[id number] DOB: 1971-01-27 DOA: 11/29/2020 PCP: Pcp, No  Brief Narrative:  This 50 years old male with PMH significant for tobacco abuse, history of rectal cancer presented in the ED with septic shock secondary to Fournier's gangrene of the perirectal, scrotal, penile and abdominal regions.  Patient was initially admitted in the ICU,  he was requiring IV pressors to maintain blood pressure.  Blood pressure has improved.  He is off pressors now,  he underwent multiple incision and drainage and surgical debridements,  general surgery and urology is following.  Patient is on IV antibiotics.  He is started on midodrine for blood pressure control.  Significant hospital events. 6/25 I&D and debridement. Central line placement 6/26 Second I&D 6/28 Hypokalemic overnight, Levophed slightly increased to 65mcg 6/29 third I&D 6/30 remains on levo, great urine output. Received one unit PRBC overnight for hgb 7.1 7/1 PICC line placed and CVC removed overnight, pressor requirement decreased with addition of midodrine D/c'd levophed.    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 He was initially admitted in ICU , requiring Levophed support. General surgery was consulted,  He underwent multiple surgical debridements on 6/25, 6/26, 6/29. Continue IV antibiotics (cefepime and Flagyl). Continue adequate pain control with oxycodone. Daily wound assessment, continue wound care Continue midodrine 10 mg 3 times daily Patient is off Levophed.  Sepsis physiology is  improving. Shock is resolved. Encourage oral hydration.  Adenocarcinoma of rectum. It was noted during debridement of Fournier's gangrene. GI following, recommended colonoscopy once patient is medically stable. Patient requested clinical staff not to discuss cancer diagnosis with his family members. Avoid rectal tube as per PCCM recommendation.  Patient will eventually need CT chest at some point to rule out metastatic disease. Fecal diversion is not planned at this time as the patient is not obstructed. Needs oncology consult. Continue Foley catheter per urology.  Tobacco use disorder: Nicotine patch.  Normocytic normochromic anemia. Hb remained stable,  continue iron supplementation.  DVT prophylaxis: Lovenox Code Status: Full code. Family Communication: No family at bed side. Disposition Plan:   Status is: Inpatient  Remains inpatient appropriate because:Inpatient level of care appropriate due to severity of illness  Dispo: The patient is from: Home              Anticipated d/c is to: Home              Patient currently is not medically stable to d/c.   Difficult to place patient No  Consultants:  General surgery, urology, PCCM  Procedures: Multiple incision and drainage and debridements  Antimicrobials:   Anti-infectives (From admission, onward)    Start     Dose/Rate Route Frequency Ordered Stop   12/04/20 2200  ceFEPIme (MAXIPIME) 2 g in sodium chloride 0.9 % 100 mL IVPB        2 g 200 mL/hr over 30 Minutes Intravenous Every 8 hours 12/04/20 1327     12/04/20 2200  metroNIDAZOLE (FLAGYL) IVPB 500 mg        500 mg 100 mL/hr  over 60 Minutes Intravenous Every 12 hours 12/04/20 1327     12/02/20 1400  linezolid (ZYVOX) IVPB 600 mg  Status:  Discontinued        600 mg 300 mL/hr over 60 Minutes Intravenous Every 12 hours 12/02/20 1138 12/04/20 1327   11/30/20 1500  vancomycin (VANCOREADY) IVPB 1250 mg/250 mL  Status:  Discontinued        1,250 mg 166.7 mL/hr over  90 Minutes Intravenous Every 12 hours 11/30/20 0606 12/02/20 1138   11/30/20 0600  piperacillin-tazobactam (ZOSYN) IVPB 3.375 g  Status:  Discontinued        3.375 g 12.5 mL/hr over 240 Minutes Intravenous Every 8 hours 11/30/20 0237 12/04/20 1327   11/30/20 0403  piperacillin-tazobactam (ZOSYN) 3.375 GM/50ML IVPB       Note to Pharmacy: Hedy Camara   : cabinet override      11/30/20 0403 12/01/20 0206   11/30/20 0230  clindamycin (CLEOCIN) IVPB 600 mg  Status:  Discontinued        600 mg 100 mL/hr over 30 Minutes Intravenous  Once 11/30/20 0219 11/30/20 0237   11/30/20 0000  ceFEPIme (MAXIPIME) 2 g in sodium chloride 0.9 % 100 mL IVPB        2 g 200 mL/hr over 30 Minutes Intravenous  Once 11/29/20 2347 11/30/20 0033   11/30/20 0000  vancomycin (VANCOREADY) IVPB 1500 mg/300 mL        1,500 mg 150 mL/hr over 120 Minutes Intravenous  Once 11/29/20 2347 11/30/20 0300   11/29/20 2345  vancomycin (VANCOCIN) IVPB 1000 mg/200 mL premix  Status:  Discontinued        1,000 mg 200 mL/hr over 60 Minutes Intravenous  Once 11/29/20 2340 11/29/20 2347   11/29/20 2345  ceFEPIme (MAXIPIME) 1 g in sodium chloride 0.9 % 100 mL IVPB  Status:  Discontinued        1 g 200 mL/hr over 30 Minutes Intravenous  Once 11/29/20 2340 11/29/20 2347        Subjective: Patient was seen and examined at bedside.  Overnight events noted.  Patient reports feeling much improved.   Blood pressure has improved.  Wounds are healing better.  He denies any other concerns.  Objective: Vitals:   12/11/20 1429 12/11/20 2118 12/12/20 0623 12/12/20 1321  BP: (!) 93/58 98/66 95/60  95/64  Pulse: 86 81 74 78  Resp: 20 20 15 20   Temp: 98.3 F (36.8 C) (!) 97.4 F (36.3 C) 98.2 F (36.8 C) 98.4 F (36.9 C)  TempSrc: Oral  Oral Oral  SpO2: 96% 95% 98% 96%  Weight:      Height:        Intake/Output Summary (Last 24 hours) at 12/12/2020 1359 Last data filed at 12/12/2020 0615 Gross per 24 hour  Intake --  Output 1475 ml   Net -1475 ml   Filed Weights   11/30/20 0800 12/10/20 1703  Weight: 75.1 kg 64 kg    Examination:  General exam: Appears calm and comfortable , not in any acute distress. Respiratory system: Clear to auscultation. Respiratory effort normal. Cardiovascular system: S1 & S2 heard, RRR. No JVD, murmurs, rubs, gallops or clicks. No pedal edema. Gastrointestinal system: Abdomen is nondistended, soft and mildly tender, no organomegaly or masses felt.  Normal bowel sounds heard. Multiple wounds noted but which are covered in dressing. Central nervous system: Alert and oriented. No focal neurological deficits. Extremities: Symmetric 5 x 5 power. Skin: No rashes, lesions or ulcers Psychiatry: Judgement  and insight appear normal. Mood & affect appropriate.     Data Reviewed: I have personally reviewed following labs and imaging studies  CBC: Recent Labs  Lab 12/06/20 0548 12/07/20 0500 12/08/20 0500 12/11/20 0314 12/12/20 0432  WBC 12.8* 13.1* 10.8* 13.2* 13.1*  NEUTROABS  --   --   --  11.0*  --   HGB 8.1* 8.7* 8.8* 9.5* 9.7*  HCT 25.8* 28.1* 28.4* 31.4* 32.2*  MCV 91.5 92.7 91.6 95.2 95.5  PLT 436* 489* 494* 513* 466*   Basic Metabolic Panel: Recent Labs  Lab 12/08/20 0500 12/11/20 0314 12/12/20 0432  NA 139 140 140  K 3.8 3.9 4.1  CL 106 106 108  CO2 26 26 26   GLUCOSE 163* 121* 135*  BUN 11 15 17   CREATININE 0.54* 0.50* 0.58*  CALCIUM 8.9 8.9 8.9  MG  --  2.6* 2.2  PHOS  --   --  3.2   GFR: Estimated Creatinine Clearance: 98.9 mL/min (A) (by C-G formula based on SCr of 0.58 mg/dL (L)). Liver Function Tests: Recent Labs  Lab 12/08/20 0500 12/12/20 0432  AST 12* 14*  ALT 23 16  ALKPHOS 58 62  BILITOT 0.4 0.6  PROT 6.5 6.2*  ALBUMIN 2.5* 2.7*   No results for input(s): LIPASE, AMYLASE in the last 168 hours. No results for input(s): AMMONIA in the last 168 hours. Coagulation Profile: No results for input(s): INR, PROTIME in the last 168 hours. Cardiac  Enzymes: No results for input(s): CKTOTAL, CKMB, CKMBINDEX, TROPONINI in the last 168 hours. BNP (last 3 results) No results for input(s): PROBNP in the last 8760 hours. HbA1C: No results for input(s): HGBA1C in the last 72 hours. CBG: Recent Labs  Lab 12/11/20 1152 12/11/20 1702 12/11/20 2130 12/12/20 0731 12/12/20 1112  GLUCAP 158* 169* 148* 130* 119*   Lipid Profile: No results for input(s): CHOL, HDL, LDLCALC, TRIG, CHOLHDL, LDLDIRECT in the last 72 hours. Thyroid Function Tests: No results for input(s): TSH, T4TOTAL, FREET4, T3FREE, THYROIDAB in the last 72 hours. Anemia Panel: No results for input(s): VITAMINB12, FOLATE, FERRITIN, TIBC, IRON, RETICCTPCT in the last 72 hours. Sepsis Labs: No results for input(s): PROCALCITON, LATICACIDVEN in the last 168 hours.  Recent Results (from the past 240 hour(s))  MRSA Next Gen by PCR, Nasal     Status: None   Collection Time: 12/08/20  8:38 AM   Specimen: Nasal Mucosa; Nasal Swab  Result Value Ref Range Status   MRSA by PCR Next Gen NOT DETECTED NOT DETECTED Final    Comment: (NOTE) The GeneXpert MRSA Assay (FDA approved for NASAL specimens only), is one component of a comprehensive MRSA colonization surveillance program. It is not intended to diagnose MRSA infection nor to guide or monitor treatment for MRSA infections. Test performance is not FDA approved in patients less than 6 years old. Performed at [facility], [place] [address] [place], [place] [address]     Radiology Studies: No results found.  Scheduled Meds:  (feeding supplement) PROSource Plus  30 mL Oral BID BM   vitamin C  500 mg Oral BID   bacitracin   Topical BID   Chlorhexidine Gluconate Cloth  6 each Topical Daily   cholestyramine light  4 g Oral QID   enoxaparin (LOVENOX) injection  40 mg Subcutaneous Daily   feeding supplement  237 mL Oral BID BM   ferrous sulfate  325 mg Oral Q breakfast   hydrocortisone  30 mg Oral BID  insulin aspart  1-3 Units Subcutaneous TID WC   lip balm  1 application Topical BID   liver oil-zinc oxide   Topical BID   mouth rinse  15 mL Mouth Rinse BID   midodrine  10 mg Oral TID WC   multivitamin with minerals  1 tablet Oral Daily   nicotine  21 mg Transdermal Daily   nutrition supplement (JUVEN)  1 packet Oral BID BM   oxyCODONE  5 mg Oral Q4H   pantoprazole  40 mg Oral QHS   sodium chloride flush  10-40 mL Intracatheter Q12H   zinc sulfate  220 mg Oral Daily   Continuous Infusions:  sodium chloride 10 mL/hr at 12/10/20 1641   ceFEPime (MAXIPIME) IV 2 g (12/12/20 0622)   metronidazole 500 mg (12/12/20 1006)     LOS: 12 days    Time spent: 25 mins    Rajvi Armentor, MD [facility]   If 7PM-7AM, please contact night-coverage

## 2020-12-13 ENCOUNTER — Encounter (HOSPITAL_COMMUNITY)
Admission: EM | Disposition: A | Discharge status: Home Health | Payer: Self-pay | Source: Home / Self Care | Attending: Family Medicine

## 2020-12-13 ENCOUNTER — Other Ambulatory Visit: Payer: Self-pay

## 2020-12-13 ENCOUNTER — Inpatient Hospital Stay (HOSPITAL_COMMUNITY): Payer: Self-pay | Admitting: Certified Registered Nurse Anesthetist

## 2020-12-13 ENCOUNTER — Encounter (HOSPITAL_COMMUNITY): Payer: Self-pay

## 2020-12-13 HISTORY — PX: LAPAROSCOPIC LOOP COLOSTOMY: SHX6816

## 2020-12-13 LAB — BASIC METABOLIC PANEL
Anion gap: 5 (ref 5–15)
BUN: 16 mg/dL (ref 6–20)
CO2: 27 mmol/L (ref 22–32)
Calcium: 8.9 mg/dL (ref 8.9–10.3)
Chloride: 107 mmol/L (ref 98–111)
Creatinine, Ser: 0.5 mg/dL — ABNORMAL LOW (ref 0.61–1.24)
GFR, Estimated: >60 mL/min (ref >60)
Glucose, Bld: 128 mg/dL — ABNORMAL HIGH (ref 70–99)
Potassium: 4 mmol/L (ref 3.5–5.1)
Sodium: 139 mmol/L (ref 135–145)

## 2020-12-13 LAB — CBC
HCT: 33.5 % — ABNORMAL LOW (ref 39.0–52.0)
Hemoglobin: 9.9 g/dL — ABNORMAL LOW (ref 13.0–17.0)
MCH: 28.7 pg (ref 26.0–34.0)
MCHC: 29.6 g/dL — ABNORMAL LOW (ref 30.0–36.0)
MCV: 97.1 fL (ref 80.0–100.0)
Platelets: 478 10*3/uL — ABNORMAL HIGH (ref 150–400)
RBC: 3.45 MIL/uL — ABNORMAL LOW (ref 4.22–5.81)
RDW: 18.6 % — ABNORMAL HIGH (ref 11.5–15.5)
WBC: 10.8 10*3/uL — ABNORMAL HIGH (ref 4.0–10.5)
nRBC: 0 % (ref 0.0–0.2)

## 2020-12-13 LAB — MAGNESIUM: Magnesium: 2.2 mg/dL (ref 1.7–2.4)

## 2020-12-13 LAB — GLUCOSE, CAPILLARY
Glucose-Capillary: 97 mg/dL (ref 70–99)
Glucose-Capillary: 88 mg/dL (ref 70–99)
Glucose-Capillary: 110 mg/dL — ABNORMAL HIGH (ref 70–99)
Glucose-Capillary: 141 mg/dL — ABNORMAL HIGH (ref 70–99)
Glucose-Capillary: 135 mg/dL — ABNORMAL HIGH (ref 70–99)

## 2020-12-13 LAB — PHOSPHORUS: Phosphorus: 3.3 mg/dL (ref 2.5–4.6)

## 2020-12-13 SURGERY — CREATION, COLOSTOMY, LOOP, LAPAROSCOPIC
Anesthesia: General

## 2020-12-13 MED ORDER — OXYCODONE HCL 5 MG PO TABS
ORAL_TABLET | ORAL | Status: AC
  Administered 2020-12-13: 5 mg via ORAL
  Filled 2020-12-13: qty 1

## 2020-12-13 MED ORDER — OXYCODONE HCL 5 MG PO TABS
5.0000 mg | ORAL_TABLET | ORAL | Status: DC | PRN
  Administered 2020-12-13 – 2020-12-16 (×8): 10 mg via ORAL
  Administered 2020-12-17: 5 mg via ORAL
  Administered 2020-12-19 (×2): 10 mg via ORAL
  Filled 2020-12-13 (×2): qty 2
  Filled 2020-12-13: qty 1
  Filled 2020-12-13 (×9): qty 2

## 2020-12-13 MED ORDER — ROCURONIUM BROMIDE 10 MG/ML (PF) SYRINGE
PREFILLED_SYRINGE | INTRAVENOUS | Status: AC
  Filled 2020-12-13: qty 10

## 2020-12-13 MED ORDER — ROCURONIUM BROMIDE 10 MG/ML (PF) SYRINGE
PREFILLED_SYRINGE | INTRAVENOUS | Status: DC | PRN
  Administered 2020-12-13 (×2): 20 mg via INTRAVENOUS
  Administered 2020-12-13: 60 mg via INTRAVENOUS

## 2020-12-13 MED ORDER — PROPOFOL 10 MG/ML IV BOLUS
INTRAVENOUS | Status: DC | PRN
  Administered 2020-12-13: 110 mg via INTRAVENOUS

## 2020-12-13 MED ORDER — DEXAMETHASONE SODIUM PHOSPHATE 10 MG/ML IJ SOLN
INTRAMUSCULAR | Status: AC
  Filled 2020-12-13: qty 1

## 2020-12-13 MED ORDER — PROPOFOL 10 MG/ML IV BOLUS
INTRAVENOUS | Status: AC
  Filled 2020-12-13: qty 20

## 2020-12-13 MED ORDER — OXYCODONE HCL 5 MG/5ML PO SOLN: 5.0000 mg | Freq: Once | ORAL | Status: AC | PRN

## 2020-12-13 MED ORDER — HYDROMORPHONE HCL 1 MG/ML IJ SOLN
INTRAMUSCULAR | Status: AC
  Administered 2020-12-13: 0.25 mg via INTRAVENOUS
  Filled 2020-12-13: qty 1

## 2020-12-13 MED ORDER — OXYCODONE HCL 5 MG PO TABS
5.0000 mg | ORAL_TABLET | Freq: Once | ORAL | Status: AC | PRN
Start: 2020-12-13 — End: 2020-12-13

## 2020-12-13 MED ORDER — FENTANYL CITRATE (PF) 100 MCG/2ML IJ SOLN
INTRAMUSCULAR | Status: AC
  Filled 2020-12-13: qty 2

## 2020-12-13 MED ORDER — PHENYLEPHRINE 40 MCG/ML (10ML) SYRINGE FOR IV PUSH (FOR BLOOD PRESSURE SUPPORT)
PREFILLED_SYRINGE | INTRAVENOUS | Status: AC
  Filled 2020-12-13: qty 10

## 2020-12-13 MED ORDER — DEXAMETHASONE SODIUM PHOSPHATE 4 MG/ML IJ SOLN
INTRAMUSCULAR | Status: DC | PRN
  Administered 2020-12-13: 10 mg via INTRAVENOUS

## 2020-12-13 MED ORDER — MEPERIDINE HCL 50 MG/ML IJ SOLN: 6.2500 mg | INTRAMUSCULAR | Status: DC | PRN

## 2020-12-13 MED ORDER — PROMETHAZINE HCL 25 MG/ML IJ SOLN
INTRAMUSCULAR | Status: AC
  Administered 2020-12-13: 6.25 mg via INTRAVENOUS
  Filled 2020-12-13: qty 1

## 2020-12-13 MED ORDER — ONDANSETRON HCL 4 MG/2ML IJ SOLN
INTRAMUSCULAR | Status: DC | PRN
  Administered 2020-12-13: 4 mg via INTRAVENOUS

## 2020-12-13 MED ORDER — 0.9 % SODIUM CHLORIDE (POUR BTL) OPTIME
TOPICAL | Status: DC | PRN
  Administered 2020-12-13: 1000 mL

## 2020-12-13 MED ORDER — PHENYLEPHRINE 40 MCG/ML (10ML) SYRINGE FOR IV PUSH (FOR BLOOD PRESSURE SUPPORT)
PREFILLED_SYRINGE | INTRAVENOUS | Status: DC | PRN
  Administered 2020-12-13 (×3): 120 ug via INTRAVENOUS
  Administered 2020-12-13: 80 ug via INTRAVENOUS
  Administered 2020-12-13: 120 ug via INTRAVENOUS

## 2020-12-13 MED ORDER — LIDOCAINE 2% (20 MG/ML) 5 ML SYRINGE
INTRAMUSCULAR | Status: AC
  Filled 2020-12-13: qty 5

## 2020-12-13 MED ORDER — HYDROMORPHONE HCL 1 MG/ML IJ SOLN
INTRAMUSCULAR | Status: AC
  Administered 2020-12-13: 0.5 mg via INTRAVENOUS
  Filled 2020-12-13: qty 1

## 2020-12-13 MED ORDER — LIDOCAINE 2% (20 MG/ML) 5 ML SYRINGE
INTRAMUSCULAR | Status: DC | PRN
  Administered 2020-12-13: 50 mg via INTRAVENOUS

## 2020-12-13 MED ORDER — AMISULPRIDE (ANTIEMETIC) 5 MG/2ML IV SOLN
10.0000 mg | Freq: Once | INTRAVENOUS | Status: AC | PRN
  Administered 2020-12-13: 10 mg via INTRAVENOUS

## 2020-12-13 MED ORDER — AMISULPRIDE (ANTIEMETIC) 5 MG/2ML IV SOLN
INTRAVENOUS | Status: AC
  Filled 2020-12-13: qty 4

## 2020-12-13 MED ORDER — PROMETHAZINE HCL 25 MG/ML IJ SOLN
6.2500 mg | INTRAMUSCULAR | Status: AC | PRN
  Administered 2020-12-13: 6.25 mg via INTRAVENOUS

## 2020-12-13 MED ORDER — FENTANYL CITRATE (PF) 100 MCG/2ML IJ SOLN
INTRAMUSCULAR | Status: DC | PRN
  Administered 2020-12-13 (×2): 50 ug via INTRAVENOUS
  Administered 2020-12-13: 100 ug via INTRAVENOUS
  Administered 2020-12-13: 50 ug via INTRAVENOUS
  Administered 2020-12-13: 100 ug via INTRAVENOUS

## 2020-12-13 MED ORDER — MIDAZOLAM HCL 5 MG/5ML IJ SOLN
INTRAMUSCULAR | Status: DC | PRN
  Administered 2020-12-13: 2 mg via INTRAVENOUS

## 2020-12-13 MED ORDER — LACTATED RINGERS IV SOLN: INTRAVENOUS | Status: DC

## 2020-12-13 MED ORDER — SUGAMMADEX SODIUM 200 MG/2ML IV SOLN
INTRAVENOUS | Status: DC | PRN
  Administered 2020-12-13: 200 mg via INTRAVENOUS

## 2020-12-13 MED ORDER — MIDAZOLAM HCL 2 MG/2ML IJ SOLN
INTRAMUSCULAR | Status: AC
  Filled 2020-12-13: qty 2

## 2020-12-13 MED ORDER — FENTANYL CITRATE (PF) 250 MCG/5ML IJ SOLN
INTRAMUSCULAR | Status: AC
  Filled 2020-12-13: qty 5

## 2020-12-13 MED ORDER — HYDROMORPHONE HCL 1 MG/ML IJ SOLN
0.2500 mg | INTRAMUSCULAR | Status: DC | PRN
Start: 2020-12-13 — End: 2020-12-13
  Administered 2020-12-13: 0.25 mg via INTRAVENOUS
  Administered 2020-12-13 (×2): 0.5 mg via INTRAVENOUS

## 2020-12-13 MED ORDER — ONDANSETRON HCL 4 MG/2ML IJ SOLN
INTRAMUSCULAR | Status: AC
  Filled 2020-12-13: qty 2

## 2020-12-13 SURGICAL SUPPLY — 52 items
APPLICATOR COTTON TIP 6 STRL (MISCELLANEOUS) ×2 IMPLANT
APPLICATOR COTTON TIP 6IN STRL (MISCELLANEOUS) ×4 IMPLANT
BAG COUNTER SPONGE SURGICOUNT (BAG) IMPLANT
BLADE EXTENDED COATED 6.5IN (ELECTRODE) ×1 IMPLANT
BLADE HEX COATED 2.75 (ELECTRODE) ×2 IMPLANT
BLADE SURG SZ10 CARB STEEL (BLADE) ×2 IMPLANT
CHLORAPREP W/TINT 26 (MISCELLANEOUS) ×2 IMPLANT
CLIP VESOCCLUDE LG 6/CT (CLIP) IMPLANT
COVER MAYO STAND STRL (DRAPES) ×2 IMPLANT
COVER SURGICAL LIGHT HANDLE (MISCELLANEOUS) ×2 IMPLANT
DRAPE LAPAROSCOPIC ABDOMINAL (DRAPES) ×2 IMPLANT
DRAPE SHEET LG 3/4 BI-LAMINATE (DRAPES) IMPLANT
DRAPE WARM FLUID 44X44 (DRAPES) ×2 IMPLANT
ELECT REM PT RETURN 15FT ADLT (MISCELLANEOUS) ×2 IMPLANT
GAUZE SPONGE 4X4 12PLY STRL (GAUZE/BANDAGES/DRESSINGS) ×2 IMPLANT
GLOVE SURG UNDER POLY LF SZ7 (GLOVE) ×2 IMPLANT
GOWN STRL REUS W/TWL LRG LVL3 (GOWN DISPOSABLE) ×2 IMPLANT
GOWN STRL REUS W/TWL XL LVL3 (GOWN DISPOSABLE) ×4 IMPLANT
HANDLE SUCTION POOLE (INSTRUMENTS) ×1 IMPLANT
KIT BASIN OR (CUSTOM PROCEDURE TRAY) ×2 IMPLANT
KIT TURNOVER KIT A (KITS) ×2 IMPLANT
LEGGING LITHOTOMY PAIR STRL (DRAPES) ×2 IMPLANT
NS IRRIG 1000ML POUR BTL (IV SOLUTION) ×4 IMPLANT
PACK GENERAL/GYN (CUSTOM PROCEDURE TRAY) ×2 IMPLANT
PENCIL SMOKE EVACUATOR (MISCELLANEOUS) IMPLANT
SHEARS HARMONIC ACE PLUS 36CM (ENDOMECHANICALS) IMPLANT
STAPLER VISISTAT (STAPLE) ×1 IMPLANT
STAPLER VISISTAT 35W (STAPLE) ×2 IMPLANT
SUCTION POOLE HANDLE (INSTRUMENTS) ×2
SUT NOV 1 T60/GS (SUTURE) IMPLANT
SUT NOVA T20/GS 25 (SUTURE) ×4 IMPLANT
SUT PDS AB 1 CT1 27 (SUTURE) ×2 IMPLANT
SUT PDS AB 1 CTX 36 (SUTURE) ×4 IMPLANT
SUT PDS AB 1 TP1 54 (SUTURE) IMPLANT
SUT PROLENE 2 0 KS (SUTURE) IMPLANT
SUT SILK 2 0 (SUTURE) ×2
SUT SILK 2 0 SH CR/8 (SUTURE) ×2 IMPLANT
SUT SILK 2 0SH CR/8 30 (SUTURE) IMPLANT
SUT SILK 2-0 18XBRD TIE 12 (SUTURE) ×1 IMPLANT
SUT SILK 2-0 30XBRD TIE 12 (SUTURE) IMPLANT
SUT SILK 3 0 (SUTURE) ×2
SUT SILK 3 0 SH CR/8 (SUTURE) ×2 IMPLANT
SUT SILK 3-0 18XBRD TIE 12 (SUTURE) ×1 IMPLANT
SUT VIC AB 3-0 SH 8-18 (SUTURE) ×2 IMPLANT
TAPE PAPER 1X10 WHT MICROPORE (GAUZE/BANDAGES/DRESSINGS) ×1 IMPLANT
TOWEL OR 17X26 10 PK STRL BLUE (TOWEL DISPOSABLE) ×4 IMPLANT
TOWEL OR NON WOVEN STRL DISP B (DISPOSABLE) ×2 IMPLANT
TRAY FOLEY MTR SLVR 14FR STAT (SET/KITS/TRAYS/PACK) IMPLANT
TRAY FOLEY MTR SLVR 16FR STAT (SET/KITS/TRAYS/PACK) IMPLANT
TRAY PREP A LATEX SAFE STRL (SET/KITS/TRAYS/PACK) ×1 IMPLANT
WATER STERILE IRR 1000ML POUR (IV SOLUTION) IMPLANT
YANKAUER SUCT BULB TIP NO VENT (SUCTIONS) ×2 IMPLANT

## 2020-12-13 NOTE — Anesthesia Preprocedure Evaluation (Signed)
Anesthesia Evaluation  Patient identified by MRN, date of birth, ID band Patient awake    Reviewed: Allergy & Precautions, NPO status , Patient's Chart, lab work & pertinent test results  History of Anesthesia Complications Negative for: history of anesthetic complications  Airway Mallampati: I  TM Distance: >3 FB Neck ROM: Full    Dental  (+) Edentulous Lower, Dental Advisory Given, Edentulous Upper,    Pulmonary Current Smoker and Patient abstained from smoking.,    Pulmonary exam normal        Cardiovascular negative cardio ROS Normal cardiovascular exam     Neuro/Psych negative neurological ROS  negative psych ROS   GI/Hepatic negative GI ROS, Neg liver ROS,   Endo/Other  negative endocrine ROS  Renal/GU negative Renal ROS     Musculoskeletal   Abdominal   Peds  Hematology   Anesthesia Other Findings Rectal Cancer  Reproductive/Obstetrics                             Anesthesia Physical  Anesthesia Plan  ASA: 3  Anesthesia Plan: General   Post-op Pain Management:    Induction: Intravenous  PONV Risk Score and Plan: 1 and Ondansetron and Treatment may vary due to age or medical condition  Airway Management Planned: Oral ETT  Additional Equipment: None  Intra-op Plan:   Post-operative Plan: Extubation in OR  Informed Consent: I have reviewed the patients History and Physical, chart, labs and discussed the procedure including the risks, benefits and alternatives for the proposed anesthesia with the patient or authorized representative who has indicated his/her understanding and acceptance.     Dental advisory given  Plan Discussed with: Anesthesiologist and CRNA  Anesthesia Plan Comments:         Anesthesia Quick Evaluation

## 2020-12-13 NOTE — Consult Note (Signed)
[place] Nurse ostomy follow up Patient back to room from PACU, [place].  Daughter in room. Stoma type/location: LUQ loop colostomy Stomal assessment/size: moist, red, red rubber support loop in place.  I much appreciate Dr. Gala Lewandowsky use of the loop support device. Peristomal assessment: deferred Treatment options for stomal/peristomal skin: barrier ring Output: none at this time Ostomy pouching: 2pc. 2 and 3/4 inch pouch and skin barrier ordered by [place] for placement in the patient's room when they arrive Education provided: Explained to the patient's daughter that teaching for ostomy care will begin next week.  I provided her with a [facility] colostomy education folder and contents for her and her father to review over the weekend. Enrolled patient in [place] Start Discharge program: No  Val Riles, RN, MSN, [facility], CNS-BC, pager [phone]

## 2020-12-13 NOTE — Anesthesia Postprocedure Evaluation (Signed)
Anesthesia Post Note  Patient: Audiological scientist  Procedure(s) Performed: DIAGNOSTIC LAPAROSCOPY WITH  OPEN DESCENDING LOOP COLOSTOMY     Patient location during evaluation: PACU Anesthesia Type: General Level of consciousness: awake and alert Pain management: pain level controlled Vital Signs Assessment: post-procedure vital signs reviewed and stable Respiratory status: spontaneous breathing, nonlabored ventilation and respiratory function stable Cardiovascular status: blood pressure returned to baseline and stable Postop Assessment: no apparent nausea or vomiting Anesthetic complications: no   No notable events documented.  Last Vitals:  Vitals:   12/13/20 0920 12/13/20 1232  BP: 95/70 111/74  Pulse: 96 85  Resp: 16 15  Temp: 36.7 C 36.9 C  SpO2: 96% 99%    Last Pain:  Vitals:   12/13/20 1232  TempSrc:   PainSc: 0-No pain                 Lynda Rainwater

## 2020-12-13 NOTE — Progress Notes (Signed)
PT Cancellation Note  Patient Details Name: James Coffey MRN: [id number] DOB: 1970/10/15   Cancelled Treatment:     pt scheduled for surgery today. Will attempt to see another day as schedule permits.     Nathanial Rancher 12/13/2020, 9:14 AM

## 2020-12-13 NOTE — Transfer of Care (Signed)
Immediate Anesthesia Transfer of Care Note  Patient: James Coffey  Procedure(s) Performed: DIAGNOSTIC LAPAROSCOPY WITH  OPEN DESCENDING LOOP COLOSTOMY  Patient Location: PACU  Anesthesia Type:General  Level of Consciousness: awake and patient cooperative  Airway & Oxygen Therapy: Patient Spontanous Breathing and Patient connected to face mask  Post-op Assessment: Report given to RN and Post -op Vital signs reviewed and stable  Post vital signs: Reviewed and stable  Last Vitals:  Vitals Value Taken Time  BP 111/74 12/13/20 1232  Temp    Pulse 84 12/13/20 1234  Resp 15 12/13/20 1234  SpO2 97 % 12/13/20 1234  Vitals shown include unvalidated device data.  Last Pain:  Vitals:   12/13/20 0920  TempSrc: Oral  PainSc: 0-No pain      Patients Stated Pain Goal: 3 (56/70/14 1030)  Complications: No notable events documented.

## 2020-12-13 NOTE — Progress Notes (Signed)
PROGRESS NOTE    James Coffey  OVF:[id number] DOB: 1971-01-19 DOA: 11/29/2020 PCP: Pcp, No  Brief Narrative:  This 50 years old male with PMH significant for tobacco abuse, history of rectal cancer presented in the ED with septic shock secondary to Fournier's gangrene of the perirectal, scrotal, penile and abdominal regions.  Patient was initially admitted in the ICU,  he was requiring IV pressors to maintain blood pressure.  Blood pressure has improved.  He is off pressors now,  he underwent multiple incision and drainage and surgical debridements,  general surgery and urology is following.  Patient is on IV antibiotics.  He is started on midodrine for blood pressure control.  Significant hospital events. 6/25 I&D and debridement. Central line placement 6/26 Second I&D 6/28 Hypokalemic overnight, Levophed slightly increased to 59mcg 6/29 third I&D 6/30 remains on levo, great urine output. Received one unit PRBC overnight for hgb 7.1 7/1 PICC line placed and CVC removed overnight, pressor requirement decreased with addition of midodrine D/c'd levophed. 7/8: Diagnostic laparoscopy, open descending loop colostomy    Assessment & Plan:   Principal Problem:   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 Active Problems:   Perirectal abscess   Proctitis   Sepsis ([place])   Tobacco abuse   [place] speaking patient - Prefers [place] or [place]   Fourniers gangrene   Stricture of rectum   Malnutrition of moderate degree   Rectal cancer (HCC)  Septic shock secondary to Fournier's gangrene: Patient was hypotensive, tachycardic,  tachypneic with focus of infection on arrival. He was initially admitted in ICU , requiring Levophed support. General surgery was consulted,  He underwent multiple surgical debridements on 6/25, 6/26, 6/29. Continue IV antibiotics (cefepime and Flagyl). Continue adequate pain control with oxycodone. Daily wound assessment, continue wound care Continue midodrine 10 mg 3  times daily Patient is off Levophed.  Sepsis physiology is improving. Shock is resolved. Encourage oral hydration.  Adenocarcinoma of rectum. It was noted during debridement of Fournier's gangrene. GI following, recommended colonoscopy once patient is medically stable. Patient requested clinical staff not to discuss cancer diagnosis with his family members. Avoid rectal tube as per PCCM recommendation.  Patient will eventually need CT chest at some point to rule out metastatic disease. Fecal diversion is not planned at this time as the patient is not obstructed. He underwent diagnostic laparoscopy, open descending loop colostomy 12/13/20 , tolerated well. Needs oncology consult. Continue Foley catheter per urology.  Tobacco use disorder: Nicotine patch.  Normocytic normochromic anemia. Hb remained stable,  continue iron supplementation.  DVT prophylaxis: Lovenox Code Status: Full code. Family Communication: No family at bed side. Disposition Plan:   Status is: Inpatient  Remains inpatient appropriate because:Inpatient level of care appropriate due to severity of illness  Dispo: The patient is from: Home              Anticipated d/c is to: Home              Patient currently is not medically stable to d/c.   Difficult to place patient No  Consultants:  General surgery, urology, PCCM  Procedures: Multiple incision and drainage and debridements  Antimicrobials:   Anti-infectives (From admission, onward)    Start     Dose/Rate Route Frequency Ordered Stop   12/04/20 2200  ceFEPIme (MAXIPIME) 2 g in sodium chloride 0.9 % 100 mL IVPB        2 g 200 mL/hr over 30 Minutes Intravenous Every 8 hours 12/04/20 1327  12/04/20 2200  metroNIDAZOLE (FLAGYL) IVPB 500 mg        500 mg 100 mL/hr over 60 Minutes Intravenous Every 12 hours 12/04/20 1327     12/02/20 1400  linezolid (ZYVOX) IVPB 600 mg  Status:  Discontinued        600 mg 300 mL/hr over 60 Minutes Intravenous Every 12  hours 12/02/20 1138 12/04/20 1327   11/30/20 1500  vancomycin (VANCOREADY) IVPB 1250 mg/250 mL  Status:  Discontinued        1,250 mg 166.7 mL/hr over 90 Minutes Intravenous Every 12 hours 11/30/20 0606 12/02/20 1138   11/30/20 0600  piperacillin-tazobactam (ZOSYN) IVPB 3.375 g  Status:  Discontinued        3.375 g 12.5 mL/hr over 240 Minutes Intravenous Every 8 hours 11/30/20 0237 12/04/20 1327   11/30/20 0403  piperacillin-tazobactam (ZOSYN) 3.375 GM/50ML IVPB       Note to Pharmacy: Hedy Camara   : cabinet override      11/30/20 0403 12/01/20 0206   11/30/20 0230  clindamycin (CLEOCIN) IVPB 600 mg  Status:  Discontinued        600 mg 100 mL/hr over 30 Minutes Intravenous  Once 11/30/20 0219 11/30/20 0237   11/30/20 0000  ceFEPIme (MAXIPIME) 2 g in sodium chloride 0.9 % 100 mL IVPB        2 g 200 mL/hr over 30 Minutes Intravenous  Once 11/29/20 2347 11/30/20 0033   11/30/20 0000  vancomycin (VANCOREADY) IVPB 1500 mg/300 mL        1,500 mg 150 mL/hr over 120 Minutes Intravenous  Once 11/29/20 2347 11/30/20 0300   11/29/20 2345  vancomycin (VANCOCIN) IVPB 1000 mg/200 mL premix  Status:  Discontinued        1,000 mg 200 mL/hr over 60 Minutes Intravenous  Once 11/29/20 2340 11/29/20 2347   11/29/20 2345  ceFEPIme (MAXIPIME) 1 g in sodium chloride 0.9 % 100 mL IVPB  Status:  Discontinued        1 g 200 mL/hr over 30 Minutes Intravenous  Once 11/29/20 2340 11/29/20 2347        Subjective: Patient was seen and examined at bedside.  Overnight events noted.  He underwent diagnostic laparoscopy, open descending loop colostomy, tolerated well. Blood pressure has improved.  Wounds are healing better.   Objective: Vitals:   12/13/20 1314 12/13/20 1330 12/13/20 1338 12/13/20 1409  BP:  120/77  104/71  Pulse: 86 89 91 93  Resp: 13 17 20 18   Temp:   (!) 97.5 F (36.4 C) 98 F (36.7 C)  TempSrc:    Oral  SpO2: 97% 98% 98% 97%  Weight:      Height:        Intake/Output Summary  (Last 24 hours) at 12/13/2020 1450 Last data filed at 12/13/2020 1341 Gross per 24 hour  Intake 1050 ml  Output 1045 ml  Net 5 ml   Filed Weights   11/30/20 0800 12/10/20 1703 12/13/20 0920  Weight: 75.1 kg 64 kg 64 kg    Examination:  General exam: Appears calm and comfortable , not in any acute distress. Respiratory system: Clear to auscultation. Respiratory effort normal. Cardiovascular system: S1 & S2 heard, RRR. No JVD, murmurs, rubs, gallops or clicks. No pedal edema. Gastrointestinal system: Abdomen is nondistended, soft and mildly tender, no organomegaly or masses felt.  Normal bowel sounds heard. Multiple wounds noted but which are covered in dressing. Central nervous system: Alert and oriented. No focal neurological  deficits. Extremities: Symmetric 5 x 5 power. Skin: No rashes, lesions or ulcers Psychiatry: Judgement and insight appear normal. Mood & affect appropriate.     Data Reviewed: I have personally reviewed following labs and imaging studies  CBC: Recent Labs  Lab 12/07/20 0500 12/08/20 0500 12/11/20 0314 12/12/20 0432 12/13/20 0416  WBC 13.1* 10.8* 13.2* 13.1* 10.8*  NEUTROABS  --   --  11.0*  --   --   HGB 8.7* 8.8* 9.5* 9.7* 9.9*  HCT 28.1* 28.4* 31.4* 32.2* 33.5*  MCV 92.7 91.6 95.2 95.5 97.1  PLT 489* 494* 513* 458* 086*   Basic Metabolic Panel: Recent Labs  Lab 12/08/20 0500 12/11/20 0314 12/12/20 0432 12/13/20 0416  NA 139 140 140 139  K 3.8 3.9 4.1 4.0  CL 106 106 108 107  CO2 26 26 26 27   GLUCOSE 163* 121* 135* 128*  BUN 11 15 17 16   CREATININE 0.54* 0.50* 0.58* 0.50*  CALCIUM 8.9 8.9 8.9 8.9  MG  --  2.6* 2.2 2.2  PHOS  --   --  3.2 3.3   GFR: Estimated Creatinine Clearance: 98.9 mL/min (A) (by C-G formula based on SCr of 0.5 mg/dL (L)). Liver Function Tests: Recent Labs  Lab 12/08/20 0500 12/12/20 0432  AST 12* 14*  ALT 23 16  ALKPHOS 58 62  BILITOT 0.4 0.6  PROT 6.5 6.2*  ALBUMIN 2.5* 2.7*   No results for input(s):  LIPASE, AMYLASE in the last 168 hours. No results for input(s): AMMONIA in the last 168 hours. Coagulation Profile: No results for input(s): INR, PROTIME in the last 168 hours. Cardiac Enzymes: No results for input(s): CKTOTAL, CKMB, CKMBINDEX, TROPONINI in the last 168 hours. BNP (last 3 results) No results for input(s): PROBNP in the last 8760 hours. HbA1C: No results for input(s): HGBA1C in the last 72 hours. CBG: Recent Labs  Lab 12/12/20 1632 12/12/20 2140 12/13/20 0738 12/13/20 0914 12/13/20 1421  GLUCAP 126* 129* 97 88 110*   Lipid Profile: No results for input(s): CHOL, HDL, LDLCALC, TRIG, CHOLHDL, LDLDIRECT in the last 72 hours. Thyroid Function Tests: No results for input(s): TSH, T4TOTAL, FREET4, T3FREE, THYROIDAB in the last 72 hours. Anemia Panel: No results for input(s): VITAMINB12, FOLATE, FERRITIN, TIBC, IRON, RETICCTPCT in the last 72 hours. Sepsis Labs: No results for input(s): PROCALCITON, LATICACIDVEN in the last 168 hours.  Recent Results (from the past 240 hour(s))  MRSA Next Gen by PCR, Nasal     Status: None   Collection Time: 12/08/20  8:38 AM   Specimen: Nasal Mucosa; Nasal Swab  Result Value Ref Range Status   MRSA by PCR Next Gen NOT DETECTED NOT DETECTED Final    Comment: (NOTE) The GeneXpert MRSA Assay (FDA approved for NASAL specimens only), is one component of a comprehensive MRSA colonization surveillance program. It is not intended to diagnose MRSA infection nor to guide or monitor treatment for MRSA infections. Test performance is not FDA approved in patients less than 22 years old. Performed at [facility], [place] [address], [place], [place] [address]     Radiology Studies: No results found.  Scheduled Meds:  (feeding supplement) PROSource Plus  30 mL Oral BID BM   amisulpride       vitamin C  500 mg Oral BID   bacitracin   Topical BID   Chlorhexidine Gluconate Cloth  6 each Topical Daily   cholestyramine  light  4 g Oral QID   enoxaparin (LOVENOX) injection  40 mg Subcutaneous Daily   feeding supplement  237 mL Oral BID BM   ferrous sulfate  325 mg Oral Q breakfast   hydrocortisone  30 mg Oral BID   insulin aspart  0-9 Units Subcutaneous TID WC   lip balm  1 application Topical BID   liver oil-zinc oxide   Topical BID   mouth rinse  15 mL Mouth Rinse BID   midodrine  10 mg Oral TID WC   multivitamin with minerals  1 tablet Oral Daily   nicotine  21 mg Transdermal Daily   nutrition supplement (JUVEN)  1 packet Oral BID BM   pantoprazole  40 mg Oral QHS   sodium chloride flush  10-40 mL Intracatheter Q12H   zinc sulfate  220 mg Oral Daily   Continuous Infusions:  sodium chloride 250 mL (12/12/20 1756)   ceFEPime (MAXIPIME) IV 2 g (12/13/20 7841)   metronidazole 500 mg (12/13/20 0805)     LOS: 13 days    Time spent: 25 mins    Kassidee Narciso, MD [facility]   If 7PM-7AM, please contact night-coverage

## 2020-12-13 NOTE — Anesthesia Procedure Notes (Signed)
Procedure Name: Intubation Date/Time: 12/13/2020 10:43 AM Performed by: Claudia Desanctis, CRNA Pre-anesthesia Checklist: Patient identified, Emergency Drugs available, Suction available and Patient being monitored Patient Re-evaluated:Patient Re-evaluated prior to induction Oxygen Delivery Method: Circle system utilized Preoxygenation: Pre-oxygenation with 100% oxygen Induction Type: IV induction Ventilation: Mask ventilation without difficulty Laryngoscope Size: 2 and Miller Grade View: Grade I Tube type: Oral Tube size: 7.5 mm Number of attempts: 1 Airway Equipment and Method: Stylet Placement Confirmation: ETT inserted through vocal cords under direct vision, positive ETCO2 and breath sounds checked- equal and bilateral Secured at: 22 cm Tube secured with: Tape Dental Injury: Teeth and Oropharynx as per pre-operative assessment

## 2020-12-13 NOTE — Plan of Care (Signed)
  Problem: Education: Goal: Knowledge of General Education information will improve Description: Including pain rating scale, medication(s)/side effects and non-pharmacologic comfort measures Outcome: Progressing   Problem: Health Behavior/Discharge Planning: Goal: Ability to manage health-related needs will improve Outcome: Progressing   Problem: Clinical Measurements: Goal: Ability to maintain clinical measurements within normal limits will improve Outcome: Progressing Goal: Will remain free from infection Outcome: Progressing Goal: Diagnostic test results will improve Outcome: Progressing Goal: Respiratory complications will improve Outcome: Progressing Goal: Cardiovascular complication will be avoided Outcome: Progressing   Problem: Activity: Goal: Risk for activity intolerance will decrease Outcome: Progressing   Problem: Nutrition: Goal: Adequate nutrition will be maintained Outcome: Progressing   Problem: Coping: Goal: Level of anxiety will decrease Outcome: Progressing   Problem: Elimination: Goal: Will not experience complications related to bowel motility Outcome: Progressing Goal: Will not experience complications related to urinary retention Outcome: Progressing   Problem: Pain Managment: Goal: General experience of comfort will improve Outcome: Progressing   Problem: Safety: Goal: Ability to remain free from injury will improve Outcome: Progressing   Problem: Skin Integrity: Goal: Risk for impaired skin integrity will decrease Outcome: Progressing   Problem: Clinical Measurements: Goal: Ability to maintain clinical measurements within normal limits will improve Outcome: Progressing Goal: Postoperative complications will be avoided or minimized Outcome: Progressing   Problem: Skin Integrity: Goal: Demonstration of wound healing without infection will improve Outcome: Progressing   

## 2020-12-13 NOTE — Op Note (Signed)
Operative Note  Pre-operative Diagnosis:  obstructing rectal carcinoma  Post-operative Diagnosis:  same  Surgeon:  Armandina Gemma, MD  Assistant:  none   Procedure:  Diagnostic laparoscopy, open descending loop colostomy  Anesthesia:  general  Estimated Blood Loss:  minimal   Drains: none         Specimen: none  Indications: Patient is a 50 year old male with obstructing rectal carcinoma.  He now comes to surgery for diverting loop colostomy.  Procedure:  The patient was seen in the pre-op holding area. The risks, benefits, complications, treatment options, and expected outcomes were previously discussed with the patient. The patient agreed with the proposed plan and has signed the informed consent form.  The patient was brought to the operating room by the surgical team, identified as James Coffey and the procedure verified. A "time out" was completed and the above information confirmed.  Following induction of general anesthesia, the patient was positioned and then prepped and draped in the usual aseptic fashion.  After ascertaining that an adequate level of anesthesia been achieved, an incision is made in the midclavicular line at the left costal margin.  Using a 5 mm Optiview trocar the peritoneal cavity was accessed and pneumoperitoneum was established.  Scope was introduced into the peritoneal cavity.  A second 5 mm trocar was placed in the right upper quadrant.  Small bowel was moved to the right.  The descending colon and sigmoid colon were visualized.  Unfortunately the peritoneal attachments were quite tight to the colon and there was limited mobility.  After attempts to mobilize the colon were unsuccessful, the decision was made to convert to open surgery.  Trochars were removed.  Using a #15 blade, a midline incision is made.  Dissection was carried through subcutaneous tissues with the electrocautery.  Fascia was incised in the midline and the peritoneal cavity was entered  cautiously.  Using the electrocautery the lateral peritoneal attachments of the proximal sigmoid and descending colon were divided allowing for mobilization of a normal-appearing descending colon.  A point in the mid descending colon was selected and a 16 French red rubber Robinson catheter was passed through the antimesenteric border.  An elliptical incision was made on the abdominal wall at the site previously marked by the wound ostomy care nurses.  A plug of adipose tissue was removed down to the fascia.  Fascia was incised in a cruciate fashion and the muscle was split in line of its fibers.  Peritoneal cavity was then entered cautiously.  Red rubber Robinson catheter was delivered through the wound and the loop of mid descending colon was easily brought up to above skin level.  The colon was secured to the anterior fascia with interrupted 2-0 silk sutures.  The red rubber Robinson catheter was sutured to itself to create a loop.  Next the midline incision was closed with running #1 PDS suture.  Subcutaneous tissues were irrigated and the skin was closed with stainless steel staples.  Both of the previous trocar sites were closed with stainless steel staples.  The colostomy was then matured by incising along the tinea at the antimesenteric border with the electrocautery.  The edges of the colon are then secured to the skin edges with interrupted 3-0 Vicryl suture circumferentially.  The red rubber Robinson loop was left in position.  Open wounds from prior surgical procedure were then dressed with saline moistened gauze sponges and covered with dry gauze dressings.  Colostomy appliance was applied at the loop colostomy site.  Midline wound was dressed with dry gauze.  Patient was awakened from anesthesia and transported to the recovery room in stable condition.  The patient tolerated the procedure well.   Armandina Gemma, MD [facility] Office: [phone]

## 2020-12-13 NOTE — Interval H&P Note (Signed)
History and Physical Interval Note:  12/13/2020 10:22 AM  James Coffey  has presented today for surgery, with the diagnosis of rectal cancer.  The various methods of treatment have been discussed with the patient and family. After consideration of risks, benefits and other options for treatment, the patient has consented to    Procedure(s): LAPAROSCOPIC vs OPEN LOOP COLOSTOMY (N/A) as a surgical intervention.    The patient's history has been reviewed, patient examined, no change in status, stable for surgery.  I have reviewed the patient's chart and labs.  Questions were answered to the patient's satisfaction.    Armandina Gemma, MD [facility] Office: [place]

## 2020-12-14 ENCOUNTER — Inpatient Hospital Stay (HOSPITAL_COMMUNITY): Payer: Self-pay

## 2020-12-14 ENCOUNTER — Encounter (HOSPITAL_COMMUNITY): Payer: Self-pay | Admitting: Surgery

## 2020-12-14 LAB — COMPREHENSIVE METABOLIC PANEL
ALT: 16 U/L (ref 0–44)
AST: 15 U/L (ref 15–41)
Albumin: 2.8 g/dL — ABNORMAL LOW (ref 3.5–5.0)
Alkaline Phosphatase: 59 U/L (ref 38–126)
Anion gap: 6 (ref 5–15)
BUN: 15 mg/dL (ref 6–20)
CO2: 30 mmol/L (ref 22–32)
Calcium: 9.1 mg/dL (ref 8.9–10.3)
Chloride: 103 mmol/L (ref 98–111)
Creatinine, Ser: 0.58 mg/dL — ABNORMAL LOW (ref 0.61–1.24)
GFR, Estimated: >60 mL/min (ref >60)
Glucose, Bld: 130 mg/dL — ABNORMAL HIGH (ref 70–99)
Potassium: 3.8 mmol/L (ref 3.5–5.1)
Sodium: 139 mmol/L (ref 135–145)
Total Bilirubin: 0.8 mg/dL (ref 0.3–1.2)
Total Protein: 6.5 g/dL (ref 6.5–8.1)

## 2020-12-14 LAB — CBC
HCT: 33.5 % — ABNORMAL LOW (ref 39.0–52.0)
Hemoglobin: 10.3 g/dL — ABNORMAL LOW (ref 13.0–17.0)
MCH: 28.9 pg (ref 26.0–34.0)
MCHC: 30.7 g/dL (ref 30.0–36.0)
MCV: 94.1 fL (ref 80.0–100.0)
Platelets: 488 10*3/uL — ABNORMAL HIGH (ref 150–400)
RBC: 3.56 MIL/uL — ABNORMAL LOW (ref 4.22–5.81)
RDW: 18.1 % — ABNORMAL HIGH (ref 11.5–15.5)
WBC: 12.9 10*3/uL — ABNORMAL HIGH (ref 4.0–10.5)
nRBC: 0 % (ref 0.0–0.2)

## 2020-12-14 LAB — GLUCOSE, CAPILLARY
Glucose-Capillary: 128 mg/dL — ABNORMAL HIGH (ref 70–99)
Glucose-Capillary: 120 mg/dL — ABNORMAL HIGH (ref 70–99)
Glucose-Capillary: 117 mg/dL — ABNORMAL HIGH (ref 70–99)
Glucose-Capillary: 108 mg/dL — ABNORMAL HIGH (ref 70–99)

## 2020-12-14 LAB — MAGNESIUM: Magnesium: 2.2 mg/dL (ref 1.7–2.4)

## 2020-12-14 LAB — PHOSPHORUS: Phosphorus: 3.8 mg/dL (ref 2.5–4.6)

## 2020-12-14 MED ORDER — HYDROCORTISONE 20 MG PO TABS
20.0000 mg | ORAL_TABLET | Freq: Every day | ORAL | Status: AC
  Administered 2020-12-17 – 2020-12-19 (×3): 20 mg via ORAL
  Filled 2020-12-14 (×3): qty 1

## 2020-12-14 MED ORDER — IOHEXOL 300 MG/ML  SOLN
75.0000 mL | Freq: Once | INTRAMUSCULAR | Status: AC | PRN
  Administered 2020-12-14: 75 mL via INTRAVENOUS

## 2020-12-14 MED ORDER — ACETAMINOPHEN 500 MG PO TABS
1000.0000 mg | ORAL_TABLET | Freq: Four times a day (QID) | ORAL | Status: DC
  Administered 2020-12-14 – 2020-12-19 (×21): 1000 mg via ORAL
  Filled 2020-12-14 (×21): qty 2

## 2020-12-14 MED ORDER — SODIUM CHLORIDE (PF) 0.9 % IJ SOLN
INTRAMUSCULAR | Status: AC
  Administered 2020-12-14: 10 mL
  Filled 2020-12-14: qty 50

## 2020-12-14 MED ORDER — HYDROCORTISONE 20 MG PO TABS
30.0000 mg | ORAL_TABLET | Freq: Every day | ORAL | Status: AC
  Administered 2020-12-15 – 2020-12-16 (×2): 30 mg via ORAL
  Filled 2020-12-14 (×2): qty 1

## 2020-12-14 MED ORDER — HYDROCORTISONE 5 MG PO TABS: 5.0000 mg | ORAL_TABLET | Freq: Every day | ORAL | Status: DC

## 2020-12-14 MED ORDER — HYDROCORTISONE 10 MG PO TABS: 10.0000 mg | ORAL_TABLET | Freq: Every day | ORAL | Status: DC

## 2020-12-14 NOTE — Plan of Care (Signed)
  Problem: Coping: Goal: Level of anxiety will decrease Outcome: Progressing   Problem: Pain Managment: Goal: General experience of comfort will improve Outcome: Progressing   

## 2020-12-14 NOTE — Progress Notes (Signed)
All dressing changes completed per order. Small horizontal incisions to lower left and lower right abdomen cleaned with nss, bacitracin ointment applied, covered with dry gauze, and taped. Infra-umbilical incision- packing was removed, wound cleaned with nss, packed with saline soaked gauze, covered with dry gauze, and taped. Midline vertical stapled incision line cleaned with nss, covered with dry gauze, covered with abd pad, and taped. There are 2 smaller sites in the upper left abdomen and mid right abdomen both closed with 2 staples- these were cleaned with nss and covered with dry gauze and taped. Colostomy appliance intact- minimal liquid brown stool present, stoma red and flush with skin. Incision site to peri-anal area cleaned with nss, packed with saline soaked gauze, covered with dry gauze, and covered with abd pad- no tape was used as patients buttocks and perineum are extremely excoriated with a red rash and also a small area on each inner buttock where the skin sloughed off- all of these areas were covered with a generous layer of desitin. Patient was medicated before and after dressing changes. Tolerated well. Will monitor.

## 2020-12-14 NOTE — Progress Notes (Signed)
Progress Note  1 Day Post-Op     Subjective:   Objective: Vital signs in last 24 hours: Temp:  [98 F (36.7 C)-98.7 F (37.1 C)] 98 F (36.7 C) (07/09 1207) Pulse Rate:  [65-93] 75 (07/09 1207) Resp:  [18-20] 18 (07/09 1207) BP: (100-108)/(68-74) 108/69 (07/09 1207) SpO2:  [95 %-100 %] 95 % (07/09 1207) Last BM Date: 12/14/20 (small amount of liquid brown stool in colostomy pouch)  Intake/Output from previous day: 07/08 0701 - 07/09 0700 In: 1290 [P.O.:240; I.V.:1050] Out: 1595 [Urine:1575; Blood:20] Intake/Output this shift: Total I/O In: 240 [P.O.:240] Out: -   PE: Gen: Awake and alert, pleasant Lungs: Normal rate and effort Heart: RRR Abd: soft, NT, ND, suprapubic and infraumbilical wounds stable, lateral wounds shallow GU: foley present   Lab Results:  Recent Labs    12/13/20 0416 12/14/20 0304  WBC 10.8* 12.9*  HGB 9.9* 10.3*  HCT 33.5* 33.5*  PLT 478* 488*   BMET Recent Labs    12/13/20 0416 12/14/20 0304  NA 139 139  K 4.0 3.8  CL 107 103  CO2 27 30  GLUCOSE 128* 130*  BUN 16 15  CREATININE 0.50* 0.58*  CALCIUM 8.9 9.1   PT/INR No results for input(s): LABPROT, INR in the last 72 hours. CMP     Component Value Date/Time   NA 139 12/14/2020 0304   K 3.8 12/14/2020 0304   CL 103 12/14/2020 0304   CO2 30 12/14/2020 0304   GLUCOSE 130 (H) 12/14/2020 0304   BUN 15 12/14/2020 0304   CREATININE 0.58 (L) 12/14/2020 0304   CALCIUM 9.1 12/14/2020 0304   PROT 6.5 12/14/2020 0304   ALBUMIN 2.8 (L) 12/14/2020 0304   AST 15 12/14/2020 0304   ALT 16 12/14/2020 0304   ALKPHOS 59 12/14/2020 0304   BILITOT 0.8 12/14/2020 0304   GFRNONAA >60 12/14/2020 0304   Lipase     Component Value Date/Time   LIPASE 22 11/03/2020 1714       Studies/Results: No results found.  Anti-infectives: Anti-infectives (From admission, onward)    Start     Dose/Rate Route Frequency Ordered Stop   12/04/20 2200  ceFEPIme (MAXIPIME) 2 g in sodium  chloride 0.9 % 100 mL IVPB        2 g 200 mL/hr over 30 Minutes Intravenous Every 8 hours 12/04/20 1327     12/04/20 2200  metroNIDAZOLE (FLAGYL) IVPB 500 mg        500 mg 100 mL/hr over 60 Minutes Intravenous Every 12 hours 12/04/20 1327     12/02/20 1400  linezolid (ZYVOX) IVPB 600 mg  Status:  Discontinued        600 mg 300 mL/hr over 60 Minutes Intravenous Every 12 hours 12/02/20 1138 12/04/20 1327   11/30/20 1500  vancomycin (VANCOREADY) IVPB 1250 mg/250 mL  Status:  Discontinued        1,250 mg 166.7 mL/hr over 90 Minutes Intravenous Every 12 hours 11/30/20 0606 12/02/20 1138   11/30/20 0600  piperacillin-tazobactam (ZOSYN) IVPB 3.375 g  Status:  Discontinued        3.375 g 12.5 mL/hr over 240 Minutes Intravenous Every 8 hours 11/30/20 0237 12/04/20 1327   11/30/20 0403  piperacillin-tazobactam (ZOSYN) 3.375 GM/50ML IVPB       Note to Pharmacy: Hedy Camara   : cabinet override      11/30/20 0403 12/01/20 0206   11/30/20 0230  clindamycin (CLEOCIN) IVPB 600 mg  Status:  Discontinued  600 mg 100 mL/hr over 30 Minutes Intravenous  Once 11/30/20 0219 11/30/20 0237   11/30/20 0000  ceFEPIme (MAXIPIME) 2 g in sodium chloride 0.9 % 100 mL IVPB        2 g 200 mL/hr over 30 Minutes Intravenous  Once 11/29/20 2347 11/30/20 0033   11/30/20 0000  vancomycin (VANCOREADY) IVPB 1500 mg/300 mL        1,500 mg 150 mL/hr over 120 Minutes Intravenous  Once 11/29/20 2347 11/30/20 0300   11/29/20 2345  vancomycin (VANCOCIN) IVPB 1000 mg/200 mL premix  Status:  Discontinued        1,000 mg 200 mL/hr over 60 Minutes Intravenous  Once 11/29/20 2340 11/29/20 2347   11/29/20 2345  ceFEPIme (MAXIPIME) 1 g in sodium chloride 0.9 % 100 mL IVPB  Status:  Discontinued        1 g 200 mL/hr over 30 Minutes Intravenous  Once 11/29/20 2340 11/29/20 2347        Assessment/Plan POD13 s/p EUA, rectal wall bx, I&D of perirectal, scrotal, penile and abdominal regions for Fournier's gangrene - Dr. Johney Maine  w/ Dr. Alinda Money - 11/30/20 POD12 s/p I&D of perirectal, scrotal, penile and abdominal regions for Fournier's gangrene, rectal stricturing/possible cancer - Dr. Johney Maine and Dr. Alinda Money - 12/01/20 POD9 s/p EUA w/ I&D for Fournier's gangrene - Dr. Hassell Done and Dr. Alinda Money - 12/04/20 - Wounds clean. No further debridement indicated at this time POD1 s/p lap converted to open diverting loop colostomy Dr. Harlow Asa 12/13/2020   -Continue packing wounds / dressing changes.  Now that he is diverted, can transition to just daily in order to endorse micro debridement of the accumulating fibrinous exudate - [place] for periwound fungal infection around right perineum incision . Avoid tape (use mesh undergarment). Keep periwound clean and dry - E. Coli, Enterobacter cloacae, Bacteroides Ovatus, Beta Lactamase + -he has had 2 weeks of IV antibiotics and over 7 weeks since his last surgical debridement.  Okay to stop antibiotics and follow - Foley per urology - Appreciate CCM/TRH assistance   Adenocarcinoma of the rectum Rectal Stricturing - Noted from bx of rectal wall - GI consulted for colonoscopy - recommending waiting until perineal wounds have healed - plan fecal diversion with loop colostomy.     - CEA 16.7 - Oncology consult - Has had CT A/P during admission. Needs CT chest before discharge - I ordered 7/9   FEN - Reg, NPO after MN VTE - SCDs, Lovenox ID - Cefepime/Flagyl - stop since >7d from last debridement Foley - in place   Shock - resolved.  No need for tele from surgery standpoint Tobacco use ABL anemia w/ + FOBT - hgb stable at 8.8 on 7/3. On iron.   LOS: 14 days    Adin Hector, [facility] 12/14/2020, 1:38 PM Please see Amion for pager number during day hours 7:00am-4:30pm

## 2020-12-14 NOTE — Progress Notes (Signed)
PROGRESS NOTE    James Coffey  IRW:[id number] DOB: Dec 17, 1970 DOA: 11/29/2020 PCP: Pcp, No  Brief Narrative:  This 50 years old male with PMH significant for tobacco abuse, history of rectal cancer presented in the ED with septic shock secondary to Fournier's gangrene of the perirectal, scrotal, penile and abdominal regions.  Patient was initially admitted in the ICU,  he was requiring IV pressors to maintain blood pressure.  Blood pressure has improved.  He is off pressors now,  he underwent multiple incision and drainage and surgical debridements,  general surgery and urology is following.  Patient is on IV antibiotics.  He is started on midodrine for blood pressure control.  Significant hospital events. 6/25 I&D and debridement. Central line placement 6/26 Second I&D 6/28 Hypokalemic overnight, Levophed slightly increased to 40mcg 6/29 third I&D 6/30 remains on levo, great urine output. Received one unit PRBC overnight for hgb 7.1 7/1 PICC line placed and CVC removed overnight, pressor requirement decreased with addition of midodrine D/c'd levophed. 7/8: Diagnostic laparoscopy, open descending loop colostomy    Assessment & Plan:   Principal Problem:   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 Active Problems:   Perirectal abscess   Proctitis   Sepsis ([place])   Tobacco abuse   [place] speaking patient - Prefers [place] or [place]   Fourniers gangrene   Stricture of rectum   Malnutrition of moderate degree   Rectal cancer (HCC)  Septic shock secondary to Fournier's gangrene: Patient was hypotensive, tachycardic,  tachypneic with focus of infection on arrival. He was initially admitted in ICU , requiring Levophed support. General surgery was consulted,  He underwent multiple surgical debridements on 6/25, 6/26, 6/29. Continue IV antibiotics (cefepime and Flagyl). Continue adequate pain control with oxycodone. Daily wound assessment, continue wound care Culture grow: E. Coli,  Enterobacter cloacae, Bacteroides Ovatus, Beta Lactamase + Continue midodrine 10 mg 3 times daily Patient is off Levophed.  Sepsis physiology is improving. Shock is resolved. Encourage oral hydration.  Adenocarcinoma of rectum. It was noted during debridement of Fournier's gangrene. GI following, recommended colonoscopy once patient is medically stable. Patient requested clinical staff not to discuss cancer diagnosis with his family members. Avoid rectal tube as per PCCM recommendation.  Patient will eventually need CT chest at some point to rule out metastatic disease. He underwent diagnostic laparoscopy, open descending loop colostomy 12/13/20 , tolerated well. Needs oncology consult. Continue Foley catheter per urology.  Tobacco use disorder: Nicotine patch.  Normocytic normochromic anemia. Hb remained stable,  continue iron supplementation.  DVT prophylaxis: Lovenox Code Status: Full code. Family Communication: No family at bed side. Disposition Plan:   Status is: Inpatient  Remains inpatient appropriate because:Inpatient level of care appropriate due to severity of illness  Dispo: The patient is from: Home              Anticipated d/c is to: Home              Patient currently is not medically stable to d/c.   Difficult to place patient No  Consultants:  General surgery, urology, PCCM  Procedures: Multiple incision and drainage and debridements  Antimicrobials:   Anti-infectives (From admission, onward)    Start     Dose/Rate Route Frequency Ordered Stop   12/04/20 2200  ceFEPIme (MAXIPIME) 2 g in sodium chloride 0.9 % 100 mL IVPB        2 g 200 mL/hr over 30 Minutes Intravenous Every 8 hours 12/04/20 1327     12/04/20 2200  metroNIDAZOLE (FLAGYL) IVPB 500 mg        500 mg 100 mL/hr over 60 Minutes Intravenous Every 12 hours 12/04/20 1327     12/02/20 1400  linezolid (ZYVOX) IVPB 600 mg  Status:  Discontinued        600 mg 300 mL/hr over 60 Minutes Intravenous  Every 12 hours 12/02/20 1138 12/04/20 1327   11/30/20 1500  vancomycin (VANCOREADY) IVPB 1250 mg/250 mL  Status:  Discontinued        1,250 mg 166.7 mL/hr over 90 Minutes Intravenous Every 12 hours 11/30/20 0606 12/02/20 1138   11/30/20 0600  piperacillin-tazobactam (ZOSYN) IVPB 3.375 g  Status:  Discontinued        3.375 g 12.5 mL/hr over 240 Minutes Intravenous Every 8 hours 11/30/20 0237 12/04/20 1327   11/30/20 0403  piperacillin-tazobactam (ZOSYN) 3.375 GM/50ML IVPB       Note to Pharmacy: Hedy Camara   : cabinet override      11/30/20 0403 12/01/20 0206   11/30/20 0230  clindamycin (CLEOCIN) IVPB 600 mg  Status:  Discontinued        600 mg 100 mL/hr over 30 Minutes Intravenous  Once 11/30/20 0219 11/30/20 0237   11/30/20 0000  ceFEPIme (MAXIPIME) 2 g in sodium chloride 0.9 % 100 mL IVPB        2 g 200 mL/hr over 30 Minutes Intravenous  Once 11/29/20 2347 11/30/20 0033   11/30/20 0000  vancomycin (VANCOREADY) IVPB 1500 mg/300 mL        1,500 mg 150 mL/hr over 120 Minutes Intravenous  Once 11/29/20 2347 11/30/20 0300   11/29/20 2345  vancomycin (VANCOCIN) IVPB 1000 mg/200 mL premix  Status:  Discontinued        1,000 mg 200 mL/hr over 60 Minutes Intravenous  Once 11/29/20 2340 11/29/20 2347   11/29/20 2345  ceFEPIme (MAXIPIME) 1 g in sodium chloride 0.9 % 100 mL IVPB  Status:  Discontinued        1 g 200 mL/hr over 30 Minutes Intravenous  Once 11/29/20 2340 11/29/20 2347        Subjective: Patient was seen and examined at bedside.  Overnight events noted.  He underwent diagnostic laparoscopy, open descending loop colostomy 7/8, tolerated well. Blood pressure has improved.  Wounds are healing better. Brownish stool noted in colostomy bag.   Objective: Vitals:   12/13/20 2120 12/14/20 0201 12/14/20 0502 12/14/20 0942  BP: 107/68 103/73 107/73 107/70  Pulse: 67 65 72 78  Resp: 20 20 20 18   Temp: 98.2 F (36.8 C) 98.7 F (37.1 C) 98.2 F (36.8 C) 98.3 F (36.8 C)   TempSrc:      SpO2: 100% 96% 97% 97%  Weight:      Height:        Intake/Output Summary (Last 24 hours) at 12/14/2020 1114 Last data filed at 12/14/2020 0900 Gross per 24 hour  Intake 1410 ml  Output 1595 ml  Net -185 ml   Filed Weights   11/30/20 0800 12/10/20 1703 12/13/20 0920  Weight: 75.1 kg 64 kg 64 kg    Examination:  General exam: Appears calm and comfortable , not in any acute distress. Respiratory system: Clear to auscultation. Respiratory effort normal. Cardiovascular system: S1 & S2 heard, RRR. No JVD, murmurs, rubs, gallops or clicks. No pedal edema. Gastrointestinal system: Abdomen is nondistended, soft and mildly tender, no organomegaly or masses felt.  Normal bowel sounds heard. Colostomy bag , brownish stools noted.midline surgical scar. Multiple  wounds noted but which are covered in dressing. Central nervous system: Alert and oriented. No focal neurological deficits. Extremities: Symmetric 5 x 5 power. Skin: No rashes, lesions or ulcers Psychiatry: Judgement and insight appear normal. Mood & affect appropriate.     Data Reviewed: I have personally reviewed following labs and imaging studies  CBC: Recent Labs  Lab 12/08/20 0500 12/11/20 0314 12/12/20 0432 12/13/20 0416 12/14/20 0304  WBC 10.8* 13.2* 13.1* 10.8* 12.9*  NEUTROABS  --  11.0*  --   --   --   HGB 8.8* 9.5* 9.7* 9.9* 10.3*  HCT 28.4* 31.4* 32.2* 33.5* 33.5*  MCV 91.6 95.2 95.5 97.1 94.1  PLT 494* 513* 458* 478* 818*   Basic Metabolic Panel: Recent Labs  Lab 12/08/20 0500 12/11/20 0314 12/12/20 0432 12/13/20 0416 12/14/20 0304  NA 139 140 140 139 139  K 3.8 3.9 4.1 4.0 3.8  CL 106 106 108 107 103  CO2 26 26 26 27 30   GLUCOSE 163* 121* 135* 128* 130*  BUN 11 15 17 16 15   CREATININE 0.54* 0.50* 0.58* 0.50* 0.58*  CALCIUM 8.9 8.9 8.9 8.9 9.1  MG  --  2.6* 2.2 2.2 2.2  PHOS  --   --  3.2 3.3 3.8   GFR: Estimated Creatinine Clearance: 98.9 mL/min (A) (by C-G formula based on  SCr of 0.58 mg/dL (L)). Liver Function Tests: Recent Labs  Lab 12/08/20 0500 12/12/20 0432 12/14/20 0304  AST 12* 14* 15  ALT 23 16 16   ALKPHOS 58 62 59  BILITOT 0.4 0.6 0.8  PROT 6.5 6.2* 6.5  ALBUMIN 2.5* 2.7* 2.8*   No results for input(s): LIPASE, AMYLASE in the last 168 hours. No results for input(s): AMMONIA in the last 168 hours. Coagulation Profile: No results for input(s): INR, PROTIME in the last 168 hours. Cardiac Enzymes: No results for input(s): CKTOTAL, CKMB, CKMBINDEX, TROPONINI in the last 168 hours. BNP (last 3 results) No results for input(s): PROBNP in the last 8760 hours. HbA1C: No results for input(s): HGBA1C in the last 72 hours. CBG: Recent Labs  Lab 12/13/20 0914 12/13/20 1421 12/13/20 1709 12/13/20 2113 12/14/20 0724  GLUCAP 88 110* 141* 135* 128*   Lipid Profile: No results for input(s): CHOL, HDL, LDLCALC, TRIG, CHOLHDL, LDLDIRECT in the last 72 hours. Thyroid Function Tests: No results for input(s): TSH, T4TOTAL, FREET4, T3FREE, THYROIDAB in the last 72 hours. Anemia Panel: No results for input(s): VITAMINB12, FOLATE, FERRITIN, TIBC, IRON, RETICCTPCT in the last 72 hours. Sepsis Labs: No results for input(s): PROCALCITON, LATICACIDVEN in the last 168 hours.  Recent Results (from the past 240 hour(s))  MRSA Next Gen by PCR, Nasal     Status: None   Collection Time: 12/08/20  8:38 AM   Specimen: Nasal Mucosa; Nasal Swab  Result Value Ref Range Status   MRSA by PCR Next Gen NOT DETECTED NOT DETECTED Final    Comment: (NOTE) The GeneXpert MRSA Assay (FDA approved for NASAL specimens only), is one component of a comprehensive MRSA colonization surveillance program. It is not intended to diagnose MRSA infection nor to guide or monitor treatment for MRSA infections. Test performance is not FDA approved in patients less than 58 years old. Performed at [facility], [place] [address], [place], [place] [address]      Radiology Studies: No results found.  Scheduled Meds:  (feeding supplement) PROSource Plus  30 mL Oral BID BM   vitamin C  500 mg Oral BID  bacitracin   Topical BID   Chlorhexidine Gluconate Cloth  6 each Topical Daily   cholestyramine light  4 g Oral QID   enoxaparin (LOVENOX) injection  40 mg Subcutaneous Daily   feeding supplement  237 mL Oral BID BM   ferrous sulfate  325 mg Oral Q breakfast   hydrocortisone  30 mg Oral BID   insulin aspart  0-9 Units Subcutaneous TID WC   lip balm  1 application Topical BID   liver oil-zinc oxide   Topical BID   mouth rinse  15 mL Mouth Rinse BID   midodrine  10 mg Oral TID WC   multivitamin with minerals  1 tablet Oral Daily   nicotine  21 mg Transdermal Daily   nutrition supplement (JUVEN)  1 packet Oral BID BM   pantoprazole  40 mg Oral QHS   sodium chloride flush  10-40 mL Intracatheter Q12H   zinc sulfate  220 mg Oral Daily   Continuous Infusions:  sodium chloride 250 mL (12/12/20 1756)   ceFEPime (MAXIPIME) IV 2 g (12/14/20 0505)   metronidazole 500 mg (12/14/20 0857)     LOS: 14 days    Time spent: 25 mins    James Seawood, MD [facility]   If 7PM-7AM, please contact night-coverage

## 2020-12-15 LAB — CBC
HCT: 35.6 % — ABNORMAL LOW (ref 39.0–52.0)
Hemoglobin: 10.8 g/dL — ABNORMAL LOW (ref 13.0–17.0)
MCH: 28.9 pg (ref 26.0–34.0)
MCHC: 30.3 g/dL (ref 30.0–36.0)
MCV: 95.2 fL (ref 80.0–100.0)
Platelets: 445 10*3/uL — ABNORMAL HIGH (ref 150–400)
RBC: 3.74 MIL/uL — ABNORMAL LOW (ref 4.22–5.81)
RDW: 18.1 % — ABNORMAL HIGH (ref 11.5–15.5)
WBC: 10.9 10*3/uL — ABNORMAL HIGH (ref 4.0–10.5)
nRBC: 0 % (ref 0.0–0.2)

## 2020-12-15 LAB — GLUCOSE, CAPILLARY
Glucose-Capillary: 113 mg/dL — ABNORMAL HIGH (ref 70–99)
Glucose-Capillary: 138 mg/dL — ABNORMAL HIGH (ref 70–99)
Glucose-Capillary: 116 mg/dL — ABNORMAL HIGH (ref 70–99)
Glucose-Capillary: 99 mg/dL (ref 70–99)

## 2020-12-15 LAB — BASIC METABOLIC PANEL
Anion gap: 7 (ref 5–15)
BUN: 13 mg/dL (ref 6–20)
CO2: 28 mmol/L (ref 22–32)
Calcium: 8.8 mg/dL — ABNORMAL LOW (ref 8.9–10.3)
Chloride: 105 mmol/L (ref 98–111)
Creatinine, Ser: 0.46 mg/dL — ABNORMAL LOW (ref 0.61–1.24)
GFR, Estimated: >60 mL/min (ref >60)
Glucose, Bld: 103 mg/dL — ABNORMAL HIGH (ref 70–99)
Potassium: 3.4 mmol/L — ABNORMAL LOW (ref 3.5–5.1)
Sodium: 140 mmol/L (ref 135–145)

## 2020-12-15 LAB — PREALBUMIN: Prealbumin: 23.6 mg/dL (ref 18–38)

## 2020-12-15 MED ORDER — POTASSIUM CHLORIDE 20 MEQ PO PACK
40.0000 meq | PACK | Freq: Once | ORAL | Status: AC
  Administered 2020-12-15: 40 meq via ORAL
  Filled 2020-12-15: qty 2

## 2020-12-15 NOTE — Progress Notes (Signed)
Progress Note  2 Days Post-Op     Subjective: No acute changes. Reports pain with dressing changes. Colostomy functioning. CT chest with no evidence of metastatic disease.  Objective: Vital signs in last 24 hours: Temp:  [98 F (36.7 C)-98.6 F (37 C)] 98.4 F (36.9 C) (07/10 0624) Pulse Rate:  [70-88] 88 (07/10 0624) Resp:  [18-20] 20 (07/10 0624) BP: (107-121)/(69-81) 121/81 (07/10 0624) SpO2:  [94 %-97 %] 94 % (07/10 0624) Last BM Date: 12/14/20  Intake/Output from previous day: 07/09 0701 - 07/10 0700 In: 240 [P.O.:240] Out: 1300 [Urine:1300] Intake/Output this shift: No intake/output data recorded.  PE: Gen: Awake and alert, pleasant Lungs: Normal rate and effort Heart: RRR Abd: soft, NT, ND, suprapubic and infraumbilical wounds stable, lateral wounds shallow. LLQ colostomy pink and productive of stool and gas.   Lab Results:  Recent Labs    12/14/20 0304 12/15/20 0350  WBC 12.9* 10.9*  HGB 10.3* 10.8*  HCT 33.5* 35.6*  PLT 488* 445*   BMET Recent Labs    12/14/20 0304 12/15/20 0350  NA 139 140  K 3.8 3.4*  CL 103 105  CO2 30 28  GLUCOSE 130* 103*  BUN 15 13  CREATININE 0.58* 0.46*  CALCIUM 9.1 8.8*   PT/INR No results for input(s): LABPROT, INR in the last 72 hours. CMP     Component Value Date/Time   NA 140 12/15/2020 0350   K 3.4 (L) 12/15/2020 0350   CL 105 12/15/2020 0350   CO2 28 12/15/2020 0350   GLUCOSE 103 (H) 12/15/2020 0350   BUN 13 12/15/2020 0350   CREATININE 0.46 (L) 12/15/2020 0350   CALCIUM 8.8 (L) 12/15/2020 0350   PROT 6.5 12/14/2020 0304   ALBUMIN 2.8 (L) 12/14/2020 0304   AST 15 12/14/2020 0304   ALT 16 12/14/2020 0304   ALKPHOS 59 12/14/2020 0304   BILITOT 0.8 12/14/2020 0304   GFRNONAA >60 12/15/2020 0350   Lipase     Component Value Date/Time   LIPASE 22 11/03/2020 1714       Studies/Results: CT CHEST W CONTRAST  Result Date: 12/14/2020 CLINICAL DATA:  50 year old male with colorectal cancer  staging. EXAM: CT CHEST WITH CONTRAST TECHNIQUE: Multidetector CT imaging of the chest was performed during intravenous contrast administration. CONTRAST:  29mL OMNIPAQUE IOHEXOL 300 MG/ML  SOLN COMPARISON:  Chest radiograph dated 11/30/2020. CT abdomen pelvis dated 11/30/2020. FINDINGS: Evaluation of this exam is limited due to respiratory motion artifact. Cardiovascular: Borderline cardiomegaly. Three vessel coronary vascular calcification. No pericardial effusion. Mild atherosclerotic calcification of the thoracic aorta. No aneurysmal dilatation or dissection. The origins of the great vessels of the aortic arch appear patent as visualized. The central pulmonary arteries are grossly unremarkable for the degree of opacification. Right-sided PICC with tip in the right atrium. Mediastinum/Nodes: No hilar or mediastinal adenopathy. The esophagus and thyroid gland are grossly unremarkable. No mediastinal fluid collection. Lungs/Pleura: Small bilateral pleural effusions with associated partial compressive atelectasis of the lower lobes. Pneumonia is not excluded clinical correlation is recommended. Mild paraseptal emphysema. There is no pneumothorax. Mucus secretions noted in the right mainstem bronchus. The airways remain patent. Upper Abdomen: Partially visualized pneumoperitoneum. In the absence of recent procedure findings concerning for bowel perforation. Clinical correlation is recommended. CT of the abdomen pelvis may provide better evaluation if clinically indicated. Musculoskeletal: Severe degenerative changes of the left shoulder. No acute osseous pathology. IMPRESSION: 1. No CT evidence of metastatic disease in the chest. 2. Small bilateral pleural effusions  with associated partial compressive atelectasis of the lower lobes. Pneumonia is not excluded. Clinical correlation is recommended. 3. Partially visualized pneumoperitoneum. This may be related to recent surgery. Clinical correlation is recommended. 4.  Aortic Atherosclerosis (ICD10-I70.0) and Emphysema (ICD10-J43.9). These results were called by telephone at the time of interpretation on 12/14/2020 at 9:20 pm to the patient's nurse, worth, who verbally acknowledged these results. Electronically Signed   By: Anner Crete M.D.   On: 12/14/2020 21:23    Anti-infectives: Anti-infectives (From admission, onward)    Start     Dose/Rate Route Frequency Ordered Stop   12/04/20 2200  ceFEPIme (MAXIPIME) 2 g in sodium chloride 0.9 % 100 mL IVPB  Status:  Discontinued        2 g 200 mL/hr over 30 Minutes Intravenous Every 8 hours 12/04/20 1327 12/14/20 1352   12/04/20 2200  metroNIDAZOLE (FLAGYL) IVPB 500 mg  Status:  Discontinued        500 mg 100 mL/hr over 60 Minutes Intravenous Every 12 hours 12/04/20 1327 12/14/20 1352   12/02/20 1400  linezolid (ZYVOX) IVPB 600 mg  Status:  Discontinued        600 mg 300 mL/hr over 60 Minutes Intravenous Every 12 hours 12/02/20 1138 12/04/20 1327   11/30/20 1500  vancomycin (VANCOREADY) IVPB 1250 mg/250 mL  Status:  Discontinued        1,250 mg 166.7 mL/hr over 90 Minutes Intravenous Every 12 hours 11/30/20 0606 12/02/20 1138   11/30/20 0600  piperacillin-tazobactam (ZOSYN) IVPB 3.375 g  Status:  Discontinued        3.375 g 12.5 mL/hr over 240 Minutes Intravenous Every 8 hours 11/30/20 0237 12/04/20 1327   11/30/20 0403  piperacillin-tazobactam (ZOSYN) 3.375 GM/50ML IVPB       Note to Pharmacy: Hedy Camara   : cabinet override      11/30/20 0403 12/01/20 0206   11/30/20 0230  clindamycin (CLEOCIN) IVPB 600 mg  Status:  Discontinued        600 mg 100 mL/hr over 30 Minutes Intravenous  Once 11/30/20 0219 11/30/20 0237   11/30/20 0000  ceFEPIme (MAXIPIME) 2 g in sodium chloride 0.9 % 100 mL IVPB        2 g 200 mL/hr over 30 Minutes Intravenous  Once 11/29/20 2347 11/30/20 0033   11/30/20 0000  vancomycin (VANCOREADY) IVPB 1500 mg/300 mL        1,500 mg 150 mL/hr over 120 Minutes Intravenous  Once  11/29/20 2347 11/30/20 0300   11/29/20 2345  vancomycin (VANCOCIN) IVPB 1000 mg/200 mL premix  Status:  Discontinued        1,000 mg 200 mL/hr over 60 Minutes Intravenous  Once 11/29/20 2340 11/29/20 2347   11/29/20 2345  ceFEPIme (MAXIPIME) 1 g in sodium chloride 0.9 % 100 mL IVPB  Status:  Discontinued        1 g 200 mL/hr over 30 Minutes Intravenous  Once 11/29/20 2340 11/29/20 2347        Assessment/Plan POD14 s/p EUA, rectal wall bx, I&D of perirectal, scrotal, penile and abdominal regions for Fournier's gangrene - Dr. Johney Maine w/ Dr. Alinda Money - 11/30/20 POD14 s/p I&D of perirectal, scrotal, penile and abdominal regions for Fournier's gangrene, rectal stricturing/possible cancer - Dr. Johney Maine and Dr. Alinda Money - 12/01/20 POD10 s/p EUA w/ I&D for Fournier's gangrene - Dr. Hassell Done and Dr. Alinda Money - 12/04/20 - Wounds clean. No further debridement indicated at this time POD2 s/p lap converted to open diverting loop  colostomy Dr. Harlow Asa 12/13/2020   -Continue packing wounds / dressing changes.  Now that he is diverted, can transition to just daily in order to endorse micro debridement of the accumulating fibrinous exudate - [place] for periwound fungal infection around right perineum incision . Avoid tape (use mesh undergarment). Keep periwound clean and dry - E. Coli, Enterobacter cloacae, Bacteroides Ovatus, Beta Lactamase + -he has had 2 weeks of IV antibiotics and over 7 weeks since his last surgical debridement.  Okay to stop antibiotics and follow - Foley per urology - Appreciate CCM/TRH assistance   Adenocarcinoma of the rectum Rectal Stricturing - Noted from bx of rectal wall - GI consulted for colonoscopy - recommending waiting until perineal wounds have healed - S/p fecal diversion with loop colostomy 7/8.     - CEA 16.7 - Oncology consult - Has had CT A/P during admission. CT chest 7/9 shows no evidence of metastatic disease.   FEN - Reg VTE - SCDs, Lovenox ID - Antibiotics  completed Foley - in place   Shock - resolved.  No need for tele from surgery standpoint, ok to transfer to [place] use ABL anemia w/ + FOBT - hgb stable  LOS: 15 days    Dwan Bolt, MD [facility] 12/15/2020, 7:25 AM Please see Amion for pager number during day hours 7:00am-4:30pm

## 2020-12-15 NOTE — Progress Notes (Signed)
PROGRESS NOTE    James Coffey  OVZ:[id number] DOB: [birth year] DOA: 11/29/2020 PCP: Pcp, No  Brief Narrative:  This 50 years old male with PMH significant for tobacco abuse, history of rectal cancer presented in the ED with septic shock secondary to Fournier's gangrene of the perirectal, scrotal, penile and abdominal regions.  Patient was initially admitted in the ICU,  he was requiring IV pressors to maintain blood pressure.  Blood pressure has improved.  He is off pressors now,  he underwent multiple incision and drainage and surgical debridements,  general surgery and urology is following.  Patient is on IV antibiotics.  He is started on midodrine for blood pressure control.  Significant hospital events. 6/25 I&D and debridement. Central line placement 6/26 Second I&D 6/28 Hypokalemic overnight, Levophed slightly increased to 66mcg 6/29 third I&D 6/30 remains on levo, great urine output. Received one unit PRBC overnight for hgb 7.1 7/1 PICC line placed and CVC removed overnight, pressor requirement decreased with addition of midodrine D/c'd levophed. 7/8: Diagnostic laparoscopy, open descending loop colostomy 7/9: CT chest without evidence of metastatic disease.    Assessment & Plan:   Principal Problem:   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 Active Problems:   Perirectal abscess   Proctitis   Sepsis ([place])   Tobacco abuse   Partial-English speaking patient - Prefers [place] or [place]   Fourniers gangrene   Stricture of rectum   Malnutrition of moderate degree   Rectal cancer (HCC)  Septic shock secondary to Fournier's gangrene: Patient was hypotensive, tachycardic,  tachypneic with focus of infection on arrival. He was initially admitted in ICU , requiring Levophed support. General surgery was consulted,  He underwent multiple surgical debridements on 6/25, 6/26, 6/29. Continue adequate pain control with oxycodone. Daily wound assessment, continue wound care Culture grow: E. Coli,  Enterobacter cloacae, Bacteroides Ovatus, Beta Lactamase + Completed (cefepime and Flagyl)  x 2 weeks.  IV antibiotic discontinued  on7/9/22. Continue midodrine 10 mg 3 times daily Patient is off Levophed.  Sepsis physiology is improving. Shock is resolved. Encourage oral hydration.  Adenocarcinoma of rectum. It was noted during debridement of Fournier's gangrene. GI following, recommended colonoscopy once patient is medically stable/perineal wounds are healed. Patient requested clinical staff not to discuss cancer diagnosis with his family members. Avoid rectal tube as per PCCM recommendation.  CT chest completed without metastatic disease. He underwent diagnostic laparoscopy, open descending loop colostomy 12/13/20 , tolerated well. Needs oncology consult. Continue Foley catheter per urology.  Tobacco use disorder: Nicotine patch.  Normocytic normochromic anemia. Hb remained stable,  continue iron supplementation.  DVT prophylaxis: Lovenox Code Status: Full code. Family Communication: No family at bed side. Disposition Plan:   Status is: Inpatient  Remains inpatient appropriate because:Inpatient level of care appropriate due to severity of illness  Dispo: The patient is from: Home              Anticipated d/c is to: Home              Patient currently is not medically stable to d/c.   Difficult to place patient No  Consultants:  General surgery, urology, PCCM  Procedures: Multiple incision and drainage and debridements  Antimicrobials:   Anti-infectives (From admission, onward)    Start     Dose/Rate Route Frequency Ordered Stop   12/04/20 2200  ceFEPIme (MAXIPIME) 2 g in sodium chloride 0.9 % 100 mL IVPB  Status:  Discontinued        2 g 200 mL/hr over  30 Minutes Intravenous Every 8 hours 12/04/20 1327 12/14/20 1352   12/04/20 2200  metroNIDAZOLE (FLAGYL) IVPB 500 mg  Status:  Discontinued        500 mg 100 mL/hr over 60 Minutes Intravenous Every 12 hours 12/04/20  1327 12/14/20 1352   12/02/20 1400  linezolid (ZYVOX) IVPB 600 mg  Status:  Discontinued        600 mg 300 mL/hr over 60 Minutes Intravenous Every 12 hours 12/02/20 1138 12/04/20 1327   11/30/20 1500  vancomycin (VANCOREADY) IVPB 1250 mg/250 mL  Status:  Discontinued        1,250 mg 166.7 mL/hr over 90 Minutes Intravenous Every 12 hours 11/30/20 0606 12/02/20 1138   11/30/20 0600  piperacillin-tazobactam (ZOSYN) IVPB 3.375 g  Status:  Discontinued        3.375 g 12.5 mL/hr over 240 Minutes Intravenous Every 8 hours 11/30/20 0237 12/04/20 1327   11/30/20 0403  piperacillin-tazobactam (ZOSYN) 3.375 GM/50ML IVPB       Note to Pharmacy: Hedy Camara   : cabinet override      11/30/20 0403 12/01/20 0206   11/30/20 0230  clindamycin (CLEOCIN) IVPB 600 mg  Status:  Discontinued        600 mg 100 mL/hr over 30 Minutes Intravenous  Once 11/30/20 0219 11/30/20 0237   11/30/20 0000  ceFEPIme (MAXIPIME) 2 g in sodium chloride 0.9 % 100 mL IVPB        2 g 200 mL/hr over 30 Minutes Intravenous  Once 11/29/20 2347 11/30/20 0033   11/30/20 0000  vancomycin (VANCOREADY) IVPB 1500 mg/300 mL        1,500 mg 150 mL/hr over 120 Minutes Intravenous  Once 11/29/20 2347 11/30/20 0300   11/29/20 2345  vancomycin (VANCOCIN) IVPB 1000 mg/200 mL premix  Status:  Discontinued        1,000 mg 200 mL/hr over 60 Minutes Intravenous  Once 11/29/20 2340 11/29/20 2347   11/29/20 2345  ceFEPIme (MAXIPIME) 1 g in sodium chloride 0.9 % 100 mL IVPB  Status:  Discontinued        1 g 200 mL/hr over 30 Minutes Intravenous  Once 11/29/20 2340 11/29/20 2347        Subjective: Patient was seen and examined at bedside.  Overnight events noted.  He underwent diagnostic laparoscopy, open descending loop colostomy 7/8, tolerated well. Blood pressure has improved.  Wounds are healing better. Brownish stool noted in colostomy bag. He reports feeling much improved.  Pain is better controlled.   Objective: Vitals:   12/14/20  1200 12/14/20 1207 12/14/20 2209 12/15/20 0624  BP: 108/69 108/69 113/76 121/81  Pulse:  75 70 88  Resp:  18 20 20   Temp:  98 F (36.7 C) 98.6 F (37 C) 98.4 F (36.9 C)  TempSrc:  Oral Oral Oral  SpO2:  95% 97% 94%  Weight:      Height:        Intake/Output Summary (Last 24 hours) at 12/15/2020 1150 Last data filed at 12/15/2020 1112 Gross per 24 hour  Intake 360 ml  Output 1400 ml  Net -1040 ml   Filed Weights   11/30/20 0800 12/10/20 1703 12/13/20 0920  Weight: 75.1 kg 64 kg 64 kg    Examination:  General exam: Appears calm and comfortable , not in any acute distress. Respiratory system: Clear to auscultation. Respiratory effort normal. Cardiovascular system: S1 & S2 heard, RRR. No JVD, murmurs, rubs, gallops or clicks. No pedal edema. Gastrointestinal system:  Abdomen is nondistended, soft and mildly tender, no organomegaly or masses felt.  Normal bowel sounds heard. Colostomy bag , brownish stools noted. midline surgical scar. Multiple wounds noted but which are covered in dressing. Central nervous system: Alert and oriented. No focal neurological deficits. Extremities: Symmetric 5 x 5 power.  No edema, no cyanosis, no clubbing. Skin: No rashes, lesions or ulcers Psychiatry: Judgement and insight appear normal. Mood & affect appropriate.     Data Reviewed: I have personally reviewed following labs and imaging studies  CBC: Recent Labs  Lab 12/11/20 0314 12/12/20 0432 12/13/20 0416 12/14/20 0304 12/15/20 0350  WBC 13.2* 13.1* 10.8* 12.9* 10.9*  NEUTROABS 11.0*  --   --   --   --   HGB 9.5* 9.7* 9.9* 10.3* 10.8*  HCT 31.4* 32.2* 33.5* 33.5* 35.6*  MCV 95.2 95.5 97.1 94.1 95.2  PLT 513* 458* 478* 488* 527*   Basic Metabolic Panel: Recent Labs  Lab 12/11/20 0314 12/12/20 0432 12/13/20 0416 12/14/20 0304 12/15/20 0350  NA 140 140 139 139 140  K 3.9 4.1 4.0 3.8 3.4*  CL 106 108 107 103 105  CO2 26 26 27 30 28   GLUCOSE 121* 135* 128* 130* 103*  BUN 15  17 16 15 13   CREATININE 0.50* 0.58* 0.50* 0.58* 0.46*  CALCIUM 8.9 8.9 8.9 9.1 8.8*  MG 2.6* 2.2 2.2 2.2  --   PHOS  --  3.2 3.3 3.8  --    GFR: Estimated Creatinine Clearance: 98.9 mL/min (A) (by C-G formula based on SCr of 0.46 mg/dL (L)). Liver Function Tests: Recent Labs  Lab 12/12/20 0432 12/14/20 0304  AST 14* 15  ALT 16 16  ALKPHOS 62 59  BILITOT 0.6 0.8  PROT 6.2* 6.5  ALBUMIN 2.7* 2.8*   No results for input(s): LIPASE, AMYLASE in the last 168 hours. No results for input(s): AMMONIA in the last 168 hours. Coagulation Profile: No results for input(s): INR, PROTIME in the last 168 hours. Cardiac Enzymes: No results for input(s): CKTOTAL, CKMB, CKMBINDEX, TROPONINI in the last 168 hours. BNP (last 3 results) No results for input(s): PROBNP in the last 8760 hours. HbA1C: No results for input(s): HGBA1C in the last 72 hours. CBG: Recent Labs  Lab 12/14/20 0724 12/14/20 1204 12/14/20 1746 12/14/20 2207 12/15/20 0729  GLUCAP 128* 120* 117* 108* 113*   Lipid Profile: No results for input(s): CHOL, HDL, LDLCALC, TRIG, CHOLHDL, LDLDIRECT in the last 72 hours. Thyroid Function Tests: No results for input(s): TSH, T4TOTAL, FREET4, T3FREE, THYROIDAB in the last 72 hours. Anemia Panel: No results for input(s): VITAMINB12, FOLATE, FERRITIN, TIBC, IRON, RETICCTPCT in the last 72 hours. Sepsis Labs: No results for input(s): PROCALCITON, LATICACIDVEN in the last 168 hours.  Recent Results (from the past 240 hour(s))  MRSA Next Gen by PCR, Nasal     Status: None   Collection Time: 12/08/20  8:38 AM   Specimen: Nasal Mucosa; Nasal Swab  Result Value Ref Range Status   MRSA by PCR Next Gen NOT DETECTED NOT DETECTED Final    Comment: (NOTE) The GeneXpert MRSA Assay (FDA approved for NASAL specimens only), is one component of a comprehensive MRSA colonization surveillance program. It is not intended to diagnose MRSA infection nor to guide or monitor treatment for MRSA  infections. Test performance is not FDA approved in patients less than 67 years old. Performed at [facility], [place] [address], [place], [place] [address]     Radiology Studies: CT CHEST  W CONTRAST  Result Date: 12/14/2020 CLINICAL DATA:  50 year old male with colorectal cancer staging. EXAM: CT CHEST WITH CONTRAST TECHNIQUE: Multidetector CT imaging of the chest was performed during intravenous contrast administration. CONTRAST:  63mL OMNIPAQUE IOHEXOL 300 MG/ML  SOLN COMPARISON:  Chest radiograph dated 11/30/2020. CT abdomen pelvis dated 11/30/2020. FINDINGS: Evaluation of this exam is limited due to respiratory motion artifact. Cardiovascular: Borderline cardiomegaly. Three vessel coronary vascular calcification. No pericardial effusion. Mild atherosclerotic calcification of the thoracic aorta. No aneurysmal dilatation or dissection. The origins of the great vessels of the aortic arch appear patent as visualized. The central pulmonary arteries are grossly unremarkable for the degree of opacification. Right-sided PICC with tip in the right atrium. Mediastinum/Nodes: No hilar or mediastinal adenopathy. The esophagus and thyroid gland are grossly unremarkable. No mediastinal fluid collection. Lungs/Pleura: Small bilateral pleural effusions with associated partial compressive atelectasis of the lower lobes. Pneumonia is not excluded clinical correlation is recommended. Mild paraseptal emphysema. There is no pneumothorax. Mucus secretions noted in the right mainstem bronchus. The airways remain patent. Upper Abdomen: Partially visualized pneumoperitoneum. In the absence of recent procedure findings concerning for bowel perforation. Clinical correlation is recommended. CT of the abdomen pelvis may provide better evaluation if clinically indicated. Musculoskeletal: Severe degenerative changes of the left shoulder. No acute osseous pathology. IMPRESSION: 1. No CT evidence of metastatic disease  in the chest. 2. Small bilateral pleural effusions with associated partial compressive atelectasis of the lower lobes. Pneumonia is not excluded. Clinical correlation is recommended. 3. Partially visualized pneumoperitoneum. This may be related to recent surgery. Clinical correlation is recommended. 4. Aortic Atherosclerosis (ICD10-I70.0) and Emphysema (ICD10-J43.9). These results were called by telephone at the time of interpretation on 12/14/2020 at 9:20 pm to the patient's nurse, worth, who verbally acknowledged these results. Electronically Signed   By: Anner Crete M.D.   On: 12/14/2020 21:23    Scheduled Meds:  (feeding supplement) PROSource Plus  30 mL Oral BID BM   acetaminophen  1,000 mg Oral Q6H   vitamin C  500 mg Oral BID   bacitracin   Topical BID   Chlorhexidine Gluconate Cloth  6 each Topical Daily   cholestyramine light  4 g Oral QID   enoxaparin (LOVENOX) injection  40 mg Subcutaneous Daily   feeding supplement  237 mL Oral BID BM   ferrous sulfate  325 mg Oral Q breakfast   hydrocortisone  30 mg Oral Daily   Followed by   Derrill Memo ON 12/17/2020] hydrocortisone  20 mg Oral Daily   Followed by   Derrill Memo ON 12/20/2020] hydrocortisone  10 mg Oral Daily   Followed by   Derrill Memo ON 12/23/2020] hydrocortisone  5 mg Oral Daily   insulin aspart  0-9 Units Subcutaneous TID WC   lip balm  1 application Topical BID   liver oil-zinc oxide   Topical BID   mouth rinse  15 mL Mouth Rinse BID   midodrine  10 mg Oral TID WC   multivitamin with minerals  1 tablet Oral Daily   nicotine  21 mg Transdermal Daily   nutrition supplement (JUVEN)  1 packet Oral BID BM   pantoprazole  40 mg Oral QHS   sodium chloride flush  10-40 mL Intracatheter Q12H   zinc sulfate  220 mg Oral Daily   Continuous Infusions:   LOS: 15 days    Time spent: 25 mins  Shawna Clamp, MD [facility]   If 7PM-7AM, please contact night-coverage

## 2020-12-16 LAB — BASIC METABOLIC PANEL
Anion gap: 9 (ref 5–15)
BUN: 10 mg/dL (ref 6–20)
CO2: 27 mmol/L (ref 22–32)
Calcium: 8.8 mg/dL — ABNORMAL LOW (ref 8.9–10.3)
Chloride: 103 mmol/L (ref 98–111)
Creatinine, Ser: 0.5 mg/dL — ABNORMAL LOW (ref 0.61–1.24)
GFR, Estimated: >60 mL/min (ref >60)
Glucose, Bld: 110 mg/dL — ABNORMAL HIGH (ref 70–99)
Potassium: 3.4 mmol/L — ABNORMAL LOW (ref 3.5–5.1)
Sodium: 139 mmol/L (ref 135–145)

## 2020-12-16 LAB — GLUCOSE, CAPILLARY
Glucose-Capillary: 101 mg/dL — ABNORMAL HIGH (ref 70–99)
Glucose-Capillary: 155 mg/dL — ABNORMAL HIGH (ref 70–99)
Glucose-Capillary: 138 mg/dL — ABNORMAL HIGH (ref 70–99)
Glucose-Capillary: 137 mg/dL — ABNORMAL HIGH (ref 70–99)

## 2020-12-16 MED ORDER — IBUPROFEN 400 MG PO TABS
600.0000 mg | ORAL_TABLET | Freq: Three times a day (TID) | ORAL | Status: DC
  Administered 2020-12-16 – 2020-12-19 (×9): 600 mg via ORAL
  Filled 2020-12-16 (×9): qty 1

## 2020-12-16 MED ORDER — POTASSIUM CHLORIDE 20 MEQ PO PACK
40.0000 meq | PACK | Freq: Once | ORAL | Status: AC
  Administered 2020-12-16: 40 meq via ORAL
  Filled 2020-12-16: qty 2

## 2020-12-16 MED ORDER — METHOCARBAMOL 500 MG PO TABS
500.0000 mg | ORAL_TABLET | Freq: Three times a day (TID) | ORAL | Status: DC
  Administered 2020-12-16 – 2020-12-19 (×10): 500 mg via ORAL
  Filled 2020-12-16 (×10): qty 1

## 2020-12-16 NOTE — Discharge Instructions (Addendum)
[facility]      [facility] °[phone] ° °OPEN ABDOMINAL SURGERY: POST OP INSTRUCTIONS ° °Always review your discharge instruction sheet given to you by the facility where your surgery was performed. ° °IF YOU HAVE DISABILITY OR FAMILY LEAVE FORMS, YOU MUST BRING THEM TO THE OFFICE FOR PROCESSING.  PLEASE DO NOT GIVE THEM TO YOUR DOCTOR. ° °A prescription for pain medication may be given to you upon discharge.  Take your pain medication as prescribed, if needed.  If narcotic pain medicine is not needed, then you may take acetaminophen (Tylenol) or ibuprofen (Advil) as needed. °Take your usually prescribed medications unless otherwise directed. °If you need a refill on your pain medication, please contact your pharmacy. They will contact our office to request authorization.  Prescriptions will not be filled after 5pm or on week-ends. °You should follow a light diet the first few days after arrival home, such as soup and crackers, pudding, etc.unless your doctor has advised otherwise. A high-fiber, low fat diet can be resumed as tolerated.   Be sure to include lots of fluids daily. Most patients will experience some swelling and bruising on the chest and neck area.  Ice packs will help.  Swelling and bruising can take several days to resolve °Most patients will experience some swelling and bruising in the area of the incision. Ice pack will help. Swelling and bruising can take several days to resolve..  °It is common to experience some constipation if taking pain medication after surgery.  Increasing fluid intake and taking a stool softener will usually help or prevent this problem from occurring.  A mild laxative (Milk of Magnesia or Miralax) should be taken according to package directions if there are no bowel movements after 48 hours. ° You may have steri-strips (small skin tapes) in place directly over the incision.  These strips should be left on the skin for 7-10 days.  If your surgeon used skin  glue on the incision, you may shower in 24 hours.  The glue will flake off over the next 2-3 weeks.  Any sutures or staples will be removed at the office during your follow-up visit. You may find that a light gauze bandage over your incision may keep your staples from being rubbed or pulled. You may shower and replace the bandage daily. °ACTIVITIES:  You may resume regular (light) daily activities beginning the next day--such as daily self-care, walking, climbing stairs--gradually increasing activities as tolerated.  You may have sexual intercourse when it is comfortable.  Refrain from any heavy lifting or straining until approved by your doctor. °You may drive when you no longer are taking prescription pain medication, you can comfortably wear a seatbelt, and you can safely maneuver your car and apply brakes ° °You should see your doctor in the office for a follow-up appointment approximately two weeks after your surgery.  Make sure that you call for this appointment within a day or two after you arrive home to insure a convenient appointment time. ° °WHEN TO CALL YOUR DOCTOR: °Fever over 101.0 °Inability to urinate °Nausea and/or vomiting °Extreme swelling or bruising °Continued bleeding from incision. °Increased pain, redness, or drainage from the incision. °Difficulty swallowing or breathing °Muscle cramping or spasms. °Numbness or tingling in hands or feet or around lips. ° °The clinic staff is available to answer your questions during regular business hours.  Please don’t hesitate to call and ask to speak to one of the nurses if you have concerns. ° °For   For further questions, please visit [email or web address]   WOUND CARE: - dressings to be changed twice daily - supplies: sterile saline, guaze, scissors, ABD pads, tape  - remove dressing and all packing carefully, moistening with sterile saline as needed to avoid packing/internal dressing sticking to the wound. - clean edges of skin around the  wound with water/gauze, making sure there is no tape debris or leakage left on skin that could cause skin irritation or breakdown. - dampen clean gauze with sterile saline and pack wound from wound base to skin level, making sure to take note of any possible areas of wound tracking, tunneling and packing appropriately. Wound can be packed loosely. Trim gauze to size if a whole piece is not required. - cover wound with a dry dressing and secure with tape.  - write the date/time on the dry dressing/tape to better track when the last dressing change occurred. - apply any skin protectant/powder recommended by clinician to protect skin/skin folds. - change dressing as needed if leakage occurs, wound gets contaminated, or patient requests to shower. - patient may shower daily with wound open and following the shower the wound should be dried and a clean dressing placed.    DO ABOVE WOUND CARE FOR PERINEAL WOUNDS, SUPRAPUBIC WOUND, AND INFRAUMBILICAL WOUND.   FOR LATERAL ABDOMINAL WOUNDS - APPLY ANTIBIOTIC OINTMENT AND DRY DRESSINGS.

## 2020-12-16 NOTE — Consult Note (Signed)
[facility] Nurse ostomy follow up Stoma type/location: LUQ, loop colostomy Stomal assessment/size: oval shaped; 1 3/4" x 2 1/4"; pink, moist, mildly flush with skin Peristomal assessment: intact Treatment options for stomal/peristomal skin: 2" skin barrier Output liquid dark brown Ostomy pouching: 2pc. 2 3/4 with 2" skin barrier ring Education provided:  Patient lives at home with daughters and wife (they are on vacation in [place]). He has one daughter that is a Designer, multimedia on the 5th floor here at [employer]; she is not here in the room today  Explained creation of stoma; specifically loop stoma  Demonstrated pouch change (cutting new skin barrier, measuring stoma, cleaning peristomal skin and stoma, use of barrier ring) Education on emptying when 1/3 to 1/2 full and how to empty Demonstrated use of wick to clean spout   Enrolled patient in [employer] Discharge program: Yes   [place] Nurse will follow along with you for continued support with ostomy teaching and care [place] MSN, RN, [place], [place], [place]

## 2020-12-16 NOTE — Progress Notes (Signed)
Physical Therapy Treatment Patient Details Name: James Coffey MRN: [id number] DOB: June 03, 1971 Today's Date: 12/16/2020    History of Present Illness 50 yo male admitted with fournier's gangrene. S/P I&D perirectal, scrotal, and abdomen. Post op hypotension. Also found to have rectal ca.    PT Comments    Patient making good progress with acute PT and progressed gait training with reduced UE support today using IV pole for support. No overt LOB noted and pt's posture improved with use of IV pole. He will continue to benefit from acute PT services to improve independence for safe discharge home. Encouraged pt to mobilize with RN/NT staff as able.      Follow Up Recommendations  No PT follow up;Supervision/Assistance - 24 hour     Equipment Recommendations  None recommended by PT (TBA on RW)    Recommendations for Other Services       Precautions / Restrictions Precautions Precautions: Fall Precaution Comments: wouonds-packing but no outside dressing-donned ABD and mesh briefs Restrictions Weight Bearing Restrictions: No    Mobility  Bed Mobility Overal bed mobility: Needs Assistance Bed Mobility: Supine to Sit     Supine to sit: Mod assist;HOB elevated     General bed mobility comments: cues for use of bed rail and to assist with trunk due to abdomen pain    Transfers Overall transfer level: Needs assistance Equipment used: Rolling walker (2 wheeled) Transfers: Sit to/from Stand Sit to Stand: Min guard         General transfer comment: pt taking extra time and cues for power up, guarding for safety  Ambulation/Gait Ambulation/Gait assistance: Min assist;Min guard Gait Distance (Feet): 160 Feet Assistive device: Rolling walker (2 wheeled);IV Pole Gait Pattern/deviations: Step-through pattern;Decreased stride length;Trunk flexed Gait velocity: decr   General Gait Details: cues for posture however pt limited by abdomenal pain. cues for safe proximity to RW and  pt progressed to min guard. Assist to transition to IV pole for gait and pt progressed to min guard with Rt UE support on IV pole. no overt LOB.   Stairs             Wheelchair Mobility    Modified Rankin (Stroke Patients Only)       Balance Overall balance assessment: Needs assistance Sitting-balance support: Feet supported Sitting balance-Leahy Scale: Good     Standing balance support: Single extremity supported;During functional activity Standing balance-Leahy Scale: Fair                              Cognition Arousal/Alertness: Awake/alert Behavior During Therapy: WFL for tasks assessed/performed Overall Cognitive Status: Within Functional Limits for tasks assessed                                        Exercises      General Comments        Pertinent Vitals/Pain Pain Assessment: Faces Faces Pain Scale: Hurts a little bit Pain Location: abdomen Pain Descriptors / Indicators: Discomfort;Guarding Pain Intervention(s): Limited activity within patient's tolerance;Monitored during session;Repositioned    Home Living                      Prior Function            PT Goals (current goals can now be found in the care plan section) Acute Rehab PT  Goals Patient Stated Goal: less pain PT Goal Formulation: With patient Time For Goal Achievement: 12/24/20 Potential to Achieve Goals: Good Progress towards PT goals: Progressing toward goals    Frequency    Min 3X/week      PT Plan Current plan remains appropriate    Co-evaluation              AM-PAC PT "6 Clicks" Mobility   Outcome Measure  Help needed turning from your back to your side while in a flat bed without using bedrails?: A Little Help needed moving from lying on your back to sitting on the side of a flat bed without using bedrails?: A Little Help needed moving to and from a bed to a chair (including a wheelchair)?: A Little Help needed standing  up from a chair using your arms (e.g., wheelchair or bedside chair)?: A Little Help needed to walk in hospital room?: A Little Help needed climbing 3-5 steps with a railing? : A Little 6 Click Score: 18    End of Session Equipment Utilized During Treatment: Gait belt Activity Tolerance: Patient tolerated treatment well Patient left: in chair;with call bell/phone within reach Nurse Communication: Mobility status PT Visit Diagnosis: Muscle weakness (generalized) (M62.81);Pain;Unsteadiness on feet (R26.81)     Time: 4098-1191 PT Time Calculation (min) (ACUTE ONLY): 24 min  Charges:  $Gait Training: 23-37 mins                     James Coffey, DPT Acute Rehabilitation Services Office [phone] Pager [phone]    James Coffey 12/16/2020, 7:17 PM

## 2020-12-16 NOTE — Progress Notes (Signed)
PROGRESS NOTE    James Coffey  GHW:[id number] DOB: June 06, 1971 DOA: 11/29/2020 PCP: Pcp, No  Brief Narrative:  This 50 years old male with PMH significant for tobacco abuse, history of rectal cancer presented in the ED with septic shock secondary to Fournier's gangrene of the perirectal, scrotal, penile and abdominal regions.  Patient was initially admitted in the ICU,  he was requiring IV pressors to maintain blood pressure.  Blood pressure has improved.  He is off pressors now,  he underwent multiple incision and drainage and surgical debridements,  general surgery and urology is following.  Patient is on IV antibiotics.  He is started on midodrine for blood pressure control.  Significant hospital events. 6/25 I&D and debridement. Central line placement 6/26 Second I&D 6/28 Hypokalemic overnight, Levophed slightly increased to 62mcg 6/29 third I&D 6/30 remains on levo, great urine output. Received one unit PRBC overnight for hgb 7.1 7/1 PICC line placed and CVC removed overnight, pressor requirement decreased with addition of midodrine D/c'd levophed. 7/8: Diagnostic laparoscopy, open descending loop colostomy 7/9: CT chest without evidence of metastatic disease.    Assessment & Plan:   Principal Problem:   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 Active Problems:   Perirectal abscess   Proctitis   Sepsis ([place])   Tobacco abuse   Partial-English speaking patient - Prefers [place] or [place]   Fourniers gangrene   Stricture of rectum   Malnutrition of moderate degree   Rectal cancer (HCC)  Septic shock secondary to Fournier's gangrene: Patient was hypotensive, tachycardic,  tachypneic with focus of infection on arrival. He was initially admitted in ICU , requiring Levophed support. General surgery was consulted,  He underwent multiple surgical debridements on 6/25, 6/26, 6/29. Continue adequate pain control with oxycodone. Daily wound assessment, continue wound care Culture grow: E. Coli,  Enterobacter cloacae, Bacteroides Ovatus, Beta Lactamase + Completed (cefepime and Flagyl)  x 2 weeks.  IV antibiotic discontinued on 12/14/20. Continue midodrine 10 mg 3 times daily Patient is off Levophed.  Sepsis physiology is improving. Shock is resolved. Encourage oral hydration. Continue wound care.  Adenocarcinoma of rectum. It was noted during debridement of Fournier's gangrene. GI following, recommended colonoscopy once patient is medically stable / perineal wounds are healed. Patient requested clinical staff not to discuss cancer diagnosis with his family members. Avoid rectal tube as per PCCM recommendation.  CT chest completed without metastatic disease. He underwent diagnostic laparoscopy, open descending loop colostomy 12/13/20 , tolerated well. Oncology consulted,  awaiting recommendation. Continue Foley catheter per urology.  Tobacco use disorder: Nicotine patch.  Normocytic normochromic anemia. Hb remained stable,  continue iron supplementation.  DVT prophylaxis: Lovenox Code Status: Full code. Family Communication: No family at bed side. Disposition Plan:   Status is: Inpatient  Remains inpatient appropriate because:Inpatient level of care appropriate due to severity of illness  Dispo: The patient is from: Home              Anticipated d/c is to: Home              Patient currently is not medically stable to d/c.   Difficult to place patient No  Consultants:  General surgery, urology, PCCM  Procedures: Multiple incision and drainage and debridements  Antimicrobials:   Anti-infectives (From admission, onward)    Start     Dose/Rate Route Frequency Ordered Stop   12/04/20 2200  ceFEPIme (MAXIPIME) 2 g in sodium chloride 0.9 % 100 mL IVPB  Status:  Discontinued  2 g 200 mL/hr over 30 Minutes Intravenous Every 8 hours 12/04/20 1327 12/14/20 1352   12/04/20 2200  metroNIDAZOLE (FLAGYL) IVPB 500 mg  Status:  Discontinued        500 mg 100 mL/hr over 60  Minutes Intravenous Every 12 hours 12/04/20 1327 12/14/20 1352   12/02/20 1400  linezolid (ZYVOX) IVPB 600 mg  Status:  Discontinued        600 mg 300 mL/hr over 60 Minutes Intravenous Every 12 hours 12/02/20 1138 12/04/20 1327   11/30/20 1500  vancomycin (VANCOREADY) IVPB 1250 mg/250 mL  Status:  Discontinued        1,250 mg 166.7 mL/hr over 90 Minutes Intravenous Every 12 hours 11/30/20 0606 12/02/20 1138   11/30/20 0600  piperacillin-tazobactam (ZOSYN) IVPB 3.375 g  Status:  Discontinued        3.375 g 12.5 mL/hr over 240 Minutes Intravenous Every 8 hours 11/30/20 0237 12/04/20 1327   11/30/20 0403  piperacillin-tazobactam (ZOSYN) 3.375 GM/50ML IVPB       Note to Pharmacy: Hedy Camara   : cabinet override      11/30/20 0403 12/01/20 0206   11/30/20 0230  clindamycin (CLEOCIN) IVPB 600 mg  Status:  Discontinued        600 mg 100 mL/hr over 30 Minutes Intravenous  Once 11/30/20 0219 11/30/20 0237   11/30/20 0000  ceFEPIme (MAXIPIME) 2 g in sodium chloride 0.9 % 100 mL IVPB        2 g 200 mL/hr over 30 Minutes Intravenous  Once 11/29/20 2347 11/30/20 0033   11/30/20 0000  vancomycin (VANCOREADY) IVPB 1500 mg/300 mL        1,500 mg 150 mL/hr over 120 Minutes Intravenous  Once 11/29/20 2347 11/30/20 0300   11/29/20 2345  vancomycin (VANCOCIN) IVPB 1000 mg/200 mL premix  Status:  Discontinued        1,000 mg 200 mL/hr over 60 Minutes Intravenous  Once 11/29/20 2340 11/29/20 2347   11/29/20 2345  ceFEPIme (MAXIPIME) 1 g in sodium chloride 0.9 % 100 mL IVPB  Status:  Discontinued        1 g 200 mL/hr over 30 Minutes Intravenous  Once 11/29/20 2340 11/29/20 2347        Subjective: Patient was seen and examined at bedside.  Overnight events noted.  He underwent diagnostic laparoscopy, open descending loop colostomy 12/13/20, tolerated well. Blood pressure has improved.  Wounds are healing better. Brownish stool noted in colostomy bag. He reports feeling better, pain is controlled,  want  to do physical therapy and get better.   Objective: Vitals:   12/15/20 1215 12/15/20 1510 12/15/20 2145 12/16/20 0557  BP: 115/74 106/74 111/77 104/69  Pulse: 76 71 70 74  Resp: 17 18 16 18   Temp: 98.1 F (36.7 C)  99 F (37.2 C) 98.8 F (37.1 C)  TempSrc: Oral  Oral Oral  SpO2: 96% 97% 98% 97%  Weight:      Height:        Intake/Output Summary (Last 24 hours) at 12/16/2020 1111 Last data filed at 12/16/2020 1012 Gross per 24 hour  Intake 810 ml  Output 1350 ml  Net -540 ml   Filed Weights   11/30/20 0800 12/10/20 1703 12/13/20 0920  Weight: 75.1 kg 64 kg 64 kg    Examination:  General exam: Appears calm and comfortable , not in any acute distress. Appears Tired. Respiratory system: Clear to auscultation. Respiratory effort normal. Cardiovascular system: S1 & S2 heard,  RRR. No JVD, murmurs, rubs, gallops or clicks. No pedal edema. Gastrointestinal system: Abdomen is nondistended, soft and mildly tender, no organomegaly or masses felt.  Normal bowel sounds heard. Colostomy bag , brownish stools noted. midline surgical scar. Multiple wounds noted but which are covered in dressing. Central nervous system: Alert and oriented. No focal neurological deficits. Extremities: Symmetric 5 x 5 power.  No edema, no cyanosis, no clubbing. Skin: No rashes, lesions or ulcers Psychiatry: Judgement and insight appear normal. Mood & affect appropriate.     Data Reviewed: I have personally reviewed following labs and imaging studies  CBC: Recent Labs  Lab 12/11/20 0314 12/12/20 0432 12/13/20 0416 12/14/20 0304 12/15/20 0350  WBC 13.2* 13.1* 10.8* 12.9* 10.9*  NEUTROABS 11.0*  --   --   --   --   HGB 9.5* 9.7* 9.9* 10.3* 10.8*  HCT 31.4* 32.2* 33.5* 33.5* 35.6*  MCV 95.2 95.5 97.1 94.1 95.2  PLT 513* 458* 478* 488* 626*   Basic Metabolic Panel: Recent Labs  Lab 12/11/20 0314 12/12/20 0432 12/13/20 0416 12/14/20 0304 12/15/20 0350 12/16/20 0300  NA 140 140 139 139 140  139  K 3.9 4.1 4.0 3.8 3.4* 3.4*  CL 106 108 107 103 105 103  CO2 26 26 27 30 28 27   GLUCOSE 121* 135* 128* 130* 103* 110*  BUN 15 17 16 15 13 10   CREATININE 0.50* 0.58* 0.50* 0.58* 0.46* 0.50*  CALCIUM 8.9 8.9 8.9 9.1 8.8* 8.8*  MG 2.6* 2.2 2.2 2.2  --   --   PHOS  --  3.2 3.3 3.8  --   --    GFR: Estimated Creatinine Clearance: 98.9 mL/min (A) (by C-G formula based on SCr of 0.5 mg/dL (L)). Liver Function Tests: Recent Labs  Lab 12/12/20 0432 12/14/20 0304  AST 14* 15  ALT 16 16  ALKPHOS 62 59  BILITOT 0.6 0.8  PROT 6.2* 6.5  ALBUMIN 2.7* 2.8*   No results for input(s): LIPASE, AMYLASE in the last 168 hours. No results for input(s): AMMONIA in the last 168 hours. Coagulation Profile: No results for input(s): INR, PROTIME in the last 168 hours. Cardiac Enzymes: No results for input(s): CKTOTAL, CKMB, CKMBINDEX, TROPONINI in the last 168 hours. BNP (last 3 results) No results for input(s): PROBNP in the last 8760 hours. HbA1C: No results for input(s): HGBA1C in the last 72 hours. CBG: Recent Labs  Lab 12/15/20 0729 12/15/20 1207 12/15/20 1648 12/15/20 2146 12/16/20 0713  GLUCAP 113* 138* 116* 99 101*   Lipid Profile: No results for input(s): CHOL, HDL, LDLCALC, TRIG, CHOLHDL, LDLDIRECT in the last 72 hours. Thyroid Function Tests: No results for input(s): TSH, T4TOTAL, FREET4, T3FREE, THYROIDAB in the last 72 hours. Anemia Panel: No results for input(s): VITAMINB12, FOLATE, FERRITIN, TIBC, IRON, RETICCTPCT in the last 72 hours. Sepsis Labs: No results for input(s): PROCALCITON, LATICACIDVEN in the last 168 hours.  Recent Results (from the past 240 hour(s))  MRSA Next Gen by PCR, Nasal     Status: None   Collection Time: 12/08/20  8:38 AM   Specimen: Nasal Mucosa; Nasal Swab  Result Value Ref Range Status   MRSA by PCR Next Gen NOT DETECTED NOT DETECTED Final    Comment: (NOTE) The GeneXpert MRSA Assay (FDA approved for NASAL specimens only), is one  component of a comprehensive MRSA colonization surveillance program. It is not intended to diagnose MRSA infection nor to guide or monitor treatment for MRSA infections. Test performance is not FDA  approved in patients less than 70 years old. Performed at [facility], [place] [address], [place], [place] [address]     Radiology Studies: CT CHEST W CONTRAST  Result Date: 12/14/2020 CLINICAL DATA:  49 year old male with colorectal cancer staging. EXAM: CT CHEST WITH CONTRAST TECHNIQUE: Multidetector CT imaging of the chest was performed during intravenous contrast administration. CONTRAST:  1mL OMNIPAQUE IOHEXOL 300 MG/ML  SOLN COMPARISON:  Chest radiograph dated 11/30/2020. CT abdomen pelvis dated 11/30/2020. FINDINGS: Evaluation of this exam is limited due to respiratory motion artifact. Cardiovascular: Borderline cardiomegaly. Three vessel coronary vascular calcification. No pericardial effusion. Mild atherosclerotic calcification of the thoracic aorta. No aneurysmal dilatation or dissection. The origins of the great vessels of the aortic arch appear patent as visualized. The central pulmonary arteries are grossly unremarkable for the degree of opacification. Right-sided PICC with tip in the right atrium. Mediastinum/Nodes: No hilar or mediastinal adenopathy. The esophagus and thyroid gland are grossly unremarkable. No mediastinal fluid collection. Lungs/Pleura: Small bilateral pleural effusions with associated partial compressive atelectasis of the lower lobes. Pneumonia is not excluded clinical correlation is recommended. Mild paraseptal emphysema. There is no pneumothorax. Mucus secretions noted in the right mainstem bronchus. The airways remain patent. Upper Abdomen: Partially visualized pneumoperitoneum. In the absence of recent procedure findings concerning for bowel perforation. Clinical correlation is recommended. CT of the abdomen pelvis may provide better evaluation if  clinically indicated. Musculoskeletal: Severe degenerative changes of the left shoulder. No acute osseous pathology. IMPRESSION: 1. No CT evidence of metastatic disease in the chest. 2. Small bilateral pleural effusions with associated partial compressive atelectasis of the lower lobes. Pneumonia is not excluded. Clinical correlation is recommended. 3. Partially visualized pneumoperitoneum. This may be related to recent surgery. Clinical correlation is recommended. 4. Aortic Atherosclerosis (ICD10-I70.0) and Emphysema (ICD10-J43.9). These results were called by telephone at the time of interpretation on 12/14/2020 at 9:20 pm to the patient's nurse, worth, who verbally acknowledged these results. Electronically Signed   By: Anner Crete M.D.   On: 12/14/2020 21:23    Scheduled Meds:  (feeding supplement) PROSource Plus  30 mL Oral BID BM   acetaminophen  1,000 mg Oral Q6H   vitamin C  500 mg Oral BID   bacitracin   Topical BID   Chlorhexidine Gluconate Cloth  6 each Topical Daily   cholestyramine light  4 g Oral QID   enoxaparin (LOVENOX) injection  40 mg Subcutaneous Daily   feeding supplement  237 mL Oral BID BM   ferrous sulfate  325 mg Oral Q breakfast   [START ON 12/17/2020] hydrocortisone  20 mg Oral Daily   Followed by   Derrill Memo ON 12/20/2020] hydrocortisone  10 mg Oral Daily   Followed by   Derrill Memo ON 12/23/2020] hydrocortisone  5 mg Oral Daily   ibuprofen  600 mg Oral TID   insulin aspart  0-9 Units Subcutaneous TID WC   lip balm  1 application Topical BID   liver oil-zinc oxide   Topical BID   mouth rinse  15 mL Mouth Rinse BID   methocarbamol  500 mg Oral TID   midodrine  10 mg Oral TID WC   multivitamin with minerals  1 tablet Oral Daily   nicotine  21 mg Transdermal Daily   nutrition supplement (JUVEN)  1 packet Oral BID BM   pantoprazole  40 mg Oral QHS   sodium chloride flush  10-40 mL Intracatheter Q12H   zinc sulfate  220 mg Oral Daily  Continuous Infusions:   LOS: 16  days    Time spent: 25 mins  Shawna Clamp, MD [facility]   If 7PM-7AM, please contact night-coverage

## 2020-12-16 NOTE — Progress Notes (Signed)
Progress Note  3 Days Post-Op  Subjective: Patient reports increased pain in perineal wound over the weekend. He has not been out of bed over the weekend. He is tolerating diet and denies nausea. Hopeful to get home later this week.   Objective: Vital signs in last 24 hours: Temp:  [98.1 F (36.7 C)-99 F (37.2 C)] 98.8 F (37.1 C) (07/11 0557) Pulse Rate:  [70-76] 74 (07/11 0557) Resp:  [16-18] 18 (07/11 0557) BP: (104-115)/(69-77) 104/69 (07/11 0557) SpO2:  [96 %-98 %] 97 % (07/11 0557) Last BM Date: 12/15/20  Intake/Output from previous day: 07/10 0701 - 07/11 0700 In: 840 [P.O.:840] Out: 1350 [Urine:1350] Intake/Output this shift: Total I/O In: 210 [P.O.:210] Out: -   PE: Gen: Awake and alert, pleasant Lungs: Normal rate and effort Heart: RRR Abd: soft, NT, ND, suprapubic and infraumbilical wounds stable, lateral wounds shallow, midline incision c/d/I with staples present, colostomy viable with gas and some stool in pouch, red rubber bridge present  GU: foley present, perineal wound clean, barrier cream caked over buttocks and perineum   Lab Results:  Recent Labs    12/14/20 0304 12/15/20 0350  WBC 12.9* 10.9*  HGB 10.3* 10.8*  HCT 33.5* 35.6*  PLT 488* 445*   BMET Recent Labs    12/15/20 0350 12/16/20 0300  NA 140 139  K 3.4* 3.4*  CL 105 103  CO2 28 27  GLUCOSE 103* 110*  BUN 13 10  CREATININE 0.46* 0.50*  CALCIUM 8.8* 8.8*   PT/INR No results for input(s): LABPROT, INR in the last 72 hours. CMP     Component Value Date/Time   NA 139 12/16/2020 0300   K 3.4 (L) 12/16/2020 0300   CL 103 12/16/2020 0300   CO2 27 12/16/2020 0300   GLUCOSE 110 (H) 12/16/2020 0300   BUN 10 12/16/2020 0300   CREATININE 0.50 (L) 12/16/2020 0300   CALCIUM 8.8 (L) 12/16/2020 0300   PROT 6.5 12/14/2020 0304   ALBUMIN 2.8 (L) 12/14/2020 0304   AST 15 12/14/2020 0304   ALT 16 12/14/2020 0304   ALKPHOS 59 12/14/2020 0304   BILITOT 0.8 12/14/2020 0304    GFRNONAA >60 12/16/2020 0300   Lipase     Component Value Date/Time   LIPASE 22 11/03/2020 1714       Studies/Results: CT CHEST W CONTRAST  Result Date: 12/14/2020 CLINICAL DATA:  50 year old male with colorectal cancer staging. EXAM: CT CHEST WITH CONTRAST TECHNIQUE: Multidetector CT imaging of the chest was performed during intravenous contrast administration. CONTRAST:  84mL OMNIPAQUE IOHEXOL 300 MG/ML  SOLN COMPARISON:  Chest radiograph dated 11/30/2020. CT abdomen pelvis dated 11/30/2020. FINDINGS: Evaluation of this exam is limited due to respiratory motion artifact. Cardiovascular: Borderline cardiomegaly. Three vessel coronary vascular calcification. No pericardial effusion. Mild atherosclerotic calcification of the thoracic aorta. No aneurysmal dilatation or dissection. The origins of the great vessels of the aortic arch appear patent as visualized. The central pulmonary arteries are grossly unremarkable for the degree of opacification. Right-sided PICC with tip in the right atrium. Mediastinum/Nodes: No hilar or mediastinal adenopathy. The esophagus and thyroid gland are grossly unremarkable. No mediastinal fluid collection. Lungs/Pleura: Small bilateral pleural effusions with associated partial compressive atelectasis of the lower lobes. Pneumonia is not excluded clinical correlation is recommended. Mild paraseptal emphysema. There is no pneumothorax. Mucus secretions noted in the right mainstem bronchus. The airways remain patent. Upper Abdomen: Partially visualized pneumoperitoneum. In the absence of recent procedure findings concerning for bowel perforation. Clinical correlation  is recommended. CT of the abdomen pelvis may provide better evaluation if clinically indicated. Musculoskeletal: Severe degenerative changes of the left shoulder. No acute osseous pathology. IMPRESSION: 1. No CT evidence of metastatic disease in the chest. 2. Small bilateral pleural effusions with associated  partial compressive atelectasis of the lower lobes. Pneumonia is not excluded. Clinical correlation is recommended. 3. Partially visualized pneumoperitoneum. This may be related to recent surgery. Clinical correlation is recommended. 4. Aortic Atherosclerosis (ICD10-I70.0) and Emphysema (ICD10-J43.9). These results were called by telephone at the time of interpretation on 12/14/2020 at 9:20 pm to the patient's nurse, worth, who verbally acknowledged these results. Electronically Signed   By: Anner Crete M.D.   On: 12/14/2020 21:23    Anti-infectives: Anti-infectives (From admission, onward)    Start     Dose/Rate Route Frequency Ordered Stop   12/04/20 2200  ceFEPIme (MAXIPIME) 2 g in sodium chloride 0.9 % 100 mL IVPB  Status:  Discontinued        2 g 200 mL/hr over 30 Minutes Intravenous Every 8 hours 12/04/20 1327 12/14/20 1352   12/04/20 2200  metroNIDAZOLE (FLAGYL) IVPB 500 mg  Status:  Discontinued        500 mg 100 mL/hr over 60 Minutes Intravenous Every 12 hours 12/04/20 1327 12/14/20 1352   12/02/20 1400  linezolid (ZYVOX) IVPB 600 mg  Status:  Discontinued        600 mg 300 mL/hr over 60 Minutes Intravenous Every 12 hours 12/02/20 1138 12/04/20 1327   11/30/20 1500  vancomycin (VANCOREADY) IVPB 1250 mg/250 mL  Status:  Discontinued        1,250 mg 166.7 mL/hr over 90 Minutes Intravenous Every 12 hours 11/30/20 0606 12/02/20 1138   11/30/20 0600  piperacillin-tazobactam (ZOSYN) IVPB 3.375 g  Status:  Discontinued        3.375 g 12.5 mL/hr over 240 Minutes Intravenous Every 8 hours 11/30/20 0237 12/04/20 1327   11/30/20 0403  piperacillin-tazobactam (ZOSYN) 3.375 GM/50ML IVPB       Note to Pharmacy: Hedy Camara   : cabinet override      11/30/20 0403 12/01/20 0206   11/30/20 0230  clindamycin (CLEOCIN) IVPB 600 mg  Status:  Discontinued        600 mg 100 mL/hr over 30 Minutes Intravenous  Once 11/30/20 0219 11/30/20 0237   11/30/20 0000  ceFEPIme (MAXIPIME) 2 g in sodium  chloride 0.9 % 100 mL IVPB        2 g 200 mL/hr over 30 Minutes Intravenous  Once 11/29/20 2347 11/30/20 0033   11/30/20 0000  vancomycin (VANCOREADY) IVPB 1500 mg/300 mL        1,500 mg 150 mL/hr over 120 Minutes Intravenous  Once 11/29/20 2347 11/30/20 0300   11/29/20 2345  vancomycin (VANCOCIN) IVPB 1000 mg/200 mL premix  Status:  Discontinued        1,000 mg 200 mL/hr over 60 Minutes Intravenous  Once 11/29/20 2340 11/29/20 2347   11/29/20 2345  ceFEPIme (MAXIPIME) 1 g in sodium chloride 0.9 % 100 mL IVPB  Status:  Discontinued        1 g 200 mL/hr over 30 Minutes Intravenous  Once 11/29/20 2340 11/29/20 2347        Assessment/Plan POD16 s/p EUA, rectal wall bx, I&D of perirectal, scrotal, penile and abdominal regions for Fournier's gangrene - Dr. Johney Maine w/ Dr. Alinda Money - 11/30/20 POD15 s/p I&D of perirectal, scrotal, penile and abdominal regions for Fournier's gangrene, rectal stricturing/possible cancer -  Dr. Johney Maine and Dr. Alinda Money - 12/01/20 POD12 s/p EUA w/ I&D for Fournier's gangrene - Dr. Hassell Done and Dr. Alinda Money - 12/04/20 - Wounds clean. No further debridement indicated at this time POD3 s/p lap converted to open diverting loop colostomy Dr. Harlow Asa 12/13/2020   -Continue packing wounds / dressing changes.  Now that he is diverted, can transition to just daily in order to endorse micro debridement of the accumulating fibrinous exudate - Desitin for periwound fungal infection around right perineum incision . Avoid tape (use mesh undergarment). Keep periwound clean and dry - E. Coli, Enterobacter cloacae, Bacteroides Ovatus, Beta Lactamase + -he has had 2 weeks of IV antibiotics and over 7 days since his last surgical debridement.  Okay to stop antibiotics and follow - Foley per urology - Appreciate CCM/TRH assistance  Adenocarcinoma of the rectum Rectal Stricturing - Noted from bx of rectal wall - GI consulted for colonoscopy - recommending waiting until perineal wounds have healed -  S/p fecal diversion with loop colostomy 7/8 - WOC to start colostomy teaching  - CEA 16.7 - Oncology consulted, will need follow up  - Has had CT A/P during admission. CT chest 7/9 shows no evidence of metastatic disease.   FEN - Reg VTE - SCDs, Lovenox ID - Antibiotics completed Foley - in place   Shock - resolved Tobacco use ABL anemia w/ + FOBT - hgb stable  LOS: 16 days    Norm Parcel, [facility] 12/16/2020, 10:55 AM Please see Amion for pager number during day hours 7:00am-4:30pm

## 2020-12-17 ENCOUNTER — Encounter (HOSPITAL_COMMUNITY): Payer: Self-pay

## 2020-12-17 DIAGNOSIS — C20 Malignant neoplasm of rectum: Secondary | ICD-10-CM

## 2020-12-17 DIAGNOSIS — C775 Secondary and unspecified malignant neoplasm of intrapelvic lymph nodes: Secondary | ICD-10-CM

## 2020-12-17 LAB — GLUCOSE, CAPILLARY
Glucose-Capillary: 104 mg/dL — ABNORMAL HIGH (ref 70–99)
Glucose-Capillary: 149 mg/dL — ABNORMAL HIGH (ref 70–99)
Glucose-Capillary: 178 mg/dL — ABNORMAL HIGH (ref 70–99)
Glucose-Capillary: 142 mg/dL — ABNORMAL HIGH (ref 70–99)

## 2020-12-17 LAB — CBC
HCT: 35.1 % — ABNORMAL LOW (ref 39.0–52.0)
Hemoglobin: 10.7 g/dL — ABNORMAL LOW (ref 13.0–17.0)
MCH: 28.7 pg (ref 26.0–34.0)
MCHC: 30.5 g/dL (ref 30.0–36.0)
MCV: 94.1 fL (ref 80.0–100.0)
Platelets: 388 10*3/uL (ref 150–400)
RBC: 3.73 MIL/uL — ABNORMAL LOW (ref 4.22–5.81)
RDW: 17.5 % — ABNORMAL HIGH (ref 11.5–15.5)
WBC: 10.2 10*3/uL (ref 4.0–10.5)
nRBC: 0 % (ref 0.0–0.2)

## 2020-12-17 MED ORDER — POTASSIUM CHLORIDE 20 MEQ PO PACK
40.0000 meq | PACK | Freq: Once | ORAL | Status: AC
  Administered 2020-12-17: 40 meq via ORAL
  Filled 2020-12-17 (×2): qty 2

## 2020-12-17 NOTE — Progress Notes (Signed)
PROGRESS NOTE    James Coffey  XHB:[id number] DOB: [birth year] DOA: 11/29/2020 PCP: Pcp, No   Brief Narrative:  This 50 years old male with PMH significant for tobacco abuse, history of rectal cancer presented in the ED with septic shock secondary to Fournier's gangrene of the perirectal, scrotal, penile and abdominal regions.  Patient was initially admitted in the ICU,  he was requiring IV pressors to maintain blood pressure.  Blood pressure has improved.  He is off pressors now,  he underwent multiple incision and drainage and surgical debridements,  general surgery and urology is following.  Patient is off antibiotics now.  He is started on midodrine for blood pressure control.  He underwent laparoscopy and descending loop colostomy.  Significant hospital events. 6/25 I&D and debridement. Central line placement 6/26 Second I&D 6/28 Hypokalemic overnight, Levophed slightly increased to 29mcg 6/29 third I&D 6/30 remains on levo, great urine output. Received one unit PRBC overnight for hgb 7.1 7/1 PICC line placed and CVC removed overnight, pressor requirement decreased with addition of midodrine D/c'd levophed. 7/8: Diagnostic laparoscopy, open descending loop colostomy 7/9: CT chest without evidence of metastatic disease.    Assessment & Plan:   Principal Problem:   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 Active Problems:   Perirectal abscess   Proctitis   Sepsis ([place])   Tobacco abuse   Partial-English speaking patient - Prefers [place] or [place]   Fourniers gangrene   Stricture of rectum   Malnutrition of moderate degree   Rectal cancer (HCC)  Septic shock secondary to Fournier's gangrene: Patient was hypotensive, tachycardic,  tachypneic with focus of infection on arrival. He was initially admitted in ICU , requiring Levophed support. General surgery was consulted,  He underwent multiple surgical debridements on 6/25, 6/26, 6/29. Continue adequate pain control with oxycodone. Daily  wound assessment, continue wound care No further debridement indicated at this time Culture grow: E. Coli, Enterobacter cloacae, Bacteroides Ovatus, Beta Lactamase + Completed (cefepime and Flagyl)  x 2 weeks.  IV antibiotic discontinued on 12/14/20. Continue midodrine 10 mg 3 times daily Patient is off Levophed.  Sepsis physiology is improving. Shock is resolved. Encourage oral hydration. Continue wound care.  Adenocarcinoma of rectum. It was noted during debridement of Fournier's gangrene. GI following, recommended colonoscopy once perineal wounds are healed. Patient requested clinical staff not to discuss cancer diagnosis with his family members. Avoid rectal tube as per PCCM recommendation.  CT chest completed without metastatic disease. He underwent diagnostic laparoscopy, open descending loop colostomy 12/13/20 , tolerated well. Oncology consulted,  awaiting recommendation. Continue Foley catheter per urology. WOC to start colostomy teaching.  Tobacco use disorder: Nicotine patch.  Normocytic normochromic anemia. Hb remained stable,  continue iron supplementation.  DVT prophylaxis: Lovenox Code Status: Full code. Family Communication: No family at bed side. Disposition Plan:   Status is: Inpatient  Remains inpatient appropriate because:Inpatient level of care appropriate due to severity of illness  Dispo: The patient is from: Home              Anticipated d/c is to: Home with home services              Patient currently is not medically stable to d/c.   Difficult to place patient No  Consultants:  General surgery, urology, PCCM  Procedures: Multiple incision and drainage and debridements  Antimicrobials:   Anti-infectives (From admission, onward)    Start     Dose/Rate Route Frequency Ordered Stop   12/04/20 2200  ceFEPIme (MAXIPIME)  2 g in sodium chloride 0.9 % 100 mL IVPB  Status:  Discontinued        2 g 200 mL/hr over 30 Minutes Intravenous Every 8 hours  12/04/20 1327 12/14/20 1352   12/04/20 2200  metroNIDAZOLE (FLAGYL) IVPB 500 mg  Status:  Discontinued        500 mg 100 mL/hr over 60 Minutes Intravenous Every 12 hours 12/04/20 1327 12/14/20 1352   12/02/20 1400  linezolid (ZYVOX) IVPB 600 mg  Status:  Discontinued        600 mg 300 mL/hr over 60 Minutes Intravenous Every 12 hours 12/02/20 1138 12/04/20 1327   11/30/20 1500  vancomycin (VANCOREADY) IVPB 1250 mg/250 mL  Status:  Discontinued        1,250 mg 166.7 mL/hr over 90 Minutes Intravenous Every 12 hours 11/30/20 0606 12/02/20 1138   11/30/20 0600  piperacillin-tazobactam (ZOSYN) IVPB 3.375 g  Status:  Discontinued        3.375 g 12.5 mL/hr over 240 Minutes Intravenous Every 8 hours 11/30/20 0237 12/04/20 1327   11/30/20 0403  piperacillin-tazobactam (ZOSYN) 3.375 GM/50ML IVPB       Note to Pharmacy: Hedy Camara   : cabinet override      11/30/20 0403 12/01/20 0206   11/30/20 0230  clindamycin (CLEOCIN) IVPB 600 mg  Status:  Discontinued        600 mg 100 mL/hr over 30 Minutes Intravenous  Once 11/30/20 0219 11/30/20 0237   11/30/20 0000  ceFEPIme (MAXIPIME) 2 g in sodium chloride 0.9 % 100 mL IVPB        2 g 200 mL/hr over 30 Minutes Intravenous  Once 11/29/20 2347 11/30/20 0033   11/30/20 0000  vancomycin (VANCOREADY) IVPB 1500 mg/300 mL        1,500 mg 150 mL/hr over 120 Minutes Intravenous  Once 11/29/20 2347 11/30/20 0300   11/29/20 2345  vancomycin (VANCOCIN) IVPB 1000 mg/200 mL premix  Status:  Discontinued        1,000 mg 200 mL/hr over 60 Minutes Intravenous  Once 11/29/20 2340 11/29/20 2347   11/29/20 2345  ceFEPIme (MAXIPIME) 1 g in sodium chloride 0.9 % 100 mL IVPB  Status:  Discontinued        1 g 200 mL/hr over 30 Minutes Intravenous  Once 11/29/20 2340 11/29/20 2347        Subjective: Patient was seen and examined at bedside.  Overnight events noted.  Blood pressure has improved.  Wounds are healing better. Brownish stool noted in colostomy bag. He  reports feeling better, pain is controlled, he has participated in physical therapy, hopeful to be discharged soon.   Objective: Vitals:   12/16/20 0557 12/16/20 1447 12/16/20 2159 12/17/20 0518  BP: 104/69 106/63 95/64 110/74  Pulse: 74 64 68 71  Resp: 18 16 16 18   Temp: 98.8 F (37.1 C) 98.5 F (36.9 C) 98.9 F (37.2 C) 98.6 F (37 C)  TempSrc: Oral Oral Oral Oral  SpO2: 97% 99% 98% 100%  Weight:      Height:        Intake/Output Summary (Last 24 hours) at 12/17/2020 1215 Last data filed at 12/17/2020 0923 Gross per 24 hour  Intake 1020 ml  Output 900 ml  Net 120 ml   Filed Weights   11/30/20 0800 12/10/20 1703 12/13/20 0920  Weight: 75.1 kg 64 kg 64 kg    Examination:  General exam: Appears calm and comfortable , not in any acute distress.  Respiratory system: Clear to auscultation. Respiratory effort normal. Cardiovascular system: S1 & S2 heard, RRR. No JVD, murmurs, rubs, gallops or clicks. No pedal edema. Gastrointestinal system: Abdomen is nondistended, soft and mildly tender, no organomegaly or masses felt.  Normal bowel sounds heard. Colostomy bag , brownish stools noted. midline surgical scar. Multiple abdominal wounds are healing. Central nervous system: Alert and oriented. No focal neurological deficits. Extremities: Symmetric 5 x 5 power.  No edema, no cyanosis, no clubbing. Skin: No rashes, lesions or ulcers Psychiatry: Judgement and insight appear normal. Mood & affect appropriate.     Data Reviewed: I have personally reviewed following labs and imaging studies  CBC: Recent Labs  Lab 12/11/20 0314 12/12/20 0432 12/13/20 0416 12/14/20 0304 12/15/20 0350 12/17/20 0430  WBC 13.2* 13.1* 10.8* 12.9* 10.9* 10.2  NEUTROABS 11.0*  --   --   --   --   --   HGB 9.5* 9.7* 9.9* 10.3* 10.8* 10.7*  HCT 31.4* 32.2* 33.5* 33.5* 35.6* 35.1*  MCV 95.2 95.5 97.1 94.1 95.2 94.1  PLT 513* 458* 478* 488* 445* 001   Basic Metabolic Panel: Recent Labs  Lab  12/11/20 0314 12/12/20 0432 12/13/20 0416 12/14/20 0304 12/15/20 0350 12/16/20 0300  NA 140 140 139 139 140 139  K 3.9 4.1 4.0 3.8 3.4* 3.4*  CL 106 108 107 103 105 103  CO2 26 26 27 30 28 27   GLUCOSE 121* 135* 128* 130* 103* 110*  BUN 15 17 16 15 13 10   CREATININE 0.50* 0.58* 0.50* 0.58* 0.46* 0.50*  CALCIUM 8.9 8.9 8.9 9.1 8.8* 8.8*  MG 2.6* 2.2 2.2 2.2  --   --   PHOS  --  3.2 3.3 3.8  --   --    GFR: Estimated Creatinine Clearance: 98.9 mL/min (A) (by C-G formula based on SCr of 0.5 mg/dL (L)). Liver Function Tests: Recent Labs  Lab 12/12/20 0432 12/14/20 0304  AST 14* 15  ALT 16 16  ALKPHOS 62 59  BILITOT 0.6 0.8  PROT 6.2* 6.5  ALBUMIN 2.7* 2.8*   No results for input(s): LIPASE, AMYLASE in the last 168 hours. No results for input(s): AMMONIA in the last 168 hours. Coagulation Profile: No results for input(s): INR, PROTIME in the last 168 hours. Cardiac Enzymes: No results for input(s): CKTOTAL, CKMB, CKMBINDEX, TROPONINI in the last 168 hours. BNP (last 3 results) No results for input(s): PROBNP in the last 8760 hours. HbA1C: No results for input(s): HGBA1C in the last 72 hours. CBG: Recent Labs  Lab 12/16/20 1115 12/16/20 1728 12/16/20 2202 12/17/20 0722 12/17/20 1207  GLUCAP 155* 138* 137* 104* 149*   Lipid Profile: No results for input(s): CHOL, HDL, LDLCALC, TRIG, CHOLHDL, LDLDIRECT in the last 72 hours. Thyroid Function Tests: No results for input(s): TSH, T4TOTAL, FREET4, T3FREE, THYROIDAB in the last 72 hours. Anemia Panel: No results for input(s): VITAMINB12, FOLATE, FERRITIN, TIBC, IRON, RETICCTPCT in the last 72 hours. Sepsis Labs: No results for input(s): PROCALCITON, LATICACIDVEN in the last 168 hours.  Recent Results (from the past 240 hour(s))  MRSA Next Gen by PCR, Nasal     Status: None   Collection Time: 12/08/20  8:38 AM   Specimen: Nasal Mucosa; Nasal Swab  Result Value Ref Range Status   MRSA by PCR Next Gen NOT DETECTED NOT  DETECTED Final    Comment: (NOTE) The GeneXpert MRSA Assay (FDA approved for NASAL specimens only), is one component of a comprehensive MRSA colonization surveillance program. It is  not intended to diagnose MRSA infection nor to guide or monitor treatment for MRSA infections. Test performance is not FDA approved in patients less than 47 years old. Performed at [facility], [address], [place], [place] [address]     Radiology Studies: No results found.  Scheduled Meds:  (feeding supplement) PROSource Plus  30 mL Oral BID BM   acetaminophen  1,000 mg Oral Q6H   vitamin C  500 mg Oral BID   bacitracin   Topical BID   Chlorhexidine Gluconate Cloth  6 each Topical Daily   cholestyramine light  4 g Oral QID   enoxaparin (LOVENOX) injection  40 mg Subcutaneous Daily   feeding supplement  237 mL Oral BID BM   ferrous sulfate  325 mg Oral Q breakfast   hydrocortisone  20 mg Oral Daily   Followed by   Derrill Memo ON 12/20/2020] hydrocortisone  10 mg Oral Daily   Followed by   Derrill Memo ON 12/23/2020] hydrocortisone  5 mg Oral Daily   ibuprofen  600 mg Oral TID   insulin aspart  0-9 Units Subcutaneous TID WC   lip balm  1 application Topical BID   liver oil-zinc oxide   Topical BID   mouth rinse  15 mL Mouth Rinse BID   methocarbamol  500 mg Oral TID   midodrine  10 mg Oral TID WC   multivitamin with minerals  1 tablet Oral Daily   nicotine  21 mg Transdermal Daily   nutrition supplement (JUVEN)  1 packet Oral BID BM   pantoprazole  40 mg Oral QHS   sodium chloride flush  10-40 mL Intracatheter Q12H   zinc sulfate  220 mg Oral Daily   Continuous Infusions:   LOS: 17 days    Time spent: 25 mins  Shawna Clamp, MD [facility]   If 7PM-7AM, please contact night-coverage

## 2020-12-17 NOTE — Progress Notes (Signed)
Nutrition Follow-up  DOCUMENTATION CODES:   Non-severe (moderate) malnutrition in context of chronic illness  INTERVENTION:   -Ensure Enlive po BID, each supplement provides 350 kcal and 20 grams of protein  -Juven Fruit Punch BID, each serving provides 95kcal and 2.5g of protein (amino acids glutamine and arginine)  -Prosource Plus PO BID, each provides 100 kcals and 15g protein  -Continue Vitamin C, daily MVI  -D/c Zinc sulfate dose after today. Has received 2 weeks of 220 mg zinc sulfate daily. Providing for more than 2 weeks increases the risk of copper deficiency.  NUTRITION DIAGNOSIS:   Moderate Malnutrition related to chronic illness as evidenced by mild fat depletion, mild muscle depletion, moderate muscle depletion.  Ongoing.  GOAL:   Patient will meet greater than or equal to 90% of their needs  Improving.   MONITOR:   PO intake, Supplement acceptance, Labs, Weight trends, Skin  ASSESSMENT:   51 year-old male with no known medical history. He presented to the hospital ~1 month ago and was noted to have a spontaneously draining perirectal abscess. He presented to the ED on 6/25 d/t worsening pain, chills, increased urinary frequency, and irregular bowel urgency. He was dx with Fournier's gangrene/necrotizing fasciitis.  Significant Events: 6/25- admission; EUA, rectal wall biopsy, I&D of perirectal, scrotal, penile, and abdominal regions d/t Fournier's gangrene. 6/26- I&D of perirectal, scrotal, penile, and abdominal regions d/t Fournier's gangrene and rectal stricturing/possible cancer. 6/29- I&D and repacking  7/8 - s/p dx lap, open descending loop colostomy  Patient currently consuming 0-30% of meals. Pt had ordered breakfast this morning. Pt consuming all of his protein supplements. Will continue vitamin supplementation as well but will d/c zinc sulfate dose after today to prevent copper deficiency.  Admission weight: 165 lbs Last recorded weight 7/8: 141  lbs.  I/Os: -15.6L since 6/28 UOP: 500 ml x 24 hrs Colostomy: 425 ml x 24 hrs  Medications: Vitamin C, Ferrous sulfate, Multivitamin with minerals daily, KLOR-CON, Zinc sulfate  Labs reviewed: CBGs: 101-155 Low K  Diet Order:   Diet Order             DIET SOFT Room service appropriate? Yes; Fluid consistency: Thin  Diet effective now                   EDUCATION NEEDS:   Education needs have been addressed  Skin:  Skin Assessment: Skin Integrity Issues: Skin Integrity Issues:: Incisions, Other (Comment) Incisions: abdomen (6/25, 6/26, 6/29) and perineum (6/25, 6/26) Other: MASD to bilateral buttocks (newly documented on 6/30)  Last BM:  7/12 -colostomy  Height:   Ht Readings from Last 1 Encounters:  12/13/20 6\' 1"  (1.854 m)    Weight:   Wt Readings from Last 1 Encounters:  12/13/20 64 kg    Ideal Body Weight:  80.9 kg  BMI:  Body mass index is 18.62 kg/m.  Estimated Nutritional Needs:   Kcal:  2550-2750 kcal  Protein:  145-160 grams  Fluid:  >/= 2.8 L/day   Clayton Bibles, MS, RD, LDN Inpatient Clinical Dietitian Contact information available via Amion

## 2020-12-17 NOTE — Consult Note (Addendum)
[place]  [phone] [phone]   [place]  Referral MD: Dr. 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  He presented to the emergency department with septic shock secondary to Fournier's gangrene of the perirectal, scrotal, penile, and abdominal regions.  He was initially admitted to the ICU requiring IV pressors.  He underwent multiple I&D's and surgical debridements.  CT abdomen/pelvis with contrast was performed on admission which showed worsening perineal inflammation, including right perirectal with significant progression of soft tissue gas now extending anteriorly into the perineum, base of penis, both scrotal sacs, and anterior abdominal wall-findings suspicious for necrotizing soft tissue infection, irregular presacral nodule measuring 2.2 cm which could represent an enlarged lymph node but is nonspecific.  He was taken to the OR on 6/25 for I&D.  He was found to have a rectal stricture and biopsies were taken of his left lateral and right lateral rectal wall for biopsy.  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  A CEA was obtained on 12/09/2020 which was elevated at 16.7.  He was again taken to the OR on 12/13/2020 for diagnostic laparoscopic he and open descending loop colostomy.  CT of the chest with contrast was performed on 12/14/2020 which showed no evidence of metastatic disease in the chest.  GI has been consulted for colonoscopy but recommend waiting until perineal wounds have healed.  I met with the patient in his hospital room. He declined use of translator.  The patient reports that he is feeling much better.  He is anxious to be discharged to home.  The patient reports that his appetite has been good and he is not sure that he is losing any weight recently.  He denies headaches,  dizziness, chest pain, shortness of breath, abdominal pain, nausea, vomiting, constipation, diarrhea.  His colostomy is functioning well.  He has not noticed any hematochezia or melena.  The patient is married and has 3 children.  Denies recent alcohol use.  He currently smokes a pack of cigarettes per day.  Denies family history of malignancy.  Medical oncology was asked see the patient for recommendations regarding his newly diagnosed rectal adenocarcinoma   Past Medical History:  Diagnosis Date   Tobacco abuse   :   Past Surgical History:  Procedure Laterality Date   HERNIA REPAIR     INCISION AND DRAINAGE ABSCESS N/A 12/01/2020   Procedure: INCISION AND DRAINAGE ABSCESS;  Surgeon: Michael Boston, MD;  Location: [facility];  Service: General;  Laterality: N/A;   INCISION AND DRAINAGE ABSCESS N/A 12/04/2020   Procedure: INCISION AND DRAINAGE FOURNIERS GANGRENE;  Surgeon: [name], MD;  Location: [facility];  Service: General;  Laterality: N/A;   INCISION AND DRAINAGE PERIRECTAL ABSCESS N/A 11/30/2020   Procedure: INCISION AND DEBRIDEMENT OF PERIRECTAL ABSCESS AND RECTAL WALL BIOPSIES;  Surgeon: Michael Boston, MD;  Location: [facility];  Service: General;  Laterality: N/A;   INGUINAL HERNIA REPAIR Right 2007   LAPAROSCOPIC LOOP COLOSTOMY N/A 12/13/2020   Procedure: DIAGNOSTIC LAPAROSCOPY WITH  OPEN DESCENDING LOOP COLOSTOMY;  Surgeon: Armandina Gemma, MD;  Location: [facility];  Service: General;  Laterality: N/A;  :   Current Facility-Administered Medications  Medication Dose Route Frequency Provider Last Rate Last Admin   (feeding supplement) PROSource Plus liquid 30 mL  30 mL Oral BID BM Michael Boston, MD  30 mL at 12/17/20 0917   acetaminophen (TYLENOL) tablet 1,000 mg  1,000 mg Oral Lajuana Ripple, MD   1,000 mg at 12/17/20 6440   ascorbic acid (VITAMIN C) tablet 500 mg  500 mg Oral BID Michael Boston, MD   500 mg at 12/17/20 3474   bacitracin ointment   Topical BID Michael Boston, MD   Given at  12/17/20 [phone]   Chlorhexidine Gluconate Cloth 2 % PADS 6 each  6 each Topical Daily Michael Boston, MD   6 each at 12/17/20 6387   cholestyramine light (PREVALITE) packet 4 g  4 g Oral QID Michael Boston, MD   4 g at 12/17/20 1051   enoxaparin (LOVENOX) injection 40 mg  40 mg Subcutaneous Daily Michael Boston, MD   40 mg at 12/17/20 0917   feeding supplement (ENSURE ENLIVE / ENSURE PLUS) liquid 237 mL  237 mL Oral BID BM Michael Boston, MD   237 mL at 12/17/20 0918   fentaNYL (SUBLIMAZE) injection 25-50 mcg  25-50 mcg Intravenous Q2H PRN Michael Boston, MD   50 mcg at 12/16/20 0437   ferrous sulfate tablet 325 mg  325 mg Oral Q breakfast Michael Boston, MD   325 mg at 12/17/20 5643   hydrocortisone (CORTEF) tablet 20 mg  20 mg Oral Daily Michael Boston, MD   20 mg at 12/17/20 1051   Followed by   Derrill Memo ON 12/20/2020] hydrocortisone (CORTEF) tablet 10 mg  10 mg Oral Daily Michael Boston, MD       Followed by   Derrill Memo ON 12/23/2020] hydrocortisone (CORTEF) tablet 5 mg  5 mg Oral Daily Michael Boston, MD       ibuprofen (ADVIL) tablet 600 mg  600 mg Oral TID Barkley Boards R, PA-C   600 mg at 12/17/20 3295   insulin aspart (novoLOG) injection 0-9 Units  0-9 Units Subcutaneous TID WC Michael Boston, MD   1 Units at 12/16/20 1729   lip balm (CARMEX) ointment 1 application  1 application Topical BID Michael Boston, MD   1 application at 18/84/16 0919   liver oil-zinc oxide (DESITIN) 40 % ointment   Topical BID Michael Boston, MD   Given at 12/17/20 0919   magic mouthwash  15 mL Oral QID PRN Michael Boston, MD       MEDLINE mouth rinse  15 mL Mouth Rinse BID Michael Boston, MD   15 mL at 12/17/20 0919   methocarbamol (ROBAXIN) tablet 500 mg  500 mg Oral TID Norm Parcel, PA-C   500 mg at 12/17/20 6063   metoprolol tartrate (LOPRESSOR) injection 5 mg  5 mg Intravenous Q6H PRN Michael Boston, MD       midodrine (PROAMATINE) tablet 10 mg  10 mg Oral TID WC Michael Boston, MD   10 mg at 12/17/20 0160   multivitamin  with minerals tablet 1 tablet  1 tablet Oral Daily Michael Boston, MD   1 tablet at 12/17/20 1093   nicotine (NICODERM CQ - dosed in mg/24 hours) patch 21 mg  21 mg Transdermal Daily Michael Boston, MD   21 mg at 12/17/20 2355   nutrition supplement (JUVEN) (JUVEN) powder packet 1 packet  1 packet Oral BID BM Michael Boston, MD   1 packet at 12/16/20 2058   ondansetron (ZOFRAN-ODT) disintegrating tablet 4 mg  4 mg Oral Q6H PRN Michael Boston, MD   4 mg at 12/13/20 1437   Or   ondansetron (ZOFRAN) injection 4 mg  4 mg Intravenous  Q6H PRN Michael Boston, MD       oxyCODONE (Oxy IR/ROXICODONE) immediate release tablet 5-10 mg  5-10 mg Oral Q4H PRN Michael Boston, MD   5 mg at 12/17/20 0917   pantoprazole (PROTONIX) EC tablet 40 mg  40 mg Oral Ardeen Fillers, MD   40 mg at 12/16/20 2058   simethicone (MYLICON) chewable tablet 40 mg  40 mg Oral Q6H PRN Michael Boston, MD   40 mg at 12/12/20 1236   sodium chloride flush (NS) 0.9 % injection 10-40 mL  10-40 mL Intracatheter Gorden Harms, MD   10 mL at 12/17/20 0919   sodium chloride flush (NS) 0.9 % injection 10-40 mL  10-40 mL Intracatheter PRN Michael Boston, MD       zinc sulfate capsule 220 mg  220 mg Oral Daily Shawna Clamp, MD   220 mg at 12/16/20 1317     No Known Allergies:   Family History  Problem Relation Age of Onset   CAD Neg Hx    Inflammatory bowel disease Neg Hx   :   Social History   Socioeconomic History   Marital status: Married    Spouse name: Not on file   Number of children: Not on file   Years of education: Not on file   Highest education level: Not on file  Occupational History   Occupation: truck driver  Tobacco Use   Smoking status: Every Day    Packs/day: 1.00    Pack years: 0.00    Types: Cigarettes   Smokeless tobacco: Never  Vaping Use   Vaping Use: Never used  Substance and Sexual Activity   Alcohol use: No   Drug use: No   Sexual activity: Not on file  Other Topics Concern   Not on file   Social History Narrative   From [place].  Came to the [place] in 2000.   Social Determinants of Health   Financial Resource Strain: Not on file  Food Insecurity: Not on file  Transportation Needs: Not on file  Physical Activity: Not on file  Stress: Not on file  Social Connections: Not on file  Intimate Partner Violence: Not on file  :  Review of Systems: A comprehensive 14 point review of systems was negative except as noted in the HPI.  Exam: Patient Vitals for the past 24 hrs:  BP Temp Temp src Pulse Resp SpO2  12/17/20 0518 110/74 98.6 F (37 C) Oral 71 18 100 %  12/16/20 2159 95/64 98.9 F (37.2 C) Oral 68 16 98 %  12/16/20 1447 106/63 98.5 F (36.9 C) Oral 64 16 99 %    General:  well-nourished in no acute distress.   Eyes:  no scleral icterus.   ENT:  There were no oropharyngeal lesions.     Lymphatics:  Negative cervical, supraclavicular or axillary adenopathy.   Respiratory: lungs were clear bilaterally without wheezing or crackles.   Cardiovascular:  Regular rate and rhythm, S1/S2, without murmur, rub or gallop.  There was no pedal edema.   GI: Positive bowel sounds, soft, nontender, staples present without erythema or drainage. Musculoskeletal: Strength symmetrical in the upper and lower extremities. Skin exam was without echymosis, petichae.   Neuro exam was nonfocal. Patient was alert and oriented.  Attention was good.   Language was appropriate.  Mood was normal without depression.  Speech was not pressured.  Thought content was not tangential.     Lab Results  Component Value Date   WBC  10.2 12/17/2020   HGB 10.7 (L) 12/17/2020   HCT 35.1 (L) 12/17/2020   PLT 388 12/17/2020   GLUCOSE 110 (H) 12/16/2020   ALT 16 12/14/2020   AST 15 12/14/2020   NA 139 12/16/2020   K 3.4 (L) 12/16/2020   CL 103 12/16/2020   CREATININE 0.50 (L) 12/16/2020   BUN 10 12/16/2020   CO2 27 12/16/2020    CT CHEST W CONTRAST  Result Date: 12/14/2020 CLINICAL DATA:  50 year old  male with colorectal cancer staging. EXAM: CT CHEST WITH CONTRAST TECHNIQUE: Multidetector CT imaging of the chest was performed during intravenous contrast administration. CONTRAST:  31m OMNIPAQUE IOHEXOL 300 MG/ML  SOLN COMPARISON:  Chest radiograph dated 11/30/2020. CT abdomen pelvis dated 11/30/2020. FINDINGS: Evaluation of this exam is limited due to respiratory motion artifact. Cardiovascular: Borderline cardiomegaly. Three vessel coronary vascular calcification. No pericardial effusion. Mild atherosclerotic calcification of the thoracic aorta. No aneurysmal dilatation or dissection. The origins of the great vessels of the aortic arch appear patent as visualized. The central pulmonary arteries are grossly unremarkable for the degree of opacification. Right-sided PICC with tip in the right atrium. Mediastinum/Nodes: No hilar or mediastinal adenopathy. The esophagus and thyroid gland are grossly unremarkable. No mediastinal fluid collection. Lungs/Pleura: Small bilateral pleural effusions with associated partial compressive atelectasis of the lower lobes. Pneumonia is not excluded clinical correlation is recommended. Mild paraseptal emphysema. There is no pneumothorax. Mucus secretions noted in the right mainstem bronchus. The airways remain patent. Upper Abdomen: Partially visualized pneumoperitoneum. In the absence of recent procedure findings concerning for bowel perforation. Clinical correlation is recommended. CT of the abdomen pelvis may provide better evaluation if clinically indicated. Musculoskeletal: Severe degenerative changes of the left shoulder. No acute osseous pathology. IMPRESSION: 1. No CT evidence of metastatic disease in the chest. 2. Small bilateral pleural effusions with associated partial compressive atelectasis of the lower lobes. Pneumonia is not excluded. Clinical correlation is recommended. 3. Partially visualized pneumoperitoneum. This may be related to recent surgery. Clinical  correlation is recommended. 4. Aortic Atherosclerosis (ICD10-I70.0) and Emphysema (ICD10-J43.9). These results were called by telephone at the time of interpretation on 12/14/2020 at 9:20 pm to the patient's nurse, worth, who verbally acknowledged these results. Electronically Signed   By: AAnner CreteM.D.   On: 12/14/2020 21:23   CT ABDOMEN PELVIS W CONTRAST  Result Date: 11/30/2020 CLINICAL DATA:  Lower abdominal pain.  Recent perianal abscess. EXAM: CT ABDOMEN AND PELVIS WITH CONTRAST TECHNIQUE: Multidetector CT imaging of the abdomen and pelvis was performed using the standard protocol following bolus administration of intravenous contrast. CONTRAST:  1059mOMNIPAQUE IOHEXOL 300 MG/ML  SOLN COMPARISON:  Abdominopelvic CT 11/03/2020 FINDINGS: Lower chest: No acute airspace disease or pleural effusion. Heart is normal in size. Hepatobiliary: Borderline hepatic steatosis. No focal liver lesion. Gallbladder physiologically distended, no calcified stone. No biliary dilatation. Pancreas: No ductal dilatation or inflammation. Mild motion artifact through the pancreas. Spleen: Normal in size without focal abnormality. Adrenals/Urinary Tract: Minimal left adrenal thickening. No dominant adrenal nodule. No hydronephrosis or perinephric edema. Homogeneous renal enhancement with symmetric excretion on delayed phase imaging. Small cyst in the medial right kidney is unchanged. Urinary bladder is physiologically distended without wall thickening. Stomach/Bowel: Inflammation and soft tissue air, no drainable collection mild rectal wall thickening. No additional sites of colonic wall thickening. Moderate colonic stool burden. Appendix not definitively visualized, no evidence of appendicitis. Decompressed small bowel without inflammation. Unremarkable stomach. Vascular/Lymphatic: Moderate aortic atherosclerosis with calcified noncalcified atheromatous plaque. Patent portal  vein. Patent splenic veins. There is an irregular  presacral nodule measuring 2.2 cm, series 2, image 74, this may represent an enlarged lymph node but is nonspecific. There prominent bilateral inguinal lymph nodes. Reproductive: Peroneal air previously localized to the right buttock and peritoneal and has spread anteriorly into the base of the penis and both scrotum. Patchy air seen within both scrotal sacs, right greater than left. Minimal air tracks adjacent to the left aspect of the prostate. Other: Air in soft tissue inflammation about the right buttock has progressed, with air-fluid collection in the right perianal region. Patchy soft tissue gas extends anteriorly into the perineum into both scrotum, base of the penis, and anterior abdominal wall. Few foci of soft tissue gas track adjacent to the prostate gland. No frank pneumoperitoneum or ascites. Musculoskeletal: There are no acute or suspicious osseous abnormalities. Occasional bone islands in the pelvis. IMPRESSION: 1. Worsening perineal inflammation, including right peri-rectal. Significant progression of soft tissue gas, now extending anteriorly into the perineum, base of the penis, both scrotal sacs and anterior abdominal wall. Findings are highly suspicious for necrotizing soft tissue infection, and urgent surgical consult is recommended. 2. Irregular presacral nodule measuring 2.2 cm may represent an enlarged lymph node but is nonspecific. Recommend follow-up after course of treatment. There is rectal wall thickening which may be inflammatory, however neoplasm is not excluded. Aortic Atherosclerosis (ICD10-I70.0). These results were called by telephone at the time of interpretation on 11/30/2020 at 2:01 am to provider Lattie Haw, who will convery the results urgently to [facility] , who verbally acknowledged these results. Electronically Signed   By: Keith Rake M.D.   On: 11/30/2020 02:02   DG CHEST PORT 1 VIEW  Result Date: 11/30/2020 CLINICAL DATA:  Right central line placement EXAM: PORTABLE  CHEST 1 VIEW COMPARISON:  March 25, 2007 FINDINGS: A right central line is been placed in the interval with the distal tip in the central SVC. No pneumothorax. The lower chest is not completely included on today's film, particularly on the left. Mild increased haziness in the left upper lobe. No other acute abnormalities. IMPRESSION: 1. A right central line has been placed in the interval with the distal tip in the SVC. No pneumothorax. 2. Limited study as the lung bases were not completely included and the patient is rotated. There is mild haziness in the left upper lung which could be due to positioning or subtle infiltrate. A better positioned PA and lateral chest x-ray could better evaluate. Electronically Signed   By: Dorise Bullion III M.D   On: 11/30/2020 09:07   ECHOCARDIOGRAM COMPLETE  Result Date: 12/06/2020    ECHOCARDIOGRAM REPORT   Patient Name:   James Coffey Date of Exam: 12/06/2020 Medical Rec #:  [id number]    Height:       72.0 in Accession #:    [id number]   Weight:       165.6 lb Date of Birth:  Jun 05, 1971    BSA:          1.966 m Patient Age:    57 years     BP:           107/64 mmHg Patient Gender: M            HR:           102 bpm. Exam Location:  Inpatient Procedure: 2D Echo, Cardiac Doppler and Color Doppler Indications:    I42.9 Cardiomyopathy (unspecified)  History:  Patient has no prior history of Echocardiogram examinations.  Sonographer:    Jonelle Sidle Dance Referring Phys: [id number] [place]  1. Left ventricular ejection fraction, by estimation, is 55 to 60%. The left ventricle has normal function. The left ventricle has no regional wall motion abnormalities. Left ventricular diastolic parameters were normal.  2. Right ventricular systolic function is normal. The right ventricular size is normal.  3. The mitral valve is grossly normal. Trivial mitral valve regurgitation.  4. The aortic valve is grossly normal. Aortic valve regurgitation is not visualized. FINDINGS   Left Ventricle: Left ventricular ejection fraction, by estimation, is 55 to 60%. The left ventricle has normal function. The left ventricle has no regional wall motion abnormalities. The left ventricular internal cavity size was normal in size. There is  no left ventricular hypertrophy. Left ventricular diastolic parameters were normal. Right Ventricle: The right ventricular size is normal. Right vetricular wall thickness was not well visualized. Right ventricular systolic function is normal. Left Atrium: Left atrial size was normal in size. Right Atrium: Right atrial size was normal in size. Pericardium: There is no evidence of pericardial effusion. Mitral Valve: The mitral valve is grossly normal. Trivial mitral valve regurgitation. Tricuspid Valve: The tricuspid valve is normal in structure. Tricuspid valve regurgitation is trivial. Aortic Valve: The aortic valve is grossly normal. Aortic valve regurgitation is not visualized. Pulmonic Valve: The pulmonic valve was normal in structure. Pulmonic valve regurgitation is not visualized. Aorta: The aortic root and ascending aorta are structurally normal, with no evidence of dilitation. IAS/Shunts: The atrial septum is grossly normal.  LEFT VENTRICLE PLAX 2D LVIDd:         5.00 cm  Diastology LVIDs:         3.70 cm  LV e' medial:    10.60 cm/s LV PW:         1.00 cm  LV E/e' medial:  6.5 LV IVS:        1.10 cm  LV e' lateral:   16.40 cm/s LVOT diam:     2.00 cm  LV E/e' lateral: 4.2 LV SV:         58 LV SV Index:   29 LVOT Area:     3.14 cm  RIGHT VENTRICLE             IVC RV Basal diam:  3.20 cm     IVC diam: 1.70 cm RV Mid diam:    2.10 cm RV S prime:     14.10 cm/s TAPSE (M-mode): 2.3 cm LEFT ATRIUM             Index       RIGHT ATRIUM           Index LA diam:        3.50 cm 1.78 cm/m  RA Area:     15.00 cm LA Vol (A2C):   64.5 ml 32.81 ml/m RA Volume:   37.90 ml  19.28 ml/m LA Vol (A4C):   42.9 ml 21.82 ml/m LA Biplane Vol: 53.1 ml 27.01 ml/m  AORTIC VALVE  LVOT Vmax:   125.00 cm/s LVOT Vmean:  75.900 cm/s LVOT VTI:    0.184 m  AORTA Ao Root diam: 3.00 cm Ao Asc diam:  3.20 cm MITRAL VALVE MV Area (PHT): 4.68 cm    SHUNTS MV Decel Time: 162 msec    Systemic VTI:  0.18 m MV E velocity: 69.20 cm/s  Systemic Diam: 2.00 cm MV A velocity: 58.10  cm/s MV E/A ratio:  1.19 Mertie Moores MD Electronically signed by Mertie Moores MD Signature Date/Time: 12/06/2020/2:58:19 PM    Final    [place] EKG SITE RITE  Result Date: 12/05/2020 If Site Rite image not attached, placement could not be confirmed due to current cardiac rhythm.    CT CHEST W CONTRAST  Result Date: 12/14/2020 CLINICAL DATA:  50 year old male with colorectal cancer staging. EXAM: CT CHEST WITH CONTRAST TECHNIQUE: Multidetector CT imaging of the chest was performed during intravenous contrast administration. CONTRAST:  90m OMNIPAQUE IOHEXOL 300 MG/ML  SOLN COMPARISON:  Chest radiograph dated 11/30/2020. CT abdomen pelvis dated 11/30/2020. FINDINGS: Evaluation of this exam is limited due to respiratory motion artifact. Cardiovascular: Borderline cardiomegaly. Three vessel coronary vascular calcification. No pericardial effusion. Mild atherosclerotic calcification of the thoracic aorta. No aneurysmal dilatation or dissection. The origins of the great vessels of the aortic arch appear patent as visualized. The central pulmonary arteries are grossly unremarkable for the degree of opacification. Right-sided PICC with tip in the right atrium. Mediastinum/Nodes: No hilar or mediastinal adenopathy. The esophagus and thyroid gland are grossly unremarkable. No mediastinal fluid collection. Lungs/Pleura: Small bilateral pleural effusions with associated partial compressive atelectasis of the lower lobes. Pneumonia is not excluded clinical correlation is recommended. Mild paraseptal emphysema. There is no pneumothorax. Mucus secretions noted in the right mainstem bronchus. The airways remain patent. Upper Abdomen: Partially  visualized pneumoperitoneum. In the absence of recent procedure findings concerning for bowel perforation. Clinical correlation is recommended. CT of the abdomen pelvis may provide better evaluation if clinically indicated. Musculoskeletal: Severe degenerative changes of the left shoulder. No acute osseous pathology. IMPRESSION: 1. No CT evidence of metastatic disease in the chest. 2. Small bilateral pleural effusions with associated partial compressive atelectasis of the lower lobes. Pneumonia is not excluded. Clinical correlation is recommended. 3. Partially visualized pneumoperitoneum. This may be related to recent surgery. Clinical correlation is recommended. 4. Aortic Atherosclerosis (ICD10-I70.0) and Emphysema (ICD10-J43.9). These results were called by telephone at the time of interpretation on 12/14/2020 at 9:20 pm to the patient's nurse, worth, who verbally acknowledged these results. Electronically Signed   By: AAnner CreteM.D.   On: 12/14/2020 21:23   CT ABDOMEN PELVIS W CONTRAST  Result Date: 11/30/2020 CLINICAL DATA:  Lower abdominal pain.  Recent perianal abscess. EXAM: CT ABDOMEN AND PELVIS WITH CONTRAST TECHNIQUE: Multidetector CT imaging of the abdomen and pelvis was performed using the standard protocol following bolus administration of intravenous contrast. CONTRAST:  1052mOMNIPAQUE IOHEXOL 300 MG/ML  SOLN COMPARISON:  Abdominopelvic CT 11/03/2020 FINDINGS: Lower chest: No acute airspace disease or pleural effusion. Heart is normal in size. Hepatobiliary: Borderline hepatic steatosis. No focal liver lesion. Gallbladder physiologically distended, no calcified stone. No biliary dilatation. Pancreas: No ductal dilatation or inflammation. Mild motion artifact through the pancreas. Spleen: Normal in size without focal abnormality. Adrenals/Urinary Tract: Minimal left adrenal thickening. No dominant adrenal nodule. No hydronephrosis or perinephric edema. Homogeneous renal enhancement with  symmetric excretion on delayed phase imaging. Small cyst in the medial right kidney is unchanged. Urinary bladder is physiologically distended without wall thickening. Stomach/Bowel: Inflammation and soft tissue air, no drainable collection mild rectal wall thickening. No additional sites of colonic wall thickening. Moderate colonic stool burden. Appendix not definitively visualized, no evidence of appendicitis. Decompressed small bowel without inflammation. Unremarkable stomach. Vascular/Lymphatic: Moderate aortic atherosclerosis with calcified noncalcified atheromatous plaque. Patent portal vein. Patent splenic veins. There is an irregular presacral nodule measuring 2.2 cm, series 2, image 74,  this may represent an enlarged lymph node but is nonspecific. There prominent bilateral inguinal lymph nodes. Reproductive: Peroneal air previously localized to the right buttock and peritoneal and has spread anteriorly into the base of the penis and both scrotum. Patchy air seen within both scrotal sacs, right greater than left. Minimal air tracks adjacent to the left aspect of the prostate. Other: Air in soft tissue inflammation about the right buttock has progressed, with air-fluid collection in the right perianal region. Patchy soft tissue gas extends anteriorly into the perineum into both scrotum, base of the penis, and anterior abdominal wall. Few foci of soft tissue gas track adjacent to the prostate gland. No frank pneumoperitoneum or ascites. Musculoskeletal: There are no acute or suspicious osseous abnormalities. Occasional bone islands in the pelvis. IMPRESSION: 1. Worsening perineal inflammation, including right peri-rectal. Significant progression of soft tissue gas, now extending anteriorly into the perineum, base of the penis, both scrotal sacs and anterior abdominal wall. Findings are highly suspicious for necrotizing soft tissue infection, and urgent surgical consult is recommended. 2. Irregular presacral  nodule measuring 2.2 cm may represent an enlarged lymph node but is nonspecific. Recommend follow-up after course of treatment. There is rectal wall thickening which may be inflammatory, however neoplasm is not excluded. Aortic Atherosclerosis (ICD10-I70.0). These results were called by telephone at the time of interpretation on 11/30/2020 at 2:01 am to provider Lattie Haw, who will convery the results urgently to [facility] , who verbally acknowledged these results. Electronically Signed   By: Keith Rake M.D.   On: 11/30/2020 02:02   DG CHEST PORT 1 VIEW  Result Date: 11/30/2020 CLINICAL DATA:  Right central line placement EXAM: PORTABLE CHEST 1 VIEW COMPARISON:  March 25, 2007 FINDINGS: A right central line is been placed in the interval with the distal tip in the central SVC. No pneumothorax. The lower chest is not completely included on today's film, particularly on the left. Mild increased haziness in the left upper lobe. No other acute abnormalities. IMPRESSION: 1. A right central line has been placed in the interval with the distal tip in the SVC. No pneumothorax. 2. Limited study as the lung bases were not completely included and the patient is rotated. There is mild haziness in the left upper lung which could be due to positioning or subtle infiltrate. A better positioned PA and lateral chest x-ray could better evaluate. Electronically Signed   By: Dorise Bullion III M.D   On: 11/30/2020 09:07   ECHOCARDIOGRAM COMPLETE  Result Date: 12/06/2020    ECHOCARDIOGRAM REPORT   Patient Name:   James Coffey [facility] Date of Exam: 12/06/2020 Medical Rec #:  [id number]    Height:       72.0 in Accession #:    [id number]   Weight:       165.6 lb Date of Birth:  07/31/1970    BSA:          1.966 m Patient Age:    68 years     BP:           107/64 mmHg Patient Gender: M            HR:           102 bpm. Exam Location:  Inpatient Procedure: 2D Echo, Cardiac Doppler and Color Doppler Indications:    I42.9 Cardiomyopathy  (unspecified)  History:        Patient has no prior history of Echocardiogram examinations.  Sonographer:    Jonelle Sidle Dance Referring Phys:  [id number] [place]  1. Left ventricular ejection fraction, by estimation, is 55 to 60%. The left ventricle has normal function. The left ventricle has no regional wall motion abnormalities. Left ventricular diastolic parameters were normal.  2. Right ventricular systolic function is normal. The right ventricular size is normal.  3. The mitral valve is grossly normal. Trivial mitral valve regurgitation.  4. The aortic valve is grossly normal. Aortic valve regurgitation is not visualized. FINDINGS  Left Ventricle: Left ventricular ejection fraction, by estimation, is 55 to 60%. The left ventricle has normal function. The left ventricle has no regional wall motion abnormalities. The left ventricular internal cavity size was normal in size. There is  no left ventricular hypertrophy. Left ventricular diastolic parameters were normal. Right Ventricle: The right ventricular size is normal. Right vetricular wall thickness was not well visualized. Right ventricular systolic function is normal. Left Atrium: Left atrial size was normal in size. Right Atrium: Right atrial size was normal in size. Pericardium: There is no evidence of pericardial effusion. Mitral Valve: The mitral valve is grossly normal. Trivial mitral valve regurgitation. Tricuspid Valve: The tricuspid valve is normal in structure. Tricuspid valve regurgitation is trivial. Aortic Valve: The aortic valve is grossly normal. Aortic valve regurgitation is not visualized. Pulmonic Valve: The pulmonic valve was normal in structure. Pulmonic valve regurgitation is not visualized. Aorta: The aortic root and ascending aorta are structurally normal, with no evidence of dilitation. IAS/Shunts: The atrial septum is grossly normal.  LEFT VENTRICLE PLAX 2D LVIDd:         5.00 cm  Diastology LVIDs:         3.70 cm  LV e'  medial:    10.60 cm/s LV PW:         1.00 cm  LV E/e' medial:  6.5 LV IVS:        1.10 cm  LV e' lateral:   16.40 cm/s LVOT diam:     2.00 cm  LV E/e' lateral: 4.2 LV SV:         58 LV SV Index:   29 LVOT Area:     3.14 cm  RIGHT VENTRICLE             IVC RV Basal diam:  3.20 cm     IVC diam: 1.70 cm RV Mid diam:    2.10 cm RV S prime:     14.10 cm/s TAPSE (M-mode): 2.3 cm LEFT ATRIUM             Index       RIGHT ATRIUM           Index LA diam:        3.50 cm 1.78 cm/m  RA Area:     15.00 cm LA Vol (A2C):   64.5 ml 32.81 ml/m RA Volume:   37.90 ml  19.28 ml/m LA Vol (A4C):   42.9 ml 21.82 ml/m LA Biplane Vol: 53.1 ml 27.01 ml/m  AORTIC VALVE LVOT Vmax:   125.00 cm/s LVOT Vmean:  75.900 cm/s LVOT VTI:    0.184 m  AORTA Ao Root diam: 3.00 cm Ao Asc diam:  3.20 cm MITRAL VALVE MV Area (PHT): 4.68 cm    SHUNTS MV Decel Time: 162 msec    Systemic VTI:  0.18 m MV E velocity: 69.20 cm/s  Systemic Diam: 2.00 cm MV A velocity: 58.10 cm/s MV E/A ratio:  1.19 Mertie Moores MD Electronically signed by Mertie Moores MD Signature Date/Time: 12/06/2020/2:58:19  PM    Final    [place] EKG SITE RITE  Result Date: 12/05/2020 If Site Rite image not attached, placement could not be confirmed due to current cardiac rhythm.   Pathology:  SURGICAL PATHOLOGY  CASE: [phone]  PATIENT: James Coffey  Surgical Pathology Report   Clinical History: Fournier's Gangrene; Cancer ? ([facility])   FINAL MICROSCOPIC DIAGNOSIS:   A. IRRIGATION AND DEBRIDEMENT OF SCROTUM AND PENIS:  Acute necrotizing inflammation extensively involving fascia    B. Rectal wall biopsies:  Adenocarcinoma of the rectum   Assessment and Plan:  This is a 50 year old male with  1) rectal adenocarcinoma/rectal stricturing -11/30/2020 CT of the abdomen/pelvis with contrast - "1. Worsening perineal inflammation, including right peri-rectal. Significant progression of soft tissue gas, now extending anteriorly into the perineum, base of the penis, both  scrotal sacs and anterior abdominal wall. Findings are highly suspicious for necrotizing soft tissue infection, and urgent surgical consult is recommended. 2. Irregular presacral nodule measuring 2.2 cm may represent an enlarged lymph node but is nonspecific. Recommend follow-up after course of treatment. There is rectal wall thickening which may be inflammatory, however neoplasm is not excluded." -12/13/2020-diverting loop colostomy. -12/14/2020 CT of the chest with contrast - "1. No CT evidence of metastatic disease in the chest. 2. Small bilateral pleural effusions with associated partial compressive atelectasis of the lower lobes. Pneumonia is not excluded. Clinical correlation is recommended. 3. Partially visualized pneumoperitoneum. This may be related to recent surgery. Clinical correlation is  recommended. 4. Aortic Atherosclerosis (ICD10-I70.0) and Emphysema (ICD10-J43.9)."  2) Fournier's gangrene  3) normocytic anemia  4) tobacco dependence  PLAN: -The patient does not appear to have any obvious metastatic disease on CT scan. -The patient may be a reasonable candidate for consideration of concurrent chemoradiation.  Further recommendations for treatment per Dr. Irene Limbo. -GI planning for colonoscopy until perineal wounds have healed.  Thank you for this referral.   Mikey Bussing, DNP, AGPCNP-BC, AOCNP    ADDENDUM  .Patient was Personally and independently interviewed, examined and relevant elements of the history of present illness were reviewed in details and an assessment and plan was created. All elements of the patient's history of present illness , assessment and plan were discussed in details with Mikey Bussing, DNP, AGPCNP-BC, AOCNP . The above documentation reflects our combined findings assessment and plan.   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  Patient is healing from his multiple I&D and surgical debridements and from his recent colostomy. He still has an indwelling foleys catheter.  Imaging with no overt distant metastatic disease  GI planning on colonoscopy as outpatient.  We will set him up for an oncology followup as outpatient . He will likely need outpatient MRI pelvis in a few weeks once inflammation settles down to determine local extent of disease.  Will likely plan to pursue Xeloda based chemo-radiation + Xelox chemo followed by consideration of potential surgical resection.  Sullivan Lone MD MS

## 2020-12-17 NOTE — Progress Notes (Signed)
Progress Note  4 Days Post-Op  Subjective: Patient very hopeful to be able to go home tomorrow. He is aware he needs to work with colostomy more and understand how to care for this before going home. He reports pain in wounds is stable but tolerating dressing changes well.   Objective: Vital signs in last 24 hours: Temp:  [98.5 F (36.9 C)-98.9 F (37.2 C)] 98.6 F (37 C) (07/12 0518) Pulse Rate:  [64-71] 71 (07/12 0518) Resp:  [16-18] 18 (07/12 0518) BP: (95-110)/(63-74) 110/74 (07/12 0518) SpO2:  [98 %-100 %] 100 % (07/12 0518) Last BM Date: 12/16/20 (per ostomy)  Intake/Output from previous day: 07/11 0701 - 07/12 0700 In: 1110 [P.O.:1110] Out: 575 [Urine:350; Stool:225] Intake/Output this shift: No intake/output data recorded.  PE: Gen: Awake and alert, pleasant Lungs: Normal rate and effort Heart: RRR Abd: soft, NT, ND, suprapubic and infraumbilical wounds stable, lateral wounds shallow, midline incision c/d/I with staples present, colostomy viable with large amount soft brown stool in bag, red rubber bridge present  GU: foley present, perineal wound clean, barrier cream over buttocks and perineum   Lab Results:  Recent Labs    12/15/20 0350 12/17/20 0430  WBC 10.9* 10.2  HGB 10.8* 10.7*  HCT 35.6* 35.1*  PLT 445* 388   BMET Recent Labs    12/15/20 0350 12/16/20 0300  NA 140 139  K 3.4* 3.4*  CL 105 103  CO2 28 27  GLUCOSE 103* 110*  BUN 13 10  CREATININE 0.46* 0.50*  CALCIUM 8.8* 8.8*   PT/INR No results for input(s): LABPROT, INR in the last 72 hours. CMP     Component Value Date/Time   NA 139 12/16/2020 0300   K 3.4 (L) 12/16/2020 0300   CL 103 12/16/2020 0300   CO2 27 12/16/2020 0300   GLUCOSE 110 (H) 12/16/2020 0300   BUN 10 12/16/2020 0300   CREATININE 0.50 (L) 12/16/2020 0300   CALCIUM 8.8 (L) 12/16/2020 0300   PROT 6.5 12/14/2020 0304   ALBUMIN 2.8 (L) 12/14/2020 0304   AST 15 12/14/2020 0304   ALT 16 12/14/2020 0304   ALKPHOS  59 12/14/2020 0304   BILITOT 0.8 12/14/2020 0304   GFRNONAA >60 12/16/2020 0300   Lipase     Component Value Date/Time   LIPASE 22 11/03/2020 1714       Studies/Results: No results found.  Anti-infectives: Anti-infectives (From admission, onward)    Start     Dose/Rate Route Frequency Ordered Stop   12/04/20 2200  ceFEPIme (MAXIPIME) 2 g in sodium chloride 0.9 % 100 mL IVPB  Status:  Discontinued        2 g 200 mL/hr over 30 Minutes Intravenous Every 8 hours 12/04/20 1327 12/14/20 1352   12/04/20 2200  metroNIDAZOLE (FLAGYL) IVPB 500 mg  Status:  Discontinued        500 mg 100 mL/hr over 60 Minutes Intravenous Every 12 hours 12/04/20 1327 12/14/20 1352   12/02/20 1400  linezolid (ZYVOX) IVPB 600 mg  Status:  Discontinued        600 mg 300 mL/hr over 60 Minutes Intravenous Every 12 hours 12/02/20 1138 12/04/20 1327   11/30/20 1500  vancomycin (VANCOREADY) IVPB 1250 mg/250 mL  Status:  Discontinued        1,250 mg 166.7 mL/hr over 90 Minutes Intravenous Every 12 hours 11/30/20 0606 12/02/20 1138   11/30/20 0600  piperacillin-tazobactam (ZOSYN) IVPB 3.375 g  Status:  Discontinued  3.375 g 12.5 mL/hr over 240 Minutes Intravenous Every 8 hours 11/30/20 0237 12/04/20 1327   11/30/20 0403  piperacillin-tazobactam (ZOSYN) 3.375 GM/50ML IVPB       Note to Pharmacy: Hedy Camara   : cabinet override      11/30/20 0403 12/01/20 0206   11/30/20 0230  clindamycin (CLEOCIN) IVPB 600 mg  Status:  Discontinued        600 mg 100 mL/hr over 30 Minutes Intravenous  Once 11/30/20 0219 11/30/20 0237   11/30/20 0000  ceFEPIme (MAXIPIME) 2 g in sodium chloride 0.9 % 100 mL IVPB        2 g 200 mL/hr over 30 Minutes Intravenous  Once 11/29/20 2347 11/30/20 0033   11/30/20 0000  vancomycin (VANCOREADY) IVPB 1500 mg/300 mL        1,500 mg 150 mL/hr over 120 Minutes Intravenous  Once 11/29/20 2347 11/30/20 0300   11/29/20 2345  vancomycin (VANCOCIN) IVPB 1000 mg/200 mL premix  Status:   Discontinued        1,000 mg 200 mL/hr over 60 Minutes Intravenous  Once 11/29/20 2340 11/29/20 2347   11/29/20 2345  ceFEPIme (MAXIPIME) 1 g in sodium chloride 0.9 % 100 mL IVPB  Status:  Discontinued        1 g 200 mL/hr over 30 Minutes Intravenous  Once 11/29/20 2340 11/29/20 2347        Assessment/Plan POD17 s/p EUA, rectal wall bx, I&D of perirectal, scrotal, penile and abdominal regions for Fournier's gangrene - Dr. Johney Maine w/ Dr. Alinda Money - 11/30/20 POD16 s/p I&D of perirectal, scrotal, penile and abdominal regions for Fournier's gangrene, rectal stricturing/possible cancer - Dr. Johney Maine and Dr. Alinda Money - 12/01/20 POD13 s/p EUA w/ I&D for Fournier's gangrene - Dr. Hassell Done and Dr. Alinda Money - 12/04/20 - Wounds clean. No further debridement indicated at this time POD4 s/p lap converted to open diverting loop colostomy Dr. Harlow Asa 12/13/2020   -Continue packing wounds / dressing changes.  Now that he is diverted, can transition to just daily in order to endorse micro debridement of the accumulating fibrinous exudate - Desitin for periwound fungal infection around right perineum incision . Avoid tape (use mesh undergarment). Keep periwound clean and dry - E. Coli, Enterobacter cloacae, Bacteroides Ovatus, Beta Lactamase + -he has had 2 weeks of IV antibiotics and over 7 days since his last surgical debridement.  Okay to stop antibiotics and follow - Foley per urology - Appreciate CCM/TRH assistance  Adenocarcinoma of the rectum Rectal Stricturing - Noted from bx of rectal wall - GI consulted for colonoscopy - recommending waiting until perineal wounds have healed - S/p fecal diversion with loop colostomy 7/8 - WOC to start colostomy teaching - CEA 16.7 - Oncology consulted, will need follow up  - Has had CT A/P during admission. CT chest 7/9 shows no evidence of metastatic disease. - good colostomy output   FEN - Reg VTE - SCDs, Lovenox ID - Antibiotics completed Foley - in place   Shock -  resolved Tobacco use ABL anemia w/ + FOBT - hgb stable  LOS: 17 days    Norm Parcel, [facility] 12/17/2020, 9:24 AM Please see Amion for pager number during day hours 7:00am-4:30pm

## 2020-12-17 NOTE — TOC Initial Note (Signed)
Transition of Care [facility]) - Initial/Assessment Note    Patient Details  Name: James Coffey MRN: [id number] Date of Birth: [birth year]  Transition [facility]) CM/SW Contact:    Trish Mage, LCSW Phone Number: 12/17/2020, 1:57 PM  Clinical Narrative:    Patient who lives at home with family in [place] is without insurance, PCP, needs PCP and charity [facility] RN.  Patient has appointment at [facility] for PCP.   Spoke with Levada Dy at [facility] who is covering for [employer]. She will find out about possibility of [facility] RN and let me know. TOC will continue to follow during the course of hospitalization.             Expected Discharge Plan: [place] Services Barriers to Discharge: Other (must enter comment) (looking for [facility] RN provider)   Patient Goals and CMS Choice Patient states their goals for this hospitalization and ongoing recovery are:: to go home and be able to drive CMS Medicare.gov Compare Post Acute Care list provided to:: Patient    Expected Discharge Plan and Services Expected Discharge Plan: [place]   Discharge Planning Services: CM Consult   Living arrangements for the past 2 months: Apartment                                      Prior Living Arrangements/Services Living arrangements for the past 2 months: Apartment Lives with:: Spouse Patient language and need for interpreter reviewed:: Yes Do you feel safe going back to the place where you live?: Yes            Criminal Activity/Legal Involvement Pertinent to Current Situation/Hospitalization: No - Comment as needed  Activities of Daily Living Home Assistive Devices/Equipment: None ADL Screening (condition at time of admission) Patient's cognitive ability adequate to safely complete daily activities?: Yes Is the patient deaf or have difficulty hearing?: No Does the patient have difficulty seeing, even when wearing glasses/contacts?: No Does the patient have difficulty  concentrating, remembering, or making decisions?: No Patient able to express need for assistance with ADLs?: Yes Does the patient have difficulty dressing or bathing?: Yes Independently performs ADLs?: Yes (appropriate for developmental age) (some) Does the patient have difficulty walking or climbing stairs?: No Weakness of Legs: Both Weakness of Arms/Hands: None  Permission Sought/Granted                  Emotional Assessment Appearance:: Appears stated age Attitude/Demeanor/Rapport: Engaged Affect (typically observed): Calm Orientation: : Oriented to Place, Oriented to Self, Oriented to  Time, Oriented to Situation Alcohol / Substance Use: Not Applicable Psych Involvement: No (comment)  Admission diagnosis:  Cellulitis of perineum [L03.315] Perirectal abscess [K61.1] 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] Patient Active Problem List   Diagnosis Date Noted   Malnutrition of moderate degree 12/04/2020   Rectal cancer ([place]) 12/04/2020   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 11/30/2020   [place] speaking patient - Prefers [place] or [place] 11/30/2020   Fourniers gangrene 11/30/2020   Perianal abscess    Perirectal abscess 11/03/2020   Cellulitis 11/03/2020   Proctitis 11/03/2020   Sepsis ([place]) 11/03/2020   Tobacco abuse 11/03/2020   PCP:  Merryl Hacker, No Pharmacy:   [facility] [place], [place] AT [facility] [address] [place] [address] Phone: [phone] Fax: [phone]  Social Determinants of Health (SDOH) Interventions    Readmission Risk Interventions No flowsheet data found.   

## 2020-12-18 ENCOUNTER — Telehealth: Payer: Self-pay | Admitting: Hematology

## 2020-12-18 LAB — BASIC METABOLIC PANEL
Anion gap: 5 (ref 5–15)
BUN: 17 mg/dL (ref 6–20)
CO2: 28 mmol/L (ref 22–32)
Calcium: 8.6 mg/dL — ABNORMAL LOW (ref 8.9–10.3)
Chloride: 111 mmol/L (ref 98–111)
Creatinine, Ser: 0.48 mg/dL — ABNORMAL LOW (ref 0.61–1.24)
GFR, Estimated: >60 mL/min (ref >60)
Glucose, Bld: 118 mg/dL — ABNORMAL HIGH (ref 70–99)
Potassium: 3.1 mmol/L — ABNORMAL LOW (ref 3.5–5.1)
Sodium: 144 mmol/L (ref 135–145)

## 2020-12-18 LAB — CBC
HCT: 31.9 % — ABNORMAL LOW (ref 39.0–52.0)
Hemoglobin: 9.8 g/dL — ABNORMAL LOW (ref 13.0–17.0)
MCH: 29.3 pg (ref 26.0–34.0)
MCHC: 30.7 g/dL (ref 30.0–36.0)
MCV: 95.5 fL (ref 80.0–100.0)
Platelets: 320 10*3/uL (ref 150–400)
RBC: 3.34 MIL/uL — ABNORMAL LOW (ref 4.22–5.81)
RDW: 17.5 % — ABNORMAL HIGH (ref 11.5–15.5)
WBC: 8.3 10*3/uL (ref 4.0–10.5)
nRBC: 0 % (ref 0.0–0.2)

## 2020-12-18 LAB — GLUCOSE, CAPILLARY
Glucose-Capillary: 98 mg/dL (ref 70–99)
Glucose-Capillary: 109 mg/dL — ABNORMAL HIGH (ref 70–99)
Glucose-Capillary: 147 mg/dL — ABNORMAL HIGH (ref 70–99)
Glucose-Capillary: 230 mg/dL — ABNORMAL HIGH (ref 70–99)

## 2020-12-18 LAB — PHOSPHORUS: Phosphorus: 3.4 mg/dL (ref 2.5–4.6)

## 2020-12-18 LAB — MAGNESIUM: Magnesium: 2 mg/dL (ref 1.7–2.4)

## 2020-12-18 MED ORDER — MELATONIN 3 MG PO TABS
3.0000 mg | ORAL_TABLET | Freq: Every day | ORAL | Status: DC
  Administered 2020-12-18: 3 mg via ORAL
  Filled 2020-12-18: qty 1

## 2020-12-18 MED ORDER — POTASSIUM CHLORIDE CRYS ER 20 MEQ PO TBCR
40.0000 meq | EXTENDED_RELEASE_TABLET | Freq: Two times a day (BID) | ORAL | Status: DC
  Administered 2020-12-18 – 2020-12-19 (×3): 40 meq via ORAL
  Filled 2020-12-18 (×3): qty 2

## 2020-12-18 NOTE — Progress Notes (Signed)
5 Days Post-Op    CC: Fournier's gangrene  Subjective: Sites all look good, he has staples in the midline, ostomy is working well.  He wants [place] to send someone home with him for the wound packing.  We were told his daughter is a CNA, he asked like she might not be able to do it.  He says he is never been trained to do it.  Objective: Vital signs in last 24 hours: Temp:  [98.3 F (36.8 C)-98.9 F (37.2 C)] 98.9 F (37.2 C) (07/13 0527) Pulse Rate:  [65-70] 70 (07/13 0527) Resp:  [15-18] 18 (07/13 0527) BP: (91-110)/(56-72) 110/72 (07/13 0527) SpO2:  [99 %-100 %] 99 % (07/13 0527) Last BM Date: 12/18/20 1440 p.o. 1900 urine 1150 stool Afebrile vital signs are stable K+ 3.1, glucose 118, creatinine 0.48 WBC 8.3 Intake/Output from previous day: 07/12 0701 - 07/13 0700 In: 1440 [P.O.:1440] Out: 3050 [Urine:1900; Stool:1150] Intake/Output this shift: Total I/O In: 0  Out: 175 [Urine:175]  General appearance: alert, cooperative, no distress, and tolerating a regular diet. Resp: clear to auscultation bilaterally GI: Abdomen soft, nondistended, all the open I&D sites look fine.  The ostomy is working well.  Midline surgical wound with staples intact, healing nicely.  Lab Results:  Recent Labs    12/17/20 0430 12/18/20 0400  WBC 10.2 8.3  HGB 10.7* 9.8*  HCT 35.1* 31.9*  PLT 388 320    BMET Recent Labs    12/16/20 0300 12/18/20 0400  NA 139 144  K 3.4* 3.1*  CL 103 111  CO2 27 28  GLUCOSE 110* 118*  BUN 10 17  CREATININE 0.50* 0.48*  CALCIUM 8.8* 8.6*   PT/INR No results for input(s): LABPROT, INR in the last 72 hours.  Recent Labs  Lab 12/12/20 0432 12/14/20 0304  AST 14* 15  ALT 16 16  ALKPHOS 62 59  BILITOT 0.6 0.8  PROT 6.2* 6.5  ALBUMIN 2.7* 2.8*     Lipase     Component Value Date/Time   LIPASE 22 11/03/2020 1714     Medications:  (feeding supplement) PROSource Plus  30 mL Oral BID BM   acetaminophen  1,000 mg Oral Q6H   vitamin C   500 mg Oral BID   bacitracin   Topical BID   Chlorhexidine Gluconate Cloth  6 each Topical Daily   cholestyramine light  4 g Oral QID   enoxaparin (LOVENOX) injection  40 mg Subcutaneous Daily   feeding supplement  237 mL Oral BID BM   ferrous sulfate  325 mg Oral Q breakfast   hydrocortisone  20 mg Oral Daily   Followed by   Derrill Memo ON 12/20/2020] hydrocortisone  10 mg Oral Daily   Followed by   Derrill Memo ON 12/23/2020] hydrocortisone  5 mg Oral Daily   ibuprofen  600 mg Oral TID   insulin aspart  0-9 Units Subcutaneous TID WC   lip balm  1 application Topical BID   liver oil-zinc oxide   Topical BID   mouth rinse  15 mL Mouth Rinse BID   methocarbamol  500 mg Oral TID   midodrine  10 mg Oral TID WC   multivitamin with minerals  1 tablet Oral Daily   nicotine  21 mg Transdermal Daily   nutrition supplement (JUVEN)  1 packet Oral BID BM   pantoprazole  40 mg Oral QHS   sodium chloride flush  10-40 mL Intracatheter Q12H    Assessment/Plan POD18 s/p EUA, rectal wall bx,  I&D of perirectal, scrotal, penile and abdominal regions for Fournier's gangrene - Dr. Johney Coffey w/ Dr. Alinda Coffey - 11/30/20 POD167s/p I&D of perirectal, scrotal, penile and abdominal regions for Fournier's gangrene, rectal stricturing/possible cancer - Dr. Johney Coffey and Dr. Alinda Coffey - 12/01/20 POD14 s/p EUA w/ I&D for Fournier's gangrene - Dr. Hassell Coffey and Dr. Alinda Coffey - 12/04/20 - Wounds clean. No further debridement indicated at this time POD5 s/p lap converted to open diverting loop colostomy Dr. Harlow Coffey 12/13/2020   -Continue packing wounds / dressing changes.  Now that he is diverted, can transition to just daily in order to endorse micro debridement of the accumulating fibrinous exudate - Desitin for periwound fungal infection around right perineum incision . Avoid tape (use mesh undergarment). Keep periwound clean and dry - E. Coli, Enterobacter cloacae, Bacteroides Ovatus, Beta Lactamase + -he has had 2 weeks of IV antibiotics and over 7  days since his last surgical debridement.  Okay to stop antibiotics and follow - Foley per urology - Appreciate CCM/TRH assistance  Adenocarcinoma of the rectum Rectal Stricturing - Noted from bx of rectal wall - GI consulted for colonoscopy - recommending waiting until perineal wounds have healed - S/p fecal diversion with loop colostomy 7/8 - WOC to start colostomy teaching - CEA 16.7 - Oncology consulted, will need follow up  - Has had CT A/P during admission. CT chest 7/9 shows no evidence of metastatic disease. - good colostomy output   FEN - Reg VTE - SCDs, Lovenox ID - Antibiotics completed Foley - in place   Shock - resolved Tobacco use ABL anemia w/ + FOBT - hgb stable   Plan: We do not take his rod out of his ostomy today.  He will need to have his staples removed in about 5 days.  He will need packing of his open wounds twice a day at home.  He will also need help with his ostomy at home.  I think once those issues were addressed he can go home.  The case managers working on this issue now.  LOS: 18 days    James Coffey 12/18/2020 Please see Amion

## 2020-12-18 NOTE — TOC Progression Note (Signed)
Transition of Care [facility]) - Progression Note    Patient Details  Name: James Coffey MRN: [id number] Date of Birth: June 18, 1970  [facility] CM/SW Contact  Timmothy Baranowski, Marjie Skiff, RN Phone Number: 12/18/2020, 12:03 PM  Clinical Narrative:     This CM was contacted by Lattie Haw from [place]. They have declined to provide [facility] services for pt. RN made aware that teaching will need to be done with pt and caregivers.  Expected Discharge Plan: [place] Services Barriers to Discharge: Other (must enter comment) (looking for [facility] RN provider)  Expected Discharge Plan and Services Expected Discharge Plan: [place]   Discharge Planning Services: CM Consult   Living arrangements for the past 2 months: Apartment                 Readmission Risk Interventions No flowsheet data found.

## 2020-12-18 NOTE — Progress Notes (Signed)
PROGRESS NOTE    James Coffey  WUJ:[id number] DOB: [birth year] DOA: 11/29/2020 PCP: Pcp, No   Brief Narrative:  This 50 years old male with PMH significant for tobacco abuse, history of rectal cancer presented in the ED with septic shock secondary to Fournier's gangrene of the perirectal, scrotal, penile and abdominal regions.  Patient was initially admitted in the ICU,  he was requiring vasopressors to maintain blood pressure.  Blood pressure has improved.  He is off pressors now,  he underwent multiple incision and drainage and surgical debridements,  general surgery and urology is following.  Patient is off antibiotics now.  He was started on midodrine for blood pressure support.  He underwent laparoscopy and descending loop colostomy.  Significant hospital events. 6/25 I&D and debridement. Central line placement 6/26 Second I&D 6/28 on vasopressors. 6/29 third I&D 6/30 remains on levo, great urine output. Received one unit PRBC overnight for hgb 7.1 7/1 PICC line placed and CVC removed overnight, pressor requirement decreased with addition of midodrine D/c'd levophed. 7/8: Diagnostic laparoscopy, open descending loop colostomy 7/9: CT chest without evidence of metastatic disease.    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  All improved now.   Patient is on prolonged steroids, currently on tapering dose.  Also remains on midodrine.  We will gradually taper off.   Completed antibiotic therapy for E. coli and multiple bacteria's.   Local wound  care.  Trying to find out wound care at home before discharge.   Patient is still using IV fentanyl for pain, will discontinue and encourage using oral pain medications to prepare for discharge.  Adenocarcinoma of rectum. It was noted during debridement of Fournier's gangrene. GI following, recommended colonoscopy once perineal wounds are healed. CT chest completed without metastatic disease. He underwent diagnostic laparoscopy, open descending loop colostomy 12/13/20 , tolerated well. Oncology consulted, they will schedule outpatient follow-up for chemotherapy. Continue Foley catheter per urology due to perineal wounds. Will need care at home. Patient will need surgical staples removal in 5 days as per surgery.  Tobacco use disorder: Nicotine patch.  Normocytic normochromic anemia. Hb remained stable,  continue iron supplementation.  DVT prophylaxis: Lovenox Code Status: Full code. Family Communication: No family at bed side. Disposition Plan:   Status is: Inpatient  Remains inpatient appropriate because:Inpatient level of care appropriate due to severity of illness  Dispo: The patient is from: Home              Anticipated d/c is to: Home with home services              Patient currently is medically stable.  Logistics at home needs to be arranged.   Difficult to place patient No  Consultants:  General surgery, urology, PCCM  Procedures: Multiple incision and drainage and debridements  Antimicrobials:   Anti-infectives (From admission, onward)    Start     Dose/Rate Route Frequency Ordered Stop   12/04/20 2200  ceFEPIme (MAXIPIME) 2 g in sodium chloride 0.9 % 100 mL IVPB  Status:  Discontinued  2 g 200 mL/hr over 30 Minutes Intravenous Every 8 hours 12/04/20 1327 12/14/20 1352   12/04/20 2200  metroNIDAZOLE (FLAGYL) IVPB 500 mg  Status:  Discontinued        500 mg 100 mL/hr over 60 Minutes Intravenous Every 12 hours 12/04/20 1327 12/14/20 1352   12/02/20 1400   linezolid (ZYVOX) IVPB 600 mg  Status:  Discontinued        600 mg 300 mL/hr over 60 Minutes Intravenous Every 12 hours 12/02/20 1138 12/04/20 1327   11/30/20 1500  vancomycin (VANCOREADY) IVPB 1250 mg/250 mL  Status:  Discontinued        1,250 mg 166.7 mL/hr over 90 Minutes Intravenous Every 12 hours 11/30/20 0606 12/02/20 1138   11/30/20 0600  piperacillin-tazobactam (ZOSYN) IVPB 3.375 g  Status:  Discontinued        3.375 g 12.5 mL/hr over 240 Minutes Intravenous Every 8 hours 11/30/20 0237 12/04/20 1327   11/30/20 0403  piperacillin-tazobactam (ZOSYN) 3.375 GM/50ML IVPB       Note to Pharmacy: Hedy Camara   : cabinet override      11/30/20 0403 12/01/20 0206   11/30/20 0230  clindamycin (CLEOCIN) IVPB 600 mg  Status:  Discontinued        600 mg 100 mL/hr over 30 Minutes Intravenous  Once 11/30/20 0219 11/30/20 0237   11/30/20 0000  ceFEPIme (MAXIPIME) 2 g in sodium chloride 0.9 % 100 mL IVPB        2 g 200 mL/hr over 30 Minutes Intravenous  Once 11/29/20 2347 11/30/20 0033   11/30/20 0000  vancomycin (VANCOREADY) IVPB 1500 mg/300 mL        1,500 mg 150 mL/hr over 120 Minutes Intravenous  Once 11/29/20 2347 11/30/20 0300   11/29/20 2345  vancomycin (VANCOCIN) IVPB 1000 mg/200 mL premix  Status:  Discontinued        1,000 mg 200 mL/hr over 60 Minutes Intravenous  Once 11/29/20 2340 11/29/20 2347   11/29/20 2345  ceFEPIme (MAXIPIME) 1 g in sodium chloride 0.9 % 100 mL IVPB  Status:  Discontinued        1 g 200 mL/hr over 30 Minutes Intravenous  Once 11/29/20 2340 11/29/20 2347        Subjective: Patient seen and examined.  He is walking in the hallway today.  He knows he has limited support at home.  He will need twice a day dressing changes and home health nurse and that is pending. He was still using IV fentanyl for pain relief, he is agreeable to use Tylenol and oxycodone so we can discharge him on those medications. He is motivated to go home. He will be discharged with  wound care, colostomy care.  He will also go home with Foley catheter.   Objective: Vitals:   12/17/20 0518 12/17/20 1410 12/17/20 2041 12/18/20 0527  BP: 110/74 100/66 (!) 91/56 110/72  Pulse: 71 68 65 70  Resp: 18 15 18 18   Temp: 98.6 F (37 C) 98.3 F (36.8 C) 98.7 F (37.1 C) 98.9 F (37.2 C)  TempSrc: Oral Oral Oral Oral  SpO2: 100% 100% 99% 99%  Weight:      Height:        Intake/Output Summary (Last 24 hours) at 12/18/2020 1242 Last data filed at 12/18/2020 1000 Gross per 24 hour  Intake 1320 ml  Output 2875 ml  Net -1555 ml   Filed Weights   11/30/20 0800 12/10/20 1703 12/13/20 0920  Weight: 75.1 kg  64 kg 64 kg    Examination:  General: Fairly comfortable.  Well-built.  On room air. Cardiovascular: S1-S2 normal. Respiratory: Bilateral clear. Gastrointestinal: Soft and nontender. Patient has midline surgical incision with staples intact.  There is a small opening on the inferior aspect of the wound that is packed.  He has multiple ports with intact staples. Colostomy left lower quadrant with loose stool. Patient has clean perineal wound extending from perineum to scrotum, dressing intact.     Data Reviewed: I have personally reviewed following labs and imaging studies  CBC: Recent Labs  Lab 12/13/20 0416 12/14/20 0304 12/15/20 0350 12/17/20 0430 12/18/20 0400  WBC 10.8* 12.9* 10.9* 10.2 8.3  HGB 9.9* 10.3* 10.8* 10.7* 9.8*  HCT 33.5* 33.5* 35.6* 35.1* 31.9*  MCV 97.1 94.1 95.2 94.1 95.5  PLT 478* 488* 445* 388 888   Basic Metabolic Panel: Recent Labs  Lab 12/12/20 0432 12/13/20 0416 12/14/20 0304 12/15/20 0350 12/16/20 0300 12/18/20 0400  NA 140 139 139 140 139 144  K 4.1 4.0 3.8 3.4* 3.4* 3.1*  CL 108 107 103 105 103 111  CO2 26 27 30 28 27 28   GLUCOSE 135* 128* 130* 103* 110* 118*  BUN 17 16 15 13 10 17   CREATININE 0.58* 0.50* 0.58* 0.46* 0.50* 0.48*  CALCIUM 8.9 8.9 9.1 8.8* 8.8* 8.6*  MG 2.2 2.2 2.2  --   --  2.0  PHOS 3.2 3.3  3.8  --   --  3.4   GFR: Estimated Creatinine Clearance: 98.9 mL/min (A) (by C-G formula based on SCr of 0.48 mg/dL (L)). Liver Function Tests: Recent Labs  Lab 12/12/20 0432 12/14/20 0304  AST 14* 15  ALT 16 16  ALKPHOS 62 59  BILITOT 0.6 0.8  PROT 6.2* 6.5  ALBUMIN 2.7* 2.8*   No results for input(s): LIPASE, AMYLASE in the last 168 hours. No results for input(s): AMMONIA in the last 168 hours. Coagulation Profile: No results for input(s): INR, PROTIME in the last 168 hours. Cardiac Enzymes: No results for input(s): CKTOTAL, CKMB, CKMBINDEX, TROPONINI in the last 168 hours. BNP (last 3 results) No results for input(s): PROBNP in the last 8760 hours. HbA1C: No results for input(s): HGBA1C in the last 72 hours. CBG: Recent Labs  Lab 12/17/20 1207 12/17/20 1718 12/17/20 2153 12/18/20 0752 12/18/20 1153  GLUCAP 149* 178* 142* 98 109*   Lipid Profile: No results for input(s): CHOL, HDL, LDLCALC, TRIG, CHOLHDL, LDLDIRECT in the last 72 hours. Thyroid Function Tests: No results for input(s): TSH, T4TOTAL, FREET4, T3FREE, THYROIDAB in the last 72 hours. Anemia Panel: No results for input(s): VITAMINB12, FOLATE, FERRITIN, TIBC, IRON, RETICCTPCT in the last 72 hours. Sepsis Labs: No results for input(s): PROCALCITON, LATICACIDVEN in the last 168 hours.  No results found for this or any previous visit (from the past 240 hour(s)).   Radiology Studies: No results found.  Scheduled Meds:  (feeding supplement) PROSource Plus  30 mL Oral BID BM   acetaminophen  1,000 mg Oral Q6H   vitamin C  500 mg Oral BID   bacitracin   Topical BID   Chlorhexidine Gluconate Cloth  6 each Topical Daily   cholestyramine light  4 g Oral QID   enoxaparin (LOVENOX) injection  40 mg Subcutaneous Daily   feeding supplement  237 mL Oral BID BM   ferrous sulfate  325 mg Oral Q breakfast   hydrocortisone  20 mg Oral Daily   Followed by   Derrill Memo ON 12/20/2020] hydrocortisone  10 mg Oral Daily    Followed by   Derrill Memo ON 12/23/2020] hydrocortisone  5 mg Oral Daily   ibuprofen  600 mg Oral TID   insulin aspart  0-9 Units Subcutaneous TID WC   lip balm  1 application Topical BID   liver oil-zinc oxide   Topical BID   mouth rinse  15 mL Mouth Rinse BID   methocarbamol  500 mg Oral TID   midodrine  10 mg Oral TID WC   multivitamin with minerals  1 tablet Oral Daily   nicotine  21 mg Transdermal Daily   nutrition supplement (JUVEN)  1 packet Oral BID BM   pantoprazole  40 mg Oral QHS   potassium chloride  40 mEq Oral BID   sodium chloride flush  10-40 mL Intracatheter Q12H   Continuous Infusions:   LOS: 18 days    Time spent: 25 mins  Barb Merino, MD [facility]   If 7PM-7AM, please contact night-coverage

## 2020-12-18 NOTE — Telephone Encounter (Signed)
Received a staff msg from Dr. Irene Limbo to schedule an appt in 2-3 weeks for rectal cancer. James Coffey has been scheduled to see Dr. Irene Limbo on 8/1 at 11am. Pt is currently in the hospital. Letter mailed.

## 2020-12-18 NOTE — Progress Notes (Signed)
Physical Therapy Treatment Patient Details Name: James Coffey MRN: [id number] DOB: 26-Mar-1971 Today's Date: 12/18/2020    History of Present Illness 50 yo male admitted with fournier's gangrene. S/P I&D perirectal, scrotal, and abdomen. Post op hypotension. Also found to have rectal ca.    PT Comments    Patient making good progress with mobility and ambulated ~360' with no device and supervision/guarding for safety. Pt demonstrated improved posture and and balance throughout. Initiated functional strengthening with sit<>stands and hip strengthening with UE support at counter. Pt will continue to benefit from skilled PT interventions to progress mobility as able. Anticipate no needs at discharge    Follow Up Recommendations  No PT follow up;Supervision/Assistance - 24 hour     Equipment Recommendations  None recommended by PT (TBA on RW)    Recommendations for Other Services       Precautions / Restrictions Precautions Precautions: Fall Precaution Comments: wouonds-packing but no outside dressing-donned ABD and mesh briefs Restrictions Weight Bearing Restrictions: No    Mobility  Bed Mobility Overal bed mobility: Needs Assistance Bed Mobility: Supine to Sit     Supine to sit: HOB elevated;Supervision;Min guard     General bed mobility comments: min guard for safety, pt using rails and no assist needed    Transfers Overall transfer level: Needs assistance Equipment used: None Transfers: Sit to/from Stand Sit to Stand: Min guard;Supervision         General transfer comment: pt taking extra time and cues for power up, guarding for safety  Ambulation/Gait Ambulation/Gait assistance: Min guard;Supervision Gait Distance (Feet): 360 Feet Assistive device: None Gait Pattern/deviations: Step-through pattern;Decreased stride length;Trunk flexed Gait velocity: decr   General Gait Details: pt with more upright posture during gait and steady with no device. decreased  arm swing bil at start that improved throughout.   Stairs             Wheelchair Mobility    Modified Rankin (Stroke Patients Only)       Balance Overall balance assessment: Needs assistance Sitting-balance support: Feet supported Sitting balance-Leahy Scale: Good     Standing balance support: Single extremity supported;During functional activity Standing balance-Leahy Scale: Good                              Cognition Arousal/Alertness: Awake/alert Behavior During Therapy: WFL for tasks assessed/performed Overall Cognitive Status: Within Functional Limits for tasks assessed                                        Exercises Other Exercises Other Exercises: 2x5 reps sit<>stand no UE's Other Exercises: 10 reps bil LE's: hip abd, hip ext, marching, heel raises    General Comments        Pertinent Vitals/Pain Pain Assessment: Faces Faces Pain Scale: Hurts a little bit Pain Location: abdomen Pain Descriptors / Indicators: Discomfort;Guarding Pain Intervention(s): Limited activity within patient's tolerance;Monitored during session;Repositioned    Home Living                      Prior Function            PT Goals (current goals can now be found in the care plan section) Acute Rehab PT Goals Patient Stated Goal: less pain PT Goal Formulation: With patient Time For Goal Achievement: 12/24/20 Potential to Achieve Goals: Good  Progress towards PT goals: Progressing toward goals    Frequency    Min 3X/week      PT Plan Current plan remains appropriate    Co-evaluation              AM-PAC PT "6 Clicks" Mobility   Outcome Measure  Help needed turning from your back to your side while in a flat bed without using bedrails?: A Little Help needed moving from lying on your back to sitting on the side of a flat bed without using bedrails?: A Little Help needed moving to and from a bed to a chair (including a  wheelchair)?: A Little Help needed standing up from a chair using your arms (e.g., wheelchair or bedside chair)?: A Little Help needed to walk in hospital room?: A Little Help needed climbing 3-5 steps with a railing? : A Little 6 Click Score: 18    End of Session Equipment Utilized During Treatment: Gait belt Activity Tolerance: Patient tolerated treatment well Patient left: in chair;with call bell/phone within reach Nurse Communication: Mobility status PT Visit Diagnosis: Muscle weakness (generalized) (M62.81);Pain;Unsteadiness on feet (R26.81)     Time: 0919-8022 PT Time Calculation (min) (ACUTE ONLY): 26 min  Charges:  $Gait Training: 8-22 mins $Therapeutic Exercise: 8-22 mins                     [name], DPT Acute Rehabilitation Services Office [phone] Pager [phone]    [name] 12/18/2020, 2:56 PM

## 2020-12-18 NOTE — Consult Note (Signed)
[place] Nurse ostomy follow up  When I arrive to room patient is agitated, with comments about DC to home; "if no help I will stay here", "no insurance will keep me here" "I will do my care, don't mention my daughter". When I provide additional teaching materials bc daughter took the initial teaching materials home patient becomes a bit more agitated and wants to know who and why materials sent with her. I was able to calm patient.  It appears at time he does not comprehend all English but he is able to verbalize education he has received and demonstrate skills.   Stoma type/location: LUQ, loop colostomy  Stomal assessment/size: oval shaped 1 3/4" x 2 1/4"; pink, moist, slightly flush with skin Red rubber support rod removed today per this WOC nurse  Peristomal assessment: mucocutaneous separation (related to support rod); large hole  Treatment options for stomal/peristomal skin: 2" skin barrier ring  Output loose brown  Ostomy pouching: 2pc. 2 3/4" with 2" skin barrier ring  Education provided:  Patient has been empyting with staff; he can verbalize this step to [facility] nurse He is able to verbalize use of wick to clean spout.  He demonstrates use of lock and roll closure  He demonstrates but needs reinforcement with cutting new skin barrier and has difficulty with measuring.   PT IS VERY IMPULSIVE but engaged to provide hs own care. He is able to place new skin barrier with minimal assistance He is able to attach pouch to the skin barrier; choosing to put pouch off to angle despite me trying to explain rationale for placement downward.  Seems to be able to provide most care but is agitated some during the change    Enrolled patient in [place] Start Discharge program: Yes  I have explained the [employer] program; they will provide free pouches for the patient for 3 months however HE MUST make contact with them  I have provided contact information for him to call and reviewed  this with him twice during my visit.    I am sending him home with 8 pouches/skin barriers and barrier rings. He gets a bit irritated and using a louder voice to tell me  "You give me 10" You give me 10"  I have provided 8 which is more than we routinely seen with patients for DC to home  [place] Nurse will follow along with you for continued support with ostomy teaching and care [place] MSN, [place], [place], [place], [place]

## 2020-12-18 NOTE — Progress Notes (Signed)
Oncology short note   Outpatient medical oncology f/u scheduled on 01/06/2021 at 11 AM with Dr Irene Limbo.  James Coffey

## 2020-12-19 LAB — GLUCOSE, CAPILLARY
Glucose-Capillary: 127 mg/dL — ABNORMAL HIGH (ref 70–99)
Glucose-Capillary: 122 mg/dL — ABNORMAL HIGH (ref 70–99)

## 2020-12-19 MED ORDER — POTASSIUM CHLORIDE CRYS ER 20 MEQ PO TBCR: 20.0000 meq | EXTENDED_RELEASE_TABLET | Freq: Every day | ORAL | 0 refills | Status: DC

## 2020-12-19 MED ORDER — METHOCARBAMOL 500 MG PO TABS
500.0000 mg | ORAL_TABLET | Freq: Three times a day (TID) | ORAL | 0 refills | Status: DC | PRN
Start: 2020-12-19 — End: 2021-08-26

## 2020-12-19 MED ORDER — OXYCODONE HCL 5 MG PO TABS: 5.0000 mg | ORAL_TABLET | Freq: Four times a day (QID) | ORAL | 0 refills | Status: DC | PRN

## 2020-12-19 MED ORDER — HYDROCORTISONE 10 MG PO TABS: 10.0000 mg | ORAL_TABLET | Freq: Every day | ORAL | 0 refills | Status: DC

## 2020-12-19 MED ORDER — PANTOPRAZOLE SODIUM 40 MG PO TBEC: 40.0000 mg | DELAYED_RELEASE_TABLET | Freq: Every day | ORAL | 0 refills | Status: DC

## 2020-12-19 MED ORDER — ACETAMINOPHEN 500 MG PO TABS: 1000.0000 mg | ORAL_TABLET | Freq: Three times a day (TID) | ORAL | Status: DC | PRN

## 2020-12-19 MED ORDER — HYDROCORTISONE 5 MG PO TABS: 5.0000 mg | ORAL_TABLET | Freq: Every day | ORAL | 0 refills | Status: DC

## 2020-12-19 MED ORDER — GERHARDT'S BUTT CREAM
TOPICAL_CREAM | Freq: Every day | CUTANEOUS | Status: DC
  Filled 2020-12-19: qty 1

## 2020-12-19 MED ORDER — FERROUS SULFATE 325 (65 FE) MG PO TABS: 325.0000 mg | ORAL_TABLET | Freq: Every day | ORAL | 0 refills | Status: DC

## 2020-12-19 MED ORDER — METHOCARBAMOL 500 MG PO TABS: 500.0000 mg | ORAL_TABLET | Freq: Three times a day (TID) | ORAL | 0 refills | Status: DC | PRN

## 2020-12-19 MED ORDER — MIDODRINE HCL 5 MG PO TABS: 5.0000 mg | ORAL_TABLET | Freq: Three times a day (TID) | ORAL | 0 refills | Status: DC

## 2020-12-19 MED ORDER — BACITRACIN ZINC 500 UNIT/GM EX OINT: TOPICAL_OINTMENT | Freq: Every day | CUTANEOUS | 0 refills | Status: DC

## 2020-12-19 MED ORDER — HYDROCORTISONE 10 MG PO TABS: 10.0000 mg | ORAL_TABLET | Freq: Every day | ORAL | 0 refills | Status: AC

## 2020-12-19 MED ORDER — MIDODRINE HCL 5 MG PO TABS: 5.0000 mg | ORAL_TABLET | Freq: Three times a day (TID) | ORAL | 0 refills | Status: AC

## 2020-12-19 MED ORDER — HYDROCORTISONE 5 MG PO TABS: 5.0000 mg | ORAL_TABLET | Freq: Every day | ORAL | 0 refills | Status: AC

## 2020-12-19 MED ORDER — IBUPROFEN 600 MG PO TABS: 600.0000 mg | ORAL_TABLET | Freq: Three times a day (TID) | ORAL | 0 refills | Status: DC

## 2020-12-19 NOTE — Progress Notes (Addendum)
Progress Note  6 Days Post-Op  Subjective: Patient frustrated about figuring out home care. I instructed him again on wound care for abdominal wounds and reiterated that he could change these himself if needed. He would need help with perineal dressing since he is unable to see this one. He is tolerating diet and having good output from colostomy. He is getting comfortable with colostomy care.   Objective: Vital signs in last 24 hours: Temp:  [98.6 F (37 C)-99.3 F (37.4 C)] 99.3 F (37.4 C) (07/14 0652) Pulse Rate:  [71-79] 79 (07/14 0652) Resp:  [18] 18 (07/14 0652) BP: (96-108)/(58-70) 108/70 (07/14 0652) SpO2:  [98 %-100 %] 100 % (07/14 0652) Last BM Date: 12/19/20  Intake/Output from previous day: 07/13 0701 - 07/14 0700 In: 1440 [P.O.:1200; I.V.:240] Out: 3150 [Urine:2400; Stool:750] Intake/Output this shift: No intake/output data recorded.  PE: Gen: Awake and alert, pleasant Lungs: Normal rate and effort Heart: RRR Abd: soft, NT, ND, suprapubic and infraumbilical wounds stable, lateral wounds shallow, midline incision c/d/I with staples present, colostomy viable with stool in bag GU: foley present, perineal wound clean, barrier cream over buttocks and perineum   Lab Results:  Recent Labs    12/17/20 0430 12/18/20 0400  WBC 10.2 8.3  HGB 10.7* 9.8*  HCT 35.1* 31.9*  PLT 388 320   BMET Recent Labs    12/18/20 0400  NA 144  K 3.1*  CL 111  CO2 28  GLUCOSE 118*  BUN 17  CREATININE 0.48*  CALCIUM 8.6*   PT/INR No results for input(s): LABPROT, INR in the last 72 hours. CMP     Component Value Date/Time   NA 144 12/18/2020 0400   K 3.1 (L) 12/18/2020 0400   CL 111 12/18/2020 0400   CO2 28 12/18/2020 0400   GLUCOSE 118 (H) 12/18/2020 0400   BUN 17 12/18/2020 0400   CREATININE 0.48 (L) 12/18/2020 0400   CALCIUM 8.6 (L) 12/18/2020 0400   PROT 6.5 12/14/2020 0304   ALBUMIN 2.8 (L) 12/14/2020 0304   AST 15 12/14/2020 0304   ALT 16 12/14/2020  0304   ALKPHOS 59 12/14/2020 0304   BILITOT 0.8 12/14/2020 0304   GFRNONAA >60 12/18/2020 0400   Lipase     Component Value Date/Time   LIPASE 22 11/03/2020 1714       Studies/Results: No results found.  Anti-infectives: Anti-infectives (From admission, onward)    Start     Dose/Rate Route Frequency Ordered Stop   12/04/20 2200  ceFEPIme (MAXIPIME) 2 g in sodium chloride 0.9 % 100 mL IVPB  Status:  Discontinued        2 g 200 mL/hr over 30 Minutes Intravenous Every 8 hours 12/04/20 1327 12/14/20 1352   12/04/20 2200  metroNIDAZOLE (FLAGYL) IVPB 500 mg  Status:  Discontinued        500 mg 100 mL/hr over 60 Minutes Intravenous Every 12 hours 12/04/20 1327 12/14/20 1352   12/02/20 1400  linezolid (ZYVOX) IVPB 600 mg  Status:  Discontinued        600 mg 300 mL/hr over 60 Minutes Intravenous Every 12 hours 12/02/20 1138 12/04/20 1327   11/30/20 1500  vancomycin (VANCOREADY) IVPB 1250 mg/250 mL  Status:  Discontinued        1,250 mg 166.7 mL/hr over 90 Minutes Intravenous Every 12 hours 11/30/20 0606 12/02/20 1138   11/30/20 0600  piperacillin-tazobactam (ZOSYN) IVPB 3.375 g  Status:  Discontinued        3.375 g  12.5 mL/hr over 240 Minutes Intravenous Every 8 hours 11/30/20 0237 12/04/20 1327   11/30/20 0403  piperacillin-tazobactam (ZOSYN) 3.375 GM/50ML IVPB       Note to Pharmacy: James Coffey   : cabinet override      11/30/20 0403 12/01/20 0206   11/30/20 0230  clindamycin (CLEOCIN) IVPB 600 mg  Status:  Discontinued        600 mg 100 mL/hr over 30 Minutes Intravenous  Once 11/30/20 0219 11/30/20 0237   11/30/20 0000  ceFEPIme (MAXIPIME) 2 g in sodium chloride 0.9 % 100 mL IVPB        2 g 200 mL/hr over 30 Minutes Intravenous  Once 11/29/20 2347 11/30/20 0033   11/30/20 0000  vancomycin (VANCOREADY) IVPB 1500 mg/300 mL        1,500 mg 150 mL/hr over 120 Minutes Intravenous  Once 11/29/20 2347 11/30/20 0300   11/29/20 2345  vancomycin (VANCOCIN) IVPB 1000 mg/200 mL  premix  Status:  Discontinued        1,000 mg 200 mL/hr over 60 Minutes Intravenous  Once 11/29/20 2340 11/29/20 2347   11/29/20 2345  ceFEPIme (MAXIPIME) 1 g in sodium chloride 0.9 % 100 mL IVPB  Status:  Discontinued        1 g 200 mL/hr over 30 Minutes Intravenous  Once 11/29/20 2340 11/29/20 2347        Assessment/Plan POD19 s/p EUA, rectal wall bx, I&D of perirectal, scrotal, penile and abdominal regions for Fournier's gangrene - Dr. Johney Maine w/ Dr. Alinda Money - 11/30/20 POD18 s/p I&D of perirectal, scrotal, penile and abdominal regions for Fournier's gangrene, rectal stricturing/possible cancer - Dr. Johney Maine and Dr. Alinda Money - 12/01/20 POD15 s/p EUA w/ I&D for Fournier's gangrene - Dr. Hassell Done and Dr. Alinda Money - 12/04/20 - Wounds clean. No further debridement indicated at this time POD6 s/p lap converted to open diverting loop colostomy Dr. Harlow Asa 12/13/2020   -Continue packing wounds / dressing changes.  Now that he is diverted, can transition to just daily in order to endorse micro debridement of the accumulating fibrinous exudate - stop desitin and can use gerhardt's butt cream or barrier cream for periwound . Avoid tape (use mesh undergarment). Keep periwound clean and dry - E. Coli, Enterobacter cloacae, Bacteroides Ovatus, Beta Lactamase + - completed appropriate course of antibiotics - Foley per urology - will need to follow up with Dr. Alinda Money  - Appreciate CCM/TRH assistance  Adenocarcinoma of the rectum Rectal Stricturing - Noted from bx of rectal wall - GI consulted for colonoscopy - recommending waiting until perineal wounds have healed - S/p fecal diversion with loop colostomy 7/8 - WOC to start colostomy teaching - CEA 16.7 - Oncology consulted, follow up as outpatient  - Has had CT A/P during admission. CT chest 7/9 shows no evidence of metastatic disease. - good colostomy output   FEN - Reg VTE - SCDs, Lovenox ID - Antibiotics completed Foley - in place   Shock -  resolved Tobacco use ABL anemia w/ + FOBT - hgb stable  Patient is stable for discharge from a surgical standpoint if he has appropriate help for daily dressing changes at home. He has surgical follow up in [facility] already and wound care instructions. Referral has been sent for ostomy clinic and Rx for pain meds.   LOS: 19 days    Norm Parcel, [facility] 12/19/2020, 11:27 AM Please see Amion for pager number during day hours 7:00am-4:30pm

## 2020-12-19 NOTE — Discharge Summary (Signed)
Physician Discharge Summary  James Coffey NWG:[id number] DOB: 12/29/1970 DOA: 11/29/2020  PCP: Pcp, No  Admit date: 11/29/2020 Discharge date: 12/19/2020  Admitted From: home  Disposition:  home   Recommendations for Outpatient Follow-up:  Follow up with PCP in 1-2 weeks Please obtain BMP/CBC/ Mg/ phos in one week Follow up with surgery/ Cancer center as scheduled.   Home Health:wound care   Equipment/Devices:dressing supplies    Discharge Condition: stable   CODE STATUS:Full Code  Diet recommendation: regular diet , nutritional supplements  Discharge Summary: This 50 years old male with PMH significant for tobacco use, history of rectal cancer presented in the ED with septic shock secondary to Fournier's gangrene of the perirectal, scrotal, penile and abdominal regions.  Patient was initially admitted in the ICU,  he was requiring vasopressors to maintain blood pressure.  Blood pressure has improved.  He is off pressors now,  he underwent multiple incision and drainage and surgical debridements,  general surgery and urology is following.  Patient is off antibiotics now.  He was started on midodrine for blood pressure support.  He underwent laparoscopy and descending loop colostomy.   Significant hospital events. 6/25 I&D and debridement. Central line placement 6/26 Second I&D 6/28 on vasopressors. 6/29 third I&D 6/30 remained on levo. received one unit PRBC overnight for hgb 7.1 7/1 PICC line placed and CVC removed overnight, pressor requirement decreased with addition of midodrine D/c'd levophed. 7/8: Diagnostic laparoscopy, open descending loop colostomy 7/9: CT chest without evidence of metastatic disease.   Septic shock secondary to Fournier's gangrene: Extensive ICU care and vasopressor requirement.  All improved now.   Patient is on prolonged steroids, currently on tapering dose.  Also remains on midodrine.  We will gradually taper off. Completed antibiotic therapy for E.  coli and multiple bacteria's.   Local wound care.  Trying to find out wound care at home before discharge.   Pain medications were prescribed.  Was not able to receive any home health nursing support, however patient has a family member with her nursing training they will provide dressing changes at home. Dressing instructions placed.   Adenocarcinoma of rectum. It was noted during debridement of Fournier's gangrene. GI following, recommended colonoscopy once perineal wounds are healed. CT chest completed without metastatic disease. He underwent diagnostic laparoscopy, open descending loop colostomy 12/13/20 , tolerated well. Oncology consulted, they will schedule outpatient follow-up for chemotherapy. Continue Foley catheter per urology due to perineal wounds. Patient will need surgical staples removal in 5 days as per surgery. There is follow-up is scheduled at surgical office next week for recheck and staple removal.   Tobacco use disorder: Nicotine patch.   Normocytic normochromic anemia. Hb remained stable,  continue iron supplementation.  Patient has medically stabilized.  He has extensive wounds that needs care at home and this was talked to the family members.  He is stable to discharge.  He will have follow-up with surgery, oncology and gastroenterology.  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 [facility])    Discharge Instructions  Discharge Instructions     Ambulatory referral to Wound Clinic   Complete by: As directed  Loop colostomy care   Call MD for:  redness, tenderness, or signs of infection (pain, swelling, redness, odor or  green/yellow discharge around incision site)   Complete by: As directed    Call MD for:  severe uncontrolled pain   Complete by: As directed    Call MD for:  temperature >100.4   Complete by: As directed    Diet - low sodium heart healthy   Complete by: As directed    Discharge wound care:   Complete by: As directed    Cleanse with NS, pat gently dry. Fill defects with saline moistened roll gauze, top with dry gauze, ABD pads and secure with tape. Change PRN for soiling, otherwise twice daily.   Perineal wound, suprapubic and infra umbilical abdominal wounds as well. DO NOT pack lateral abdominal wounds - apply dry dressings to these.   Increase activity slowly   Complete by: As directed       Allergies as of 12/19/2020   No Known Allergies      Medication List     TAKE these medications    acetaminophen 500 MG tablet Commonly known as: TYLENOL Take 2 tablets (1,000 mg total) by mouth every 8 (eight) hours as needed for mild pain or fever. What changed:  medication strength how much to take when to take this reasons to take this   bacitracin ointment Apply topically daily. Apply to lateral flank wounds   ferrous sulfate 325 (65 FE) MG tablet Take 1 tablet (325 mg total) by mouth daily with breakfast. Start taking on: December 20, 2020   hydrocortisone 10 MG tablet Commonly known as: CORTEF Take 1 tablet (10 mg total) by mouth daily for 3 days. Start taking on: December 20, 2020   hydrocortisone 5 MG tablet Commonly known as: CORTEF Take 1 tablet (5 mg total) by mouth daily for 3 days. Start taking on: December 24, 2020   ibuprofen 600 MG tablet Commonly known as: ADVIL Take 1 tablet (600 mg total) by mouth 3 (three) times daily. What changed:  medication strength how much to take when to take this reasons to take this   methocarbamol 500 MG tablet Commonly known as: ROBAXIN Take 1 tablet (500 mg total) by mouth every 8 (eight) hours as needed for muscle spasms.    midodrine 5 MG tablet Commonly known as: PROAMATINE Take 1 tablet (5 mg total) by mouth 3 (three) times daily with meals.   oxyCODONE 5 MG immediate release tablet Commonly known as: Oxy IR/ROXICODONE Take 1-2 tablets (5-10 mg total) by mouth every 6 (six) hours as needed for severe pain or moderate pain (5mg  for moderate pain, 10mg  for severe pain).   pantoprazole 40 MG tablet Commonly known as: PROTONIX Take 1 tablet (40 mg total) by mouth at bedtime.   potassium chloride SA 20 MEQ tablet Commonly known as: KLOR-CON Take 1 tablet (20 mEq total) by mouth daily for 14 days.               Discharge Care Instructions  (From admission, onward)           Start     Ordered   12/19/20 0000  Discharge wound care:       Comments: Cleanse with NS, pat gently dry. Fill defects with saline moistened roll gauze, top with dry gauze, ABD pads and secure with tape. Change PRN for soiling, otherwise twice daily.   Perineal wound, suprapubic and infra umbilical abdominal wounds as well. DO NOT  pack lateral abdominal wounds - apply dry dressings to these.   12/19/20 1139            Follow-up Information     Primary Care at [facility] Follow up.   Specialty: Family Medicine Why: Friday, 01/31/2021 1:30 PM Contact information: [address] [place]        [name], MD Follow up on 12/24/2020.   Specialties: General Surgery, Colon and Rectal Surgery Why: Your appointment is at 10:10 AM.  Be at the office 30 minutes early for check in.  Bring photo ID, insurance, and interpreter with you.  They will remove your staples at this appointment too. Contact information: [place] [address] [phone]         [name], MD. Call.   Specialty: Urology Why: And follow up regarding foley care/removal. Contact information: [place] [address] [phone]                 No Known Allergies  Consultations: PCCM General surgery Gastroenterology Oncology   Procedures/Studies: CT CHEST W CONTRAST  Result Date: 12/14/2020 CLINICAL DATA:  50 year old male with colorectal cancer staging. EXAM: CT CHEST WITH CONTRAST TECHNIQUE: Multidetector CT imaging of the chest was performed during intravenous contrast administration. CONTRAST:  60mL OMNIPAQUE IOHEXOL 300 MG/ML  SOLN COMPARISON:  Chest radiograph dated 11/30/2020. CT abdomen pelvis dated 11/30/2020. FINDINGS: Evaluation of this exam is limited due to respiratory motion artifact. Cardiovascular: Borderline cardiomegaly. Three vessel coronary vascular calcification. No pericardial effusion. Mild atherosclerotic calcification of the thoracic aorta. No aneurysmal dilatation or dissection. The origins of the great vessels of the aortic arch appear patent as visualized. The central pulmonary arteries are grossly unremarkable for the degree of opacification. Right-sided PICC with tip in the right atrium. Mediastinum/Nodes: No hilar or mediastinal adenopathy. The esophagus and thyroid gland are grossly unremarkable. No mediastinal fluid collection. Lungs/Pleura: Small bilateral pleural effusions with associated partial compressive atelectasis of the lower lobes. Pneumonia is not excluded clinical correlation is recommended. Mild paraseptal emphysema. There is no pneumothorax. Mucus secretions noted in the right mainstem bronchus. The airways remain patent. Upper Abdomen: Partially visualized pneumoperitoneum. In the absence of recent procedure findings concerning for bowel perforation. Clinical correlation is recommended. CT of the abdomen pelvis may provide better evaluation if clinically indicated. Musculoskeletal: Severe degenerative changes of the left shoulder. No acute osseous pathology. IMPRESSION: 1. No CT evidence of metastatic disease in the chest. 2. Small bilateral pleural effusions with associated  partial compressive atelectasis of the lower lobes. Pneumonia is not excluded. Clinical correlation is recommended. 3. Partially visualized pneumoperitoneum. This may be related to recent surgery. Clinical correlation is recommended. 4. Aortic Atherosclerosis (ICD10-I70.0) and Emphysema (ICD10-J43.9). These results were called by telephone at the time of interpretation on 12/14/2020 at 9:20 pm to the patient's nurse, worth, who verbally acknowledged these results. Electronically Signed   By: Anner Crete M.D.   On: 12/14/2020 21:23   CT ABDOMEN PELVIS W CONTRAST  Result Date: 11/30/2020 CLINICAL DATA:  Lower abdominal pain.  Recent perianal abscess. EXAM: CT ABDOMEN AND PELVIS WITH CONTRAST TECHNIQUE: Multidetector CT imaging of the abdomen and pelvis was performed using the standard protocol following bolus administration of intravenous contrast. CONTRAST:  110mL OMNIPAQUE IOHEXOL 300 MG/ML  SOLN COMPARISON:  Abdominopelvic CT 11/03/2020 FINDINGS: Lower chest: No acute airspace disease or pleural effusion. Heart is normal in size. Hepatobiliary: Borderline hepatic steatosis. No  focal liver lesion. Gallbladder physiologically distended, no calcified stone. No biliary dilatation. Pancreas: No ductal dilatation or inflammation. Mild motion artifact through the pancreas. Spleen: Normal in size without focal abnormality. Adrenals/Urinary Tract: Minimal left adrenal thickening. No dominant adrenal nodule. No hydronephrosis or perinephric edema. Homogeneous renal enhancement with symmetric excretion on delayed phase imaging. Small cyst in the medial right kidney is unchanged. Urinary bladder is physiologically distended without wall thickening. Stomach/Bowel: Inflammation and soft tissue air, no drainable collection mild rectal wall thickening. No additional sites of colonic wall thickening. Moderate colonic stool burden. Appendix not definitively visualized, no evidence of appendicitis. Decompressed small bowel  without inflammation. Unremarkable stomach. Vascular/Lymphatic: Moderate aortic atherosclerosis with calcified noncalcified atheromatous plaque. Patent portal vein. Patent splenic veins. There is an irregular presacral nodule measuring 2.2 cm, series 2, image 74, this may represent an enlarged lymph node but is nonspecific. There prominent bilateral inguinal lymph nodes. Reproductive: Peroneal air previously localized to the right buttock and peritoneal and has spread anteriorly into the base of the penis and both scrotum. Patchy air seen within both scrotal sacs, right greater than left. Minimal air tracks adjacent to the left aspect of the prostate. Other: Air in soft tissue inflammation about the right buttock has progressed, with air-fluid collection in the right perianal region. Patchy soft tissue gas extends anteriorly into the perineum into both scrotum, base of the penis, and anterior abdominal wall. Few foci of soft tissue gas track adjacent to the prostate gland. No frank pneumoperitoneum or ascites. Musculoskeletal: There are no acute or suspicious osseous abnormalities. Occasional bone islands in the pelvis. IMPRESSION: 1. Worsening perineal inflammation, including right peri-rectal. Significant progression of soft tissue gas, now extending anteriorly into the perineum, base of the penis, both scrotal sacs and anterior abdominal wall. Findings are highly suspicious for necrotizing soft tissue infection, and urgent surgical consult is recommended. 2. Irregular presacral nodule measuring 2.2 cm may represent an enlarged lymph node but is nonspecific. Recommend follow-up after course of treatment. There is rectal wall thickening which may be inflammatory, however neoplasm is not excluded. Aortic Atherosclerosis (ICD10-I70.0). These results were called by telephone at the time of interpretation on 11/30/2020 at 2:01 am to provider Lattie Haw, who will convery the results urgently to [facility] , who verbally  acknowledged these results. Electronically Signed   By: Keith Rake M.D.   On: 11/30/2020 02:02   DG CHEST PORT 1 VIEW  Result Date: 11/30/2020 CLINICAL DATA:  Right central line placement EXAM: PORTABLE CHEST 1 VIEW COMPARISON:  March 25, 2007 FINDINGS: A right central line is been placed in the interval with the distal tip in the central SVC. No pneumothorax. The lower chest is not completely included on today's film, particularly on the left. Mild increased haziness in the left upper lobe. No other acute abnormalities. IMPRESSION: 1. A right central line has been placed in the interval with the distal tip in the SVC. No pneumothorax. 2. Limited study as the lung bases were not completely included and the patient is rotated. There is mild haziness in the left upper lung which could be due to positioning or subtle infiltrate. A better positioned PA and lateral chest x-ray could better evaluate. Electronically Signed   By: Dorise Bullion III M.D   On: 11/30/2020 09:07   ECHOCARDIOGRAM COMPLETE  Result Date: 12/06/2020    ECHOCARDIOGRAM REPORT   Patient Name:   RUMEAL CULLIPHER Date of Exam: 12/06/2020 Medical Rec #:  [id number]    Height:  72.0 in Accession #:    [id number]   Weight:       165.6 lb Date of Birth:  1970-07-27    BSA:          1.966 m Patient Age:    26 years     BP:           107/64 mmHg Patient Gender: M            HR:           102 bpm. Exam Location:  Inpatient Procedure: 2D Echo, Cardiac Doppler and Color Doppler Indications:    I42.9 Cardiomyopathy (unspecified)  History:        Patient has no prior history of Echocardiogram examinations.  Sonographer:    Jonelle Sidle Dance Referring Phys: [id number] [place]  1. Left ventricular ejection fraction, by estimation, is 55 to 60%. The left ventricle has normal function. The left ventricle has no regional wall motion abnormalities. Left ventricular diastolic parameters were normal.  2. Right ventricular systolic function is  normal. The right ventricular size is normal.  3. The mitral valve is grossly normal. Trivial mitral valve regurgitation.  4. The aortic valve is grossly normal. Aortic valve regurgitation is not visualized. FINDINGS  Left Ventricle: Left ventricular ejection fraction, by estimation, is 55 to 60%. The left ventricle has normal function. The left ventricle has no regional wall motion abnormalities. The left ventricular internal cavity size was normal in size. There is  no left ventricular hypertrophy. Left ventricular diastolic parameters were normal. Right Ventricle: The right ventricular size is normal. Right vetricular wall thickness was not well visualized. Right ventricular systolic function is normal. Left Atrium: Left atrial size was normal in size. Right Atrium: Right atrial size was normal in size. Pericardium: There is no evidence of pericardial effusion. Mitral Valve: The mitral valve is grossly normal. Trivial mitral valve regurgitation. Tricuspid Valve: The tricuspid valve is normal in structure. Tricuspid valve regurgitation is trivial. Aortic Valve: The aortic valve is grossly normal. Aortic valve regurgitation is not visualized. Pulmonic Valve: The pulmonic valve was normal in structure. Pulmonic valve regurgitation is not visualized. Aorta: The aortic root and ascending aorta are structurally normal, with no evidence of dilitation. IAS/Shunts: The atrial septum is grossly normal.  LEFT VENTRICLE PLAX 2D LVIDd:         5.00 cm  Diastology LVIDs:         3.70 cm  LV e' medial:    10.60 cm/s LV PW:         1.00 cm  LV E/e' medial:  6.5 LV IVS:        1.10 cm  LV e' lateral:   16.40 cm/s LVOT diam:     2.00 cm  LV E/e' lateral: 4.2 LV SV:         58 LV SV Index:   29 LVOT Area:     3.14 cm  RIGHT VENTRICLE             IVC RV Basal diam:  3.20 cm     IVC diam: 1.70 cm RV Mid diam:    2.10 cm RV S prime:     14.10 cm/s TAPSE (M-mode): 2.3 cm LEFT ATRIUM             Index       RIGHT ATRIUM           Index  LA diam:        3.50  cm 1.78 cm/m  RA Area:     15.00 cm LA Vol (A2C):   64.5 ml 32.81 ml/m RA Volume:   37.90 ml  19.28 ml/m LA Vol (A4C):   42.9 ml 21.82 ml/m LA Biplane Vol: 53.1 ml 27.01 ml/m  AORTIC VALVE LVOT Vmax:   125.00 cm/s LVOT Vmean:  75.900 cm/s LVOT VTI:    0.184 m  AORTA Ao Root diam: 3.00 cm Ao Asc diam:  3.20 cm MITRAL VALVE MV Area (PHT): 4.68 cm    SHUNTS MV Decel Time: 162 msec    Systemic VTI:  0.18 m MV E velocity: 69.20 cm/s  Systemic Diam: 2.00 cm MV A velocity: 58.10 cm/s MV E/A ratio:  1.19 Mertie Moores MD Electronically signed by Mertie Moores MD Signature Date/Time: 12/06/2020/2:58:19 PM    Final    [place] EKG SITE RITE  Result Date: 12/05/2020 If Site Rite image not attached, placement could not be confirmed due to current cardiac rhythm.  (Echo, Carotid, EGD, Colonoscopy, ERCP)    Subjective: Patient seen and examined in the morning rounds with surgical team.  Patient is walking around in the hallway.  He was very frustrated and desperate that we were not able to secure any home health nurse for him.  He does not want to stay in the hospital. Patient's family contacted our team, family with nursing experience and able to provide dressing changes on his perineal wounds.  Patient is happy to go home.   Discharge Exam: Vitals:   12/18/20 2145 12/19/20 0652  BP: (!) 96/58 108/70  Pulse: 71 79  Resp: 18 18  Temp: 98.6 F (37 C) 99.3 F (37.4 C)  SpO2: 99% 100%   Vitals:   12/18/20 0527 12/18/20 1331 12/18/20 2145 12/19/20 0652  BP: 110/72 (!) 107/58 (!) 96/58 108/70  Pulse: 70 71 71 79  Resp: 18 18 18 18   Temp: 98.9 F (37.2 C) 98.9 F (37.2 C) 98.6 F (37 C) 99.3 F (37.4 C)  TempSrc: Oral Oral Oral Oral  SpO2: 99% 98% 99% 100%  Weight:      Height:       General: Fairly comfortable.  Well-built.  On room air. Cardiovascular: S1-S2 normal. Respiratory: Bilateral clear. Gastrointestinal: Soft and nontender. Patient has midline surgical incision  with staples intact.  There is a small opening on the inferior aspect of the wound that is packed.  He has multiple ports with intact staples. Colostomy left lower quadrant with loose stool. Patient has clean perineal wound extending from perineum to scrotum, dressing intact.    The results of significant diagnostics from this hospitalization (including imaging, microbiology, ancillary and laboratory) are listed below for reference.     Microbiology: No results found for this or any previous visit (from the past 240 hour(s)).   Labs: BNP (last 3 results) No results for input(s): BNP in the last 8760 hours. Basic Metabolic Panel: Recent Labs  Lab 12/13/20 0416 12/14/20 0304 12/15/20 0350 12/16/20 0300 12/18/20 0400  NA 139 139 140 139 144  K 4.0 3.8 3.4* 3.4* 3.1*  CL 107 103 105 103 111  CO2 27 30 28 27 28   GLUCOSE 128* 130* 103* 110* 118*  BUN 16 15 13 10 17   CREATININE 0.50* 0.58* 0.46* 0.50* 0.48*  CALCIUM 8.9 9.1 8.8* 8.8* 8.6*  MG 2.2 2.2  --   --  2.0  PHOS 3.3 3.8  --   --  3.4   Liver Function Tests: Recent Labs  Lab 12/14/20 0304  AST 15  ALT 16  ALKPHOS 59  BILITOT 0.8  PROT 6.5  ALBUMIN 2.8*   No results for input(s): LIPASE, AMYLASE in the last 168 hours. No results for input(s): AMMONIA in the last 168 hours. CBC: Recent Labs  Lab 12/13/20 0416 12/14/20 0304 12/15/20 0350 12/17/20 0430 12/18/20 0400  WBC 10.8* 12.9* 10.9* 10.2 8.3  HGB 9.9* 10.3* 10.8* 10.7* 9.8*  HCT 33.5* 33.5* 35.6* 35.1* 31.9*  MCV 97.1 94.1 95.2 94.1 95.5  PLT 478* 488* 445* 388 320   Cardiac Enzymes: No results for input(s): CKTOTAL, CKMB, CKMBINDEX, TROPONINI in the last 168 hours. BNP: Invalid input(s): POCBNP CBG: Recent Labs  Lab 12/18/20 1153 12/18/20 1652 12/18/20 2110 12/19/20 0752 12/19/20 1146  GLUCAP 109* 147* 230* 127* 122*   D-Dimer No results for input(s): DDIMER in the last 72 hours. Hgb A1c No results for input(s): HGBA1C in the last 72  hours. Lipid Profile No results for input(s): CHOL, HDL, LDLCALC, TRIG, CHOLHDL, LDLDIRECT in the last 72 hours. Thyroid function studies No results for input(s): TSH, T4TOTAL, T3FREE, THYROIDAB in the last 72 hours.  Invalid input(s): FREET3 Anemia work up No results for input(s): VITAMINB12, FOLATE, FERRITIN, TIBC, IRON, RETICCTPCT in the last 72 hours. Urinalysis    Component Value Date/Time   COLORURINE YELLOW 11/30/2020 0200   APPEARANCEUR HAZY (A) 11/30/2020 0200   LABSPEC 1.034 (H) 11/30/2020 0200   PHURINE 5.0 11/30/2020 0200   GLUCOSEU NEGATIVE 11/30/2020 0200   HGBUR NEGATIVE 11/30/2020 0200   BILIRUBINUR NEGATIVE 11/30/2020 0200   KETONESUR NEGATIVE 11/30/2020 0200   PROTEINUR NEGATIVE 11/30/2020 0200   NITRITE NEGATIVE 11/30/2020 0200   LEUKOCYTESUR NEGATIVE 11/30/2020 0200   Sepsis Labs Invalid input(s): PROCALCITONIN,  WBC,  LACTICIDVEN Microbiology No results found for this or any previous visit (from the past 240 hour(s)).   Time coordinating discharge: 35 minutes  SIGNED:   Barb Merino, MD  [facility] 12/19/2020, 12:35 PM

## 2020-12-19 NOTE — Plan of Care (Signed)
Instructions were reviewed with patient. All questions were answered. Patient was transported to main entrance by wheelchair. ° °

## 2021-01-06 ENCOUNTER — Inpatient Hospital Stay: Payer: Medicaid Other | Attending: Hematology | Admitting: Hematology

## 2021-01-06 ENCOUNTER — Inpatient Hospital Stay: Payer: Medicaid Other

## 2021-01-06 ENCOUNTER — Other Ambulatory Visit: Payer: Self-pay

## 2021-01-06 VITALS — BP 120/84 | HR 82 | Temp 98.5°F | Resp 19 | Ht 73.0 in | Wt 147.4 lb

## 2021-01-06 DIAGNOSIS — C20 Malignant neoplasm of rectum: Secondary | ICD-10-CM

## 2021-01-06 DIAGNOSIS — C775 Secondary and unspecified malignant neoplasm of intrapelvic lymph nodes: Secondary | ICD-10-CM

## 2021-01-06 DIAGNOSIS — Z79899 Other long term (current) drug therapy: Secondary | ICD-10-CM | POA: Insufficient documentation

## 2021-01-06 DIAGNOSIS — Z5111 Encounter for antineoplastic chemotherapy: Secondary | ICD-10-CM | POA: Insufficient documentation

## 2021-01-06 LAB — CBC WITH DIFFERENTIAL/PLATELET
Abs Immature Granulocytes: 0.13 10*3/uL — ABNORMAL HIGH (ref 0.00–0.07)
Basophils Absolute: 0.1 10*3/uL (ref 0.0–0.1)
Basophils Relative: 1 %
Eosinophils Absolute: 0.3 10*3/uL (ref 0.0–0.5)
Eosinophils Relative: 2 %
HCT: 39.3 % (ref 39.0–52.0)
Hemoglobin: 12.7 g/dL — ABNORMAL LOW (ref 13.0–17.0)
Immature Granulocytes: 1 %
Lymphocytes Relative: 28 %
Lymphs Abs: 5 10*3/uL — ABNORMAL HIGH (ref 0.7–4.0)
MCH: 28.1 pg (ref 26.0–34.0)
MCHC: 32.3 g/dL (ref 30.0–36.0)
MCV: 86.9 fL (ref 80.0–100.0)
Monocytes Absolute: 1.2 10*3/uL — ABNORMAL HIGH (ref 0.1–1.0)
Monocytes Relative: 7 %
Neutro Abs: 10.8 10*3/uL — ABNORMAL HIGH (ref 1.7–7.7)
Neutrophils Relative %: 61 %
Platelets: 478 10*3/uL — ABNORMAL HIGH (ref 150–400)
RBC: 4.52 MIL/uL (ref 4.22–5.81)
RDW: 14.6 % (ref 11.5–15.5)
WBC: 17.5 10*3/uL — ABNORMAL HIGH (ref 4.0–10.5)
nRBC: 0 % (ref 0.0–0.2)

## 2021-01-06 LAB — IRON AND TIBC
Iron: 48 ug/dL (ref 42–163)
Saturation Ratios: 15 % — ABNORMAL LOW (ref 20–55)
TIBC: 326 ug/dL (ref 202–409)
UIBC: 278 ug/dL (ref 117–376)

## 2021-01-06 LAB — VITAMIN B12: Vitamin B-12: 267 pg/mL (ref 180–914)

## 2021-01-06 LAB — CMP (CANCER CENTER ONLY)
ALT: 18 U/L (ref 0–44)
AST: 10 U/L — ABNORMAL LOW (ref 15–41)
Albumin: 3.3 g/dL — ABNORMAL LOW (ref 3.5–5.0)
Alkaline Phosphatase: 133 U/L — ABNORMAL HIGH (ref 38–126)
Anion gap: 10 (ref 5–15)
BUN: 7 mg/dL (ref 6–20)
CO2: 26 mmol/L (ref 22–32)
Calcium: 10.5 mg/dL — ABNORMAL HIGH (ref 8.9–10.3)
Chloride: 105 mmol/L (ref 98–111)
Creatinine: 0.75 mg/dL (ref 0.61–1.24)
GFR, Estimated: >60 mL/min (ref >60)
Glucose, Bld: 107 mg/dL — ABNORMAL HIGH (ref 70–99)
Potassium: 4 mmol/L (ref 3.5–5.1)
Sodium: 141 mmol/L (ref 135–145)
Total Bilirubin: 0.4 mg/dL (ref 0.3–1.2)
Total Protein: 8.1 g/dL (ref 6.5–8.1)

## 2021-01-06 LAB — CEA (IN HOUSE-CHCC): CEA (CHCC-In House): 52.55 ng/mL — ABNORMAL HIGH (ref 0.00–5.00)

## 2021-01-06 LAB — FERRITIN: Ferritin: 205 ng/mL (ref 24–336)

## 2021-01-06 NOTE — Progress Notes (Signed)
I spoke with James Coffey and his daughter, [name] with the aid of an interpreter.  I explained my role as the GI Oncology Nurse navigator and reviewed his current plan.  I provided my direct contact information.  All questions were answered.  They verbalized understanding.  [name] will be providing transportation to his appts.

## 2021-01-07 ENCOUNTER — Other Ambulatory Visit: Payer: Self-pay

## 2021-01-07 ENCOUNTER — Encounter: Payer: Self-pay | Admitting: *Deleted

## 2021-01-07 DIAGNOSIS — C20 Malignant neoplasm of rectum: Secondary | ICD-10-CM

## 2021-01-07 DIAGNOSIS — C775 Secondary and unspecified malignant neoplasm of intrapelvic lymph nodes: Secondary | ICD-10-CM

## 2021-01-07 NOTE — Progress Notes (Signed)
[place] Work  Clinical Social Work received referral from medical oncology due to patient being uninsured and recent hospital stay.  CSW spoke to Trinidad Curet with first source/med assist.  Ms. Trenton Gammon reported that the patient was screened while in-patient and they are waiting on a return call from patient and/or patients daughter.  Before Medicaid application can be submitted "patient would need to agree to spend down bank account to meet [place] guidelines to be eligible for benefits."  Ms. Trenton Gammon also reported a representative called patient today and left a message requesting a return call.  CSW will follow up with patient at his next [facility] visit to explore additional resources.     Johnnye Lana, MSW, LCSW, OSW-C Clinical Social Worker [facility] [phone]

## 2021-01-08 ENCOUNTER — Telehealth: Payer: Self-pay | Admitting: Hematology

## 2021-01-08 NOTE — Telephone Encounter (Signed)
Left message with follow-up appointments per 8/1 los. 

## 2021-01-08 NOTE — Progress Notes (Signed)
I spoke with James Coffey daughter James Coffey.  I let her know I schedule his James for 8/7 at 1300.  He is to arrive at 1230 and register via the [facility] ED registration office.  She verbalized understanding.

## 2021-01-09 ENCOUNTER — Other Ambulatory Visit (HOSPITAL_COMMUNITY): Payer: Self-pay

## 2021-01-09 ENCOUNTER — Encounter: Payer: Self-pay | Admitting: Hematology

## 2021-01-09 MED ORDER — ONDANSETRON HCL 8 MG PO TABS
8.0000 mg | ORAL_TABLET | Freq: Two times a day (BID) | ORAL | 1 refills | Status: DC | PRN
  Filled 2021-01-09 – 2021-01-20 (×2): qty 30, 15d supply, fill #0

## 2021-01-09 MED ORDER — LORAZEPAM 0.5 MG PO TABS
0.5000 mg | ORAL_TABLET | Freq: Four times a day (QID) | ORAL | 0 refills | Status: DC | PRN
  Filled 2021-01-09 – 2021-01-20 (×2): qty 30, 8d supply, fill #0

## 2021-01-09 MED ORDER — CAPECITABINE 500 MG PO TABS
850.0000 mg/m2 | ORAL_TABLET | Freq: Two times a day (BID) | ORAL | 3 refills | Status: AC
  Filled 2021-01-09 – 2021-01-16 (×2): qty 84, 14d supply, fill #0

## 2021-01-09 MED ORDER — DEXAMETHASONE 4 MG PO TABS
8.0000 mg | ORAL_TABLET | Freq: Every day | ORAL | 1 refills | Status: DC
  Filled 2021-01-09 – 2021-01-20 (×2): qty 30, 15d supply, fill #0

## 2021-01-09 MED ORDER — PROCHLORPERAZINE MALEATE 10 MG PO TABS
10.0000 mg | ORAL_TABLET | Freq: Four times a day (QID) | ORAL | 1 refills | Status: DC | PRN
  Filled 2021-01-09 – 2021-01-20 (×2): qty 30, 8d supply, fill #0

## 2021-01-09 NOTE — Progress Notes (Signed)
START ON PATHWAY REGIMEN - Colorectal     A cycle is every 21 days:     Capecitabine      Oxaliplatin   **Always confirm dose/schedule in your pharmacy ordering system**  Patient Characteristics: Preoperative or Nonsurgical Candidate (Clinical Staging), Rectal, cT3 - cT4, cN0 or Any cT, cN+ Tumor Location: Rectal Therapeutic Status: Preoperative or Nonsurgical Candidate (Clinical Staging) AJCC T Category: cTX AJCC N Category: cN1 AJCC M Category: cM0 AJCC 8 Stage Grouping: Unknown Intent of Therapy: Curative Intent, Discussed with Patient

## 2021-01-10 ENCOUNTER — Other Ambulatory Visit (HOSPITAL_COMMUNITY): Payer: Self-pay

## 2021-01-10 ENCOUNTER — Telehealth: Payer: Self-pay | Admitting: Pharmacist

## 2021-01-10 ENCOUNTER — Other Ambulatory Visit: Payer: Self-pay

## 2021-01-10 DIAGNOSIS — C20 Malignant neoplasm of rectum: Secondary | ICD-10-CM

## 2021-01-10 DIAGNOSIS — C775 Secondary and unspecified malignant neoplasm of intrapelvic lymph nodes: Secondary | ICD-10-CM

## 2021-01-10 NOTE — Telephone Encounter (Signed)
Oral Oncology Pharmacist Encounter  Received new prescription for Xeloda (capecitabine) for the treatment of rectal cancer in conjunction with oxaliplatin, planned duration 6 cycles.  Prescription dose and frequency assessed for appropriateness.  CBC w/ Diff and CMP from 01/06/21 assessed, no baseline dose adjustments required at this time.  Current medication list in Epic reviewed, DDIs with Xeloda identified: Category C DDI between Xeloda and pantoprazole - proton-pump inhibitors can decrease efficacy of Xeloda - will discuss with patient alternatives to pantoprazole, such as H2RA's like famotidine while on Xeloda.  Evaluated chart and no patient barriers to medication adherence noted.   Prescription has been e-scribed to the [facility] for benefits analysis and approval.  Oral Oncology Clinic will continue to follow for insurance authorization, copayment issues, initial counseling and start date.  Leron Croak, PharmD, BCPS Hematology/Oncology Clinical Pharmacist [facility] [phone] 01/10/2021 8:48 AM

## 2021-01-10 NOTE — Progress Notes (Signed)
Referral faxed to [facility].

## 2021-01-12 ENCOUNTER — Ambulatory Visit (HOSPITAL_COMMUNITY)
Admission: RE | Admit: 2021-01-12 | Discharge: 2021-01-12 | Disposition: A | Discharge status: Home / Self Care | Payer: Medicaid Other | Source: Ambulatory Visit | Attending: Hematology | Admitting: Hematology

## 2021-01-12 ENCOUNTER — Other Ambulatory Visit: Payer: Self-pay

## 2021-01-12 DIAGNOSIS — C775 Secondary and unspecified malignant neoplasm of intrapelvic lymph nodes: Secondary | ICD-10-CM | POA: Diagnosis present

## 2021-01-12 DIAGNOSIS — C20 Malignant neoplasm of rectum: Secondary | ICD-10-CM | POA: Insufficient documentation

## 2021-01-13 ENCOUNTER — Other Ambulatory Visit (HOSPITAL_COMMUNITY): Payer: Self-pay

## 2021-01-13 ENCOUNTER — Encounter: Payer: Self-pay | Admitting: Hematology

## 2021-01-13 ENCOUNTER — Encounter: Payer: Self-pay | Admitting: *Deleted

## 2021-01-13 ENCOUNTER — Inpatient Hospital Stay: Payer: Medicaid Other

## 2021-01-13 ENCOUNTER — Telehealth: Payer: Self-pay

## 2021-01-13 NOTE — Progress Notes (Signed)
Pharmacist Chemotherapy Monitoring - Initial Assessment    Anticipated start date: 01/20/21   The following has been reviewed per standard work regarding the patient's treatment regimen: The patient's diagnosis, treatment plan and drug doses, and organ/hematologic function Lab orders and baseline tests specific to treatment regimen  The treatment plan start date, drug sequencing, and pre-medications Prior authorization status  Patient's documented medication list, including drug-drug interaction screen and prescriptions for anti-emetics and supportive care specific to the treatment regimen The drug concentrations, fluid compatibility, administration routes, and timing of the medications to be used The patient's access for treatment and lifetime cumulative dose history, if applicable  The patient's medication allergies and previous infusion related reactions, if applicable   Changes made to treatment plan:  N/A  Follow up needed:  N/A   James Coffey, [place], 01/13/2021  1:00 PM

## 2021-01-13 NOTE — Progress Notes (Signed)
Met with patient and interpreter at registration to introduce myself as Arboriculturist and to offer available resources.  Discussed one-time $1000 Radio broadcast assistant to assist with personal expenses while going through treatment. Advised what is needed to apply. He will bring on 8/15.  Patient has picked up his oral medication per him.   He has my card for any additional financial questions or concerns.

## 2021-01-13 NOTE — Telephone Encounter (Signed)
Oral Oncology Patient Advocate Encounter  Met patient in lobby room to complete application for House in an effort to reduce patient's out of pocket expense for Xeloda to $0.    Application completed and faxed to [phone].   [employer] patient assistance phone number for follow up is [phone].   This encounter will be updated until final determination.   [place] Patient [place] Phone [phone] Fax [phone] 01/13/2021 12:27 PM

## 2021-01-13 NOTE — Progress Notes (Signed)
James Coffey  Holiday representative met with patient at [employer] to discuss social security disability and follow up on medicaid application process.  Patient stated he was contacted by the inpatient team assisting with his medicaid application, and is working through options.  Patient believes he has also started the application process with social security disability.  CSW emailed the [facility] to determine if they received a referral from the in-patient team.  CSW provided education on [facility] support team, programs and resources.  CSW enrolled patient in the [facility] and provided first disbursement.  CSW will follow up with patient on disability status/process as needed.  CSW encouraged patient to contact CSW with questions or concerns.    Johnnye Lana, MSW, LCSW, OSW-C Clinical Social Worker [facility] [phone]

## 2021-01-13 NOTE — Progress Notes (Signed)
HEMATOLOGY/ONCOLOGY CONSULTATION NOTE  Date of Service: 01/13/2021  Patient Care Team: Pcp, No as PCP - General [name], MD as Consulting Physician (Urology) [name], MD as Consulting Physician (Gastroenterology) [name], MD as Consulting Physician (Colon and Rectal Surgery)  CHIEF COMPLAINTS/PURPOSE OF CONSULTATION:  Rectal Cancer- newly diagnosed.  HISTORY OF PRESENTING ILLNESS   James Coffey is a 50 year old male with a past medical history significant for tobacco abuse.  He presented to the emergency department with septic shock secondary to Fournier's gangrene of the perirectal, scrotal, penile, and abdominal regions.  He was initially admitted to the ICU requiring IV pressors.  He underwent multiple I&D's and surgical debridements.  CT abdomen/pelvis with contrast was performed on admission which showed worsening perineal inflammation, including right perirectal with significant progression of soft tissue gas now extending anteriorly into the perineum, base of penis, both scrotal sacs, and anterior abdominal wall-findings suspicious for necrotizing soft tissue infection, irregular presacral nodule measuring 2.2 cm which could represent an enlarged lymph node but is nonspecific.  He was taken to the OR on 6/25 for I&D.  He was found to have a rectal stricture and biopsies were taken of his left lateral and right lateral rectal wall for biopsy.  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  A CEA was obtained on 12/09/2020 which was elevated at 16.7.  He was again taken to the OR on 12/13/2020 for diagnostic laparoscopic he and open descending loop colostomy.  CT of the chest with contrast was performed on 12/14/2020 which showed no evidence of metastatic disease in the chest.  GI has been consulted for colonoscopy but recommend waiting until perineal wounds have healed.   I met with the patient in his hospital room. He declined use of translator.  The  patient reports that he is feeling much better.  He is anxious to be discharged to home.  The patient reports that his appetite has been good and he is not sure that he is losing any weight recently.  He denies headaches, dizziness, chest pain, shortness of breath, abdominal pain, nausea, vomiting, constipation, diarrhea.  His colostomy is functioning well.  He has not noticed any hematochezia or melena.  The patient is married and has 3 children.  Denies recent alcohol use.  He currently smokes a pack of cigarettes per day.  Denies family history of malignancy.  Medical oncology was asked see the patient for recommendations regarding his newly diagnosed rectal adenocarcinoma   INTERVAL HISTORY  James Coffey is a wonderful 50 y.o. male who was seen by [place] in the hospital for evaluation and management of rectal cancer. He is accompaned by his daughter and a [place] translatory.  The pt reports that he is doing well overall. He has been eating better and is gain back his weight. His colostomy has been working well. He notes he followed with Dr Borden/Urology post hospitalization and that all expect once incision has completedly healed up. No fevers/chills/night sweats . No pelvic pain or discomfort at this time.  The pt reports he is keen to get started with treatment.  On review of systems, pt reports no fevers/chills/focal abdominal pain, nausea, vomiting or diarrhea. No overt rectal bleeding.   MEDICAL HISTORY:  Past Medical History:  Diagnosis Date   Tobacco abuse     SURGICAL HISTORY: Past Surgical History:  Procedure Laterality Date   HERNIA REPAIR     INCISION AND DRAINAGE ABSCESS N/A 12/01/2020   Procedure: INCISION  AND DRAINAGE ABSCESS;  Surgeon: Michael Boston, MD;  Location: [facility];  Service: General;  Laterality: N/A;   INCISION AND DRAINAGE ABSCESS N/A 12/04/2020   Procedure: INCISION AND DRAINAGE FOURNIERS GANGRENE;  Surgeon: [name], MD;  Location: [facility];  Service:  General;  Laterality: N/A;   INCISION AND DRAINAGE PERIRECTAL ABSCESS N/A 11/30/2020   Procedure: INCISION AND DEBRIDEMENT OF PERIRECTAL ABSCESS AND RECTAL WALL BIOPSIES;  Surgeon: Michael Boston, MD;  Location: [facility];  Service: General;  Laterality: N/A;   INGUINAL HERNIA REPAIR Right 2007   LAPAROSCOPIC LOOP COLOSTOMY N/A 12/13/2020   Procedure: DIAGNOSTIC LAPAROSCOPY WITH  OPEN DESCENDING LOOP COLOSTOMY;  Surgeon: Armandina Gemma, MD;  Location: [facility];  Service: General;  Laterality: N/A;    SOCIAL HISTORY: Social History   Socioeconomic History   Marital status: Married    Spouse name: Not on file   Number of children: Not on file   Years of education: Not on file   Highest education level: Not on file  Occupational History   Occupation: truck driver  Tobacco Use   Smoking status: Every Day    Packs/day: 1.00    Types: Cigarettes   Smokeless tobacco: Never  Vaping Use   Vaping Use: Never used  Substance and Sexual Activity   Alcohol use: No   Drug use: No   Sexual activity: Not on file  Other Topics Concern   Not on file  Social History Narrative   From [place].  Came to the [place] in 2000.   Social Determinants of Health   Financial Resource Strain: Not on file  Food Insecurity: Not on file  Transportation Needs: Not on file  Physical Activity: Not on file  Stress: Not on file  Social Connections: Not on file  Intimate Partner Violence: Not on file    FAMILY HISTORY: Family History  Problem Relation Age of Onset   CAD Neg Hx    Inflammatory bowel disease Neg Hx     ALLERGIES:  has No Known Allergies.  MEDICATIONS:  Current Outpatient Medications  Medication Sig Dispense Refill   acetaminophen (TYLENOL) 500 MG tablet Take 2 tablets (1,000 mg total) by mouth every 8 (eight) hours as needed for mild pain or fever.     bacitracin ointment Apply topically daily. Apply to lateral flank wounds 120 g 0   capecitabine (XELODA) 500 MG tablet Take 3 tablets (1,500 mg  total) by mouth 2 (two) times daily after a meal for 14 days. Take 14 days on, 7 days off, repeat every 21 days. 84 tablet 3   dexamethasone (DECADRON) 4 MG tablet Take 2 tablets  by mouth daily. Start the day after IV chemotherapy for 2 days. Take with food. 30 tablet 1   ferrous sulfate 325 (65 FE) MG tablet Take 1 tablet (325 mg total) by mouth daily with breakfast. 30 tablet 0   ibuprofen (ADVIL) 600 MG tablet Take 1 tablet (600 mg total) by mouth 3 (three) times daily. 30 tablet 0   LORazepam (ATIVAN) 0.5 MG tablet Take 1 tablet by mouth every 6 hours as needed (Nausea or vomiting). 30 tablet 0   methocarbamol (ROBAXIN) 500 MG tablet Take 1 tablet (500 mg total) by mouth every 8 (eight) hours as needed for muscle spasms. 30 tablet 0   midodrine (PROAMATINE) 5 MG tablet Take 1 tablet (5 mg total) by mouth 3 (three) times daily with meals. 90 tablet 0   ondansetron (ZOFRAN) 8 MG tablet Take 1 tablet  by mouth 2 times daily as needed for refractory nausea / vomiting. Start on day 3 after chemotherapy. 30 tablet 1   oxyCODONE (OXY IR/ROXICODONE) 5 MG immediate release tablet Take 1-2 tablets (5-10 mg total) by mouth every 6 (six) hours as needed for severe pain or moderate pain (41m for moderate pain, 137mfor severe pain). 30 tablet 0   pantoprazole (PROTONIX) 40 MG tablet Take 1 tablet (40 mg total) by mouth at bedtime. 30 tablet 0   potassium chloride SA (KLOR-CON) 20 MEQ tablet Take 1 tablet (20 mEq total) by mouth daily for 14 days. 14 tablet 0   prochlorperazine (COMPAZINE) 10 MG tablet Take 1 tablet by mouth every 6  hours as needed (Nausea or vomiting). 30 tablet 1   No current facility-administered medications for this visit.    REVIEW OF SYSTEMS:    10 Point review of Systems was done is negative except as noted above.  PHYSICAL EXAMINATION: ECOG PERFORMANCE STATUS: 1 - Symptomatic but completely ambulatory  . Vitals:   01/06/21 1134  BP: 120/84  Pulse: 82  Resp: 19  Temp:  98.5 F (36.9 C)  SpO2: 100%   Filed Weights   01/06/21 1134  Weight: 147 lb 6.4 oz (66.9 kg)   .Body mass index is 19.45 kg/m.  NAD GENERAL:alert, in no acute distress and comfortable SKIN: no acute rashes, no significant lesions EYES: conjunctiva are pink and non-injected, sclera anicteric OROPHARYNX: MMM, no exudates, no oropharyngeal erythema or ulceration NECK: supple, no JVD LYMPH:  no palpable lymphadenopathy in the cervical, axillary or inguinal regions LUNGS: clear to auscultation b/l with normal respiratory effort HEART: regular rate & rhythm ABDOMEN:  normoactive bowel sounds , non tender, not distended. 1 surgical wound about 80-90% healed - not completely closed Extremity: no pedal edema PSYCH: alert & oriented x 3 with fluent speech NEURO: no focal motor/sensory deficits  LABORATORY DATA:  I have reviewed the data as listed  . CBC Latest Ref Rng & Units 01/06/2021 12/18/2020 12/17/2020  WBC 4.0 - 10.5 K/uL 17.5(H) 8.3 10.2  Hemoglobin 13.0 - 17.0 g/dL 12.7(L) 9.8(L) 10.7(L)  Hematocrit 39.0 - 52.0 % 39.3 31.9(L) 35.1(L)  Platelets 150 - 400 K/uL 478(H) 320 388    . CMP Latest Ref Rng & Units 01/06/2021 12/18/2020 12/16/2020  Glucose 70 - 99 mg/dL 107(H) 118(H) 110(H)  BUN 6 - 20 mg/dL _0 Creatinine 0.61 - 1.24 mg/dL 0.75 0.48(L) 0.50(L)  Sodium 135 - 145 mmol/L 141 144 139  Potassium 3.5 - 5.1 mmol/L 4.0 3.1(L) 3.4(L)  Chloride 98 - 111 mmol/L 105 111 103  CO2 22 - 32 mmol/L _1 Calcium 8.9 - 10.3 mg/dL 10.5(H) 8.6(L) 8.8(L)  Total Protein 6.5 - 8.1 g/dL 8.1 - -  Total Bilirubin 0.3 - 1.2 mg/dL 0.4 - -  Alkaline Phos 38 - 126 U/L 133(H) - -  AST 15 - 41 U/L 10(L) - -  ALT 0 - 44 U/L 18 - -   . Lab Results  Component Value Date   IRON 48 01/06/2021   TIBC 326 01/06/2021   IRONPCTSAT 15 (L) 01/06/2021   (Iron and TIBC)  Lab Results  Component Value Date   FERRITIN 205 01/06/2021   B12 -- 267  Contains abnormal data CEA (IN  HOUSE-[facility])FOR [facility] ONLY Order: [id number]: Final result   Visible to patient: No (inaccessible in MyChart)   Next appt: Today [place] 12:00 PM [place]  Dx: Rectal cancer metastasized to intrape...   0 Result Notes  Component Ref Range & Units 7 d ago   CEA ([facility]-In House) 0.00 - 5.00 ng/mL 52.55 High    Comment: (NOTE)        RADIOGRAPHIC STUDIES: I have personally reviewed the radiological images as listed and agreed with the findings in the report. CT CHEST W CONTRAST  Result Date: 12/14/2020 CLINICAL DATA:  50 year old male with colorectal cancer staging. EXAM: CT CHEST WITH CONTRAST TECHNIQUE: Multidetector CT imaging of the chest was performed during intravenous contrast administration. CONTRAST:  52m OMNIPAQUE IOHEXOL 300 MG/ML  SOLN COMPARISON:  Chest radiograph dated 11/30/2020. CT abdomen pelvis dated 11/30/2020. FINDINGS: Evaluation of this exam is limited due to respiratory motion artifact. Cardiovascular: Borderline cardiomegaly. Three vessel coronary vascular calcification. No pericardial effusion. Mild atherosclerotic calcification of the thoracic aorta. No aneurysmal dilatation or dissection. The origins of the great vessels of the aortic arch appear patent as visualized. The central pulmonary arteries are grossly unremarkable for the degree of opacification. Right-sided PICC with tip in the right atrium. Mediastinum/Nodes: No hilar or mediastinal adenopathy. The esophagus and thyroid gland are grossly unremarkable. No mediastinal fluid collection. Lungs/Pleura: Small bilateral pleural effusions with associated partial compressive atelectasis of the lower lobes. Pneumonia is not excluded clinical correlation is recommended. Mild paraseptal emphysema. There is no pneumothorax. Mucus secretions noted in the right mainstem bronchus. The airways remain patent. Upper Abdomen: Partially visualized pneumoperitoneum. In the absence of recent procedure  findings concerning for bowel perforation. Clinical correlation is recommended. CT of the abdomen pelvis may provide better evaluation if clinically indicated. Musculoskeletal: Severe degenerative changes of the left shoulder. No acute osseous pathology. IMPRESSION: 1. No CT evidence of metastatic disease in the chest. 2. Small bilateral pleural effusions with associated partial compressive atelectasis of the lower lobes. Pneumonia is not excluded. Clinical correlation is recommended. 3. Partially visualized pneumoperitoneum. This may be related to recent surgery. Clinical correlation is recommended. 4. Aortic Atherosclerosis (ICD10-I70.0) and Emphysema (ICD10-J43.9). These results were called by telephone at the time of interpretation on 12/14/2020 at 9:20 pm to the patient's nurse, worth, who verbally acknowledged these results. Electronically Signed   By: AAnner CreteM.D.   On: 12/14/2020 21:23    CT abd 6/252022 IMPRESSION: 1. Worsening perineal inflammation, including right peri-rectal. Significant progression of soft tissue gas, now extending anteriorly into the perineum, base of the penis, both scrotal sacs and anterior abdominal wall. Findings are highly suspicious for necrotizing soft tissue infection, and urgent surgical consult is recommended. 2. Irregular presacral nodule measuring 2.2 cm may represent an enlarged lymph node but is nonspecific. Recommend follow-up after course of treatment. There is rectal wall thickening which may be inflammatory, however neoplasm is not excluded.   Pathology:  SURGICAL PATHOLOGY  CASE: [id number] PATIENT: NGurney Maxin Surgical Pathology Report   Clinical History: Fournier's Gangrene; Cancer ? (jmc)   FINAL MICROSCOPIC DIAGNOSIS:   A. IRRIGATION AND DEBRIDEMENT OF SCROTUM AND PENIS:  Acute necrotizing inflammation extensively involving fascia    B. Rectal wall biopsies:  Adenocarcinoma of the rectum  Assessment and Plan:  This is  a 50year old male with   1) rectal adenocarcinoma/rectal stricturing Tx NP3,IR(complicated local staging due to fournier gangrene and infection) - WOuld consider atleast Stage III Increasing CEA levels are concerning.  -11/30/2020 CT of the abdomen/pelvis with contrast - "1. Worsening perineal inflammation, including right peri-rectal. Significant progression of soft tissue gas, now extending anteriorly into the  perineum, base of the penis, both scrotal sacs and anterior abdominal wall. Findings are highly suspicious for necrotizing soft tissue infection, and urgent surgical consult is recommended. 2. Irregular presacral nodule measuring 2.2 cm may represent an enlarged lymph node but is nonspecific. Recommend follow-up after course of treatment. There is rectal wall thickening which may be inflammatory, however neoplasm is not excluded." -12/13/2020-diverting loop colostomy. -12/14/2020 CT of the chest with contrast - "1. No CT evidence of metastatic disease in the chest. 2. Small bilateral pleural effusions with associated partial compressive atelectasis of the lower lobes. Pneumonia is not excluded. Clinical correlation is recommended. 3. Partially visualized pneumoperitoneum. This may be related to recent surgery. Clinical correlation is  recommended. 4. Aortic Atherosclerosis (ICD10-I70.0) and Emphysema (ICD10-J43.9)."   2) Fournier's gangrene s/p I&D   3) normocytic anemia - improved   4) tobacco dependence   PLAN: -The patient does not appear to have any obvious metastatic disease on CT scan. -GI planning for colonoscopy until perineal wounds have healed- will need outpatient referral.  -Discussed with patient diagnosis of locally advanced rectal adenocarcinma complicated by septic shock secondary to Fournier's gangrene of the perirectal, scrotal, penile, and abdominal regions.  -Patient is healing from his multiple I&D and surgical debridements and from his recent colostomy. He only has one  partially unhealed surgical wound remaining.  Imaging with no overt distant metastatic disease -MRI pelvis for local staging now that infection is better controlled. --Xeloda/Oxaloplatin neoadjuvant chemotherapy followed by Xeloda chemo/radiation followed by surgery. -continue rad onc  PLAN: -Labs today MRI pelvis in 1 week GI referral for colonoscopy Plan for Xelox if cleared by urology from surgical healing standpoint Chemo-counseling for Xeloda/Oxaloplatin chemotherapy RTC with Dr Irene Limbo in 2 weeks with planned start for chemotherapy with labs   All of the patients questions were answered with apparent satisfaction. The patient knows to call the clinic with any problems, questions or concerns.  I spent 40 minutes counseling the patient face to face. The total time spent in the appointment was 60 minutes and more than 50% was on counseling and direct patient cares.    Sullivan Lone MD [place] AAHIVMS [facility] Hematology/Oncology Physician [facility]  (Office):       [phone] (Work cell):  [phone] (Fax):           [phone]  01/13/2021 12:43 AM  I, Reinaldo Raddle, am acting as scribe for Dr. Sullivan Lone, MD.   .I have reviewed the above documentation for accuracy and completeness, and I agree with the above. [name] MD

## 2021-01-16 ENCOUNTER — Other Ambulatory Visit (HOSPITAL_COMMUNITY): Payer: Self-pay

## 2021-01-16 NOTE — Telephone Encounter (Signed)
Patient is approved for Xeloda from [facility] at no cost 01/15/21-until  [facility] uses [place] Patient [place] Phone [phone] Fax [phone] 01/16/2021 8:29 AM

## 2021-01-16 NOTE — Telephone Encounter (Signed)
Oral Chemotherapy Pharmacist Encounter  I spoke with patient's daughter, James Coffey, for overview of: Xeloda (capecitabine) for the treatment of rectal cancer in conjunction with oxaliplatin, planned duration of 6 cycles.   Counseled on administration, dosing, side effects, monitoring, drug-food interactions, safe handling, storage, and disposal.  Patient will take Xeloda '500mg'$  tablets, 3 tablets ('1500mg'$ ) by mouth in AM and 3 tabs ('1500mg'$ ) by mouth in PM, within 30 minutes of finishing meals, on days 1-14 of each 21 day cycle.   Oxaliplatin will be infused on day 1 of each 21 day cycle.  Xeloda and oxaliplatin start date: 01/20/21  Adverse effects include but are not limited to: fatigue, decreased blood counts, GI upset, diarrhea, mouth sores, and hand-foot syndrome.  Patient has anti-emetic on hand and knows to take it if nausea develops.   Patient will obtain anti diarrheal and alert the office of 4 or more loose stools above baseline.  Reviewed importance of keeping a medication schedule and plan for any missed doses. No barriers to medication adherence identified.  Medication reconciliation performed and medication/allergy list updated. Patient is no longer taking pantoprazole per daughter. Medication list updated.  Patient has been approved to receive the medication at no cost through [facility]. First fill with be dispensed from [facility] due to delay in [place] shipping medication to patient in a timely manner.   All questions answered.  [name] voiced understanding and appreciation.   Medication education handout placed in mail for patient. Patient knows to call the office with questions or concerns. Oral Chemotherapy Clinic phone number provided to patient.   Leron Croak, PharmD, BCPS Hematology/Oncology Clinical Pharmacist [facility] [phone] 01/16/2021 12:25 PM

## 2021-01-17 ENCOUNTER — Telehealth: Payer: Self-pay | Admitting: Hematology

## 2021-01-17 ENCOUNTER — Other Ambulatory Visit (HOSPITAL_COMMUNITY): Payer: Self-pay

## 2021-01-17 ENCOUNTER — Encounter: Payer: Self-pay | Admitting: General Practice

## 2021-01-17 ENCOUNTER — Telehealth: Payer: Self-pay | Admitting: General Practice

## 2021-01-17 ENCOUNTER — Encounter: Payer: Self-pay | Admitting: Hematology

## 2021-01-17 NOTE — Progress Notes (Signed)
[place] CSW Progress Notes  Spoke w patient's daughter, [name].  She confirmed that patient would like to apply for Social Security disability - referral sent by secure email to [facility], contact inforamtion for this agency provided.  Per daughter, patient has applied for Medicaid online and his application is being processed in [place], they also have been helped by [employer] at [facility].  Edwyna Shell, LCSW Clinical Social Worker Phone:  [phone]

## 2021-01-17 NOTE — Telephone Encounter (Signed)
Left message with changed provider visit due to provider's emergency.

## 2021-01-17 NOTE — Telephone Encounter (Signed)
[place] CSW Progress Notes  Second call to home and mobile numbers - unable to reach patient or daughter.  Left VM w my contact information and encouragement to call back.    Edwyna Shell, LCSW Clinical Social Worker Phone:  [phone]

## 2021-01-18 ENCOUNTER — Other Ambulatory Visit (HOSPITAL_COMMUNITY): Payer: Self-pay

## 2021-01-20 ENCOUNTER — Other Ambulatory Visit: Payer: Self-pay

## 2021-01-20 ENCOUNTER — Inpatient Hospital Stay: Payer: Medicaid Other

## 2021-01-20 ENCOUNTER — Encounter: Payer: Self-pay | Admitting: Hematology

## 2021-01-20 ENCOUNTER — Inpatient Hospital Stay (HOSPITAL_BASED_OUTPATIENT_CLINIC_OR_DEPARTMENT_OTHER): Payer: Medicaid Other | Admitting: Hematology

## 2021-01-20 ENCOUNTER — Telehealth: Payer: Self-pay

## 2021-01-20 ENCOUNTER — Other Ambulatory Visit (HOSPITAL_COMMUNITY): Payer: Self-pay

## 2021-01-20 VITALS — BP 129/83 | HR 100 | Temp 98.4°F | Resp 16

## 2021-01-20 DIAGNOSIS — C775 Secondary and unspecified malignant neoplasm of intrapelvic lymph nodes: Secondary | ICD-10-CM

## 2021-01-20 DIAGNOSIS — C20 Malignant neoplasm of rectum: Secondary | ICD-10-CM

## 2021-01-20 DIAGNOSIS — Z5111 Encounter for antineoplastic chemotherapy: Secondary | ICD-10-CM | POA: Diagnosis not present

## 2021-01-20 LAB — CBC WITH DIFFERENTIAL/PLATELET
Abs Immature Granulocytes: 0.22 10*3/uL — ABNORMAL HIGH (ref 0.00–0.07)
Basophils Absolute: 0.2 10*3/uL — ABNORMAL HIGH (ref 0.0–0.1)
Basophils Relative: 1 %
Eosinophils Absolute: 0.4 10*3/uL (ref 0.0–0.5)
Eosinophils Relative: 2 %
HCT: 38.4 % — ABNORMAL LOW (ref 39.0–52.0)
Hemoglobin: 12.2 g/dL — ABNORMAL LOW (ref 13.0–17.0)
Immature Granulocytes: 1 %
Lymphocytes Relative: 30 %
Lymphs Abs: 7.4 10*3/uL — ABNORMAL HIGH (ref 0.7–4.0)
MCH: 27.2 pg (ref 26.0–34.0)
MCHC: 31.8 g/dL (ref 30.0–36.0)
MCV: 85.5 fL (ref 80.0–100.0)
Monocytes Absolute: 2.1 10*3/uL — ABNORMAL HIGH (ref 0.1–1.0)
Monocytes Relative: 9 %
Neutro Abs: 14.5 10*3/uL — ABNORMAL HIGH (ref 1.7–7.7)
Neutrophils Relative %: 57 %
Platelets: 464 10*3/uL — ABNORMAL HIGH (ref 150–400)
RBC: 4.49 MIL/uL (ref 4.22–5.81)
RDW: 15.3 % (ref 11.5–15.5)
WBC: 24.8 10*3/uL — ABNORMAL HIGH (ref 4.0–10.5)
nRBC: 0 % (ref 0.0–0.2)

## 2021-01-20 LAB — COMPREHENSIVE METABOLIC PANEL
ALT: 21 U/L (ref 0–44)
AST: 16 U/L (ref 15–41)
Albumin: 3.3 g/dL — ABNORMAL LOW (ref 3.5–5.0)
Alkaline Phosphatase: 125 U/L (ref 38–126)
Anion gap: 13 (ref 5–15)
BUN: 9 mg/dL (ref 6–20)
CO2: 21 mmol/L — ABNORMAL LOW (ref 22–32)
Calcium: 9.8 mg/dL (ref 8.9–10.3)
Chloride: 101 mmol/L (ref 98–111)
Creatinine, Ser: 0.7 mg/dL (ref 0.61–1.24)
GFR, Estimated: >60 mL/min (ref >60)
Glucose, Bld: 103 mg/dL — ABNORMAL HIGH (ref 70–99)
Potassium: 3.9 mmol/L (ref 3.5–5.1)
Sodium: 135 mmol/L (ref 135–145)
Total Bilirubin: 0.3 mg/dL (ref 0.3–1.2)
Total Protein: 8.2 g/dL — ABNORMAL HIGH (ref 6.5–8.1)

## 2021-01-20 MED ORDER — DEXTROSE 5 % IV SOLN: Freq: Once | INTRAVENOUS | Status: AC

## 2021-01-20 MED ORDER — PALONOSETRON HCL INJECTION 0.25 MG/5ML
0.2500 mg | Freq: Once | INTRAVENOUS | Status: AC
  Administered 2021-01-20: 0.25 mg via INTRAVENOUS
  Filled 2021-01-20: qty 5

## 2021-01-20 MED ORDER — TRAMADOL HCL 50 MG PO TABS
50.0000 mg | ORAL_TABLET | Freq: Three times a day (TID) | ORAL | 0 refills | Status: DC | PRN
  Filled 2021-01-20: qty 30, 10d supply, fill #0

## 2021-01-20 MED ORDER — TRAMADOL HCL 50 MG PO TABS: 50.0000 mg | ORAL_TABLET | Freq: Three times a day (TID) | ORAL | 0 refills | Status: DC | PRN

## 2021-01-20 MED ORDER — SODIUM CHLORIDE 0.9 % IV SOLN
10.0000 mg | Freq: Once | INTRAVENOUS | Status: AC
  Administered 2021-01-20: 10 mg via INTRAVENOUS
  Filled 2021-01-20: qty 10

## 2021-01-20 MED ORDER — OXALIPLATIN CHEMO INJECTION 100 MG/20ML
130.0000 mg/m2 | Freq: Once | INTRAVENOUS | Status: AC
  Administered 2021-01-20: 240 mg via INTRAVENOUS
  Filled 2021-01-20: qty 40

## 2021-01-20 NOTE — Patient Instructions (Signed)
[facility] MEDICAL ONCOLOGY  Discharge Instructions: Thank you for choosing [facility] to provide your oncology and hematology care.   If you have a lab appointment with the Cancer Center, please go directly to the Cancer Center and check in at the registration area.   Wear comfortable clothing and clothing appropriate for easy access to any Portacath or PICC line.   We strive to give you quality time with your provider. You may need to reschedule your appointment if you arrive late (15 or more minutes).  Arriving late affects you and other patients whose appointments are after yours.  Also, if you miss three or more appointments without notifying the office, you may be dismissed from the clinic at the provider's discretion.      For prescription refill requests, have your pharmacy contact our office and allow 72 hours for refills to be completed.    Today you received the following chemotherapy and/or immunotherapy agents: Oxaliplatin.       To help prevent nausea and vomiting after your treatment, we encourage you to take your nausea medication as directed.  BELOW ARE SYMPTOMS THAT SHOULD BE REPORTED IMMEDIATELY: *FEVER GREATER THAN 100.4 F (38 C) OR HIGHER *CHILLS OR SWEATING *NAUSEA AND VOMITING THAT IS NOT CONTROLLED WITH YOUR NAUSEA MEDICATION *UNUSUAL SHORTNESS OF BREATH *UNUSUAL BRUISING OR BLEEDING *URINARY PROBLEMS (pain or burning when urinating, or frequent urination) *BOWEL PROBLEMS (unusual diarrhea, constipation, pain near the anus) TENDERNESS IN MOUTH AND THROAT WITH OR WITHOUT PRESENCE OF ULCERS (sore throat, sores in mouth, or a toothache) UNUSUAL RASH, SWELLING OR PAIN  UNUSUAL VAGINAL DISCHARGE OR ITCHING   Items with * indicate a potential emergency and should be followed up as soon as possible or go to the Emergency Department if any problems should occur.  Please show the CHEMOTHERAPY ALERT CARD or IMMUNOTHERAPY ALERT CARD at check-in  to the Emergency Department and triage nurse.  Should you have questions after your visit or need to cancel or reschedule your appointment, please contact [facility] MEDICAL ONCOLOG[facility]  Dept: [phone]  and follow the prompts.  Office hours are 8:00 a.m. to 4:30 p.m. Monday - Friday. Please note that voicemails left after 4:00 p.m. may not be returned until the following business day.  We are closed weekends and major holidays. You have access to a nurse at all times for urgent questions. Please call the main number to the clinic Dept: [phone] and follow the prompts.   For any non-urgent questions, you may also contact your provider using MyChart. We now offer e-Visits for anyone 18 and older to request care online for non-urgent symptoms. For details visit [email or web address].   Also download the MyChart app! Go to the app store, search "MyChart", open the app, select [place], and log in with your MyChart username and password.  Due to Covid, a mask is required upon entering the hospital/clinic. If you do not have a mask, one will be given to you upon arrival. For doctor visits, patients may have 1 support person aged 18 or older with them. For treatment visits, patients cannot have anyone with them due to current Covid guidelines and our immunocompromised population.   

## 2021-01-20 NOTE — Telephone Encounter (Signed)
This nurse received a call from [facility] requesting a new prescription for this patients Xeloda.  Information forwarded to MD.  No further questions or concerns at this time.

## 2021-01-20 NOTE — Telephone Encounter (Signed)
This nurse received a call from [facility] requesting a new prescription for Xeloda.  Advised that request will be submitted to MD.  No further questions or concerns at this time.

## 2021-01-20 NOTE — Progress Notes (Addendum)
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[place])  12/17/2020 Initial Diagnosis   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 He was last seen by Dr. Irene Limbo on 01/06/21 in consultation. I am seeing this patient in Dr. Grier Mitts absence. He was evaluated in the treatment area. He is accompanied by interpreter Sanja. He reports some residual pain, worse with sitting. He is taking OTC pain medications. He notes he took an oxycodone and his face broke out. He also reports some residual rectal bleeding. He notes he doesn't sleep well due to anxiety/worry surrounding his diagnosis.   All other systems were reviewed with the patient and are negative.  MEDICAL HISTORY:  Past Medical History:  Diagnosis Date   Tobacco abuse     SURGICAL HISTORY: Past Surgical History:  Procedure Laterality Date    HERNIA REPAIR     INCISION AND DRAINAGE ABSCESS N/A 12/01/2020   Procedure: INCISION AND DRAINAGE ABSCESS;  Surgeon: Michael Boston, MD;  Location: [facility];  Service: General;  Laterality: N/A;   INCISION AND DRAINAGE ABSCESS N/A 12/04/2020   Procedure: INCISION AND DRAINAGE FOURNIERS GANGRENE;  Surgeon: [name], MD;  Location: [facility];  Service: General;  Laterality: N/A;   INCISION AND DRAINAGE PERIRECTAL ABSCESS N/A 11/30/2020   Procedure: INCISION AND DEBRIDEMENT OF PERIRECTAL ABSCESS AND RECTAL WALL BIOPSIES;  Surgeon: Michael Boston, MD;  Location: [facility];  Service: General;  Laterality: N/A;   INGUINAL HERNIA REPAIR Right 2007   LAPAROSCOPIC LOOP COLOSTOMY N/A 12/13/2020   Procedure: DIAGNOSTIC LAPAROSCOPY WITH  OPEN DESCENDING LOOP COLOSTOMY;  Surgeon: Armandina Gemma, MD;  Location: [facility];  Service: General;  Laterality: N/A;    I have reviewed the social history and family history with the patient and they are unchanged from previous note.  ALLERGIES:  has No Known Allergies.  MEDICATIONS:  Current Outpatient Medications  Medication Sig Dispense Refill   traMADol (ULTRAM) 50 MG tablet Take 1 tablet (50 mg total) by mouth every 8 (eight) hours as needed. 30 tablet 0   acetaminophen (TYLENOL) 500 MG tablet Take 2 tablets (1,000 mg total) by mouth every 8 (eight) hours as needed for mild pain or fever.     bacitracin ointment Apply topically daily. Apply to lateral flank wounds 120 g 0   capecitabine (XELODA) 500 MG tablet Take 3 tablets (1,500 mg total) by mouth 2 (two) times daily after a meal for 14 days. Take  14 days on, 7 days off, repeat every 21 days. 84 tablet 3   dexamethasone (DECADRON) 4 MG tablet Take 2 tablets  by mouth daily. Start the day after IV chemotherapy for 2 days. Take with food. 30 tablet 1   ferrous sulfate 325 (65 FE) MG tablet Take 1 tablet (325 mg total) by mouth daily with breakfast. 30 tablet 0   ibuprofen (ADVIL) 600 MG tablet Take 1 tablet (600 mg total)  by mouth 3 (three) times daily. 30 tablet 0   LORazepam (ATIVAN) 0.5 MG tablet Take 1 tablet by mouth every 6 hours as needed (Nausea or vomiting). 30 tablet 0   methocarbamol (ROBAXIN) 500 MG tablet Take 1 tablet (500 mg total) by mouth every 8 (eight) hours as needed for muscle spasms. 30 tablet 0   ondansetron (ZOFRAN) 8 MG tablet Take 1 tablet  by mouth 2 times daily as needed for refractory nausea / vomiting. Start on day 3 after chemotherapy. 30 tablet 1   potassium chloride SA (KLOR-CON) 20 MEQ tablet Take 1 tablet (20 mEq total) by mouth daily for 14 days. 14 tablet 0   prochlorperazine (COMPAZINE) 10 MG tablet Take 1 tablet by mouth every 6  hours as needed (Nausea or vomiting). 30 tablet 1   No current facility-administered medications for this visit.    PHYSICAL EXAMINATION: ECOG PERFORMANCE STATUS: 1 - Symptomatic but completely ambulatory  There were no vitals filed for this visit. Wt Readings from Last 3 Encounters:  01/06/21 147 lb 6.4 oz (66.9 kg)  12/13/20 141 lb 1.5 oz (64 kg)  11/05/20 153 lb (69.4 kg)     GENERAL:alert, no distress and comfortable SKIN: skin color, texture, turgor are normal, no rashes or significant lesions EYES: normal, Conjunctiva are pink and non-injected, sclera clear NEURO: alert & oriented x 3 with fluent speech, no focal motor/sensory deficits RECTAL: his perianal wound is mostly closed, healing, mild pinkish discharge present.  LABORATORY DATA:  I have reviewed the data as listed CBC Latest Ref Rng & Units 01/20/2021 01/06/2021 12/18/2020  WBC 4.0 - 10.5 K/uL 24.8(H) 17.5(H) 8.3  Hemoglobin 13.0 - 17.0 g/dL 12.2(L) 12.7(L) 9.8(L)  Hematocrit 39.0 - 52.0 % 38.4(L) 39.3 31.9(L)  Platelets 150 - 400 K/uL 464(H) 478(H) 320     CMP Latest Ref Rng & Units 01/20/2021 01/06/2021 12/18/2020  Glucose 70 - 99 mg/dL 103(H) 107(H) 118(H)  BUN 6 - 20 mg/dL '9 7 17  ' Creatinine 0.61 - 1.24 mg/dL 0.70 0.75 0.48(L)  Sodium 135 - 145 mmol/L 135 141 144   Potassium 3.5 - 5.1 mmol/L 3.9 4.0 3.1(L)  Chloride 98 - 111 mmol/L 101 105 111  CO2 22 - 32 mmol/L 21(L) 26 28  Calcium 8.9 - 10.3 mg/dL 9.8 10.5(H) 8.6(L)  Total Protein 6.5 - 8.1 g/dL 8.2(H) 8.1 -  Total Bilirubin 0.3 - 1.2 mg/dL 0.3 0.4 -  Alkaline Phos 38 - 126 U/L 125 133(H) -  AST 15 - 41 U/L 16 10(L) -  ALT 0 - 44 U/L 21 18 -      RADIOGRAPHIC STUDIES: I have personally reviewed the radiological images as listed and agreed with the findings in the report. No results found.   ASSESSMENT & PLAN:  James Coffey is a 50 y.o. male with   1. Locally Advanced Rectal Adenocarcinoma, c(T3c, N2), MSI pending  -presented to ED on 11/29/20 with septic shock secondary to Fournier's gangrene -CT AP showed findings suspicious for necrotizing perineal soft tissue infection with  irregular presacral nodule. Rectal biopsies obtained on 11/30/20 confirmed rectal adenocarcinoma. -Baseline CEA on 12/09/20 was elevated at 16.7. -He underwent diagnostic laparoscopy and open descending loop colostomy on 12/13/20 with Dr. Harlow Asa. -Chest CT on 12/14/20 was negative for metastatic disease. -Repeat CEA on 01/06/21 showed further elevation to 52.55. -staging pelvis MRI on 01/12/21 showed: rectal adenocarcinoma T stage: At least T3c, possible involvement of prostatic apex; N stage: N2; long segment rectal tumor, involving anal sphincters. -Given locally advanced disease, Dr. Irene Limbo recommends neoadjuvant Xeloda/Oxaliplatin (CAPOX) -he is clinically doing better,wound is healing well (almost closed)  -Labs reviewed, WBC 24.8, Hgb 12.2. Will proceed with cycle 1 CAPOX today. -I will request MMR on his biopsy to see if he is a candidate for immune therapy   2. Rectal pain, insomnia -he reports continued rectal pain, not relieved with OTC pain medication. -he notes his face broke out after taking oxycodone. -I will prescribe tramadol for him today. I advised him that this can constipate him and to take laxative if  needed. -for insomnia, I recommend melatonin and OTC medications first. I also noted that the tramadol will also make him tired.  3. Social Support -his daughter is a Electronics engineer in nursing and is starting classes this week. She has been caring for his wound. -She lives with him and manages his medications. He does not know what medications he has been taking as she fills his pill box. -He notes his wife will be returning this Friday, 01/24/21.   PLAN: -proceed with C1 CAPOX today -I will prescribe tramadol for him today -labs and f/u in one week with PA, with Dr. Irene Limbo in 3 weeks before cycle 2 chemo  -will contact path and request MMR   No problem-specific Assessment & Plan notes found for this encounter.   No orders of the defined types were placed in this encounter.  All questions were answered. The patient knows to call the clinic with any problems, questions or concerns. No barriers to learning was detected. The total time spent in the appointment was 30 minutes.     [name], MD 01/20/2021   I, Wilburn Mylar, am acting as scribe for [name], MD.   I have reviewed the above documentation for accuracy and completeness, and I agree with the above.

## 2021-01-20 NOTE — Progress Notes (Signed)
Met with patient and interpreter today whom presented documents for [employer].  Patient approved for one-time $1000 [employer] grant to assist with personal expenses while going through treatment. Discussed in detail expenses and how they are covered. He has a copy of the expense sheet and approval letter along with the Outpatient pharmacy information. He received a gift card today from grant.  He has my card for any additional financial questions or concerns.

## 2021-01-21 ENCOUNTER — Encounter: Payer: Self-pay | Admitting: Hematology

## 2021-01-21 ENCOUNTER — Other Ambulatory Visit (HOSPITAL_COMMUNITY): Payer: Self-pay

## 2021-01-21 ENCOUNTER — Telehealth: Payer: Self-pay | Admitting: Hematology

## 2021-01-21 NOTE — Telephone Encounter (Signed)
Left message with follow-up appointments per 8/15 los.

## 2021-01-24 ENCOUNTER — Other Ambulatory Visit: Payer: Self-pay

## 2021-01-24 DIAGNOSIS — C775 Secondary and unspecified malignant neoplasm of intrapelvic lymph nodes: Secondary | ICD-10-CM

## 2021-01-24 DIAGNOSIS — C20 Malignant neoplasm of rectum: Secondary | ICD-10-CM

## 2021-01-27 ENCOUNTER — Inpatient Hospital Stay (HOSPITAL_BASED_OUTPATIENT_CLINIC_OR_DEPARTMENT_OTHER): Payer: Medicaid Other | Admitting: Hematology

## 2021-01-27 ENCOUNTER — Other Ambulatory Visit: Payer: Self-pay

## 2021-01-27 ENCOUNTER — Inpatient Hospital Stay: Payer: Medicaid Other

## 2021-01-27 VITALS — BP 129/81 | HR 103 | Temp 98.0°F | Resp 18 | Wt 150.2 lb

## 2021-01-27 DIAGNOSIS — C20 Malignant neoplasm of rectum: Secondary | ICD-10-CM

## 2021-01-27 DIAGNOSIS — C775 Secondary and unspecified malignant neoplasm of intrapelvic lymph nodes: Secondary | ICD-10-CM | POA: Diagnosis not present

## 2021-01-27 DIAGNOSIS — Z5111 Encounter for antineoplastic chemotherapy: Secondary | ICD-10-CM | POA: Diagnosis not present

## 2021-01-27 LAB — CBC WITH DIFFERENTIAL (CANCER CENTER ONLY)
Abs Immature Granulocytes: 0.22 10*3/uL — ABNORMAL HIGH (ref 0.00–0.07)
Basophils Absolute: 0.1 10*3/uL (ref 0.0–0.1)
Basophils Relative: 1 %
Eosinophils Absolute: 0.7 10*3/uL — ABNORMAL HIGH (ref 0.0–0.5)
Eosinophils Relative: 3 %
HCT: 36.7 % — ABNORMAL LOW (ref 39.0–52.0)
Hemoglobin: 12 g/dL — ABNORMAL LOW (ref 13.0–17.0)
Immature Granulocytes: 1 %
Lymphocytes Relative: 36 %
Lymphs Abs: 8.2 10*3/uL — ABNORMAL HIGH (ref 0.7–4.0)
MCH: 27.6 pg (ref 26.0–34.0)
MCHC: 32.7 g/dL (ref 30.0–36.0)
MCV: 84.6 fL (ref 80.0–100.0)
Monocytes Absolute: 2.1 10*3/uL — ABNORMAL HIGH (ref 0.1–1.0)
Monocytes Relative: 9 %
Neutro Abs: 11.7 10*3/uL — ABNORMAL HIGH (ref 1.7–7.7)
Neutrophils Relative %: 50 %
Platelet Count: 439 10*3/uL — ABNORMAL HIGH (ref 150–400)
RBC: 4.34 MIL/uL (ref 4.22–5.81)
RDW: 14.9 % (ref 11.5–15.5)
WBC Count: 23 10*3/uL — ABNORMAL HIGH (ref 4.0–10.5)
nRBC: 0 % (ref 0.0–0.2)

## 2021-01-27 LAB — CMP (CANCER CENTER ONLY)
ALT: 28 U/L (ref 0–44)
AST: 15 U/L (ref 15–41)
Albumin: 3.1 g/dL — ABNORMAL LOW (ref 3.5–5.0)
Alkaline Phosphatase: 132 U/L — ABNORMAL HIGH (ref 38–126)
Anion gap: 9 (ref 5–15)
BUN: 12 mg/dL (ref 6–20)
CO2: 22 mmol/L (ref 22–32)
Calcium: 9.6 mg/dL (ref 8.9–10.3)
Chloride: 105 mmol/L (ref 98–111)
Creatinine: 0.83 mg/dL (ref 0.61–1.24)
GFR, Estimated: >60 mL/min (ref >60)
Glucose, Bld: 153 mg/dL — ABNORMAL HIGH (ref 70–99)
Potassium: 4 mmol/L (ref 3.5–5.1)
Sodium: 136 mmol/L (ref 135–145)
Total Bilirubin: 0.5 mg/dL (ref 0.3–1.2)
Total Protein: 7.7 g/dL (ref 6.5–8.1)

## 2021-01-28 LAB — SURGICAL PATHOLOGY

## 2021-01-28 NOTE — Progress Notes (Signed)
We received a faxed notice from [facility] that they have been unable to speak with Mr Bohlander or his daughter to schedule appointments.  I spoke with MR Dempewolf he told me to call his daughter, [name].  I called Adelisa and left a detailed message asking her to cal [facility] I provided the phone number.

## 2021-01-30 NOTE — Progress Notes (Signed)
Subjective:    James Coffey - 50 y.o. male MRN [id number]  Date of birth: Mar 02, 1971  HPI  James Coffey is to establish care.   Current issues and/or concerns: Visit 11/29/2020 - 12/19/2020 at [facility] per MD note: Discharge Summary: This 50 years old male with PMH significant for tobacco use, history of rectal cancer presented in the ED with septic shock secondary to Fournier's gangrene of the perirectal, scrotal, penile and abdominal regions.  Patient was initially admitted in the ICU,  he was requiring vasopressors to maintain blood pressure.  Blood pressure has improved.  He is off pressors now,  he underwent multiple incision and drainage and surgical debridements,  general surgery and urology is following.  Patient is off antibiotics now.  He was started on midodrine for blood pressure support.  He underwent laparoscopy and descending loop colostomy.   Significant hospital events. 6/25 I&D and debridement. Central line placement 6/26 Second I&D 6/28 on vasopressors. 6/29 third I&D 6/30 remained on levo. received one unit PRBC overnight for hgb 7.1 7/1 PICC line placed and CVC removed overnight, pressor requirement decreased with addition of midodrine D/c'd levophed. 7/8: Diagnostic laparoscopy, open descending loop colostomy 7/9: CT chest without evidence of metastatic disease.   Septic shock secondary to Fournier's gangrene: Extensive ICU care and vasopressor requirement.  All improved now.   Patient is on prolonged steroids, currently on tapering dose.  Also remains on midodrine.  We will gradually taper off. Completed antibiotic therapy for E. coli and multiple bacteria's.   Local wound care.  Trying to find out wound care at home before discharge.   Pain medications were prescribed.  Was not able to receive any home health nursing support, however patient has a family member with her nursing training they will provide dressing changes at home. Dressing instructions  placed.   Adenocarcinoma of rectum. It was noted during debridement of Fournier's gangrene. GI following, recommended colonoscopy once perineal wounds are healed. CT chest completed without metastatic disease. He underwent diagnostic laparoscopy, open descending loop colostomy 12/13/20 , tolerated well. Oncology consulted, they will schedule outpatient follow-up for chemotherapy. Continue Foley catheter per urology due to perineal wounds. Patient will need surgical staples removal in 5 days as per surgery. There is follow-up is scheduled at surgical office next week for recheck and staple removal.   Tobacco use disorder: Nicotine patch.   Normocytic normochromic anemia. Hb remained stable,  continue iron supplementation.   Patient has medically stabilized.  He has extensive wounds that needs care at home and this was talked to the family members.  He is stable to discharge.  He will have follow-up with surgery, oncology and gastroenterology.   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 Reports wounds located near right inner aspect of buttocks and several on abdomen. Daughter who is a Air cabin crew with daily cleansing. Reports Oncology also checking wounds at each visit and says no infection healing well. Scheduled to see Oncology again on 02/11/2021. Plan to have second chemo infusion 02/12/2021. Foley catheter removed during time surgical staples removed  at [facility]. Has a colostomy changing bag every 3 days and changing complete set every 7 days. Would like medication  for sleep. Has tried several over-the-counter medications without relief. Was told per Oncology that several of the medications he is prescribed (one being Tramadol) may assist with sleep or to try over-the-counter Benadryl of which he has not tried yet. Declined referral to Wound Care and Gastroenterology reporting Oncology and his daughter managing care for now. Requesting medication refills but unsure of exactly which ones need a refill as his daughter who manages his medications is not present today in office. Patient reports he will ask his daughter to call our office back to let us know which medications need refills. Denies any further issues/concerns.  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 He has been smoking an average of 1 pack per day. He has never used smokeless tobacco. He reports that he does not drink alcohol and does not use drugs.   Family History  family history is not on file.    Medications: reviewed and updated   Objective:   Physical Exam BP 119/78 (BP Location: Right Arm, Patient Position: Standing, Cuff Size: Large)   Pulse (!) 108   Temp 98 F (36.7 C) (Oral)   Resp 16   Ht '6\' 5"'$  (1.956 m)   Wt 151 lb 3.2 oz (68.6 kg)   BMI 17.93 kg/m   Physical Exam HENT:     Head: Normocephalic and atraumatic.  Eyes:     Extraocular Movements: Extraocular movements intact.     Conjunctiva/sclera: Conjunctivae normal.     Pupils: Pupils are equal, round, and reactive to light.  Cardiovascular:     Rate and Rhythm: Tachycardia present.     Pulses: Normal pulses.     Heart sounds: Normal heart sounds.  Pulmonary:     Effort: Pulmonary effort is normal.     Breath sounds: Normal breath sounds.  Musculoskeletal:     Cervical back: Normal range of motion and neck supple.  Neurological:     General: No focal deficit present.     Mental Status: He is alert and oriented to person, place, and time.  Psychiatric:        Mood and Affect: Mood normal.        Behavior: Behavior normal.      Assessment & Plan:  1. Encounter to establish care: -  Patient presents today to establish care.  - Return for annual physical examination, labs, and health maintenance. Arrive fasting meaning having no food for at least 8 hours prior to appointment. You may have only water or black coffee. Please take scheduled medications as normal.  2. Hospital discharge follow-up: - Reviewed hospital course, current medications, ensured proper follow-up in place, and addressed concerns. - Patient reports his daughter will call our office to let us know which medications need refills.    3. Fournier gangrene: 4. Adenocarcinoma of rectum [facility]): - Patient declined referral to Wound Care. Reports Oncology and his daughter who is a Presenter, broadcasting are managing for now.  - Keep scheduled appointments with Oncology.   5. Colostomy present [facility]): - Patient declined referral to  Gastroenterology. Reports his daughter who is a Presenter, broadcasting is managing care for now.   6. Insomnia, unspecified type: - Counseled unable to prescribe medication for insomnia as contraindicated with current prescription Tramadol (Ultram) prescribed per Oncology.   7. Language barrier: - [place] in-person interpreter, Mikael Spray, participated during today's visit.    Patient was given clear instructions to go to Emergency Department or return to medical center if symptoms don't improve, worsen, or new problems develop.The patient verbalized understanding.  I discussed the assessment and treatment plan with the patient. The patient was provided an opportunity to ask questions and all were answered. The patient agreed with the plan and demonstrated an understanding of the instructions.   The patient was advised to call back or seek an in-person evaluation if the symptoms worsen or if the condition fails to improve as anticipated.    Durene Fruits, NP 01/31/2021, 4:12 PM Primary Care at [facility]

## 2021-01-31 ENCOUNTER — Encounter: Payer: Self-pay | Admitting: Family

## 2021-01-31 ENCOUNTER — Ambulatory Visit (INDEPENDENT_AMBULATORY_CARE_PROVIDER_SITE_OTHER): Payer: Medicaid Other | Admitting: Family

## 2021-01-31 ENCOUNTER — Other Ambulatory Visit: Payer: Self-pay

## 2021-01-31 VITALS — BP 119/78 | HR 108 | Temp 98.0°F | Resp 16 | Ht 77.0 in | Wt 151.2 lb

## 2021-01-31 DIAGNOSIS — Z7689 Persons encountering health services in other specified circumstances: Secondary | ICD-10-CM

## 2021-01-31 DIAGNOSIS — A419 Sepsis, unspecified organism: Secondary | ICD-10-CM

## 2021-01-31 DIAGNOSIS — F17200 Nicotine dependence, unspecified, uncomplicated: Secondary | ICD-10-CM

## 2021-01-31 DIAGNOSIS — C20 Malignant neoplasm of rectum: Secondary | ICD-10-CM | POA: Diagnosis not present

## 2021-01-31 DIAGNOSIS — N493 Fournier gangrene: Secondary | ICD-10-CM | POA: Diagnosis not present

## 2021-01-31 DIAGNOSIS — G4700 Insomnia, unspecified: Secondary | ICD-10-CM | POA: Diagnosis not present

## 2021-01-31 DIAGNOSIS — Z933 Colostomy status: Secondary | ICD-10-CM | POA: Diagnosis not present

## 2021-01-31 DIAGNOSIS — Z789 Other specified health status: Secondary | ICD-10-CM

## 2021-01-31 DIAGNOSIS — D649 Anemia, unspecified: Secondary | ICD-10-CM

## 2021-01-31 DIAGNOSIS — Z09 Encounter for follow-up examination after completed treatment for conditions other than malignant neoplasm: Secondary | ICD-10-CM

## 2021-01-31 NOTE — Progress Notes (Signed)
Patient is here for hospital f/up and est care. Patient would like medication refill.

## 2021-02-02 NOTE — Progress Notes (Signed)
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[place])  12/17/2020 Initial Diagnosis   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[place])   01/13/2021 Cancer Staging   Staging form: Colon and Rectum, AJCC 8th Edition - Clinical stage from 01/13/2021: Stage IIIB (cT3, cN2a, cM0) - Signed by [name], MD on 01/19/2021 Stage prefix: Initial diagnosis Histologic grade (G): G2 Histologic grading system: 4 grade system   01/20/2021 -  Chemotherapy    Patient is on Treatment Plan: RECTAL XELOX (CAPEOX) Q21D X 6 CYCLES          CURRENT THERAPY:  Neoadjuvant chemotherapy with Xeloda/Oxaliplatin starting 01/20/21  INTERVAL HISTORY:  James Coffey is here for a follow up of rectal cancer and interview was held with the use of [place] video interpreter . Is here for toxicity check after starting neoadjuvant [place] ox chemotherapy.  Notes no acute toxicities from his first cycle of oxaliplatin for his 2 weeks of Xeloda at this time. No nausea no vomiting no diarrhea.  No symptoms suggestive of neuropathy. Notes good appetite with improving weight.  Notes he has gained 3 pounds in the last 3 weeks. Still has gradually healing surgical incision in his perineal area.  No signs of infection pain fevers. Patient notes no other acute new concerns. Notes decreased rectal pain. No overt anxiety or depression.  He is motivated to get this treated. Still  smoking about half a pack a day but is trying to cut down.  MEDICAL HISTORY:  Past Medical History:  Diagnosis Date   Cancer ([place])    Tobacco abuse     SURGICAL HISTORY: Past Surgical History:  Procedure Laterality Date   HERNIA REPAIR     INCISION AND DRAINAGE ABSCESS N/A 12/01/2020   Procedure: INCISION AND DRAINAGE ABSCESS;  Surgeon: Michael Boston, MD;  Location: [facility];  Service: General;  Laterality: N/A;   INCISION AND DRAINAGE ABSCESS N/A 12/04/2020   Procedure: INCISION AND DRAINAGE FOURNIERS GANGRENE;  Surgeon: [name], MD;  Location: [facility];  Service: General;  Laterality: N/A;   INCISION AND DRAINAGE PERIRECTAL ABSCESS N/A 11/30/2020   Procedure: INCISION AND DEBRIDEMENT OF PERIRECTAL ABSCESS AND RECTAL WALL BIOPSIES;  Surgeon: Michael Boston, MD;  Location: [facility];  Service: General;  Laterality: N/A;   INGUINAL HERNIA REPAIR Right 2007   LAPAROSCOPIC LOOP COLOSTOMY N/A 12/13/2020   Procedure: DIAGNOSTIC LAPAROSCOPY WITH  OPEN DESCENDING LOOP COLOSTOMY;  Surgeon: Armandina Gemma, MD;  Location: [facility];  Service: General;  Laterality: N/A;   RECTAL SURGERY      I have reviewed the social history and family history with the patient and they are unchanged from previous note.  ALLERGIES:  has No Known Allergies.  MEDICATIONS:  Current Outpatient Medications  Medication Sig Dispense Refill   acetaminophen (TYLENOL) 500 MG tablet Take 2 tablets (1,000 mg total) by mouth every 8 (eight) hours as needed for mild pain or fever.     bacitracin ointment Apply topically daily. Apply to lateral flank wounds 120 g 0  dexamethasone (DECADRON) 4 MG tablet Take 2 tablets  by mouth daily. Start the day after IV chemotherapy for 2 days. Take with food. 30 tablet 1   ferrous sulfate 325 (65 FE) MG tablet Take 1 tablet (325 mg total) by mouth daily with breakfast. 30 tablet 0   ibuprofen (ADVIL) 600 MG tablet Take 1 tablet (600 mg total) by mouth 3 (three) times daily. 30 tablet 0    LORazepam (ATIVAN) 0.5 MG tablet Take 1 tablet by mouth every 6 hours as needed (Nausea or vomiting). 30 tablet 0   methocarbamol (ROBAXIN) 500 MG tablet Take 1 tablet (500 mg total) by mouth every 8 (eight) hours as needed for muscle spasms. 30 tablet 0   ondansetron (ZOFRAN) 8 MG tablet Take 1 tablet  by mouth 2 times daily as needed for refractory nausea / vomiting. Start on day 3 after chemotherapy. 30 tablet 1   potassium chloride SA (KLOR-CON) 20 MEQ tablet Take 1 tablet (20 mEq total) by mouth daily for 14 days. 14 tablet 0   prochlorperazine (COMPAZINE) 10 MG tablet Take 1 tablet by mouth every 6  hours as needed (Nausea or vomiting). 30 tablet 1   traMADol (ULTRAM) 50 MG tablet Take 1 tablet by mouth every 8 hours as needed. 30 tablet 0   No current facility-administered medications for this visit.    PHYSICAL EXAMINATION: ECOG PERFORMANCE STATUS: 1 - Symptomatic but completely ambulatory  Vitals:   01/27/21 1317  BP: 129/81  Pulse: (!) 103  Resp: 18  Temp: 98 F (36.7 C)  SpO2: 98%   Wt Readings from Last 3 Encounters:  01/31/21 151 lb 3.2 oz (68.6 kg)  01/27/21 150 lb 3.2 oz (68.1 kg)  01/06/21 147 lb 6.4 oz (66.9 kg)   . GENERAL:alert, in no acute distress and comfortable SKIN: no acute rashes, no significant lesions EYES: conjunctiva are pink and non-injected, sclera anicteric OROPHARYNX: MMM, no exudates, no oropharyngeal erythema or ulceration NECK: supple, no JVD LYMPH:  no palpable lymphadenopathy in the cervical, axillary or inguinal regions LUNGS: clear to auscultation b/l with normal respiratory effort HEART: regular rate & rhythm ABDOMEN:  normoactive bowel sounds , non tender, not distended. Extremity: no pedal edema PSYCH: alert & oriented x 3 with fluent speech NEURO: no focal motor/sensory deficits  LABORATORY DATA:  I have reviewed the data as listed CBC Latest Ref Rng & Units 01/27/2021 01/20/2021 01/06/2021  WBC 4.0 - 10.5 K/uL 23.0(H) 24.8(H)  17.5(H)  Hemoglobin 13.0 - 17.0 g/dL 12.0(L) 12.2(L) 12.7(L)  Hematocrit 39.0 - 52.0 % 36.7(L) 38.4(L) 39.3  Platelets 150 - 400 K/uL 439(H) 464(H) 478(H)     CMP Latest Ref Rng & Units 01/27/2021 01/20/2021 01/06/2021  Glucose 70 - 99 mg/dL 153(H) 103(H) 107(H)  BUN 6 - 20 mg/dL '12 9 7  ' Creatinine 0.61 - 1.24 mg/dL 0.83 0.70 0.75  Sodium 135 - 145 mmol/L 136 135 141  Potassium 3.5 - 5.1 mmol/L 4.0 3.9 4.0  Chloride 98 - 111 mmol/L 105 101 105  CO2 22 - 32 mmol/L 22 21(L) 26  Calcium 8.9 - 10.3 mg/dL 9.6 9.8 10.5(H)  Total Protein 6.5 - 8.1 g/dL 7.7 8.2(H) 8.1  Total Bilirubin 0.3 - 1.2 mg/dL 0.5 0.3 0.4  Alkaline Phos 38 - 126 U/L 132(H) 125 133(H)  AST 15 - 41 U/L 15 16 10(L)  ALT 0 - 44 U/L '28 21 18      ' RADIOGRAPHIC STUDIES: I have personally reviewed the radiological images as  listed and agreed with the findings in the report. No results found.   ASSESSMENT & PLAN:  James Coffey is a 50 y.o. male with   1. Locally Advanced Rectal Adenocarcinoma, c(T3c, N2), MSI pending  -presented to ED on 11/29/20 with septic shock secondary to Fournier's gangrene -CT AP showed findings suspicious for necrotizing perineal soft tissue infection with irregular presacral nodule. Rectal biopsies obtained on 11/30/20 confirmed rectal adenocarcinoma. -Baseline CEA on 12/09/20 was elevated at 16.7. -He underwent diagnostic laparoscopy and open descending loop colostomy on 12/13/20 with Dr. Harlow Asa. -Chest CT on 12/14/20 was negative for metastatic disease. -Repeat CEA on 01/06/21 showed further elevation to 52.55. -staging pelvis MRI on 01/12/21 showed: rectal adenocarcinoma T stage: At least T3c, possible involvement of prostatic apex; N stage: N2; long segment rectal tumor, involving anal sphincters. -Given locally advanced disease,he was recommended neoadjuvant Xeloda/Oxaliplatin (CAPOX) PLAN -Labs done today reviewed with the patient.  Stable. -Tolerated cycle 1 of [place] ox well thus far with no acute  limiting toxicities. -MMR was ordered on his pathology specimen. -We will schedule for cycle 2 of [place] ox. -Monitor for healing of his perineal wound  2. Rectal pain, insomnia He notes his rectal pain is better and he has not needed to take his tramadol much.  Had some possible rash from oxycodone. Plan -tramadol as needed  Follow-up  F/u for labs, MD and C2 of Oxaloplatin as scheduled   . The total time spent in the appointment was 32 minutes and more than 50% was on counseling and direct patient cares.  Sullivan Lone MD MS Hematology/Oncology Physician [facility]

## 2021-02-03 ENCOUNTER — Encounter: Payer: Self-pay | Admitting: Hematology

## 2021-02-07 ENCOUNTER — Other Ambulatory Visit: Payer: Self-pay

## 2021-02-07 DIAGNOSIS — C20 Malignant neoplasm of rectum: Secondary | ICD-10-CM

## 2021-02-07 DIAGNOSIS — C775 Secondary and unspecified malignant neoplasm of intrapelvic lymph nodes: Secondary | ICD-10-CM

## 2021-02-11 ENCOUNTER — Inpatient Hospital Stay: Payer: Medicaid Other | Attending: Hematology | Admitting: Hematology

## 2021-02-11 ENCOUNTER — Other Ambulatory Visit: Payer: Self-pay

## 2021-02-11 ENCOUNTER — Other Ambulatory Visit (HOSPITAL_COMMUNITY): Payer: Self-pay

## 2021-02-11 ENCOUNTER — Encounter: Payer: Self-pay | Admitting: Hematology

## 2021-02-11 ENCOUNTER — Inpatient Hospital Stay: Payer: Medicaid Other

## 2021-02-11 VITALS — BP 115/76 | HR 80 | Temp 98.2°F | Resp 19 | Ht 77.0 in | Wt 159.7 lb

## 2021-02-11 DIAGNOSIS — C775 Secondary and unspecified malignant neoplasm of intrapelvic lymph nodes: Secondary | ICD-10-CM | POA: Diagnosis not present

## 2021-02-11 DIAGNOSIS — C20 Malignant neoplasm of rectum: Secondary | ICD-10-CM | POA: Diagnosis not present

## 2021-02-11 DIAGNOSIS — Z5111 Encounter for antineoplastic chemotherapy: Secondary | ICD-10-CM | POA: Insufficient documentation

## 2021-02-11 DIAGNOSIS — Z79899 Other long term (current) drug therapy: Secondary | ICD-10-CM | POA: Insufficient documentation

## 2021-02-11 DIAGNOSIS — F17210 Nicotine dependence, cigarettes, uncomplicated: Secondary | ICD-10-CM | POA: Insufficient documentation

## 2021-02-11 LAB — CBC WITH DIFFERENTIAL (CANCER CENTER ONLY)
Abs Immature Granulocytes: 0.05 10*3/uL (ref 0.00–0.07)
Basophils Absolute: 0.1 10*3/uL (ref 0.0–0.1)
Basophils Relative: 1 %
Eosinophils Absolute: 0.3 10*3/uL (ref 0.0–0.5)
Eosinophils Relative: 3 %
HCT: 38.4 % — ABNORMAL LOW (ref 39.0–52.0)
Hemoglobin: 12.8 g/dL — ABNORMAL LOW (ref 13.0–17.0)
Immature Granulocytes: 1 %
Lymphocytes Relative: 38 %
Lymphs Abs: 3.5 10*3/uL (ref 0.7–4.0)
MCH: 29.2 pg (ref 26.0–34.0)
MCHC: 33.3 g/dL (ref 30.0–36.0)
MCV: 87.7 fL (ref 80.0–100.0)
Monocytes Absolute: 0.7 10*3/uL (ref 0.1–1.0)
Monocytes Relative: 8 %
Neutro Abs: 4.5 10*3/uL (ref 1.7–7.7)
Neutrophils Relative %: 49 %
Platelet Count: 254 10*3/uL (ref 150–400)
RBC: 4.38 MIL/uL (ref 4.22–5.81)
RDW: 19.7 % — ABNORMAL HIGH (ref 11.5–15.5)
WBC Count: 9.3 10*3/uL (ref 4.0–10.5)
nRBC: 0 % (ref 0.0–0.2)

## 2021-02-11 LAB — FERRITIN: Ferritin: 108 ng/mL (ref 24–336)

## 2021-02-11 LAB — CMP (CANCER CENTER ONLY)
ALT: 16 U/L (ref 0–44)
AST: 14 U/L — ABNORMAL LOW (ref 15–41)
Albumin: 3.5 g/dL (ref 3.5–5.0)
Alkaline Phosphatase: 96 U/L (ref 38–126)
Anion gap: 9 (ref 5–15)
BUN: 10 mg/dL (ref 6–20)
CO2: 23 mmol/L (ref 22–32)
Calcium: 9.5 mg/dL (ref 8.9–10.3)
Chloride: 106 mmol/L (ref 98–111)
Creatinine: 0.77 mg/dL (ref 0.61–1.24)
GFR, Estimated: >60 mL/min (ref >60)
Glucose, Bld: 192 mg/dL — ABNORMAL HIGH (ref 70–99)
Potassium: 3.9 mmol/L (ref 3.5–5.1)
Sodium: 138 mmol/L (ref 135–145)
Total Bilirubin: 0.4 mg/dL (ref 0.3–1.2)
Total Protein: 7.1 g/dL (ref 6.5–8.1)

## 2021-02-11 LAB — IRON AND TIBC
Iron: 75 ug/dL (ref 42–163)
Saturation Ratios: 19 % — ABNORMAL LOW (ref 20–55)
TIBC: 396 ug/dL (ref 202–409)
UIBC: 321 ug/dL (ref 117–376)

## 2021-02-11 LAB — CEA (IN HOUSE-CHCC): CEA (CHCC-In House): 23.21 ng/mL — ABNORMAL HIGH (ref 0.00–5.00)

## 2021-02-11 LAB — VITAMIN B12: Vitamin B-12: 223 pg/mL (ref 180–914)

## 2021-02-11 MED ORDER — LORAZEPAM 0.5 MG PO TABS
0.5000 mg | ORAL_TABLET | Freq: Four times a day (QID) | ORAL | 0 refills | Status: DC | PRN
  Filled 2021-02-11: qty 30, 8d supply, fill #0

## 2021-02-11 MED ORDER — ONDANSETRON HCL 8 MG PO TABS
8.0000 mg | ORAL_TABLET | Freq: Two times a day (BID) | ORAL | 1 refills | Status: DC | PRN
  Filled 2021-02-11: qty 30, 15d supply, fill #0

## 2021-02-11 MED ORDER — TRAMADOL HCL 50 MG PO TABS
50.0000 mg | ORAL_TABLET | Freq: Three times a day (TID) | ORAL | 0 refills | Status: DC | PRN
  Filled 2021-02-11: qty 30, 10d supply, fill #0

## 2021-02-11 MED ORDER — CAPECITABINE 500 MG PO TABS: 850.0000 mg/m2 | ORAL_TABLET | Freq: Two times a day (BID) | ORAL | 2 refills | Status: AC

## 2021-02-11 MED ORDER — PROCHLORPERAZINE MALEATE 10 MG PO TABS
10.0000 mg | ORAL_TABLET | Freq: Four times a day (QID) | ORAL | 1 refills | Status: DC | PRN
  Filled 2021-02-11: qty 30, 8d supply, fill #0
  Filled 2021-02-21: qty 30, 8d supply, fill #1

## 2021-02-11 MED ORDER — DEXAMETHASONE 4 MG PO TABS
8.0000 mg | ORAL_TABLET | Freq: Every day | ORAL | 1 refills | Status: DC
  Filled 2021-02-11: qty 30, 15d supply, fill #0

## 2021-02-12 ENCOUNTER — Inpatient Hospital Stay: Payer: Medicaid Other

## 2021-02-12 VITALS — BP 129/77 | HR 95 | Temp 98.5°F | Resp 16

## 2021-02-12 DIAGNOSIS — Z5111 Encounter for antineoplastic chemotherapy: Secondary | ICD-10-CM | POA: Diagnosis not present

## 2021-02-12 DIAGNOSIS — C20 Malignant neoplasm of rectum: Secondary | ICD-10-CM

## 2021-02-12 DIAGNOSIS — C775 Secondary and unspecified malignant neoplasm of intrapelvic lymph nodes: Secondary | ICD-10-CM

## 2021-02-12 MED ORDER — SODIUM CHLORIDE 0.9 % IV SOLN
10.0000 mg | Freq: Once | INTRAVENOUS | Status: AC
  Administered 2021-02-12: 10 mg via INTRAVENOUS
  Filled 2021-02-12: qty 10

## 2021-02-12 MED ORDER — PALONOSETRON HCL INJECTION 0.25 MG/5ML
0.2500 mg | Freq: Once | INTRAVENOUS | Status: AC
  Administered 2021-02-12: 0.25 mg via INTRAVENOUS
  Filled 2021-02-12: qty 5

## 2021-02-12 MED ORDER — DEXTROSE 5 % IV SOLN: Freq: Once | INTRAVENOUS | Status: AC

## 2021-02-12 MED ORDER — OXALIPLATIN CHEMO INJECTION 100 MG/20ML
130.0000 mg/m2 | Freq: Once | INTRAVENOUS | Status: AC
  Administered 2021-02-12: 240 mg via INTRAVENOUS
  Filled 2021-02-12: qty 40

## 2021-02-12 NOTE — Patient Instructions (Signed)
[facility]  Discharge Instructions: Thank you for choosing [facility] to provide your oncology and hematology care.   If you have a lab appointment with the Cancer Center, please go directly to the Cancer Center and check in at the registration area.   Wear comfortable clothing and clothing appropriate for easy access to any Portacath or PICC line.   We strive to give you quality time with your provider. You may need to reschedule your appointment if you arrive late (15 or more minutes).  Arriving late affects you and other patients whose appointments are after yours.  Also, if you miss three or more appointments without notifying the office, you may be dismissed from the clinic at the provider's discretion.      For prescription refill requests, have your pharmacy contact our office and allow 72 hours for refills to be completed.    Today you received the following chemotherapy and/or immunotherapy agents: Oxaliplatin.       To help prevent nausea and vomiting after your treatment, we encourage you to take your nausea medication as directed.  BELOW ARE SYMPTOMS THAT SHOULD BE REPORTED IMMEDIATELY: *FEVER GREATER THAN 100.4 F (38 C) OR HIGHER *CHILLS OR SWEATING *NAUSEA AND VOMITING THAT IS NOT CONTROLLED WITH YOUR NAUSEA MEDICATION *UNUSUAL SHORTNESS OF BREATH *UNUSUAL BRUISING OR BLEEDING *URINARY PROBLEMS (pain or burning when urinating, or frequent urination) *BOWEL PROBLEMS (unusual diarrhea, constipation, pain near the anus) TENDERNESS IN MOUTH AND THROAT WITH OR WITHOUT PRESENCE OF ULCERS (sore throat, sores in mouth, or a toothache) UNUSUAL RASH, SWELLING OR PAIN  UNUSUAL VAGINAL DISCHARGE OR ITCHING   Items with * indicate a potential emergency and should be followed up as soon as possible or go to the Emergency Department if any problems should occur.  Please show the CHEMOTHERAPY ALERT CARD or IMMUNOTHERAPY ALERT CARD at check-in  to the Emergency Department and triage nurse.  Should you have questions after your visit or need to cancel or reschedule your appointment, please contact [facility]  Dept: [phone]  and follow the prompts.  Office hours are 8:00 a.m. to 4:30 p.m. Monday - Friday. Please note that voicemails left after 4:00 p.m. may not be returned until the following business day.  We are closed weekends and major holidays. You have access to a nurse at all times for urgent questions. Please call the main number to the clinic Dept: [phone] and follow the prompts.   For any non-urgent questions, you may also contact your provider using MyChart. We now offer e-Visits for anyone 18 and older to request care online for non-urgent symptoms. For details visit [email or web address].   Also download the MyChart app! Go to the app store, search "MyChart", open the app, select [facility], and log in with your MyChart username and password.  Due to Covid, a mask is required upon entering the hospital/clinic. If you do not have a mask, one will be given to you upon arrival. For doctor visits, patients may have 1 support person aged 18 or older with them. For treatment visits, patients cannot have anyone with them due to current Covid guidelines and our immunocompromised population.   

## 2021-02-13 ENCOUNTER — Telehealth: Payer: Self-pay | Admitting: Hematology

## 2021-02-13 ENCOUNTER — Telehealth: Payer: Self-pay | Admitting: Pharmacist

## 2021-02-13 NOTE — Telephone Encounter (Signed)
Scheduled follow-up appointments per 9/6 los. Patient's daughter is aware. 

## 2021-02-13 NOTE — Telephone Encounter (Signed)
Oral Chemotherapy Pharmacist Encounter   Spoke with patient's daughter today to follow up regarding patient's oral chemotherapy medication: Xeloda (capecitabine)  Called and confirmed with [facility] that they have current active script on file for Xeloda (capecitabine). Daughter provided with order number and phone number [phone]) to call and set up shipment.   Xeloda will be delivered to patient's home on 02/15/21. Per Dr. Irene Limbo, patient is to start 02/15/21 and take full cycle (14 days on with 7 days off). Advised daughter to call [facility] to set up subsequent cycle of Xeloda shipment the week of 02/24/21. Daughter expressed appreciation and understanding.   Patient's daughter knows to call the office with questions or concerns.  Leron Croak, PharmD, BCPS Hematology/Oncology Clinical Pharmacist [facility] [phone] 02/13/2021 12:07 PM

## 2021-02-18 ENCOUNTER — Encounter: Payer: Self-pay | Admitting: Hematology

## 2021-02-18 NOTE — Progress Notes (Signed)
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[place])  12/17/2020 Initial Diagnosis   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[place])   01/13/2021 Cancer Staging   Staging form: Colon and Rectum, AJCC 8th Edition - Clinical stage from 01/13/2021: Stage IIIB (cT3, cN2a, cM0) - Signed by [name], MD on 01/19/2021 Stage prefix: Initial diagnosis Histologic grade (G): G2 Histologic grading system: 4 grade system   01/20/2021 -  Chemotherapy    Patient is on Treatment Plan: RECTAL XELOX (CAPEOX) Q21D X 6 CYCLES          CURRENT THERAPY:  Neoadjuvant chemotherapy with Xeloda/Oxaliplatin starting 01/20/21  INTERVAL HISTORY:   James Coffey is here for a follow up of rectal cancer and interview was held with the use of [place] video interpreter . She is here for cycle 2 of oxaliplatin.  Notes that his second cycle of Xeloda has not been delivered yet.  Communicated with pharmacy and his delivery of the Xarelto was arranged.  He notes no other acute toxicities from his first cycle of treatment with [place] ox.  No rectal bleeding or discomfort.  His pelvic pain is much better.  His perineal wound continues to heal.  No fevers no chills no night sweats.  Good p.o. intake with increasing weight. Did not have his nausea medications which were resent to [place].  Patient requesting something to help him sleep.  Ativan is  a part of his supportive medications and he was encouraged to use this as needed..  No nausea no vomiting no diarrhea.  No symptoms suggestive of neuropathy. Notes good appetite with improving weight.  Notes he has gained 3 pounds in the last 3 weeks.Patient notes no other acute new concerns. Notes decreased rectal pain. No overt anxiety or depression.  He is motivated to get this treated. Still smoking about half a pack a day but is trying to cut down.  MEDICAL HISTORY:  Past Medical History:  Diagnosis Date   Cancer ([place])    Tobacco abuse     SURGICAL HISTORY: Past Surgical History:  Procedure Laterality Date   HERNIA REPAIR     INCISION AND DRAINAGE ABSCESS N/A 12/01/2020   Procedure: INCISION AND DRAINAGE ABSCESS;  Surgeon: Michael Boston, MD;  [facility];  Service: General;  Laterality: N/A;   INCISION AND DRAINAGE ABSCESS N/A 12/04/2020   Procedure: INCISION AND DRAINAGE FOURNIERS GANGRENE;  Surgeon: [name], MD;  [facility];  Service: General;  Laterality: N/A;   INCISION AND DRAINAGE PERIRECTAL ABSCESS N/A 11/30/2020   Procedure: INCISION AND DEBRIDEMENT OF PERIRECTAL ABSCESS AND RECTAL WALL BIOPSIES;  Surgeon: Michael Boston, MD;  [facility];  Service: General;  Laterality: N/A;   INGUINAL HERNIA REPAIR Right 2007   LAPAROSCOPIC LOOP COLOSTOMY N/A 12/13/2020   Procedure: DIAGNOSTIC LAPAROSCOPY WITH  OPEN DESCENDING LOOP COLOSTOMY;  Surgeon: Armandina Gemma, MD;  [facility];  Service: General;  Laterality: N/A;   RECTAL SURGERY      I have reviewed  the social history and family history with the patient and they are unchanged from previous note.  ALLERGIES:  has No Known Allergies.  MEDICATIONS:  Current Outpatient Medications  Medication Sig Dispense Refill   capecitabine (XELODA) 500 MG tablet Take 3 tablets (1,500 mg total) by mouth 2 (two) times daily after a meal for 14 days. Take for 14 days then 7 days off 126 tablet 2   acetaminophen  (TYLENOL) 500 MG tablet Take 2 tablets (1,000 mg total) by mouth every 8 (eight) hours as needed for mild pain or fever.     bacitracin ointment Apply topically daily. Apply to lateral flank wounds 120 g 0   dexamethasone (DECADRON) 4 MG tablet Take 2 tablets by mouth daily. Start the day after IV chemotherapy for 2 days. Take with food. 30 tablet 1   ferrous sulfate 325 (65 FE) MG tablet Take 1 tablet (325 mg total) by mouth daily with breakfast. 30 tablet 0   ibuprofen (ADVIL) 600 MG tablet Take 1 tablet (600 mg total) by mouth 3 (three) times daily. 30 tablet 0   LORazepam (ATIVAN) 0.5 MG tablet Take 1 tablet by mouth every 6 hours as needed (Nausea or vomiting). 30 tablet 0   methocarbamol (ROBAXIN) 500 MG tablet Take 1 tablet (500 mg total) by mouth every 8 (eight) hours as needed for muscle spasms. 30 tablet 0   ondansetron (ZOFRAN) 8 MG tablet Take 1 tablet by mouth 2 times daily as needed for refractory nausea/vomiting. Start on day 3 after chemotherapy. 30 tablet 1   potassium chloride SA (KLOR-CON) 20 MEQ tablet Take 1 tablet (20 mEq total) by mouth daily for 14 days. 14 tablet 0   prochlorperazine (COMPAZINE) 10 MG tablet Take 1 tablet by mouth every 6  hours as needed (Nausea or vomiting). 30 tablet 1   traMADol (ULTRAM) 50 MG tablet Take 1 tablet by mouth every 8 hours as needed. 30 tablet 0   No current facility-administered medications for this visit.    PHYSICAL EXAMINATION: ECOG PERFORMANCE STATUS: 1 - Symptomatic but completely ambulatory  Vitals:   02/11/21 1057  BP: 115/76  Pulse: 80  Resp: 19  Temp: 98.2 F (36.8 C)  SpO2: 100%   Wt Readings from Last 3 Encounters:  02/11/21 159 lb 11.2 oz (72.4 kg)  01/31/21 151 lb 3.2 oz (68.6 kg)  01/27/21 150 lb 3.2 oz (68.1 kg)   .[name] GENERAL:alert, in no acute distress and comfortable SKIN: no acute rashes, no significant lesions EYES: conjunctiva are pink and non-injected, sclera anicteric OROPHARYNX: MMM, no exudates, no  oropharyngeal erythema or ulceration NECK: supple, no JVD LYMPH:  no palpable lymphadenopathy in the cervical, axillary or inguinal regions LUNGS: clear to auscultation b/l with normal respiratory effort HEART: regular rate & rhythm ABDOMEN:  normoactive bowel sounds , non tender, not distended. Extremity: no pedal edema PSYCH: alert & oriented x 3 with fluent speech NEURO: no focal motor/sensory deficits   LABORATORY DATA:  I have reviewed the data as listed CBC Latest Ref Rng & Units 02/11/2021 01/27/2021 01/20/2021  WBC 4.0 - 10.5 K/uL 9.3 23.0(H) 24.8(H)  Hemoglobin 13.0 - 17.0 g/dL 12.8(L) 12.0(L) 12.2(L)  Hematocrit 39.0 - 52.0 % 38.4(L) 36.7(L) 38.4(L)  Platelets 150 - 400 K/uL 254 439(H) 464(H)     CMP Latest Ref Rng & Units 02/11/2021 01/27/2021 01/20/2021  Glucose 70 - 99 mg/dL 192(H) 153(H) 103(H)  BUN 6 - 20 mg/dL '10 12 9  ' Creatinine 0.61 - 1.24 mg/dL  0.77 0.83 0.70  Sodium 135 - 145 mmol/L 138 136 135  Potassium 3.5 - 5.1 mmol/L 3.9 4.0 3.9  Chloride 98 - 111 mmol/L 106 105 101  CO2 22 - 32 mmol/L 23 22 21(L)  Calcium 8.9 - 10.3 mg/dL 9.5 9.6 9.8  Total Protein 6.5 - 8.1 g/dL 7.1 7.7 8.2(H)  Total Bilirubin 0.3 - 1.2 mg/dL 0.4 0.5 0.3  Alkaline Phos 38 - 126 U/L 96 132(H) 125  AST 15 - 41 U/L 14(L) 15 16  ALT 0 - 44 U/L '16 28 21      ' RADIOGRAPHIC STUDIES: I have personally reviewed the radiological images as listed and agreed with the findings in the report. No results found.   ASSESSMENT & PLAN:  James Coffey is a 50 y.o. male with   1. Locally Advanced Rectal Adenocarcinoma, c(T3c, N2), MSI pending  -presented to ED on 11/29/20 with septic shock secondary to Fournier's gangrene -CT AP showed findings suspicious for necrotizing perineal soft tissue infection with irregular presacral nodule. Rectal biopsies obtained on 11/30/20 confirmed rectal adenocarcinoma. -Baseline CEA on 12/09/20 was elevated at 16.7. -He underwent diagnostic laparoscopy and open descending  loop colostomy on 12/13/20 with Dr. Harlow Asa. -Chest CT on 12/14/20 was negative for metastatic disease. -Repeat CEA on 01/06/21 showed further elevation to 52.55. -staging pelvis MRI on 01/12/21 showed: rectal adenocarcinoma T stage: At least T3c, possible involvement of prostatic apex; N stage: N2; long segment rectal tumor, involving anal sphincters. -Given locally advanced disease,he was recommended neoadjuvant Xeloda/Oxaliplatin (CAPOX) PLAN -Labs done today reviewed with the patient.  Stable. -CEA level has come down from 52-23.2 -Tolerated cycle 1 of [place] ox well thus far with no acute limiting toxicities. -Patient is stable and appropriate to proceed with cycle 2 of oxaliplatin and will start his oral Xeloda when he gets his prescription in the mail. -MMR was ordered on his pathology specimen. -Monitor for healing of his perineal wound  2. Rectal pain, insomnia He notes his rectal pain is better and he has not needed to take his tramadol much.  Had some possible rash from oxycodone. Plan -tramadol as needed  Follow-up Follow-up for second cycle of oxaliplatin tomorrow. Please schedule for cycle 3 and cycle 4 of oxaliplatin with port flush and labs. MD visit with cycle 3-day 1 and cycle 4-day 1. Social worker consultation for medication affordability and insurance questions.   . The total time spent in the appointment was 31 minutes and more than 50% was on counseling and direct patient cares.   Sullivan Lone MD MS Hematology/Oncology Physician [facility]

## 2021-02-21 ENCOUNTER — Other Ambulatory Visit (HOSPITAL_COMMUNITY): Payer: Self-pay

## 2021-02-28 NOTE — Progress Notes (Signed)
MR Glendenning's daughter [name] called stating that he has an appt with [facility] on 04/09/2021.

## 2021-03-04 ENCOUNTER — Other Ambulatory Visit: Payer: Self-pay

## 2021-03-04 DIAGNOSIS — C775 Secondary and unspecified malignant neoplasm of intrapelvic lymph nodes: Secondary | ICD-10-CM

## 2021-03-05 ENCOUNTER — Encounter: Payer: Self-pay | Admitting: Hematology

## 2021-03-05 ENCOUNTER — Inpatient Hospital Stay (HOSPITAL_BASED_OUTPATIENT_CLINIC_OR_DEPARTMENT_OTHER): Payer: Medicaid Other | Admitting: Hematology

## 2021-03-05 ENCOUNTER — Inpatient Hospital Stay: Payer: Medicaid Other

## 2021-03-05 ENCOUNTER — Other Ambulatory Visit: Payer: Self-pay

## 2021-03-05 ENCOUNTER — Other Ambulatory Visit (HOSPITAL_COMMUNITY): Payer: Self-pay

## 2021-03-05 VITALS — BP 113/81 | HR 76 | Temp 98.1°F | Resp 17 | Wt 165.8 lb

## 2021-03-05 DIAGNOSIS — C20 Malignant neoplasm of rectum: Secondary | ICD-10-CM | POA: Diagnosis not present

## 2021-03-05 DIAGNOSIS — Z95828 Presence of other vascular implants and grafts: Secondary | ICD-10-CM | POA: Insufficient documentation

## 2021-03-05 DIAGNOSIS — Z5111 Encounter for antineoplastic chemotherapy: Secondary | ICD-10-CM | POA: Diagnosis not present

## 2021-03-05 DIAGNOSIS — C775 Secondary and unspecified malignant neoplasm of intrapelvic lymph nodes: Secondary | ICD-10-CM

## 2021-03-05 LAB — CBC WITH DIFFERENTIAL (CANCER CENTER ONLY)
Abs Immature Granulocytes: 0.03 10*3/uL (ref 0.00–0.07)
Basophils Absolute: 0.1 10*3/uL (ref 0.0–0.1)
Basophils Relative: 1 %
Eosinophils Absolute: 0.3 10*3/uL (ref 0.0–0.5)
Eosinophils Relative: 3 %
HCT: 38.7 % — ABNORMAL LOW (ref 39.0–52.0)
Hemoglobin: 13.3 g/dL (ref 13.0–17.0)
Immature Granulocytes: 0 %
Lymphocytes Relative: 46 %
Lymphs Abs: 4.3 10*3/uL — ABNORMAL HIGH (ref 0.7–4.0)
MCH: 30.4 pg (ref 26.0–34.0)
MCHC: 34.4 g/dL (ref 30.0–36.0)
MCV: 88.6 fL (ref 80.0–100.0)
Monocytes Absolute: 0.9 10*3/uL (ref 0.1–1.0)
Monocytes Relative: 10 %
Neutro Abs: 3.8 10*3/uL (ref 1.7–7.7)
Neutrophils Relative %: 40 %
Platelet Count: 306 10*3/uL (ref 150–400)
RBC: 4.37 MIL/uL (ref 4.22–5.81)
RDW: 21.7 % — ABNORMAL HIGH (ref 11.5–15.5)
WBC Count: 9.4 10*3/uL (ref 4.0–10.5)
nRBC: 0 % (ref 0.0–0.2)

## 2021-03-05 LAB — CMP (CANCER CENTER ONLY)
ALT: 14 U/L (ref 0–44)
AST: 13 U/L — ABNORMAL LOW (ref 15–41)
Albumin: 3.7 g/dL (ref 3.5–5.0)
Alkaline Phosphatase: 98 U/L (ref 38–126)
Anion gap: 10 (ref 5–15)
BUN: 11 mg/dL (ref 6–20)
CO2: 21 mmol/L — ABNORMAL LOW (ref 22–32)
Calcium: 9.9 mg/dL (ref 8.9–10.3)
Chloride: 109 mmol/L (ref 98–111)
Creatinine: 0.74 mg/dL (ref 0.61–1.24)
GFR, Estimated: >60 mL/min (ref >60)
Glucose, Bld: 133 mg/dL — ABNORMAL HIGH (ref 70–99)
Potassium: 4 mmol/L (ref 3.5–5.1)
Sodium: 140 mmol/L (ref 135–145)
Total Bilirubin: 0.6 mg/dL (ref 0.3–1.2)
Total Protein: 7.3 g/dL (ref 6.5–8.1)

## 2021-03-05 LAB — VITAMIN B12: Vitamin B-12: 200 pg/mL (ref 180–914)

## 2021-03-05 LAB — FERRITIN: Ferritin: 105 ng/mL (ref 24–336)

## 2021-03-05 LAB — IRON AND TIBC
Iron: 58 ug/dL (ref 42–163)
Saturation Ratios: 14 % — ABNORMAL LOW (ref 20–55)
TIBC: 403 ug/dL (ref 202–409)
UIBC: 344 ug/dL (ref 117–376)

## 2021-03-05 MED ORDER — TRAMADOL HCL 50 MG PO TABS
50.0000 mg | ORAL_TABLET | Freq: Three times a day (TID) | ORAL | 0 refills | Status: DC | PRN
  Filled 2021-03-05: qty 30, 10d supply, fill #0

## 2021-03-05 MED ORDER — OXALIPLATIN CHEMO INJECTION 100 MG/20ML
130.0000 mg/m2 | Freq: Once | INTRAVENOUS | Status: AC
  Administered 2021-03-05: 240 mg via INTRAVENOUS
  Filled 2021-03-05: qty 40

## 2021-03-05 MED ORDER — DEXAMETHASONE 4 MG PO TABS
8.0000 mg | ORAL_TABLET | Freq: Every day | ORAL | 1 refills | Status: DC
  Filled 2021-03-05: qty 30, 15d supply, fill #0

## 2021-03-05 MED ORDER — PALONOSETRON HCL INJECTION 0.25 MG/5ML
0.2500 mg | Freq: Once | INTRAVENOUS | Status: AC
  Administered 2021-03-05: 0.25 mg via INTRAVENOUS

## 2021-03-05 MED ORDER — SODIUM CHLORIDE 0.9% FLUSH: 10.0000 mL | Freq: Once | INTRAVENOUS | Status: AC

## 2021-03-05 MED ORDER — SODIUM CHLORIDE 0.9 % IV SOLN
10.0000 mg | Freq: Once | INTRAVENOUS | Status: AC
  Administered 2021-03-05: 10 mg via INTRAVENOUS
  Filled 2021-03-05: qty 10

## 2021-03-05 MED ORDER — PALONOSETRON HCL INJECTION 0.25 MG/5ML
INTRAVENOUS | Status: AC
  Filled 2021-03-05: qty 5

## 2021-03-05 MED ORDER — DEXTROSE 5 % IV SOLN: Freq: Once | INTRAVENOUS | Status: AC

## 2021-03-05 MED ORDER — LORAZEPAM 0.5 MG PO TABS
0.5000 mg | ORAL_TABLET | Freq: Three times a day (TID) | ORAL | 0 refills | Status: DC | PRN
  Filled 2021-03-05: qty 60, 20d supply, fill #0

## 2021-03-05 MED ORDER — ONDANSETRON HCL 8 MG PO TABS
8.0000 mg | ORAL_TABLET | Freq: Two times a day (BID) | ORAL | 1 refills | Status: DC | PRN
  Filled 2021-03-05: qty 30, 15d supply, fill #0

## 2021-03-05 NOTE — Patient Instructions (Signed)
[facility]  Discharge Instructions: Thank you for choosing [facility] to provide your oncology and hematology care.   If you have a lab appointment with the Cancer Center, please go directly to the Cancer Center and check in at the registration area.   Wear comfortable clothing and clothing appropriate for easy access to any Portacath or PICC line.   We strive to give you quality time with your provider. You may need to reschedule your appointment if you arrive late (15 or more minutes).  Arriving late affects you and other patients whose appointments are after yours.  Also, if you miss three or more appointments without notifying the office, you may be dismissed from the clinic at the provider's discretion.      For prescription refill requests, have your pharmacy contact our office and allow 72 hours for refills to be completed.    Today you received the following chemotherapy and/or immunotherapy agents: Oxaliplatin.       To help prevent nausea and vomiting after your treatment, we encourage you to take your nausea medication as directed.  BELOW ARE SYMPTOMS THAT SHOULD BE REPORTED IMMEDIATELY: *FEVER GREATER THAN 100.4 F (38 C) OR HIGHER *CHILLS OR SWEATING *NAUSEA AND VOMITING THAT IS NOT CONTROLLED WITH YOUR NAUSEA MEDICATION *UNUSUAL SHORTNESS OF BREATH *UNUSUAL BRUISING OR BLEEDING *URINARY PROBLEMS (pain or burning when urinating, or frequent urination) *BOWEL PROBLEMS (unusual diarrhea, constipation, pain near the anus) TENDERNESS IN MOUTH AND THROAT WITH OR WITHOUT PRESENCE OF ULCERS (sore throat, sores in mouth, or a toothache) UNUSUAL RASH, SWELLING OR PAIN  UNUSUAL VAGINAL DISCHARGE OR ITCHING   Items with * indicate a potential emergency and should be followed up as soon as possible or go to the Emergency Department if any problems should occur.  Please show the CHEMOTHERAPY ALERT CARD or IMMUNOTHERAPY ALERT CARD at check-in  to the Emergency Department and triage nurse.  Should you have questions after your visit or need to cancel or reschedule your appointment, please contact [facility]  Dept: [phone]  and follow the prompts.  Office hours are 8:00 a.m. to 4:30 p.m. Monday - Friday. Please note that voicemails left after 4:00 p.m. may not be returned until the following business day.  We are closed weekends and major holidays. You have access to a nurse at all times for urgent questions. Please call the main number to the clinic Dept: [phone] and follow the prompts.   For any non-urgent questions, you may also contact your provider using MyChart. We now offer e-Visits for anyone 18 and older to request care online for non-urgent symptoms. For details visit [email or web address].   Also download the MyChart app! Go to the app store, search "MyChart", open the app, select , and log in with your MyChart username and password.  Due to Covid, a mask is required upon entering the hospital/clinic. If you do not have a mask, one will be given to you upon arrival. For doctor visits, patients may have 1 support person aged 18 or older with them. For treatment visits, patients cannot have anyone with them due to current Covid guidelines and our immunocompromised population.   

## 2021-03-10 ENCOUNTER — Other Ambulatory Visit: Payer: Self-pay | Admitting: *Deleted

## 2021-03-10 ENCOUNTER — Encounter: Payer: Self-pay | Admitting: Hematology

## 2021-03-10 ENCOUNTER — Other Ambulatory Visit (HOSPITAL_COMMUNITY): Payer: Self-pay

## 2021-03-10 DIAGNOSIS — C775 Secondary and unspecified malignant neoplasm of intrapelvic lymph nodes: Secondary | ICD-10-CM

## 2021-03-10 DIAGNOSIS — C20 Malignant neoplasm of rectum: Secondary | ICD-10-CM

## 2021-03-10 MED ORDER — PROCHLORPERAZINE MALEATE 10 MG PO TABS
10.0000 mg | ORAL_TABLET | Freq: Four times a day (QID) | ORAL | 1 refills | Status: DC | PRN
  Filled 2021-03-10: qty 30, 8d supply, fill #0

## 2021-03-11 NOTE — Progress Notes (Signed)
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[place])  12/17/2020 Initial Diagnosis   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[place])   01/13/2021 Cancer Staging   Staging form: Colon and Rectum, AJCC 8th Edition - Clinical stage from 01/13/2021: Stage IIIB (cT3, cN2a, cM0) - Signed by [name], MD on 01/19/2021 Stage prefix: Initial diagnosis Histologic grade (G): G2 Histologic grading system: 4 grade system   01/20/2021 -  Chemotherapy   Patient is on Treatment Plan : RECTAL Xelox ([place]) q21d x 6 cycles        CURRENT THERAPY:  Neoadjuvant chemotherapy with Xeloda/Oxaliplatin starting 01/20/21  INTERVAL HISTORY:   James Coffey is here for a follow up of rectal cancer and interview was held with the use of [place] video interpreter . She is here for cycle 3 of oxaliplatin.    He notes no acute toxicities with his second cycle of [place] ox.  No diarrhea no nausea no vomiting.  Minimal grade 1 cold induced neuropathy which is otherwise resolved.  Perineal wounds have nearly healed up completely. No infection issues. Continues to eat well and is gaining back his weight.  Sleeping better with the Ativan.  Notes decreased rectal pain. No overt anxiety or depression.  He is motivated to get this treated. Still smoking about half a pack a day but is trying to cut down.  MEDICAL HISTORY:  Past Medical History:  Diagnosis Date    Cancer ([place])    Tobacco abuse     SURGICAL HISTORY: Past Surgical History:  Procedure Laterality Date   HERNIA REPAIR     INCISION AND DRAINAGE ABSCESS N/A 12/01/2020   Procedure: INCISION AND DRAINAGE ABSCESS;  Surgeon: Michael Boston, MD;  Location: [facility];  Service: General;  Laterality: N/A;   INCISION AND DRAINAGE ABSCESS N/A 12/04/2020   Procedure: INCISION AND DRAINAGE FOURNIERS GANGRENE;  Surgeon: [name], MD;  Location: [facility];  Service: General;  Laterality: N/A;   INCISION AND DRAINAGE PERIRECTAL ABSCESS N/A 11/30/2020   Procedure: INCISION AND DEBRIDEMENT OF PERIRECTAL ABSCESS AND RECTAL WALL BIOPSIES;  Surgeon: Michael Boston, MD;  Location: [facility];  Service: General;  Laterality: N/A;   INGUINAL HERNIA REPAIR Right 2007   LAPAROSCOPIC LOOP COLOSTOMY N/A 12/13/2020   Procedure: DIAGNOSTIC LAPAROSCOPY WITH  OPEN DESCENDING LOOP COLOSTOMY;  Surgeon: Armandina Gemma, MD;  Location: [facility];  Service: General;  Laterality: N/A;   RECTAL SURGERY      I have reviewed the social history and family history with the patient and they are unchanged from previous note.  ALLERGIES:  has No Known Allergies.  MEDICATIONS:  Current Outpatient Medications  Medication Sig Dispense Refill   acetaminophen (TYLENOL) 500 MG tablet Take 2 tablets (1,000 mg total) by mouth every 8 (eight) hours as needed for mild pain or fever.     bacitracin ointment Apply topically daily. Apply to lateral flank wounds 120 g 0   dexamethasone (DECADRON) 4 MG tablet Take 2 tablets by mouth daily. Start the day after  IV chemotherapy for 2 days. Take with food. 30 tablet 1   ferrous sulfate 325 (65 FE) MG tablet Take 1 tablet (325 mg total) by mouth daily with breakfast. 30 tablet 0   ibuprofen (ADVIL) 600 MG tablet Take 1 tablet (600 mg total) by mouth 3 (three) times daily. 30 tablet 0   LORazepam (ATIVAN) 0.5 MG tablet Take 1 tablet by mouth every 8 (eight) hours as needed for sleep or anxiety. 60 tablet 0    methocarbamol (ROBAXIN) 500 MG tablet Take 1 tablet (500 mg total) by mouth every 8 (eight) hours as needed for muscle spasms. 30 tablet 0   ondansetron (ZOFRAN) 8 MG tablet Take 1 tablet by mouth 2 times daily as needed for refractory nausea/vomiting. Start on day 3 after chemotherapy. 30 tablet 1   potassium chloride SA (KLOR-CON) 20 MEQ tablet Take 1 tablet (20 mEq total) by mouth daily for 14 days. 14 tablet 0   prochlorperazine (COMPAZINE) 10 MG tablet Take 1 tablet by mouth every 6  hours as needed for nausea or vomiting. 30 tablet 1   traMADol (ULTRAM) 50 MG tablet Take 1 tablet by mouth every 8 hours as needed. 30 tablet 0   No current facility-administered medications for this visit.   Facility-Administered Medications Ordered in Other Visits  Medication Dose Route Frequency Provider Last Rate Last Admin   palonosetron (ALOXI) 0.25 MG/5ML injection            sodium chloride flush (NS) 0.9 % injection 10 mL  10 mL Intracatheter Once [name], MD        PHYSICAL EXAMINATION: ECOG PERFORMANCE STATUS: 1 - Symptomatic but completely ambulatory  Vitals:   03/05/21 1137  BP: 113/81  Pulse: 76  Resp: 17  Temp: 98.1 F (36.7 C)  SpO2: 99%   Wt Readings from Last 3 Encounters:  03/05/21 165 lb 12.8 oz (75.2 kg)  02/11/21 159 lb 11.2 oz (72.4 kg)  01/31/21 151 lb 3.2 oz (68.6 kg)   ... GENERAL:alert, in no acute distress and comfortable SKIN: no acute rashes, no significant lesions EYES: conjunctiva are pink and non-injected, sclera anicteric OROPHARYNX: MMM, no exudates, no oropharyngeal erythema or ulceration NECK: supple, no JVD LYMPH:  no palpable lymphadenopathy in the cervical, axillary or inguinal regions LUNGS: clear to auscultation b/l with normal respiratory effort HEART: regular rate & rhythm ABDOMEN:  normoactive bowel sounds , non tender, not distended. Extremity: no pedal edema PSYCH: alert & oriented x 3 with fluent speech NEURO: no focal  motor/sensory deficits    LABORATORY DATA:  I have reviewed the data as listed CBC Latest Ref Rng & Units 03/05/2021 02/11/2021 01/27/2021  WBC 4.0 - 10.5 K/uL 9.4 9.3 23.0(H)  Hemoglobin 13.0 - 17.0 g/dL 13.3 12.8(L) 12.0(L)  Hematocrit 39.0 - 52.0 % 38.7(L) 38.4(L) 36.7(L)  Platelets 150 - 400 K/uL 306 254 439(H)     CMP Latest Ref Rng & Units 03/05/2021 02/11/2021 01/27/2021  Glucose 70 - 99 mg/dL 133(H) 192(H) 153(H)  BUN 6 - 20 mg/dL '11 10 12  ' Creatinine 0.61 - 1.24 mg/dL 0.74 0.77 0.83  Sodium 135 - 145 mmol/L 140 138 136  Potassium 3.5 - 5.1 mmol/L 4.0 3.9 4.0  Chloride 98 - 111 mmol/L 109 106 105  CO2 22 - 32 mmol/L 21(L) 23 22  Calcium 8.9 - 10.3 mg/dL 9.9 9.5 9.6  Total Protein 6.5 - 8.1 g/dL 7.3 7.1 7.7  Total Bilirubin 0.3 - 1.2 mg/dL 0.6 0.4 0.5  Alkaline Phos 38 - 126 U/L 98 96 132(H)  AST 15 - 41 U/L 13(L) 14(L) 15  ALT 0 - 44 U/L '14 16 28   ' Component     Latest Ref Rng & Units 03/05/2021  Iron     42 - 163 ug/dL 58  TIBC     202 - 409 ug/dL 403  Saturation Ratios     20 - 55 % 14 (L)  UIBC     117 - 376 ug/dL 344  Vitamin B12     180 - 914 pg/mL 200  Ferritin     24 - 336 ng/mL 105     RADIOGRAPHIC STUDIES: I have personally reviewed the radiological images as listed and agreed with the findings in the report. No results found.   ASSESSMENT & PLAN:  Gianpaolo Mindel is a 50 y.o. male with   1. Locally Advanced Rectal Adenocarcinoma, c(T3c, N2), MSI pending  -presented to ED on 11/29/20 with septic shock secondary to Fournier's gangrene -CT AP showed findings suspicious for necrotizing perineal soft tissue infection with irregular presacral nodule. Rectal biopsies obtained on 11/30/20 confirmed rectal adenocarcinoma. -Baseline CEA on 12/09/20 was elevated at 16.7. -He underwent diagnostic laparoscopy and open descending loop colostomy on 12/13/20 with Dr. Harlow Asa. -Chest CT on 12/14/20 was negative for metastatic disease. -Repeat CEA on 01/06/21 showed further  elevation to 52.55. -staging pelvis MRI on 01/12/21 showed: rectal adenocarcinoma T stage: At least T3c, possible involvement of prostatic apex; N stage: N2; long segment rectal tumor, involving anal sphincters. -Given locally advanced disease,he was recommended neoadjuvant Xeloda/Oxaliplatin (CAPOX) PLAN -Labs done today reviewed with the patient.  Stable. -Tolerated cycle 2 of [place] ox well thus far with no acute limiting toxicities. -Patient is stable and appropriate to proceed with cycle 3 of oxaliplatin and Xeloda. -Monitor for healing of his perineal wound -will plan to rpt CT chest/abd/pelvis after C4of treatment and refer to Rad onc for concurrent chemo-rad if all is stable/ 2. Rectal pain, insomnia He notes his rectal pain is better and he has not needed to take his tramadol much.  Had some possible rash from oxycodone. Plan -tramadol as needed  Follow-up Please schedule for cycle 4 of oxaliplatin with port flush and labs. MD visit with cycle 4-day 1 in 3 weeks  . The total time spent in the appointment was 32 minutes and more than 50% was on counseling and direct patient cares.  Sullivan Lone MD MS Hematology/Oncology Physician [facility]

## 2021-03-12 ENCOUNTER — Encounter: Payer: Self-pay | Admitting: Hematology

## 2021-03-18 ENCOUNTER — Telehealth: Payer: Self-pay | Admitting: General Practice

## 2021-03-18 NOTE — Telephone Encounter (Signed)
[facility] CSW Progress Notes  Email from [employer].  They have been unable to reach patient or daughter in order to assist w application for Social Security disability  Requested our assistance in reaching patient/family.  Called daughter, no answer, left VM w my contact information and encouragement to call back.  [facility] contact number is [phone].  Edwyna Shell, LCSW Clinical Social Worker Phone:  [phone]

## 2021-03-26 ENCOUNTER — Inpatient Hospital Stay: Payer: Medicaid Other

## 2021-03-26 ENCOUNTER — Inpatient Hospital Stay (HOSPITAL_BASED_OUTPATIENT_CLINIC_OR_DEPARTMENT_OTHER): Payer: Medicaid Other | Admitting: Hematology

## 2021-03-26 ENCOUNTER — Inpatient Hospital Stay: Payer: Medicaid Other | Attending: Hematology

## 2021-03-26 ENCOUNTER — Other Ambulatory Visit: Payer: Self-pay

## 2021-03-26 ENCOUNTER — Encounter: Payer: Self-pay | Admitting: Hematology

## 2021-03-26 ENCOUNTER — Other Ambulatory Visit (HOSPITAL_COMMUNITY): Payer: Self-pay

## 2021-03-26 VITALS — BP 94/69 | HR 72 | Temp 97.8°F | Resp 17 | Ht 77.0 in | Wt 168.9 lb

## 2021-03-26 DIAGNOSIS — C775 Secondary and unspecified malignant neoplasm of intrapelvic lymph nodes: Secondary | ICD-10-CM | POA: Diagnosis not present

## 2021-03-26 DIAGNOSIS — C20 Malignant neoplasm of rectum: Secondary | ICD-10-CM

## 2021-03-26 DIAGNOSIS — Z5111 Encounter for antineoplastic chemotherapy: Secondary | ICD-10-CM | POA: Insufficient documentation

## 2021-03-26 LAB — CBC WITH DIFFERENTIAL (CANCER CENTER ONLY)
Abs Immature Granulocytes: 0.02 10*3/uL (ref 0.00–0.07)
Basophils Absolute: 0.1 10*3/uL (ref 0.0–0.1)
Basophils Relative: 1 %
Eosinophils Absolute: 0.3 10*3/uL (ref 0.0–0.5)
Eosinophils Relative: 3 %
HCT: 37.2 % — ABNORMAL LOW (ref 39.0–52.0)
Hemoglobin: 12.9 g/dL — ABNORMAL LOW (ref 13.0–17.0)
Immature Granulocytes: 0 %
Lymphocytes Relative: 39 %
Lymphs Abs: 3.1 10*3/uL (ref 0.7–4.0)
MCH: 31.4 pg (ref 26.0–34.0)
MCHC: 34.7 g/dL (ref 30.0–36.0)
MCV: 90.5 fL (ref 80.0–100.0)
Monocytes Absolute: 1.2 10*3/uL — ABNORMAL HIGH (ref 0.1–1.0)
Monocytes Relative: 15 %
Neutro Abs: 3.3 10*3/uL (ref 1.7–7.7)
Neutrophils Relative %: 42 %
Platelet Count: 166 10*3/uL (ref 150–400)
RBC: 4.11 MIL/uL — ABNORMAL LOW (ref 4.22–5.81)
RDW: 23.4 % — ABNORMAL HIGH (ref 11.5–15.5)
WBC Count: 8 10*3/uL (ref 4.0–10.5)
nRBC: 0 % (ref 0.0–0.2)

## 2021-03-26 LAB — CMP (CANCER CENTER ONLY)
ALT: 12 U/L (ref 0–44)
AST: 13 U/L — ABNORMAL LOW (ref 15–41)
Albumin: 3.7 g/dL (ref 3.5–5.0)
Alkaline Phosphatase: 93 U/L (ref 38–126)
Anion gap: 9 (ref 5–15)
BUN: 12 mg/dL (ref 6–20)
CO2: 19 mmol/L — ABNORMAL LOW (ref 22–32)
Calcium: 9.4 mg/dL (ref 8.9–10.3)
Chloride: 112 mmol/L — ABNORMAL HIGH (ref 98–111)
Creatinine: 0.72 mg/dL (ref 0.61–1.24)
GFR, Estimated: >60 mL/min (ref >60)
Glucose, Bld: 148 mg/dL — ABNORMAL HIGH (ref 70–99)
Potassium: 3.8 mmol/L (ref 3.5–5.1)
Sodium: 140 mmol/L (ref 135–145)
Total Bilirubin: 1.4 mg/dL — ABNORMAL HIGH (ref 0.3–1.2)
Total Protein: 7 g/dL (ref 6.5–8.1)

## 2021-03-26 LAB — IRON AND TIBC
Iron: 180 ug/dL — ABNORMAL HIGH (ref 42–163)
Saturation Ratios: 44 % (ref 20–55)
TIBC: 408 ug/dL (ref 202–409)
UIBC: 228 ug/dL (ref 117–376)

## 2021-03-26 LAB — FERRITIN: Ferritin: 99 ng/mL (ref 24–336)

## 2021-03-26 LAB — VITAMIN B12: Vitamin B-12: 338 pg/mL (ref 180–914)

## 2021-03-26 MED ORDER — OXALIPLATIN CHEMO INJECTION 100 MG/20ML
130.0000 mg/m2 | Freq: Once | INTRAVENOUS | Status: AC
  Administered 2021-03-26: 240 mg via INTRAVENOUS
  Filled 2021-03-26: qty 40

## 2021-03-26 MED ORDER — DEXTROSE 5 % IV SOLN: Freq: Once | INTRAVENOUS | Status: AC

## 2021-03-26 MED ORDER — SODIUM CHLORIDE 0.9 % IV SOLN
10.0000 mg | Freq: Once | INTRAVENOUS | Status: AC
  Administered 2021-03-26: 10 mg via INTRAVENOUS
  Filled 2021-03-26: qty 10

## 2021-03-26 MED ORDER — PALONOSETRON HCL INJECTION 0.25 MG/5ML
0.2500 mg | Freq: Once | INTRAVENOUS | Status: AC
  Administered 2021-03-26: 0.25 mg via INTRAVENOUS
  Filled 2021-03-26: qty 5

## 2021-03-26 MED ORDER — LORAZEPAM 0.5 MG PO TABS
0.5000 mg | ORAL_TABLET | Freq: Three times a day (TID) | ORAL | 0 refills | Status: DC | PRN
  Filled 2021-03-26: qty 60, 20d supply, fill #0

## 2021-03-26 MED ORDER — TRAMADOL HCL 50 MG PO TABS
50.0000 mg | ORAL_TABLET | Freq: Three times a day (TID) | ORAL | 0 refills | Status: DC | PRN
  Filled 2021-03-26: qty 30, 10d supply, fill #0

## 2021-03-26 MED ORDER — PROCHLORPERAZINE MALEATE 10 MG PO TABS
10.0000 mg | ORAL_TABLET | Freq: Four times a day (QID) | ORAL | 1 refills | Status: DC | PRN
  Filled 2021-03-26: qty 30, 8d supply, fill #0
  Filled 2021-04-04: qty 30, 8d supply, fill #1

## 2021-03-26 NOTE — Patient Instructions (Signed)
[facility] ONCOL[facility]   Discharge Instructions: Thank you for choosing [facility] to provide your oncology and hematology care.   If you have a lab appointment with the [facility], please go directly to the [name] and check in at the registration area.   Wear comfortable clothing and clothing appropriate for easy access to any Portacath or PICC line.   We strive to give you quality time with your provider. You may need to reschedule your appointment if you arrive late (15 or more minutes).  Arriving late affects you and other patients whose appointments are after yours.  Also, if you miss three or more appointments without notifying the office, you may be dismissed from the clinic at the provider's discretion.      For prescription refill requests, have your pharmacy contact our office and allow 72 hours for refills to be completed.    Today you received the following chemotherapy and/or immunotherapy agents: Oxaliplatin (Eloxatin).      To help prevent nausea and vomiting after your treatment, we encourage you to take your nausea medication as directed.  BELOW ARE SYMPTOMS THAT SHOULD BE REPORTED IMMEDIATELY: *FEVER GREATER THAN 100.4 F (38 C) OR HIGHER *CHILLS OR SWEATING *NAUSEA AND VOMITING THAT IS NOT CONTROLLED WITH YOUR NAUSEA MEDICATION *UNUSUAL SHORTNESS OF BREATH *UNUSUAL BRUISING OR BLEEDING *URINARY PROBLEMS (pain or burning when urinating, or frequent urination) *BOWEL PROBLEMS (unusual diarrhea, constipation, pain near the anus) TENDERNESS IN MOUTH AND THROAT WITH OR WITHOUT PRESENCE OF ULCERS (sore throat, sores in mouth, or a toothache) UNUSUAL RASH, SWELLING OR PAIN  UNUSUAL VAGINAL DISCHARGE OR ITCHING   Items with * indicate a potential emergency and should be followed up as soon as possible or go to the Emergency Department if any problems should occur.  Please show the CHEMOTHERAPY ALERT CARD or IMMUNOTHERAPY ALERT CARD  at check-in to the Emergency Department and triage nurse.  Should you have questions after your visit or need to cancel or reschedule your appointment, please contact [facility]  Dept: [phone]  and follow the prompts.  Office hours are 8:00 a.m. to 4:30 p.m. Monday - Friday. Please note that voicemails left after 4:00 p.m. may not be returned until the following business day.  We are closed weekends and major holidays. You have access to a nurse at all times for urgent questions. Please call the main number to the clinic Dept: [phone] and follow the prompts.   For any non-urgent questions, you may also contact your provider using MyChart. We now offer e-Visits for anyone 21 and older to request care online for non-urgent symptoms. For details visit [email or web address].   Also download the MyChart app! Go to the app store, search "MyChart", open the app, select [facility], and log in with your MyChart username and password.  Due to Covid, a mask is required upon entering the hospital/clinic. If you do not have a mask, one will be given to you upon arrival. For doctor visits, patients may have 1 support person aged 61 or older with them. For treatment visits, patients cannot have anyone with them due to current Covid guidelines and our immunocompromised population.

## 2021-04-01 ENCOUNTER — Encounter: Payer: Self-pay | Admitting: Hematology

## 2021-04-01 NOTE — Progress Notes (Addendum)
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[place])  12/17/2020 Initial Diagnosis   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[place])   01/13/2021 Cancer Staging   Staging form: Colon and Rectum, AJCC 8th Edition - Clinical stage from 01/13/2021: Stage IIIB (cT3, cN2a, cM0) - Signed by [name], MD on 01/19/2021 Stage prefix: Initial diagnosis Histologic grade (G): G2 Histologic grading system: 4 grade system    01/20/2021 -  Chemotherapy   Patient is on Treatment Plan : RECTAL Xelox ([place]ox) q21d x 6 cycles        CURRENT THERAPY:  Neoadjuvant chemotherapy with Xeloda/Oxaliplatin starting 01/20/21  INTERVAL HISTORY:   James Coffey is here for a follow up of rectal cancer and interview was held with the use of [place] video interpreter . He is here for cycle 4 of oxaliplatin.    He notes no acute toxicities with his 3rd cycle of [place] ox.  No diarrhea no nausea no vomiting.  Minimal grade 1 cold induced neuropathy which is otherwise resolved. No fevers no chills.  No other infection issues. Perineal wounds have nearly completely healed up. Some grade 1-2 nausea which is controlled with antinausea medications he has. Sleeping better with the Ativan.  Patient notes no significant rectal pain. No overt anxiety or depression.   Notes that he is continuing to cut down on his smoking.  MEDICAL HISTORY:  Past Medical History:   Diagnosis Date   Cancer ([place])    Tobacco abuse     SURGICAL HISTORY: Past Surgical History:  Procedure Laterality Date   HERNIA REPAIR     INCISION AND DRAINAGE ABSCESS N/A 12/01/2020   Procedure: INCISION AND DRAINAGE ABSCESS;  Surgeon: Michael Boston, MD;  Location: [facility];  Service: General;  Laterality: N/A;   INCISION AND DRAINAGE ABSCESS N/A 12/04/2020   Procedure: INCISION AND DRAINAGE FOURNIERS GANGRENE;  Surgeon: [name], MD;  Location: [facility];  Service: General;  Laterality: N/A;   INCISION AND DRAINAGE PERIRECTAL ABSCESS N/A 11/30/2020   Procedure: INCISION AND DEBRIDEMENT OF PERIRECTAL ABSCESS AND RECTAL WALL BIOPSIES;  Surgeon: Michael Boston, MD;  Location: [facility];  Service: General;  Laterality: N/A;   INGUINAL HERNIA REPAIR Right 2007   LAPAROSCOPIC LOOP COLOSTOMY N/A 12/13/2020   Procedure: DIAGNOSTIC LAPAROSCOPY WITH  OPEN DESCENDING LOOP COLOSTOMY;  Surgeon: Armandina Gemma, MD;  Location: [facility];  Service: General;  Laterality: N/A;   RECTAL SURGERY      I have reviewed the social history and family history with the patient and they are unchanged from previous note.  ALLERGIES:  has No Known Allergies.  MEDICATIONS:  Current Outpatient Medications  Medication Sig Dispense Refill   acetaminophen (TYLENOL) 500 MG tablet Take 2 tablets (1,000 mg total) by mouth every 8 (eight) hours as needed for mild pain or fever.     bacitracin ointment Apply topically daily. Apply to lateral flank wounds 120 g 0   dexamethasone (DECADRON) 4 MG tablet Take 2 tablets by mouth daily. Start the day after  IV chemotherapy for 2 days. Take with food. 30 tablet 1   ferrous sulfate 325 (65 FE) MG tablet Take 1 tablet (325 mg total) by mouth daily with breakfast. 30 tablet 0   ibuprofen (ADVIL) 600 MG tablet Take 1 tablet (600 mg total) by mouth 3 (three) times daily. 30 tablet 0   LORazepam (ATIVAN) 0.5 MG tablet Take 1 tablet by mouth every 8 hours as needed for sleep or anxiety. 60  tablet 0   methocarbamol (ROBAXIN) 500 MG tablet Take 1 tablet (500 mg total) by mouth every 8 (eight) hours as needed for muscle spasms. 30 tablet 0   ondansetron (ZOFRAN) 8 MG tablet Take 1 tablet by mouth 2 times daily as needed for refractory nausea/vomiting. Start on day 3 after chemotherapy. 30 tablet 1   potassium chloride SA (KLOR-CON) 20 MEQ tablet Take 1 tablet (20 mEq total) by mouth daily for 14 days. 14 tablet 0   prochlorperazine (COMPAZINE) 10 MG tablet Take 1 tablet by mouth every 6  hours as needed for nausea or vomiting. 30 tablet 1   traMADol (ULTRAM) 50 MG tablet Take 1 tablet by mouth every 8 hours as needed. 30 tablet 0   No current facility-administered medications for this visit.   Facility-Administered Medications Ordered in Other Visits  Medication Dose Route Frequency Provider Last Rate Last Admin   palonosetron (ALOXI) 0.25 MG/5ML injection            sodium chloride flush (NS) 0.9 % injection 10 mL  10 mL Intracatheter Once [name], MD        PHYSICAL EXAMINATION: ECOG PERFORMANCE STATUS: 1 - Symptomatic but completely ambulatory  Vitals:   03/26/21 1053  BP: 94/69  Pulse: 72  Resp: 17  Temp: 97.8 F (36.6 C)  SpO2: 100%   Wt Readings from Last 3 Encounters:  03/26/21 168 lb 14.4 oz (76.6 kg)  03/05/21 165 lb 12.8 oz (75.2 kg)  02/11/21 159 lb 11.2 oz (72.4 kg)  . GENERAL:alert, in no acute distress and comfortable SKIN: no acute rashes, no significant lesions EYES: conjunctiva are pink and non-injected, sclera anicteric OROPHARYNX: MMM, no exudates, no oropharyngeal erythema or ulceration NECK: supple, no JVD LYMPH:  no palpable lymphadenopathy in the cervical, axillary or inguinal regions LUNGS: clear to auscultation b/l with normal respiratory effort HEART: regular rate & rhythm ABDOMEN:  normoactive bowel sounds , non tender, not distended. Extremity: no pedal edema PSYCH: alert & oriented x 3 with fluent speech NEURO: no focal  motor/sensory deficits     LABORATORY DATA:  I have reviewed the data as listed CBC Latest Ref Rng & Units 03/26/2021 03/05/2021 02/11/2021  WBC 4.0 - 10.5 K/uL 8.0 9.4 9.3  Hemoglobin 13.0 - 17.0 g/dL 12.9(L) 13.3 12.8(L)  Hematocrit 39.0 - 52.0 % 37.2(L) 38.7(L) 38.4(L)  Platelets 150 - 400 K/uL 166 306 254     CMP Latest Ref Rng & Units 03/26/2021 03/05/2021 02/11/2021  Glucose 70 - 99 mg/dL 148(H) 133(H) 192(H)  BUN 6 - 20 mg/dL '12 11 10  ' Creatinine 0.61 - 1.24 mg/dL 0.72 0.74 0.77  Sodium 135 - 145 mmol/L 140 140 138  Potassium 3.5 - 5.1 mmol/L 3.8 4.0 3.9  Chloride 98 - 111 mmol/L 112(H) 109 106  CO2 22 - 32 mmol/L 19(L) 21(L) 23  Calcium 8.9 - 10.3 mg/dL 9.4 9.9 9.5  Total Protein 6.5 - 8.1 g/dL 7.0 7.3 7.1  Total Bilirubin 0.3 - 1.2 mg/dL 1.4(H) 0.6 0.4  Alkaline Phos 38 - 126 U/L 93 98 96  AST 15 - 41 U/L 13(L) 13(L) 14(L)  ALT 0 - 44 U/L '12 14 16   ' . Lab Results  Component Value Date   IRON 180 (H) 03/26/2021   TIBC 408 03/26/2021   IRONPCTSAT 44 03/26/2021   (Iron and TIBC)  Lab Results  Component Value Date   FERRITIN 99 03/26/2021      RADIOGRAPHIC STUDIES: I have personally reviewed the radiological images as listed and agreed with the findings in the report. No results found.   ASSESSMENT & PLAN:  James Coffey is a 50 y.o. male with   1. Locally Advanced Rectal Adenocarcinoma, c(T3c, N2), MSI pending  -presented to ED on 11/29/20 with septic shock secondary to Fournier's gangrene -CT AP showed findings suspicious for necrotizing perineal soft tissue infection with irregular presacral nodule. Rectal biopsies obtained on 11/30/20 confirmed rectal adenocarcinoma. -Baseline CEA on 12/09/20 was elevated at 16.7. -He underwent diagnostic laparoscopy and open descending loop colostomy on 12/13/20 with Dr. Harlow Asa. -Chest CT on 12/14/20 was negative for metastatic disease. -Repeat CEA on 01/06/21 showed further elevation to 52.55. -staging pelvis MRI on 01/12/21  showed: rectal adenocarcinoma T stage: At least T3c, possible involvement of prostatic apex; N stage: N2; long segment rectal tumor, involving anal sphincters. -Given locally advanced disease,he was recommended neoadjuvant Xeloda/Oxaliplatin (CAPOX) PLAN -Labs done today reviewed with the patient.  Stable. -Tolerated cycle 3 of [place] ox well thus far with no acute limiting toxicities. -Patient is stable and appropriate to proceed with cycle 4 of oxaliplatin and Xeloda with same supportive medications. -Monitor for healing of his perineal wound -Continue to emphasize on smoking cessation -will plan to rpt CT chest/abd/pelvis after C4 of treatment and refer to Rad onc for concurrent chemo-rad if all is stable/ 2. Rectal pain- nearly resolved -tramadol prn 3 insomnia -lorazepam prn  Follow-up CT chest abdomen pelvis in 2weeks Labs and 2weeks Return to clinic with Dr. Irene Limbo in 3 weeks  . The total time spent in the appointment was 30 minutes and more than 50% was on counseling and direct patient cares.   Sullivan Lone MD MS Hematology/Oncology Physician [facility].

## 2021-04-03 NOTE — Addendum Note (Signed)
Addended by: Sullivan Lone on: 04/03/2021 12:48 PM   Modules accepted: Orders

## 2021-04-04 ENCOUNTER — Encounter: Payer: Self-pay | Admitting: Hematology

## 2021-04-04 ENCOUNTER — Other Ambulatory Visit (HOSPITAL_COMMUNITY): Payer: Self-pay

## 2021-04-15 MED FILL — Dexamethasone Sodium Phosphate Inj 100 MG/10ML: INTRAMUSCULAR | Qty: 1 | Status: AC

## 2021-04-16 ENCOUNTER — Other Ambulatory Visit: Payer: Self-pay

## 2021-04-16 ENCOUNTER — Inpatient Hospital Stay: Payer: Medicaid Other

## 2021-04-16 ENCOUNTER — Inpatient Hospital Stay: Payer: Medicaid Other | Attending: Hematology | Admitting: Hematology

## 2021-04-16 VITALS — BP 104/71 | HR 89 | Temp 98.4°F | Resp 17 | Ht 77.0 in | Wt 165.1 lb

## 2021-04-16 DIAGNOSIS — C787 Secondary malignant neoplasm of liver and intrahepatic bile duct: Secondary | ICD-10-CM | POA: Insufficient documentation

## 2021-04-16 DIAGNOSIS — C20 Malignant neoplasm of rectum: Secondary | ICD-10-CM | POA: Diagnosis present

## 2021-04-16 DIAGNOSIS — C775 Secondary and unspecified malignant neoplasm of intrapelvic lymph nodes: Secondary | ICD-10-CM | POA: Diagnosis not present

## 2021-04-16 DIAGNOSIS — Z5111 Encounter for antineoplastic chemotherapy: Secondary | ICD-10-CM

## 2021-04-16 LAB — CBC WITH DIFFERENTIAL/PLATELET
Abs Immature Granulocytes: 0.02 10*3/uL (ref 0.00–0.07)
Basophils Absolute: 0.1 10*3/uL (ref 0.0–0.1)
Basophils Relative: 1 %
Eosinophils Absolute: 0.2 10*3/uL (ref 0.0–0.5)
Eosinophils Relative: 3 %
HCT: 37.3 % — ABNORMAL LOW (ref 39.0–52.0)
Hemoglobin: 13 g/dL (ref 13.0–17.0)
Immature Granulocytes: 0 %
Lymphocytes Relative: 44 %
Lymphs Abs: 3.2 10*3/uL (ref 0.7–4.0)
MCH: 33 pg (ref 26.0–34.0)
MCHC: 34.9 g/dL (ref 30.0–36.0)
MCV: 94.7 fL (ref 80.0–100.0)
Monocytes Absolute: 0.8 10*3/uL (ref 0.1–1.0)
Monocytes Relative: 11 %
Neutro Abs: 3.1 10*3/uL (ref 1.7–7.7)
Neutrophils Relative %: 41 %
Platelets: 174 10*3/uL (ref 150–400)
RBC: 3.94 MIL/uL — ABNORMAL LOW (ref 4.22–5.81)
RDW: 22.6 % — ABNORMAL HIGH (ref 11.5–15.5)
WBC: 7.4 10*3/uL (ref 4.0–10.5)
nRBC: 0 % (ref 0.0–0.2)

## 2021-04-16 LAB — CMP (CANCER CENTER ONLY)
ALT: 16 U/L (ref 0–44)
AST: 19 U/L (ref 15–41)
Albumin: 3.9 g/dL (ref 3.5–5.0)
Alkaline Phosphatase: 102 U/L (ref 38–126)
Anion gap: 8 (ref 5–15)
BUN: 9 mg/dL (ref 6–20)
CO2: 21 mmol/L — ABNORMAL LOW (ref 22–32)
Calcium: 9.5 mg/dL (ref 8.9–10.3)
Chloride: 112 mmol/L — ABNORMAL HIGH (ref 98–111)
Creatinine: 0.73 mg/dL (ref 0.61–1.24)
GFR, Estimated: >60 mL/min (ref >60)
Glucose, Bld: 130 mg/dL — ABNORMAL HIGH (ref 70–99)
Potassium: 3.5 mmol/L (ref 3.5–5.1)
Sodium: 141 mmol/L (ref 135–145)
Total Bilirubin: 1.7 mg/dL — ABNORMAL HIGH (ref 0.3–1.2)
Total Protein: 7.2 g/dL (ref 6.5–8.1)

## 2021-04-16 LAB — CEA (IN HOUSE-CHCC): CEA (CHCC-In House): 3.96 ng/mL (ref 0.00–5.00)

## 2021-04-16 LAB — IRON AND TIBC
Iron: 79 ug/dL (ref 42–163)
Saturation Ratios: 19 % — ABNORMAL LOW (ref 20–55)
TIBC: 405 ug/dL (ref 202–409)
UIBC: 326 ug/dL (ref 117–376)

## 2021-04-16 LAB — VITAMIN B12: Vitamin B-12: 308 pg/mL (ref 180–914)

## 2021-04-16 LAB — FERRITIN: Ferritin: 104 ng/mL (ref 24–336)

## 2021-04-16 NOTE — Progress Notes (Signed)
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[place])  12/17/2020 Initial Diagnosis   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[place])   01/13/2021 Cancer Staging   Staging form: Colon and Rectum, AJCC 8th Edition - Clinical stage from 01/13/2021: Stage IIIB (cT3, cN2a, cM0) - Signed by [name], MD on 01/19/2021 Stage prefix: Initial diagnosis Histologic grade (G): G2 Histologic grading system: 4 grade system    01/20/2021 -  Chemotherapy   Patient is on Treatment Plan : RECTAL Xelox ([place]apeox) q21d x 6 cycles        CURRENT THERAPY:  Neoadjuvant chemotherapy with Xeloda/Oxaliplatin starting 01/20/21  INTERVAL HISTORY:   James Coffey is here for a follow up of rectal cancer and interview was held with the use of his daughter [name] who is a Presenter, broadcasting as an Astronomer. He is here for cycle 5 of oxaliplatin.    He notes no acute toxicities with his 4th cycle of [place] ox.  No diarrhea.  Minimal grade 1 nausea .no vomiting.  Minimal grade 1 cold induced neuropathy which is otherwise resolved.  No persistent neuropathy at this time No fevers no chills.  No other infection issues. Perineal wounds have nearly completely healed up. Sleeping better with the Ativan but notes on several nights he has to get up multiple times to pee. Has a CT chest abdomen pelvis scheduled on 04/21/2021. We discussed the broad plan to consider  chemoradiation assuming there is no metastatic disease found on his CT chest abdomen pelvis.  We discussed referring him to radiation oncology to try to speed up the process and reduce delays in his treatment even pending his CT scans.  Patient notes no significant rectal pain. No overt anxiety or depression.   Notes that he is continuing to cut down on his smoking and is down to few cigarettes a day.  MEDICAL HISTORY:  Past Medical History:  Diagnosis Date   Cancer ([place])    Tobacco abuse     SURGICAL HISTORY: Past Surgical History:  Procedure Laterality Date   HERNIA REPAIR     INCISION AND DRAINAGE ABSCESS N/A 12/01/2020   Procedure: INCISION AND DRAINAGE ABSCESS;  Surgeon: Michael Boston, MD;  Location: [facility];  Service: General;  Laterality: N/A;   INCISION AND DRAINAGE ABSCESS N/A 12/04/2020   Procedure: INCISION AND DRAINAGE FOURNIERS GANGRENE;  Surgeon: [name], MD;  Location: [facility];  Service: General;  Laterality: N/A;   INCISION AND DRAINAGE PERIRECTAL ABSCESS N/A 11/30/2020   Procedure: INCISION AND DEBRIDEMENT OF PERIRECTAL ABSCESS AND RECTAL WALL BIOPSIES;  Surgeon: Michael Boston, MD;  Location: [facility];  Service: General;  Laterality: N/A;   INGUINAL HERNIA REPAIR Right 2007   LAPAROSCOPIC LOOP COLOSTOMY N/A 12/13/2020   Procedure: DIAGNOSTIC LAPAROSCOPY WITH  OPEN DESCENDING LOOP COLOSTOMY;  Surgeon: Armandina Gemma, MD;  Location: [facility];  Service: General;  Laterality: N/A;   RECTAL SURGERY      I have reviewed the social history and family history with the patient and they  are unchanged from previous note.  ALLERGIES:  has No Known Allergies.  ROS .10 Point review of Systems was done is negative except as noted above.  MEDICATIONS:  Current Outpatient Medications  Medication Sig Dispense Refill   acetaminophen (TYLENOL) 500 MG tablet Take 2 tablets (1,000 mg total) by mouth every 8 (eight) hours as needed for mild pain or fever.     bacitracin ointment  Apply topically daily. Apply to lateral flank wounds 120 g 0   dexamethasone (DECADRON) 4 MG tablet Take 2 tablets by mouth daily. Start the day after IV chemotherapy for 2 days. Take with food. 30 tablet 1   ferrous sulfate 325 (65 FE) MG tablet Take 1 tablet (325 mg total) by mouth daily with breakfast. 30 tablet 0   ibuprofen (ADVIL) 600 MG tablet Take 1 tablet (600 mg total) by mouth 3 (three) times daily. 30 tablet 0   LORazepam (ATIVAN) 0.5 MG tablet Take 1 tablet by mouth every 8 hours as needed for sleep or anxiety. 60 tablet 0   methocarbamol (ROBAXIN) 500 MG tablet Take 1 tablet (500 mg total) by mouth every 8 (eight) hours as needed for muscle spasms. 30 tablet 0   ondansetron (ZOFRAN) 8 MG tablet Take 1 tablet by mouth 2 times daily as needed for refractory nausea/vomiting. Start on day 3 after chemotherapy. 30 tablet 1   potassium chloride SA (KLOR-CON) 20 MEQ tablet Take 1 tablet (20 mEq total) by mouth daily for 14 days. 14 tablet 0   prochlorperazine (COMPAZINE) 10 MG tablet Take 1 tablet by mouth every 6  hours as needed for nausea or vomiting. 30 tablet 1   traMADol (ULTRAM) 50 MG tablet Take 1 tablet by mouth every 8 hours as needed. 30 tablet 0   No current facility-administered medications for this visit.   Facility-Administered Medications Ordered in Other Visits  Medication Dose Route Frequency Provider Last Rate Last Admin   palonosetron (ALOXI) 0.25 MG/5ML injection            sodium chloride flush (NS) 0.9 % injection 10 mL  10 mL Intracatheter Once [name], MD        PHYSICAL EXAMINATION: ECOG PERFORMANCE STATUS: 1 - Symptomatic but completely ambulatory  Vitals:   04/16/21 0921  BP: 104/71  Pulse: 89  Resp: 17  Temp: 98.4 F (36.9 C)  SpO2: 98%   Wt Readings from Last 3 Encounters:  04/16/21 165 lb 1.6 oz (74.9 kg)  03/26/21 168 lb 14.4 oz (76.6 kg)  03/05/21 165 lb 12.8 oz (75.2 kg)  . GENERAL:alert, in no acute distress and  comfortable SKIN: no acute rashes, no significant lesions EYES: conjunctiva are pink and non-injected, sclera anicteric OROPHARYNX: MMM, no exudates, no oropharyngeal erythema or ulceration NECK: supple, no JVD LYMPH:  no palpable lymphadenopathy in the cervical, axillary or inguinal regions LUNGS: clear to auscultation b/l with normal respiratory effort HEART: regular rate & rhythm ABDOMEN:  normoactive bowel sounds , non tender, not distended.  Colostomy in situ.  Minimal less than 1 cm healing perineal wound. Extremity: no pedal edema PSYCH: alert & oriented x 3 with fluent speech NEURO: no focal motor/sensory deficits      LABORATORY DATA:  I have reviewed the data as listed CBC Latest Ref Rng & Units 04/16/2021 03/26/2021 03/05/2021  WBC 4.0 - 10.5 K/uL 7.4 8.0 9.4  Hemoglobin 13.0 - 17.0 g/dL 13.0 12.9(L) 13.3  Hematocrit 39.0 - 52.0 % 37.3(L) 37.2(L) 38.7(L)  Platelets 150 -  400 K/uL 174 166 306     CMP Latest Ref Rng & Units 04/16/2021 03/26/2021 03/05/2021  Glucose 70 - 99 mg/dL 130(H) 148(H) 133(H)  BUN 6 - 20 mg/dL '9 12 11  ' Creatinine 0.61 - 1.24 mg/dL 0.73 0.72 0.74  Sodium 135 - 145 mmol/L 141 140 140  Potassium 3.5 - 5.1 mmol/L 3.5 3.8 4.0  Chloride 98 - 111 mmol/L 112(H) 112(H) 109  CO2 22 - 32 mmol/L 21(L) 19(L) 21(L)  Calcium 8.9 - 10.3 mg/dL 9.5 9.4 9.9  Total Protein 6.5 - 8.1 g/dL 7.2 7.0 7.3  Total Bilirubin 0.3 - 1.2 mg/dL 1.7(H) 1.4(H) 0.6  Alkaline Phos 38 - 126 U/L 102 93 98  AST 15 - 41 U/L 19 13(L) 13(L)  ALT 0 - 44 U/L '16 12 14   ' . Lab Results  Component Value Date   IRON 79 04/16/2021   TIBC 405 04/16/2021   IRONPCTSAT 19 (L) 04/16/2021   (Iron and TIBC)  Lab Results  Component Value Date   FERRITIN 104 04/16/2021      RADIOGRAPHIC STUDIES: I have personally reviewed the radiological images as listed and agreed with the findings in the report. No results found.   ASSESSMENT & PLAN:  Deuntae Kocsis is a 50 y.o. male with   1.  Locally Advanced Rectal Adenocarcinoma, c(T3c, N2), MSI pending  -presented to ED on 11/29/20 with septic shock secondary to Fournier's gangrene -CT AP showed findings suspicious for necrotizing perineal soft tissue infection with irregular presacral nodule. Rectal biopsies obtained on 11/30/20 confirmed rectal adenocarcinoma. -Baseline CEA on 12/09/20 was elevated at 16.7. -He underwent diagnostic laparoscopy and open descending loop colostomy on 12/13/20 with Dr. Harlow Asa. -Chest CT on 12/14/20 was negative for metastatic disease. -Repeat CEA on 01/06/21 showed further elevation to 52.55. -staging pelvis MRI on 01/12/21 showed: rectal adenocarcinoma T stage: At least T3c, possible involvement of prostatic apex; N stage: N2; long segment rectal tumor, involving anal sphincters. -Given locally advanced disease,he was recommended neoadjuvant Xeloda/Oxaliplatin (CAPOX)  2.  Iron deficiency anemia now resolved iron levels look good  2. Rectal pain- nearly resolved -tramadol prn 3 insomnia -lorazepam prn  PLAN -Labs done today reviewed with the patient.  Stable. CEA levels are pending -He will follow-up on his CT chest abdomen pelvis scheduled for 04/21/2021 -We shall refer him to radiation oncology pending CT scan to help avoid delays in treatment. -Resuming he does not have any evidence of metastatic disease on his CTs would recommend proceeding to concurrent chemoradiation with Xeloda. -No other primary toxicities from proceeding with his cycle 5 of oxaliplatin today. -Monitor for healing of his perineal wound -Continued to emphasize on smoking cessation  Follow-up -Referral to radiation oncology for consideration of concurrent chemoradiation for his rectal cancer after neoadjuvant chemotherapy -Follow-up for CT chest abdomen pelvis as scheduled on 04/21/2021 -Return to clinic with Dr. Irene Limbo with labs in 2 weeks  . The total time spent in the appointment was 30 minutes and more than 50% was on  counseling and direct patient cares.    Sullivan Lone MD MS Hematology/Oncology Physician [facility].

## 2021-04-21 ENCOUNTER — Other Ambulatory Visit: Payer: Self-pay

## 2021-04-21 ENCOUNTER — Encounter (HOSPITAL_COMMUNITY): Payer: Self-pay

## 2021-04-21 ENCOUNTER — Ambulatory Visit (HOSPITAL_COMMUNITY)
Admission: RE | Admit: 2021-04-21 | Discharge: 2021-04-21 | Disposition: A | Discharge status: Home / Self Care | Payer: Medicaid Other | Source: Ambulatory Visit | Attending: Hematology | Admitting: Hematology

## 2021-04-21 DIAGNOSIS — C20 Malignant neoplasm of rectum: Secondary | ICD-10-CM | POA: Insufficient documentation

## 2021-04-21 DIAGNOSIS — C775 Secondary and unspecified malignant neoplasm of intrapelvic lymph nodes: Secondary | ICD-10-CM | POA: Insufficient documentation

## 2021-04-21 MED ORDER — IOHEXOL 350 MG/ML SOLN
75.0000 mL | Freq: Once | INTRAVENOUS | Status: AC | PRN
  Administered 2021-04-21: 75 mL via INTRAVENOUS

## 2021-04-21 NOTE — Progress Notes (Signed)
GI Location of Tumor / Histology: Rectal Cancer with mets to intrapelvic lymph node.  James Coffey reported intermittent lower abdominal pain for about 3 months.  He presented to the ED on 11/29/2020 with septic shock secondary to Fournier's gangrene.   CT CAP 04/21/2021: No evidence of thoracic metastasis.  Unfortunately there is a new hypodense lesion in the RIGHT hepatic lobe which is highly concerning for COLORECTAL CARCINOMA METASTASIS.  Smaller hypodense lesion in the LEFT lateral hepatic lobe is less specific but also concerning.  Interval loop colostomy without complication.  Soft tissue nodule in the presacral space is decreased in size.  Persistent small presacral node.  MRI Pelvis 01/12/2021: Rectal Adenocarcinoma T stage: at least T3c, possible involvement of prostatic apex; N stage: N2; long segment rectal tumor, involving anal sphincters.  CT AP 11/30/2020: Worsening perineal inflammation, including right peri-rectal. Significant progression of soft tissue gas, now extending anteriorly into the perineum, base of the penis, both scrotal sacs and anterior abdominal wall. Findings are highly suspicious for necrotizing soft tissue infection, and urgent surgical consult is recommended.  Irregular presacral nodule measuring 2.2 cm may represent an enlarged lymph node but is nonspecific. Recommend follow-up after course of treatment. There is rectal wall thickening which may be inflammatory, however neoplasm is not excluded.    CT AP 11/03/2020: Perirectal/perianal infection reflected by soft tissue inflammation and soft tissue air, along with thickening of the inferior rectal wall. Mild inflammatory change extends just above the levator sling adjacent to the inferior rectum. There is no fluid collection to suggest an abscess, however. There are prominent right inguinal lymph nodes, presumed reactive to the perineal inflammation.  Biopsies of Rectal Wall Mass 11/30/2020    Past/Anticipated  interventions by surgeon, if any:  Dr. Harlow Asa 12/13/2020 -Diagnostic laparoscopy with open descending loop colostomy -Incision and debridement of perirectal abscess and rectal wall biopsies 11/30/2020  Past/Anticipated interventions by medical oncology, if any:  Dr. Irene Limbo 04/24/2021   04/16/2021 -He will follow-up on his CT chest abdomen pelvis scheduled for 04/21/2021 -We shall refer him to radiation oncology pending CT scan to help avoid delays in treatment. -Assuming he does not have any evidence of metastatic disease on his CTs would recommend proceeding to concurrent chemoradiation with Xeloda.   Weight changes, if any: Stable  Bowel/Bladder complaints, if any: Has colostomy, denies urinary changes.  Nausea / Vomiting, if any: No  Pain issues, if any: None   Any blood per rectum: No    SAFETY ISSUES: Prior radiation? No Pacemaker/ICD? No Possible current pregnancy? N/a Is the patient on methotrexate? No  Current Complaints/Details:

## 2021-04-22 ENCOUNTER — Ambulatory Visit
Admission: RE | Admit: 2021-04-22 | Discharge: 2021-04-22 | Disposition: A | Discharge status: Home / Self Care | Payer: Medicaid Other | Source: Ambulatory Visit | Attending: Radiation Oncology | Admitting: Radiation Oncology

## 2021-04-22 ENCOUNTER — Encounter: Payer: Self-pay | Admitting: Radiation Oncology

## 2021-04-22 VITALS — BP 122/86 | HR 100 | Temp 97.2°F | Resp 18 | Wt 167.4 lb

## 2021-04-22 DIAGNOSIS — C20 Malignant neoplasm of rectum: Secondary | ICD-10-CM | POA: Diagnosis present

## 2021-04-22 DIAGNOSIS — C775 Secondary and unspecified malignant neoplasm of intrapelvic lymph nodes: Secondary | ICD-10-CM | POA: Diagnosis not present

## 2021-04-22 DIAGNOSIS — Z79899 Other long term (current) drug therapy: Secondary | ICD-10-CM | POA: Diagnosis not present

## 2021-04-22 DIAGNOSIS — F17210 Nicotine dependence, cigarettes, uncomplicated: Secondary | ICD-10-CM | POA: Diagnosis not present

## 2021-04-22 DIAGNOSIS — R55 Syncope and collapse: Secondary | ICD-10-CM | POA: Diagnosis not present

## 2021-04-22 NOTE — Progress Notes (Signed)
Radiation Oncology         [phone] ________________________________  Name: James Coffey        MRN: [id number]  Date of Service: 04/22/2021 DOB: [birth year]  CW:CBJS, Cloria Spring, MD  [name], MD     REFERRING PHYSICIAN: [name], MD   DIAGNOSIS: The primary encounter diagnosis was Rectal cancer [facility]). 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   HISTORY OF PRESENT ILLNESS: James Coffey is a 50 y.o. male seen at the request of Dr. Irene Limbo for a diagnosis of rectal carcinoma.  The patient also met Dr. Burr Medico at a time when Dr. Irene Limbo had been out of the office.  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  He also had a presacral lymph node that measured 2.2 cm, and was taken to the OR for incision and drainage on 11/30/2020.  At that time a rectal stricture were noted in the left lateral and right lateral rectal wall biopsy both confirmed adenocarcinoma arising from the GI tract at the level of the rectum.  CEA at that time of his hospitalization was 16.7 diagnostic laparoscopy on 12/13/2020 was performed so he could undergo descending loop colostomy.  No evidence of disease in the chest was seen by CT imaging he was counseled on the fact that clinically he was felt to be stage III by Dr. Irene Limbo and he underwent an MRI of the pelvis on 01/12/2021 showing at least T3c disease with possible erosion of the prostate. His staging for nodal disease was N2 by MRI, and his tumor was noted to be involving the anal sphincter.  He was counseled on total neoadjuvant chemotherapy  which she began on 01/20/2021 with CAPOX.  His last treatment was cycle 4 on 03/26/2021, and he is scheduled for #5 on Thursday of this week.  He did have repeat restaging scans performed yesterday which showed a new lesion in the right hepatic lobe concerning for carcinoma, and a smaller hypodense lesion in the left lateral hepatic lobe, less specific but also concerning.  He is seen today to discuss chemoradiation at the conclusion of cycle 6 of chemo.     PREVIOUS RADIATION THERAPY: No   PAST MEDICAL HISTORY:  Past Medical History:  Diagnosis Date   Cancer ([place])    Tobacco abuse        PAST SURGICAL HISTORY: Past Surgical History:  Procedure Laterality Date   HERNIA REPAIR     INCISION AND DRAINAGE ABSCESS N/A 12/01/2020   Procedure: INCISION AND DRAINAGE ABSCESS;  Surgeon: Michael Boston, MD;  Location: [facility];  Service: General;  Laterality: N/A;   INCISION AND DRAINAGE ABSCESS N/A 12/04/2020   Procedure: INCISION AND DRAINAGE FOURNIERS GANGRENE;  Surgeon: [name], MD;  Location: [facility];  Service: General;  Laterality: N/A;   INCISION AND DRAINAGE PERIRECTAL ABSCESS N/A 11/30/2020   Procedure: INCISION AND DEBRIDEMENT OF PERIRECTAL ABSCESS AND RECTAL WALL BIOPSIES;  Surgeon: Michael Boston, MD;  Location: [facility];  Service: General;  Laterality: N/A;   INGUINAL HERNIA REPAIR Right 2007   LAPAROSCOPIC LOOP COLOSTOMY N/A 12/13/2020   Procedure: DIAGNOSTIC LAPAROSCOPY  WITH  OPEN DESCENDING LOOP COLOSTOMY;  Surgeon: Armandina Gemma, MD;  Location: [facility];  Service: General;  Laterality: N/A;   RECTAL SURGERY       FAMILY HISTORY:  Family History  Problem Relation Age of Onset   CAD Neg Hx    Inflammatory bowel disease Neg Hx      SOCIAL HISTORY:  reports that he has been smoking cigarettes. He has been smoking an average of 1 pack per day. He has never used smokeless tobacco. He reports that he does not drink alcohol and does not use drugs. The patient is married and lives  in [place]. His daughter [name] is his designated Hospital doctor. He is originally from [place]. He drives trucks for a living.   ALLERGIES: Patient has no known allergies.   MEDICATIONS:  Current Outpatient Medications  Medication Sig Dispense Refill   acetaminophen (TYLENOL) 500 MG tablet Take 2 tablets (1,000 mg total) by mouth every 8 (eight) hours as needed for mild pain or fever.     bacitracin ointment Apply topically daily. Apply to lateral flank wounds 120 g 0   dexamethasone (DECADRON) 4 MG tablet Take 2 tablets by mouth daily. Start the day after IV chemotherapy for 2 days. Take with food. 30 tablet 1   ibuprofen (ADVIL) 600 MG tablet Take 1 tablet (600 mg total) by mouth 3 (three) times daily. 30 tablet 0   LORazepam (ATIVAN) 0.5 MG tablet Take 1 tablet by mouth every 8 hours as needed for sleep or anxiety. 60 tablet 0   methocarbamol (ROBAXIN) 500 MG tablet Take 1 tablet (500 mg total) by mouth every 8 (eight) hours as needed for muscle spasms. 30 tablet 0   ondansetron (ZOFRAN) 8 MG tablet Take 1 tablet by mouth 2 times daily as needed for refractory nausea/vomiting. Start on day 3 after chemotherapy. 30 tablet 1   prochlorperazine (COMPAZINE) 10 MG tablet Take 1 tablet by mouth every 6  hours as needed for nausea or vomiting. 30 tablet 1   traMADol (ULTRAM) 50 MG tablet Take 1 tablet by mouth every 8 hours as needed. 30 tablet 0   ferrous sulfate 325 (65 FE) MG tablet Take 1 tablet (325 mg total) by mouth daily with breakfast. 30 tablet 0   potassium chloride SA (KLOR-CON) 20 MEQ tablet Take 1 tablet (20 mEq total) by mouth daily for 14 days. 14 tablet 0   No current facility-administered medications for this encounter.   Facility-Administered Medications Ordered in Other Encounters  Medication Dose Route Frequency Provider Last Rate Last Admin   palonosetron (ALOXI) 0.25 MG/5ML injection            sodium chloride flush (NS) 0.9 % injection 10 mL  10 mL  Intracatheter Once [name], MD         REVIEW OF SYSTEMS: On review of systems, the patient reports that he is not having any rectal bleeding or pain. He reports he is now sitting comfortably. He has two small open wounds in the perineum and suprapubic region that he still packs with gauze. He reports normal bowel consistency through his ostomy and denies any urinary changes.  No other complaints are noted.      PHYSICAL EXAM:  Wt Readings from Last 3 Encounters:  04/22/21 167 lb 6 oz (75.9 kg)  04/16/21 165 lb 1.6 oz (74.9 kg)  03/26/21 168 lb 14.4 oz (76.6 kg)   Temp Readings from Last 3 Encounters:  04/22/21 (!) 97.2 F (  36.2 C) (Temporal)  04/16/21 98.4 F (36.9 C) (Temporal)  03/26/21 97.8 F (36.6 C) (Oral)   BP Readings from Last 3 Encounters:  04/22/21 122/86  04/16/21 104/71  03/26/21 94/69   Pulse Readings from Last 3 Encounters:  04/22/21 100  04/16/21 89  03/26/21 72   Pain Assessment Pain Score: 0-No pain/10  In general this is a well appearing [place] european male in no acute distress. He's alert and oriented x4 and appropriate throughout the examination. Cardiopulmonary assessment is negative for acute distress and he exhibits normal effort.     ECOG = 1  0 - Asymptomatic (Fully active, able to carry on all predisease activities without restriction)  1 - Symptomatic but completely ambulatory (Restricted in physically strenuous activity but ambulatory and able to carry out work of a light or sedentary nature. For example, light housework, office work)  2 - Symptomatic, <50% in bed during the day (Ambulatory and capable of all self care but unable to carry out any work activities. Up and about more than 50% of waking hours)  3 - Symptomatic, >50% in bed, but not bedbound (Capable of only limited self-care, confined to bed or chair 50% or more of waking hours)  4 - Bedbound (Completely disabled. Cannot carry on any self-care. Totally confined  to bed or chair)  5 - Death   Eustace Pen MM, Creech RH, Tormey DC, et al. [id number]). "Toxicity and response criteria of the [facility]". [place] Oncol. 5 (6): 649-55    LABORATORY DATA:  Lab Results  Component Value Date   WBC 7.4 04/16/2021   HGB 13.0 04/16/2021   HCT 37.3 (L) 04/16/2021   MCV 94.7 04/16/2021   PLT 174 04/16/2021   Lab Results  Component Value Date   NA 141 04/16/2021   K 3.5 04/16/2021   CL 112 (H) 04/16/2021   CO2 21 (L) 04/16/2021   Lab Results  Component Value Date   ALT 16 04/16/2021   AST 19 04/16/2021   ALKPHOS 102 04/16/2021   BILITOT 1.7 (H) 04/16/2021      RADIOGRAPHY: CT CHEST ABDOMEN PELVIS W CONTRAST  Result Date: 04/21/2021 CLINICAL DATA:  Colorectal cancer. Locally advanced colorectal cancer. Chemotherapy complete. EXAM: CT CHEST, ABDOMEN, AND PELVIS WITH CONTRAST TECHNIQUE: Multidetector CT imaging of the chest, abdomen and pelvis was performed following the standard protocol during bolus administration of intravenous contrast. CONTRAST:  56m OMNIPAQUE IOHEXOL 350 MG/ML SOLN COMPARISON:  CT 11/30/2020 FINDINGS: CT CHEST FINDINGS Cardiovascular: No significant vascular findings. Normal heart size. No pericardial effusion. Mediastinum/Nodes: No axillary or supraclavicular adenopathy. No mediastinal or hilar adenopathy. No pericardial fluid. Esophagus normal. Lungs/Pleura: No suspicious pulmonary nodules. Normal pleural. Airways normal. Musculoskeletal: No aggressive osseous lesion. CT ABDOMEN AND PELVIS FINDINGS Hepatobiliary: New round 14 mm hypodense lesion in the RIGHT hepatic lobe (image 60/series 2). Tiny hypodense lesion in the LEFT lateral hepatic lobe measures 4 mm (image 53/2). Pancreas: Pancreas is normal. No ductal dilatation. No pancreatic inflammation. Spleen: Normal spleen Adrenals/urinary tract: Adrenal glands and kidneys are normal. The ureters and bladder normal. Thin dependent calcification in the bladder  (image 121/2 Stomach/Bowel: Stomach, duodenum and small-bowel are normal. Normal cecum. Descending colon exits a LEFT abdominal wall colostomy. Loop colostomy. The distal LEFT colon is collapsed in the sigmoid colon up to the rectum. Reduction in rectal wall thickening. There is a persistent lymph node in the presacral space measuring 18 mm decreased from 25 mm remeasured. Small presacral lymph node  measuring 9 mm image 111/12 is similar. Vascular/Lymphatic: Abdominal aorta is normal caliber. There is no retroperitoneal or periportal lymphadenopathy. See GI section for description of presacral adenopathy Reproductive: Prostate unremarkable. Resolution of peritoneal infection at the base of the penis. Other: No peritoneal metastasis identified. Musculoskeletal: No aggressive osseous lesion. IMPRESSION: Chest Impression: 1. No evidence of thoracic metastasis. Abdomen / Pelvis Impression: 1. Unfortunately there is a new hypodense lesion in the RIGHT hepatic lobe which is highly concerning for COLORECTAL CARCINOMA METASTASIS. 2. Smaller hypodense lesion in the LEFT lateral hepatic lobe is less specific but also concerning. 3. Interval loop colostomy without complication. 4. Soft tissue nodule in the presacral space is decreased in size. Persistent small presacral node. 5. Interval RESOLUTION of aggressive infection in the perineum seen on comparison CT. These results will be called to the ordering clinician or representative by the Radiologist Assistant, and communication documented in the PACS or [employer]. Electronically Signed   By: Suzy Bouchard M.D.   On: 04/21/2021 11:56       IMPRESSION/PLAN: 1. Progressive metastatic adenocarcinoma of the rectum with newly noted liver disease. Dr. Lisbeth Renshaw discusses the pathology findings and reviews the nature of rectal disease. To determine if he has metastatic disease, Dr. Lisbeth Renshaw recommends an MRI of the abdomen to assess the liver. If he does not have metastatic  disease, Dr. Lisbeth Renshaw would offer chemoRT. We discussed the risks, benefits, short, and long term effects of radiotherapy, as well as the curative intent, and the patient is interested in proceeding. Dr. Lisbeth Renshaw discusses the delivery and logistics of radiotherapy and anticipates a course of 5 1/2 weeks of radiotherapy. Written consent is obtained and placed in the chart, a copy was provided to the patient. He will be contacted by our simulation staff to coordinate treatment planning once we know the status of his liver.  In a visit lasting 60 minutes, greater than 50% of the time was spent face to face discussing the patient's condition, in preparation for the discussion, and coordinating the patient's care.   The above documentation reflects my direct findings during this shared patient visit. Please see the separate note by Dr. Lisbeth Renshaw on this date for the remainder of the patient's plan of care.    Carola Rhine, [facility]   **Disclaimer: This note was dictated with voice recognition software. Similar sounding words can inadvertently be transcribed and this note may contain transcription errors which may not have been corrected upon publication of note.**

## 2021-04-24 ENCOUNTER — Inpatient Hospital Stay: Payer: Medicaid Other

## 2021-04-24 ENCOUNTER — Other Ambulatory Visit: Payer: Self-pay

## 2021-04-24 ENCOUNTER — Inpatient Hospital Stay (HOSPITAL_BASED_OUTPATIENT_CLINIC_OR_DEPARTMENT_OTHER): Payer: Medicaid Other | Admitting: Hematology

## 2021-04-24 VITALS — BP 105/68 | HR 88 | Temp 98.1°F | Resp 20 | Wt 167.4 lb

## 2021-04-24 DIAGNOSIS — Z7189 Other specified counseling: Secondary | ICD-10-CM

## 2021-04-24 DIAGNOSIS — C787 Secondary malignant neoplasm of liver and intrahepatic bile duct: Secondary | ICD-10-CM | POA: Diagnosis not present

## 2021-04-24 DIAGNOSIS — C20 Malignant neoplasm of rectum: Secondary | ICD-10-CM

## 2021-04-24 DIAGNOSIS — C775 Secondary and unspecified malignant neoplasm of intrapelvic lymph nodes: Secondary | ICD-10-CM | POA: Diagnosis not present

## 2021-04-25 ENCOUNTER — Telehealth: Payer: Self-pay | Admitting: *Deleted

## 2021-04-25 NOTE — Telephone Encounter (Signed)
CALLED PATIENT TO INFORM OF MRI FOR 05-02-21- ARRIVAL TIME- 7:30 AM @ [facility], PATIENT TO BE NPO- @ MIDNIGHT, LVM FOR A RETURN CALL

## 2021-04-30 ENCOUNTER — Other Ambulatory Visit: Payer: Self-pay | Admitting: Hematology

## 2021-04-30 DIAGNOSIS — C20 Malignant neoplasm of rectum: Secondary | ICD-10-CM

## 2021-04-30 DIAGNOSIS — C775 Secondary and unspecified malignant neoplasm of intrapelvic lymph nodes: Secondary | ICD-10-CM

## 2021-05-01 ENCOUNTER — Encounter: Payer: Self-pay | Admitting: Hematology

## 2021-05-02 ENCOUNTER — Ambulatory Visit (HOSPITAL_COMMUNITY)
Admission: RE | Admit: 2021-05-02 | Discharge: 2021-05-02 | Disposition: A | Discharge status: Home / Self Care | Payer: Medicaid Other | Source: Ambulatory Visit | Attending: Radiation Oncology | Admitting: Radiation Oncology

## 2021-05-02 ENCOUNTER — Other Ambulatory Visit: Payer: Self-pay

## 2021-05-02 ENCOUNTER — Telehealth: Payer: Self-pay | Admitting: Radiation Oncology

## 2021-05-02 DIAGNOSIS — C20 Malignant neoplasm of rectum: Secondary | ICD-10-CM | POA: Diagnosis present

## 2021-05-02 MED ORDER — GADOBUTROL 1 MMOL/ML IV SOLN
8.0000 mL | Freq: Once | INTRAVENOUS | Status: AC | PRN
  Administered 2021-05-02: 8 mL via INTRAVENOUS

## 2021-05-06 ENCOUNTER — Encounter: Payer: Self-pay | Admitting: Hematology

## 2021-05-06 ENCOUNTER — Other Ambulatory Visit: Payer: Self-pay | Admitting: Internal Medicine

## 2021-05-06 NOTE — Telephone Encounter (Signed)
I messaged Dr. Irene Limbo about the MRI findings. On 05/06/21 I called the patient with the help of [employer] interpret[employer] and we left a voicemail asking him to call us back to review his test results.

## 2021-05-06 NOTE — Progress Notes (Signed)
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[place])  12/17/2020 Initial Diagnosis   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[place])   01/13/2021 Cancer Staging   Staging form: Colon and Rectum, AJCC 8th Edition - Clinical stage from 01/13/2021: Stage IIIB (cT3, cN2a, cM0) - Signed by [name], MD on 01/19/2021 Stage prefix: Initial diagnosis Histologic grade (G): G2 Histologic grading system: 4 grade system    01/20/2021 - 03/26/2021 Chemotherapy   Patient is on Treatment Plan : RECTAL Xelox ([place]ox) q21d x 6 cycles        CURRENT THERAPY:  Neoadjuvant chemotherapy with Xeloda/Oxaliplatin starting 01/20/21  INTERVAL HISTORY:   James Coffey is here for a follow up of rectal cancer by his daughter [name] who is a Presenter, broadcasting and served as an Astronomer. He is here for cycle 5 of oxaliplatin.   Patient had a CT chest abdomen pelvis on 04/21/2021 which unfortunately shows new hypodense lesion in the right hepatic lobe that is concerning for metastases from his rectal adenocarcinoma .  There is also a smaller lesion in the left lateral hepatic lobe that is concerning.  Soft tissue nodule in the presacral space is decreased in size.  Interval resolution of aggressive infection in the perineum.  We discussed that since he has concern for liver metastases we cannot recommend proceeding with concurrent  chemoradiation to his primary rectal tumor at this time. We discussed that this is metastatic and though there is a potential for resection of his liver metastases we would need to change gears to more aggressive chemotherapy to ensure that his disease is well controlled prior to proceeding to surgery.  He has an MRI of the abdomen scheduled for radiation oncology. Radiation oncology is on board but given his findings on CT scan we will hold off on radiation at this time.  Patient was scheduled for cycle 5 of oxaliplatin but wanted to hold off at this time. We discussed proceeding to more aggressive chemotherapy with FOLFIRINOX and patient is agreeable to this.  His daughter is well-informed being a Marine scientist and asked detailed and appropriate questions. We discussed port placement and the patient is agreeable to this.  Patient notes no rectal or abdominal pain no other uncontrolled symptoms at this time. Notes that he is continuing to cut down on his smoking and is down to few cigarettes a day.  MEDICAL HISTORY:  Past Medical History:  Diagnosis Date   Cancer ([place])    Tobacco abuse     SURGICAL HISTORY: Past Surgical History:  Procedure Laterality Date   HERNIA REPAIR     INCISION AND DRAINAGE ABSCESS N/A 12/01/2020   Procedure: INCISION AND DRAINAGE ABSCESS;  Surgeon: Michael Boston, MD;  Location: [facility];  Service: General;  Laterality: N/A;   INCISION AND DRAINAGE ABSCESS N/A 12/04/2020   Procedure: INCISION AND DRAINAGE FOURNIERS GANGRENE;  Surgeon: [name], MD;  Location: [facility];  Service: General;  Laterality: N/A;   INCISION AND DRAINAGE PERIRECTAL ABSCESS N/A 11/30/2020  Procedure: INCISION AND DEBRIDEMENT OF PERIRECTAL ABSCESS AND RECTAL WALL BIOPSIES;  Surgeon: Michael Boston, MD;  Location: [facility];  Service: General;  Laterality: N/A;   INGUINAL HERNIA REPAIR Right 2007   LAPAROSCOPIC LOOP COLOSTOMY N/A 12/13/2020   Procedure: DIAGNOSTIC LAPAROSCOPY WITH  OPEN  DESCENDING LOOP COLOSTOMY;  Surgeon: Armandina Gemma, MD;  Location: [facility];  Service: General;  Laterality: N/A;   RECTAL SURGERY      I have reviewed the social history and family history with the patient and they are unchanged from previous note.  ALLERGIES:  has No Known Allergies.  ROS .10 Point review of Systems was done is negative except as noted above.  MEDICATIONS:  Current Outpatient Medications  Medication Sig Dispense Refill   acetaminophen (TYLENOL) 500 MG tablet Take 2 tablets (1,000 mg total) by mouth every 8 (eight) hours as needed for mild pain or fever.     bacitracin ointment Apply topically daily. Apply to lateral flank wounds 120 g 0   ferrous sulfate 325 (65 FE) MG tablet Take 1 tablet (325 mg total) by mouth daily with breakfast. 30 tablet 0   ibuprofen (ADVIL) 600 MG tablet Take 1 tablet (600 mg total) by mouth 3 (three) times daily. 30 tablet 0   methocarbamol (ROBAXIN) 500 MG tablet Take 1 tablet (500 mg total) by mouth every 8 (eight) hours as needed for muscle spasms. 30 tablet 0   potassium chloride SA (KLOR-CON) 20 MEQ tablet Take 1 tablet (20 mEq total) by mouth daily for 14 days. 14 tablet 0   traMADol (ULTRAM) 50 MG tablet Take 1 tablet by mouth every 8 hours as needed. 30 tablet 0   No current facility-administered medications for this visit.   Facility-Administered Medications Ordered in Other Visits  Medication Dose Route Frequency Provider Last Rate Last Admin   palonosetron (ALOXI) 0.25 MG/5ML injection            sodium chloride flush (NS) 0.9 % injection 10 mL  10 mL Intracatheter Once [name], MD        PHYSICAL EXAMINATION: ECOG PERFORMANCE STATUS: 1 - Symptomatic but completely ambulatory  Vitals:   04/24/21 1154  BP: 105/68  Pulse: 88  Resp: 20  Temp: 98.1 F (36.7 C)  SpO2: 100%   Wt Readings from Last 3 Encounters:  04/24/21 167 lb 6.4 oz (75.9 kg)  04/22/21 167 lb 6 oz (75.9 kg)  04/16/21 165 lb 1.6 oz (74.9 kg)   . GENERAL:alert, in no acute distress and comfortable SKIN: no acute rashes, no significant lesions EYES: conjunctiva are pink and non-injected, sclera anicteric OROPHARYNX: MMM, no exudates, no oropharyngeal erythema or ulceration NECK: supple, no JVD LYMPH:  no palpable lymphadenopathy in the cervical, axillary or inguinal regions LUNGS: clear to auscultation b/l with normal respiratory effort HEART: regular rate & rhythm ABDOMEN:  normoactive bowel sounds , non tender, not distended. Extremity: no pedal edema PSYCH: alert & oriented x 3 with fluent speech NEURO: no focal motor/sensory deficits   LABORATORY DATA:  I have reviewed the data as listed CBC Latest Ref Rng & Units 04/16/2021 03/26/2021 03/05/2021  WBC 4.0 - 10.5 K/uL 7.4 8.0 9.4  Hemoglobin 13.0 - 17.0 g/dL 13.0 12.9(L) 13.3  Hematocrit 39.0 - 52.0 % 37.3(L) 37.2(L) 38.7(L)  Platelets 150 - 400 K/uL 174 166 306     CMP Latest Ref Rng & Units 04/16/2021 03/26/2021 03/05/2021  Glucose 70 - 99 mg/dL 130(H) 148(H) 133(H)  BUN 6 - 20  mg/dL '9 12 11  ' Creatinine 0.61 - 1.24 mg/dL 0.73 0.72 0.74  Sodium 135 - 145 mmol/L 141 140 140  Potassium 3.5 - 5.1 mmol/L 3.5 3.8 4.0  Chloride 98 - 111 mmol/L 112(H) 112(H) 109  CO2 22 - 32 mmol/L 21(L) 19(L) 21(L)  Calcium 8.9 - 10.3 mg/dL 9.5 9.4 9.9  Total Protein 6.5 - 8.1 g/dL 7.2 7.0 7.3  Total Bilirubin 0.3 - 1.2 mg/dL 1.7(H) 1.4(H) 0.6  Alkaline Phos 38 - 126 U/L 102 93 98  AST 15 - 41 U/L 19 13(L) 13(L)  ALT 0 - 44 U/L '16 12 14   ' . Lab Results  Component Value Date   IRON 79 04/16/2021   TIBC 405 04/16/2021   IRONPCTSAT 19 (L) 04/16/2021   (Iron and TIBC)  Lab Results  Component Value Date   FERRITIN 104 04/16/2021     SURGICAL PATHOLOGY   THIS IS AN ADDENDUM REPORT   CASE: [id number]  PATIENT: [facility]  Surgical Pathology Report  Addendum    Reason for Addendum #1:  DNA Mismatch Repair IHC Results   Clinical History: Fournier's Gangrene; Cancer ?  ([facility])      FINAL MICROSCOPIC DIAGNOSIS:   A. IRRIGATION AND DEBRIDEMENT OF SCROTUM AND PENIS:  Acute necrotizing inflammation extensively involving fascia    B. Rectal wall biopsies:  Adenocarcinoma of the rectum      RADIOGRAPHIC STUDIES: I have personally reviewed the radiological images as listed and agreed with the findings in the report. No results found.   ASSESSMENT & PLAN:  James Coffey is a 50 y.o. male with   1.  Oligometastatic rectal cancer with 2 visible liver metastases-newly noted on CT chest abdomen pelvis.  2.  Initially diagnosed locally Advanced Rectal Adenocarcinoma, c(T3c, N2), mismatch repair protein normal -presented to ED on 11/29/20 with septic shock secondary to Fournier's gangrene -CT AP showed findings suspicious for necrotizing perineal soft tissue infection with irregular presacral nodule. Rectal biopsies obtained on 11/30/20 confirmed rectal adenocarcinoma. -Baseline CEA on 12/09/20 was elevated at 16.7. -He underwent diagnostic laparoscopy and open descending loop colostomy on 12/13/20 with Dr. Harlow Asa. -Chest CT on 12/14/20 was negative for metastatic disease. -Repeat CEA on 01/06/21 showed further elevation to 52.55. -staging pelvis MRI on 01/12/21 showed: rectal adenocarcinoma T stage: At least T3c, possible involvement of prostatic apex; N stage: N2; long segment rectal tumor, involving anal sphincters. -Given locally advanced disease,he was recommended neoadjuvant Xeloda/Oxaliplatin (CAPOX)  3.  Iron deficiency anemia now resolved . Lab Results  Component Value Date   IRON 79 04/16/2021   TIBC 405 04/16/2021   IRONPCTSAT 19 (L) 04/16/2021   (Iron and TIBC)  Lab Results  Component Value Date   FERRITIN 104 04/16/2021   4. Rectal pain- nearly resolved -tramadol prn 5insomnia -lorazepam prn  PLAN -Labs done 04/16/2021 reviewed with the patient.  His CEA levels have normalized. Patient had a CT chest abdomen pelvis on 04/21/2021 which  unfortunately shows new hypodense lesion in the right hepatic lobe that is concerning for metastases from his rectal adenocarcinoma .  There is also a smaller lesion in the left lateral hepatic lobe that is concerning.  Soft tissue nodule in the presacral space is decreased in size.  Interval resolution of aggressive infection in the perineum.  We discussed that since he has concern for liver metastases we cannot recommend proceeding with concurrent chemoradiation to his primary rectal tumor at this time. We discussed that this is metastatic and though there is  a potential for resection of his liver metastases we would need to change gears to more aggressive chemotherapy to ensure that his disease is well controlled prior to proceeding to surgery.  He has an MRI of the abdomen scheduled for radiation oncology. Radiation oncology is on board but given his findings on CT scan we will hold off on radiation at this time.  Patient was scheduled for cycle 5 of oxaliplatin but wanted to hold off at this time. We discussed proceeding to more aggressive chemotherapy with FOLFIRINOX and patient is agreeable to this.  His daughter is well-informed being a Marine scientist and asked detailed and appropriate questions. We discussed port placement and the patient is agreeable to this. -Continued to emphasize on smoking cessation  -We will plan to do 2 more months of FOLFIRINOX and reevaluate and imaging. -We shall have the surgeons involved again to get their opinion about resectability of his liver metastases and primary rectal cancer versus rectal tumor resection with ablative therapies to the liver mets if the overall disease is stable.  Follow-up -Referral to social work on getting him for consideration of medical transportation -Urgent referral for Port-A-Cath placement for FOLFIRINOX chemotherapy -US biopsy of liver biopsy in 1 week -Schedule to start FOLFIRINOX in 7-10 days -pathology ref for molecular studies on  his prior biopsy sample  . The total time spent in the appointment was 30 minutes and more than 50% was on counseling and direct patient cares.    Sullivan Lone MD MS Hematology/Oncology Physician [facility].

## 2021-05-06 NOTE — Telephone Encounter (Signed)
The patient's daughter called back and we were able to review the MRI scan, the plan to hold off on radiation unless he were symptomatic at this point. She is in agreement. He is going for his PAC placement this week and we will plan to revisit role of radiation if he needed palliative treatment.

## 2021-05-07 ENCOUNTER — Ambulatory Visit (HOSPITAL_COMMUNITY)
Admission: RE | Admit: 2021-05-07 | Discharge: 2021-05-07 | Disposition: A | Discharge status: Home / Self Care | Payer: Medicaid Other | Source: Ambulatory Visit | Attending: Hematology | Admitting: Hematology

## 2021-05-07 ENCOUNTER — Other Ambulatory Visit (HOSPITAL_COMMUNITY): Payer: Self-pay

## 2021-05-07 ENCOUNTER — Encounter: Payer: Self-pay | Admitting: Hematology

## 2021-05-07 ENCOUNTER — Other Ambulatory Visit: Payer: Self-pay

## 2021-05-07 ENCOUNTER — Encounter (HOSPITAL_COMMUNITY): Payer: Self-pay

## 2021-05-07 DIAGNOSIS — C787 Secondary malignant neoplasm of liver and intrahepatic bile duct: Secondary | ICD-10-CM | POA: Insufficient documentation

## 2021-05-07 DIAGNOSIS — C20 Malignant neoplasm of rectum: Secondary | ICD-10-CM | POA: Insufficient documentation

## 2021-05-07 DIAGNOSIS — C775 Secondary and unspecified malignant neoplasm of intrapelvic lymph nodes: Secondary | ICD-10-CM | POA: Diagnosis not present

## 2021-05-07 DIAGNOSIS — Z7189 Other specified counseling: Secondary | ICD-10-CM | POA: Insufficient documentation

## 2021-05-07 HISTORY — PX: IR IMAGING GUIDED PORT INSERTION: IMG5740

## 2021-05-07 MED ORDER — ONDANSETRON HCL 8 MG PO TABS
8.0000 mg | ORAL_TABLET | Freq: Two times a day (BID) | ORAL | 1 refills | Status: DC | PRN
  Filled 2021-05-07: qty 30, 15d supply, fill #0

## 2021-05-07 MED ORDER — LOPERAMIDE HCL 2 MG PO CAPS
ORAL_CAPSULE | ORAL | 1 refills | Status: DC
  Filled 2021-05-07: qty 30, 30d supply, fill #0

## 2021-05-07 MED ORDER — SODIUM CHLORIDE 0.9 % IV SOLN: INTRAVENOUS | Status: DC

## 2021-05-07 MED ORDER — DEXAMETHASONE 4 MG PO TABS
8.0000 mg | ORAL_TABLET | Freq: Every day | ORAL | 5 refills | Status: DC
  Filled 2021-05-07: qty 8, 4d supply, fill #0

## 2021-05-07 MED ORDER — LIDOCAINE-EPINEPHRINE (PF) 1 %-1:200000 IJ SOLN
INTRAMUSCULAR | Status: AC | PRN
  Administered 2021-05-07: 20 mL

## 2021-05-07 MED ORDER — LORAZEPAM 0.5 MG PO TABS
0.5000 mg | ORAL_TABLET | Freq: Four times a day (QID) | ORAL | 0 refills | Status: DC | PRN
  Filled 2021-05-07: qty 30, 8d supply, fill #0

## 2021-05-07 MED ORDER — MIDAZOLAM HCL 2 MG/2ML IJ SOLN
INTRAMUSCULAR | Status: AC
  Filled 2021-05-07: qty 4

## 2021-05-07 MED ORDER — MIDAZOLAM HCL 2 MG/2ML IJ SOLN
INTRAMUSCULAR | Status: AC | PRN
  Administered 2021-05-07 (×4): 1 mg via INTRAVENOUS

## 2021-05-07 MED ORDER — LIDOCAINE-PRILOCAINE 2.5-2.5 % EX CREA
TOPICAL_CREAM | CUTANEOUS | 3 refills | Status: DC
  Filled 2021-05-07: qty 30, 30d supply, fill #0

## 2021-05-07 MED ORDER — FENTANYL CITRATE (PF) 100 MCG/2ML IJ SOLN
INTRAMUSCULAR | Status: AC | PRN
  Administered 2021-05-07 (×2): 50 ug via INTRAVENOUS

## 2021-05-07 MED ORDER — HEPARIN SOD (PORK) LOCK FLUSH 100 UNIT/ML IV SOLN
INTRAVENOUS | Status: AC | PRN
  Administered 2021-05-07: 500 [IU] via INTRAVENOUS

## 2021-05-07 MED ORDER — FENTANYL CITRATE (PF) 100 MCG/2ML IJ SOLN
INTRAMUSCULAR | Status: AC
  Filled 2021-05-07: qty 2

## 2021-05-07 MED ORDER — HEPARIN SOD (PORK) LOCK FLUSH 100 UNIT/ML IV SOLN
INTRAVENOUS | Status: AC
  Filled 2021-05-07: qty 5

## 2021-05-07 MED ORDER — PROCHLORPERAZINE MALEATE 10 MG PO TABS
10.0000 mg | ORAL_TABLET | Freq: Four times a day (QID) | ORAL | 1 refills | Status: DC | PRN
  Filled 2021-05-07: qty 30, 8d supply, fill #0

## 2021-05-07 NOTE — Addendum Note (Signed)
Addended by: Sullivan Lone on: 05/07/2021 09:20 AM   Modules accepted: Orders

## 2021-05-07 NOTE — H&P (Signed)
Chief Complaint: Patient was seen in consultation today for [facility] placement at the request of [name]  Referring Physician(s): [name]  Supervising Physician: Ruthann Cancer  Patient Status: [place]  History of Present Illness: James Coffey is a 50 y.o. male with PMHs of colon and rectal CA, diagnosed on 12/17/20.  Patient has been under the care of Dr. Irene Limbo, and he has been treated with oxaliplatin. 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 Patient had a follow up visit with Dr. Irene Limbo on 11/17, who recommended more aggressive systemic chemotherapy followed by surgical resection to the patient.  After thorough discussion and shared decision making, patient decided to proceed.   IR was requested for [facility] placement.   Patient speaks [place], an interpreter was utilized for the assessment.  Patient laying in bed, not in acute distress.  Denise headache, fever, chills, shortness of breath, cough, chest pain, abdominal pain, nausea ,vomiting, and bleeding.   Past Medical History:  Diagnosis Date   Cancer ([place])    Tobacco abuse     Past Surgical History:  Procedure Laterality Date   HERNIA REPAIR     INCISION AND DRAINAGE ABSCESS N/A 12/01/2020   Procedure: INCISION AND DRAINAGE ABSCESS;  Surgeon: Michael Boston, MD;  Location: [facility];  Service: General;  Laterality: N/A;   INCISION AND DRAINAGE ABSCESS N/A 12/04/2020   Procedure: INCISION AND DRAINAGE FOURNIERS GANGRENE;  Surgeon: [name], MD;  Location: [facility];  Service: General;  Laterality: N/A;   INCISION AND DRAINAGE PERIRECTAL ABSCESS N/A 11/30/2020   Procedure: INCISION AND DEBRIDEMENT OF PERIRECTAL ABSCESS AND RECTAL WALL BIOPSIES;  Surgeon: Michael Boston, MD;  Location: [facility];  Service: General;  Laterality: N/A;   INGUINAL HERNIA REPAIR Right 2007   LAPAROSCOPIC LOOP  COLOSTOMY N/A 12/13/2020   Procedure: DIAGNOSTIC LAPAROSCOPY WITH  OPEN DESCENDING LOOP COLOSTOMY;  Surgeon: Armandina Gemma, MD;  Location: [facility];  Service: General;  Laterality: N/A;   RECTAL SURGERY      Allergies: Patient has no known allergies.  Medications: Prior to Admission medications   Medication Sig Start Date End Date Taking? Authorizing Provider  dexamethasone (DECADRON) 4 MG tablet Take 2 tablets (8 mg total) by mouth daily. Start the day after chemotherapy for 3 days. Take with food. 05/07/21  Yes [name], MD  ibuprofen (ADVIL) 600 MG tablet Take 1 tablet (600 mg total) by mouth 3 (three) times daily. 12/19/20  Yes Norm Parcel, PA-C  loperamide (IMODIUM) 2 MG capsule Take 2 tablets by mouth at onset of diarrhea, then 1 tab every 2hrs until 12hr without a BM. May take 2 tab every 4hrs at bedtime. If diarrhea recurs repeat. 05/07/21  Yes [name], MD  LORazepam (ATIVAN) 0.5 MG tablet Take 1 tablet by mouth every 6 hours as needed for anxiety. 05/07/21  Yes [name], MD  ondansetron (ZOFRAN) 8 MG tablet Take 1 tablet (8 mg total) by mouth 2 (two) times daily as needed. Start on day 3 after chemotherapy. 05/07/21  Yes [name], MD  prochlorperazine (COMPAZINE) 10 MG tablet Take 1 tablet (10 mg total) by mouth every 6 (six) hours as needed (Nausea or vomiting). 05/07/21  Yes [name], MD  acetaminophen (TYLENOL) 500 MG tablet Take 2 tablets (1,000 mg total) by mouth every 8 (eight) hours as needed for mild pain or fever.  12/19/20   Norm Parcel, PA-C  bacitracin ointment Apply topically daily. Apply to lateral flank wounds 12/19/20   Barb Merino, MD  ferrous sulfate 325 (65 FE) MG tablet Take 1 tablet (325 mg total) by mouth daily with breakfast. 12/20/20 01/19/21  Barb Merino, MD  lidocaine-prilocaine (EMLA) cream Apply to affected area once 05/07/21   [name], MD  methocarbamol (ROBAXIN) 500 MG tablet  Take 1 tablet (500 mg total) by mouth every 8 (eight) hours as needed for muscle spasms. 12/19/20   Barb Merino, MD  potassium chloride SA (KLOR-CON) 20 MEQ tablet Take 1 tablet (20 mEq total) by mouth daily for 14 days. 12/19/20 01/02/21  Barb Merino, MD  traMADol (ULTRAM) 50 MG tablet Take 1 tablet by mouth every 8 hours as needed. 03/26/21   [name], MD     Family History  Problem Relation Age of Onset   CAD Neg Hx    Inflammatory bowel disease Neg Hx     Social History   Socioeconomic History   Marital status: Married    Spouse name: Not on file   Number of children: Not on file   Years of education: Not on file   Highest education level: Not on file  Occupational History   Occupation: truck driver  Tobacco Use   Smoking status: Every Day    Packs/day: 1.00    Types: Cigarettes   Smokeless tobacco: Never  Vaping Use   Vaping Use: Never used  Substance and Sexual Activity   Alcohol use: No   Drug use: No   Sexual activity: Never  Other Topics Concern   Not on file  Social History Narrative   From [place].  Came to the [place] in 2000.   Social Determinants of Health   Financial Resource Strain: Not on file  Food Insecurity: Not on file  Transportation Needs: Not on file  Physical Activity: Not on file  Stress: Not on file  Social Connections: Not on file     Review of Systems: A 12 point ROS discussed and pertinent positives are indicated in the HPI above.  All other systems are negative.  Vital Signs: BP 122/79   Pulse 82   Temp 98.2 F (36.8 C) (Oral)   Resp 18   SpO2 98%   Physical Exam Vitals and nursing note reviewed.  Constitutional:      General: Patient is not in acute distress.    Appearance: Normal appearance. Patient is not ill-appearing.  HENT:     Head: Normocephalic and atraumatic.     Mouth/Throat:     Mouth: Mucous membranes are moist.     Pharynx: Oropharynx is clear.  Cardiovascular:     Rate and Rhythm: Normal rate  and regular rhythm.     Pulses: Normal pulses.     Heart sounds: Normal heart sounds.  Pulmonary:     Effort: Pulmonary effort is normal.     Breath sounds: Normal breath sounds.  Abdominal:     General: Abdomen is flat. Bowel sounds are normal.     Palpations: Abdomen is soft.  Musculoskeletal:     Cervical back: Neck supple.  Skin:    General: Skin is warm and dry.     Coloration: Skin is not jaundiced or pale.  Neurological:     Mental Status: Patient is alert and oriented to person, place, and time.  Psychiatric:        Mood and Affect: Mood normal.  Behavior: Behavior normal.        Judgment: Judgment normal.    MD Evaluation Airway: WNL Heart: WNL Abdomen: WNL Chest/ Lungs: WNL ASA  Classification: 3 Mallampati/Airway Score: Two  Imaging: MR Abdomen W Wo Contrast  Result Date: 05/02/2021 CLINICAL DATA:  Indeterminate hepatic lesions on recent CT. Rectal carcinoma. EXAM: MRI ABDOMEN WITHOUT AND WITH CONTRAST TECHNIQUE: Multiplanar multisequence MR imaging of the abdomen was performed both before and after the administration of intravenous contrast. CONTRAST:  41mL GADAVIST GADOBUTROL 1 MMOL/ML IV SOLN COMPARISON:  CT on 04/21/2021 and 11/30/2020 FINDINGS: Lower chest: No acute findings. Hepatobiliary: Image degradation by motion artifact noted. A hypovascular mass is seen in the anterior right hepatic lobe measures 1.2 x 1.1 cm, new compared to CT on 11/30/2020. A 2nd mass showing T2 hypointensity is seen in the lateral segment of the left hepatic lobe, measuring 1.8 x 1.7 cm on image 17/5. This is not well visualized on postcontrast sequences due to motion artifact in this region, but does show restricted diffusion. This is also consistent with the liver metastasis. Gallbladder is unremarkable. No evidence of biliary ductal dilatation. Pancreas:  No mass or inflammatory changes. Spleen:  Within normal limits in size and appearance. Adrenals/Urinary Tract: No masses  identified. Small right renal cyst noted. No evidence of hydronephrosis. Stomach/Bowel: Left lower quadrant colostomy again seen. Otherwise unremarkable. Vascular/Lymphatic: No pathologically enlarged lymph nodes identified. No acute vascular findings. Other:  None. Musculoskeletal:  No suspicious bone lesions identified. IMPRESSION: Image degradation by motion artifact noted. Two small hypovascular masses in the anterior right hepatic lobe and lateral segment of the left hepatic lobe, which are new since prior CT and consistent with liver metastasis. No other sites of abdominal metastatic disease identified. Electronically Signed   By: Marlaine Hind M.D.   On: 05/02/2021 17:12   CT CHEST ABDOMEN PELVIS W CONTRAST  Result Date: 04/21/2021 CLINICAL DATA:  Colorectal cancer. Locally advanced colorectal cancer. Chemotherapy complete. EXAM: CT CHEST, ABDOMEN, AND PELVIS WITH CONTRAST TECHNIQUE: Multidetector CT imaging of the chest, abdomen and pelvis was performed following the standard protocol during bolus administration of intravenous contrast. CONTRAST:  51mL OMNIPAQUE IOHEXOL 350 MG/ML SOLN COMPARISON:  CT 11/30/2020 FINDINGS: CT CHEST FINDINGS Cardiovascular: No significant vascular findings. Normal heart size. No pericardial effusion. Mediastinum/Nodes: No axillary or supraclavicular adenopathy. No mediastinal or hilar adenopathy. No pericardial fluid. Esophagus normal. Lungs/Pleura: No suspicious pulmonary nodules. Normal pleural. Airways normal. Musculoskeletal: No aggressive osseous lesion. CT ABDOMEN AND PELVIS FINDINGS Hepatobiliary: New round 14 mm hypodense lesion in the RIGHT hepatic lobe (image 60/series 2). Tiny hypodense lesion in the LEFT lateral hepatic lobe measures 4 mm (image 53/2). Pancreas: Pancreas is normal. No ductal dilatation. No pancreatic inflammation. Spleen: Normal spleen Adrenals/urinary tract: Adrenal glands and kidneys are normal. The ureters and bladder normal. Thin dependent  calcification in the bladder (image 121/2 Stomach/Bowel: Stomach, duodenum and small-bowel are normal. Normal cecum. Descending colon exits a LEFT abdominal wall colostomy. Loop colostomy. The distal LEFT colon is collapsed in the sigmoid colon up to the rectum. Reduction in rectal wall thickening. There is a persistent lymph node in the presacral space measuring 18 mm decreased from 25 mm remeasured. Small presacral lymph node measuring 9 mm image 111/12 is similar. Vascular/Lymphatic: Abdominal aorta is normal caliber. There is no retroperitoneal or periportal lymphadenopathy. See GI section for description of presacral adenopathy Reproductive: Prostate unremarkable. Resolution of peritoneal infection at the base of the penis. Other: No peritoneal metastasis identified. Musculoskeletal:  No aggressive osseous lesion. IMPRESSION: Chest Impression: 1. No evidence of thoracic metastasis. Abdomen / Pelvis Impression: 1. Unfortunately there is a new hypodense lesion in the RIGHT hepatic lobe which is highly concerning for COLORECTAL CARCINOMA METASTASIS. 2. Smaller hypodense lesion in the LEFT lateral hepatic lobe is less specific but also concerning. 3. Interval loop colostomy without complication. 4. Soft tissue nodule in the presacral space is decreased in size. Persistent small presacral node. 5. Interval RESOLUTION of aggressive infection in the perineum seen on comparison CT. These results will be called to the ordering clinician or representative by the Radiologist Assistant, and communication documented in the PACS or [employer]. Electronically Signed   By: Suzy Bouchard M.D.   On: 04/21/2021 11:56    Labs:  CBC: Recent Labs    02/11/21 1024 03/05/21 1057 03/26/21 1027 04/16/21 0855  WBC 9.3 9.4 8.0 7.4  HGB 12.8* 13.3 12.9* 13.0  HCT 38.4* 38.7* 37.2* 37.3*  PLT 254 306 166 174    COAGS: No results for input(s): INR, APTT in the last 8760 hours.  BMP: Recent Labs    02/11/21 1026  03/05/21 1057 03/26/21 1027 04/16/21 0855  NA 138 140 140 141  K 3.9 4.0 3.8 3.5  CL 106 109 112* 112*  CO2 23 21* 19* 21*  GLUCOSE 192* 133* 148* 130*  BUN 10 11 12 9   CALCIUM 9.5 9.9 9.4 9.5  CREATININE 0.77 0.74 0.72 0.73  GFRNONAA >60 >60 >60 >60    LIVER FUNCTION TESTS: Recent Labs    02/11/21 1026 03/05/21 1057 03/26/21 1027 04/16/21 0855  BILITOT 0.4 0.6 1.4* 1.7*  AST 14* 13* 13* 19  ALT 16 14 12 16   ALKPHOS 96 98 93 102  PROT 7.1 7.3 7.0 7.2  ALBUMIN 3.5 3.7 3.7 3.9    TUMOR MARKERS: No results for input(s): AFPTM, CEA, CA199, CHROMGRNA in the last 8760 hours.  Assessment and Plan: 50 y.o. male with rectal CA, in need of long term venous access for systemic chemotherapy.   IR was requested for image guided PAC placement. Patient presents to [facility] IR today for the procedure.  NPO since 8:30 am VSS Not on AC/AP  Risks and benefits of image guided port-a-catheter placement was discussed with the patient including, but not limited to bleeding, infection, pneumothorax, or fibrin sheath development and need for additional procedures.  All of the patient's questions were answered, patient is agreeable to proceed. Consent signed and in chart.  Thank you for this interesting consult.  I greatly enjoyed meeting [employer] and look forward to participating in their care.  A copy of this report was sent to the requesting provider on this date.  Electronically Signed: Tera Mater, PA-C 05/07/2021, 2:20 PM   I spent a total of  40 Minutes   in face to face in clinical consultation, greater than 50% of which was counseling/coordinating care for [facility] placement.   This chart was dictated using voice recognition software.  Despite best efforts to proofread,  errors can occur which can change the documentation meaning.

## 2021-05-07 NOTE — Progress Notes (Signed)
DISCONTINUE ON PATHWAY REGIMEN - Colorectal     A cycle is every 21 days:     Capecitabine      Oxaliplatin   **Always confirm dose/schedule in your pharmacy ordering system**  REASON: Disease Progression PRIOR TREATMENT: ROS57: CapeOx q21 Days x 6 Cycles TREATMENT RESPONSE: Partial Response (PR)  START OFF PATHWAY REGIMEN - Colorectal   [id number]:mFOLFIRINOX (Leucovorin IV D1 + Fluorouracil CIV D1,2 + Irinotecan IV D1 + Oxaliplatin IV D1) q14 Days:   A cycle is every 14 days:     Oxaliplatin      Leucovorin      Irinotecan      Fluorouracil   **Always confirm dose/schedule in your pharmacy ordering system**  Patient Characteristics: Distant Metastases, Resectable, Neoadjuvant Therapy Planned Tumor Location: Rectal Therapeutic Status: Distant Metastases  Intent of Therapy: Curative Intent, Discussed with Patient

## 2021-05-07 NOTE — Procedures (Signed)
Interventional Radiology Procedure Note ° °Procedure: Single Lumen Power Port Placement   ° °Access:  Right internal jugular vein ° °Findings: Catheter tip positioned at cavoatrial junction. Port is ready for immediate use.  ° °Complications: None ° °EBL: < 10 mL ° °Recommendations:  °- Ok to shower in 24 hours °- Do not submerge for 7 days °- Routine line care  ° ° °Loc Feinstein, MD ° ° ° °

## 2021-05-07 NOTE — Discharge Instructions (Signed)
Interventional radiology phone numbers °[phone] °After hours [phone] ° ° ° °You have skin glue (dermabond) over your new port. Do not use the lidocaine cream (EMLA cream) over the skin glue until it has healed. The petroleum in the lidocaine cream will dissolve the skin glue resulting in an infection of your new port. Use ice in a zip lock bag for 1-2 minutes over your new port before the cancer center nurses access your port. ° ° °Implanted Port Insertion, Care After °This sheet gives you information about how to care for yourself after your procedure. Your health care provider may also give you more specific instructions. If you have problems or questions, contact your health care provider. °What can I expect after the procedure? °After the procedure, it is common to have: °Discomfort at the port insertion site. °Bruising on the skin over the port. This should improve over 3-4 days. °Follow these instructions at home: °Port care °After your port is placed, you will get a manufacturer's information card. The card has information about your port. Keep this card with you at all times. °Take care of the port as told by your health care provider. Ask your health care provider if you or a family member can get training for taking care of the port at home. A home health care nurse may also take care of the port. °Make sure to remember what type of port you have. °Incision care °Follow instructions from your health care provider about how to take care of your port insertion site. Make sure you: °Wash your hands with soap and water before and after you change your bandage (dressing). If soap and water are not available, use hand sanitizer. °Change your dressing as told by your health care provider. °Leave skin glue in place. These skin closures may need to stay in place for 2 weeks or longer.  °Check your port insertion site every day for signs of infection. Check for: °Redness, swelling, or pain. °Fluid or  blood. °Warmth. °Pus or a bad smell.  °  °  °Activity °Return to your normal activities as told by your health care provider. Ask your health care provider what activities are safe for you. °Do not lift anything that is heavier than 10 lb (4.5 kg), or the limit that you are told, until your health care provider says that it is safe. °General instructions °Take over-the-counter and prescription medicines only as told by your health care provider. °Do not take baths, swim, or use a hot tub until your health care provider approves.You may remove your dressing tomorrow and shower 24 hours after your procedure. °Do not drive for 24 hours if you were given a sedative during your procedure. °Wear a medical alert bracelet in case of an emergency. This will tell any health care providers that you have a port. °Keep all follow-up visits as told by your health care provider. This is important. °Contact a health care provider if: °You cannot flush your port with saline as directed, or you cannot draw blood from the port. °You have a fever or chills. °You have redness, swelling, or pain around your port insertion site. °You have fluid or blood coming from your port insertion site. °Your port insertion site feels warm to the touch. °You have pus or a bad smell coming from the port insertion site. °Get help right away if: °You have chest pain or shortness of breath. °You have bleeding from your port that you cannot control. °Summary °Take care of   the port as told by your health care provider. Keep the manufacturer's information card with you at all times. °Change your dressing as told by your health care provider. °Contact a health care provider if you have a fever or chills or if you have redness, swelling, or pain around your port insertion site. °Keep all follow-up visits as told by your health care provider. °This information is not intended to replace advice given to you by your health care provider. Make sure you discuss any  questions you have with your health care provider. °Document Revised: 12/21/2017 Document Reviewed: 12/21/2017 °Elsevier Patient Education © 2021 Elsevier Inc. ° ° ° °Moderate Conscious Sedation, Adult, Care After °This sheet gives you information about how to care for yourself after your procedure. Your health care provider may also give you more specific instructions. If you have problems or questions, contact your health care provider. °What can I expect after the procedure? °After the procedure, it is common to have: °Sleepiness for several hours. °Impaired judgment for several hours. °Difficulty with balance. °Vomiting if you eat too soon. °Follow these instructions at home: °For the time period you were told by your health care provider: °Rest. °Do not participate in activities where you could fall or become injured. °Do not drive or use machinery. °Do not drink alcohol. °Do not take sleeping pills or medicines that cause drowsiness. °Do not make important decisions or sign legal documents. °Do not take care of children on your own.  °  °  °Eating and drinking °Follow the diet recommended by your health care provider. °Drink enough fluid to keep your urine pale yellow. °If you vomit: °Drink water, juice, or soup when you can drink without vomiting. °Make sure you have little or no nausea before eating solid foods.   °General instructions °Take over-the-counter and prescription medicines only as told by your health care provider. °Have a responsible adult stay with you for the time you are told. It is important to have someone help care for you until you are awake and alert. °Do not smoke. °Keep all follow-up visits as told by your health care provider. This is important. °Contact a health care provider if: °You are still sleepy or having trouble with balance after 24 hours. °You feel light-headed. °You keep feeling nauseous or you keep vomiting. °You develop a rash. °You have a fever. °You have redness or  swelling around the IV site. °Get help right away if: °You have trouble breathing. °You have new-onset confusion at home. °Summary °After the procedure, it is common to feel sleepy, have impaired judgment, or feel nauseous if you eat too soon. °Rest after you get home. Know the things you should not do after the procedure. °Follow the diet recommended by your health care provider and drink enough fluid to keep your urine pale yellow. °Get help right away if you have trouble breathing or new-onset confusion at home. °This information is not intended to replace advice given to you by your health care provider. Make sure you discuss any questions you have with your health care provider. °Document Revised: 09/22/2019 Document Reviewed: 04/20/2019 °Elsevier Patient Education © 2021 Elsevier Inc.  °

## 2021-05-07 NOTE — Progress Notes (Signed)
Foundation One testing requested on pt's initial surgical sample.

## 2021-05-08 NOTE — Progress Notes (Signed)
Referral to [facility], Dr Grier Mitts last ov note, demographics and insurance information faxed to [facility] at [phone].

## 2021-05-12 NOTE — Progress Notes (Signed)
I called [facility] to inquire regarding referral sent last week.  I spoke with James Coffey.  We made an appt for James Coffey with Dr [name] 05/19/2021 at 1600, arrival 1533.  I left a vm for [name].  She returned my call and I reviewed the appt.  She is aware of their location.  She asked when his next chemotherapy would be scheduled.  There are no infusion appts this am.  Review of Dr Grier Mitts LOS sent 11/17, indicates request to "start FOLFIRINOX in 7-10 days".  I will speak with Dr Grier Mitts nurse Ilda Foil for further information,

## 2021-05-21 ENCOUNTER — Encounter: Payer: Self-pay | Admitting: Hematology

## 2021-05-21 ENCOUNTER — Encounter (HOSPITAL_COMMUNITY): Payer: Self-pay | Admitting: Hematology

## 2021-05-21 NOTE — Progress Notes (Signed)
Created GFE(Good Faith Estimate) to provide to patient. 

## 2021-05-22 ENCOUNTER — Inpatient Hospital Stay (HOSPITAL_BASED_OUTPATIENT_CLINIC_OR_DEPARTMENT_OTHER): Payer: Medicaid Other | Admitting: Hematology

## 2021-05-22 ENCOUNTER — Inpatient Hospital Stay: Payer: Medicaid Other | Attending: Hematology

## 2021-05-22 ENCOUNTER — Inpatient Hospital Stay: Payer: Medicaid Other

## 2021-05-22 ENCOUNTER — Other Ambulatory Visit: Payer: Self-pay

## 2021-05-22 VITALS — BP 116/78 | HR 86 | Temp 98.0°F | Resp 18 | Wt 173.0 lb

## 2021-05-22 DIAGNOSIS — C20 Malignant neoplasm of rectum: Secondary | ICD-10-CM

## 2021-05-22 DIAGNOSIS — C787 Secondary malignant neoplasm of liver and intrahepatic bile duct: Secondary | ICD-10-CM | POA: Diagnosis not present

## 2021-05-22 DIAGNOSIS — Z5111 Encounter for antineoplastic chemotherapy: Secondary | ICD-10-CM

## 2021-05-22 DIAGNOSIS — C775 Secondary and unspecified malignant neoplasm of intrapelvic lymph nodes: Secondary | ICD-10-CM

## 2021-05-22 DIAGNOSIS — Z7189 Other specified counseling: Secondary | ICD-10-CM

## 2021-05-22 LAB — CBC WITH DIFFERENTIAL (CANCER CENTER ONLY)
Abs Immature Granulocytes: 0.06 10*3/uL (ref 0.00–0.07)
Basophils Absolute: 0.1 10*3/uL (ref 0.0–0.1)
Basophils Relative: 1 %
Eosinophils Absolute: 0.2 10*3/uL (ref 0.0–0.5)
Eosinophils Relative: 2 %
HCT: 38 % — ABNORMAL LOW (ref 39.0–52.0)
Hemoglobin: 13 g/dL (ref 13.0–17.0)
Immature Granulocytes: 0 %
Lymphocytes Relative: 18 %
Lymphs Abs: 2.6 10*3/uL (ref 0.7–4.0)
MCH: 33.2 pg (ref 26.0–34.0)
MCHC: 34.2 g/dL (ref 30.0–36.0)
MCV: 96.9 fL (ref 80.0–100.0)
Monocytes Absolute: 1.2 10*3/uL — ABNORMAL HIGH (ref 0.1–1.0)
Monocytes Relative: 8 %
Neutro Abs: 10 10*3/uL — ABNORMAL HIGH (ref 1.7–7.7)
Neutrophils Relative %: 71 %
Platelet Count: 230 10*3/uL (ref 150–400)
RBC: 3.92 MIL/uL — ABNORMAL LOW (ref 4.22–5.81)
RDW: 13 % (ref 11.5–15.5)
WBC Count: 14.1 10*3/uL — ABNORMAL HIGH (ref 4.0–10.5)
nRBC: 0 % (ref 0.0–0.2)

## 2021-05-22 LAB — CMP (CANCER CENTER ONLY)
ALT: 11 U/L (ref 0–44)
AST: 11 U/L — ABNORMAL LOW (ref 15–41)
Albumin: 3.7 g/dL (ref 3.5–5.0)
Alkaline Phosphatase: 117 U/L (ref 38–126)
Anion gap: 9 (ref 5–15)
BUN: 13 mg/dL (ref 6–20)
CO2: 23 mmol/L (ref 22–32)
Calcium: 9.3 mg/dL (ref 8.9–10.3)
Chloride: 109 mmol/L (ref 98–111)
Creatinine: 0.72 mg/dL (ref 0.61–1.24)
GFR, Estimated: >60 mL/min (ref >60)
Glucose, Bld: 145 mg/dL — ABNORMAL HIGH (ref 70–99)
Potassium: 4 mmol/L (ref 3.5–5.1)
Sodium: 141 mmol/L (ref 135–145)
Total Bilirubin: 1.5 mg/dL — ABNORMAL HIGH (ref 0.3–1.2)
Total Protein: 7.3 g/dL (ref 6.5–8.1)

## 2021-05-22 LAB — IRON AND TIBC
Iron: 36 ug/dL — ABNORMAL LOW (ref 42–163)
Saturation Ratios: 11 % — ABNORMAL LOW (ref 20–55)
TIBC: 336 ug/dL (ref 202–409)
UIBC: 300 ug/dL (ref 117–376)

## 2021-05-22 LAB — FERRITIN: Ferritin: 111 ng/mL (ref 24–336)

## 2021-05-22 LAB — VITAMIN B12: Vitamin B-12: 237 pg/mL (ref 180–914)

## 2021-05-22 MED ORDER — SODIUM CHLORIDE 0.9 % IV SOLN
2451.0000 mg/m2 | INTRAVENOUS | Status: DC
  Administered 2021-05-22: 5000 mg via INTRAVENOUS
  Filled 2021-05-22: qty 100

## 2021-05-22 MED ORDER — OXALIPLATIN CHEMO INJECTION 100 MG/20ML
85.0000 mg/m2 | Freq: Once | INTRAVENOUS | Status: AC
  Administered 2021-05-22: 175 mg via INTRAVENOUS
  Filled 2021-05-22: qty 35

## 2021-05-22 MED ORDER — SODIUM CHLORIDE 0.9 % IV SOLN
150.0000 mg | Freq: Once | INTRAVENOUS | Status: AC
  Administered 2021-05-22: 150 mg via INTRAVENOUS
  Filled 2021-05-22: qty 150

## 2021-05-22 MED ORDER — DEXTROSE 5 % IV SOLN: Freq: Once | INTRAVENOUS | Status: AC

## 2021-05-22 MED ORDER — SODIUM CHLORIDE 0.9% FLUSH
10.0000 mL | INTRAVENOUS | Status: DC | PRN
  Administered 2021-05-22: 10 mL

## 2021-05-22 MED ORDER — SODIUM CHLORIDE 0.9 % IV SOLN
150.0000 mg/m2 | Freq: Once | INTRAVENOUS | Status: AC
  Administered 2021-05-22: 300 mg via INTRAVENOUS
  Filled 2021-05-22: qty 15

## 2021-05-22 MED ORDER — SODIUM CHLORIDE 0.9 % IV SOLN
400.0000 mg/m2 | Freq: Once | INTRAVENOUS | Status: AC
  Administered 2021-05-22: 812 mg via INTRAVENOUS
  Filled 2021-05-22: qty 40.6

## 2021-05-22 MED ORDER — ATROPINE SULFATE 1 MG/ML IV SOLN
0.5000 mg | Freq: Once | INTRAVENOUS | Status: AC | PRN
  Administered 2021-05-22: 0.5 mg via INTRAVENOUS
  Filled 2021-05-22: qty 1

## 2021-05-22 MED ORDER — PALONOSETRON HCL INJECTION 0.25 MG/5ML
0.2500 mg | Freq: Once | INTRAVENOUS | Status: AC
  Administered 2021-05-22: 0.25 mg via INTRAVENOUS
  Filled 2021-05-22: qty 5

## 2021-05-22 MED ORDER — SODIUM CHLORIDE 0.9 % IV SOLN
10.0000 mg | Freq: Once | INTRAVENOUS | Status: AC
  Administered 2021-05-22: 10 mg via INTRAVENOUS
  Filled 2021-05-22: qty 10

## 2021-05-22 NOTE — Patient Instructions (Addendum)
[facility]  Discharge Instructions: Thank you for choosing [facility] to provide your oncology and hematology care.   If you have a lab appointment with the [facility], please go directly to the [facility] and check in at the registration area.   Wear comfortable clothing and clothing appropriate for easy access to any Portacath or PICC line.   We strive to give you quality time with your provider. You may need to reschedule your appointment if you arrive late (15 or more minutes).  Arriving late affects you and other patients whose appointments are after yours.  Also, if you miss three or more appointments without notifying the office, you may be dismissed from the clinic at the providers discretion.      For prescription refill requests, have your pharmacy contact our office and allow 72 hours for refills to be completed.    Today you received the following chemotherapy and/or immunotherapy agents: Oxaliplatin, Leucovorin, Irinotecan, & Fluorouracil      To help prevent nausea and vomiting after your treatment, we encourage you to take your nausea medication as directed.  BELOW ARE SYMPTOMS THAT SHOULD BE REPORTED IMMEDIATELY: *FEVER GREATER THAN 100.4 F (38 C) OR HIGHER *CHILLS OR SWEATING *NAUSEA AND VOMITING THAT IS NOT CONTROLLED WITH YOUR NAUSEA MEDICATION *UNUSUAL SHORTNESS OF BREATH *UNUSUAL BRUISING OR BLEEDING *URINARY PROBLEMS (pain or burning when urinating, or frequent urination) *BOWEL PROBLEMS (unusual diarrhea, constipation, pain near the anus) TENDERNESS IN MOUTH AND THROAT WITH OR WITHOUT PRESENCE OF ULCERS (sore throat, sores in mouth, or a toothache) UNUSUAL RASH, SWELLING OR PAIN  UNUSUAL VAGINAL DISCHARGE OR ITCHING   Items with * indicate a potential emergency and should be followed up as soon as possible or go to the Emergency Department if any problems should occur.  Please show the CHEMOTHERAPY ALERT CARD  or IMMUNOTHERAPY ALERT CARD at check-in to the Emergency Department and triage nurse.  Should you have questions after your visit or need to cancel or reschedule your appointment, please contact [facility]  Dept: [phone]  and follow the prompts.  Office hours are 8:00 a.m. to 4:30 p.m. Monday - Friday. Please note that voicemails left after 4:00 p.m. may not be returned until the following business day.  We are closed weekends and major holidays. You have access to a nurse at all times for urgent questions. Please call the main number to the clinic Dept: [phone] and follow the prompts.   For any non-urgent questions, you may also contact your provider using MyChart. We now offer e-Visits for anyone 15 and older to request care online for non-urgent symptoms. For details visit [email or web address].   Also download the MyChart app! Go to the app store, search "MyChart", open the app, select [facility], and log in with your MyChart username and password.  Due to Covid, a mask is required upon entering the hospital/clinic. If you do not have a mask, one will be given to you upon arrival. For doctor visits, patients may have 1 support person aged 8 or older with them. For treatment visits, patients cannot have anyone with them due to current Covid guidelines and our immunocompromised population.   Leucovorin injection What is this medication? LEUCOVORIN (loo koe VOR in) is used to prevent or treat the harmful effects of some medicines. This medicine is used to treat anemia caused by a low amount of folic acid in the body. It is also used  with 5-fluorouracil (5-FU) to treat colon cancer. This medicine may be used for other purposes; ask your health care provider or pharmacist if you have questions. What should I tell my care team before I take this medication? They need to know if you have any of these conditions: anemia from low levels of vitamin B-12 in the  blood an unusual or allergic reaction to leucovorin, folic acid, other medicines, foods, dyes, or preservatives pregnant or trying to get pregnant breast-feeding How should I use this medication? This medicine is for injection into a muscle or into a vein. It is given by a health care professional in a hospital or clinic setting. Talk to your pediatrician regarding the use of this medicine in children. Special care may be needed. Overdosage: If you think you have taken too much of this medicine contact a poison control center or emergency room at once. NOTE: This medicine is only for you. Do not share this medicine with others. What if I miss a dose? This does not apply. What may interact with this medication? capecitabine fluorouracil phenobarbital phenytoin primidone trimethoprim-sulfamethoxazole This list may not describe all possible interactions. Give your health care provider a list of all the medicines, herbs, non-prescription drugs, or dietary supplements you use. Also tell them if you smoke, drink alcohol, or use illegal drugs. Some items may interact with your medicine. What should I watch for while using this medication? Your condition will be monitored carefully while you are receiving this medicine. This medicine may increase the side effects of 5-fluorouracil, 5-FU. Tell your doctor or health care professional if you have diarrhea or mouth sores that do not get better or that get worse. What side effects may I notice from receiving this medication? Side effects that you should report to your doctor or health care professional as soon as possible: allergic reactions like skin rash, itching or hives, swelling of the face, lips, or tongue breathing problems fever, infection mouth sores unusual bleeding or bruising unusually weak or tired Side effects that usually do not require medical attention (report to your doctor or health care professional if they continue or are  bothersome): constipation or diarrhea loss of appetite nausea, vomiting This list may not describe all possible side effects. Call your doctor for medical advice about side effects. You may report side effects to FDA at 1-800-FDA-1088. Where should I keep my medication? This drug is given in a hospital or clinic and will not be stored at home. NOTE: This sheet is a summary. It may not cover all possible information. If you have questions about this medicine, talk to your doctor, pharmacist, or health care provider.  2022 Elsevier/Gold Standard (2007-12-01 00:00:00)  Irinotecan injection What is this medication? IRINOTECAN (ir in oh TEE kan ) is a chemotherapy drug. It is used to treat colon and rectal cancer. This medicine may be used for other purposes; ask your health care provider or pharmacist if you have questions. COMMON BRAND NAME(S): Camptosar What should I tell my care team before I take this medication? They need to know if you have any of these conditions: dehydration diarrhea infection (especially a virus infection such as chickenpox, cold sores, or herpes) liver disease low blood counts, like low white cell, platelet, or red cell counts low levels of calcium, magnesium, or potassium in the blood recent or ongoing radiation therapy an unusual or allergic reaction to irinotecan, other medicines, foods, dyes, or preservatives pregnant or trying to get pregnant breast-feeding How should I  use this medication? This drug is given as an infusion into a vein. It is administered in a hospital or clinic by a specially trained health care professional. Talk to your pediatrician regarding the use of this medicine in children. Special care may be needed. Overdosage: If you think you have taken too much of this medicine contact a poison control center or emergency room at once. NOTE: This medicine is only for you. Do not share this medicine with others. What if I miss a dose? It is  important not to miss your dose. Call your doctor or health care professional if you are unable to keep an appointment. What may interact with this medication? Do not take this medicine with any of the following medications: cobicistat itraconazole This medicine may interact with the following medications: antiviral medicines for HIV or AIDS certain antibiotics like rifampin or rifabutin certain medicines for fungal infections like ketoconazole, posaconazole, and voriconazole certain medicines for seizures like carbamazepine, phenobarbital, phenotoin clarithromycin gemfibrozil nefazodone St. John's Wort This list may not describe all possible interactions. Give your health care provider a list of all the medicines, herbs, non-prescription drugs, or dietary supplements you use. Also tell them if you smoke, drink alcohol, or use illegal drugs. Some items may interact with your medicine. What should I watch for while using this medication? Your condition will be monitored carefully while you are receiving this medicine. You will need important blood work done while you are taking this medicine. This drug may make you feel generally unwell. This is not uncommon, as chemotherapy can affect healthy cells as well as cancer cells. Report any side effects. Continue your course of treatment even though you feel ill unless your doctor tells you to stop. In some cases, you may be given additional medicines to help with side effects. Follow all directions for their use. You may get drowsy or dizzy. Do not drive, use machinery, or do anything that needs mental alertness until you know how this medicine affects you. Do not stand or sit up quickly, especially if you are an older patient. This reduces the risk of dizzy or fainting spells. Call your health care professional for advice if you get a fever, chills, or sore throat, or other symptoms of a cold or flu. Do not treat yourself. This medicine decreases your  body's ability to fight infections. Try to avoid being around people who are sick. Avoid taking products that contain aspirin, acetaminophen, ibuprofen, naproxen, or ketoprofen unless instructed by your doctor. These medicines may hide a fever. This medicine may increase your risk to bruise or bleed. Call your doctor or health care professional if you notice any unusual bleeding. Be careful brushing and flossing your teeth or using a toothpick because you may get an infection or bleed more easily. If you have any dental work done, tell your dentist you are receiving this medicine. Do not become pregnant while taking this medicine or for 6 months after stopping it. Women should inform their health care professional if they wish to become pregnant or think they might be pregnant. Men should not father a child while taking this medicine and for 3 months after stopping it. There is potential for serious side effects to an unborn child. Talk to your health care professional for more information. Do not breast-feed an infant while taking this medicine or for 7 days after stopping it. This medicine has caused ovarian failure in some women. This medicine may make it more difficult to get  pregnant. Talk to your health care professional if you are concerned about your fertility. This medicine has caused decreased sperm counts in some men. This may make it more difficult to father a child. Talk to your health care professional if you are concerned about your fertility. What side effects may I notice from receiving this medication? Side effects that you should report to your doctor or health care professional as soon as possible: allergic reactions like skin rash, itching or hives, swelling of the face, lips, or tongue chest pain diarrhea flushing, runny nose, sweating during infusion low blood counts - this medicine may decrease the number of white blood cells, red blood cells and platelets. You may be at  increased risk for infections and bleeding. nausea, vomiting pain, swelling, warmth in the leg signs of decreased platelets or bleeding - bruising, pinpoint red spots on the skin, black, tarry stools, blood in the urine signs of infection - fever or chills, cough, sore throat, pain or difficulty passing urine signs of decreased red blood cells - unusually weak or tired, fainting spells, lightheadedness Side effects that usually do not require medical attention (report to your doctor or health care professional if they continue or are bothersome): constipation hair loss headache loss of appetite mouth sores stomach pain This list may not describe all possible side effects. Call your doctor for medical advice about side effects. You may report side effects to FDA at 1-800-FDA-1088. Where should I keep my medication? This drug is given in a hospital or clinic and will not be stored at home. NOTE: This sheet is a summary. It may not cover all possible information. If you have questions about this medicine, talk to your doctor, pharmacist, or health care provider.  2022 Elsevier/Gold Standard (2021-02-11 00:00:00)  Fluorouracil, 5-FU injection What is this medication? FLUOROURACIL, 5-FU (flure oh YOOR a sil) is a chemotherapy drug. It slows the growth of cancer cells. This medicine is used to treat many types of cancer like breast cancer, colon or rectal cancer, pancreatic cancer, and stomach cancer. This medicine may be used for other purposes; ask your health care provider or pharmacist if you have questions. COMMON BRAND NAME(S): Adrucil What should I tell my care team before I take this medication? They need to know if you have any of these conditions: blood disorders dihydropyrimidine dehydrogenase (DPD) deficiency infection (especially a virus infection such as chickenpox, cold sores, or herpes) kidney disease liver disease malnourished, poor nutrition recent or ongoing radiation  therapy an unusual or allergic reaction to fluorouracil, other chemotherapy, other medicines, foods, dyes, or preservatives pregnant or trying to get pregnant breast-feeding How should I use this medication? This drug is given as an infusion or injection into a vein. It is administered in a hospital or clinic by a specially trained health care professional. Talk to your pediatrician regarding the use of this medicine in children. Special care may be needed. Overdosage: If you think you have taken too much of this medicine contact a poison control center or emergency room at once. NOTE: This medicine is only for you. Do not share this medicine with others. What if I miss a dose? It is important not to miss your dose. Call your doctor or health care professional if you are unable to keep an appointment. What may interact with this medication? Do not take this medicine with any of the following medications: live virus vaccines This medicine may also interact with the following medications: medicines that treat or prevent  blood clots like warfarin, enoxaparin, and dalteparin This list may not describe all possible interactions. Give your health care provider a list of all the medicines, herbs, non-prescription drugs, or dietary supplements you use. Also tell them if you smoke, drink alcohol, or use illegal drugs. Some items may interact with your medicine. What should I watch for while using this medication? Visit your doctor for checks on your progress. This drug may make you feel generally unwell. This is not uncommon, as chemotherapy can affect healthy cells as well as cancer cells. Report any side effects. Continue your course of treatment even though you feel ill unless your doctor tells you to stop. In some cases, you may be given additional medicines to help with side effects. Follow all directions for their use. Call your doctor or health care professional for advice if you get a fever,  chills or sore throat, or other symptoms of a cold or flu. Do not treat yourself. This drug decreases your body's ability to fight infections. Try to avoid being around people who are sick. This medicine may increase your risk to bruise or bleed. Call your doctor or health care professional if you notice any unusual bleeding. Be careful brushing and flossing your teeth or using a toothpick because you may get an infection or bleed more easily. If you have any dental work done, tell your dentist you are receiving this medicine. Avoid taking products that contain aspirin, acetaminophen, ibuprofen, naproxen, or ketoprofen unless instructed by your doctor. These medicines may hide a fever. Do not become pregnant while taking this medicine. Women should inform their doctor if they wish to become pregnant or think they might be pregnant. There is a potential for serious side effects to an unborn child. Talk to your health care professional or pharmacist for more information. Do not breast-feed an infant while taking this medicine. Men should inform their doctor if they wish to father a child. This medicine may lower sperm counts. Do not treat diarrhea with over the counter products. Contact your doctor if you have diarrhea that lasts more than 2 days or if it is severe and watery. This medicine can make you more sensitive to the sun. Keep out of the sun. If you cannot avoid being in the sun, wear protective clothing and use sunscreen. Do not use sun lamps or tanning beds/booths. What side effects may I notice from receiving this medication? Side effects that you should report to your doctor or health care professional as soon as possible: allergic reactions like skin rash, itching or hives, swelling of the face, lips, or tongue low blood counts - this medicine may decrease the number of white blood cells, red blood cells and platelets. You may be at increased risk for infections and bleeding. signs of  infection - fever or chills, cough, sore throat, pain or difficulty passing urine signs of decreased platelets or bleeding - bruising, pinpoint red spots on the skin, black, tarry stools, blood in the urine signs of decreased red blood cells - unusually weak or tired, fainting spells, lightheadedness breathing problems changes in vision chest pain mouth sores nausea and vomiting pain, swelling, redness at site where injected pain, tingling, numbness in the hands or feet redness, swelling, or sores on hands or feet stomach pain unusual bleeding Side effects that usually do not require medical attention (report to your doctor or health care professional if they continue or are bothersome): changes in finger or toe nails diarrhea dry or itchy skin  hair loss headache loss of appetite sensitivity of eyes to the light stomach upset unusually teary eyes This list may not describe all possible side effects. Call your doctor for medical advice about side effects. You may report side effects to FDA at 1-800-FDA-1088. Where should I keep my medication? This drug is given in a hospital or clinic and will not be stored at home. NOTE: This sheet is a summary. It may not cover all possible information. If you have questions about this medicine, talk to your doctor, pharmacist, or health care provider.  2022 Elsevier/Gold Standard (2021-02-11 00:00:00)  The chemotherapy medication bag should finish at 46 hours, 96 hours, or 7 days. For example, if your pump is scheduled for 46 hours and it was put on at 4:00 p.m., it should finish at 2:00 p.m. the day it is scheduled to come off regardless of your appointment time.     Estimated time to finish at 1:15 PM on 05/24/2021.   If the display on your pump reads "Low Volume" and it is beeping, take the batteries out of the pump and come to the cancer center for it to be taken off.   If the pump alarms go off prior to the pump reading "Low Volume" then  call [phone] and someone can assist you.  If the plunger comes out and the chemotherapy medication is leaking out, please use your home chemo spill kit to clean up the spill. Do NOT use paper towels or other household products.  If you have problems or questions regarding your pump, please call either [phone] (24 hours a day) or the cancer center Monday-Friday 8:00 a.m.- 4:30 p.m. at the clinic number and we will assist you. If you are unable to get assistance, then go to the nearest Emergency Department and ask the staff to contact the IV team for assistance.

## 2021-05-22 NOTE — Progress Notes (Signed)
Per MD OK to speed up 5FU pump to infuse over 43 hours instead of 46 hours. Pump to infuse at a rate of 5.81 mL/hr instead of 5.43 mL/hr.

## 2021-05-22 NOTE — Progress Notes (Signed)
Confirmed treatment plan with Dr. Irene Limbo. Proceed with Folfirinox.  Raul Del Snover, [place], BCPS, BCOP 05/22/2021 12:41 PM

## 2021-05-23 ENCOUNTER — Telehealth: Payer: Self-pay | Admitting: Hematology

## 2021-05-23 ENCOUNTER — Telehealth: Payer: Self-pay | Admitting: *Deleted

## 2021-05-23 NOTE — Telephone Encounter (Signed)
Called & spoke with pt's daughter who speaks english & asked how he did with his treatment.  She reports that he is doing well.  He denies any problems & knows appts & how to reach [place] if needed.

## 2021-05-23 NOTE — Telephone Encounter (Signed)
Scheduled follow-up appointments per 12/15 los. Patient's daughter is aware.

## 2021-05-23 NOTE — Telephone Encounter (Signed)
-----   Message from Willis Modena, RN sent at 05/22/2021  5:13 PM EST ----- Regarding: Dr. Irene Limbo 1st time chemo f/u tol well

## 2021-05-24 ENCOUNTER — Inpatient Hospital Stay: Payer: Medicaid Other

## 2021-05-24 ENCOUNTER — Other Ambulatory Visit: Payer: Self-pay

## 2021-05-24 VITALS — BP 115/78 | HR 89 | Temp 98.0°F | Resp 16

## 2021-05-24 DIAGNOSIS — Z95828 Presence of other vascular implants and grafts: Secondary | ICD-10-CM

## 2021-05-24 DIAGNOSIS — Z5111 Encounter for antineoplastic chemotherapy: Secondary | ICD-10-CM | POA: Diagnosis not present

## 2021-05-24 MED ORDER — SODIUM CHLORIDE 0.9% FLUSH
10.0000 mL | Freq: Once | INTRAVENOUS | Status: AC
  Administered 2021-05-24: 10 mL

## 2021-05-24 MED ORDER — HEPARIN SOD (PORK) LOCK FLUSH 100 UNIT/ML IV SOLN
500.0000 [IU] | Freq: Once | INTRAVENOUS | Status: AC
  Administered 2021-05-24: 500 [IU]

## 2021-06-05 ENCOUNTER — Inpatient Hospital Stay (HOSPITAL_BASED_OUTPATIENT_CLINIC_OR_DEPARTMENT_OTHER): Payer: Medicaid Other | Admitting: Hematology

## 2021-06-05 ENCOUNTER — Other Ambulatory Visit: Payer: Self-pay

## 2021-06-05 ENCOUNTER — Encounter: Payer: Self-pay | Admitting: Hematology

## 2021-06-05 ENCOUNTER — Inpatient Hospital Stay: Payer: Medicaid Other

## 2021-06-05 ENCOUNTER — Other Ambulatory Visit (HOSPITAL_COMMUNITY): Payer: Self-pay

## 2021-06-05 VITALS — BP 116/77 | HR 75 | Temp 97.9°F | Resp 16 | Wt 175.8 lb

## 2021-06-05 DIAGNOSIS — C20 Malignant neoplasm of rectum: Secondary | ICD-10-CM | POA: Diagnosis not present

## 2021-06-05 DIAGNOSIS — C787 Secondary malignant neoplasm of liver and intrahepatic bile duct: Secondary | ICD-10-CM

## 2021-06-05 DIAGNOSIS — Z7189 Other specified counseling: Secondary | ICD-10-CM

## 2021-06-05 DIAGNOSIS — K610 Anal abscess: Secondary | ICD-10-CM | POA: Diagnosis not present

## 2021-06-05 DIAGNOSIS — Z5111 Encounter for antineoplastic chemotherapy: Secondary | ICD-10-CM | POA: Diagnosis not present

## 2021-06-05 DIAGNOSIS — C775 Secondary and unspecified malignant neoplasm of intrapelvic lymph nodes: Secondary | ICD-10-CM

## 2021-06-05 LAB — CBC WITH DIFFERENTIAL (CANCER CENTER ONLY)
Abs Immature Granulocytes: 0.03 10*3/uL (ref 0.00–0.07)
Basophils Absolute: 0 10*3/uL (ref 0.0–0.1)
Basophils Relative: 1 %
Eosinophils Absolute: 0.2 10*3/uL (ref 0.0–0.5)
Eosinophils Relative: 4 %
HCT: 36.8 % — ABNORMAL LOW (ref 39.0–52.0)
Hemoglobin: 12.3 g/dL — ABNORMAL LOW (ref 13.0–17.0)
Immature Granulocytes: 1 %
Lymphocytes Relative: 52 %
Lymphs Abs: 2.2 10*3/uL (ref 0.7–4.0)
MCH: 32 pg (ref 26.0–34.0)
MCHC: 33.4 g/dL (ref 30.0–36.0)
MCV: 95.8 fL (ref 80.0–100.0)
Monocytes Absolute: 0.7 10*3/uL (ref 0.1–1.0)
Monocytes Relative: 18 %
Neutro Abs: 1 10*3/uL — ABNORMAL LOW (ref 1.7–7.7)
Neutrophils Relative %: 24 %
Platelet Count: 213 10*3/uL (ref 150–400)
RBC: 3.84 MIL/uL — ABNORMAL LOW (ref 4.22–5.81)
RDW: 12.8 % (ref 11.5–15.5)
WBC Count: 4.2 10*3/uL (ref 4.0–10.5)
nRBC: 0 % (ref 0.0–0.2)

## 2021-06-05 LAB — CMP (CANCER CENTER ONLY)
ALT: 18 U/L (ref 0–44)
AST: 12 U/L — ABNORMAL LOW (ref 15–41)
Albumin: 3.7 g/dL (ref 3.5–5.0)
Alkaline Phosphatase: 105 U/L (ref 38–126)
Anion gap: 5 (ref 5–15)
BUN: 13 mg/dL (ref 6–20)
CO2: 24 mmol/L (ref 22–32)
Calcium: 9.1 mg/dL (ref 8.9–10.3)
Chloride: 110 mmol/L (ref 98–111)
Creatinine: 0.73 mg/dL (ref 0.61–1.24)
GFR, Estimated: >60 mL/min (ref >60)
Glucose, Bld: 125 mg/dL — ABNORMAL HIGH (ref 70–99)
Potassium: 3.9 mmol/L (ref 3.5–5.1)
Sodium: 139 mmol/L (ref 135–145)
Total Bilirubin: 0.4 mg/dL (ref 0.3–1.2)
Total Protein: 6.9 g/dL (ref 6.5–8.1)

## 2021-06-05 MED ORDER — SODIUM CHLORIDE 0.9 % IV SOLN
5000.0000 mg | INTRAVENOUS | Status: DC
  Administered 2021-06-05: 17:00:00 5000 mg via INTRAVENOUS
  Filled 2021-06-05: qty 100

## 2021-06-05 MED ORDER — DEXTROSE 5 % IV SOLN: Freq: Once | INTRAVENOUS | Status: AC

## 2021-06-05 MED ORDER — SODIUM CHLORIDE 0.9 % IV SOLN
400.0000 mg/m2 | Freq: Once | INTRAVENOUS | Status: DC
Start: 2021-06-05 — End: 2021-06-05

## 2021-06-05 MED ORDER — ONDANSETRON HCL 8 MG PO TABS
8.0000 mg | ORAL_TABLET | Freq: Two times a day (BID) | ORAL | 1 refills | Status: DC | PRN
  Filled 2021-06-05: qty 30, 15d supply, fill #0

## 2021-06-05 MED ORDER — SODIUM CHLORIDE 0.9 % IV SOLN
150.0000 mg | Freq: Once | INTRAVENOUS | Status: AC
  Administered 2021-06-05: 12:00:00 150 mg via INTRAVENOUS
  Filled 2021-06-05: qty 150

## 2021-06-05 MED ORDER — SODIUM CHLORIDE 0.9 % IV SOLN
10.0000 mg | Freq: Once | INTRAVENOUS | Status: AC
  Administered 2021-06-05: 12:00:00 10 mg via INTRAVENOUS
  Filled 2021-06-05: qty 10

## 2021-06-05 MED ORDER — SODIUM CHLORIDE 0.9 % IV SOLN
400.0000 mg/m2 | Freq: Once | INTRAVENOUS | Status: AC
  Administered 2021-06-05: 15:00:00 812 mg via INTRAVENOUS
  Filled 2021-06-05: qty 40.6

## 2021-06-05 MED ORDER — ATROPINE SULFATE 1 MG/ML IV SOLN
0.5000 mg | Freq: Once | INTRAVENOUS | Status: AC | PRN
  Administered 2021-06-05: 15:00:00 0.5 mg via INTRAVENOUS
  Filled 2021-06-05: qty 1

## 2021-06-05 MED ORDER — AMOXICILLIN-POT CLAVULANATE 875-125 MG PO TABS
1.0000 | ORAL_TABLET | Freq: Two times a day (BID) | ORAL | 0 refills | Status: DC
Start: 2021-06-05 — End: 2021-06-19
  Filled 2021-06-05: qty 28, 14d supply, fill #0

## 2021-06-05 MED ORDER — LORAZEPAM 0.5 MG PO TABS
0.5000 mg | ORAL_TABLET | Freq: Four times a day (QID) | ORAL | 0 refills | Status: DC | PRN
  Filled 2021-06-05: qty 30, 8d supply, fill #0

## 2021-06-05 MED ORDER — OXALIPLATIN CHEMO INJECTION 100 MG/20ML
85.0000 mg/m2 | Freq: Once | INTRAVENOUS | Status: AC
  Administered 2021-06-05: 13:00:00 175 mg via INTRAVENOUS
  Filled 2021-06-05: qty 35

## 2021-06-05 MED ORDER — PALONOSETRON HCL INJECTION 0.25 MG/5ML
0.2500 mg | Freq: Once | INTRAVENOUS | Status: AC
  Administered 2021-06-05: 12:00:00 0.25 mg via INTRAVENOUS
  Filled 2021-06-05: qty 5

## 2021-06-05 MED ORDER — TRAMADOL HCL 50 MG PO TABS
50.0000 mg | ORAL_TABLET | Freq: Three times a day (TID) | ORAL | 0 refills | Status: DC | PRN
  Filled 2021-06-05: qty 30, 10d supply, fill #0

## 2021-06-05 MED ORDER — SODIUM CHLORIDE 0.9 % IV SOLN
150.0000 mg/m2 | Freq: Once | INTRAVENOUS | Status: AC
  Administered 2021-06-05: 15:00:00 300 mg via INTRAVENOUS
  Filled 2021-06-05: qty 15

## 2021-06-05 MED ORDER — PROCHLORPERAZINE MALEATE 10 MG PO TABS
10.0000 mg | ORAL_TABLET | Freq: Four times a day (QID) | ORAL | 1 refills | Status: DC | PRN
  Filled 2021-06-05: qty 30, 8d supply, fill #0

## 2021-06-05 NOTE — Progress Notes (Signed)
Ok to proceed with infusion today per Dr. Irene Limbo with ANC of 1.0

## 2021-06-06 ENCOUNTER — Telehealth: Payer: Self-pay | Admitting: *Deleted

## 2021-06-06 NOTE — Telephone Encounter (Signed)
Per Dr. Irene Limbo, okay to waste remaining 5FU from pump at 2pm on 06/07/21.

## 2021-06-06 NOTE — Progress Notes (Signed)
For pump d/c on 06/07/21 per Dr Irene Limbo: okay to discard remaining amount of 5FU in the pump at infusion room closing time tomorrow

## 2021-06-07 ENCOUNTER — Other Ambulatory Visit: Payer: Self-pay

## 2021-06-07 ENCOUNTER — Inpatient Hospital Stay: Payer: Medicaid Other

## 2021-06-07 VITALS — BP 115/76 | HR 77 | Temp 98.3°F | Resp 18

## 2021-06-07 DIAGNOSIS — C20 Malignant neoplasm of rectum: Secondary | ICD-10-CM

## 2021-06-07 DIAGNOSIS — Z7189 Other specified counseling: Secondary | ICD-10-CM

## 2021-06-07 DIAGNOSIS — Z5111 Encounter for antineoplastic chemotherapy: Secondary | ICD-10-CM | POA: Diagnosis not present

## 2021-06-07 MED ORDER — HEPARIN SOD (PORK) LOCK FLUSH 100 UNIT/ML IV SOLN
500.0000 [IU] | Freq: Once | INTRAVENOUS | Status: AC | PRN
  Administered 2021-06-07: 500 [IU]

## 2021-06-07 MED ORDER — SODIUM CHLORIDE 0.9% FLUSH
10.0000 mL | INTRAVENOUS | Status: DC | PRN
  Administered 2021-06-07: 10 mL

## 2021-06-09 ENCOUNTER — Encounter: Payer: Self-pay | Admitting: Hematology

## 2021-06-09 NOTE — Progress Notes (Signed)
[place]   [phone] Fax:(336) [phone]   Clinic Follow up Note   Patient Care Team: [name], MD as PCP - General (Hematology) [name], MD as Consulting Physician (Urology) [name], MD as Consulting Physician (Gastroenterology) [name], MD as Consulting Physician (Colon and Rectal Surgery) [name], MD as Consulting Physician (Oncology) Royston Bake, RN as Oncology Nurse Navigator (Oncology)  Date of Service:  .[name]12/15/2022   CHIEF COMPLAINT:  Follow-up for continued management of metastatic rectal adenocarcinoma and starting cycle 1 of modified FOLFIRINOX  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[name])  12/17/2020 Initial Diagnosis   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[name])   01/13/2021 Cancer Staging   Staging form: Colon and Rectum, AJCC 8th Edition - Clinical stage from 01/13/2021: Stage IIIB (cT3, cN2a, cM0) - Signed by [name], MD on 01/19/2021 Stage prefix: Initial diagnosis Histologic grade (G): G2 Histologic grading system: 4 grade system    01/20/2021 - 03/26/2021 Chemotherapy   Patient is on Treatment Plan : RECTAL Xelox ([place]apeox) q21d x 6 cycles     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 He notes no other acute new symptoms since his last clinic visit. He did have a surgical consultation and they recommended additional chemotherapy to evaluate for stability of his  disease prior to offering him surgical resection of his primary and liver mets. He has no new abdominal symptoms. Has been eating well. Notes some mild anxiety but has been sleeping okay. Interview done with help of video [place] interpreter.  MEDICAL HISTORY:  Past Medical History:  Diagnosis Date   Cancer ([place])    Tobacco abuse     SURGICAL HISTORY: Past Surgical History:  Procedure Laterality Date   HERNIA REPAIR     INCISION AND DRAINAGE ABSCESS N/A 12/01/2020   Procedure: INCISION AND DRAINAGE ABSCESS;  Surgeon: Michael Boston, MD;  Location: [facility];  Service: General;  Laterality: N/A;   INCISION AND DRAINAGE ABSCESS N/A 12/04/2020   Procedure: INCISION AND DRAINAGE FOURNIERS GANGRENE;  Surgeon: [name], MD;  Location: [facility];  Service: General;  Laterality: N/A;   INCISION AND DRAINAGE PERIRECTAL ABSCESS N/A 11/30/2020   Procedure: INCISION AND DEBRIDEMENT OF PERIRECTAL ABSCESS AND RECTAL WALL BIOPSIES;  Surgeon: Michael Boston, MD;  Location: [facility];  Service: General;  Laterality: N/A;   INGUINAL HERNIA REPAIR Right 2007   IR IMAGING GUIDED PORT INSERTION  05/07/2021   LAPAROSCOPIC LOOP COLOSTOMY N/A 12/13/2020   Procedure: DIAGNOSTIC LAPAROSCOPY WITH  OPEN DESCENDING LOOP COLOSTOMY;  Surgeon: Armandina Gemma, MD;  Location: [facility];  Service: General;  Laterality: N/A;   RECTAL SURGERY      I have reviewed the social history and family history with the patient and they are unchanged from previous note.  ALLERGIES:  is allergic to hydrocodone.  ROS 10 Point review of Systems was done is negative except as noted above.  MEDICATIONS:  Current Outpatient Medications  Medication Sig Dispense Refill   acetaminophen (  TYLENOL) 500 MG tablet Take 2 tablets (1,000 mg total) by mouth every 8 (eight) hours as needed for mild pain or fever.     amoxicillin-clavulanate (AUGMENTIN) 875-125 MG tablet Take 1 tablet by mouth 2 (two) times daily for 14 days. 28 tablet 0    bacitracin ointment Apply topically daily. Apply to lateral flank wounds 120 g 0   dexamethasone (DECADRON) 4 MG tablet Take 2 tablets (8 mg total) by mouth daily. Start the day after chemotherapy for 3 days. Take with food. 8 tablet 5   ferrous sulfate 325 (65 FE) MG tablet Take 1 tablet (325 mg total) by mouth daily with breakfast. 30 tablet 0   ibuprofen (ADVIL) 600 MG tablet Take 1 tablet (600 mg total) by mouth 3 (three) times daily. 30 tablet 0   lidocaine-prilocaine (EMLA) cream Apply to affected area once 30 g 3   loperamide (IMODIUM) 2 MG capsule Take 2 tablets by mouth at onset of diarrhea, then 1 tab every 2hrs until 12hr without a BM. May take 2 tab every 4hrs at bedtime. If diarrhea recurs repeat. 30 capsule 1   LORazepam (ATIVAN) 0.5 MG tablet Take 1 tablet by mouth every 6 hours as needed for anxiety. 30 tablet 0   methocarbamol (ROBAXIN) 500 MG tablet Take 1 tablet (500 mg total) by mouth every 8 (eight) hours as needed for muscle spasms. 30 tablet 0   ondansetron (ZOFRAN) 8 MG tablet Take 1 tablet (8 mg total) by mouth 2 (two) times daily as needed. Start on day 3 after chemotherapy. 30 tablet 1   potassium chloride SA (KLOR-CON) 20 MEQ tablet Take 1 tablet (20 mEq total) by mouth daily for 14 days. 14 tablet 0   prochlorperazine (COMPAZINE) 10 MG tablet Take 1 tablet (10 mg total) by mouth every 6 (six) hours as needed (Nausea or vomiting). 30 tablet 1   traMADol (ULTRAM) 50 MG tablet Take 1 tablet by mouth every 8 hours as needed. 30 tablet 0   No current facility-administered medications for this visit.   Facility-Administered Medications Ordered in Other Visits  Medication Dose Route Frequency Provider Last Rate Last Admin   palonosetron (ALOXI) 0.25 MG/5ML injection            sodium chloride flush (NS) 0.9 % injection 10 mL  10 mL Intracatheter Once [name], MD        PHYSICAL EXAMINATION: ECOG PERFORMANCE STATUS: 1 - Symptomatic but completely  ambulatory  There were no vitals filed for this visit.  Wt Readings from Last 3 Encounters:  06/05/21 175 lb 12.8 oz (79.7 kg)  05/22/21 173 lb (78.5 kg)  04/24/21 167 lb 6.4 oz (75.9 kg)  . GENERAL:alert, in no acute distress and comfortable SKIN: no acute rashes, no significant lesions EYES: conjunctiva are pink and non-injected, sclera anicteric OROPHARYNX: MMM, no exudates, no oropharyngeal erythema or ulceration NECK: supple, no JVD LYMPH:  no palpable lymphadenopathy in the cervical, axillary or inguinal regions LUNGS: clear to auscultation b/l with normal respiratory effort HEART: regular rate & rhythm ABDOMEN:  normoactive bowel sounds , non tender, not distended. Extremity: no pedal edema PSYCH: alert & oriented x 3 with fluent speech NEURO: no focal motor/sensory deficits  LABORATORY DATA:  I have reviewed the data as listed CBC Latest Ref Rng & Units 05/22/2021 04/16/2021  WBC 4.0 - 10.5 K/uL 14.1(H) 7.4  Hemoglobin 13.0 - 17.0 g/dL 13.0 13.0  Hematocrit 39.0 - 52.0 % 38.0(L) 37.3(L)  Platelets 150 -  400 K/uL 230 174    CMP Latest Ref Rng & Units 05/22/2021 04/16/2021  Glucose 70 - 99 mg/dL 145(H) 130(H)  BUN 6 - 20 mg/dL 13 9  Creatinine 0.61 - 1.24 mg/dL 0.72 0.73  Sodium 135 - 145 mmol/L 141 141  Potassium 3.5 - 5.1 mmol/L 4.0 3.5  Chloride 98 - 111 mmol/L 109 112(H)  CO2 22 - 32 mmol/L 23 21(L)  Calcium 8.9 - 10.3 mg/dL 9.3 9.5  Total Protein 6.5 - 8.1 g/dL 7.3 7.2  Total Bilirubin 0.3 - 1.2 mg/dL 1.5(H) 1.7(H)  Alkaline Phos 38 - 126 U/L 117 102  AST 15 - 41 U/L 11(L) 19  ALT 0 - 44 U/L 11 16   . Lab Results  Component Value Date   IRON 36 (L) 05/22/2021   TIBC 336 05/22/2021   IRONPCTSAT 11 (L) 05/22/2021   (Iron and TIBC)  Lab Results  Component Value Date   FERRITIN 111 05/22/2021     SURGICAL PATHOLOGY   THIS IS AN ADDENDUM REPORT   CASE: [id number]  PATIENT: [facility]  Surgical Pathology Report  Addendum    Reason for  Addendum #1:  DNA Mismatch Repair IHC Results   Clinical History: Fournier's Gangrene; Cancer ? ([facility])      FINAL MICROSCOPIC DIAGNOSIS:   A. IRRIGATION AND DEBRIDEMENT OF SCROTUM AND PENIS:  Acute necrotizing inflammation extensively involving fascia    B. Rectal wall biopsies:  Adenocarcinoma of the rectum        RADIOGRAPHIC STUDIES: I have personally reviewed the radiological images as listed and agreed with the findings in the report. No results found.   ASSESSMENT & PLAN:   Brenin Heidelberger is a 51 y.o. male with   1.  Oligometastatic rectal cancer with 2 visible liver metastases-newly noted on CT chest abdomen pelvis. KRAS mutated MMR IHC normal  2.  Initially diagnosed locally Advanced Rectal Adenocarcinoma, c(T3c, N2), mismatch repair protein normal -presented to ED on 11/29/20 with septic shock secondary to Fournier's gangrene -CT AP showed findings suspicious for necrotizing perineal soft tissue infection with irregular presacral nodule. Rectal biopsies obtained on 11/30/20 confirmed rectal adenocarcinoma. -Baseline CEA on 12/09/20 was elevated at 16.7. -He underwent diagnostic laparoscopy and open descending loop colostomy on 12/13/20 with Dr. Harlow Asa. -Chest CT on 12/14/20 was negative for metastatic disease. -Repeat CEA on 01/06/21 showed further elevation to 52.55. -staging pelvis MRI on 01/12/21 showed: rectal adenocarcinoma T stage: At least T3c, possible involvement of prostatic apex; N stage: N2; long segment rectal tumor, involving anal sphincters. -Given locally advanced disease,he was recommended neoadjuvant Xeloda/Oxaliplatin (CAPOX)  3.  Iron deficiency anemia now resolved . Lab Results  Component Value Date   IRON 36 (L) 05/22/2021   TIBC 336 05/22/2021   IRONPCTSAT 11 (L) 05/22/2021   (Iron and TIBC)  Lab Results  Component Value Date   FERRITIN 111 05/22/2021   4. Rectal pain- nearly resolved -tramadol prn 5insomnia -lorazepam prn  PLAN -Labs  reviewed with the patient in detail.  CBC and CMP stable. -Patient has no contraindications from proceeding with cycle 1 day 1 of modified FOLFIRINOX -We will see the patient in 2 weeks with cycle 2 of FOLFIRINOX and for toxicity check and repeat labs. -Patient has no symptom suggestive of rectal cancer progression at this time and has been gaining good p.o. intake. -He has completed her surgical follow-up and was recommended to proceed with additional chemotherapy prior to any surgical considerations. -He had his Port-A-Cath placed without  any issues -His supportive medications and antinausea medications were refilled. -Chemotherapy orders were reviewed and signed  Follow-up -Please schedule patient for cycle 2 of mFOLFIRINOX with port flush labs and MD visit Please schedule patient for cycle 3 of mFOLFIRINOX with port flush labs and MD visit  . The total time spent in the appointment was 32 minutes reviewing labs and foundation 1 results, ordering and management of new chemotherapy and supportive therapies, coordination of care with other specialties namely radiation oncology and surgical oncology.   Sullivan Lone MD MS Hematology/Oncology Physician [facility].

## 2021-06-10 ENCOUNTER — Telehealth: Payer: Self-pay | Admitting: Hematology

## 2021-06-10 NOTE — Telephone Encounter (Signed)
Left message with follow-up appointments per 12/29 los.

## 2021-06-11 ENCOUNTER — Encounter: Payer: Self-pay | Admitting: Hematology

## 2021-06-11 ENCOUNTER — Inpatient Hospital Stay: Payer: Medicaid Other

## 2021-06-11 NOTE — Progress Notes (Addendum)
[place]   Telephone:(336) [phone] Fax:(336) [phone]   Clinic Follow up Note   Patient Care Team: [name], MD as PCP - General (Hematology) [name], MD as Consulting Physician (Urology) [name], MD as Consulting Physician (Gastroenterology) [name], MD as Consulting Physician (Colon and Rectal Surgery) [name], MD as Consulting Physician (Oncology) Royston Bake, RN as Oncology Nurse Navigator (Oncology)  Date of Service:  ..06/05/2021  CHIEF COMPLAINT:  Follow-up for continued management of metastatic rectal adenocarcinoma prior to cycle 2 of FOLFIRINOX chemotherapy. Perianal abscess.  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[place])   01/13/2021 Cancer Staging   Staging form: Colon and Rectum, AJCC 8th Edition - Clinical stage from 01/13/2021: Stage IIIB (cT3, cN2a, cM0) - Signed by [name], MD on 01/19/2021 Stage prefix: Initial diagnosis Histologic grade (G): G2 Histologic grading system: 4 grade system    01/20/2021 - 03/26/2021 Chemotherapy   Patient is on Treatment Plan : RECTAL Xelox (Capeox) q21d x 6 cycles     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  He notes no acute toxicities from his cycle 1 of FOLFIRINOX. Grade 1 fatigue. Does have some mild neutropenia. Did not develop a small 2 to 3 cm induration near his anal area suggestive of perianal abscess which is currently draining and has  somewhat improved but still needs antibiotic coverage.  Was given a prescription for Augmentin. We discussed and added Granix to his treatment plan to avoid worsening neutropenia with his treatment. His daughter who is a Presenter, broadcasting was on the phone to help with interpretation and also to help him maintain compliance with his treatment plan and follow-up and interpret instructions. He notes no fevers chills night sweats. No abdominal pain. No new tingling or numbness in his upper or lower extremities.  MEDICAL HISTORY:  Past Medical History:  Diagnosis Date   Cancer ([place])    Tobacco abuse     SURGICAL HISTORY: Past Surgical History:  Procedure Laterality Date   HERNIA REPAIR     INCISION AND DRAINAGE ABSCESS N/A 12/01/2020   Procedure: INCISION AND DRAINAGE ABSCESS;  Surgeon: Michael Boston, MD;  Location: [facility];  Service: General;  Laterality: N/A;   INCISION AND DRAINAGE ABSCESS N/A 12/04/2020   Procedure: INCISION AND DRAINAGE FOURNIERS GANGRENE;  Surgeon: [name], MD;  Location: [facility];  Service: General;  Laterality: N/A;   INCISION AND DRAINAGE PERIRECTAL ABSCESS N/A 11/30/2020   Procedure: INCISION AND DEBRIDEMENT OF PERIRECTAL ABSCESS AND RECTAL WALL BIOPSIES;  Surgeon: Michael Boston, MD;  Location: [facility];  Service: General;  Laterality: N/A;   INGUINAL HERNIA REPAIR Right 2007   IR IMAGING GUIDED PORT INSERTION  05/07/2021   LAPAROSCOPIC LOOP COLOSTOMY N/A 12/13/2020   Procedure: DIAGNOSTIC LAPAROSCOPY WITH  OPEN DESCENDING LOOP COLOSTOMY;  Surgeon: Armandina Gemma, MD;  Location: [facility];  Service: General;  Laterality: N/A;   RECTAL SURGERY      I have reviewed the social history  and family history with the patient and they are unchanged from previous note.  ALLERGIES:  is allergic to hydrocodone.  ROS .10 Point review of Systems was done is negative except as noted above.   MEDICATIONS:  Current Outpatient Medications  Medication Sig Dispense Refill    amoxicillin-clavulanate (AUGMENTIN) 875-125 MG tablet Take 1 tablet by mouth 2 (two) times daily for 14 days. 28 tablet 0   acetaminophen (TYLENOL) 500 MG tablet Take 2 tablets (1,000 mg total) by mouth every 8 (eight) hours as needed for mild pain or fever.     bacitracin ointment Apply topically daily. Apply to lateral flank wounds 120 g 0   dexamethasone (DECADRON) 4 MG tablet Take 2 tablets (8 mg total) by mouth daily. Start the day after chemotherapy for 3 days. Take with food. 8 tablet 5   ferrous sulfate 325 (65 FE) MG tablet Take 1 tablet (325 mg total) by mouth daily with breakfast. 30 tablet 0   ibuprofen (ADVIL) 600 MG tablet Take 1 tablet (600 mg total) by mouth 3 (three) times daily. 30 tablet 0   lidocaine-prilocaine (EMLA) cream Apply to affected area once 30 g 3   loperamide (IMODIUM) 2 MG capsule Take 2 tablets by mouth at onset of diarrhea, then 1 tab every 2hrs until 12hr without a BM. May take 2 tab every 4hrs at bedtime. If diarrhea recurs repeat. 30 capsule 1   LORazepam (ATIVAN) 0.5 MG tablet Take 1 tablet by mouth every 6 hours as needed for anxiety. 30 tablet 0   methocarbamol (ROBAXIN) 500 MG tablet Take 1 tablet (500 mg total) by mouth every 8 (eight) hours as needed for muscle spasms. 30 tablet 0   ondansetron (ZOFRAN) 8 MG tablet Take 1 tablet (8 mg total) by mouth 2 (two) times daily as needed. Start on day 3 after chemotherapy. 30 tablet 1   potassium chloride SA (KLOR-CON) 20 MEQ tablet Take 1 tablet (20 mEq total) by mouth daily for 14 days. 14 tablet 0   prochlorperazine (COMPAZINE) 10 MG tablet Take 1 tablet (10 mg total) by mouth every 6 (six) hours as needed (Nausea or vomiting). 30 tablet 1   traMADol (ULTRAM) 50 MG tablet Take 1 tablet by mouth every 8 hours as needed. 30 tablet 0   No current facility-administered medications for this visit.   Facility-Administered Medications Ordered in Other Visits  Medication Dose Route Frequency Provider Last Rate Last  Admin   palonosetron (ALOXI) 0.25 MG/5ML injection            sodium chloride flush (NS) 0.9 % injection 10 mL  10 mL Intracatheter Once [name], MD        PHYSICAL EXAMINATION: ECOG PERFORMANCE STATUS: 1 - Symptomatic but completely ambulatory  Vitals:   06/05/21 1035  BP: 116/77  Pulse: 75  Resp: 16  Temp: 97.9 F (36.6 C)  SpO2: 100%    Wt Readings from Last 3 Encounters:  06/05/21 175 lb 12.8 oz (79.7 kg)  05/22/21 173 lb (78.5 kg)  04/24/21 167 lb 6.4 oz (75.9 kg)  . [name] GENERAL:alert, in no acute distress and comfortable SKIN: no acute rashes, no significant lesions EYES: conjunctiva are pink and non-injected, sclera anicteric OROPHARYNX: MMM, no exudates, no oropharyngeal erythema or ulceration NECK: supple, no JVD LYMPH:  no palpable lymphadenopathy in the cervical, axillary or inguinal regions LUNGS: clear to auscultation b/l with normal respiratory effort HEART: regular rate & rhythm ABDOMEN:  normoactive bowel sounds , non  tender, not distended. Extremity: no pedal edema PSYCH: alert & oriented x 3 with fluent speech NEURO: no focal motor/sensory deficits   LABORATORY DATA:  . CBC Latest Ref Rng & Units 06/05/2021 05/22/2021 04/16/2021  WBC 4.0 - 10.5 K/uL 4.2 14.1(H) 7.4  Hemoglobin 13.0 - 17.0 g/dL 12.3(L) 13.0 13.0  Hematocrit 39.0 - 52.0 % 36.8(L) 38.0(L) 37.3(L)  Platelets 150 - 400 K/uL 213 230 174   . CMP Latest Ref Rng & Units 06/05/2021 05/22/2021 04/16/2021  Glucose 70 - 99 mg/dL 125(H) 145(H) 130(H)  BUN 6 - 20 mg/dL '13 13 9  ' Creatinine 0.61 - 1.24 mg/dL 0.73 0.72 0.73  Sodium 135 - 145 mmol/L 139 141 141  Potassium 3.5 - 5.1 mmol/L 3.9 4.0 3.5  Chloride 98 - 111 mmol/L 110 109 112(H)  CO2 22 - 32 mmol/L 24 23 21(L)  Calcium 8.9 - 10.3 mg/dL 9.1 9.3 9.5  Total Protein 6.5 - 8.1 g/dL 6.9 7.3 7.2  Total Bilirubin 0.3 - 1.2 mg/dL 0.4 1.5(H) 1.7(H)  Alkaline Phos 38 - 126 U/L 105 117 102  AST 15 - 41 U/L 12(L) 11(L) 19  ALT 0 - 44  U/L '18 11 16    ' Lab Results  Component Value Date   IRON 36 (L) 05/22/2021   TIBC 336 05/22/2021   IRONPCTSAT 11 (L) 05/22/2021   (Iron and TIBC)  Lab Results  Component Value Date   FERRITIN 111 05/22/2021     SURGICAL PATHOLOGY   THIS IS AN ADDENDUM REPORT   CASE: [id number]  PATIENT: [facility]  Surgical Pathology Report  Addendum    Reason for Addendum #1:  DNA Mismatch Repair IHC Results   Clinical History: Fournier's Gangrene; Cancer ? ([facility])      FINAL MICROSCOPIC DIAGNOSIS:   A. IRRIGATION AND DEBRIDEMENT OF SCROTUM AND PENIS:  Acute necrotizing inflammation extensively involving fascia    B. Rectal wall biopsies:  Adenocarcinoma of the rectum        RADIOGRAPHIC STUDIES: I have personally reviewed the radiological images as listed and agreed with the findings in the report. No results found.   ASSESSMENT & PLAN:   James Coffey is a 51 y.o. male with   1.  Oligometastatic rectal cancer with 2 visible liver metastases-newly noted on CT chest abdomen pelvis. KRAS mutated MMR IHC normal  2.  Initially diagnosed locally Advanced Rectal Adenocarcinoma, c(T3c, N2), mismatch repair protein normal -presented to ED on 11/29/20 with septic shock secondary to Fournier's gangrene -CT AP showed findings suspicious for necrotizing perineal soft tissue infection with irregular presacral nodule. Rectal biopsies obtained on 11/30/20 confirmed rectal adenocarcinoma. -Baseline CEA on 12/09/20 was elevated at 16.7. -He underwent diagnostic laparoscopy and open descending loop colostomy on 12/13/20 with Dr. Harlow Asa. -Chest CT on 12/14/20 was negative for metastatic disease. -Repeat CEA on 01/06/21 showed further elevation to 52.55. -staging pelvis MRI on 01/12/21 showed: rectal adenocarcinoma T stage: At least T3c, possible involvement of prostatic apex; N stage: N2; long segment rectal tumor, involving anal sphincters. -Given locally advanced disease,he was recommended  neoadjuvant Xeloda/Oxaliplatin (CAPOX)  3.  Iron deficiency anemia now resolved . Lab Results  Component Value Date   IRON 36 (L) 05/22/2021   TIBC 336 05/22/2021   IRONPCTSAT 11 (L) 05/22/2021   (Iron and TIBC)  Lab Results  Component Value Date   FERRITIN 111 05/22/2021   4. Rectal pain- nearly resolved -tramadol prn 5insomnia -lorazepam prn  6. PERIANAL ABSCESS -draining spontaneously.  PLAN -Labs were  reviewed with the patient in details total WBC count within normal limits at 4k has mild neutropenia with ANC of 1000.  CBC and platelets within normal limits.  CMP stable -Grade 1 fatigue from chemotherapy.  Over prohibitive toxicities.  No significant neuropathy. -Started the patient on Augmentin for his perianal abscess and recommended using topical triple antibiotic ointment with lidocaine and using sterile dressing. -We will add Granix with his second cycle of FOLFIRINOX to limit his neutropenia especially in the context of perianal abscess. -He was recommended to call us as soon as possible if he develops fevers or worsening discomfort or drainage from his perianal abscess. -Patient has no symptom suggestive of rectal cancer progression at this time and has been gaining good p.o. intake. -Okay to proceed with cycle 2 of FOLFIRINOX at same doses with Granix support for 4 days with each chemotherapy. -His supportive medications and antinausea medications were refilled. -Chemotherapy orders were reviewed and signed -We shall plan to repeat his imaging after his fourth cycle of treatment unless needed previously.  Follow-up Please 4 days of Granix after cycle 2-day 3 for this cycle and subsequent cycles. Please schedule cycle 3 as per orders with port flush labs and MD visit on day 1 Please schedule cycle 4 as per orders with port flush labs and MD visit on day 1  . The total time spent in the appointment was 30 minutes reviewing labs, chemotherapy toxicity check,  evaluation and management of perianal abscess, adjusting chemotherapy to add growth factor support and coordination of care.  Sullivan Lone MD MS Hematology/Oncology Physician [facility]

## 2021-06-12 ENCOUNTER — Inpatient Hospital Stay: Payer: Medicaid Other | Attending: Hematology

## 2021-06-12 ENCOUNTER — Other Ambulatory Visit: Payer: Self-pay

## 2021-06-12 VITALS — BP 132/76 | HR 76 | Temp 98.3°F | Resp 16

## 2021-06-12 DIAGNOSIS — C787 Secondary malignant neoplasm of liver and intrahepatic bile duct: Secondary | ICD-10-CM | POA: Insufficient documentation

## 2021-06-12 DIAGNOSIS — Z5189 Encounter for other specified aftercare: Secondary | ICD-10-CM | POA: Insufficient documentation

## 2021-06-12 DIAGNOSIS — Z5111 Encounter for antineoplastic chemotherapy: Secondary | ICD-10-CM | POA: Insufficient documentation

## 2021-06-12 DIAGNOSIS — D701 Agranulocytosis secondary to cancer chemotherapy: Secondary | ICD-10-CM | POA: Insufficient documentation

## 2021-06-12 DIAGNOSIS — C20 Malignant neoplasm of rectum: Secondary | ICD-10-CM | POA: Diagnosis present

## 2021-06-12 DIAGNOSIS — C775 Secondary and unspecified malignant neoplasm of intrapelvic lymph nodes: Secondary | ICD-10-CM | POA: Diagnosis not present

## 2021-06-12 DIAGNOSIS — Z7189 Other specified counseling: Secondary | ICD-10-CM

## 2021-06-12 MED ORDER — FILGRASTIM-SNDZ 480 MCG/0.8ML IJ SOSY
480.0000 ug | PREFILLED_SYRINGE | Freq: Once | INTRAMUSCULAR | Status: AC
  Administered 2021-06-12: 480 ug via SUBCUTANEOUS
  Filled 2021-06-12: qty 0.8

## 2021-06-13 ENCOUNTER — Other Ambulatory Visit: Payer: Self-pay

## 2021-06-13 ENCOUNTER — Inpatient Hospital Stay: Payer: Medicaid Other

## 2021-06-13 VITALS — BP 122/82 | HR 85 | Temp 97.6°F | Resp 18

## 2021-06-13 DIAGNOSIS — Z5111 Encounter for antineoplastic chemotherapy: Secondary | ICD-10-CM | POA: Diagnosis not present

## 2021-06-13 DIAGNOSIS — Z7189 Other specified counseling: Secondary | ICD-10-CM

## 2021-06-13 DIAGNOSIS — C20 Malignant neoplasm of rectum: Secondary | ICD-10-CM

## 2021-06-13 MED ORDER — FILGRASTIM-SNDZ 480 MCG/0.8ML IJ SOSY
480.0000 ug | PREFILLED_SYRINGE | Freq: Once | INTRAMUSCULAR | Status: AC
  Administered 2021-06-13: 480 ug via SUBCUTANEOUS
  Filled 2021-06-13: qty 0.8

## 2021-06-14 ENCOUNTER — Inpatient Hospital Stay: Payer: Medicaid Other

## 2021-06-14 VITALS — BP 129/86 | HR 85 | Temp 97.7°F | Resp 18

## 2021-06-14 DIAGNOSIS — C787 Secondary malignant neoplasm of liver and intrahepatic bile duct: Secondary | ICD-10-CM

## 2021-06-14 DIAGNOSIS — Z5111 Encounter for antineoplastic chemotherapy: Secondary | ICD-10-CM | POA: Diagnosis not present

## 2021-06-14 DIAGNOSIS — Z7189 Other specified counseling: Secondary | ICD-10-CM

## 2021-06-14 DIAGNOSIS — C20 Malignant neoplasm of rectum: Secondary | ICD-10-CM

## 2021-06-14 MED ORDER — FILGRASTIM-SNDZ 480 MCG/0.8ML IJ SOSY
480.0000 ug | PREFILLED_SYRINGE | Freq: Once | INTRAMUSCULAR | Status: AC
  Administered 2021-06-14: 480 ug via SUBCUTANEOUS
  Filled 2021-06-14: qty 0.8

## 2021-06-14 NOTE — Patient Instructions (Signed)
Filgrastim, G-CSF injection °What is this medication? °FILGRASTIM, G-CSF (fil GRA stim) is a granulocyte colony-stimulating factor that stimulates the growth of neutrophils, a type of white blood cell (WBC) important in the body's fight against infection. It is used to reduce the incidence of fever and infection in patients with certain types of cancer who are receiving chemotherapy that affects the bone marrow, to stimulate blood cell production for removal of WBCs from the body prior to a bone marrow transplantation, to reduce the incidence of fever and infection in patients who have severe chronic neutropenia, and to improve survival outcomes following high-dose radiation exposure that is toxic to the bone marrow. °This medicine may be used for other purposes; ask your health care provider or pharmacist if you have questions. °COMMON BRAND NAME(S): Neupogen, Nivestym, Releuko, Zarxio °What should I tell my care team before I take this medication? °They need to know if you have any of these conditions: °kidney disease °latex allergy °ongoing radiation therapy °sickle cell disease °an unusual or allergic reaction to filgrastim, pegfilgrastim, other medicines, foods, dyes, or preservatives °pregnant or trying to get pregnant °breast-feeding °How should I use this medication? °This medicine is for injection under the skin or infusion into a vein. As an infusion into a vein, it is usually given by a health care professional in a hospital or clinic setting. If you get this medicine at home, you will be taught how to prepare and give this medicine. Refer to the Instructions for Use that come with your medication packaging. Use exactly as directed. Take your medicine at regular intervals. Do not take your medicine more often than directed. °It is important that you put your used needles and syringes in a special sharps container. Do not put them in a trash can. If you do not have a sharps container, call your pharmacist  or healthcare provider to get one. °Talk to your pediatrician regarding the use of this medicine in children. While this drug may be prescribed for children as young as 7 months for selected conditions, precautions do apply. °Overdosage: If you think you have taken too much of this medicine contact a poison control center or emergency room at once. °NOTE: This medicine is only for you. Do not share this medicine with others. °What if I miss a dose? °It is important not to miss your dose. Call your doctor or health care professional if you miss a dose. °What may interact with this medication? °This medicine may interact with the following medications: °medicines that may cause a release of neutrophils, such as lithium °This list may not describe all possible interactions. Give your health care provider a list of all the medicines, herbs, non-prescription drugs, or dietary supplements you use. Also tell them if you smoke, drink alcohol, or use illegal drugs. Some items may interact with your medicine. °What should I watch for while using this medication? °Your condition will be monitored carefully while you are receiving this medicine. °You may need blood work done while you are taking this medicine. °Talk to your health care provider about your risk of cancer. You may be more at risk for certain types of cancer if you take this medicine. °What side effects may I notice from receiving this medication? °Side effects that you should report to your doctor or health care professional as soon as possible: °allergic reactions like skin rash, itching or hives, swelling of the face, lips, or tongue °back pain °dizziness or feeling faint °fever °pain, redness, or   irritation at site where injected °pinpoint red spots on the skin °shortness of breath or breathing problems °signs and symptoms of kidney injury like trouble passing urine, change in the amount of urine, or red or dark-brown urine °stomach or side pain, or pain at  the shoulder °swelling °tiredness °unusual bleeding or bruising °Side effects that usually do not require medical attention (report to your doctor or health care professional if they continue or are bothersome): °bone pain °cough °diarrhea °hair loss °headache °muscle pain °This list may not describe all possible side effects. Call your doctor for medical advice about side effects. You may report side effects to FDA at 1-800-FDA-1088. °Where should I keep my medication? °Keep out of the reach of children. °Store in a refrigerator between 2 and 8 degrees C (36 and 46 degrees F). Do not freeze. Keep in carton to protect from light. Throw away this medicine if vials or syringes are left out of the refrigerator for more than 24 hours. Throw away any unused medicine after the expiration date. °NOTE: This sheet is a summary. It may not cover all possible information. If you have questions about this medicine, talk to your doctor, pharmacist, or health care provider. °© 2022 Elsevier/Gold Standard (2021-02-11 00:00:00) ° °

## 2021-06-15 NOTE — Addendum Note (Signed)
Addended by: Sullivan Lone on: 06/15/2021 04:55 PM   Modules accepted: Orders

## 2021-06-16 ENCOUNTER — Other Ambulatory Visit: Payer: Self-pay

## 2021-06-16 ENCOUNTER — Inpatient Hospital Stay: Payer: Medicaid Other

## 2021-06-16 VITALS — BP 123/70 | HR 60 | Temp 97.8°F | Resp 18

## 2021-06-16 DIAGNOSIS — C20 Malignant neoplasm of rectum: Secondary | ICD-10-CM

## 2021-06-16 DIAGNOSIS — Z7189 Other specified counseling: Secondary | ICD-10-CM

## 2021-06-16 DIAGNOSIS — Z5111 Encounter for antineoplastic chemotherapy: Secondary | ICD-10-CM | POA: Diagnosis not present

## 2021-06-16 MED ORDER — FILGRASTIM-SNDZ 480 MCG/0.8ML IJ SOSY
480.0000 ug | PREFILLED_SYRINGE | Freq: Once | INTRAMUSCULAR | Status: AC
  Administered 2021-06-16: 480 ug via SUBCUTANEOUS
  Filled 2021-06-16: qty 0.8

## 2021-06-18 ENCOUNTER — Other Ambulatory Visit: Payer: Self-pay

## 2021-06-18 DIAGNOSIS — C787 Secondary malignant neoplasm of liver and intrahepatic bile duct: Secondary | ICD-10-CM

## 2021-06-18 DIAGNOSIS — C20 Malignant neoplasm of rectum: Secondary | ICD-10-CM

## 2021-06-19 ENCOUNTER — Inpatient Hospital Stay: Payer: Medicaid Other

## 2021-06-19 ENCOUNTER — Other Ambulatory Visit (HOSPITAL_COMMUNITY): Payer: Self-pay

## 2021-06-19 ENCOUNTER — Inpatient Hospital Stay (HOSPITAL_BASED_OUTPATIENT_CLINIC_OR_DEPARTMENT_OTHER): Payer: Medicaid Other | Admitting: Hematology

## 2021-06-19 ENCOUNTER — Encounter: Payer: Self-pay | Admitting: Hematology

## 2021-06-19 ENCOUNTER — Other Ambulatory Visit: Payer: Self-pay

## 2021-06-19 VITALS — BP 112/76 | HR 67 | Temp 98.3°F | Resp 18 | Wt 176.0 lb

## 2021-06-19 DIAGNOSIS — Z5111 Encounter for antineoplastic chemotherapy: Secondary | ICD-10-CM

## 2021-06-19 DIAGNOSIS — C775 Secondary and unspecified malignant neoplasm of intrapelvic lymph nodes: Secondary | ICD-10-CM

## 2021-06-19 DIAGNOSIS — K610 Anal abscess: Secondary | ICD-10-CM

## 2021-06-19 DIAGNOSIS — C20 Malignant neoplasm of rectum: Secondary | ICD-10-CM

## 2021-06-19 DIAGNOSIS — C787 Secondary malignant neoplasm of liver and intrahepatic bile duct: Secondary | ICD-10-CM

## 2021-06-19 DIAGNOSIS — Z95828 Presence of other vascular implants and grafts: Secondary | ICD-10-CM

## 2021-06-19 DIAGNOSIS — Z7189 Other specified counseling: Secondary | ICD-10-CM

## 2021-06-19 LAB — CMP (CANCER CENTER ONLY)
ALT: 22 U/L (ref 0–44)
AST: 15 U/L (ref 15–41)
Albumin: 3.9 g/dL (ref 3.5–5.0)
Alkaline Phosphatase: 122 U/L (ref 38–126)
Anion gap: 6 (ref 5–15)
BUN: 9 mg/dL (ref 6–20)
CO2: 25 mmol/L (ref 22–32)
Calcium: 9.2 mg/dL (ref 8.9–10.3)
Chloride: 110 mmol/L (ref 98–111)
Creatinine: 0.7 mg/dL (ref 0.61–1.24)
GFR, Estimated: >60 mL/min (ref >60)
Glucose, Bld: 134 mg/dL — ABNORMAL HIGH (ref 70–99)
Potassium: 3.5 mmol/L (ref 3.5–5.1)
Sodium: 141 mmol/L (ref 135–145)
Total Bilirubin: 0.4 mg/dL (ref 0.3–1.2)
Total Protein: 6.8 g/dL (ref 6.5–8.1)

## 2021-06-19 LAB — FERRITIN: Ferritin: 238 ng/mL (ref 24–336)

## 2021-06-19 LAB — IRON AND IRON BINDING CAPACITY (CC-WL,HP ONLY)
Iron: 153 ug/dL (ref 45–182)
Saturation Ratios: 41 % — ABNORMAL HIGH (ref 17.9–39.5)
TIBC: 374 ug/dL (ref 250–450)
UIBC: 221 ug/dL (ref 117–376)

## 2021-06-19 LAB — CBC WITH DIFFERENTIAL (CANCER CENTER ONLY)
Abs Immature Granulocytes: 0.66 10*3/uL — ABNORMAL HIGH (ref 0.00–0.07)
Basophils Absolute: 0.1 10*3/uL (ref 0.0–0.1)
Basophils Relative: 1 %
Eosinophils Absolute: 0.1 10*3/uL (ref 0.0–0.5)
Eosinophils Relative: 1 %
HCT: 38.1 % — ABNORMAL LOW (ref 39.0–52.0)
Hemoglobin: 12.9 g/dL — ABNORMAL LOW (ref 13.0–17.0)
Immature Granulocytes: 9 %
Lymphocytes Relative: 34 %
Lymphs Abs: 2.6 10*3/uL (ref 0.7–4.0)
MCH: 31.8 pg (ref 26.0–34.0)
MCHC: 33.9 g/dL (ref 30.0–36.0)
MCV: 93.8 fL (ref 80.0–100.0)
Monocytes Absolute: 1 10*3/uL (ref 0.1–1.0)
Monocytes Relative: 13 %
Neutro Abs: 3.3 10*3/uL (ref 1.7–7.7)
Neutrophils Relative %: 42 %
Platelet Count: 108 10*3/uL — ABNORMAL LOW (ref 150–400)
RBC: 4.06 MIL/uL — ABNORMAL LOW (ref 4.22–5.81)
RDW: 14 % (ref 11.5–15.5)
WBC Count: 7.7 10*3/uL (ref 4.0–10.5)
nRBC: 0.3 % — ABNORMAL HIGH (ref 0.0–0.2)

## 2021-06-19 LAB — VITAMIN B12: Vitamin B-12: 663 pg/mL (ref 180–914)

## 2021-06-19 LAB — CEA (IN HOUSE-CHCC): CEA (CHCC-In House): 6.01 ng/mL — ABNORMAL HIGH (ref 0.00–5.00)

## 2021-06-19 MED ORDER — TRAMADOL HCL 50 MG PO TABS
50.0000 mg | ORAL_TABLET | Freq: Three times a day (TID) | ORAL | 0 refills | Status: DC | PRN
  Filled 2021-06-19: qty 30, 10d supply, fill #0

## 2021-06-19 MED ORDER — ONDANSETRON HCL 8 MG PO TABS
8.0000 mg | ORAL_TABLET | Freq: Two times a day (BID) | ORAL | 1 refills | Status: DC | PRN
  Filled 2021-06-19: qty 30, 15d supply, fill #0

## 2021-06-19 MED ORDER — SODIUM CHLORIDE 0.9 % IV SOLN
10.0000 mg | Freq: Once | INTRAVENOUS | Status: AC
  Administered 2021-06-19: 10 mg via INTRAVENOUS
  Filled 2021-06-19: qty 10

## 2021-06-19 MED ORDER — LORAZEPAM 0.5 MG PO TABS
0.5000 mg | ORAL_TABLET | Freq: Four times a day (QID) | ORAL | 0 refills | Status: DC | PRN
  Filled 2021-06-19: qty 30, 8d supply, fill #0

## 2021-06-19 MED ORDER — SODIUM CHLORIDE 0.9 % IV SOLN
2451.0000 mg/m2 | INTRAVENOUS | Status: DC
  Administered 2021-06-19: 5000 mg via INTRAVENOUS
  Filled 2021-06-19: qty 100

## 2021-06-19 MED ORDER — DEXTROSE 5 % IV SOLN: Freq: Once | INTRAVENOUS | Status: AC

## 2021-06-19 MED ORDER — SODIUM CHLORIDE 0.9 % IV SOLN
400.0000 mg/m2 | Freq: Once | INTRAVENOUS | Status: AC
  Administered 2021-06-19: 812 mg via INTRAVENOUS
  Filled 2021-06-19: qty 25

## 2021-06-19 MED ORDER — SODIUM CHLORIDE 0.9 % IV SOLN
150.0000 mg | Freq: Once | INTRAVENOUS | Status: AC
  Administered 2021-06-19: 150 mg via INTRAVENOUS
  Filled 2021-06-19: qty 150

## 2021-06-19 MED ORDER — ATROPINE SULFATE 1 MG/ML IV SOLN
0.5000 mg | Freq: Once | INTRAVENOUS | Status: AC | PRN
  Administered 2021-06-19: 0.5 mg via INTRAVENOUS
  Filled 2021-06-19: qty 1

## 2021-06-19 MED ORDER — AMOXICILLIN-POT CLAVULANATE 875-125 MG PO TABS
1.0000 | ORAL_TABLET | Freq: Two times a day (BID) | ORAL | 0 refills | Status: AC
  Filled 2021-06-19: qty 14, 7d supply, fill #0

## 2021-06-19 MED ORDER — SODIUM CHLORIDE 0.9% FLUSH
10.0000 mL | Freq: Once | INTRAVENOUS | Status: AC
  Administered 2021-06-19: 10 mL

## 2021-06-19 MED ORDER — PALONOSETRON HCL INJECTION 0.25 MG/5ML
0.2500 mg | Freq: Once | INTRAVENOUS | Status: AC
  Administered 2021-06-19: 0.25 mg via INTRAVENOUS
  Filled 2021-06-19: qty 5

## 2021-06-19 MED ORDER — OXALIPLATIN CHEMO INJECTION 100 MG/20ML
85.0000 mg/m2 | Freq: Once | INTRAVENOUS | Status: AC
  Administered 2021-06-19: 175 mg via INTRAVENOUS
  Filled 2021-06-19: qty 35

## 2021-06-19 MED ORDER — SODIUM CHLORIDE 0.9 % IV SOLN
150.0000 mg/m2 | Freq: Once | INTRAVENOUS | Status: AC
  Administered 2021-06-19: 300 mg via INTRAVENOUS
  Filled 2021-06-19: qty 15

## 2021-06-19 NOTE — Patient Instructions (Addendum)
James Coffey! ?????? ????????!  Discharge Instructions: Thank you for choosing [facility] to provide your oncology and hematology care.   If you have a lab appointment with the [facility], please go directly to the [facility] and check in at the registration area.   Wear comfortable clothing and clothing appropriate for easy access to any Portacath or PICC line.   We strive to give you quality time with your provider. You may need to reschedule your appointment if you arrive late (15 or more minutes).  Arriving late affects you and other patients whose appointments are after yours.  Also, if you miss three or more appointments without notifying the office, you may be dismissed from the clinic at the providers discretion.      For prescription refill requests, have your pharmacy contact our office and allow 72 hours for refills to be completed.    Today you received the following chemotherapy and/or immunotherapy agents: Oxaliplatin, Leucovorin, Irinotecan, and Fluorouracil (Adrucil)      To help prevent nausea and vomiting after your treatment, we encourage you to take your nausea medication as directed.  BELOW ARE SYMPTOMS THAT SHOULD BE REPORTED IMMEDIATELY: *FEVER GREATER THAN 100.4 F (38 C) OR HIGHER *CHILLS OR SWEATING *NAUSEA AND VOMITING THAT IS NOT CONTROLLED WITH YOUR NAUSEA MEDICATION *UNUSUAL SHORTNESS OF BREATH *UNUSUAL BRUISING OR BLEEDING *URINARY PROBLEMS (pain or burning when urinating, or frequent urination) *BOWEL PROBLEMS (unusual diarrhea, constipation, pain near the anus) TENDERNESS IN MOUTH AND THROAT WITH OR WITHOUT PRESENCE OF ULCERS (sore throat, sores in mouth, or a toothache) UNUSUAL RASH, SWELLING OR PAIN  UNUSUAL VAGINAL DISCHARGE OR ITCHING   Items with * indicate a potential emergency and should be followed up as soon as possible or go to the Emergency Department if any problems should  occur.  Please show the CHEMOTHERAPY ALERT CARD or IMMUNOTHERAPY ALERT CARD at check-in to the Emergency Department and triage nurse.  Should you have questions after your visit or need to cancel or reschedule your appointment, please contact [facility]  Dept: [phone]  and follow the prompts.  Office hours are 8:00 a.m. to 4:30 p.m. Monday - Friday. Please note that voicemails left after 4:00 p.m. may not be returned until the following business day.  We are closed weekends and major holidays. You have access to a nurse at all times for urgent questions. Please call the main number to the clinic Dept: [phone] and follow the prompts.   For any non-urgent questions, you may also contact your provider using MyChart. We now offer e-Visits for anyone 84 and older to request care online for non-urgent symptoms. For details visit [email or web address].   Also download the MyChart app! Go to the app store, search "MyChart", open the app, select [facility], and log in with your MyChart username and password.  Due to Covid, a mask is required upon entering the hospital/clinic. If you do not have a mask, one will be given to you upon arrival. For doctor visits, patients may have 1 support person aged 89 or older with them. For treatment visits, patients cannot have anyone with them due to current Covid guidelines and our immunocompromised population.   The chemotherapy medication bag should finish at 46 hours, 96 hours, or 7 days. For example, if your pump is scheduled for 46 hours and it was put on at 4:00 p.m., it should finish at 2:00 p.m.  the day it is scheduled to come off regardless of your appointment time.     Estimated time to finish at .   If the display on your pump reads "Low Volume" and it is beeping, take the batteries out of the pump and come to the cancer center for it to be taken off.   If the pump alarms go off prior to the pump reading "Low  Volume" then call [phone] and someone can assist you.  If the plunger comes out and the chemotherapy medication is leaking out, please use your home chemo spill kit to clean up the spill. Do NOT use paper towels or other household products.  If you have problems or questions regarding your pump, please call either [phone] (24 hours a day) or the cancer center Monday-Friday 8:00 a.m.- 4:30 p.m. at the clinic number and we will assist you. If you are unable to get assistance, then go to the nearest Emergency Department and ask the staff to contact the IV team for assistance.

## 2021-06-19 NOTE — Progress Notes (Signed)
Per Dr. Irene Limbo- okay to run fluorouracil pump over 45 hours. Rate adjusted to 5.6 ml/hr and confirmed with Dow Adolph, RN. Pump settings adjusted and pump d/c appointment made accordingly.

## 2021-06-20 ENCOUNTER — Other Ambulatory Visit (HOSPITAL_COMMUNITY): Payer: Self-pay

## 2021-06-21 ENCOUNTER — Inpatient Hospital Stay: Payer: Medicaid Other

## 2021-06-21 ENCOUNTER — Other Ambulatory Visit: Payer: Self-pay

## 2021-06-21 VITALS — BP 103/63 | HR 79 | Temp 97.7°F | Resp 19

## 2021-06-21 DIAGNOSIS — C787 Secondary malignant neoplasm of liver and intrahepatic bile duct: Secondary | ICD-10-CM

## 2021-06-21 DIAGNOSIS — Z7189 Other specified counseling: Secondary | ICD-10-CM

## 2021-06-21 DIAGNOSIS — Z5111 Encounter for antineoplastic chemotherapy: Secondary | ICD-10-CM | POA: Diagnosis not present

## 2021-06-21 MED ORDER — SODIUM CHLORIDE 0.9% FLUSH
10.0000 mL | INTRAVENOUS | Status: DC | PRN
  Administered 2021-06-21: 10 mL

## 2021-06-21 MED ORDER — HEPARIN SOD (PORK) LOCK FLUSH 100 UNIT/ML IV SOLN
500.0000 [IU] | Freq: Once | INTRAVENOUS | Status: AC | PRN
  Administered 2021-06-21: 500 [IU]

## 2021-06-23 ENCOUNTER — Other Ambulatory Visit: Payer: Self-pay

## 2021-06-23 ENCOUNTER — Inpatient Hospital Stay: Payer: Medicaid Other

## 2021-06-23 VITALS — BP 116/74 | HR 74 | Temp 97.8°F | Resp 20

## 2021-06-23 DIAGNOSIS — Z5111 Encounter for antineoplastic chemotherapy: Secondary | ICD-10-CM | POA: Diagnosis not present

## 2021-06-23 DIAGNOSIS — C20 Malignant neoplasm of rectum: Secondary | ICD-10-CM

## 2021-06-23 DIAGNOSIS — Z7189 Other specified counseling: Secondary | ICD-10-CM

## 2021-06-23 MED ORDER — FILGRASTIM-SNDZ 480 MCG/0.8ML IJ SOSY
480.0000 ug | PREFILLED_SYRINGE | Freq: Once | INTRAMUSCULAR | Status: AC
  Administered 2021-06-23: 480 ug via SUBCUTANEOUS
  Filled 2021-06-23: qty 0.8

## 2021-06-24 ENCOUNTER — Inpatient Hospital Stay: Payer: Medicaid Other

## 2021-06-24 VITALS — BP 120/78 | HR 76 | Temp 98.5°F | Resp 18

## 2021-06-24 DIAGNOSIS — Z5111 Encounter for antineoplastic chemotherapy: Secondary | ICD-10-CM | POA: Diagnosis not present

## 2021-06-24 DIAGNOSIS — Z7189 Other specified counseling: Secondary | ICD-10-CM

## 2021-06-24 DIAGNOSIS — C20 Malignant neoplasm of rectum: Secondary | ICD-10-CM

## 2021-06-24 DIAGNOSIS — C787 Secondary malignant neoplasm of liver and intrahepatic bile duct: Secondary | ICD-10-CM

## 2021-06-24 MED ORDER — FILGRASTIM-SNDZ 480 MCG/0.8ML IJ SOSY
480.0000 ug | PREFILLED_SYRINGE | Freq: Once | INTRAMUSCULAR | Status: AC
  Administered 2021-06-24: 480 ug via SUBCUTANEOUS
  Filled 2021-06-24: qty 0.8

## 2021-06-25 ENCOUNTER — Encounter: Payer: Self-pay | Admitting: Hematology

## 2021-06-25 ENCOUNTER — Inpatient Hospital Stay: Payer: Medicaid Other

## 2021-06-25 VITALS — BP 111/68 | HR 77 | Temp 98.1°F | Resp 17

## 2021-06-25 DIAGNOSIS — Z7189 Other specified counseling: Secondary | ICD-10-CM

## 2021-06-25 DIAGNOSIS — C20 Malignant neoplasm of rectum: Secondary | ICD-10-CM

## 2021-06-25 DIAGNOSIS — Z5111 Encounter for antineoplastic chemotherapy: Secondary | ICD-10-CM | POA: Diagnosis not present

## 2021-06-25 MED ORDER — FILGRASTIM-SNDZ 480 MCG/0.8ML IJ SOSY
480.0000 ug | PREFILLED_SYRINGE | Freq: Once | INTRAMUSCULAR | Status: AC
  Administered 2021-06-25: 480 ug via SUBCUTANEOUS
  Filled 2021-06-25: qty 0.8

## 2021-06-25 NOTE — Progress Notes (Addendum)
[facility]   [phone] [phone]   Clinic Follow up Note   Patient Care Team: [name], MD as PCP - General (Hematology) [name], MD as Consulting Physician (Urology) [name], MD as Consulting Physician (Gastroenterology) [name], MD as Consulting Physician (Colon and Rectal Surgery) [name], MD as Consulting Physician (Oncology) Royston Bake, RN as Oncology Nurse Navigator (Oncology)  Date of Service:  ..06/19/2021  CHIEF COMPLAINT:  Follow-up for continued management of metastatic rectal adenocarcinoma prior to third cycle of FOLFIRINOX chemotherapy. 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[place])   01/13/2021 Cancer Staging   Staging form: Colon and Rectum, AJCC 8th Edition - Clinical stage from 01/13/2021: Stage IIIB (cT3, cN2a, cM0) - Signed by [name], MD on 01/19/2021 Stage prefix: Initial diagnosis Histologic grade (G): G2 Histologic grading system: 4 grade system    01/20/2021 - 03/26/2021 Chemotherapy   Patient is on Treatment Plan : RECTAL Xelox ([place]ox) q21d x 6 cycles     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   He notes that his perianal abscess is nearly resolved. No fevers no chills no night sweats. No new abdominal pain. No new symptoms suggestive of neuropathy. Labs today CBC hemoglobin of 12.9, normal WBC count of 7.7k,  platelets of 108k. CMP within normal limits Iron saturation of 41% with a ferritin of 238. CEA 6.01  MEDICAL HISTORY:  Past Medical History:  Diagnosis Date   Cancer ([place])    Tobacco abuse     SURGICAL HISTORY: Past Surgical History:  Procedure Laterality Date   HERNIA REPAIR     INCISION AND DRAINAGE ABSCESS N/A 12/01/2020   Procedure: INCISION AND DRAINAGE ABSCESS;  Surgeon: Michael Boston, MD;  Location: [facility];  Service: General;  Laterality: N/A;   INCISION AND DRAINAGE ABSCESS N/A 12/04/2020   Procedure: INCISION AND DRAINAGE FOURNIERS GANGRENE;  Surgeon: [name], MD;  Location: [facility];  Service: General;  Laterality: N/A;   INCISION AND DRAINAGE PERIRECTAL ABSCESS N/A 11/30/2020   Procedure: INCISION AND DEBRIDEMENT OF PERIRECTAL ABSCESS AND RECTAL WALL BIOPSIES;  Surgeon: Michael Boston, MD;  Location: [facility];  Service: General;  Laterality: N/A;   INGUINAL HERNIA REPAIR Right 2007   IR IMAGING GUIDED PORT INSERTION  05/07/2021   LAPAROSCOPIC LOOP COLOSTOMY N/A 12/13/2020   Procedure: DIAGNOSTIC LAPAROSCOPY WITH  OPEN DESCENDING LOOP COLOSTOMY;  Surgeon: Armandina Gemma, MD;  Location: [facility];  Service: General;  Laterality: N/A;   RECTAL SURGERY      I have reviewed the social history and family history with the patient and they are unchanged from previous note.  ALLERGIES:  is allergic to hydrocodone.  ROS .10 Point review of Systems was done is negative except as noted above.   MEDICATIONS:  Current Outpatient Medications  Medication Sig Dispense Refill   acetaminophen (TYLENOL) 500 MG tablet Take 2 tablets (1,000 mg total) by mouth every 8 (eight) hours as  needed for mild pain or fever.     bacitracin ointment Apply topically daily. Apply to lateral flank wounds 120 g 0   dexamethasone (DECADRON) 4 MG tablet Take 2 tablets (8 mg total) by mouth daily. Start the day after chemotherapy for 3 days. Take with food. 8 tablet 5   ferrous sulfate 325 (65 FE) MG  tablet Take 1 tablet (325 mg total) by mouth daily with breakfast. 30 tablet 0   ibuprofen (ADVIL) 600 MG tablet Take 1 tablet (600 mg total) by mouth 3 (three) times daily. 30 tablet 0   lidocaine-prilocaine (EMLA) cream Apply to affected area once 30 g 3   loperamide (IMODIUM) 2 MG capsule Take 2 tablets by mouth at onset of diarrhea, then 1 tab every 2hrs until 12hr without a BM. May take 2 tab every 4hrs at bedtime. If diarrhea recurs repeat. 30 capsule 1   LORazepam (ATIVAN) 0.5 MG tablet Take 1 tablet by mouth every 6 hours as needed for anxiety. 30 tablet 0   methocarbamol (ROBAXIN) 500 MG tablet Take 1 tablet (500 mg total) by mouth every 8 (eight) hours as needed for muscle spasms. 30 tablet 0   ondansetron (ZOFRAN) 8 MG tablet Take 1 tablet (8 mg total) by mouth 2 (two) times daily as needed. Start on day 3 after chemotherapy. 30 tablet 1   potassium chloride SA (KLOR-CON) 20 MEQ tablet Take 1 tablet (20 mEq total) by mouth daily for 14 days. 14 tablet 0   prochlorperazine (COMPAZINE) 10 MG tablet Take 1 tablet (10 mg total) by mouth every 6 (six) hours as needed (Nausea or vomiting). 30 tablet 1   traMADol (ULTRAM) 50 MG tablet Take 1 tablet by mouth every 8 hours as needed. 30 tablet 0   No current facility-administered medications for this visit.   Facility-Administered Medications Ordered in Other Visits  Medication Dose Route Frequency Provider Last Rate Last Admin   palonosetron (ALOXI) 0.25 MG/5ML injection            sodium chloride flush (NS) 0.9 % injection 10 mL  10 mL Intracatheter Once [name], MD        PHYSICAL EXAMINATION: ECOG PERFORMANCE STATUS: 1 - Symptomatic but completely ambulatory  There were no vitals filed for this visit.   Wt Readings from Last 3 Encounters:  06/19/21 176 lb (79.8 kg)  06/05/21 175 lb 12.8 oz (79.7 kg)  05/22/21 173 lb (78.5 kg)  . [name] GENERAL:alert, in no acute distress and comfortable SKIN: no acute rashes, no  significant lesions EYES: conjunctiva are pink and non-injected, sclera anicteric OROPHARYNX: MMM, no exudates, no oropharyngeal erythema or ulceration NECK: supple, no JVD LYMPH:  no palpable lymphadenopathy in the cervical, axillary or inguinal regions LUNGS: clear to auscultation b/l with normal respiratory effort HEART: regular rate & rhythm ABDOMEN:  normoactive bowel sounds , non tender, not distended. Extremity: no pedal edema PSYCH: alert & oriented x 3 with fluent speech NEURO: no focal motor/sensory deficits   LABORATORY DATA:  . CBC Latest Ref Rng & Units 06/19/2021 06/05/2021 05/22/2021  WBC 4.0 - 10.5 K/uL 7.7 4.2 14.1(H)  Hemoglobin 13.0 - 17.0 g/dL 12.9(L) 12.3(L) 13.0  Hematocrit 39.0 - 52.0 % 38.1(L) 36.8(L) 38.0(L)  Platelets 150 - 400 K/uL 108(L) 213 230   . CMP Latest Ref Rng & Units 06/19/2021 06/05/2021 05/22/2021  Glucose 70 - 99 mg/dL 134(H) 125(H) 145(H)  BUN 6 - 20 mg/dL _0 Creatinine 0.61 - 1.24  mg/dL 0.70 0.73 0.72  Sodium 135 - 145 mmol/L 141 139 141  Potassium 3.5 - 5.1 mmol/L 3.5 3.9 4.0  Chloride 98 - 111 mmol/L 110 110 109  CO2 22 - 32 mmol/L _0 Calcium 8.9 - 10.3 mg/dL 9.2 9.1 9.3  Total Protein 6.5 - 8.1 g/dL 6.8 6.9 7.3  Total Bilirubin 0.3 - 1.2 mg/dL 0.4 0.4 1.5(H)  Alkaline Phos 38 - 126 U/L 122 105 117  AST 15 - 41 U/L 15 12(L) 11(L)  ALT 0 - 44 U/L _1 Lab Results  Component Value Date   IRON 153 06/19/2021   TIBC 374 06/19/2021   IRONPCTSAT 41 (H) 06/19/2021   (Iron and TIBC)  Lab Results  Component Value Date   FERRITIN 238 06/19/2021    SURGICAL PATHOLOGY   THIS IS AN ADDENDUM REPORT   CASE: [id number]  PATIENT: [facility]  Surgical Pathology Report  Addendum    Reason for Addendum #1:  DNA Mismatch Repair IHC Results   Clinical History: Fournier's Gangrene; Cancer ? ([facility])      FINAL MICROSCOPIC DIAGNOSIS:   A. IRRIGATION AND DEBRIDEMENT OF SCROTUM AND PENIS:  Acute necrotizing  inflammation extensively involving fascia    B. Rectal wall biopsies:  Adenocarcinoma of the rectum        RADIOGRAPHIC STUDIES: I have personally reviewed the radiological images as listed and agreed with the findings in the report. No results found.   ASSESSMENT & PLAN:   James Coffey is a 50 y.o. male with   1.  Oligometastatic rectal cancer with 2 visible liver metastases-newly noted on CT chest abdomen pelvis. KRAS mutated MMR IHC normal   2.  Initially diagnosed locally Advanced Rectal Adenocarcinoma, c(T3c, N2), mismatch repair protein normal -presented to ED on 11/29/20 with septic shock secondary to Fournier's gangrene -CT AP showed findings suspicious for necrotizing perineal soft tissue infection with irregular presacral nodule. Rectal biopsies obtained on 11/30/20 confirmed rectal adenocarcinoma. -Baseline CEA on 12/09/20 was elevated at 16.7. -He underwent diagnostic laparoscopy and open descending loop colostomy on 12/13/20 with Dr. Harlow Asa. -Chest CT on 12/14/20 was negative for metastatic disease. -Repeat CEA on 01/06/21 showed further elevation to 52.55. -staging pelvis MRI on 01/12/21 showed: rectal adenocarcinoma T stage: At least T3c, possible involvement of prostatic apex; N stage: N2; long segment rectal tumor, involving anal sphincters. -Given locally advanced disease,he was recommended neoadjuvant Xeloda/Oxaliplatin (CAPOX)  3.  Iron deficiency anemia now resolved . Lab Results  Component Value Date   IRON 153 06/19/2021   TIBC 374 06/19/2021   IRONPCTSAT 41 (H) 06/19/2021   (Iron and TIBC)  Lab Results  Component Value Date   FERRITIN 238 06/19/2021   4. Rectal pain- nearly resolved -tramadol prn 5insomnia -lorazepam prn  6. PERIANAL ABSCESS -draining spontaneously.  Nearly resolved with antibiotics.  PLAN -Labs are reviewed with the patient CBC and CMP are stable. CEA levels borderline elevated in the context of local infection.  We will need to  monitor -No significant toxicities from cycle 2 of FOLFIRINOX and the patient is appropriate to proceed with cycle 3 starting today. -Continue topical triple antibiotic dressings.  Use a donut pillow to keep pressure off the perianal ulcer. -Continue Granix to avoid neutropenia with his FOLFIRINOX. -Supportive medications were refilled -Chemotherapy orders reviewed and signed  Follow-up Follow-up for cycle 4 of FOLFIRINOX as scheduled.   Sullivan Lone MD MS Hematology/Oncology Physician [facility]

## 2021-06-26 ENCOUNTER — Inpatient Hospital Stay: Payer: Medicaid Other

## 2021-06-26 ENCOUNTER — Other Ambulatory Visit: Payer: Self-pay

## 2021-06-26 VITALS — BP 110/79 | HR 89 | Temp 97.8°F | Resp 18

## 2021-06-26 DIAGNOSIS — C20 Malignant neoplasm of rectum: Secondary | ICD-10-CM

## 2021-06-26 DIAGNOSIS — C787 Secondary malignant neoplasm of liver and intrahepatic bile duct: Secondary | ICD-10-CM

## 2021-06-26 DIAGNOSIS — Z5111 Encounter for antineoplastic chemotherapy: Secondary | ICD-10-CM | POA: Diagnosis not present

## 2021-06-26 DIAGNOSIS — Z7189 Other specified counseling: Secondary | ICD-10-CM

## 2021-06-26 MED ORDER — FILGRASTIM-SNDZ 480 MCG/0.8ML IJ SOSY
480.0000 ug | PREFILLED_SYRINGE | Freq: Once | INTRAMUSCULAR | Status: AC
  Administered 2021-06-26: 480 ug via SUBCUTANEOUS
  Filled 2021-06-26: qty 0.8

## 2021-07-01 ENCOUNTER — Encounter: Payer: Self-pay | Admitting: Hematology

## 2021-07-03 ENCOUNTER — Other Ambulatory Visit: Payer: Self-pay

## 2021-07-03 ENCOUNTER — Inpatient Hospital Stay: Payer: Medicaid Other

## 2021-07-03 ENCOUNTER — Encounter: Payer: Self-pay | Admitting: Hematology

## 2021-07-03 ENCOUNTER — Other Ambulatory Visit (HOSPITAL_COMMUNITY): Payer: Self-pay

## 2021-07-03 ENCOUNTER — Inpatient Hospital Stay (HOSPITAL_BASED_OUTPATIENT_CLINIC_OR_DEPARTMENT_OTHER): Payer: Medicaid Other | Admitting: Hematology

## 2021-07-03 VITALS — BP 104/63 | HR 72 | Temp 98.0°F | Resp 18 | Ht 70.5 in | Wt 176.8 lb

## 2021-07-03 DIAGNOSIS — C775 Secondary and unspecified malignant neoplasm of intrapelvic lymph nodes: Secondary | ICD-10-CM

## 2021-07-03 DIAGNOSIS — Z7189 Other specified counseling: Secondary | ICD-10-CM

## 2021-07-03 DIAGNOSIS — Z5111 Encounter for antineoplastic chemotherapy: Secondary | ICD-10-CM | POA: Diagnosis not present

## 2021-07-03 DIAGNOSIS — C787 Secondary malignant neoplasm of liver and intrahepatic bile duct: Secondary | ICD-10-CM

## 2021-07-03 DIAGNOSIS — C20 Malignant neoplasm of rectum: Secondary | ICD-10-CM

## 2021-07-03 DIAGNOSIS — Z95828 Presence of other vascular implants and grafts: Secondary | ICD-10-CM

## 2021-07-03 LAB — CBC WITH DIFFERENTIAL/PLATELET
Abs Immature Granulocytes: 0.05 10*3/uL (ref 0.00–0.07)
Basophils Absolute: 0.1 10*3/uL (ref 0.0–0.1)
Basophils Relative: 1 %
Eosinophils Absolute: 0.1 10*3/uL (ref 0.0–0.5)
Eosinophils Relative: 2 %
HCT: 36.9 % — ABNORMAL LOW (ref 39.0–52.0)
Hemoglobin: 12.3 g/dL — ABNORMAL LOW (ref 13.0–17.0)
Immature Granulocytes: 1 %
Lymphocytes Relative: 39 %
Lymphs Abs: 2.1 10*3/uL (ref 0.7–4.0)
MCH: 30.7 pg (ref 26.0–34.0)
MCHC: 33.3 g/dL (ref 30.0–36.0)
MCV: 92 fL (ref 80.0–100.0)
Monocytes Absolute: 0.9 10*3/uL (ref 0.1–1.0)
Monocytes Relative: 17 %
Neutro Abs: 2.1 10*3/uL (ref 1.7–7.7)
Neutrophils Relative %: 40 %
Platelets: 150 10*3/uL (ref 150–400)
RBC: 4.01 MIL/uL — ABNORMAL LOW (ref 4.22–5.81)
RDW: 14.7 % (ref 11.5–15.5)
WBC: 5.2 10*3/uL (ref 4.0–10.5)
nRBC: 0 % (ref 0.0–0.2)

## 2021-07-03 LAB — CMP (CANCER CENTER ONLY)
ALT: 17 U/L (ref 0–44)
AST: 14 U/L — ABNORMAL LOW (ref 15–41)
Albumin: 4 g/dL (ref 3.5–5.0)
Alkaline Phosphatase: 127 U/L — ABNORMAL HIGH (ref 38–126)
Anion gap: 4 — ABNORMAL LOW (ref 5–15)
BUN: 11 mg/dL (ref 6–20)
CO2: 27 mmol/L (ref 22–32)
Calcium: 9.4 mg/dL (ref 8.9–10.3)
Chloride: 108 mmol/L (ref 98–111)
Creatinine: 0.73 mg/dL (ref 0.61–1.24)
GFR, Estimated: >60 mL/min (ref >60)
Glucose, Bld: 144 mg/dL — ABNORMAL HIGH (ref 70–99)
Potassium: 3.6 mmol/L (ref 3.5–5.1)
Sodium: 139 mmol/L (ref 135–145)
Total Bilirubin: 0.7 mg/dL (ref 0.3–1.2)
Total Protein: 7 g/dL (ref 6.5–8.1)

## 2021-07-03 LAB — VITAMIN B12: Vitamin B-12: 1163 pg/mL — ABNORMAL HIGH (ref 180–914)

## 2021-07-03 LAB — FERRITIN: Ferritin: 165 ng/mL (ref 24–336)

## 2021-07-03 MED ORDER — SODIUM CHLORIDE 0.9 % IV SOLN
400.0000 mg/m2 | Freq: Once | INTRAVENOUS | Status: AC
  Administered 2021-07-03: 812 mg via INTRAVENOUS
  Filled 2021-07-03: qty 25

## 2021-07-03 MED ORDER — PALONOSETRON HCL INJECTION 0.25 MG/5ML
0.2500 mg | Freq: Once | INTRAVENOUS | Status: AC
  Administered 2021-07-03: 0.25 mg via INTRAVENOUS
  Filled 2021-07-03: qty 5

## 2021-07-03 MED ORDER — LORAZEPAM 0.5 MG PO TABS
0.5000 mg | ORAL_TABLET | Freq: Four times a day (QID) | ORAL | 0 refills | Status: DC | PRN
  Filled 2021-07-03: qty 30, 8d supply, fill #0

## 2021-07-03 MED ORDER — SODIUM CHLORIDE 0.9 % IV SOLN
5000.0000 mg | INTRAVENOUS | Status: DC
  Administered 2021-07-03: 5000 mg via INTRAVENOUS
  Filled 2021-07-03: qty 100

## 2021-07-03 MED ORDER — SODIUM CHLORIDE 0.9% FLUSH
10.0000 mL | Freq: Once | INTRAVENOUS | Status: AC
  Administered 2021-07-03: 10 mL

## 2021-07-03 MED ORDER — PROCHLORPERAZINE MALEATE 10 MG PO TABS
10.0000 mg | ORAL_TABLET | Freq: Four times a day (QID) | ORAL | 1 refills | Status: DC | PRN
  Filled 2021-07-04: qty 30, 8d supply, fill #0

## 2021-07-03 MED ORDER — OXALIPLATIN CHEMO INJECTION 100 MG/20ML
85.0000 mg/m2 | Freq: Once | INTRAVENOUS | Status: AC
  Administered 2021-07-03: 175 mg via INTRAVENOUS
  Filled 2021-07-03 (×2): qty 35

## 2021-07-03 MED ORDER — PROCHLORPERAZINE MALEATE 10 MG PO TABS
10.0000 mg | ORAL_TABLET | Freq: Four times a day (QID) | ORAL | 1 refills | Status: DC | PRN
  Filled 2021-07-03: qty 30, 8d supply, fill #0

## 2021-07-03 MED ORDER — DEXTROSE 5 % IV SOLN: Freq: Once | INTRAVENOUS | Status: AC

## 2021-07-03 MED ORDER — SODIUM CHLORIDE 0.9 % IV SOLN
10.0000 mg | Freq: Once | INTRAVENOUS | Status: AC
  Administered 2021-07-03: 10 mg via INTRAVENOUS
  Filled 2021-07-03: qty 10

## 2021-07-03 MED ORDER — SODIUM CHLORIDE 0.9 % IV SOLN
150.0000 mg | Freq: Once | INTRAVENOUS | Status: AC
  Administered 2021-07-03: 150 mg via INTRAVENOUS
  Filled 2021-07-03: qty 150

## 2021-07-03 MED ORDER — DRONABINOL 2.5 MG PO CAPS
2.5000 mg | ORAL_CAPSULE | Freq: Two times a day (BID) | ORAL | 0 refills | Status: DC
  Filled 2021-07-03: qty 60, 30d supply, fill #0

## 2021-07-03 MED ORDER — ATROPINE SULFATE 1 MG/ML IV SOLN
0.5000 mg | Freq: Once | INTRAVENOUS | Status: AC | PRN
  Administered 2021-07-03: 0.5 mg via INTRAVENOUS
  Filled 2021-07-03: qty 1

## 2021-07-03 MED ORDER — SODIUM CHLORIDE 0.9 % IV SOLN
150.0000 mg/m2 | Freq: Once | INTRAVENOUS | Status: AC
  Administered 2021-07-03: 300 mg via INTRAVENOUS
  Filled 2021-07-03: qty 15

## 2021-07-03 NOTE — Progress Notes (Signed)
Per Dr. Irene Limbo, okay to increase rate of 5FU pump to infuse of 44.5 hours.  New rate of infusion is 5.40ml/hr.  Patient aware of pump stop date and time.

## 2021-07-03 NOTE — Patient Instructions (Signed)
[facility] ONCOLOGY  Discharge Instructions: Thank you for choosing [facility] to provide your oncology and hematology care.   If you have a lab appointment with the [facility], please go directly to the [facility] and check in at the registration area.   Wear comfortable clothing and clothing appropriate for easy access to any Portacath or PICC line.   We strive to give you quality time with your provider. You may need to reschedule your appointment if you arrive late (15 or more minutes).  Arriving late affects you and other patients whose appointments are after yours.  Also, if you miss three or more appointments without notifying the office, you may be dismissed from the clinic at the providers discretion.      For prescription refill requests, have your pharmacy contact our office and allow 72 hours for refills to be completed.    Today you received the following chemotherapy and/or immunotherapy agents: Oxaliplatin/Irinotecan/Leucovorin/5FU.      To help prevent nausea and vomiting after your treatment, we encourage you to take your nausea medication as directed.  BELOW ARE SYMPTOMS THAT SHOULD BE REPORTED IMMEDIATELY: *FEVER GREATER THAN 100.4 F (38 C) OR HIGHER *CHILLS OR SWEATING *NAUSEA AND VOMITING THAT IS NOT CONTROLLED WITH YOUR NAUSEA MEDICATION *UNUSUAL SHORTNESS OF BREATH *UNUSUAL BRUISING OR BLEEDING *URINARY PROBLEMS (pain or burning when urinating, or frequent urination) *BOWEL PROBLEMS (unusual diarrhea, constipation, pain near the anus) TENDERNESS IN MOUTH AND THROAT WITH OR WITHOUT PRESENCE OF ULCERS (sore throat, sores in mouth, or a toothache) UNUSUAL RASH, SWELLING OR PAIN  UNUSUAL VAGINAL DISCHARGE OR ITCHING   Items with * indicate a potential emergency and should be followed up as soon as possible or go to the Emergency Department if any problems should occur.  Please show the CHEMOTHERAPY ALERT CARD or  IMMUNOTHERAPY ALERT CARD at check-in to the Emergency Department and triage nurse.  Should you have questions after your visit or need to cancel or reschedule your appointment, please contact [facility]  Dept: [phone]  and follow the prompts.  Office hours are 8:00 a.m. to 4:30 p.m. Monday - Friday. Please note that voicemails left after 4:00 p.m. may not be returned until the following business day.  We are closed weekends and major holidays. You have access to a nurse at all times for urgent questions. Please call the main number to the clinic Dept: [phone] and follow the prompts.   For any non-urgent questions, you may also contact your provider using MyChart. We now offer e-Visits for anyone 58 and older to request care online for non-urgent symptoms. For details visit [email or web address].   Also download the MyChart app! Go to the app store, search "MyChart", open the app, select [facility], and log in with your MyChart username and password.  Due to Covid, a mask is required upon entering the hospital/clinic. If you do not have a mask, one will be given to you upon arrival. For doctor visits, patients may have 1 support person aged 63 or older with them. For treatment visits, patients cannot have anyone with them due to current Covid guidelines and our immunocompromised population.

## 2021-07-04 ENCOUNTER — Other Ambulatory Visit (HOSPITAL_COMMUNITY): Payer: Self-pay

## 2021-07-04 LAB — CEA (IN HOUSE-CHCC): CEA (CHCC-In House): 4.64 ng/mL (ref 0.00–5.00)

## 2021-07-05 ENCOUNTER — Inpatient Hospital Stay: Payer: Medicaid Other

## 2021-07-05 ENCOUNTER — Other Ambulatory Visit: Payer: Self-pay

## 2021-07-05 ENCOUNTER — Other Ambulatory Visit (HOSPITAL_COMMUNITY): Payer: Self-pay

## 2021-07-05 VITALS — BP 106/70 | HR 75 | Temp 97.7°F | Resp 18

## 2021-07-05 DIAGNOSIS — C20 Malignant neoplasm of rectum: Secondary | ICD-10-CM

## 2021-07-05 DIAGNOSIS — Z7189 Other specified counseling: Secondary | ICD-10-CM

## 2021-07-05 DIAGNOSIS — Z5111 Encounter for antineoplastic chemotherapy: Secondary | ICD-10-CM | POA: Diagnosis not present

## 2021-07-05 MED ORDER — HEPARIN SOD (PORK) LOCK FLUSH 100 UNIT/ML IV SOLN
500.0000 [IU] | Freq: Once | INTRAVENOUS | Status: AC | PRN
  Administered 2021-07-05: 500 [IU]

## 2021-07-05 MED ORDER — SODIUM CHLORIDE 0.9% FLUSH
10.0000 mL | INTRAVENOUS | Status: DC | PRN
  Administered 2021-07-05: 10 mL

## 2021-07-07 ENCOUNTER — Inpatient Hospital Stay: Payer: Medicaid Other

## 2021-07-07 ENCOUNTER — Other Ambulatory Visit (HOSPITAL_COMMUNITY): Payer: Self-pay

## 2021-07-07 ENCOUNTER — Encounter: Payer: Self-pay | Admitting: Hematology

## 2021-07-07 ENCOUNTER — Other Ambulatory Visit: Payer: Self-pay

## 2021-07-07 ENCOUNTER — Telehealth: Payer: Self-pay

## 2021-07-07 VITALS — BP 100/52 | HR 77 | Temp 98.6°F | Resp 18

## 2021-07-07 DIAGNOSIS — C787 Secondary malignant neoplasm of liver and intrahepatic bile duct: Secondary | ICD-10-CM

## 2021-07-07 DIAGNOSIS — C20 Malignant neoplasm of rectum: Secondary | ICD-10-CM

## 2021-07-07 DIAGNOSIS — Z7189 Other specified counseling: Secondary | ICD-10-CM

## 2021-07-07 DIAGNOSIS — Z5111 Encounter for antineoplastic chemotherapy: Secondary | ICD-10-CM | POA: Diagnosis not present

## 2021-07-07 MED ORDER — FILGRASTIM-SNDZ 480 MCG/0.8ML IJ SOSY
480.0000 ug | PREFILLED_SYRINGE | Freq: Once | INTRAMUSCULAR | Status: AC
  Administered 2021-07-07: 480 ug via SUBCUTANEOUS
  Filled 2021-07-07: qty 0.8

## 2021-07-07 NOTE — Telephone Encounter (Signed)
Patient notified of prior authorization approval for Dronabinol 2.5 mg Capsules. Medication is approved from 07/03/2021 until further notice. Pharmacy notified. No other needs voiced at this time.

## 2021-07-08 ENCOUNTER — Encounter: Payer: Self-pay | Admitting: Hematology

## 2021-07-08 ENCOUNTER — Other Ambulatory Visit: Payer: Self-pay | Admitting: Hematology

## 2021-07-08 ENCOUNTER — Inpatient Hospital Stay: Payer: Medicaid Other

## 2021-07-08 ENCOUNTER — Other Ambulatory Visit (HOSPITAL_COMMUNITY): Payer: Self-pay

## 2021-07-08 VITALS — BP 111/71 | HR 72 | Temp 98.4°F | Resp 18

## 2021-07-08 DIAGNOSIS — C20 Malignant neoplasm of rectum: Secondary | ICD-10-CM

## 2021-07-08 DIAGNOSIS — Z5111 Encounter for antineoplastic chemotherapy: Secondary | ICD-10-CM | POA: Diagnosis not present

## 2021-07-08 DIAGNOSIS — Z7189 Other specified counseling: Secondary | ICD-10-CM

## 2021-07-08 MED ORDER — FILGRASTIM-SNDZ 480 MCG/0.8ML IJ SOSY
480.0000 ug | PREFILLED_SYRINGE | Freq: Once | INTRAMUSCULAR | Status: AC
  Administered 2021-07-08: 480 ug via SUBCUTANEOUS
  Filled 2021-07-08: qty 0.8

## 2021-07-08 NOTE — Patient Instructions (Signed)
Filgrastim, G-CSF injection °What is this medication? °FILGRASTIM, G-CSF (fil GRA stim) is a granulocyte colony-stimulating factor that stimulates the growth of neutrophils, a type of white blood cell (WBC) important in the body's fight against infection. It is used to reduce the incidence of fever and infection in patients with certain types of cancer who are receiving chemotherapy that affects the bone marrow, to stimulate blood cell production for removal of WBCs from the body prior to a bone marrow transplantation, to reduce the incidence of fever and infection in patients who have severe chronic neutropenia, and to improve survival outcomes following high-dose radiation exposure that is toxic to the bone marrow. °This medicine may be used for other purposes; ask your health care provider or pharmacist if you have questions. °COMMON BRAND NAME(S): Neupogen, Nivestym, Releuko, Zarxio °What should I tell my care team before I take this medication? °They need to know if you have any of these conditions: °kidney disease °latex allergy °ongoing radiation therapy °sickle cell disease °an unusual or allergic reaction to filgrastim, pegfilgrastim, other medicines, foods, dyes, or preservatives °pregnant or trying to get pregnant °breast-feeding °How should I use this medication? °This medicine is for injection under the skin or infusion into a vein. As an infusion into a vein, it is usually given by a health care professional in a hospital or clinic setting. If you get this medicine at home, you will be taught how to prepare and give this medicine. Refer to the Instructions for Use that come with your medication packaging. Use exactly as directed. Take your medicine at regular intervals. Do not take your medicine more often than directed. °It is important that you put your used needles and syringes in a special sharps container. Do not put them in a trash can. If you do not have a sharps container, call your pharmacist  or healthcare provider to get one. °Talk to your pediatrician regarding the use of this medicine in children. While this drug may be prescribed for children as young as 7 months for selected conditions, precautions do apply. °Overdosage: If you think you have taken too much of this medicine contact a poison control center or emergency room at once. °NOTE: This medicine is only for you. Do not share this medicine with others. °What if I miss a dose? °It is important not to miss your dose. Call your doctor or health care professional if you miss a dose. °What may interact with this medication? °This medicine may interact with the following medications: °medicines that may cause a release of neutrophils, such as lithium °This list may not describe all possible interactions. Give your health care provider a list of all the medicines, herbs, non-prescription drugs, or dietary supplements you use. Also tell them if you smoke, drink alcohol, or use illegal drugs. Some items may interact with your medicine. °What should I watch for while using this medication? °Your condition will be monitored carefully while you are receiving this medicine. °You may need blood work done while you are taking this medicine. °Talk to your health care provider about your risk of cancer. You may be more at risk for certain types of cancer if you take this medicine. °What side effects may I notice from receiving this medication? °Side effects that you should report to your doctor or health care professional as soon as possible: °allergic reactions like skin rash, itching or hives, swelling of the face, lips, or tongue °back pain °dizziness or feeling faint °fever °pain, redness, or   irritation at site where injected °pinpoint red spots on the skin °shortness of breath or breathing problems °signs and symptoms of kidney injury like trouble passing urine, change in the amount of urine, or red or dark-brown urine °stomach or side pain, or pain at  the shoulder °swelling °tiredness °unusual bleeding or bruising °Side effects that usually do not require medical attention (report to your doctor or health care professional if they continue or are bothersome): °bone pain °cough °diarrhea °hair loss °headache °muscle pain °This list may not describe all possible side effects. Call your doctor for medical advice about side effects. You may report side effects to FDA at 1-800-FDA-1088. °Where should I keep my medication? °Keep out of the reach of children. °Store in a refrigerator between 2 and 8 degrees C (36 and 46 degrees F). Do not freeze. Keep in carton to protect from light. Throw away this medicine if vials or syringes are left out of the refrigerator for more than 24 hours. Throw away any unused medicine after the expiration date. °NOTE: This sheet is a summary. It may not cover all possible information. If you have questions about this medicine, talk to your doctor, pharmacist, or health care provider. °© 2022 Elsevier/Gold Standard (2021-02-11 00:00:00) ° °

## 2021-07-09 ENCOUNTER — Other Ambulatory Visit: Payer: Self-pay

## 2021-07-09 ENCOUNTER — Inpatient Hospital Stay: Payer: Medicaid Other | Attending: Hematology

## 2021-07-09 ENCOUNTER — Encounter: Payer: Self-pay | Admitting: Hematology

## 2021-07-09 VITALS — BP 96/66 | HR 82 | Temp 98.1°F | Resp 18

## 2021-07-09 DIAGNOSIS — Z5189 Encounter for other specified aftercare: Secondary | ICD-10-CM | POA: Diagnosis not present

## 2021-07-09 DIAGNOSIS — C787 Secondary malignant neoplasm of liver and intrahepatic bile duct: Secondary | ICD-10-CM | POA: Diagnosis not present

## 2021-07-09 DIAGNOSIS — Z5111 Encounter for antineoplastic chemotherapy: Secondary | ICD-10-CM | POA: Insufficient documentation

## 2021-07-09 DIAGNOSIS — C20 Malignant neoplasm of rectum: Secondary | ICD-10-CM | POA: Insufficient documentation

## 2021-07-09 DIAGNOSIS — Z7189 Other specified counseling: Secondary | ICD-10-CM

## 2021-07-09 MED ORDER — FILGRASTIM-SNDZ 480 MCG/0.8ML IJ SOSY
480.0000 ug | PREFILLED_SYRINGE | Freq: Once | INTRAMUSCULAR | Status: AC
  Administered 2021-07-09: 480 ug via SUBCUTANEOUS
  Filled 2021-07-09: qty 0.8

## 2021-07-09 NOTE — Progress Notes (Addendum)
[place]   [phone] [phone]) [phone]   Clinic Follow up Note   Patient Care Team: [name], MD as PCP - General (Hematology) [name], MD as Consulting Physician (Urology) [name], MD as Consulting Physician (Gastroenterology) [name], MD as Consulting Physician (Colon and Rectal Surgery) [name], MD as Consulting Physician (Oncology) Royston Bake, RN as Oncology Nurse Navigator (Oncology)  Date of Service:  ..07/03/2021  CHIEF COMPLAINT:  Follow-up for continued evaluation and management of metastatic rectal cancer prior to cycle 4 of FOLFIRINOX chemotherapy.  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[place])   01/13/2021 Cancer Staging   Staging form: Colon and Rectum, AJCC 8th Edition - Clinical stage from 01/13/2021: Stage IIIB (cT3, cN2a, cM0) - Signed by [name], MD on 01/19/2021 Stage prefix: Initial diagnosis Histologic grade (G): G2 Histologic grading system: 4 grade system    01/20/2021 - 03/26/2021 Chemotherapy   Patient is on Treatment Plan : RECTAL Xelox ([place]) q21d x 6 cycles     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   He notes no acute new issues.  Some cold-induced pain in his fingers after oxaliplatin but no persistent tingling or numbness. No fevers no chills no night sweats. No perirectal pain discharge or drainage. No other acute toxicities from his previous  cycle of FOLFIRINOX chemotherapy. Labs today reviewed. We discussed repeating imaging to reassess his disease status including MRI of the abdomen and pelvis and CT of the chest and patient is agreeable to this.  MEDICAL HISTORY:  Past Medical History:  Diagnosis Date   Cancer ([place])    Tobacco abuse     SURGICAL HISTORY: Past Surgical History:  Procedure Laterality Date   HERNIA REPAIR     INCISION AND DRAINAGE ABSCESS N/A 12/01/2020   Procedure: INCISION AND DRAINAGE ABSCESS;  Surgeon: Michael Boston, MD;  Location: [facility];  Service: General;  Laterality: N/A;   INCISION AND DRAINAGE ABSCESS N/A 12/04/2020   Procedure: INCISION AND DRAINAGE FOURNIERS GANGRENE;  Surgeon: [name], MD;  Location: [facility];  Service: General;  Laterality: N/A;   INCISION AND DRAINAGE PERIRECTAL ABSCESS N/A 11/30/2020   Procedure: INCISION AND DEBRIDEMENT OF PERIRECTAL ABSCESS AND RECTAL WALL BIOPSIES;  Surgeon: Michael Boston, MD;  Location: [facility];  Service: General;  Laterality: N/A;   INGUINAL HERNIA REPAIR Right 2007   IR IMAGING GUIDED PORT INSERTION  05/07/2021   LAPAROSCOPIC LOOP COLOSTOMY N/A 12/13/2020   Procedure: DIAGNOSTIC LAPAROSCOPY WITH  OPEN DESCENDING LOOP COLOSTOMY;  Surgeon: Armandina Gemma, MD;  Location: [facility];  Service: General;  Laterality: N/A;   RECTAL SURGERY      I have reviewed the social history and family history with the patient and they are unchanged from previous note.  ALLERGIES:  is allergic to hydrocodone.  ROS .10 Point review of Systems was done is negative except as noted above.   MEDICATIONS:  Current Outpatient Medications  Medication Sig Dispense Refill  dronabinol (MARINOL) 2.5 MG capsule Take 1 capsule by mouth 2 times daily before a meal. 60 capsule 0   acetaminophen (TYLENOL) 500 MG tablet Take 2 tablets (1,000 mg total) by mouth every 8 (eight) hours as needed for mild pain or fever.     bacitracin ointment Apply topically daily. Apply to lateral  flank wounds 120 g 0   dexamethasone (DECADRON) 4 MG tablet Take 2 tablets (8 mg total) by mouth daily. Start the day after chemotherapy for 3 days. Take with food. 8 tablet 5   ferrous sulfate 325 (65 FE) MG tablet Take 1 tablet (325 mg total) by mouth daily with breakfast. 30 tablet 0   ibuprofen (ADVIL) 600 MG tablet Take 1 tablet (600 mg total) by mouth 3 (three) times daily. 30 tablet 0   lidocaine-prilocaine (EMLA) cream Apply to affected area once 30 g 3   loperamide (IMODIUM) 2 MG capsule Take 2 tablets by mouth at onset of diarrhea, then 1 tab every 2hrs until 12hr without a BM. May take 2 tab every 4hrs at bedtime. If diarrhea recurs repeat. 30 capsule 1   LORazepam (ATIVAN) 0.5 MG tablet Take 1 tablet by mouth every 6 hours as needed for anxiety. 30 tablet 0   methocarbamol (ROBAXIN) 500 MG tablet Take 1 tablet (500 mg total) by mouth every 8 (eight) hours as needed for muscle spasms. 30 tablet 0   ondansetron (ZOFRAN) 8 MG tablet Take 1 tablet (8 mg total) by mouth 2 (two) times daily as needed. Start on day 3 after chemotherapy. 30 tablet 1   potassium chloride SA (KLOR-CON) 20 MEQ tablet Take 1 tablet (20 mEq total) by mouth daily for 14 days. 14 tablet 0   prochlorperazine (COMPAZINE) 10 MG tablet Take 1 tablet (10 mg total) by mouth every 6 (six) hours as needed for nausea or vomiting. 30 tablet 1   traMADol (ULTRAM) 50 MG tablet Take 1 tablet by mouth every 8 hours as needed. 30 tablet 0   No current facility-administered medications for this visit.   Facility-Administered Medications Ordered in Other Visits  Medication Dose Route Frequency Provider Last Rate Last Admin   palonosetron (ALOXI) 0.25 MG/5ML injection            sodium chloride flush (NS) 0.9 % injection 10 mL  10 mL Intracatheter Once [name], MD        PHYSICAL EXAMINATION: ECOG PERFORMANCE STATUS: 1 - Symptomatic but completely ambulatory  Vital signs reviewed Wt Readings from Last 3 Encounters:   07/03/21 176 lb 12.8 oz (80.2 kg)  06/19/21 176 lb (79.8 kg)  06/05/21 175 lb 12.8 oz (79.7 kg)  GENERAL:alert, in no acute distress and comfortable SKIN: no acute rashes, no significant lesions EYES: conjunctiva are pink and non-injected, sclera anicteric OROPHARYNX: MMM, no exudates, no oropharyngeal erythema or ulceration NECK: supple, no JVD LYMPH:  no palpable lymphadenopathy in the cervical, axillary or inguinal regions LUNGS: clear to auscultation b/l with normal respiratory effort HEART: regular rate & rhythm ABDOMEN:  normoactive bowel sounds , non tender, not distended. Extremity: no pedal edema PSYCH: alert & oriented x 3 with fluent speech NEURO: no focal motor/sensory deficits  LABORATORY DATA:  . CBC Latest Ref Rng & Units 07/03/2021 06/19/2021 06/05/2021  WBC 4.0 - 10.5 K/uL 5.2 7.7 4.2  Hemoglobin 13.0 - 17.0 g/dL 12.3(L) 12.9(L) 12.3(L)  Hematocrit 39.0 - 52.0 % 36.9(L) 38.1(L) 36.8(L)  Platelets 150 - 400 K/uL 150 108(L) 213   .  CMP Latest Ref Rng & Units 07/03/2021 06/19/2021 06/05/2021  Glucose 70 - 99 mg/dL 144(H) 134(H) 125(H)  BUN 6 - 20 mg/dL '11 9 13  ' Creatinine 0.61 - 1.24 mg/dL 0.73 0.70 0.73  Sodium 135 - 145 mmol/L 139 141 139  Potassium 3.5 - 5.1 mmol/L 3.6 3.5 3.9  Chloride 98 - 111 mmol/L 108 110 110  CO2 22 - 32 mmol/L '27 25 24  ' Calcium 8.9 - 10.3 mg/dL 9.4 9.2 9.1  Total Protein 6.5 - 8.1 g/dL 7.0 6.8 6.9  Total Bilirubin 0.3 - 1.2 mg/dL 0.7 0.4 0.4  Alkaline Phos 38 - 126 U/L 127(H) 122 105  AST 15 - 41 U/L 14(L) 15 12(L)  ALT 0 - 44 U/L '17 22 18    ' Lab Results  Component Value Date   IRON 153 06/19/2021   TIBC 374 06/19/2021   IRONPCTSAT 41 (H) 06/19/2021   (Iron and TIBC)  Lab Results  Component Value Date   FERRITIN 165 07/03/2021    SURGICAL PATHOLOGY   THIS IS AN ADDENDUM REPORT   CASE: [id number]  PATIENT: James Coffey  Surgical Pathology Report  Addendum    Reason for Addendum #1:  DNA Mismatch Repair IHC Results    Clinical History: Fournier's Gangrene; Cancer ? ([facility])      FINAL MICROSCOPIC DIAGNOSIS:   A. IRRIGATION AND DEBRIDEMENT OF SCROTUM AND PENIS:  Acute necrotizing inflammation extensively involving fascia    B. Rectal wall biopsies:  Adenocarcinoma of the rectum        RADIOGRAPHIC STUDIES: I have personally reviewed the radiological images as listed and agreed with the findings in the report. CT Chest Wo Contrast  Result Date: 07/16/2021 CLINICAL DATA:  Rectal cancer restaging. Assess treatment response. Chemotherapy completed. EXAM: CT CHEST WITHOUT CONTRAST TECHNIQUE: Multidetector CT imaging of the chest was performed following the standard protocol without IV contrast. RADIATION DOSE REDUCTION: This exam was performed according to the departmental dose-optimization program which includes automated exposure control, adjustment of the mA and/or kV according to patient size and/or use of iterative reconstruction technique. COMPARISON:  Chest CT 04/21/2021 and 12/15/2019. Abdominal MRI 07/13/2021. FINDINGS: Cardiovascular: Right IJ Port-A-Cath extends to the superior cavoatrial junction. There is atherosclerosis of the aorta, great vessels and coronary arteries. No acute vascular findings on noncontrast imaging. The heart size is normal. There is no pericardial effusion. Mediastinum/Nodes: There are no enlarged mediastinal, hilar or axillary lymph nodes.Hilar assessment is limited by the lack of intravenous contrast, although the hilar contours appear unchanged. The thyroid gland, trachea and esophagus demonstrate no significant findings. Lungs/Pleura: No pleural effusion or pneumothorax. Mild centrilobular and paraseptal emphysema. There is a solid right upper lobe nodule measuring up to 7 mm on image 40/5 which appears unchanged from the most recent CT, although is not clearly seen on the study from 12/14/2020. No new or enlarging nodules are seen compared with the most recent study. Upper  abdomen: No acute findings are seen within the visualized upper abdomen. Probable mild hepatic steatosis. Musculoskeletal/Chest wall: There is no chest wall mass or suspicious osseous finding. IMPRESSION: 1. Solitary right upper lobe pulmonary nodule appears unchanged from the most recent CT 3 months ago, although appears new from study of 7 months ago and could reflect a stable solitary metastasis. Attention on follow-up recommended. 2. No new nodules, adenopathy or other evidence of thoracic metastatic disease. 3. Coronary and Aortic Atherosclerosis (ICD10-I70.0). Electronically Signed   By: Richardean Sale M.D.   On: 07/16/2021 13:39  ASSESSMENT & PLAN:   Kainen Struckman is a 51 y.o. male with   1.  Oligometastatic rectal cancer with 2 visible liver metastases-newly noted on CT chest abdomen pelvis. KRAS mutated MMR IHC normal   2.  Initially diagnosed locally Advanced Rectal Adenocarcinoma, c(T3c, N2), mismatch repair protein normal -presented to ED on 11/29/20 with septic shock secondary to Fournier's gangrene -CT AP showed findings suspicious for necrotizing perineal soft tissue infection with irregular presacral nodule. Rectal biopsies obtained on 11/30/20 confirmed rectal adenocarcinoma. -Baseline CEA on 12/09/20 was elevated at 16.7. -He underwent diagnostic laparoscopy and open descending loop colostomy on 12/13/20 with Dr. Harlow Asa. -Chest CT on 12/14/20 was negative for metastatic disease. -Repeat CEA on 01/06/21 showed further elevation to 52.55. -staging pelvis MRI on 01/12/21 showed: rectal adenocarcinoma T stage: At least T3c, possible involvement of prostatic apex; N stage: N2; long segment rectal tumor, involving anal sphincters. -Given locally advanced disease,he was recommended neoadjuvant Xeloda/Oxaliplatin (CAPOX)  3.  Iron deficiency anemia now resolved . Lab Results  Component Value Date   IRON 153 06/19/2021   TIBC 374 06/19/2021   IRONPCTSAT 41 (H) 06/19/2021   (Iron and  TIBC)  Lab Results  Component Value Date   FERRITIN 165 07/03/2021   4. Rectal pain- nearly resolved -tramadol prn  5insomnia -lorazepam prn  6. PERIANAL ABSCESS -draining spontaneously.  resolved with antibiotics.  PLAN -Lab results reviewed with the patient stable.  CEA levels at this time less than 5 within normal limits. -No significant toxicities from cycle 3 of FOLFIRINOX and the patient is appropriate to proceed with cycle 4 starting today. -Recommended continuing to keep pressure off his perineal area with the use of a donut pillow to avoid skin breakdown.  Perianal abscess has resolved with antibiotics. -would continue Granix as ordered to avoid neutropenia in the context of recent perianal abscess. -Supportive medications for nausea and pain were refilled. FOLFIRINOX chemotherapy orders reviewed and signed -MRI abdomen and pelvis for evaluation of disease status his liver mets and local rectal tumor prior to surgical consideration. -CT chest to evaluate lung nodule. -We shall have him follow-up with his surgeon after his imaging studies to evaluate his surgical candidacy for oligometastatic disease. -We will proceed with scheduling the next couple of chemotherapy sessions depending on needs and imaging findings and time to possible surgical intervention.  Follow-up MRI abd in 3-5 days CT chest in 3-5 days F/u with Dr Irene Limbo in [place] days   Sullivan Lone MD [place].[name]

## 2021-07-09 NOTE — Patient Instructions (Signed)
Filgrastim, G-CSF injection °What is this medication? °FILGRASTIM, G-CSF (fil GRA stim) is a granulocyte colony-stimulating factor that stimulates the growth of neutrophils, a type of white blood cell (WBC) important in the body's fight against infection. It is used to reduce the incidence of fever and infection in patients with certain types of cancer who are receiving chemotherapy that affects the bone marrow, to stimulate blood cell production for removal of WBCs from the body prior to a bone marrow transplantation, to reduce the incidence of fever and infection in patients who have severe chronic neutropenia, and to improve survival outcomes following high-dose radiation exposure that is toxic to the bone marrow. °This medicine may be used for other purposes; ask your health care provider or pharmacist if you have questions. °COMMON BRAND NAME(S): Neupogen, Nivestym, Releuko, Zarxio °What should I tell my care team before I take this medication? °They need to know if you have any of these conditions: °kidney disease °latex allergy °ongoing radiation therapy °sickle cell disease °an unusual or allergic reaction to filgrastim, pegfilgrastim, other medicines, foods, dyes, or preservatives °pregnant or trying to get pregnant °breast-feeding °How should I use this medication? °This medicine is for injection under the skin or infusion into a vein. As an infusion into a vein, it is usually given by a health care professional in a hospital or clinic setting. If you get this medicine at home, you will be taught how to prepare and give this medicine. Refer to the Instructions for Use that come with your medication packaging. Use exactly as directed. Take your medicine at regular intervals. Do not take your medicine more often than directed. °It is important that you put your used needles and syringes in a special sharps container. Do not put them in a trash can. If you do not have a sharps container, call your pharmacist  or healthcare provider to get one. °Talk to your pediatrician regarding the use of this medicine in children. While this drug may be prescribed for children as young as 7 months for selected conditions, precautions do apply. °Overdosage: If you think you have taken too much of this medicine contact a poison control center or emergency room at once. °NOTE: This medicine is only for you. Do not share this medicine with others. °What if I miss a dose? °It is important not to miss your dose. Call your doctor or health care professional if you miss a dose. °What may interact with this medication? °This medicine may interact with the following medications: °medicines that may cause a release of neutrophils, such as lithium °This list may not describe all possible interactions. Give your health care provider a list of all the medicines, herbs, non-prescription drugs, or dietary supplements you use. Also tell them if you smoke, drink alcohol, or use illegal drugs. Some items may interact with your medicine. °What should I watch for while using this medication? °Your condition will be monitored carefully while you are receiving this medicine. °You may need blood work done while you are taking this medicine. °Talk to your health care provider about your risk of cancer. You may be more at risk for certain types of cancer if you take this medicine. °What side effects may I notice from receiving this medication? °Side effects that you should report to your doctor or health care professional as soon as possible: °allergic reactions like skin rash, itching or hives, swelling of the face, lips, or tongue °back pain °dizziness or feeling faint °fever °pain, redness, or   irritation at site where injected °pinpoint red spots on the skin °shortness of breath or breathing problems °signs and symptoms of kidney injury like trouble passing urine, change in the amount of urine, or red or dark-brown urine °stomach or side pain, or pain at  the shoulder °swelling °tiredness °unusual bleeding or bruising °Side effects that usually do not require medical attention (report to your doctor or health care professional if they continue or are bothersome): °bone pain °cough °diarrhea °hair loss °headache °muscle pain °This list may not describe all possible side effects. Call your doctor for medical advice about side effects. You may report side effects to FDA at 1-800-FDA-1088. °Where should I keep my medication? °Keep out of the reach of children. °Store in a refrigerator between 2 and 8 degrees C (36 and 46 degrees F). Do not freeze. Keep in carton to protect from light. Throw away this medicine if vials or syringes are left out of the refrigerator for more than 24 hours. Throw away any unused medicine after the expiration date. °NOTE: This sheet is a summary. It may not cover all possible information. If you have questions about this medicine, talk to your doctor, pharmacist, or health care provider. °© 2022 Elsevier/Gold Standard (2021-02-11 00:00:00) ° °

## 2021-07-10 ENCOUNTER — Inpatient Hospital Stay: Payer: Medicaid Other

## 2021-07-10 ENCOUNTER — Other Ambulatory Visit: Payer: Self-pay

## 2021-07-10 VITALS — BP 122/70 | HR 83 | Temp 98.2°F | Resp 18

## 2021-07-10 DIAGNOSIS — Z7189 Other specified counseling: Secondary | ICD-10-CM

## 2021-07-10 DIAGNOSIS — C787 Secondary malignant neoplasm of liver and intrahepatic bile duct: Secondary | ICD-10-CM

## 2021-07-10 DIAGNOSIS — Z5111 Encounter for antineoplastic chemotherapy: Secondary | ICD-10-CM | POA: Diagnosis not present

## 2021-07-10 DIAGNOSIS — C20 Malignant neoplasm of rectum: Secondary | ICD-10-CM

## 2021-07-10 MED ORDER — FILGRASTIM-SNDZ 480 MCG/0.8ML IJ SOSY
480.0000 ug | PREFILLED_SYRINGE | Freq: Once | INTRAMUSCULAR | Status: AC
  Administered 2021-07-10: 480 ug via SUBCUTANEOUS
  Filled 2021-07-10: qty 0.8

## 2021-07-10 NOTE — Patient Instructions (Signed)
Filgrastim, G-CSF injection °What is this medication? °FILGRASTIM, G-CSF (fil GRA stim) is a granulocyte colony-stimulating factor that stimulates the growth of neutrophils, a type of white blood cell (WBC) important in the body's fight against infection. It is used to reduce the incidence of fever and infection in patients with certain types of cancer who are receiving chemotherapy that affects the bone marrow, to stimulate blood cell production for removal of WBCs from the body prior to a bone marrow transplantation, to reduce the incidence of fever and infection in patients who have severe chronic neutropenia, and to improve survival outcomes following high-dose radiation exposure that is toxic to the bone marrow. °This medicine may be used for other purposes; ask your health care provider or pharmacist if you have questions. °COMMON BRAND NAME(S): Neupogen, Nivestym, Releuko, Zarxio °What should I tell my care team before I take this medication? °They need to know if you have any of these conditions: °kidney disease °latex allergy °ongoing radiation therapy °sickle cell disease °an unusual or allergic reaction to filgrastim, pegfilgrastim, other medicines, foods, dyes, or preservatives °pregnant or trying to get pregnant °breast-feeding °How should I use this medication? °This medicine is for injection under the skin or infusion into a vein. As an infusion into a vein, it is usually given by a health care professional in a hospital or clinic setting. If you get this medicine at home, you will be taught how to prepare and give this medicine. Refer to the Instructions for Use that come with your medication packaging. Use exactly as directed. Take your medicine at regular intervals. Do not take your medicine more often than directed. °It is important that you put your used needles and syringes in a special sharps container. Do not put them in a trash can. If you do not have a sharps container, call your pharmacist  or healthcare provider to get one. °Talk to your pediatrician regarding the use of this medicine in children. While this drug may be prescribed for children as young as 7 months for selected conditions, precautions do apply. °Overdosage: If you think you have taken too much of this medicine contact a poison control center or emergency room at once. °NOTE: This medicine is only for you. Do not share this medicine with others. °What if I miss a dose? °It is important not to miss your dose. Call your doctor or health care professional if you miss a dose. °What may interact with this medication? °This medicine may interact with the following medications: °medicines that may cause a release of neutrophils, such as lithium °This list may not describe all possible interactions. Give your health care provider a list of all the medicines, herbs, non-prescription drugs, or dietary supplements you use. Also tell them if you smoke, drink alcohol, or use illegal drugs. Some items may interact with your medicine. °What should I watch for while using this medication? °Your condition will be monitored carefully while you are receiving this medicine. °You may need blood work done while you are taking this medicine. °Talk to your health care provider about your risk of cancer. You may be more at risk for certain types of cancer if you take this medicine. °What side effects may I notice from receiving this medication? °Side effects that you should report to your doctor or health care professional as soon as possible: °allergic reactions like skin rash, itching or hives, swelling of the face, lips, or tongue °back pain °dizziness or feeling faint °fever °pain, redness, or   irritation at site where injected °pinpoint red spots on the skin °shortness of breath or breathing problems °signs and symptoms of kidney injury like trouble passing urine, change in the amount of urine, or red or dark-brown urine °stomach or side pain, or pain at  the shoulder °swelling °tiredness °unusual bleeding or bruising °Side effects that usually do not require medical attention (report to your doctor or health care professional if they continue or are bothersome): °bone pain °cough °diarrhea °hair loss °headache °muscle pain °This list may not describe all possible side effects. Call your doctor for medical advice about side effects. You may report side effects to FDA at 1-800-FDA-1088. °Where should I keep my medication? °Keep out of the reach of children. °Store in a refrigerator between 2 and 8 degrees C (36 and 46 degrees F). Do not freeze. Keep in carton to protect from light. Throw away this medicine if vials or syringes are left out of the refrigerator for more than 24 hours. Throw away any unused medicine after the expiration date. °NOTE: This sheet is a summary. It may not cover all possible information. If you have questions about this medicine, talk to your doctor, pharmacist, or health care provider. °© 2022 Elsevier/Gold Standard (2021-02-11 00:00:00) ° °

## 2021-07-13 ENCOUNTER — Ambulatory Visit (HOSPITAL_COMMUNITY)
Admission: RE | Admit: 2021-07-13 | Discharge: 2021-07-13 | Disposition: A | Discharge status: Home / Self Care | Payer: Medicaid Other | Source: Ambulatory Visit | Attending: Hematology | Admitting: Hematology

## 2021-07-13 DIAGNOSIS — C20 Malignant neoplasm of rectum: Secondary | ICD-10-CM | POA: Diagnosis present

## 2021-07-13 DIAGNOSIS — C787 Secondary malignant neoplasm of liver and intrahepatic bile duct: Secondary | ICD-10-CM | POA: Diagnosis present

## 2021-07-13 MED ORDER — GADOBUTROL 1 MMOL/ML IV SOLN
8.0000 mL | Freq: Once | INTRAVENOUS | Status: AC | PRN
  Administered 2021-07-13: 8 mL via INTRAVENOUS

## 2021-07-15 ENCOUNTER — Ambulatory Visit (HOSPITAL_COMMUNITY)
Admission: RE | Admit: 2021-07-15 | Discharge: 2021-07-15 | Disposition: A | Discharge status: Home / Self Care | Payer: Medicaid Other | Source: Ambulatory Visit | Attending: Hematology | Admitting: Hematology

## 2021-07-15 ENCOUNTER — Encounter (HOSPITAL_COMMUNITY): Payer: Self-pay

## 2021-07-15 ENCOUNTER — Other Ambulatory Visit: Payer: Self-pay

## 2021-07-15 DIAGNOSIS — C20 Malignant neoplasm of rectum: Secondary | ICD-10-CM | POA: Diagnosis not present

## 2021-07-15 DIAGNOSIS — C787 Secondary malignant neoplasm of liver and intrahepatic bile duct: Secondary | ICD-10-CM | POA: Insufficient documentation

## 2021-07-16 ENCOUNTER — Other Ambulatory Visit (HOSPITAL_COMMUNITY): Payer: Self-pay

## 2021-07-16 ENCOUNTER — Other Ambulatory Visit: Payer: Self-pay

## 2021-07-16 DIAGNOSIS — C20 Malignant neoplasm of rectum: Secondary | ICD-10-CM

## 2021-07-17 ENCOUNTER — Inpatient Hospital Stay (HOSPITAL_BASED_OUTPATIENT_CLINIC_OR_DEPARTMENT_OTHER): Payer: Medicaid Other | Admitting: Hematology

## 2021-07-17 ENCOUNTER — Other Ambulatory Visit: Payer: Self-pay

## 2021-07-17 ENCOUNTER — Inpatient Hospital Stay: Payer: Medicaid Other

## 2021-07-17 ENCOUNTER — Other Ambulatory Visit (HOSPITAL_COMMUNITY): Payer: Self-pay

## 2021-07-17 DIAGNOSIS — C20 Malignant neoplasm of rectum: Secondary | ICD-10-CM

## 2021-07-17 DIAGNOSIS — Z7189 Other specified counseling: Secondary | ICD-10-CM | POA: Diagnosis not present

## 2021-07-17 DIAGNOSIS — Z95828 Presence of other vascular implants and grafts: Secondary | ICD-10-CM

## 2021-07-17 DIAGNOSIS — C787 Secondary malignant neoplasm of liver and intrahepatic bile duct: Secondary | ICD-10-CM

## 2021-07-17 DIAGNOSIS — Z5111 Encounter for antineoplastic chemotherapy: Secondary | ICD-10-CM | POA: Diagnosis not present

## 2021-07-17 LAB — CBC WITH DIFFERENTIAL (CANCER CENTER ONLY)
Abs Immature Granulocytes: 0.1 10*3/uL — ABNORMAL HIGH (ref 0.00–0.07)
Basophils Absolute: 0.1 10*3/uL (ref 0.0–0.1)
Basophils Relative: 1 %
Eosinophils Absolute: 0.1 10*3/uL (ref 0.0–0.5)
Eosinophils Relative: 2 %
HCT: 38.5 % — ABNORMAL LOW (ref 39.0–52.0)
Hemoglobin: 13 g/dL (ref 13.0–17.0)
Immature Granulocytes: 2 %
Lymphocytes Relative: 40 %
Lymphs Abs: 2.3 10*3/uL (ref 0.7–4.0)
MCH: 30.7 pg (ref 26.0–34.0)
MCHC: 33.8 g/dL (ref 30.0–36.0)
MCV: 91 fL (ref 80.0–100.0)
Monocytes Absolute: 0.9 10*3/uL (ref 0.1–1.0)
Monocytes Relative: 16 %
Neutro Abs: 2.2 10*3/uL (ref 1.7–7.7)
Neutrophils Relative %: 39 %
Platelet Count: 138 10*3/uL — ABNORMAL LOW (ref 150–400)
RBC: 4.23 MIL/uL (ref 4.22–5.81)
RDW: 15.3 % (ref 11.5–15.5)
WBC Count: 5.6 10*3/uL (ref 4.0–10.5)
nRBC: 0 % (ref 0.0–0.2)

## 2021-07-17 LAB — CMP (CANCER CENTER ONLY)
ALT: 23 U/L (ref 0–44)
AST: 19 U/L (ref 15–41)
Albumin: 4.1 g/dL (ref 3.5–5.0)
Alkaline Phosphatase: 133 U/L — ABNORMAL HIGH (ref 38–126)
Anion gap: 7 (ref 5–15)
BUN: 11 mg/dL (ref 6–20)
CO2: 25 mmol/L (ref 22–32)
Calcium: 9.4 mg/dL (ref 8.9–10.3)
Chloride: 107 mmol/L (ref 98–111)
Creatinine: 0.79 mg/dL (ref 0.61–1.24)
GFR, Estimated: >60 mL/min (ref >60)
Glucose, Bld: 132 mg/dL — ABNORMAL HIGH (ref 70–99)
Potassium: 3.8 mmol/L (ref 3.5–5.1)
Sodium: 139 mmol/L (ref 135–145)
Total Bilirubin: 0.6 mg/dL (ref 0.3–1.2)
Total Protein: 7.2 g/dL (ref 6.5–8.1)

## 2021-07-17 LAB — VITAMIN B12: Vitamin B-12: 1252 pg/mL — ABNORMAL HIGH (ref 180–914)

## 2021-07-17 LAB — IRON AND IRON BINDING CAPACITY (CC-WL,HP ONLY)
Iron: 97 ug/dL (ref 45–182)
Saturation Ratios: 23 % (ref 17.9–39.5)
TIBC: 419 ug/dL (ref 250–450)
UIBC: 322 ug/dL (ref 117–376)

## 2021-07-17 LAB — CEA (IN HOUSE-CHCC): CEA (CHCC-In House): 3.12 ng/mL (ref 0.00–5.00)

## 2021-07-17 LAB — FERRITIN: Ferritin: 211 ng/mL (ref 24–336)

## 2021-07-17 MED ORDER — SODIUM CHLORIDE 0.9 % IV SOLN
2460.0000 mg/m2 | INTRAVENOUS | Status: DC
  Administered 2021-07-17: 5000 mg via INTRAVENOUS
  Filled 2021-07-17: qty 100

## 2021-07-17 MED ORDER — SODIUM CHLORIDE 0.9 % IV SOLN
400.0000 mg/m2 | Freq: Once | INTRAVENOUS | Status: AC
  Administered 2021-07-17: 812 mg via INTRAVENOUS
  Filled 2021-07-17: qty 25

## 2021-07-17 MED ORDER — LORAZEPAM 0.5 MG PO TABS
0.5000 mg | ORAL_TABLET | Freq: Four times a day (QID) | ORAL | 0 refills | Status: DC | PRN
  Filled 2021-07-17: qty 30, 8d supply, fill #0

## 2021-07-17 MED ORDER — DEXTROSE 5 % IV SOLN: Freq: Once | INTRAVENOUS | Status: AC

## 2021-07-17 MED ORDER — PALONOSETRON HCL INJECTION 0.25 MG/5ML
0.2500 mg | Freq: Once | INTRAVENOUS | Status: AC
  Administered 2021-07-17: 0.25 mg via INTRAVENOUS
  Filled 2021-07-17: qty 5

## 2021-07-17 MED ORDER — SODIUM CHLORIDE 0.9 % IV SOLN
10.0000 mg | Freq: Once | INTRAVENOUS | Status: AC
  Administered 2021-07-17: 10 mg via INTRAVENOUS
  Filled 2021-07-17: qty 10

## 2021-07-17 MED ORDER — ATROPINE SULFATE 1 MG/ML IV SOLN
0.5000 mg | Freq: Once | INTRAVENOUS | Status: AC | PRN
  Administered 2021-07-17: 0.5 mg via INTRAVENOUS
  Filled 2021-07-17: qty 1

## 2021-07-17 MED ORDER — SODIUM CHLORIDE 0.9 % IV SOLN
150.0000 mg/m2 | Freq: Once | INTRAVENOUS | Status: AC
  Administered 2021-07-17: 300 mg via INTRAVENOUS
  Filled 2021-07-17: qty 15

## 2021-07-17 MED ORDER — SODIUM CHLORIDE 0.9% FLUSH
10.0000 mL | Freq: Once | INTRAVENOUS | Status: AC
  Administered 2021-07-17: 10 mL

## 2021-07-17 MED ORDER — PROCHLORPERAZINE MALEATE 10 MG PO TABS
10.0000 mg | ORAL_TABLET | Freq: Four times a day (QID) | ORAL | 1 refills | Status: DC | PRN
  Filled 2021-07-17: qty 30, 8d supply, fill #0
  Filled 2021-08-01: qty 30, 8d supply, fill #1

## 2021-07-17 MED ORDER — SODIUM CHLORIDE 0.9 % IV SOLN
150.0000 mg | Freq: Once | INTRAVENOUS | Status: AC
  Administered 2021-07-17: 150 mg via INTRAVENOUS
  Filled 2021-07-17: qty 150

## 2021-07-17 NOTE — Progress Notes (Cosign Needed Addendum)
[place]   [phone] Fax:([phone]   Clinic Follow up Note   Patient Care Team: [name], MD as PCP - General (Hematology) [name], MD as Consulting Physician (Urology) [name], MD as Consulting Physician (Gastroenterology) [name], MD as Consulting Physician (Colon and Rectal Surgery) [name], MD as Consulting Physician (Oncology) Royston Bake, RN as Oncology Nurse Navigator (Oncology)  Date of Service:  .07/17/2021  CHIEF COMPLAINT:  Follow-up for continued evaluation and management of metastatic rectal carcinoma  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[place])  12/17/2020 Initial Diagnosis   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[place])   01/13/2021 Cancer Staging   Staging form: Colon and Rectum, AJCC 8th Edition - Clinical stage from 01/13/2021: Stage IIIB (cT3, cN2a, cM0) - Signed by [name], MD on 01/19/2021 Stage prefix: Initial diagnosis Histologic grade (G): G2 Histologic grading system: 4 grade system    01/20/2021 - 03/26/2021 Chemotherapy   Patient is on Treatment Plan : RECTAL Xelox ([place]ox) q21d x 6 cycles     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  He notes no acute issues since his last chemotherapy.  No new perianal discomfort pain or bleeding.  Good colostomy output.  Good p.o. intake Minimal cold-induced tingling in his fingers and toes.  No pain.  Since his last clinic visit patient had an MRI of the abdomen and pelvis on 07/13/2021 which showed stable to  smaller size of his hepatic metastasis.  No new lesions. MRI pelvis shows considerable decrease in bulkiness of low rectal tumor since the previous study.  Some spiculated densities in the perirectal fat thought to be related to posttreatment fibrosis.  Decreased perirectal lymphadenopathy.  No new or progressive disease. CT chest showed solitary 7 mm right upper lobe nodule which is unchanged from the previous CT 3 months ago.  Labs done today were reviewed Discussed results with the patient's daughter on the phone who speaks [place] and is a Presenter, broadcasting.   MEDICAL HISTORY:  Past Medical History:  Diagnosis Date   Cancer ([place])    Tobacco abuse     SURGICAL HISTORY: Past Surgical History:  Procedure Laterality Date   HERNIA REPAIR     INCISION AND DRAINAGE ABSCESS N/A 12/01/2020   Procedure: INCISION AND DRAINAGE ABSCESS;  Surgeon: Michael Boston, MD;  Location: [facility];  Service: General;  Laterality: N/A;   INCISION AND DRAINAGE ABSCESS N/A 12/04/2020   Procedure: INCISION AND DRAINAGE FOURNIERS GANGRENE;  Surgeon: [name], MD;  Location: [facility];  Service: General;  Laterality: N/A;   INCISION AND DRAINAGE PERIRECTAL ABSCESS N/A 11/30/2020   Procedure: INCISION AND DEBRIDEMENT OF PERIRECTAL ABSCESS AND RECTAL WALL BIOPSIES;  Surgeon: Michael Boston, MD;  Location: [facility];  Service: General;  Laterality: N/A;   INGUINAL HERNIA REPAIR Right 2007   IR IMAGING GUIDED PORT INSERTION  05/07/2021   LAPAROSCOPIC LOOP COLOSTOMY N/A 12/13/2020   Procedure: DIAGNOSTIC LAPAROSCOPY WITH  OPEN DESCENDING LOOP COLOSTOMY;  Surgeon: Armandina Gemma, MD;  Location: [facility];  Service: General;  Laterality: N/A;  RECTAL SURGERY      I have reviewed the social history and family history with the patient and they are unchanged from previous note.  ALLERGIES:  is allergic to hydrocodone.  ROS 10 Point review of Systems was done is negative except as noted above.  MEDICATIONS:  Current  Outpatient Medications  Medication Sig Dispense Refill   acetaminophen (TYLENOL) 500 MG tablet Take 2 tablets (1,000 mg total) by mouth every 8 (eight) hours as needed for mild pain or fever.     bacitracin ointment Apply topically daily. Apply to lateral flank wounds 120 g 0   dexamethasone (DECADRON) 4 MG tablet Take 2 tablets (8 mg total) by mouth daily. Start the day after chemotherapy for 3 days. Take with food. 8 tablet 5   dronabinol (MARINOL) 2.5 MG capsule Take 1 capsule by mouth 2 times daily before a meal. 60 capsule 0   ferrous sulfate 325 (65 FE) MG tablet Take 1 tablet (325 mg total) by mouth daily with breakfast. 30 tablet 0   ibuprofen (ADVIL) 600 MG tablet Take 1 tablet (600 mg total) by mouth 3 (three) times daily. 30 tablet 0   lidocaine-prilocaine (EMLA) cream Apply to affected area once 30 g 3   loperamide (IMODIUM) 2 MG capsule Take 2 tablets by mouth at onset of diarrhea, then 1 tab every 2hrs until 12hr without a BM. May take 2 tab every 4hrs at bedtime. If diarrhea recurs repeat. 30 capsule 1   LORazepam (ATIVAN) 0.5 MG tablet Take 1 tablet by mouth every 6 hours as needed for anxiety. 30 tablet 0   methocarbamol (ROBAXIN) 500 MG tablet Take 1 tablet (500 mg total) by mouth every 8 (eight) hours as needed for muscle spasms. 30 tablet 0   ondansetron (ZOFRAN) 8 MG tablet Take 1 tablet (8 mg total) by mouth 2 (two) times daily as needed. Start on day 3 after chemotherapy. 30 tablet 1   potassium chloride SA (KLOR-CON) 20 MEQ tablet Take 1 tablet (20 mEq total) by mouth daily for 14 days. 14 tablet 0   prochlorperazine (COMPAZINE) 10 MG tablet Take 1 tablet (10 mg total) by mouth every 6 (six) hours as needed for nausea or vomiting. 30 tablet 1   traMADol (ULTRAM) 50 MG tablet Take 1 tablet by mouth every 8 hours as needed. 30 tablet 0   No current facility-administered medications for this visit.   Facility-Administered Medications Ordered in Other Visits  Medication Dose  Route Frequency Provider Last Rate Last Admin   palonosetron (ALOXI) 0.25 MG/5ML injection            sodium chloride flush (NS) 0.9 % injection 10 mL  10 mL Intracatheter Once [name], MD        PHYSICAL EXAMINATION: ECOG PERFORMANCE STATUS: 1 - Symptomatic but completely ambulatory  Vitals:   07/17/21 0948  BP: 109/72  Pulse: 73  Resp: 20  Temp: 97.7 F (36.5 C)  SpO2: 99%     Wt Readings from Last 3 Encounters:  07/03/21 176 lb 12.8 oz (80.2 kg)  06/19/21 176 lb (79.8 kg)  06/05/21 175 lb 12.8 oz (79.7 kg)   . NAD GENERAL:alert, in no acute distress and comfortable SKIN: no acute rashes, no significant lesions EYES: conjunctiva are pink and non-injected, sclera anicteric OROPHARYNX: MMM, no exudates, no oropharyngeal erythema or ulceration NECK: supple, no JVD LYMPH:  no palpable lymphadenopathy in the cervical, axillary or inguinal regions LUNGS: clear to auscultation b/l with normal  respiratory effort HEART: regular rate & rhythm ABDOMEN:  normoactive bowel sounds , non tender, not distended.  Perianal abscess resolved. Extremity: no pedal edema PSYCH: alert & oriented x 3 with fluent speech NEURO: no focal motor/sensory deficits    LABORATORY DATA:  . CBC Latest Ref Rng & Units 07/17/2021 07/03/2021  WBC 4.0 - 10.5 K/uL 5.6 5.2  Hemoglobin 13.0 - 17.0 g/dL 13.0 12.3(L)  Hematocrit 39.0 - 52.0 % 38.5(L) 36.9(L)  Platelets 150 - 400 K/uL 138(L) 150   . CMP Latest Ref Rng & Units 07/17/2021 07/03/2021 06/19/2021  Glucose 70 - 99 mg/dL 132(H) 144(H) 134(H)  BUN 6 - 20 mg/dL '11 11 9  ' Creatinine 0.61 - 1.24 mg/dL 0.79 0.73 0.70  Sodium 135 - 145 mmol/L 139 139 141  Potassium 3.5 - 5.1 mmol/L 3.8 3.6 3.5  Chloride 98 - 111 mmol/L 107 108 110  CO2 22 - 32 mmol/L '25 27 25  ' Calcium 8.9 - 10.3 mg/dL 9.4 9.4 9.2  Total Protein 6.5 - 8.1 g/dL 7.2 7.0 6.8  Total Bilirubin 0.3 - 1.2 mg/dL 0.6 0.7 0.4  Alkaline Phos 38 - 126 U/L 133(H) 127(H) 122  AST 15 - 41  U/L 19 14(L) 15  ALT 0 - 44 U/L '23 17 22    ' Lab Results  Component Value Date   IRON 153 06/19/2021   TIBC 374 06/19/2021   IRONPCTSAT 41 (H) 06/19/2021   (Iron and TIBC)  Lab Results  Component Value Date   FERRITIN 165 07/03/2021    SURGICAL PATHOLOGY   THIS IS AN ADDENDUM REPORT   CASE: [id number]  PATIENT: [facility]  Surgical Pathology Report  Addendum    Reason for Addendum #1:  DNA Mismatch Repair IHC Results   Clinical History: Fournier's Gangrene; Cancer ? ([facility])   FINAL MICROSCOPIC DIAGNOSIS:   A. IRRIGATION AND DEBRIDEMENT OF SCROTUM AND PENIS:  Acute necrotizing inflammation extensively involving fascia    B. Rectal wall biopsies:  Adenocarcinoma of the rectum        RADIOGRAPHIC STUDIES: I have personally reviewed the radiological images as listed and agreed with the findings in the report.  CT Chest Wo Contrast  Result Date: 07/16/2021 CLINICAL DATA:  Rectal cancer restaging. Assess treatment response. Chemotherapy completed. EXAM: CT CHEST WITHOUT CONTRAST TECHNIQUE: Multidetector CT imaging of the chest was performed following the standard protocol without IV contrast. RADIATION DOSE REDUCTION: This exam was performed according to the departmental dose-optimization program which includes automated exposure control, adjustment of the mA and/or kV according to patient size and/or use of iterative reconstruction technique. COMPARISON:  Chest CT 04/21/2021 and 12/15/2019. Abdominal MRI 07/13/2021. FINDINGS: Cardiovascular: Right IJ Port-A-Cath extends to the superior cavoatrial junction. There is atherosclerosis of the aorta, great vessels and coronary arteries. No acute vascular findings on noncontrast imaging. The heart size is normal. There is no pericardial effusion. Mediastinum/Nodes: There are no enlarged mediastinal, hilar or axillary lymph nodes.Hilar assessment is limited by the lack of intravenous contrast, although the hilar contours appear  unchanged. The thyroid gland, trachea and esophagus demonstrate no significant findings. Lungs/Pleura: No pleural effusion or pneumothorax. Mild centrilobular and paraseptal emphysema. There is a solid right upper lobe nodule measuring up to 7 mm on image 40/5 which appears unchanged from the most recent CT, although is not clearly seen on the study from 12/14/2020. No new or enlarging nodules are seen compared with the most recent study. Upper abdomen: No acute findings are seen within the visualized upper abdomen.  Probable mild hepatic steatosis. Musculoskeletal/Chest wall: There is no chest wall mass or suspicious osseous finding. IMPRESSION: 1. Solitary right upper lobe pulmonary nodule appears unchanged from the most recent CT 3 months ago, although appears new from study of 7 months ago and could reflect a stable solitary metastasis. Attention on follow-up recommended. 2. No new nodules, adenopathy or other evidence of thoracic metastatic disease. 3. Coronary and Aortic Atherosclerosis (ICD10-I70.0). Electronically Signed   By: Richardean Sale M.D.   On: 07/16/2021 13:39     MRI abdomen 07/13/2021 - IMPRESSION: 1. Motion degraded exam, especially the pre and postcontrast dynamic series. Therefore, analysis primarily performed on the noncontrast images. 2. The right hepatic lobe lesion measures slightly smaller today. The left hepatic lobe lesion measures similarly and may extend slightly more caudally today. Especially this lesion demonstrates residual restricted diffusion, suggesting viable metastasis. No new lesions identified. 3.  Aortic Atherosclerosis (ICD10-I70.0).   MRI pelvis 07/13/2021 Considerable decrease in bulkiness of low rectal tumor since prior study. Adjacent spiculated densities in the low perirectal fat are consistent with post treatment fibrosis, although some residual tumor cannot be excluded.   Decreased perirectal lymphadenopathy since prior exam.   No new or progressive  disease identified.  ASSESSMENT & PLAN:   James Coffey is a 51 y.o. male with   Discussed lab findings and found no new nodules. Spots in abdomen have decreased in size and become less active. Nodules in liver are 1.8cm down to 1.4cm. CEA level improved and discussed with pt. Potential surgical procedure discussed with pt.   1.  Oligometastatic rectal cancer with 2 visible liver metastases-newly noted on CT chest abdomen pelvis. KRAS mutated MMR IHC normal   2.  Initially diagnosed locally Advanced Rectal Adenocarcinoma, c(T3c, N2), mismatch repair protein normal -presented to ED on 11/29/20 with septic shock secondary to Fournier's gangrene -CT AP showed findings suspicious for necrotizing perineal soft tissue infection with irregular presacral nodule. Rectal biopsies obtained on 11/30/20 confirmed rectal adenocarcinoma. -Baseline CEA on 12/09/20 was elevated at 16.7. -He underwent diagnostic laparoscopy and open descending loop colostomy on 12/13/20 with Dr. Harlow Asa. -Chest CT on 12/14/20 was negative for metastatic disease. -Repeat CEA on 01/06/21 showed further elevation to 52.55. -staging pelvis MRI on 01/12/21 showed: rectal adenocarcinoma T stage: At least T3c, possible involvement of prostatic apex; N stage: N2; long segment rectal tumor, involving anal sphincters. -Given locally advanced disease,he was recommended neoadjuvant Xeloda/Oxaliplatin (CAPOX)  3.  Iron deficiency anemia now resolved . Lab Results  Component Value Date   IRON 153 06/19/2021   TIBC 374 06/19/2021   IRONPCTSAT 41 (H) 06/19/2021   (Iron and TIBC)  Lab Results  Component Value Date   FERRITIN 165 07/03/2021   4. Rectal pain- nearly resolved -tramadol prn 5insomnia -lorazepam prn  6. PERIANAL ABSCESS -draining spontaneously.  Now resolved with antibiotics.  PLAN -Patient is doing very well with his chemotherapy.  Labs are stable. -His tumor markers continue to improve with his FOLFIRINOX. -His MRI of  the abdomen shows stable to improved liver lesions with no new lesions. -MRI of the pelvis shows significant improvement in his pelvic lymphadenopathy and rectal mass suggesting good response to treatment. -CT chest shows stable right upper lobe lung lesion which is indeterminate. -Overall his current picture suggests chemotherapy sensitive disease with no signs of progression suggesting resistant disease at this time. -His best outcome at this point would be likely achieved with surgery to resect his primary and possibly his liver lesions and  consideration of SBRT to his lung lesion. -We will continue him on FOLFIRI at this time pending surgical evaluation and final decision regarding surgery to avoid interim progression. -He has an excellent functional status and I think would be a good surgical candidate.  He is also very motivated to pursue surgical options.  Follow-up Referral to Dr. Sandrea Matte as soon as possible for consideration of rectal surgery and hepatic metastatectomy -MRI pelvis as soon as possible -Please schedule next 2 cycles of FOLFIRI as per orders with port flush labs and MD visits  Sullivan Lone MD [facility]/Oncology Physician [facility]

## 2021-07-17 NOTE — Patient Instructions (Signed)
[facility] ONCOLOGY   Discharge Instructions: Thank you for choosing [facility] to provide your oncology and hematology care.   If you have a lab appointment with the [facility], please go directly to the [facility] and check in at the registration area.   Wear comfortable clothing and clothing appropriate for easy access to any Portacath or PICC line.   We strive to give you quality time with your provider. You may need to reschedule your appointment if you arrive late (15 or more minutes).  Arriving late affects you and other patients whose appointments are after yours.  Also, if you miss three or more appointments without notifying the office, you may be dismissed from the clinic at the providers discretion.      For prescription refill requests, have your pharmacy contact our office and allow 72 hours for refills to be completed.    Today you received the following chemotherapy and/or immunotherapy agents: irinotecan, leucovorin, and fluorouracil      To help prevent nausea and vomiting after your treatment, we encourage you to take your nausea medication as directed.  BELOW ARE SYMPTOMS THAT SHOULD BE REPORTED IMMEDIATELY: *FEVER GREATER THAN 100.4 F (38 C) OR HIGHER *CHILLS OR SWEATING *NAUSEA AND VOMITING THAT IS NOT CONTROLLED WITH YOUR NAUSEA MEDICATION *UNUSUAL SHORTNESS OF BREATH *UNUSUAL BRUISING OR BLEEDING *URINARY PROBLEMS (pain or burning when urinating, or frequent urination) *BOWEL PROBLEMS (unusual diarrhea, constipation, pain near the anus) TENDERNESS IN MOUTH AND THROAT WITH OR WITHOUT PRESENCE OF ULCERS (sore throat, sores in mouth, or a toothache) UNUSUAL RASH, SWELLING OR PAIN  UNUSUAL VAGINAL DISCHARGE OR ITCHING   Items with * indicate a potential emergency and should be followed up as soon as possible or go to the Emergency Department if any problems should occur.  Please show the CHEMOTHERAPY ALERT CARD or  IMMUNOTHERAPY ALERT CARD at check-in to the Emergency Department and triage nurse.  Should you have questions after your visit or need to cancel or reschedule your appointment, please contact [facility]  Dept: [phone]  and follow the prompts.  Office hours are 8:00 a.m. to 4:30 p.m. Monday - Friday. Please note that voicemails left after 4:00 p.m. may not be returned until the following business day.  We are closed weekends and major holidays. You have access to a nurse at all times for urgent questions. Please call the main number to the clinic Dept: [phone] and follow the prompts.   For any non-urgent questions, you may also contact your provider using MyChart. We now offer e-Visits for anyone 41 and older to request care online for non-urgent symptoms. For details visit [email or web address].   Also download the MyChart app! Go to the app store, search "MyChart", open the app, select [facility], and log in with your MyChart username and password.  Due to Covid, a mask is required upon entering the hospital/clinic. If you do not have a mask, one will be given to you upon arrival. For doctor visits, patients may have 1 support person aged 22 or older with them. For treatment visits, patients cannot have anyone with them due to current Covid guidelines and our immunocompromised population.

## 2021-07-18 ENCOUNTER — Telehealth: Payer: Self-pay | Admitting: Hematology

## 2021-07-18 NOTE — Telephone Encounter (Signed)
Left message with follow-up appointments per 2/9 los.

## 2021-07-19 ENCOUNTER — Inpatient Hospital Stay: Payer: Medicaid Other

## 2021-07-19 ENCOUNTER — Other Ambulatory Visit: Payer: Self-pay

## 2021-07-19 VITALS — BP 109/73 | HR 71 | Temp 97.9°F | Resp 20

## 2021-07-19 DIAGNOSIS — Z95828 Presence of other vascular implants and grafts: Secondary | ICD-10-CM

## 2021-07-19 DIAGNOSIS — Z5111 Encounter for antineoplastic chemotherapy: Secondary | ICD-10-CM | POA: Diagnosis not present

## 2021-07-19 MED ORDER — SODIUM CHLORIDE 0.9% FLUSH
10.0000 mL | Freq: Once | INTRAVENOUS | Status: AC
  Administered 2021-07-19: 10 mL

## 2021-07-19 MED ORDER — HEPARIN SOD (PORK) LOCK FLUSH 100 UNIT/ML IV SOLN
500.0000 [IU] | Freq: Once | INTRAVENOUS | Status: AC
  Administered 2021-07-19: 500 [IU]

## 2021-07-21 ENCOUNTER — Inpatient Hospital Stay: Payer: Medicaid Other

## 2021-07-21 VITALS — BP 109/70 | HR 80 | Temp 98.6°F | Resp 20

## 2021-07-21 DIAGNOSIS — C20 Malignant neoplasm of rectum: Secondary | ICD-10-CM

## 2021-07-21 DIAGNOSIS — Z7189 Other specified counseling: Secondary | ICD-10-CM

## 2021-07-21 DIAGNOSIS — C787 Secondary malignant neoplasm of liver and intrahepatic bile duct: Secondary | ICD-10-CM

## 2021-07-21 DIAGNOSIS — Z5111 Encounter for antineoplastic chemotherapy: Secondary | ICD-10-CM | POA: Diagnosis not present

## 2021-07-21 MED ORDER — FILGRASTIM-SNDZ 480 MCG/0.8ML IJ SOSY
480.0000 ug | PREFILLED_SYRINGE | Freq: Once | INTRAMUSCULAR | Status: AC
  Administered 2021-07-21: 480 ug via SUBCUTANEOUS
  Filled 2021-07-21: qty 0.8

## 2021-07-22 ENCOUNTER — Inpatient Hospital Stay: Payer: Medicaid Other

## 2021-07-22 ENCOUNTER — Other Ambulatory Visit: Payer: Self-pay

## 2021-07-22 VITALS — BP 111/78 | HR 77 | Temp 98.3°F | Resp 18

## 2021-07-22 DIAGNOSIS — C20 Malignant neoplasm of rectum: Secondary | ICD-10-CM

## 2021-07-22 DIAGNOSIS — C787 Secondary malignant neoplasm of liver and intrahepatic bile duct: Secondary | ICD-10-CM

## 2021-07-22 DIAGNOSIS — Z7189 Other specified counseling: Secondary | ICD-10-CM

## 2021-07-22 DIAGNOSIS — Z5111 Encounter for antineoplastic chemotherapy: Secondary | ICD-10-CM | POA: Diagnosis not present

## 2021-07-22 MED ORDER — FILGRASTIM-SNDZ 480 MCG/0.8ML IJ SOSY
480.0000 ug | PREFILLED_SYRINGE | Freq: Once | INTRAMUSCULAR | Status: AC
  Administered 2021-07-22: 480 ug via SUBCUTANEOUS
  Filled 2021-07-22: qty 0.8

## 2021-07-23 ENCOUNTER — Inpatient Hospital Stay: Payer: Medicaid Other

## 2021-07-23 ENCOUNTER — Encounter: Payer: Self-pay | Admitting: Hematology

## 2021-07-23 VITALS — BP 102/64 | HR 73 | Temp 98.1°F | Resp 18

## 2021-07-23 DIAGNOSIS — C20 Malignant neoplasm of rectum: Secondary | ICD-10-CM

## 2021-07-23 DIAGNOSIS — Z7189 Other specified counseling: Secondary | ICD-10-CM

## 2021-07-23 DIAGNOSIS — Z5111 Encounter for antineoplastic chemotherapy: Secondary | ICD-10-CM | POA: Diagnosis not present

## 2021-07-23 MED ORDER — FILGRASTIM-SNDZ 480 MCG/0.8ML IJ SOSY
480.0000 ug | PREFILLED_SYRINGE | Freq: Once | INTRAMUSCULAR | Status: AC
  Administered 2021-07-23: 480 ug via SUBCUTANEOUS
  Filled 2021-07-23: qty 0.8

## 2021-07-23 NOTE — Patient Instructions (Signed)
Filgrastim, G-CSF injection °What is this medication? °FILGRASTIM, G-CSF (fil GRA stim) is a granulocyte colony-stimulating factor that stimulates the growth of neutrophils, a type of white blood cell (WBC) important in the body's fight against infection. It is used to reduce the incidence of fever and infection in patients with certain types of cancer who are receiving chemotherapy that affects the bone marrow, to stimulate blood cell production for removal of WBCs from the body prior to a bone marrow transplantation, to reduce the incidence of fever and infection in patients who have severe chronic neutropenia, and to improve survival outcomes following high-dose radiation exposure that is toxic to the bone marrow. °This medicine may be used for other purposes; ask your health care provider or pharmacist if you have questions. °COMMON BRAND NAME(S): Neupogen, Nivestym, Releuko, Zarxio °What should I tell my care team before I take this medication? °They need to know if you have any of these conditions: °kidney disease °latex allergy °ongoing radiation therapy °sickle cell disease °an unusual or allergic reaction to filgrastim, pegfilgrastim, other medicines, foods, dyes, or preservatives °pregnant or trying to get pregnant °breast-feeding °How should I use this medication? °This medicine is for injection under the skin or infusion into a vein. As an infusion into a vein, it is usually given by a health care professional in a hospital or clinic setting. If you get this medicine at home, you will be taught how to prepare and give this medicine. Refer to the Instructions for Use that come with your medication packaging. Use exactly as directed. Take your medicine at regular intervals. Do not take your medicine more often than directed. °It is important that you put your used needles and syringes in a special sharps container. Do not put them in a trash can. If you do not have a sharps container, call your pharmacist  or healthcare provider to get one. °Talk to your pediatrician regarding the use of this medicine in children. While this drug may be prescribed for children as young as 7 months for selected conditions, precautions do apply. °Overdosage: If you think you have taken too much of this medicine contact a poison control center or emergency room at once. °NOTE: This medicine is only for you. Do not share this medicine with others. °What if I miss a dose? °It is important not to miss your dose. Call your doctor or health care professional if you miss a dose. °What may interact with this medication? °This medicine may interact with the following medications: °medicines that may cause a release of neutrophils, such as lithium °This list may not describe all possible interactions. Give your health care provider a list of all the medicines, herbs, non-prescription drugs, or dietary supplements you use. Also tell them if you smoke, drink alcohol, or use illegal drugs. Some items may interact with your medicine. °What should I watch for while using this medication? °Your condition will be monitored carefully while you are receiving this medicine. °You may need blood work done while you are taking this medicine. °Talk to your health care provider about your risk of cancer. You may be more at risk for certain types of cancer if you take this medicine. °What side effects may I notice from receiving this medication? °Side effects that you should report to your doctor or health care professional as soon as possible: °allergic reactions like skin rash, itching or hives, swelling of the face, lips, or tongue °back pain °dizziness or feeling faint °fever °pain, redness, or   irritation at site where injected °pinpoint red spots on the skin °shortness of breath or breathing problems °signs and symptoms of kidney injury like trouble passing urine, change in the amount of urine, or red or dark-brown urine °stomach or side pain, or pain at  the shoulder °swelling °tiredness °unusual bleeding or bruising °Side effects that usually do not require medical attention (report to your doctor or health care professional if they continue or are bothersome): °bone pain °cough °diarrhea °hair loss °headache °muscle pain °This list may not describe all possible side effects. Call your doctor for medical advice about side effects. You may report side effects to FDA at 1-800-FDA-1088. °Where should I keep my medication? °Keep out of the reach of children. °Store in a refrigerator between 2 and 8 degrees C (36 and 46 degrees F). Do not freeze. Keep in carton to protect from light. Throw away this medicine if vials or syringes are left out of the refrigerator for more than 24 hours. Throw away any unused medicine after the expiration date. °NOTE: This sheet is a summary. It may not cover all possible information. If you have questions about this medicine, talk to your doctor, pharmacist, or health care provider. °© 2022 Elsevier/Gold Standard (2021-02-11 00:00:00) ° °

## 2021-07-24 ENCOUNTER — Other Ambulatory Visit: Payer: Self-pay

## 2021-07-24 ENCOUNTER — Inpatient Hospital Stay: Payer: Medicaid Other

## 2021-07-24 VITALS — BP 103/74 | HR 76 | Temp 98.2°F | Resp 18

## 2021-07-24 DIAGNOSIS — Z5111 Encounter for antineoplastic chemotherapy: Secondary | ICD-10-CM | POA: Diagnosis not present

## 2021-07-24 DIAGNOSIS — Z7189 Other specified counseling: Secondary | ICD-10-CM

## 2021-07-24 DIAGNOSIS — C20 Malignant neoplasm of rectum: Secondary | ICD-10-CM

## 2021-07-24 MED ORDER — FILGRASTIM-SNDZ 480 MCG/0.8ML IJ SOSY
480.0000 ug | PREFILLED_SYRINGE | Freq: Once | INTRAMUSCULAR | Status: AC
  Administered 2021-07-24: 480 ug via SUBCUTANEOUS
  Filled 2021-07-24: qty 0.8

## 2021-07-30 ENCOUNTER — Other Ambulatory Visit: Payer: Self-pay

## 2021-07-30 DIAGNOSIS — C787 Secondary malignant neoplasm of liver and intrahepatic bile duct: Secondary | ICD-10-CM

## 2021-07-30 DIAGNOSIS — C20 Malignant neoplasm of rectum: Secondary | ICD-10-CM

## 2021-07-31 ENCOUNTER — Inpatient Hospital Stay (HOSPITAL_BASED_OUTPATIENT_CLINIC_OR_DEPARTMENT_OTHER): Payer: Medicaid Other | Admitting: Hematology

## 2021-07-31 ENCOUNTER — Other Ambulatory Visit: Payer: Self-pay

## 2021-07-31 ENCOUNTER — Other Ambulatory Visit (HOSPITAL_COMMUNITY): Payer: Self-pay

## 2021-07-31 ENCOUNTER — Inpatient Hospital Stay: Payer: Medicaid Other

## 2021-07-31 VITALS — BP 104/62 | HR 83 | Temp 97.5°F | Resp 20 | Wt 176.0 lb

## 2021-07-31 DIAGNOSIS — C20 Malignant neoplasm of rectum: Secondary | ICD-10-CM | POA: Diagnosis not present

## 2021-07-31 DIAGNOSIS — Z7189 Other specified counseling: Secondary | ICD-10-CM

## 2021-07-31 DIAGNOSIS — Z95828 Presence of other vascular implants and grafts: Secondary | ICD-10-CM

## 2021-07-31 DIAGNOSIS — Z5111 Encounter for antineoplastic chemotherapy: Secondary | ICD-10-CM

## 2021-07-31 DIAGNOSIS — C787 Secondary malignant neoplasm of liver and intrahepatic bile duct: Secondary | ICD-10-CM

## 2021-07-31 LAB — CBC WITH DIFFERENTIAL (CANCER CENTER ONLY)
Abs Immature Granulocytes: 0.08 10*3/uL — ABNORMAL HIGH (ref 0.00–0.07)
Basophils Absolute: 0.1 10*3/uL (ref 0.0–0.1)
Basophils Relative: 1 %
Eosinophils Absolute: 0.2 10*3/uL (ref 0.0–0.5)
Eosinophils Relative: 2 %
HCT: 39.3 % (ref 39.0–52.0)
Hemoglobin: 13.1 g/dL (ref 13.0–17.0)
Immature Granulocytes: 1 %
Lymphocytes Relative: 28 %
Lymphs Abs: 2.5 10*3/uL (ref 0.7–4.0)
MCH: 30.7 pg (ref 26.0–34.0)
MCHC: 33.3 g/dL (ref 30.0–36.0)
MCV: 92 fL (ref 80.0–100.0)
Monocytes Absolute: 1.1 10*3/uL — ABNORMAL HIGH (ref 0.1–1.0)
Monocytes Relative: 13 %
Neutro Abs: 4.8 10*3/uL (ref 1.7–7.7)
Neutrophils Relative %: 55 %
Platelet Count: 159 10*3/uL (ref 150–400)
RBC: 4.27 MIL/uL (ref 4.22–5.81)
RDW: 16.3 % — ABNORMAL HIGH (ref 11.5–15.5)
WBC Count: 8.7 10*3/uL (ref 4.0–10.5)
nRBC: 0 % (ref 0.0–0.2)

## 2021-07-31 LAB — IRON AND IRON BINDING CAPACITY (CC-WL,HP ONLY)
Iron: 78 ug/dL (ref 45–182)
Saturation Ratios: 19 % (ref 17.9–39.5)
TIBC: 420 ug/dL (ref 250–450)
UIBC: 342 ug/dL (ref 117–376)

## 2021-07-31 LAB — CMP (CANCER CENTER ONLY)
ALT: 19 U/L (ref 0–44)
AST: 14 U/L — ABNORMAL LOW (ref 15–41)
Albumin: 4.1 g/dL (ref 3.5–5.0)
Alkaline Phosphatase: 122 U/L (ref 38–126)
Anion gap: 6 (ref 5–15)
BUN: 21 mg/dL — ABNORMAL HIGH (ref 6–20)
CO2: 25 mmol/L (ref 22–32)
Calcium: 9.7 mg/dL (ref 8.9–10.3)
Chloride: 108 mmol/L (ref 98–111)
Creatinine: 0.83 mg/dL (ref 0.61–1.24)
GFR, Estimated: >60 mL/min (ref >60)
Glucose, Bld: 109 mg/dL — ABNORMAL HIGH (ref 70–99)
Potassium: 4 mmol/L (ref 3.5–5.1)
Sodium: 139 mmol/L (ref 135–145)
Total Bilirubin: 0.6 mg/dL (ref 0.3–1.2)
Total Protein: 7.1 g/dL (ref 6.5–8.1)

## 2021-07-31 LAB — CEA (IN HOUSE-CHCC): CEA (CHCC-In House): 2.32 ng/mL (ref 0.00–5.00)

## 2021-07-31 LAB — FERRITIN: Ferritin: 142 ng/mL (ref 24–336)

## 2021-07-31 LAB — VITAMIN B12: Vitamin B-12: 1380 pg/mL — ABNORMAL HIGH (ref 180–914)

## 2021-07-31 MED ORDER — ATROPINE SULFATE 1 MG/ML IV SOLN
0.5000 mg | Freq: Once | INTRAVENOUS | Status: AC | PRN
  Administered 2021-07-31: 0.5 mg via INTRAVENOUS
  Filled 2021-07-31: qty 1

## 2021-07-31 MED ORDER — DEXTROSE 5 % IV SOLN: Freq: Once | INTRAVENOUS | Status: AC

## 2021-07-31 MED ORDER — SODIUM CHLORIDE 0.9 % IV SOLN
10.0000 mg | Freq: Once | INTRAVENOUS | Status: AC
  Administered 2021-07-31: 10 mg via INTRAVENOUS
  Filled 2021-07-31: qty 10

## 2021-07-31 MED ORDER — SODIUM CHLORIDE 0.9% FLUSH
10.0000 mL | Freq: Once | INTRAVENOUS | Status: AC
  Administered 2021-07-31: 10 mL

## 2021-07-31 MED ORDER — SODIUM CHLORIDE 0.9 % IV SOLN
180.0000 mg/m2 | Freq: Once | INTRAVENOUS | Status: AC
  Administered 2021-07-31: 360 mg via INTRAVENOUS
  Filled 2021-07-31: qty 18

## 2021-07-31 MED ORDER — SODIUM CHLORIDE 0.9 % IV SOLN
150.0000 mg | Freq: Once | INTRAVENOUS | Status: AC
  Administered 2021-07-31: 150 mg via INTRAVENOUS
  Filled 2021-07-31: qty 150

## 2021-07-31 MED ORDER — SODIUM CHLORIDE 0.9 % IV SOLN
5000.0000 mg | INTRAVENOUS | Status: DC
  Administered 2021-07-31: 5000 mg via INTRAVENOUS
  Filled 2021-07-31: qty 100

## 2021-07-31 MED ORDER — PALONOSETRON HCL INJECTION 0.25 MG/5ML
0.2500 mg | Freq: Once | INTRAVENOUS | Status: AC
  Administered 2021-07-31: 0.25 mg via INTRAVENOUS
  Filled 2021-07-31: qty 5

## 2021-07-31 MED ORDER — SODIUM CHLORIDE 0.9 % IV SOLN
400.0000 mg/m2 | Freq: Once | INTRAVENOUS | Status: AC
  Administered 2021-07-31: 812 mg via INTRAVENOUS
  Filled 2021-07-31: qty 17.5

## 2021-07-31 NOTE — Patient Instructions (Signed)
[facility]   Discharge Instructions: Thank you for choosing [facility] to provide your oncology and hematology care.   If you have a lab appointment with the [facility], please go directly to the [facility] and check in at the registration area.   Wear comfortable clothing and clothing appropriate for easy access to any Portacath or PICC line.   We strive to give you quality time with your provider. You may need to reschedule your appointment if you arrive late (15 or more minutes).  Arriving late affects you and other patients whose appointments are after yours.  Also, if you miss three or more appointments without notifying the office, you may be dismissed from the clinic at the providers discretion.      For prescription refill requests, have your pharmacy contact our office and allow 72 hours for refills to be completed.    Today you received the following chemotherapy and/or immunotherapy agents: irinotecan, leucovorin, fluorouracil      To help prevent nausea and vomiting after your treatment, we encourage you to take your nausea medication as directed.  BELOW ARE SYMPTOMS THAT SHOULD BE REPORTED IMMEDIATELY: *FEVER GREATER THAN 100.4 F (38 C) OR HIGHER *CHILLS OR SWEATING *NAUSEA AND VOMITING THAT IS NOT CONTROLLED WITH YOUR NAUSEA MEDICATION *UNUSUAL SHORTNESS OF BREATH *UNUSUAL BRUISING OR BLEEDING *URINARY PROBLEMS (pain or burning when urinating, or frequent urination) *BOWEL PROBLEMS (unusual diarrhea, constipation, pain near the anus) TENDERNESS IN MOUTH AND THROAT WITH OR WITHOUT PRESENCE OF ULCERS (sore throat, sores in mouth, or a toothache) UNUSUAL RASH, SWELLING OR PAIN  UNUSUAL VAGINAL DISCHARGE OR ITCHING   Items with * indicate a potential emergency and should be followed up as soon as possible or go to the Emergency Department if any problems should occur.  Please show the CHEMOTHERAPY ALERT CARD or  IMMUNOTHERAPY ALERT CARD at check-in to the Emergency Department and triage nurse.  Should you have questions after your visit or need to cancel or reschedule your appointment, please contact [facility]  Dept: [phone]  and follow the prompts.  Office hours are 8:00 a.m. to 4:30 p.m. Monday - Friday. Please note that voicemails left after 4:00 p.m. may not be returned until the following business day.  We are closed weekends and major holidays. You have access to a nurse at all times for urgent questions. Please call the main number to the clinic Dept: [phone] and follow the prompts.   For any non-urgent questions, you may also contact your provider using MyChart. We now offer e-Visits for anyone 58 and older to request care online for non-urgent symptoms. For details visit [email or web address].   Also download the MyChart app! Go to the app store, search "MyChart", open the app, select [facility], and log in with your MyChart username and password.  Due to Covid, a mask is required upon entering the hospital/clinic. If you do not have a mask, one will be given to you upon arrival. For doctor visits, patients may have 1 support person aged 72 or older with them. For treatment visits, patients cannot have anyone with them due to current Covid guidelines and our immunocompromised population.

## 2021-08-02 ENCOUNTER — Other Ambulatory Visit (HOSPITAL_COMMUNITY): Payer: Self-pay

## 2021-08-02 ENCOUNTER — Inpatient Hospital Stay: Payer: Medicaid Other

## 2021-08-02 ENCOUNTER — Other Ambulatory Visit: Payer: Self-pay

## 2021-08-02 VITALS — BP 101/70 | HR 71 | Temp 97.8°F

## 2021-08-02 DIAGNOSIS — Z5111 Encounter for antineoplastic chemotherapy: Secondary | ICD-10-CM | POA: Diagnosis not present

## 2021-08-02 MED ORDER — HEPARIN SOD (PORK) LOCK FLUSH 100 UNIT/ML IV SOLN
500.0000 [IU] | Freq: Once | INTRAVENOUS | Status: AC | PRN
  Administered 2021-08-02: 500 [IU]

## 2021-08-02 MED ORDER — SODIUM CHLORIDE 0.9% FLUSH
10.0000 mL | INTRAVENOUS | Status: DC | PRN
  Administered 2021-08-02: 10 mL

## 2021-08-04 ENCOUNTER — Other Ambulatory Visit: Payer: Self-pay

## 2021-08-04 ENCOUNTER — Inpatient Hospital Stay: Payer: Medicaid Other

## 2021-08-04 ENCOUNTER — Other Ambulatory Visit (HOSPITAL_COMMUNITY): Payer: Self-pay

## 2021-08-04 VITALS — BP 99/71 | HR 72 | Temp 98.9°F

## 2021-08-04 DIAGNOSIS — Z5111 Encounter for antineoplastic chemotherapy: Secondary | ICD-10-CM | POA: Diagnosis not present

## 2021-08-04 DIAGNOSIS — Z7189 Other specified counseling: Secondary | ICD-10-CM

## 2021-08-04 DIAGNOSIS — C20 Malignant neoplasm of rectum: Secondary | ICD-10-CM

## 2021-08-04 DIAGNOSIS — C787 Secondary malignant neoplasm of liver and intrahepatic bile duct: Secondary | ICD-10-CM

## 2021-08-04 MED ORDER — FILGRASTIM-SNDZ 480 MCG/0.8ML IJ SOSY
480.0000 ug | PREFILLED_SYRINGE | Freq: Once | INTRAMUSCULAR | Status: AC
  Administered 2021-08-04: 480 ug via SUBCUTANEOUS
  Filled 2021-08-04: qty 0.8

## 2021-08-04 NOTE — Patient Instructions (Signed)
Filgrastim, G-CSF injection °What is this medication? °FILGRASTIM, G-CSF (fil GRA stim) is a granulocyte colony-stimulating factor that stimulates the growth of neutrophils, a type of white blood cell (WBC) important in the body's fight against infection. It is used to reduce the incidence of fever and infection in patients with certain types of cancer who are receiving chemotherapy that affects the bone marrow, to stimulate blood cell production for removal of WBCs from the body prior to a bone marrow transplantation, to reduce the incidence of fever and infection in patients who have severe chronic neutropenia, and to improve survival outcomes following high-dose radiation exposure that is toxic to the bone marrow. °This medicine may be used for other purposes; ask your health care provider or pharmacist if you have questions. °COMMON BRAND NAME(S): Neupogen, Nivestym, Releuko, Zarxio °What should I tell my care team before I take this medication? °They need to know if you have any of these conditions: °kidney disease °latex allergy °ongoing radiation therapy °sickle cell disease °an unusual or allergic reaction to filgrastim, pegfilgrastim, other medicines, foods, dyes, or preservatives °pregnant or trying to get pregnant °breast-feeding °How should I use this medication? °This medicine is for injection under the skin or infusion into a vein. As an infusion into a vein, it is usually given by a health care professional in a hospital or clinic setting. If you get this medicine at home, you will be taught how to prepare and give this medicine. Refer to the Instructions for Use that come with your medication packaging. Use exactly as directed. Take your medicine at regular intervals. Do not take your medicine more often than directed. °It is important that you put your used needles and syringes in a special sharps container. Do not put them in a trash can. If you do not have a sharps container, call your pharmacist  or healthcare provider to get one. °Talk to your pediatrician regarding the use of this medicine in children. While this drug may be prescribed for children as young as 7 months for selected conditions, precautions do apply. °Overdosage: If you think you have taken too much of this medicine contact a poison control center or emergency room at once. °NOTE: This medicine is only for you. Do not share this medicine with others. °What if I miss a dose? °It is important not to miss your dose. Call your doctor or health care professional if you miss a dose. °What may interact with this medication? °This medicine may interact with the following medications: °medicines that may cause a release of neutrophils, such as lithium °This list may not describe all possible interactions. Give your health care provider a list of all the medicines, herbs, non-prescription drugs, or dietary supplements you use. Also tell them if you smoke, drink alcohol, or use illegal drugs. Some items may interact with your medicine. °What should I watch for while using this medication? °Your condition will be monitored carefully while you are receiving this medicine. °You may need blood work done while you are taking this medicine. °Talk to your health care provider about your risk of cancer. You may be more at risk for certain types of cancer if you take this medicine. °What side effects may I notice from receiving this medication? °Side effects that you should report to your doctor or health care professional as soon as possible: °allergic reactions like skin rash, itching or hives, swelling of the face, lips, or tongue °back pain °dizziness or feeling faint °fever °pain, redness, or   irritation at site where injected °pinpoint red spots on the skin °shortness of breath or breathing problems °signs and symptoms of kidney injury like trouble passing urine, change in the amount of urine, or red or dark-brown urine °stomach or side pain, or pain at  the shoulder °swelling °tiredness °unusual bleeding or bruising °Side effects that usually do not require medical attention (report to your doctor or health care professional if they continue or are bothersome): °bone pain °cough °diarrhea °hair loss °headache °muscle pain °This list may not describe all possible side effects. Call your doctor for medical advice about side effects. You may report side effects to FDA at 1-800-FDA-1088. °Where should I keep my medication? °Keep out of the reach of children. °Store in a refrigerator between 2 and 8 degrees C (36 and 46 degrees F). Do not freeze. Keep in carton to protect from light. Throw away this medicine if vials or syringes are left out of the refrigerator for more than 24 hours. Throw away any unused medicine after the expiration date. °NOTE: This sheet is a summary. It may not cover all possible information. If you have questions about this medicine, talk to your doctor, pharmacist, or health care provider. °© 2022 Elsevier/Gold Standard (2021-02-11 00:00:00) ° °

## 2021-08-05 ENCOUNTER — Inpatient Hospital Stay: Payer: Medicaid Other

## 2021-08-05 VITALS — BP 100/76 | HR 77 | Temp 97.7°F | Resp 20

## 2021-08-05 DIAGNOSIS — Z7189 Other specified counseling: Secondary | ICD-10-CM

## 2021-08-05 DIAGNOSIS — Z5111 Encounter for antineoplastic chemotherapy: Secondary | ICD-10-CM | POA: Diagnosis not present

## 2021-08-05 DIAGNOSIS — C20 Malignant neoplasm of rectum: Secondary | ICD-10-CM

## 2021-08-05 MED ORDER — FILGRASTIM-SNDZ 480 MCG/0.8ML IJ SOSY
480.0000 ug | PREFILLED_SYRINGE | Freq: Once | INTRAMUSCULAR | Status: AC
  Administered 2021-08-05: 480 ug via SUBCUTANEOUS
  Filled 2021-08-05: qty 0.8

## 2021-08-06 ENCOUNTER — Ambulatory Visit: Payer: Self-pay | Admitting: Surgery

## 2021-08-06 ENCOUNTER — Other Ambulatory Visit: Payer: Self-pay

## 2021-08-06 ENCOUNTER — Other Ambulatory Visit: Payer: Self-pay | Admitting: Hematology

## 2021-08-06 ENCOUNTER — Encounter: Payer: Self-pay | Admitting: Hematology

## 2021-08-06 ENCOUNTER — Inpatient Hospital Stay: Payer: Medicaid Other | Attending: Hematology

## 2021-08-06 ENCOUNTER — Other Ambulatory Visit: Payer: Self-pay | Admitting: Surgery

## 2021-08-06 VITALS — BP 99/75 | HR 79 | Temp 98.5°F | Resp 18

## 2021-08-06 DIAGNOSIS — C787 Secondary malignant neoplasm of liver and intrahepatic bile duct: Secondary | ICD-10-CM

## 2021-08-06 DIAGNOSIS — Z5189 Encounter for other specified aftercare: Secondary | ICD-10-CM | POA: Diagnosis not present

## 2021-08-06 DIAGNOSIS — C7800 Secondary malignant neoplasm of unspecified lung: Secondary | ICD-10-CM | POA: Diagnosis not present

## 2021-08-06 DIAGNOSIS — C20 Malignant neoplasm of rectum: Secondary | ICD-10-CM

## 2021-08-06 DIAGNOSIS — Z7189 Other specified counseling: Secondary | ICD-10-CM

## 2021-08-06 DIAGNOSIS — C775 Secondary and unspecified malignant neoplasm of intrapelvic lymph nodes: Secondary | ICD-10-CM | POA: Diagnosis not present

## 2021-08-06 MED ORDER — FILGRASTIM-SNDZ 480 MCG/0.8ML IJ SOSY
480.0000 ug | PREFILLED_SYRINGE | Freq: Once | INTRAMUSCULAR | Status: AC
  Administered 2021-08-06: 480 ug via SUBCUTANEOUS
  Filled 2021-08-06: qty 0.8

## 2021-08-06 NOTE — Patient Instructions (Signed)
Filgrastim, G-CSF injection °What is this medication? °FILGRASTIM, G-CSF (fil GRA stim) is a granulocyte colony-stimulating factor that stimulates the growth of neutrophils, a type of white blood cell (WBC) important in the body's fight against infection. It is used to reduce the incidence of fever and infection in patients with certain types of cancer who are receiving chemotherapy that affects the bone marrow, to stimulate blood cell production for removal of WBCs from the body prior to a bone marrow transplantation, to reduce the incidence of fever and infection in patients who have severe chronic neutropenia, and to improve survival outcomes following high-dose radiation exposure that is toxic to the bone marrow. °This medicine may be used for other purposes; ask your health care provider or pharmacist if you have questions. °COMMON BRAND NAME(S): Neupogen, Nivestym, Releuko, Zarxio °What should I tell my care team before I take this medication? °They need to know if you have any of these conditions: °kidney disease °latex allergy °ongoing radiation therapy °sickle cell disease °an unusual or allergic reaction to filgrastim, pegfilgrastim, other medicines, foods, dyes, or preservatives °pregnant or trying to get pregnant °breast-feeding °How should I use this medication? °This medicine is for injection under the skin or infusion into a vein. As an infusion into a vein, it is usually given by a health care professional in a hospital or clinic setting. If you get this medicine at home, you will be taught how to prepare and give this medicine. Refer to the Instructions for Use that come with your medication packaging. Use exactly as directed. Take your medicine at regular intervals. Do not take your medicine more often than directed. °It is important that you put your used needles and syringes in a special sharps container. Do not put them in a trash can. If you do not have a sharps container, call your pharmacist  or healthcare provider to get one. °Talk to your pediatrician regarding the use of this medicine in children. While this drug may be prescribed for children as young as 7 months for selected conditions, precautions do apply. °Overdosage: If you think you have taken too much of this medicine contact a poison control center or emergency room at once. °NOTE: This medicine is only for you. Do not share this medicine with others. °What if I miss a dose? °It is important not to miss your dose. Call your doctor or health care professional if you miss a dose. °What may interact with this medication? °This medicine may interact with the following medications: °medicines that may cause a release of neutrophils, such as lithium °This list may not describe all possible interactions. Give your health care provider a list of all the medicines, herbs, non-prescription drugs, or dietary supplements you use. Also tell them if you smoke, drink alcohol, or use illegal drugs. Some items may interact with your medicine. °What should I watch for while using this medication? °Your condition will be monitored carefully while you are receiving this medicine. °You may need blood work done while you are taking this medicine. °Talk to your health care provider about your risk of cancer. You may be more at risk for certain types of cancer if you take this medicine. °What side effects may I notice from receiving this medication? °Side effects that you should report to your doctor or health care professional as soon as possible: °allergic reactions like skin rash, itching or hives, swelling of the face, lips, or tongue °back pain °dizziness or feeling faint °fever °pain, redness, or   irritation at site where injected °pinpoint red spots on the skin °shortness of breath or breathing problems °signs and symptoms of kidney injury like trouble passing urine, change in the amount of urine, or red or dark-brown urine °stomach or side pain, or pain at  the shoulder °swelling °tiredness °unusual bleeding or bruising °Side effects that usually do not require medical attention (report to your doctor or health care professional if they continue or are bothersome): °bone pain °cough °diarrhea °hair loss °headache °muscle pain °This list may not describe all possible side effects. Call your doctor for medical advice about side effects. You may report side effects to FDA at 1-800-FDA-1088. °Where should I keep my medication? °Keep out of the reach of children. °Store in a refrigerator between 2 and 8 degrees C (36 and 46 degrees F). Do not freeze. Keep in carton to protect from light. Throw away this medicine if vials or syringes are left out of the refrigerator for more than 24 hours. Throw away any unused medicine after the expiration date. °NOTE: This sheet is a summary. It may not cover all possible information. If you have questions about this medicine, talk to your doctor, pharmacist, or health care provider. °© 2022 Elsevier/Gold Standard (2021-02-11 00:00:00) ° °

## 2021-08-06 NOTE — H&P (View-Only) (Signed)
History of Present Illness: ?James Coffey is a 51 y.o. male who is seen today for follow up of rectal cancer with liver metastases. Since his last visit, he has had a total of 5 cycles FOLFIRINOX, however oxaliplatin was held for the last cycle.  In total, he has had 8 cycles oxaliplatin.  He is tolerating chemotherapy quite well.  His weight is stable and he denies nausea or vomiting.  He is overall feeling well.  Colostomy is functioning well.  His last infusion was 6 days ago.  He recently had restaging scans done, which showed decrease in size of the primary rectal tumor and overall stable size of the 2 liver lesions.  He also has a 7 mm lung nodule which is stable from prior scans.  ?  ?I had a discussion with him today with the assistance of his daughter via phone who served as an Astronomer. ?  ?Review of Systems: ?A complete review of systems was obtained from the patient.  I have reviewed this information and discussed as appropriate with the patient.  See HPI as well for other ROS. ?  ?  ?  ?Medical History: ?Past Medical History ?Past Medical History: ?Diagnosis Date ? History of cancer   ? ?  ?  ?Patient Active Problem List ?Diagnosis ? Cellulitis ? Sepsis (CMS-HCC) ? Fournier gangrene ? Malnutrition of moderate degree (CMS-HCC) ? Non-English speaking patient ? Perianal abscess ? Perirectal abscess ? Proctitis ? Rectal cancer (CMS-HCC) ? Stricture of rectum ? Tobacco abuse ?  ?  ?Past Surgical History ?Past Surgical History: ?Procedure Laterality Date ? INGUINAL HERNIA REPAIR Right 2007 ? Laparoscopic loop colostomy   12/13/2020 ? ?  ?  ?Allergies ?Allergies ?Allergen Reactions ? Hydrocodone Itching ? ?  ?  ?Current Outpatient Medications on File Prior to Visit ?Medication Sig Dispense Refill ? bacitracin ointment Apply topically     ? methocarbamoL (ROBAXIN) 500 MG tablet Take by mouth     ? midodrine (PROAMATINE) 5 MG tablet Take by mouth     ? oxyCODONE (ROXICODONE) 5 MG immediate release tablet Take  by mouth     ? pantoprazole (PROTONIX) 40 MG DR tablet TAKE 1 BY MOUTH AT BEDTIME     ? potassium chloride (KLOR-CON) 20 MEQ ER tablet Take 20 mEq by mouth once daily     ?  ?No current facility-administered medications on file prior to visit. ?  ?  ?Family History ?History reviewed. No pertinent family history. ?  ?  ?Social History ?  ?Tobacco Use ?Smoking Status Every Day ?Smokeless Tobacco Never ?  ?  ?Social History ?Social History ?  ? ?Socioeconomic History ? Marital status: Unknown ?Tobacco Use ? Smoking status: Every Day ? Smokeless tobacco: Never ?Vaping Use ? Vaping Use: Never used ?Substance and Sexual Activity ? Alcohol use: Never ? Drug use: Never ? ?  ?  ?Objective: ?  ?  ?Vitals: ?  08/06/21 1034 ?BP: 122/68 ?Pulse: 94 ?Temp: 36.8 ?C (98.2 ?F) ?SpO2: 98% ?Weight: 78.7 kg (173 lb 9.6 oz) ?Height: 175.3 cm (5\' 9" ) ?  ?Body mass index is 25.64 kg/m?. ?  ?Physical Exam ?Vitals reviewed.  ?Constitutional:   ?   Appearance: Normal appearance.  ?Eyes:  ?   General: No scleral icterus. ?   Conjunctiva/sclera: Conjunctivae normal.  ?Cardiovascular:  ?   Rate and Rhythm: Normal rate and regular rhythm.  ?Pulmonary:  ?   Effort: Pulmonary effort is normal. No respiratory distress.  ?Abdominal:  ?  General: There is no distension.  ?   Palpations: Abdomen is soft.  ?   Tenderness: There is no abdominal tenderness.  ?   Comments: Well-healed midline surgical scar.  Colostomy in place left upper quadrant with parastomal hernia, soft and nontender.  ?Musculoskeletal:     ?   General: No swelling or deformity. Normal range of motion.  ?Skin: ?   General: Skin is warm and dry.  ?Neurological:  ?   General: No focal deficit present.  ?   Mental Status: He is alert and oriented to person, place, and time.  ?Psychiatric:     ?   Mood and Affect: Mood normal.     ?   Behavior: Behavior normal.     ?   Thought Content: Thought content normal.  ?  ?  ?  ?  ?  ?Labs, Imaging and Diagnostic Testing: ?  ?CT chest  07/15/21: ?IMPRESSION: ?1. Solitary right upper lobe pulmonary nodule appears unchanged from ?the most recent CT 3 months ago, although appears new from study of ?7 months ago and could reflect a stable solitary metastasis. ?Attention on follow-up recommended. ?2. No new nodules, adenopathy or other evidence of thoracic ?metastatic disease. ?  ?MRI pelvis: ?IMPRESSION: ?Considerable decrease in bulkiness of low rectal tumor since prior ?study. Adjacent spiculated densities in the low perirectal fat are ?consistent with post treatment fibrosis, although some residual ?tumor cannot be excluded. ?Decreased perirectal lymphadenopathy since prior exam. ?No new or progressive disease identified. ?  ?MRI abdomen: ?IMPRESSION: ?1. Motion degraded exam, especially the pre and postcontrast dynamic ?series. Therefore, analysis primarily performed on the noncontrast ?images. ?2. The right hepatic lobe lesion measures slightly smaller today. ?The left hepatic lobe lesion measures similarly and may extend ?slightly more caudally today. Especially this lesion demonstrates ?residual restricted diffusion, suggesting viable metastasis. No new ?lesions identified. ?  ?  ?Assessment and Plan: ?   ?Diagnoses and all orders for this visit: ?  ?Rectal cancer metastasized to liver (CMS-HCC) ?  ?  ?  ?This is a 51 year old male with rectal cancer with metastatic disease to the liver.  He has now completed 4 cycles FOLFIRINOX.  His case was discussed at multidisciplinary tumor board this morning, I have personally reviewed his imaging.  Dr. [name] feels that the rectal primary will be amenable to resection, but we have agreed to proceed with treatment of the liver disease first.  The lesion in the right lobe of the liver is decreased in size after treatment with FOLFIRINOX, and per radiology review appears to have very minimal if any viable tumor.  Lesion in the left lateral section is stable in size and does appear to have viable tumor  remaining.  The right-sided lesion is rather deep and based on its location would likely require a formal right hepatectomy to remove.  In addition resection of the left-sided tumor will need to be performed which would likely leave the patient with an adequate future liver remnant especially given previous treatment with oxaliplatin-based chemotherapy.  Discussed with IR, and the right liver lesion is likely amenable to ablation.  I think the best treatment for this patient after multidisciplinary review is resection of the left lateral lesion via a left lateral hepatectomy, with ablation of the right-sided lesion, especially considering the right-sided tumor had a better response to chemotherapy.  I reviewed with this plan with the patient and his daughter and they are agreeable to proceeding with surgery.  I will plan to  perform a staging laparoscopy and an open left lateral hepatectomy (presence of colostomy will make a laparoscopic-assisted approach difficult).  I reviewed the risks and benefits of surgery including the risk of bleeding and small risk of posthepatectomy liver failure.  He expressed understanding and agrees to proceed with surgery.  I have placed an urgent referral to IR.  If percutaneous approach to ablation cannot be performed, we can try to coordinate an intraoperative ablation. ?  ?I discussed this case with Dr. Irene Limbo today and have requested to hold any further chemotherapy to prevent any further hepatotoxicity.  I will schedule the patient for surgery on a date that is 4 weeks from his last infusion, which will be in late March. After resection of the liver disease, patient will then proceed with treatment of his rectal cancer. Lung nodule is <1cm and too small to characterize, and is stable compared to prior. Will continue to monitor. ? ?Michaelle Birks, MD ?[facility] ?General, Hepatobiliary and Pancreatic Surgery ?08/06/21 12:16 PM ? ? ?

## 2021-08-06 NOTE — Progress Notes (Signed)
The proposed treatment discussed in conference is for discussion purpose only and is not a binding recommendation.  The patients have not been physically examined, or presented with their treatment options.  Therefore, final treatment plans cannot be decided.  

## 2021-08-06 NOTE — H&P (Signed)
History of Present Illness: ?James Coffey is a 51 y.o. male who is seen today for follow up of rectal cancer with liver metastases. Since his last visit, he has had a total of 5 cycles FOLFIRINOX, however oxaliplatin was held for the last cycle.  In total, he has had 8 cycles oxaliplatin.  He is tolerating chemotherapy quite well.  His weight is stable and he denies nausea or vomiting.  He is overall feeling well.  Colostomy is functioning well.  His last infusion was 6 days ago.  He recently had restaging scans done, which showed decrease in size of the primary rectal tumor and overall stable size of the 2 liver lesions.  He also has a 7 mm lung nodule which is stable from prior scans.  ?  ?I had a discussion with him today with the assistance of his daughter via phone who served as an Astronomer. ?  ?Review of Systems: ?A complete review of systems was obtained from the patient.  I have reviewed this information and discussed as appropriate with the patient.  See HPI as well for other ROS. ?  ?  ?  ?Medical History: ?Past Medical History ?Past Medical History: ?Diagnosis Date ? History of cancer   ? ?  ?  ?Patient Active Problem List ?Diagnosis ? Cellulitis ? Sepsis (CMS-HCC) ? Fournier gangrene ? Malnutrition of moderate degree (CMS-HCC) ? Non-English speaking patient ? Perianal abscess ? Perirectal abscess ? Proctitis ? Rectal cancer (CMS-HCC) ? Stricture of rectum ? Tobacco abuse ?  ?  ?Past Surgical History ?Past Surgical History: ?Procedure Laterality Date ? INGUINAL HERNIA REPAIR Right 2007 ? Laparoscopic loop colostomy   12/13/2020 ? ?  ?  ?Allergies ?Allergies ?Allergen Reactions ? Hydrocodone Itching ? ?  ?  ?Current Outpatient Medications on File Prior to Visit ?Medication Sig Dispense Refill ? bacitracin ointment Apply topically     ? methocarbamoL (ROBAXIN) 500 MG tablet Take by mouth     ? midodrine (PROAMATINE) 5 MG tablet Take by mouth     ? oxyCODONE (ROXICODONE) 5 MG immediate release tablet Take  by mouth     ? pantoprazole (PROTONIX) 40 MG DR tablet TAKE 1 BY MOUTH AT BEDTIME     ? potassium chloride (KLOR-CON) 20 MEQ ER tablet Take 20 mEq by mouth once daily     ?  ?No current facility-administered medications on file prior to visit. ?  ?  ?Family History ?History reviewed. No pertinent family history. ?  ?  ?Social History ?  ?Tobacco Use ?Smoking Status Every Day ?Smokeless Tobacco Never ?  ?  ?Social History ?Social History ?  ? ?Socioeconomic History ? Marital status: Unknown ?Tobacco Use ? Smoking status: Every Day ? Smokeless tobacco: Never ?Vaping Use ? Vaping Use: Never used ?Substance and Sexual Activity ? Alcohol use: Never ? Drug use: Never ? ?  ?  ?Objective: ?  ?  ?Vitals: ?  08/06/21 1034 ?BP: 122/68 ?Pulse: 94 ?Temp: 36.8 ?C (98.2 ?F) ?SpO2: 98% ?Weight: 78.7 kg (173 lb 9.6 oz) ?Height: 175.3 cm (5\' 9" ) ?  ?Body mass index is 25.64 kg/m?. ?  ?Physical Exam ?Vitals reviewed.  ?Constitutional:   ?   Appearance: Normal appearance.  ?Eyes:  ?   General: No scleral icterus. ?   Conjunctiva/sclera: Conjunctivae normal.  ?Cardiovascular:  ?   Rate and Rhythm: Normal rate and regular rhythm.  ?Pulmonary:  ?   Effort: Pulmonary effort is normal. No respiratory distress.  ?Abdominal:  ?  General: There is no distension.  ?   Palpations: Abdomen is soft.  ?   Tenderness: There is no abdominal tenderness.  ?   Comments: Well-healed midline surgical scar.  Colostomy in place left upper quadrant with parastomal hernia, soft and nontender.  ?Musculoskeletal:     ?   General: No swelling or deformity. Normal range of motion.  ?Skin: ?   General: Skin is warm and dry.  ?Neurological:  ?   General: No focal deficit present.  ?   Mental Status: He is alert and oriented to person, place, and time.  ?Psychiatric:     ?   Mood and Affect: Mood normal.     ?   Behavior: Behavior normal.     ?   Thought Content: Thought content normal.  ?  ?  ?  ?  ?  ?Labs, Imaging and Diagnostic Testing: ?  ?CT chest  07/15/21: ?IMPRESSION: ?1. Solitary right upper lobe pulmonary nodule appears unchanged from ?the most recent CT 3 months ago, although appears new from study of ?7 months ago and could reflect a stable solitary metastasis. ?Attention on follow-up recommended. ?2. No new nodules, adenopathy or other evidence of thoracic ?metastatic disease. ?  ?MRI pelvis: ?IMPRESSION: ?Considerable decrease in bulkiness of low rectal tumor since prior ?study. Adjacent spiculated densities in the low perirectal fat are ?consistent with post treatment fibrosis, although some residual ?tumor cannot be excluded. ?Decreased perirectal lymphadenopathy since prior exam. ?No new or progressive disease identified. ?  ?MRI abdomen: ?IMPRESSION: ?1. Motion degraded exam, especially the pre and postcontrast dynamic ?series. Therefore, analysis primarily performed on the noncontrast ?images. ?2. The right hepatic lobe lesion measures slightly smaller today. ?The left hepatic lobe lesion measures similarly and may extend ?slightly more caudally today. Especially this lesion demonstrates ?residual restricted diffusion, suggesting viable metastasis. No new ?lesions identified. ?  ?  ?Assessment and Plan: ?   ?Diagnoses and all orders for this visit: ?  ?Rectal cancer metastasized to liver (CMS-HCC) ?  ?  ?  ?This is a 51 year old male with rectal cancer with metastatic disease to the liver.  He has now completed 4 cycles FOLFIRINOX.  His case was discussed at multidisciplinary tumor board this morning, I have personally reviewed his imaging.  Dr. [name] feels that the rectal primary will be amenable to resection, but we have agreed to proceed with treatment of the liver disease first.  The lesion in the right lobe of the liver is decreased in size after treatment with FOLFIRINOX, and per radiology review appears to have very minimal if any viable tumor.  Lesion in the left lateral section is stable in size and does appear to have viable tumor  remaining.  The right-sided lesion is rather deep and based on its location would likely require a formal right hepatectomy to remove.  In addition resection of the left-sided tumor will need to be performed which would likely leave the patient with an adequate future liver remnant especially given previous treatment with oxaliplatin-based chemotherapy.  Discussed with IR, and the right liver lesion is likely amenable to ablation.  I think the best treatment for this patient after multidisciplinary review is resection of the left lateral lesion via a left lateral hepatectomy, with ablation of the right-sided lesion, especially considering the right-sided tumor had a better response to chemotherapy.  I reviewed with this plan with the patient and his daughter and they are agreeable to proceeding with surgery.  I will plan to  perform a staging laparoscopy and an open left lateral hepatectomy (presence of colostomy will make a laparoscopic-assisted approach difficult).  I reviewed the risks and benefits of surgery including the risk of bleeding and small risk of posthepatectomy liver failure.  He expressed understanding and agrees to proceed with surgery.  I have placed an urgent referral to IR.  If percutaneous approach to ablation cannot be performed, we can try to coordinate an intraoperative ablation. ?  ?I discussed this case with Dr. Irene Limbo today and have requested to hold any further chemotherapy to prevent any further hepatotoxicity.  I will schedule the patient for surgery on a date that is 4 weeks from his last infusion, which will be in late March. After resection of the liver disease, patient will then proceed with treatment of his rectal cancer. Lung nodule is <1cm and too small to characterize, and is stable compared to prior. Will continue to monitor. ? ?Michaelle Birks, MD ?[facility] ?General, Hepatobiliary and Pancreatic Surgery ?08/06/21 12:16 PM ? ? ?

## 2021-08-06 NOTE — Progress Notes (Signed)
[place]   Telephone:([phone] Fax:(336) [phone]   Clinic Follow up Note   Patient Care Team: [name], MD as PCP - General (Hematology) [name], MD as Consulting Physician (Urology) [name], MD as Consulting Physician (Gastroenterology) [name], MD as Consulting Physician (Colon and Rectal Surgery) [name], MD as Consulting Physician (Oncology) Royston Bake, RN as Oncology Nurse Navigator (Oncology)  Date of Service:  07/31/2021  CHIEF COMPLAINT:  Follow-up for continued evaluation and management of rectal cancer prior to cycle 6 of FOLFIRI chemotherapy.  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[place])   01/13/2021 Cancer Staging   Staging form: Colon and Rectum, AJCC 8th Edition - Clinical stage from 01/13/2021: Stage IIIB (cT3, cN2a, cM0) - Signed by [name], MD on 01/19/2021 Stage prefix: Initial diagnosis Histologic grade (G): G2 Histologic grading system: 4 grade system    01/20/2021 - 03/26/2021 Chemotherapy   Patient is on Treatment Plan : RECTAL Xelox (Capeox) q21d x 6 cycles     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   INTERVAL HISTORY:   James Coffey is here for follow-up of his cycle 6 with FOLFIRI.  He notes no acute new symptoms since his last clinic visit.  His CT of the chest and MRI of the abdomen and pelvis were reviewed. We discussed neck steps and he has been  referred to surgery and is to see Dr. [name] at [facility] surgery. No new toxicities from his previous chemotherapy. Labs from today reviewed and are stable. CEA level is down to 2.32 No abdominal pain no fevers no chills no night sweats no significant new neuropathy. He is enthusiastic about considering surgery.  MEDICAL HISTORY:  Past Medical History:  Diagnosis Date   Cancer ([place])    Tobacco abuse     SURGICAL HISTORY: Past Surgical History:  Procedure Laterality Date   HERNIA REPAIR     INCISION AND DRAINAGE ABSCESS N/A 12/01/2020   Procedure: INCISION AND DRAINAGE ABSCESS;  Surgeon: Michael Boston, MD;  Location: [facility];  Service: General;  Laterality: N/A;   INCISION AND DRAINAGE ABSCESS N/A 12/04/2020   Procedure: INCISION AND DRAINAGE FOURNIERS GANGRENE;  Surgeon: [name], MD;  Location: [facility];  Service: General;  Laterality: N/A;   INCISION AND DRAINAGE PERIRECTAL ABSCESS N/A 11/30/2020   Procedure: INCISION AND DEBRIDEMENT OF PERIRECTAL ABSCESS AND RECTAL WALL BIOPSIES;  Surgeon: Michael Boston, MD;  Location: [facility];  Service: General;  Laterality: N/A;   INGUINAL HERNIA REPAIR Right 2007   IR IMAGING GUIDED PORT INSERTION  05/07/2021   LAPAROSCOPIC LOOP COLOSTOMY N/A 12/13/2020   Procedure: DIAGNOSTIC LAPAROSCOPY WITH  OPEN DESCENDING LOOP COLOSTOMY;  Surgeon: Armandina Gemma, MD;  Location: [facility];  Service: General;  Laterality: N/A;   RECTAL SURGERY      I have reviewed the social history and family history with the patient and they are unchanged from previous note.  ALLERGIES:  is allergic to hydrocodone.  ROS . 10  Point review of Systems was done is negative except as noted above.   MEDICATIONS:  Current Outpatient Medications  Medication Sig Dispense Refill   acetaminophen (TYLENOL) 500 MG tablet Take 2 tablets (1,000 mg total) by mouth every 8 (eight) hours as needed for mild pain or fever.     bacitracin ointment Apply topically daily. Apply to  lateral flank wounds 120 g 0   dexamethasone (DECADRON) 4 MG tablet Take 2 tablets (8 mg total) by mouth daily. Start the day after chemotherapy for 3 days. Take with food. 8 tablet 5   dronabinol (MARINOL) 2.5 MG capsule Take 1 capsule by mouth 2 times daily before a meal. 60 capsule 0   ferrous sulfate 325 (65 FE) MG tablet Take 1 tablet (325 mg total) by mouth daily with breakfast. 30 tablet 0   ibuprofen (ADVIL) 600 MG tablet Take 1 tablet (600 mg total) by mouth 3 (three) times daily. 30 tablet 0   lidocaine-prilocaine (EMLA) cream Apply to affected area once 30 g 3   loperamide (IMODIUM) 2 MG capsule Take 2 tablets by mouth at onset of diarrhea, then 1 tab every 2hrs until 12hr without a BM. May take 2 tab every 4hrs at bedtime. If diarrhea recurs repeat. 30 capsule 1   LORazepam (ATIVAN) 0.5 MG tablet Take 1 tablet by mouth every 6 hours as needed for anxiety. 30 tablet 0   methocarbamol (ROBAXIN) 500 MG tablet Take 1 tablet (500 mg total) by mouth every 8 (eight) hours as needed for muscle spasms. 30 tablet 0   ondansetron (ZOFRAN) 8 MG tablet Take 1 tablet (8 mg total) by mouth 2 (two) times daily as needed. Start on day 3 after chemotherapy. 30 tablet 1   potassium chloride SA (KLOR-CON) 20 MEQ tablet Take 1 tablet (20 mEq total) by mouth daily for 14 days. 14 tablet 0   prochlorperazine (COMPAZINE) 10 MG tablet Take 1 tablet (10 mg total) by mouth every 6 (six) hours as needed for nausea or vomiting. 30 tablet 1   traMADol (ULTRAM) 50 MG tablet Take 1 tablet by mouth every 8 hours as needed. 30 tablet 0   No current facility-administered medications for this visit.   Facility-Administered Medications Ordered in Other Visits  Medication Dose Route Frequency Provider Last Rate Last Admin   palonosetron (ALOXI) 0.25 MG/5ML injection            sodium chloride flush (NS) 0.9 % injection 10 mL  10 mL Intracatheter Once [name], MD        PHYSICAL EXAMINATION: ECOG  PERFORMANCE STATUS: 1 - Symptomatic but completely ambulatory  Vital signs reviewed Wt Readings from Last 3 Encounters:  07/31/21 176 lb (79.8 kg)  07/17/21 177 lb 8 oz (80.5 kg)  07/03/21 176 lb 12.8 oz (80.2 kg)  NAD GENERAL:alert, in no acute distress and comfortable SKIN: no acute rashes, no significant lesions EYES: conjunctiva are pink and non-injected, sclera anicteric OROPHARYNX: MMM, no exudates, no oropharyngeal erythema or ulceration NECK: supple, no JVD LYMPH:  no palpable lymphadenopathy in the cervical, axillary or inguinal regions LUNGS: clear to auscultation b/l with normal respiratory effort HEART: regular rate & rhythm ABDOMEN:  normoactive bowel sounds , non tender, not distended. Extremity: no pedal edema PSYCH: alert & oriented x 3 with fluent speech NEURO: no focal motor/sensory deficits   LABORATORY DATA:  . CBC Latest Ref Rng & Units 07/31/2021 07/17/2021 07/03/2021  WBC 4.0 - 10.5 K/uL 8.7 5.6 5.2  Hemoglobin  13.0 - 17.0 g/dL 13.1 13.0 12.3(L)  Hematocrit 39.0 - 52.0 % 39.3 38.5(L) 36.9(L)  Platelets 150 - 400 K/uL 159 138(L) 150   . CMP Latest Ref Rng & Units 07/31/2021 07/17/2021 07/03/2021  Glucose 70 - 99 mg/dL 109(H) 132(H) 144(H)  BUN 6 - 20 mg/dL 21(H) 11 11  Creatinine 0.61 - 1.24 mg/dL 0.83 0.79 0.73  Sodium 135 - 145 mmol/L 139 139 139  Potassium 3.5 - 5.1 mmol/L 4.0 3.8 3.6  Chloride 98 - 111 mmol/L 108 107 108  CO2 22 - 32 mmol/L '25 25 27  ' Calcium 8.9 - 10.3 mg/dL 9.7 9.4 9.4  Total Protein 6.5 - 8.1 g/dL 7.1 7.2 7.0  Total Bilirubin 0.3 - 1.2 mg/dL 0.6 0.6 0.7  Alkaline Phos 38 - 126 U/L 122 133(H) 127(H)  AST 15 - 41 U/L 14(L) 19 14(L)  ALT 0 - 44 U/L '19 23 17    ' Lab Results  Component Value Date   IRON 78 07/31/2021   TIBC 420 07/31/2021   IRONPCTSAT 19 07/31/2021   (Iron and TIBC)  Lab Results  Component Value Date   FERRITIN 142 07/31/2021     SURGICAL PATHOLOGY   THIS IS AN ADDENDUM REPORT   CASE: [id number]   PATIENT: [facility]  Surgical Pathology Report  Addendum    Reason for Addendum #1:  DNA Mismatch Repair IHC Results   Clinical History: Fournier's Gangrene; Cancer ? ([facility])      FINAL MICROSCOPIC DIAGNOSIS:   A. IRRIGATION AND DEBRIDEMENT OF SCROTUM AND PENIS:  Acute necrotizing inflammation extensively involving fascia    B. Rectal wall biopsies:  Adenocarcinoma of the rectum        RADIOGRAPHIC STUDIES: I have personally reviewed the radiological images as listed and agreed with the findings in the report. No results found.   ASSESSMENT & PLAN:   James Coffey is a 51 y.o. male with   1.  Oligometastatic rectal cancer with 2 visible liver metastases-newly noted on CT chest abdomen pelvis. KRAS mutated MMR IHC normal  2.  Initially diagnosed locally Advanced Rectal Adenocarcinoma, c(T3c, N2), mismatch repair protein normal -presented to ED on 11/29/20 with septic shock secondary to Fournier's gangrene -CT AP showed findings suspicious for necrotizing perineal soft tissue infection with irregular presacral nodule. Rectal biopsies obtained on 11/30/20 confirmed rectal adenocarcinoma. -Baseline CEA on 12/09/20 was elevated at 16.7. -He underwent diagnostic laparoscopy and open descending loop colostomy on 12/13/20 with Dr. Harlow Asa. -Chest CT on 12/14/20 was negative for metastatic disease. -Repeat CEA on 01/06/21 showed further elevation to 52.55. -staging pelvis MRI on 01/12/21 showed: rectal adenocarcinoma T stage: At least T3c, possible involvement of prostatic apex; N stage: N2; long segment rectal tumor, involving anal sphincters. -Given locally advanced disease,he was recommended neoadjuvant Xeloda/Oxaliplatin (CAPOX)  3.  Iron deficiency anemia now resolved . Lab Results  Component Value Date   IRON 78 07/31/2021   TIBC 420 07/31/2021   IRONPCTSAT 19 07/31/2021   (Iron and TIBC)  Lab Results  Component Value Date   FERRITIN 142 07/31/2021   4. Rectal pain-  nearly resolved -tramadol prn  5insomnia -lorazepam prn  6. PERIANAL ABSCESS -draining spontaneously.  resolved with antibiotics.  PLAN -Patient has completed 4 cycles of Xeloda followed by 4 cycles of FOLFIRINOX and 1 cycle of FOLFIRI. -He is here for an additional cycle of FOLFIRI and is pending surgical consultation. -His labs look stable and his CEA level continues to drop and is down to 2.32. -  He has had his MRI of the abdomen and pelvis which shows good response to treatment -CT chest shows stable lung nodule -Patient has been referred to Dr. [name] at [facility] surgery for surgical consideration. -No significant new chemotherapy toxicities and he is appropriate to continue his scheduled FOLFIRI treatment today. -Further chemotherapy based on surgical plan. -Hopefully he will be candidate for resection of his hepatic metastases and his rectal primary and then radiation oncology can consider possible SBRT to his lung lesion.  Follow-up Follow-up and further management based on surgical input  Sullivan Lone MD MS Hematology/Oncology Physician [facility].   Addendum  Discussed case with Dr. [name] from [place] [facility].  We will hold further chemotherapy at this time.  She will be scheduling the patient for hepatic metastasectomy of one of the lesions and ablation by IR for second lesion. He will then need to follow-up with radiation oncology for consideration of SBRT to his lung lesion and rectal radiation. After which Dr. Sandrea Matte is intending to his rectal surgery.   [place] the total time spent in the appointment was 30 minutes*.  All of the patient's questions were answered with apparent satisfaction. The patient knows to call the clinic with any problems, questions or concerns.   Sullivan Lone MD MS AAHIVMS [facility] Hematology/Oncology Physician [facility]  .*Total Encounter Time as defined by the Centers  for Medicare and Medicaid Services includes, in addition to the face-to-face time of a patient visit (documented in the note above) non-face-to-face time: obtaining and reviewing outside history, ordering and reviewing medications, tests or procedures, care coordination (communications with other health care professionals or caregivers) and documentation in the medical record.

## 2021-08-07 ENCOUNTER — Other Ambulatory Visit: Payer: Self-pay

## 2021-08-07 ENCOUNTER — Inpatient Hospital Stay: Payer: Medicaid Other

## 2021-08-07 VITALS — BP 91/66 | HR 69 | Temp 98.6°F | Resp 16

## 2021-08-07 DIAGNOSIS — C20 Malignant neoplasm of rectum: Secondary | ICD-10-CM | POA: Diagnosis not present

## 2021-08-07 DIAGNOSIS — Z7189 Other specified counseling: Secondary | ICD-10-CM

## 2021-08-07 MED ORDER — FILGRASTIM-SNDZ 480 MCG/0.8ML IJ SOSY
480.0000 ug | PREFILLED_SYRINGE | Freq: Once | INTRAMUSCULAR | Status: AC
  Administered 2021-08-07: 480 ug via SUBCUTANEOUS
  Filled 2021-08-07: qty 0.8

## 2021-08-12 ENCOUNTER — Other Ambulatory Visit (HOSPITAL_COMMUNITY): Payer: Self-pay | Admitting: Surgery

## 2021-08-12 ENCOUNTER — Other Ambulatory Visit: Payer: Self-pay | Admitting: Surgery

## 2021-08-12 ENCOUNTER — Other Ambulatory Visit: Payer: Self-pay

## 2021-08-12 DIAGNOSIS — C20 Malignant neoplasm of rectum: Secondary | ICD-10-CM

## 2021-08-12 DIAGNOSIS — C787 Secondary malignant neoplasm of liver and intrahepatic bile duct: Secondary | ICD-10-CM

## 2021-08-14 ENCOUNTER — Telehealth: Payer: Self-pay | Admitting: Hematology

## 2021-08-14 ENCOUNTER — Inpatient Hospital Stay (HOSPITAL_BASED_OUTPATIENT_CLINIC_OR_DEPARTMENT_OTHER): Payer: Medicaid Other | Admitting: Hematology

## 2021-08-14 ENCOUNTER — Inpatient Hospital Stay: Payer: Medicaid Other

## 2021-08-14 ENCOUNTER — Other Ambulatory Visit: Payer: Self-pay

## 2021-08-14 ENCOUNTER — Ambulatory Visit: Payer: Medicaid Other

## 2021-08-14 VITALS — BP 105/75 | HR 71 | Temp 97.9°F | Resp 20 | Wt 177.4 lb

## 2021-08-14 DIAGNOSIS — C787 Secondary malignant neoplasm of liver and intrahepatic bile duct: Secondary | ICD-10-CM | POA: Diagnosis not present

## 2021-08-14 DIAGNOSIS — C20 Malignant neoplasm of rectum: Secondary | ICD-10-CM

## 2021-08-14 DIAGNOSIS — Z95828 Presence of other vascular implants and grafts: Secondary | ICD-10-CM

## 2021-08-14 LAB — CBC WITH DIFFERENTIAL (CANCER CENTER ONLY)
Abs Immature Granulocytes: 0.07 10*3/uL (ref 0.00–0.07)
Basophils Absolute: 0.1 10*3/uL (ref 0.0–0.1)
Basophils Relative: 1 %
Eosinophils Absolute: 0.3 10*3/uL (ref 0.0–0.5)
Eosinophils Relative: 4 %
HCT: 37.9 % — ABNORMAL LOW (ref 39.0–52.0)
Hemoglobin: 13 g/dL (ref 13.0–17.0)
Immature Granulocytes: 1 %
Lymphocytes Relative: 31 %
Lymphs Abs: 2.5 10*3/uL (ref 0.7–4.0)
MCH: 31.6 pg (ref 26.0–34.0)
MCHC: 34.3 g/dL (ref 30.0–36.0)
MCV: 92 fL (ref 80.0–100.0)
Monocytes Absolute: 0.8 10*3/uL (ref 0.1–1.0)
Monocytes Relative: 9 %
Neutro Abs: 4.4 10*3/uL (ref 1.7–7.7)
Neutrophils Relative %: 54 %
Platelet Count: 154 10*3/uL (ref 150–400)
RBC: 4.12 MIL/uL — ABNORMAL LOW (ref 4.22–5.81)
RDW: 16.9 % — ABNORMAL HIGH (ref 11.5–15.5)
WBC Count: 8.1 10*3/uL (ref 4.0–10.5)
nRBC: 0 % (ref 0.0–0.2)

## 2021-08-14 LAB — CMP (CANCER CENTER ONLY)
ALT: 21 U/L (ref 0–44)
AST: 14 U/L — ABNORMAL LOW (ref 15–41)
Albumin: 4.2 g/dL (ref 3.5–5.0)
Alkaline Phosphatase: 133 U/L — ABNORMAL HIGH (ref 38–126)
Anion gap: 6 (ref 5–15)
BUN: 11 mg/dL (ref 6–20)
CO2: 24 mmol/L (ref 22–32)
Calcium: 9.7 mg/dL (ref 8.9–10.3)
Chloride: 108 mmol/L (ref 98–111)
Creatinine: 0.76 mg/dL (ref 0.61–1.24)
GFR, Estimated: >60 mL/min (ref >60)
Glucose, Bld: 124 mg/dL — ABNORMAL HIGH (ref 70–99)
Potassium: 4 mmol/L (ref 3.5–5.1)
Sodium: 138 mmol/L (ref 135–145)
Total Bilirubin: 0.7 mg/dL (ref 0.3–1.2)
Total Protein: 7.1 g/dL (ref 6.5–8.1)

## 2021-08-14 MED ORDER — SODIUM CHLORIDE 0.9% FLUSH
10.0000 mL | Freq: Once | INTRAVENOUS | Status: AC
  Administered 2021-08-14: 09:00:00 10 mL

## 2021-08-14 MED ORDER — HEPARIN SOD (PORK) LOCK FLUSH 100 UNIT/ML IV SOLN
500.0000 [IU] | Freq: Once | INTRAVENOUS | Status: AC
  Administered 2021-08-14: 09:00:00 500 [IU]

## 2021-08-14 NOTE — Telephone Encounter (Signed)
Scheduled follow-up appointment per 3/9 los. Patient's daughter is aware. ?

## 2021-08-18 ENCOUNTER — Ambulatory Visit: Payer: Medicaid Other

## 2021-08-19 ENCOUNTER — Telehealth: Payer: Medicaid Other

## 2021-08-19 ENCOUNTER — Ambulatory Visit: Payer: Medicaid Other

## 2021-08-20 ENCOUNTER — Ambulatory Visit: Payer: Medicaid Other

## 2021-08-21 ENCOUNTER — Encounter: Payer: Self-pay | Admitting: Hematology

## 2021-08-21 ENCOUNTER — Ambulatory Visit: Payer: Medicaid Other

## 2021-08-21 NOTE — Progress Notes (Signed)
?[place]   ?Telephone:(336) [phone] Fax:([phone]   ?Clinic Follow up Note  ? ?Patient Care Team: ?[name], MD as PCP - General (Hematology) ?[name], MD as Consulting Physician (Urology) ?[name], MD as Consulting Physician (Gastroenterology) ?[name], MD as Consulting Physician (Colon and Rectal Surgery) ?[name], MD as Consulting Physician (Oncology) ?Earl Gala, Deliah Goody, RN as Sales executive (Oncology) ? ?Date of Service:  08/14/2021 ? ?CHIEF COMPLAINT:  ?Continued evaluation and management of oligometastatic rectal cancer ? 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  ?01/13/2021 Cancer Staging  ? Staging form: Colon and Rectum, AJCC 8th Edition ?- Clinical stage from 01/13/2021: Stage IIIB (cT3, cN2a, cM0) - Signed by [name], MD on 01/19/2021 ?Stage prefix: Initial diagnosis ?Histologic grade (G): G2 ?Histologic grading system: 4 grade system ? ?  ?01/20/2021 - 03/26/2021 Chemotherapy  ? Patient is on Treatment Plan : RECTAL Xelox (Capeox) q21d x 6 cycles  ?   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  ?05/22/2021 -  Chemotherapy  ? Patient is on Treatment Plan : PANCREAS Modified FOLFIRINOX q14d x 12 cycles  ?   ? ?Previous treatment ?-4 cycles of XELOX (August to October 2022) ?-4 cycles of FOLFIRINOX (December 2022 through February 2023) ?-FOLFIRI x 2 cycles --pending surgery. ? ? ?INTERVAL HISTORY:  ? ?Ravin Denardo is here for follow-up of his oligometastatic rectal cancer. ?He has been to Dr. [name] and has been scheduled for surgery for hepatic metastatic ectomy and IR ablation of his second liver met. ?He notes no acute new symptoms.  Eating well.  In good spirits.   Looking forward to having the remaining cancer removed with surgery. ?No other acute new infection issues at this time. ? ?MEDICAL HISTORY:  ?Past Medical History:  ?Diagnosis Date  ? Cancer [facility])   ? Tobacco abuse   ? ? ?SURGICAL HISTORY: ?Past Surgical History:  ?Procedure Laterality Date  ? HERNIA REPAIR    ? INCISION AND DRAINAGE ABSCESS N/A 12/01/2020  ? Procedure: INCISION AND DRAINAGE ABSCESS;  Surgeon: Michael Boston, MD;  Location: [facility];  Service: General;  Laterality: N/A;  ? INCISION AND DRAINAGE ABSCESS N/A 12/04/2020  ? Procedure: INCISION AND DRAINAGE FOURNIERS GANGRENE;  Surgeon: [name], MD;  [facility]: [facility];  Service: General;  Laterality: N/A;  ? INCISION AND DRAINAGE PERIRECTAL ABSCESS N/A 11/30/2020  ? Procedure: INCISION AND DEBRIDEMENT OF PERIRECTAL ABSCESS AND RECTAL WALL BIOPSIES;  Surgeon: Michael Boston, MD;  [facility]: [facility];  Service: General;  Laterality: N/A;  ? INGUINAL HERNIA REPAIR Right 2007  ? IR IMAGING GUIDED PORT INSERTION  05/07/2021  ? LAPAROSCOPIC LOOP COLOSTOMY N/A 12/13/2020  ? Procedure: DIAGNOSTIC LAPAROSCOPY WITH  OPEN DESCENDING LOOP COLOSTOMY;  Surgeon: Armandina Gemma, MD;  [facility]: [facility];  Service: General;  Laterality: N/A;  ? RECTAL SURGERY    ? ? ?I have reviewed the social history and family history with the patient and they are unchanged from previous note. ? ?ALLERGIES:  is allergic to hydrocodone. ? ?ROS ?10 Point review of Systems was done is negative except as noted above. ? ? ?MEDICATIONS:  ?Current Outpatient Medications  ?Medication Sig Dispense Refill  ? acetaminophen (TYLENOL) 500 MG tablet Take 2 tablets (1,000 mg total) by mouth every 8 (  eight) hours as needed for mild pain or fever.    ? bacitracin ointment Apply topically daily. Apply to lateral flank wounds 120 g 0  ? dexamethasone (DECADRON) 4 MG tablet Take 2 tablets (8 mg total) by mouth daily. Start the day after chemotherapy for 3 days. Take with food. 8 tablet 5  ? dronabinol  (MARINOL) 2.5 MG capsule Take 1 capsule by mouth 2 times daily before a meal. 60 capsule 0  ? ferrous sulfate 325 (65 FE) MG tablet Take 1 tablet (325 mg total) by mouth daily with breakfast. 30 tablet 0  ? ibuprofen (ADVIL) 600 MG tablet Take 1 tablet (600 mg total) by mouth 3 (three) times daily. 30 tablet 0  ? lidocaine-prilocaine (EMLA) cream Apply to affected area once 30 g 3  ? loperamide (IMODIUM) 2 MG capsule Take 2 tablets by mouth at onset of diarrhea, then 1 tab every 2hrs until 12hr without a BM. May take 2 tab every 4hrs at bedtime. If diarrhea recurs repeat. 30 capsule 1  ? LORazepam (ATIVAN) 0.5 MG tablet Take 1 tablet by mouth every 6 hours as needed for anxiety. 30 tablet 0  ? methocarbamol (ROBAXIN) 500 MG tablet Take 1 tablet (500 mg total) by mouth every 8 (eight) hours as needed for muscle spasms. 30 tablet 0  ? ondansetron (ZOFRAN) 8 MG tablet Take 1 tablet (8 mg total) by mouth 2 (two) times daily as needed. Start on day 3 after chemotherapy. 30 tablet 1  ? potassium chloride SA (KLOR-CON) 20 MEQ tablet Take 1 tablet (20 mEq total) by mouth daily for 14 days. 14 tablet 0  ? prochlorperazine (COMPAZINE) 10 MG tablet Take 1 tablet (10 mg total) by mouth every 6 (six) hours as needed for nausea or vomiting. 30 tablet 1  ? traMADol (ULTRAM) 50 MG tablet Take 1 tablet by mouth every 8 hours as needed. 30 tablet 0  ? ?No current facility-administered medications for this visit.  ? ?Facility-Administered Medications Ordered in Other Visits  ?Medication Dose Route Frequency Provider Last Rate Last Admin  ? palonosetron (ALOXI) 0.25 MG/5ML injection           ? sodium chloride flush (NS) 0.9 % injection 10 mL  10 mL Intracatheter Once [name], MD      ? ? ?PHYSICAL EXAMINATION: ?ECOG PERFORMANCE STATUS: 1 - Symptomatic but completely ambulatory ? ?Vital signs reviewed ?Wt Readings from Last 3 Encounters:  ?08/14/21 177 lb 6.4 oz (80.5 kg)  ?07/31/21 176 lb (79.8 kg)  ?07/17/21 177 lb 8 oz  (80.5 kg)  ?NAD ?GENERAL:alert, in no acute distress and comfortable ?SKIN: no acute rashes, no significant lesions ?EYES: conjunctiva are pink and non-injected, sclera anicteric ?OROPHARYNX: MMM, no exudates, no oropharyngeal erythema or ulceration ?NECK: supple, no JVD ?LYMPH:  no palpable lymphadenopathy in the cervical, axillary or inguinal regions ?LUNGS: clear to auscultation b/l with normal respiratory effort ?HEART: regular rate & rhythm ?ABDOMEN:  normoactive bowel sounds , non tender, not distended. ?Extremity: no pedal edema ?PSYCH: alert & oriented x 3 with fluent speech ?NEURO: no focal motor/sensory deficits ? ? ?LABORATORY DATA:  ?. ?CBC Latest Ref Rng & Units 08/14/2021 07/31/2021 07/17/2021  ?WBC 4.0 - 10.5 K/uL 8.1 8.7 5.6  ?Hemoglobin 13.0 - 17.0 g/dL 13.0 13.1 13.0  ?Hematocrit 39.0 - 52.0 % 37.9(L) 39.3 38.5(L)  ?Platelets 150 - 400 K/uL 154 159 138(L)  ? ?. ?CMP Latest Ref Rng & Units 08/14/2021 07/31/2021 07/17/2021  ?Glucose 70 -  99 mg/dL 124(H) 109(H) 132(H)  ?BUN 6 - 20 mg/dL 11 21(H) 11  ?Creatinine 0.61 - 1.24 mg/dL 0.76 0.83 0.79  ?Sodium 135 - 145 mmol/L 138 139 139  ?Potassium 3.5 - 5.1 mmol/L 4.0 4.0 3.8  ?Chloride 98 - 111 mmol/L 108 108 107  ?CO2 22 - 32 mmol/L '24 25 25  ' ?Calcium 8.9 - 10.3 mg/dL 9.7 9.7 9.4  ?Total Protein 6.5 - 8.1 g/dL 7.1 7.1 7.2  ?Total Bilirubin 0.3 - 1.2 mg/dL 0.7 0.6 0.6  ?Alkaline Phos 38 - 126 U/L 133(H) 122 133(H)  ?AST 15 - 41 U/L 14(L) 14(L) 19  ?ALT 0 - 44 U/L '21 19 23  ' ? ? ?Lab Results  ?Component Value Date  ? IRON 78 07/31/2021  ? TIBC 420 07/31/2021  ? IRONPCTSAT 19 07/31/2021  ? ?(Iron and TIBC) ? ?Lab Results  ?Component Value Date  ? FERRITIN 142 07/31/2021  ? ? ? ?SURGICAL PATHOLOGY  ? THIS IS AN ADDENDUM REPORT   ?CASE: [id number]  ?PATIENT: James Coffey  ?Surgical Pathology Report  ?Addendum   ? ?Reason for Addendum #1:  DNA Mismatch Repair IHC Results  ? ?Clinical History: Fournier's Gangrene; Cancer ? ([facility])  ? ? ? ? ?FINAL MICROSCOPIC DIAGNOSIS:   ? ?A. IRRIGATION AND DEBRIDEMENT OF SCROTUM AND PENIS:  ?Acute necrotizing inflammation extensively involving fascia  ? ? ?B. Rectal wall biopsies:  ?Adenocarcinoma of the rectum  ? ? ? ? ? ? ?RADIOGRAPHIC STUDIES:

## 2021-08-25 NOTE — Progress Notes (Signed)
Surgical Instructions ? ? ? Your procedure is scheduled on 08/28/21. ? Report to [facility] Main Entrance "A" at 5:30 A.M., then check in with the Admitting office. ? Call this number if you have problems the morning of surgery: ? [phone] ? ? If you have any questions prior to your surgery date call [phone]: Open Monday-Friday 8am-4pm ? ? ? Remember: ? Do not eat after midnight the night before your surgery ? ?You may drink clear liquids until 4:30 the morning of your surgery.   ?Clear liquids allowed are: Water, Non-Citrus Juices (without pulp), Carbonated Beverages, Clear Tea, Black Coffee ONLY (NO MILK, CREAM OR POWDERED CREAMER of any kind), and Gatorade ? ?Please complete your PRE-SURGERY ENSURE that was provided to you by 4:30 the morning of surgery.  Please, if able, drink it in one setting. DO NOT SIP.  ?  ? Take these medicines the morning of surgery with A SIP OF WATER:  ? ?IF NEEDED: ? ?acetaminophen (TYLENOL) ?LORazepam (ATIVAN) ?methocarbamol (ROBAXIN) ?ondansetron [facility]) ?prochlorperazine (COMPAZINE)  ?traMADol [facility])  ? ? ?As of today, STOP taking any Aspirin (unless otherwise instructed by your surgeon) Aleve, Naproxen, Ibuprofen, Motrin, Advil, Goody's, BC's, all herbal medications, fish oil, and all vitamins. ? ?         ?Do not wear jewelry ?Do not wear lotions, powders, colognes, or deodorant. ?Do not shave 48 hours prior to surgery.  Men may shave face and neck. ?Do not bring valuables to the hospital. ? ? ?[facility] [place] is not responsible for any belongings or valuables. .  ? ?Do NOT Smoke (Tobacco/Vaping)  24 hours prior to your procedure ? ?If you use a CPAP at night, you may bring your mask for your overnight stay. ?  ?Contacts, glasses, hearing aids, dentures or partials may not be worn into surgery, please bring cases for these belongings ?  ?For patients admitted to the hospital, discharge time will be determined by your treatment team. ?  ?Patients discharged the day of  surgery will not be allowed to drive home, and someone needs to stay with them for 24 hours. ? ?NO VISITORS WILL BE ALLOWED IN PRE-OP WHERE PATIENTS ARE PREPPED FOR SURGERY.  ONLY 1 SUPPORT PERSON MAY BE PRESENT IN THE WAITING ROOM WHILE YOU ARE IN SURGERY.  IF YOU ARE TO BE ADMITTED, ONCE YOU ARE IN YOUR ROOM YOU WILL BE ALLOWED TWO (2) VISITORS. 1 (ONE) VISITOR MAY STAY OVERNIGHT BUT MUST ARRIVE TO THE ROOM BY 8pm.  Minor children may have two parents present. Special consideration for safety and communication needs will be reviewed on a case by case basis. ? ?Special instructions:   ? ?Oral Hygiene is also important to reduce your risk of infection.  Remember - BRUSH YOUR TEETH THE MORNING OF SURGERY WITH YOUR REGULAR TOOTHPASTE ? ? ?[place]- Preparing For Surgery ? ?Before surgery, you can play an important role. Because skin is not sterile, your skin needs to be as free of germs as possible. You can reduce the number of germs on your skin by washing with CHG (chlorahexidine gluconate) Soap before surgery.  CHG is an antiseptic cleaner which kills germs and bonds with the skin to continue killing germs even after washing.   ? ? ?Please do not use if you have an allergy to CHG or antibacterial soaps. If your skin becomes reddened/irritated stop using the CHG.  ?Do not shave (including legs and underarms) for at least 48 hours prior to first CHG shower. It  is OK to shave your face. ? ?Please follow these instructions carefully. ?  ? ? Shower the NIGHT BEFORE SURGERY and the MORNING OF SURGERY with CHG Soap.  ? If you chose to wash your hair, wash your hair first as usual with your normal shampoo. After you shampoo, rinse your hair and body thoroughly to remove the shampoo.  Then [employer] and genitals (private parts) with your normal soap and rinse thoroughly to remove soap. ? ?After that Use CHG Soap as you would any other liquid soap. You can apply CHG directly to the skin and wash gently with a scrungie or  a clean washcloth.  ? ?Apply the CHG Soap to your body ONLY FROM THE NECK DOWN.  Do not use on open wounds or open sores. Avoid contact with your eyes, ears, mouth and genitals (private parts). Wash Face and genitals (private parts)  with your normal soap.  ? ?Wash thoroughly, paying special attention to the area where your surgery will be performed. ? ?Thoroughly rinse your body with warm water from the neck down. ? ?DO NOT shower/wash with your normal soap after using and rinsing off the CHG Soap. ? ?Pat yourself dry with a CLEAN TOWEL. ? ?Wear CLEAN PAJAMAS to bed the night before surgery ? ?Place CLEAN SHEETS on your bed the night before your surgery ? ?DO NOT SLEEP WITH PETS. ? ? ?Day of Surgery: ? ?Take a shower with CHG soap. ?Wear Clean/Comfortable clothing the morning of surgery ?Do not apply any deodorants/lotions.   ?Remember to brush your teeth WITH YOUR REGULAR TOOTHPASTE. ? ? ? ?  ?Please read over the following fact sheets that you were given.   ?

## 2021-08-26 ENCOUNTER — Other Ambulatory Visit: Payer: Self-pay

## 2021-08-26 ENCOUNTER — Encounter (HOSPITAL_COMMUNITY)
Admission: RE | Admit: 2021-08-26 | Discharge: 2021-08-26 | Disposition: A | Discharge status: Home / Self Care | Payer: Medicaid Other | Source: Ambulatory Visit | Attending: Surgery | Admitting: Surgery

## 2021-08-26 ENCOUNTER — Encounter (HOSPITAL_COMMUNITY): Payer: Self-pay

## 2021-08-26 DIAGNOSIS — C787 Secondary malignant neoplasm of liver and intrahepatic bile duct: Secondary | ICD-10-CM | POA: Diagnosis not present

## 2021-08-26 DIAGNOSIS — Z01812 Encounter for preprocedural laboratory examination: Secondary | ICD-10-CM | POA: Insufficient documentation

## 2021-08-26 DIAGNOSIS — C20 Malignant neoplasm of rectum: Secondary | ICD-10-CM | POA: Diagnosis not present

## 2021-08-26 NOTE — Progress Notes (Signed)
PCP: Sullivan Lone ?Cardiologist: denies ? ?EKG: na ?CXR: 11/30/20 ?ECHO: 12/06/20 ?Stress Test: denies ?Cardiac Cath: denies ? ?Fasting Blood Sugar- na ?Checks Blood Sugar__na_ times a day ? ?ASA/Blood Thinner: No ? ?OSA/CPAP: No ? ?Covid test not needed ? ?Anesthesia Review: No ? ?Patient denies shortness of breath, fever, cough, and chest pain at PAT appointment. ? ?Patient verbalized understanding of instructions provided today at the PAT appointment.  Patient asked to review instructions at home and day of surgery.   ?

## 2021-08-27 ENCOUNTER — Ambulatory Visit
Admission: RE | Admit: 2021-08-27 | Discharge: 2021-08-27 | Disposition: A | Discharge status: Home / Self Care | Payer: Medicaid Other | Source: Ambulatory Visit | Attending: Surgery | Admitting: Surgery

## 2021-08-27 ENCOUNTER — Encounter: Payer: Self-pay | Admitting: *Deleted

## 2021-08-27 HISTORY — PX: IR RADIOLOGIST EVAL & MGMT: IMG5224

## 2021-08-27 NOTE — Consult Note (Signed)
? ? ?Chief Complaint: ?Patient was seen in consultation today for possible ablation of liver metastasis at the request of James Coffey ? ?Referring Physician(s): ?James Coffey ? ?History of Present Illness: ?James Coffey is a 51 y.o. male diagnosed with rectal carcinoma in June of last year after presenting with perirectal abscess and Fournier's gangrene.  After initial surgery to treat infection, he underwent diverting descending colostomy due to obstruction from the rectal carcinoma by Dr. Harlow Coffey on 12/13/2020.  ? ?He underwent chemotherapy administered by Dr. Irene Coffey.  ? ?SUMMARY OF ONCOLOGIC HISTORY: ?   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   ?01/13/2021 Cancer Staging  ?  Staging form: Colon and Rectum, AJCC 8th Edition ?- Clinical stage from 01/13/2021: Stage IIIB (cT3, cN2a, cM0) - Signed by James Merle, MD on 01/19/2021 ?Stage prefix: Initial diagnosis ?Histologic grade (G): G2 ?Histologic grading system: 4 grade system ?  ?   ?01/20/2021 - 03/26/2021 Chemotherapy  ?  Patient is on Treatment Plan : RECTAL Xelox ([place]ox) q21d x 6 cycles  ?   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   ?05/22/2021 -  Chemotherapy  ?  Patient is on Treatment Plan : PANCREAS Modified FOLFIRINOX q14d x 12 cycles  ?   ?  ?Previous treatment ?-4 cycles of XELOX (August to October 2022) ?-4 cycles of FOLFIRINOX (December 2022 through February 2023) ?-FOLFIRI x 2 cycles --pending surgery. ? ?There was treatment response at the level of the rectal carcinoma between 2 rectal MRI studies dated 01/12/2021 and 07/13/2021.  Prior imaging in November, 2022 demonstrated 2 definite metastatic lesions in the liver with a central right lobe lesion in segment V measuring 1.7 cm by MRI and a left lobe lesion in segment  III measuring 1.8 cm by MRI.  Follow-up MRI on 07/13/2021 was impaired by respiratory motion but demonstrates some decrease in size of the right lobe lesion to approximately 1.4 cm with less discrete margination and stable to increased size of the left lobe lesion.  ? ?James Coffey is scheduled to undergo liver resection by Dr. Zenia Coffey tomorrow to remove the segment III metastatic lesion.  He has been referred to discuss possible percutaneous ablation of the residual right lobe lesion after recovering from surgical resection.  He currently is asymptomatic. ? ? ?Past Medical History:  ?Diagnosis Date  ? Cancer [facility])   ? Tobacco abuse   ? ? ?Past Surgical History:  ?Procedure Laterality Date  ? HERNIA REPAIR    ? INCISION AND DRAINAGE ABSCESS N/A 12/01/2020  ? Procedure: INCISION AND DRAINAGE ABSCESS;  Surgeon: Michael Boston, MD;  Location: [facility] ORS;  Service: General;  Laterality: N/A;  ? INCISION AND DRAINAGE ABSCESS N/A 12/04/2020  ? Procedure: INCISION AND DRAINAGE FOURNIERS GANGRENE;  Surgeon: [name], MD;  Location: [facility] ORS;  Service: General;  Laterality: N/A;  ? INCISION AND DRAINAGE PERIRECTAL ABSCESS N/A 11/30/2020  ? Procedure: INCISION AND DEBRIDEMENT OF PERIRECTAL ABSCESS AND RECTAL WALL BIOPSIES;  Surgeon: Michael Boston, MD;  Location: [facility] ORS;  Service: General;  Laterality: N/A;  ? INGUINAL HERNIA REPAIR Right 2007  ? IR IMAGING GUIDED PORT INSERTION  05/07/2021  ? LAPAROSCOPIC LOOP COLOSTOMY N/A 12/13/2020  ? Procedure: DIAGNOSTIC LAPAROSCOPY WITH  OPEN DESCENDING LOOP COLOSTOMY;  Surgeon: Armandina Gemma, MD;  Location: [facility]S;  Service: General;  Laterality: N/A;  ? RECTAL SURGERY    ? ? ?Allergies: ?Hydrocodone ? ?Medications: ?Prior to Admission medications   ?Medication Sig Start Date End Date Taking? Authorizing Provider  ?acetaminophen (TYLENOL) 500 MG tablet Take 2 tablets (1,000 mg total) by mouth every 8 (eight) hours as needed for mild pain or fever. 12/19/20   Norm Parcel, PA-C  ?bacitracin  ointment Apply topically daily. Apply to lateral flank wounds 12/19/20   Barb Merino, MD  ?dexamethasone (DECADRON) 4 MG tablet Take 2 tablets (8 mg total) by mouth daily. Start the day after chemotherapy for 3 days. Take with food. ?Patient not taking: Reported on 08/26/2021 05/07/21   [name], MD  ?dronabinol (MARINOL) 2.5 MG capsule Take 1 capsule by mouth 2 times daily before a meal. ?Patient not taking: Reported on 08/26/2021 07/03/21   [name], MD  ?ibuprofen (ADVIL) 200 MG tablet Take 400 mg by mouth every 6 (six) hours as needed.    [provider]  ?lidocaine-prilocaine (EMLA) cream Apply to affected area once 05/07/21   [name], MD  ?loperamide (IMODIUM) 2 MG capsule Take 2 tablets by mouth at onset of diarrhea, then 1 tab every 2hrs until 12hr without a BM. May take 2 tab every 4hrs at bedtime. If diarrhea recurs repeat. 05/07/21   [name], MD  ?LORazepam (ATIVAN) 0.5 MG tablet Take 1 tablet by mouth every 6 hours as needed for anxiety. 07/17/21   [name], MD  ?ondansetron (ZOFRAN) 8 MG tablet Take 1 tablet (8 mg total) by mouth 2 (two) times daily as needed. Start on day 3 after chemotherapy. ?Patient taking differently: Take 8 mg by mouth 2 (two) times daily as needed for nausea or vomiting. Start on day 3 after chemotherapy. 06/19/21   [name], MD  ?prochlorperazine (COMPAZINE) 10 MG tablet Take 1 tablet (10 mg total) by mouth every 6 (six) hours as needed for nausea or vomiting. 07/17/21   [name], MD  ?traMADol (ULTRAM) 50 MG tablet Take 1 tablet by mouth every 8 hours as needed. ?Patient taking differently: Take 50 mg by mouth every 8 (eight) hours as needed for moderate pain. 06/19/21   [name], MD  ?  ? ?Family History  ?Problem Relation Age of Onset  ? CAD Neg Hx   ? Inflammatory bowel disease Neg Hx   ? ? ?Social History  ? ?Socioeconomic History  ? Marital status: Married  ?  Spouse name:  Not on file  ? Number of children: Not on file  ? Years of education: Not on file  ? Highest education level: Not on file  ?Occupational History  ? Occupation: truck Geophysicist/field seismologist  ?Tobacco Use  ? Smoking status: Every Day  ?  Packs/day: 0.25  ?  Types: Cigarettes  ? Smokeless tobacco: Never  ?Vaping Use  ? Vaping Use: Never used  ?Substance and Sexual Activity  ? Alcohol use: No  ? Drug use: No  ? Sexual activity: Never  ?Other Topics Concern  ? Not on file  ?Social History Narrative  ? From [place].  Came to the [place] in 2000.  ? ?Social Determinants of Health  ? ?Financial Resource Strain: Not on file  ?Food Insecurity: Not on file  ?Transportation Needs: Not on file  ?Physical Activity: Not on file  ?Stress: Not on file  ?Social Connections: Not on file  ? ? ?ECOG Status: ?0 - Asymptomatic ? ?Review of  Systems: A 12 point ROS discussed and pertinent positives are indicated in the HPI above.  All other systems are negative. ? ?Review of Systems  ?Constitutional: Negative.   ?Respiratory: Negative.    ?Cardiovascular: Negative.   ?Gastrointestinal: Negative.   ?Genitourinary: Negative.   ?Musculoskeletal: Negative.   ?Neurological: Negative.   ? ?Vital Signs: ?BP 129/81 (BP Location: Left Arm)   Pulse 88   SpO2 97%  ? ?Physical Exam ?Vitals reviewed.  ?Constitutional:   ?   General: He is not in acute distress. ?   Appearance: Normal appearance. He is normal weight. He is not ill-appearing, toxic-appearing or diaphoretic.  ?Cardiovascular:  ?   Rate and Rhythm: Regular rhythm.  ?   Heart sounds: Normal heart sounds. No murmur heard. ?  No friction rub. No gallop.  ?Pulmonary:  ?   Effort: No respiratory distress.  ?   Breath sounds: Normal breath sounds. No stridor. No wheezing, rhonchi or rales.  ?Abdominal:  ?   General: Bowel sounds are normal. There is no distension.  ?   Palpations: Abdomen is soft.  ?   Tenderness: There is no abdominal tenderness. There is no guarding or rebound.  ?   Comments: Colostomy in LLQ.   ?Musculoskeletal:     ?   General: No swelling.  ?Skin: ?   General: Skin is warm and dry.  ?Neurological:  ?   General: No focal deficit present.  ?   Mental Status: He is alert and oriented to person, p

## 2021-08-27 NOTE — Anesthesia Preprocedure Evaluation (Addendum)
Anesthesia Evaluation  ?Patient identified by MRN, date of birth, ID band ?Patient awake ? ? ? ?Reviewed: ?Allergy & Precautions, NPO status , Patient's Chart, lab work & pertinent test results ? ?Airway ?Mallampati: I ? ?TM Distance: >3 FB ?Neck ROM: Full ? ? ? Dental ? ?(+) Edentulous Lower, Partial Upper ?All teeth except #8 missing upper:   ?Pulmonary ?Current Smoker and Patient abstained from smoking.,  ?  ?Pulmonary exam normal ?breath sounds clear to auscultation ? ? ? ? ? ? Cardiovascular ?negative cardio ROS ?Normal cardiovascular exam ?Rhythm:Regular Rate:Normal ? ?Echo 12/06/20 ?1. Left ventricular ejection fraction, by estimation, is 55 to 60%. The left ventricle has normal function. The left ventricle has no regional wall motion abnormalities. Left ventricular diastolic parameters were normal.  ??2. Right ventricular systolic function is normal. The right ventricular size is normal.  ??3. The mitral valve is grossly normal. Trivial mitral valve  ?regurgitation.  ??4. The aortic valve is grossly normal. Aortic valve regurgitation is not visualized.  ?  ?Neuro/Psych ?negative neurological ROS ? negative psych ROS  ? GI/Hepatic ?Metastatic liver disease ?Perirectal abscess ?Rectal carcinoma with metastasis to liver ?  ?Endo/Other  ?negative endocrine ROSHyperglycemia ? Renal/GU ?negative Renal ROS  ?negative genitourinary ?  ?Musculoskeletal ?negative musculoskeletal ROS ?(+)  ? Abdominal ?  ?Peds ? Hematology ?negative hematology ROS ?(+)   ?Anesthesia Other Findings ? ? Reproductive/Obstetrics ? ?  ? ? ? ? ? ? ? ? ? ? ? ? ? ?  ?  ? ? ? ? ? ? ?Anesthesia Physical ?Anesthesia Plan ? ?ASA: 2 ? ?Anesthesia Plan: General  ? ?Post-op Pain Management: Ketamine IV*, Precedex, Dilaudid IV and Tylenol PO (pre-op)*  ? ?Induction: Intravenous ? ?PONV Risk Score and Plan: 3 and Treatment may vary due to age or medical condition, Midazolam, Ondansetron and Dexamethasone ? ?Airway  Management Planned: Oral ETT ? ?Additional Equipment: Arterial line ? ?Intra-op Plan:  ? ?Post-operative Plan: Extubation in OR ? ?Informed Consent: I have reviewed the patients History and Physical, chart, labs and discussed the procedure including the risks, benefits and alternatives for the proposed anesthesia with the patient or authorized representative who has indicated his/her understanding and acceptance.  ? ? ? ?Dental advisory given ? ?Plan Discussed with: CRNA and Anesthesiologist ? ?Anesthesia Plan Comments:   ? ? ? ? ? ?Anesthesia Quick Evaluation ? ?

## 2021-08-28 ENCOUNTER — Other Ambulatory Visit: Payer: Self-pay

## 2021-08-28 ENCOUNTER — Inpatient Hospital Stay (HOSPITAL_COMMUNITY): Payer: Medicaid Other | Admitting: Certified Registered"

## 2021-08-28 ENCOUNTER — Inpatient Hospital Stay (HOSPITAL_COMMUNITY)
Admission: RE | Admit: 2021-08-28 | Discharge: 2021-08-31 | DRG: 982 | Disposition: A | Discharge status: Home / Self Care | Payer: Medicaid Other | Source: Ambulatory Visit | Attending: Surgery | Admitting: Surgery

## 2021-08-28 ENCOUNTER — Encounter (HOSPITAL_COMMUNITY): Payer: Self-pay | Admitting: Surgery

## 2021-08-28 ENCOUNTER — Encounter (HOSPITAL_COMMUNITY)
Admission: RE | Disposition: A | Discharge status: Home / Self Care | Payer: Self-pay | Source: Ambulatory Visit | Attending: Surgery

## 2021-08-28 DIAGNOSIS — Z885 Allergy status to narcotic agent status: Secondary | ICD-10-CM

## 2021-08-28 DIAGNOSIS — Z79899 Other long term (current) drug therapy: Secondary | ICD-10-CM | POA: Diagnosis not present

## 2021-08-28 DIAGNOSIS — C20 Malignant neoplasm of rectum: Secondary | ICD-10-CM

## 2021-08-28 DIAGNOSIS — Z933 Colostomy status: Secondary | ICD-10-CM

## 2021-08-28 DIAGNOSIS — F17200 Nicotine dependence, unspecified, uncomplicated: Secondary | ICD-10-CM | POA: Diagnosis present

## 2021-08-28 DIAGNOSIS — R911 Solitary pulmonary nodule: Secondary | ICD-10-CM | POA: Diagnosis present

## 2021-08-28 DIAGNOSIS — C787 Secondary malignant neoplasm of liver and intrahepatic bile duct: Secondary | ICD-10-CM | POA: Diagnosis present

## 2021-08-28 DIAGNOSIS — C7931 Secondary malignant neoplasm of brain: Secondary | ICD-10-CM | POA: Diagnosis present

## 2021-08-28 HISTORY — PX: OPEN PARTIAL HEPATECTOMY [83]: SHX5987

## 2021-08-28 HISTORY — PX: LAPAROSCOPY: SHX197

## 2021-08-28 LAB — CBC
HCT: 39.6 % (ref 39.0–52.0)
Hemoglobin: 12.8 g/dL — ABNORMAL LOW (ref 13.0–17.0)
MCH: 31.1 pg (ref 26.0–34.0)
MCHC: 32.3 g/dL (ref 30.0–36.0)
MCV: 96.1 fL (ref 80.0–100.0)
Platelets: 256 10*3/uL (ref 150–400)
RBC: 4.12 MIL/uL — ABNORMAL LOW (ref 4.22–5.81)
RDW: 14.8 % (ref 11.5–15.5)
WBC: 15.4 10*3/uL — ABNORMAL HIGH (ref 4.0–10.5)
nRBC: 0 % (ref 0.0–0.2)

## 2021-08-28 LAB — COMPREHENSIVE METABOLIC PANEL
ALT: 91 U/L — ABNORMAL HIGH (ref 0–44)
AST: 116 U/L — ABNORMAL HIGH (ref 15–41)
Albumin: 3.4 g/dL — ABNORMAL LOW (ref 3.5–5.0)
Alkaline Phosphatase: 115 U/L (ref 38–126)
Anion gap: 5 (ref 5–15)
BUN: 9 mg/dL (ref 6–20)
CO2: 23 mmol/L (ref 22–32)
Calcium: 8.7 mg/dL — ABNORMAL LOW (ref 8.9–10.3)
Chloride: 109 mmol/L (ref 98–111)
Creatinine, Ser: 0.83 mg/dL (ref 0.61–1.24)
GFR, Estimated: >60 mL/min (ref >60)
Glucose, Bld: 121 mg/dL — ABNORMAL HIGH (ref 70–99)
Potassium: 4.3 mmol/L (ref 3.5–5.1)
Sodium: 137 mmol/L (ref 135–145)
Total Bilirubin: 1 mg/dL (ref 0.3–1.2)
Total Protein: 6.6 g/dL (ref 6.5–8.1)

## 2021-08-28 LAB — PREPARE RBC (CROSSMATCH)

## 2021-08-28 SURGERY — HEPATECTOMY, PARTIAL, OPEN
Anesthesia: General | Site: Abdomen

## 2021-08-28 MED ORDER — DROPERIDOL 2.5 MG/ML IJ SOLN: 0.6250 mg | Freq: Once | INTRAMUSCULAR | Status: DC | PRN

## 2021-08-28 MED ORDER — PROPOFOL 10 MG/ML IV BOLUS
INTRAVENOUS | Status: AC
  Filled 2021-08-28: qty 20

## 2021-08-28 MED ORDER — KETAMINE HCL 10 MG/ML IJ SOLN
INTRAMUSCULAR | Status: DC | PRN
  Administered 2021-08-28 (×2): 20 mg via INTRAVENOUS

## 2021-08-28 MED ORDER — DOCUSATE SODIUM 100 MG PO CAPS
100.0000 mg | ORAL_CAPSULE | Freq: Two times a day (BID) | ORAL | Status: DC
  Administered 2021-08-28 – 2021-08-31 (×7): 100 mg via ORAL
  Filled 2021-08-28 (×9): qty 1

## 2021-08-28 MED ORDER — HYDROMORPHONE HCL 1 MG/ML IJ SOLN
0.5000 mg | INTRAMUSCULAR | Status: DC | PRN
  Administered 2021-08-28: 0.5 mg via INTRAVENOUS
  Filled 2021-08-28: qty 0.5

## 2021-08-28 MED ORDER — MIDAZOLAM HCL 2 MG/2ML IJ SOLN
INTRAMUSCULAR | Status: AC
  Filled 2021-08-28: qty 2

## 2021-08-28 MED ORDER — BUPIVACAINE HCL (PF) 0.25 % IJ SOLN
INTRAMUSCULAR | Status: AC
  Filled 2021-08-28: qty 30

## 2021-08-28 MED ORDER — CHLORHEXIDINE GLUCONATE 0.12 % MT SOLN
OROMUCOSAL | Status: AC
  Administered 2021-08-28: 15 mL via OROMUCOSAL
  Filled 2021-08-28: qty 15

## 2021-08-28 MED ORDER — BUPIVACAINE HCL 0.25 % IJ SOLN
INTRAMUSCULAR | Status: DC | PRN
  Administered 2021-08-28: 20 mL

## 2021-08-28 MED ORDER — SODIUM CHLORIDE 0.9 % IR SOLN
Status: DC | PRN
  Administered 2021-08-28: 1000 mL

## 2021-08-28 MED ORDER — ENSURE PRE-SURGERY PO LIQD: 592.0000 mL | Freq: Once | ORAL | Status: DC

## 2021-08-28 MED ORDER — ACETAMINOPHEN 500 MG PO TABS
1000.0000 mg | ORAL_TABLET | Freq: Once | ORAL | Status: AC
  Administered 2021-08-28: 1000 mg via ORAL
  Filled 2021-08-28: qty 2

## 2021-08-28 MED ORDER — ONDANSETRON HCL 4 MG/2ML IJ SOLN: 4.0000 mg | Freq: Once | INTRAMUSCULAR | Status: DC | PRN

## 2021-08-28 MED ORDER — SUGAMMADEX SODIUM 200 MG/2ML IV SOLN
INTRAVENOUS | Status: DC | PRN
  Administered 2021-08-28: 200 mg via INTRAVENOUS

## 2021-08-28 MED ORDER — PROPOFOL 10 MG/ML IV BOLUS
INTRAVENOUS | Status: DC | PRN
Start: 2021-08-28 — End: 2021-08-28
  Administered 2021-08-28: 200 mg via INTRAVENOUS

## 2021-08-28 MED ORDER — FENTANYL CITRATE (PF) 250 MCG/5ML IJ SOLN
INTRAMUSCULAR | Status: AC
  Filled 2021-08-28: qty 5

## 2021-08-28 MED ORDER — DEXAMETHASONE SODIUM PHOSPHATE 10 MG/ML IJ SOLN
INTRAMUSCULAR | Status: DC | PRN
  Administered 2021-08-28: 5 mg via INTRAVENOUS

## 2021-08-28 MED ORDER — ONDANSETRON 4 MG PO TBDP: 4.0000 mg | ORAL_TABLET | Freq: Four times a day (QID) | ORAL | Status: DC | PRN

## 2021-08-28 MED ORDER — ENSURE PRE-SURGERY PO LIQD: 296.0000 mL | Freq: Once | ORAL | Status: DC

## 2021-08-28 MED ORDER — ENOXAPARIN SODIUM 40 MG/0.4ML IJ SOSY
40.0000 mg | PREFILLED_SYRINGE | INTRAMUSCULAR | Status: DC
  Administered 2021-08-29 – 2021-08-31 (×3): 40 mg via SUBCUTANEOUS
  Filled 2021-08-28 (×3): qty 0.4

## 2021-08-28 MED ORDER — METHOCARBAMOL 500 MG PO TABS
500.0000 mg | ORAL_TABLET | Freq: Four times a day (QID) | ORAL | Status: DC
  Administered 2021-08-28 – 2021-08-31 (×11): 500 mg via ORAL
  Filled 2021-08-28 (×14): qty 1

## 2021-08-28 MED ORDER — LIDOCAINE 2% (20 MG/ML) 5 ML SYRINGE
INTRAMUSCULAR | Status: DC | PRN
  Administered 2021-08-28: 50 mg via INTRAVENOUS

## 2021-08-28 MED ORDER — HYDROMORPHONE HCL 1 MG/ML IJ SOLN
INTRAMUSCULAR | Status: AC
  Filled 2021-08-28: qty 2

## 2021-08-28 MED ORDER — OXYCODONE HCL 5 MG PO TABS
5.0000 mg | ORAL_TABLET | ORAL | Status: DC | PRN
  Administered 2021-08-28 (×2): 10 mg via ORAL
  Administered 2021-08-30: 5 mg via ORAL
  Administered 2021-08-30: 10 mg via ORAL
  Filled 2021-08-28: qty 1
  Filled 2021-08-28 (×3): qty 2

## 2021-08-28 MED ORDER — LACTATED RINGERS IV SOLN: INTRAVENOUS | Status: DC

## 2021-08-28 MED ORDER — CEFAZOLIN SODIUM-DEXTROSE 2-4 GM/100ML-% IV SOLN
2.0000 g | INTRAVENOUS | Status: AC
  Administered 2021-08-28: 2 g via INTRAVENOUS
  Filled 2021-08-28: qty 100

## 2021-08-28 MED ORDER — MIDAZOLAM HCL 2 MG/2ML IJ SOLN
INTRAMUSCULAR | Status: DC | PRN
  Administered 2021-08-28: 2 mg via INTRAVENOUS

## 2021-08-28 MED ORDER — PHENYLEPHRINE 40 MCG/ML (10ML) SYRINGE FOR IV PUSH (FOR BLOOD PRESSURE SUPPORT)
PREFILLED_SYRINGE | INTRAVENOUS | Status: DC | PRN
  Administered 2021-08-28 (×2): 80 ug via INTRAVENOUS

## 2021-08-28 MED ORDER — HEMOSTATIC AGENTS (NO CHARGE) OPTIME
TOPICAL | Status: DC | PRN
  Administered 2021-08-28: 1 via TOPICAL

## 2021-08-28 MED ORDER — FENTANYL CITRATE (PF) 250 MCG/5ML IJ SOLN
INTRAMUSCULAR | Status: DC | PRN
  Administered 2021-08-28 (×4): 50 ug via INTRAVENOUS
  Administered 2021-08-28: 100 ug via INTRAVENOUS

## 2021-08-28 MED ORDER — CHLORHEXIDINE GLUCONATE 0.12 % MT SOLN: 15.0000 mL | Freq: Once | OROMUCOSAL | Status: AC

## 2021-08-28 MED ORDER — LACTATED RINGERS IV SOLN: INTRAVENOUS | Status: DC | PRN

## 2021-08-28 MED ORDER — PHENYLEPHRINE HCL-NACL 20-0.9 MG/250ML-% IV SOLN
INTRAVENOUS | Status: DC | PRN
  Administered 2021-08-28: 40 ug/min via INTRAVENOUS

## 2021-08-28 MED ORDER — HYDROMORPHONE HCL 1 MG/ML IJ SOLN
INTRAMUSCULAR | Status: AC
  Filled 2021-08-28: qty 0.5

## 2021-08-28 MED ORDER — ONDANSETRON HCL 4 MG/2ML IJ SOLN
INTRAMUSCULAR | Status: DC | PRN
  Administered 2021-08-28: 4 mg via INTRAVENOUS

## 2021-08-28 MED ORDER — ONDANSETRON HCL 4 MG/2ML IJ SOLN: 4.0000 mg | Freq: Four times a day (QID) | INTRAMUSCULAR | Status: DC | PRN

## 2021-08-28 MED ORDER — 0.9 % SODIUM CHLORIDE (POUR BTL) OPTIME
TOPICAL | Status: DC | PRN
  Administered 2021-08-28 (×2): 1000 mL

## 2021-08-28 MED ORDER — HYDROMORPHONE HCL 1 MG/ML IJ SOLN
0.2500 mg | INTRAMUSCULAR | Status: DC | PRN
  Administered 2021-08-28 (×4): 0.5 mg via INTRAVENOUS

## 2021-08-28 MED ORDER — HYDROMORPHONE HCL 1 MG/ML IJ SOLN
INTRAMUSCULAR | Status: DC | PRN
  Administered 2021-08-28 (×2): .5 mg via INTRAVENOUS

## 2021-08-28 MED ORDER — ROCURONIUM BROMIDE 10 MG/ML (PF) SYRINGE
PREFILLED_SYRINGE | INTRAVENOUS | Status: DC | PRN
  Administered 2021-08-28: 40 mg via INTRAVENOUS
  Administered 2021-08-28: 60 mg via INTRAVENOUS

## 2021-08-28 MED ORDER — DIPHENHYDRAMINE HCL 25 MG PO CAPS: 25.0000 mg | ORAL_CAPSULE | Freq: Four times a day (QID) | ORAL | Status: DC | PRN

## 2021-08-28 MED ORDER — KETAMINE HCL 50 MG/5ML IJ SOSY
PREFILLED_SYRINGE | INTRAMUSCULAR | Status: AC
  Filled 2021-08-28: qty 5

## 2021-08-28 MED ORDER — KETOROLAC TROMETHAMINE 15 MG/ML IJ SOLN
15.0000 mg | Freq: Three times a day (TID) | INTRAMUSCULAR | Status: AC
  Administered 2021-08-28 – 2021-08-30 (×6): 15 mg via INTRAVENOUS
  Filled 2021-08-28 (×8): qty 1

## 2021-08-28 MED ORDER — DIPHENHYDRAMINE HCL 50 MG/ML IJ SOLN: 25.0000 mg | Freq: Four times a day (QID) | INTRAMUSCULAR | Status: DC | PRN

## 2021-08-28 MED ORDER — ORAL CARE MOUTH RINSE: 15.0000 mL | Freq: Once | OROMUCOSAL | Status: AC

## 2021-08-28 MED ORDER — PHENYLEPHRINE HCL-NACL 20-0.9 MG/250ML-% IV SOLN
INTRAVENOUS | Status: AC
  Filled 2021-08-28: qty 250

## 2021-08-28 SURGICAL SUPPLY — 84 items
BAG COUNTER SPONGE SURGICOUNT (BAG) ×3 IMPLANT
BIOPATCH RED 1 DISK 7.0 (GAUZE/BANDAGES/DRESSINGS) ×6 IMPLANT
BLADE CLIPPER SURG (BLADE) IMPLANT
BOOT SUTURE AID YELLOW STND (SUTURE) IMPLANT
CANISTER SUCT 3000ML PPV (MISCELLANEOUS) ×3 IMPLANT
CHLORAPREP W/TINT 26 (MISCELLANEOUS) ×3 IMPLANT
CLIP TI WIDE RED SMALL 24 (CLIP) ×3 IMPLANT
CLIP VESOCCLUDE MED 24/CT (CLIP) ×3 IMPLANT
CNTNR URN SCR LID CUP LEK RST (MISCELLANEOUS) IMPLANT
CONT SPEC 4OZ STRL OR WHT (MISCELLANEOUS)
COVER SURGICAL LIGHT HANDLE (MISCELLANEOUS) ×3 IMPLANT
DERMABOND ADVANCED (GAUZE/BANDAGES/DRESSINGS) ×1
DERMABOND ADVANCED .7 DNX12 (GAUZE/BANDAGES/DRESSINGS) ×4 IMPLANT
DRAIN CHANNEL 19F RND (DRAIN) ×2 IMPLANT
DRAPE INCISE IOBAN 66X45 STRL (DRAPES) ×3 IMPLANT
DRAPE LAPAROSCOPIC ABDOMINAL (DRAPES) ×3 IMPLANT
DRAPE WARM FLUID 44X44 (DRAPES) ×3 IMPLANT
DRSG TEGADERM 4X4.75 (GAUZE/BANDAGES/DRESSINGS) ×6 IMPLANT
DRSG TELFA 3X8 NADH (GAUZE/BANDAGES/DRESSINGS) IMPLANT
ELECT BLADE 6.5 EXT (BLADE) ×3 IMPLANT
ELECT CAUTERY BLADE 6.4 (BLADE) ×3 IMPLANT
ELECT PAD DSPR THERM+ ADLT (MISCELLANEOUS) ×3 IMPLANT
ELECT REM PT RETURN 9FT ADLT (ELECTROSURGICAL) ×3
ELECTRODE REM PT RTRN 9FT ADLT (ELECTROSURGICAL) ×2 IMPLANT
EVACUATOR SILICONE 100CC (DRAIN) ×2 IMPLANT
GAUZE 4X4 16PLY ~~LOC~~+RFID DBL (SPONGE) ×1 IMPLANT
GLOVE SURG POLY MICRO LF SZ5.5 (GLOVE) ×6 IMPLANT
GLOVE SURG UNDER POLY LF SZ6 (GLOVE) ×3 IMPLANT
GOWN STRL REUS W/ TWL LRG LVL3 (GOWN DISPOSABLE) ×4 IMPLANT
GOWN STRL REUS W/TWL LRG LVL3 (GOWN DISPOSABLE) ×6
HAND PENCIL TRP OPTION (MISCELLANEOUS) ×3 IMPLANT
HANDLE SUCTION POOLE (INSTRUMENTS) ×2 IMPLANT
HEMOSTAT HEMOBLAST BELLOWS (HEMOSTASIS) ×1 IMPLANT
HEMOSTAT SNOW SURGICEL 2X4 (HEMOSTASIS) ×3 IMPLANT
KIT BASIN OR (CUSTOM PROCEDURE TRAY) ×3 IMPLANT
KIT OSTOMY DRAINABLE 2.75 STR (WOUND CARE) ×1 IMPLANT
KIT TURNOVER KIT B (KITS) ×3 IMPLANT
LIGASURE IMPACT 36 18CM CVD LR (INSTRUMENTS) IMPLANT
NDL INSUFFLATION 14GA 120MM (NEEDLE) ×2 IMPLANT
NEEDLE INSUFFLATION 14GA 120MM (NEEDLE) ×3 IMPLANT
NS IRRIG 1000ML POUR BTL (IV SOLUTION) ×6 IMPLANT
PACK GENERAL/GYN (CUSTOM PROCEDURE TRAY) ×2 IMPLANT
PAD ARMBOARD 7.5X6 YLW CONV (MISCELLANEOUS) ×6 IMPLANT
PAD DRESSING TELFA 3X8 NADH (GAUZE/BANDAGES/DRESSINGS) IMPLANT
PENCIL SMOKE EVACUATOR (MISCELLANEOUS) ×3 IMPLANT
RELOAD STAPLE 60 2.6 WHT THN (STAPLE) IMPLANT
RELOAD STAPLER WHITE 60MM (STAPLE) ×4 IMPLANT
RETRACTOR WOUND ALXS 34CM XLRG (MISCELLANEOUS) IMPLANT
RTRCTR WOUND ALEXIS 34CM XLRG (MISCELLANEOUS)
SCISSORS LAP 5X35 DISP (ENDOMECHANICALS) IMPLANT
SEALER BIPOLAR AQUA 6.0 (INSTRUMENTS) ×3 IMPLANT
SET IRRIG TUBING LAPAROSCOPIC (IRRIGATION / IRRIGATOR) IMPLANT
SET TUBE SMOKE EVAC HIGH FLOW (TUBING) ×3 IMPLANT
SLEEVE ENDOPATH XCEL 5M (ENDOMECHANICALS) ×3 IMPLANT
SPONGE T-LAP 18X18 ~~LOC~~+RFID (SPONGE) ×3 IMPLANT
STAPLE ECHEON FLEX 60 POW ENDO (STAPLE) ×1 IMPLANT
STAPLER RELOAD WHITE 60MM (STAPLE) ×6
SUCTION POOLE HANDLE (INSTRUMENTS) ×3
SUT CHROMIC 0 BP (SUTURE) ×6 IMPLANT
SUT CHROMIC 3 0 CT 36 (SUTURE) IMPLANT
SUT ETHILON 2 0 FS 18 (SUTURE) ×3 IMPLANT
SUT MNCRL AB 4-0 PS2 18 (SUTURE) ×3 IMPLANT
SUT PDS AB 1 TP1 96 (SUTURE) ×6 IMPLANT
SUT PDS AB 4-0 RB1 27 (SUTURE) IMPLANT
SUT PROLENE 2 0 SH DA (SUTURE) IMPLANT
SUT PROLENE 3 0 SH 48 (SUTURE) ×3 IMPLANT
SUT PROLENE 4 0 RB 1 (SUTURE) ×3
SUT PROLENE 4-0 RB1 .5 CRCL 36 (SUTURE) ×4 IMPLANT
SUT SILK 2 0 SH (SUTURE) IMPLANT
SUT SILK 2 0 TIES 10X30 (SUTURE) ×3 IMPLANT
SUT SILK 2 0SH CR/8 30 (SUTURE) IMPLANT
SUT SILK 3 0 TIES 10X30 (SUTURE) ×1 IMPLANT
SUT SILK 3 0SH CR/8 30 (SUTURE) ×1 IMPLANT
SUT VIC AB 3-0 SH 27 (SUTURE) ×3
SUT VIC AB 3-0 SH 27X BRD (SUTURE) ×2 IMPLANT
SYR BULB IRRIG 60ML STRL (SYRINGE) ×3 IMPLANT
TOWEL GREEN STERILE (TOWEL DISPOSABLE) ×3 IMPLANT
TOWEL GREEN STERILE FF (TOWEL DISPOSABLE) ×3 IMPLANT
TRAY FOLEY MTR SLVR 14FR STAT (SET/KITS/TRAYS/PACK) ×3 IMPLANT
TRAY LAPAROSCOPIC MC (CUSTOM PROCEDURE TRAY) ×3 IMPLANT
TROCAR XCEL BLUNT TIP 100MML (ENDOMECHANICALS) IMPLANT
TROCAR XCEL NON-BLD 5MMX100MML (ENDOMECHANICALS) ×3 IMPLANT
TUBE CONNECTING 12X1/4 (SUCTIONS) IMPLANT
WARMER LAPAROSCOPE (MISCELLANEOUS) ×3 IMPLANT

## 2021-08-28 NOTE — Anesthesia Procedure Notes (Signed)
Procedure Name: Intubation ?Date/Time: 08/28/2021 7:41 AM ?Performed by: Amadeo Garnet, CRNA ?Pre-anesthesia Checklist: Patient identified, Emergency Drugs available, Suction available and Patient being monitored ?Patient Re-evaluated:Patient Re-evaluated prior to induction ?Oxygen Delivery Method: Circle system utilized ?Preoxygenation: Pre-oxygenation with 100% oxygen ?Induction Type: IV induction ?Ventilation: Mask ventilation without difficulty and Oral airway inserted - appropriate to patient size ?Laryngoscope Size: Mac and 4 ?Grade View: Grade I ?Tube type: Oral ?Tube size: 7.5 mm ?Number of attempts: 1 ?Airway Equipment and Method: Stylet and Oral airway ?Placement Confirmation: ETT inserted through vocal cords under direct vision, positive ETCO2 and breath sounds checked- equal and bilateral ?Secured at: 22 cm ?Tube secured with: Tape ?Dental Injury: Teeth and Oropharynx as per pre-operative assessment  ?Comments: DL x2, first attempt made by paramedic student, unsuccessful. Second DL by CRNA, with grade 1 view, ETT placed atraumatically, VSS ? ? ? ? ?

## 2021-08-28 NOTE — Interval H&P Note (Signed)
History and Physical Interval Note: ? ?08/28/2021 ?7:16 AM ? ?James Coffey  has presented today for surgery, with the diagnosis of METASTATIC RECTAL CANCER.  The various methods of treatment have been discussed with the patient and family. After consideration of risks, benefits and other options for treatment, the patient has consented to  Procedure(s): ?OPEN LEFT LATERAL HEPATECTOMY WITH INTRAOPERATIVE ULTRASOUND (Left) ?LAPAROSCOPY DIAGNOSTIC (N/A) as a surgical intervention.  The patient's history has been reviewed, patient examined, no change in status, stable for surgery.  I have reviewed the patient's chart and labs.  Questions were answered to the patient's satisfaction.  Proceed to OR, admit to inpatient postoperatively. Patient seen in IR clinic yesterday, will plan for further ultrasound evaluation of the right liver lesion for ablation in a few weeks. ? ? ?Dwan Bolt ? ? ?

## 2021-08-28 NOTE — Op Note (Addendum)
Date: 08/28/21 ? ?Patient: James Coffey ?MRN: [id number] ? ?Preoperative Diagnosis: Stage IV rectal cancer with liver metastases ?Postoperative Diagnosis: Same ? ?Procedure:  ?Staging laparoscopy ?Open partial left hepatectomy (segment 3 monosegmentectomy) ?Intraoperative ultrasound ? ?Surgeon: Michaelle Birks, MD ?Assistant: Christie Beckers, MD ? ?EBL: 100 mL ? ?Anesthesia: General endotracheal ? ?Specimens: Left liver mass ? ?Indications: James Coffey is a 51 yo male who presented last June with Fournier's gangrene and was found to have a rectal mass during operative debridement. Staging workup showed no metastatic disease. He underwent placement of a descending colostomy and later began systemic treatment with oxaliplatin/Xeloda. After several months of chemo, restaging scans showed new masses in the liver, consistent with metastatic disease. He then completed 4 cycles FOLFIRINOX, and restaging scans showed downsize of the primary tumor, as well as decrease in size of the right liver mass. He also has a 3cm mass in the left lateral liver. After a multidisciplinary discussion, the decision was made to proceed with resection of the left liver mass, and the right liver mass will be percutaneously ablated by IR. ? ?Findings: Superficial mass on the inferior surface of segment 3 of the liver. Hypoechoic mass deep in the right lobe of the liver near the middle hepatic vein in segment 5. No other evidence of metastatic disease within the abdomen. ? ?Procedure details: Informed consent was obtained in the preoperative area prior to the procedure. The patient was brought to the operating room and placed on the table in the supine position. General anesthesia was induced and appropriate lines and drains were placed for intraoperative monitoring. Perioperative antibiotics were administered per SCIP guidelines. The abdomen was prepped and draped in the usual sterile fashion. A pre-procedure timeout was taken verifying patient identity,  surgical site and procedure to be performed. ? ?A small skin incision was made in the upper midline, the fascia was grasped and elevated, and a Veress needle was inserted.  Intraperitoneal placement was confirmed with the saline drop test, and the abdomen was insufflated.  A 5 mm Visiport was placed and the abdomen was visually inspected.  There were no nodules on the surface of the liver, the peritoneal surface, or the small bowel.  The port was removed and the abdomen was desufflated.   ? ?An upper midline skin incision was made and the subcutaneous tissue was divided with cautery.  The fascia was opened along the linea alba and the peritoneal cavity was entered.  There were minimal adhesions to the omentum at the lower aspect of the incision, and these were taken down with cautery.  The falciform ligament was ligated with 2-0 silk ties and divided, and taken down off the abdominal wall.  A Bookwalter fixed retractor was placed.  The liver was examined, and a superficial malignant-appearing mass was identified on the inferior surface of the left lateral liver, corresponding with the preoperative imaging.  An intraoperative ultrasound was performed of the liver.  The mass was visualized in segment 3.  The remainder of the liver was examined, and an additional hypoechoic mass was visualized in the right liver, adjacent to the middle hepatic vein in segment 5.  This also corresponded to the preoperative MRI findings.  No other lesions were identified in the liver. ? ?The planned margin of resection was marked in the left liver around segment 3, using ultrasound to demarcate the area and ensure that the segment 2 and main left portal pedicles were preserved.  The liver parenchyma was then divided using the Aquamantys  and a crush clamp technique.  Small visible vessels were clipped prior to division.  The segment 3 portal pedicle was encountered and divided with a stapler using white loads.  The remainder of the  parenchyma was divided with the Aquamantys, and the specimen was excised.  The specimen was examined on the back table and the margins were grossly free of tumor.  The specimen was sent for routine pathology.  Hemostasis was then achieved on the cut surface of the liver using the Aquamantys and argon plasma coagulation.  An ultrasound was performed postresection and confirmed that the segment 2 portal pedicle was intact, and segment 2 visually appeared well perfused.  A Valsalva maneuver was performed and a clean lap pad was pressed on the cut surface of the liver and there was no evidence of bile leak.  Hemoblast was applied to maintain hemostasis.  The retractors were removed.  The fascia was closed with a running looped 1 PDS suture.  Scarpa's layer was closed with a running 3-0 Vicryl suture, and the skin was closed with a running subcuticular 4-0 Monocryl suture.  Dermabond was applied. ? ?The patient tolerated the procedure well with no apparent complications.  All counts were correct x2 at the end of the procedure. The patient was extubated and taken to PACU in stable condition. ? ?Michaelle Birks, MD ?08/28/21 ?9:48 AM ? ? ?Intraoperative ultrasound: ? ?Segment 3 mass: ? ? ?Segment 5 mass: ? ? ?Post-resection left lateral liver: ? ? ?

## 2021-08-28 NOTE — Transfer of Care (Signed)
Immediate Anesthesia Transfer of Care Note ? ?Patient: James Coffey ? ?Procedure(s) Performed: OPEN LEFT LATERAL HEPATECTOMY WITH INTRAOPERATIVE ULTRASOUND (Left: Abdomen) ?LAPAROSCOPY DIAGNOSTIC (Abdomen) ? ?Patient Location: PACU ? ?Anesthesia Type:General ? ?Level of Consciousness: awake, alert  and oriented ? ?Airway & Oxygen Therapy: Patient Spontanous Breathing ? ?Post-op Assessment: Report given to RN, Post -op Vital signs reviewed and stable and Patient moving all extremities ? ?Post vital signs: Reviewed and stable ? ?Last Vitals:  ?Vitals Value Taken Time  ?BP 152/92 08/28/21 0941  ?Temp    ?Pulse 88 08/28/21 0944  ?Resp 21 08/28/21 0944  ?SpO2 96 % 08/28/21 0944  ?Vitals shown include unvalidated device data. ? ?Last Pain:  ?Vitals:  ? 08/28/21 0612  ?TempSrc: Oral  ?PainSc:   ?   ? ?  ? ?Complications: No notable events documented. ?

## 2021-08-28 NOTE — Anesthesia Procedure Notes (Signed)
Arterial Line Insertion ?Start/End3/23/2023 7:00 AM, 08/28/2021 7:10 AM ?Performed by: Amadeo Garnet, CRNA, CRNA ? Patient location: Pre-op. ?Preanesthetic checklist: patient identified, IV checked, site marked, risks and benefits discussed, surgical consent, monitors and equipment checked, pre-op evaluation, timeout performed and anesthesia consent ?Lidocaine 1% used for infiltration ?Left, radial was placed ?Catheter size: 20 G ?Hand hygiene performed  and maximum sterile barriers used  ? ?Attempts: 1 ?Procedure performed without using ultrasound guided technique. ?Following insertion, dressing applied and Biopatch. ?Post procedure assessment: normal ? ?Patient tolerated the procedure well with no immediate complications. ? ? ?

## 2021-08-28 NOTE — Anesthesia Postprocedure Evaluation (Signed)
Anesthesia Post Note ? ?Patient: Jaremy Nosal ? ?Procedure(s) Performed: OPEN LEFT LATERAL HEPATECTOMY WITH INTRAOPERATIVE ULTRASOUND (Left: Abdomen) ?LAPAROSCOPY DIAGNOSTIC (Abdomen) ? ?  ? ?Patient location during evaluation: PACU ?Anesthesia Type: General ?Level of consciousness: awake and alert and oriented ?Pain management: pain level controlled ?Vital Signs Assessment: post-procedure vital signs reviewed and stable ?Respiratory status: spontaneous breathing, nonlabored ventilation and respiratory function stable ?Cardiovascular status: blood pressure returned to baseline and stable ?Postop Assessment: no apparent nausea or vomiting ?Anesthetic complications: no ? ? ?No notable events documented. ? ?Last Vitals:  ?Vitals:  ? 08/28/21 1010 08/28/21 1025  ?BP: 120/84 118/77  ?Pulse: 87 88  ?Resp: 14 15  ?Temp:    ?SpO2: 92% 91%  ?  ?Last Pain:  ?Vitals:  ? 08/28/21 1010  ?TempSrc:   ?PainSc: Asleep  ? ? ?  ?  ?  ?  ?  ?  ? ?Yoceline Bazar A. ? ? ? ? ?

## 2021-08-29 ENCOUNTER — Encounter (HOSPITAL_COMMUNITY): Payer: Self-pay | Admitting: Surgery

## 2021-08-29 LAB — TYPE AND SCREEN
ABO/RH(D): A NEG
Antibody Screen: NEGATIVE
Unit division: 0
Unit division: 0
Unit division: 0
Unit division: 0

## 2021-08-29 LAB — COMPREHENSIVE METABOLIC PANEL
ALT: 138 U/L — ABNORMAL HIGH (ref 0–44)
AST: 119 U/L — ABNORMAL HIGH (ref 15–41)
Albumin: 3.2 g/dL — ABNORMAL LOW (ref 3.5–5.0)
Alkaline Phosphatase: 102 U/L (ref 38–126)
Anion gap: 7 (ref 5–15)
BUN: 12 mg/dL (ref 6–20)
CO2: 22 mmol/L (ref 22–32)
Calcium: 9 mg/dL (ref 8.9–10.3)
Chloride: 106 mmol/L (ref 98–111)
Creatinine, Ser: 0.81 mg/dL (ref 0.61–1.24)
GFR, Estimated: >60 mL/min (ref >60)
Glucose, Bld: 142 mg/dL — ABNORMAL HIGH (ref 70–99)
Potassium: 4.3 mmol/L (ref 3.5–5.1)
Sodium: 135 mmol/L (ref 135–145)
Total Bilirubin: 1.2 mg/dL (ref 0.3–1.2)
Total Protein: 6.3 g/dL — ABNORMAL LOW (ref 6.5–8.1)

## 2021-08-29 LAB — BPAM RBC
Blood Product Expiration Date: 202304022359
Blood Product Expiration Date: 202304022359
Blood Product Expiration Date: 202304142359
Blood Product Expiration Date: 202304142359
ISSUE DATE / TIME: 202303141103
ISSUE DATE / TIME: 202303141103
Unit Type and Rh: 600
Unit Type and Rh: 600
Unit Type and Rh: 600
Unit Type and Rh: 600

## 2021-08-29 LAB — CBC
HCT: 36.8 % — ABNORMAL LOW (ref 39.0–52.0)
Hemoglobin: 12.4 g/dL — ABNORMAL LOW (ref 13.0–17.0)
MCH: 31.5 pg (ref 26.0–34.0)
MCHC: 33.7 g/dL (ref 30.0–36.0)
MCV: 93.4 fL (ref 80.0–100.0)
Platelets: 233 10*3/uL (ref 150–400)
RBC: 3.94 MIL/uL — ABNORMAL LOW (ref 4.22–5.81)
RDW: 14.7 % (ref 11.5–15.5)
WBC: 14.9 10*3/uL — ABNORMAL HIGH (ref 4.0–10.5)
nRBC: 0 % (ref 0.0–0.2)

## 2021-08-29 LAB — SURGICAL PATHOLOGY

## 2021-08-29 LAB — PHOSPHORUS: Phosphorus: 3.1 mg/dL (ref 2.5–4.6)

## 2021-08-29 MED ORDER — CHLORHEXIDINE GLUCONATE CLOTH 2 % EX PADS
6.0000 | MEDICATED_PAD | Freq: Every day | CUTANEOUS | Status: DC
  Administered 2021-08-29 – 2021-08-30 (×2): 6 via TOPICAL

## 2021-08-29 MED ORDER — ACETAMINOPHEN 500 MG PO TABS
1000.0000 mg | ORAL_TABLET | Freq: Three times a day (TID) | ORAL | Status: DC
  Administered 2021-08-29 – 2021-08-31 (×7): 1000 mg via ORAL
  Filled 2021-08-29 (×7): qty 2

## 2021-08-29 NOTE — Progress Notes (Signed)
Mobility Specia ? 08/29/21 1159  ?Mobility  ?Activity Ambulated independently in hallway  ?Level of Assistance Independent  ?Assistive Device None  ?Distance Ambulated (ft) 460 ft  ?Activity Response Tolerated well  ?$Mobility charge 1 Mobility  ? ?Pt received in bed and agreeable to mobility. C/o abdominal pain, mostly d/t coughing spell during mobility, no rating given. Pt to chair after session with all needs met.  ? ?James Coffey ?Mobility Specialist ?Mobility Specialist [place]: [phone] ?Mobility Specialist [place]: [phone] ? ?

## 2021-08-29 NOTE — Progress Notes (Signed)
? ? ?  1 Day Post-Op  ?Subjective: ?No acute issues. Pain controlled. Afebrile, stable vitals, good UOP.  ? ?Objective: ?Vital signs in last 24 hours: ?Temp:  [97.9 ?F (36.6 ?C)-98.7 ?F (37.1 ?C)] 98.4 ?F (36.9 ?C) (03/24 0326) ?Pulse Rate:  [71-89] 71 (03/24 0326) ?Resp:  [10-23] 20 (03/24 0326) ?BP: (105-152)/(69-92) 110/69 (03/24 0326) ?SpO2:  [90 %-98 %] 90 % (03/24 0326) ?Arterial Line BP: (132-143)/(60-64) 132/60 (03/23 1040) ?  ? ?Intake/Output from previous day: ?03/23 0701 - 03/24 0700 ?In: 2275.2 [P.O.:360; I.V.:1815.2; IV Piggyback:100] ?Out: 1200 [Urine:1100; Blood:100] ?Intake/Output this shift: ?No intake/output data recorded. ? ?PE: ?General: resting comfortably, NAD ?Neuro: alert and oriented, no focal deficits ?Resp: normal work of breathing on room air ?CV: RRR ?Abdomen: soft, nondistended, appropriately tender at incision. Midline incision is clean and dry with mild surrounding ecchymosis. LLQ colostomy is pink, no stool or gas in bag. ?Extremities: warm and well-perfused ? ? ?Lab Results:  ?Recent Labs  ?  08/28/21 ?1040 08/29/21 ?0307  ?WBC 15.4* 14.9*  ?HGB 12.8* 12.4*  ?HCT 39.6 36.8*  ?PLT 256 233  ? ?BMET ?Recent Labs  ?  08/28/21 ?1040 08/29/21 ?0307  ?NA 137 135  ?K 4.3 4.3  ?CL 109 106  ?CO2 23 22  ?GLUCOSE 121* 142*  ?BUN 9 12  ?CREATININE 0.83 0.81  ?CALCIUM 8.7* 9.0  ? ?PT/INR ?No results for input(s): LABPROT, INR in the last 72 hours. ?CMP  ?   ?Component Value Date/Time  ? NA 135 08/29/2021 0307  ? K 4.3 08/29/2021 0307  ? CL 106 08/29/2021 0307  ? CO2 22 08/29/2021 0307  ? GLUCOSE 142 (H) 08/29/2021 0307  ? BUN 12 08/29/2021 0307  ? CREATININE 0.81 08/29/2021 0307  ? CREATININE 0.76 08/14/2021 0858  ? CALCIUM 9.0 08/29/2021 0307  ? PROT 6.3 (L) 08/29/2021 2620  ? ALBUMIN 3.2 (L) 08/29/2021 3559  ? AST 119 (H) 08/29/2021 7416  ? AST 14 (L) 08/14/2021 0858  ? ALT 138 (H) 08/29/2021 0307  ? ALT 21 08/14/2021 0858  ? ALKPHOS 102 08/29/2021 0307  ? BILITOT 1.2 08/29/2021 0307  ? BILITOT  0.7 08/14/2021 0858  ? GFRNONAA >60 08/29/2021 0307  ? GFRNONAA >60 08/14/2021 0858  ? ?Lipase  ?   ?Component Value Date/Time  ? LIPASE 22 11/03/2020 1714  ? ? ? ? ? ?Studies/Results: ?IR Radiologist Eval & Mgmt ? ?Result Date: 08/27/2021 ?Please refer to notes tab for details about interventional procedure. (Op Note)  ? ? ? ?Assessment/Plan ?51 yo male with rectal cancer with metastases to the liver, POD1 s/p open partial left hepatectomy.  ?- Advance to regular diet, SLIV ?- Multimodal pain control: scheduled toradol, tylenol and robaxin; prn oxycodone and dilaudid ?- Remove foley catheter ?- Ambulate today - PT ordered ?- Bowel regimen ?- VTE: lovenox, SCDs ?- Dispo: transfer to med-surg floor (6N) ? ? ? LOS: 1 day  ? ? ?Michaelle Birks, MD ?[facility] ?General, Hepatobiliary and Pancreatic Surgery ?08/29/21 7:26 AM ? ?

## 2021-08-29 NOTE — Progress Notes (Signed)
Called 6N to give report. Nurse to call back for report number given to secretary.  ?

## 2021-08-29 NOTE — TOC Progression Note (Signed)
Transition of Care (TOC) - Progression Note  ? ? ?Patient Details  ?Name: James Coffey ?MRN: [id number] ?Date of Birth: 06-28-1970 ? ?Transition of Care (TOC) CM/SW Contact  ?Angelita Ingles, RN ?Phone Number:[phone] ? ?08/29/2021, 1:41 PM ? ?Clinical Narrative:    ? ?Transition of Care (TOC) Screening Note ? ? ?Patient Details  ?Name: James Coffey ?Date of Birth: 12/31/1970 ? ? ?Transition of Care (TOC) CM/SW Contact:    ?Angelita Ingles, RN ?Phone Number: ?08/29/2021, 1:41 PM ? ? ? ?Transition of Care Department [facility]) has reviewed patient and no TOC needs have been identified at this time. We will continue to monitor patient advancement through interdisciplinary progression rounds. If new patient transition needs arise, please place a TOC consult. ? ? ? ? ?  ?  ? ?Expected Discharge Plan and Services ?  ?  ?  ?  ?  ?                ?  ?  ?  ?  ?  ?  ?  ?  ?  ?  ? ? ?Social Determinants of Health (SDOH) Interventions ?  ? ?Readmission Risk Interventions ?   ? View : No data to display.  ?  ?  ?  ? ? ?

## 2021-08-29 NOTE — Progress Notes (Signed)
Per order foley catheter removed. Pt tolerated well. Educated pt on urinal and increasing oral fluids. Verbalized understanding.  ?

## 2021-08-29 NOTE — Evaluation (Signed)
Physical Therapy Evaluation ?Patient Details ?Name: James Coffey ?MRN: [id number] ?DOB: Feb 05, 1971 ?Today's Date: 08/29/2021 ? ?History of Present Illness ? 51 y.o. male with metastatic rectal cancer presents to [facility] hospital on 08/28/2021 for open left lateral hepatectomy. PMH includes fournier gangrene, perianal abscess, loop colostomy.  ?Clinical Impression ? Pt presents to PT with deficits in gait, power, activity tolerance. Pt is able to mobilize without physical assistance, benefiting from UE support of IV pole when ambulating at this time. PT anticipates the pt will not require the use of an assistive device if he continues to ambulate frequently during this admission. Acute PT to follow.   ?   ? ?Recommendations for follow up therapy are one component of a multi-disciplinary discharge planning process, led by the attending physician.  Recommendations may be updated based on patient status, additional functional criteria and insurance authorization. ? ?Follow Up Recommendations No PT follow up ? ?  ?Assistance Recommended at Discharge PRN  ?Patient can return home with the following ? A little help with walking and/or transfers;A little help with bathing/dressing/bathroom;Assistance with cooking/housework ? ?  ?Equipment Recommendations None recommended by PT  ?Recommendations for Other Services ?    ?  ?Functional Status Assessment Patient has had a recent decline in their functional status and demonstrates the ability to make significant improvements in function in a reasonable and predictable amount of time.  ? ?  ?Precautions / Restrictions Precautions ?Precautions: None ?Restrictions ?Weight Bearing Restrictions: No  ? ?  ? ?Mobility ? Bed Mobility ?Overal bed mobility: Needs Assistance ?Bed Mobility: Rolling, Sidelying to Sit ?Rolling: Supervision ?Sidelying to sit: Supervision, HOB elevated ?  ?  ?  ?General bed mobility comments: verbal cues for technique ?  ? ?Transfers ?Overall transfer level: Needs  assistance ?Equipment used: None ?Transfers: Sit to/from Stand ?Sit to Stand: Supervision ?  ?  ?  ?  ?  ?  ?  ? ?Ambulation/Gait ?Ambulation/Gait assistance: Supervision ?Gait Distance (Feet): 150 Feet ?Assistive device: IV Pole ?Gait Pattern/deviations: Step-through pattern ?Gait velocity: reduced ?Gait velocity interpretation: 1.31 - 2.62 ft/sec, indicative of limited community ambulator ?  ?General Gait Details: pt with slowed step-through gait ? ?Stairs ?  ?  ?  ?  ?  ? ?Wheelchair Mobility ?  ? ?Modified Rankin (Stroke Patients Only) ?  ? ?  ? ?Balance Overall balance assessment: Needs assistance ?Sitting-balance support: No upper extremity supported, Feet supported ?Sitting balance-Leahy Scale: Good ?  ?  ?Standing balance support: Single extremity supported, Reliant on assistive device for balance ?Standing balance-Leahy Scale: Poor ?Standing balance comment: UE support of IV during ambulation ?  ?  ?  ?  ?  ?  ?  ?  ?  ?  ?  ?   ? ? ? ?Pertinent Vitals/Pain Pain Assessment ?Pain Assessment: 0-10 ?Pain Score: 6  ?Pain Location: abdomen ?Pain Descriptors / Indicators: Sore ?Pain Intervention(s): Monitored during session  ? ? ?Home Living Family/patient expects to be discharged to:: Private residence ?Living Arrangements: Spouse/significant other;Children ?Available Help at Discharge: Family ?Type of Home: House ?Home Access: Level entry ?  ?  ?  ?Home Layout: One level ?Home Equipment: None ?   ?  ?Prior Function Prior Level of Function : Independent/Modified Independent ?  ?  ?  ?  ?  ?  ?  ?  ?  ? ? ?Hand Dominance  ?   ? ?  ?Extremity/Trunk Assessment  ? Upper Extremity Assessment ?Upper Extremity Assessment: Overall WFL for tasks  assessed ?  ? ?Lower Extremity Assessment ?Lower Extremity Assessment: Overall WFL for tasks assessed ?  ? ?Cervical / Trunk Assessment ?Cervical / Trunk Assessment: Normal  ?Communication  ? Communication: No difficulties  ?Cognition Arousal/Alertness: Awake/alert ?Behavior  During Therapy: [facility] for tasks assessed/performed ?Overall Cognitive Status: Within Functional Limits for tasks assessed ?  ?  ?  ?  ?  ?  ?  ?  ?  ?  ?  ?  ?  ?  ?  ?  ?  ?  ?  ? ?  ?General Comments General comments (skin integrity, edema, etc.): VSS on RA, sats from 88-94% during session ? ?  ?Exercises    ? ?Assessment/Plan  ?  ?PT Assessment Patient needs continued PT services  ?PT Problem List Decreased activity tolerance;Decreased balance;Decreased mobility;Pain ? ?   ?  ?PT Treatment Interventions DME instruction;Gait training;Functional mobility training;Therapeutic activities;Therapeutic exercise;Balance training;Patient/family education   ? ?PT Goals (Current goals can be found in the Care Plan section)  ?Acute Rehab PT Goals ?Patient Stated Goal: to return to prior level of function ?PT Goal Formulation: With patient ?Time For Goal Achievement: 09/12/21 ?Potential to Achieve Goals: Good ? ?  ?Frequency Min 3X/week ?  ? ? ?Co-evaluation   ?  ?  ?  ?  ? ? ?  ?AM-PAC PT "6 Clicks" Mobility  ?Outcome Measure Help needed turning from your back to your side while in a flat bed without using bedrails?: A Little ?Help needed moving from lying on your back to sitting on the side of a flat bed without using bedrails?: A Little ?Help needed moving to and from a bed to a chair (including a wheelchair)?: A Little ?Help needed standing up from a chair using your arms (e.g., wheelchair or bedside chair)?: A Little ?Help needed to walk in hospital room?: A Little ?Help needed climbing 3-5 steps with a railing? : A Little ?6 Click Score: 18 ? ?  ?End of Session   ?Activity Tolerance: Patient tolerated treatment well ?Patient left: in chair;with call bell/phone within reach ?Nurse Communication: Mobility status ?PT Visit Diagnosis: Other abnormalities of gait and mobility (R26.89);Pain ?Pain - Right/Left: Left ?Pain - part of body:  (abdomen) ?  ? ?Time: 7893-8101 ?PT Time Calculation (min) (ACUTE ONLY): 13  min ? ? ?Charges:   PT Evaluation ?$PT Eval Low Complexity: 1 Low ?  ?  ?   ? ? ?Zenaida Niece, PT, DPT ?Acute Rehabilitation ?Pager: [phone] ?Office [phone] ? ? ?Zenaida Niece ?08/29/2021, 9:16 AM ? ?

## 2021-08-29 NOTE — Progress Notes (Addendum)
Received pt in room from the previous floor, report received from [place].  No complaints of pain or discomfort.  Pt put to bed.  IVs in both arms flushed well with blood return.   HOB up and pt watching tv at the moment.  Requested apple juice and was given to him. ?

## 2021-08-30 LAB — CBC
HCT: 34.2 % — ABNORMAL LOW (ref 39.0–52.0)
Hemoglobin: 11.5 g/dL — ABNORMAL LOW (ref 13.0–17.0)
MCH: 31.9 pg (ref 26.0–34.0)
MCHC: 33.6 g/dL (ref 30.0–36.0)
MCV: 95 fL (ref 80.0–100.0)
Platelets: 200 10*3/uL (ref 150–400)
RBC: 3.6 MIL/uL — ABNORMAL LOW (ref 4.22–5.81)
RDW: 14.9 % (ref 11.5–15.5)
WBC: 11.1 10*3/uL — ABNORMAL HIGH (ref 4.0–10.5)
nRBC: 0 % (ref 0.0–0.2)

## 2021-08-30 LAB — COMPREHENSIVE METABOLIC PANEL
ALT: 183 U/L — ABNORMAL HIGH (ref 0–44)
AST: 98 U/L — ABNORMAL HIGH (ref 15–41)
Albumin: 3.1 g/dL — ABNORMAL LOW (ref 3.5–5.0)
Alkaline Phosphatase: 91 U/L (ref 38–126)
Anion gap: 7 (ref 5–15)
BUN: 12 mg/dL (ref 6–20)
CO2: 24 mmol/L (ref 22–32)
Calcium: 8.8 mg/dL — ABNORMAL LOW (ref 8.9–10.3)
Chloride: 110 mmol/L (ref 98–111)
Creatinine, Ser: 0.9 mg/dL (ref 0.61–1.24)
GFR, Estimated: >60 mL/min (ref >60)
Glucose, Bld: 128 mg/dL — ABNORMAL HIGH (ref 70–99)
Potassium: 3.8 mmol/L (ref 3.5–5.1)
Sodium: 141 mmol/L (ref 135–145)
Total Bilirubin: 0.6 mg/dL (ref 0.3–1.2)
Total Protein: 6 g/dL — ABNORMAL LOW (ref 6.5–8.1)

## 2021-08-30 LAB — PHOSPHORUS: Phosphorus: 2.6 mg/dL (ref 2.5–4.6)

## 2021-08-30 NOTE — Progress Notes (Signed)
Mobility Specialist: Progress Note ? ? 08/30/21 1631  ?Mobility  ?Activity Ambulated independently in hallway  ?Level of Assistance Independent  ?Assistive Device None  ?Distance Ambulated (ft) 550 ft  ?Activity Response Tolerated well  ?$Mobility charge 1 Mobility  ? ?Pt received in bed and agreeable to ambulation. C/o minimal abdominal pain during ambulation, otherwise asymptomatic. Pt to recliner after session with call bell and phone in reach. All needs met. ? ?  ?Mobility Specialist ?Mobility Specialist [address]: [phone] ?Mobility Specialist [address]: [phone] ? ?

## 2021-08-30 NOTE — Progress Notes (Signed)
? ? ?  2 Days Post-Op  ?Subjective: ?Remains stable. No acute complaints this morning. Pain controlled. Eating, ambulated 3-4 times yesterday. No colostomy function since surgery.  ? ?Objective: ?Vital signs in last 24 hours: ?Temp:  [97.7 ?F (36.5 ?C)-98.3 ?F (36.8 ?C)] 98 ?F (36.7 ?C) (03/25 0418) ?Pulse Rate:  [76-86] 76 (03/25 0418) ?Resp:  [17-19] 17 (03/25 0418) ?BP: (106-126)/(72-78) 126/78 (03/25 0418) ?SpO2:  [90 %-98 %] 98 % (03/25 0418) ?  ? ?Intake/Output from previous day: ?03/24 0701 - 03/25 0700 ?In: 24 [P.O.:640] ?Out: 8563 [Urine:1650] ?Intake/Output this shift: ?Total I/O ?In: 400 [P.O.:400] ?Out: 1350 [JSHFW:[id number]] ? ?PE: ?General: resting comfortably, NAD ?Neuro: alert and oriented, no focal deficits ?Resp: normal work of breathing on room air ?Abdomen: soft, mildly distended, appropriately tender at incision. Midline incision is clean and dry with mild surrounding ecchymosis. LLQ colostomy is pink and protuberant, no stool or gas in bag. ?Extremities: warm and well-perfused ? ? ?Lab Results:  ?Recent Labs  ?  08/29/21 ?0307 08/30/21 ?0118  ?WBC 14.9* 11.1*  ?HGB 12.4* 11.5*  ?HCT 36.8* 34.2*  ?PLT 233 200  ? ?BMET ?Recent Labs  ?  08/29/21 ?0307 08/30/21 ?0118  ?NA 135 141  ?K 4.3 3.8  ?CL 106 110  ?CO2 22 24  ?GLUCOSE 142* 128*  ?BUN 12 12  ?CREATININE 0.81 0.90  ?CALCIUM 9.0 8.8*  ? ?PT/INR ?No results for input(s): LABPROT, INR in the last 72 hours. ?CMP  ?   ?Component Value Date/Time  ? NA 141 08/30/2021 0118  ? K 3.8 08/30/2021 0118  ? CL 110 08/30/2021 0118  ? CO2 24 08/30/2021 0118  ? GLUCOSE 128 (H) 08/30/2021 0118  ? BUN 12 08/30/2021 0118  ? CREATININE 0.90 08/30/2021 0118  ? CREATININE 0.76 08/14/2021 0858  ? CALCIUM 8.8 (L) 08/30/2021 0118  ? PROT 6.0 (L) 08/30/2021 0118  ? ALBUMIN 3.1 (L) 08/30/2021 0118  ? AST 98 (H) 08/30/2021 0118  ? AST 14 (L) 08/14/2021 0858  ? ALT 183 (H) 08/30/2021 0118  ? ALT 21 08/14/2021 0858  ? ALKPHOS 91 08/30/2021 0118  ? BILITOT 0.6 08/30/2021 0118  ?  BILITOT 0.7 08/14/2021 0858  ? GFRNONAA >60 08/30/2021 0118  ? GFRNONAA >60 08/14/2021 0858  ? ?Lipase  ?   ?Component Value Date/Time  ? LIPASE 22 11/03/2020 1714  ? ? ? ? ? ?Studies/Results: ?No results found. ? ? ? ?Assessment/Plan ?51 yo male with rectal cancer with metastases to the liver, POD2 s/p open partial left hepatectomy.  ?- Mild abdominal distension - possible early ileus, no symptoms of nausea or vomiting. Awaiting return of bowel function. Continue regular diet as tolerated. ?- Multimodal pain control: scheduled toradol, tylenol and robaxin; prn oxycodone and dilaudid ?- Continue frequent ambulation ?- Bowel regimen - colace ?- VTE: lovenox, SCDs ?- Dispo: med-surg floor ? ? ? LOS: 2 days  ? ? ?Michaelle Birks, MD ?[facility] ?General, Hepatobiliary and Pancreatic Surgery ?08/30/21 6:48 AM ? ?

## 2021-08-30 NOTE — Progress Notes (Signed)
Physical Therapy Discharge ?Patient Details ?Name: James Coffey ?MRN: [id number] ?DOB: [birth year] ?Today's Date: 08/30/2021 ?Time: 6967-8938 ?PT Time Calculation (min) (ACUTE ONLY): 10 min ? ?Patient discharged from PT services secondary to goals met and no further PT needs identified. ? ?Please see latest therapy progress note for current level of functioning and progress toward goals.   ? ?Progress and discharge plan discussed with patient and/or caregiver: Patient/Caregiver agrees with plan ? ?Benjiman Core, PTA ?Pager [phone] ?Acute Rehab ?   ?Allena Katz ?08/30/2021, 1:21 PM  ?

## 2021-08-30 NOTE — Progress Notes (Signed)
Physical Therapy Treatment ?Patient Details ?Name: James Coffey ?MRN: [id number] ?DOB: Jun 12, 1970 ?Today's Date: 08/30/2021 ? ? ?History of Present Illness 51 y.o. male with metastatic rectal cancer presents to [facility] hospital on 08/28/2021 for open left lateral hepatectomy. PMH includes fournier gangrene, perianal abscess, loop colostomy. ? ?  ?PT Comments  ? ? Pt has met goals. He reports and mobility tech confirms that pt has been up ambulating several times a day independently in the hallways. No further PT needs identified. Appropriate to d/c from PT services. D/c plan discussed with evaluating PT.  ?  ?Recommendations for follow up therapy are one component of a multi-disciplinary discharge planning process, led by the attending physician.  Recommendations may be updated based on patient status, additional functional criteria and insurance authorization. ? ?Follow Up Recommendations ? No PT follow up ?  ?  ?Assistance Recommended at Discharge PRN  ?Patient can return home with the following Assistance with cooking/housework ?  ?Equipment Recommendations ? None recommended by PT  ?  ?Recommendations for Other Services   ? ? ?  ?Precautions / Restrictions Precautions ?Precautions: None ?Restrictions ?Weight Bearing Restrictions: No  ?  ? ?Mobility ? Bed Mobility ?Overal bed mobility: Independent ?  ?  ?  ?  ?  ?  ?General bed mobility comments: able to progress OOB with HOB lowered and no bed rails. Slightly increased time and effort. ?  ? ?Transfers ?Overall transfer level: Independent ?  ?Transfers: Sit to/from Stand ?  ?  ?  ?  ?  ?  ?  ?  ? ?Ambulation/Gait ?Ambulation/Gait assistance: Independent ?Gait Distance (Feet): 150 Feet ?  ?Gait Pattern/deviations: Step-through pattern ?  ?  ?  ?General Gait Details: Pt mobilizing well. Reports he has been up independently several times today to walk the halls. ? ? ?Stairs ?  ?  ?  ?  ?  ? ? ?Wheelchair Mobility ?  ? ?Modified Rankin (Stroke Patients Only) ?  ? ? ?   ?Balance Overall balance assessment: Needs assistance ?Sitting-balance support: No upper extremity supported, Feet supported ?Sitting balance-Leahy Scale: Good ?  ?  ?Standing balance support: Single extremity supported, Reliant on assistive device for balance ?Standing balance-Leahy Scale: Good ?Standing balance comment: ambulating with out support in halls. ?  ?  ?  ?  ?  ?  ?  ?  ?  ?  ?  ?  ? ?  ?Cognition Arousal/Alertness: Awake/alert ?Behavior During Therapy: [facility] for tasks assessed/performed ?Overall Cognitive Status: Within Functional Limits for tasks assessed ?  ?  ?  ?  ?  ?  ?  ?  ?  ?  ?  ?  ?  ?  ?  ?  ?  ?  ?  ? ?  ?Exercises   ? ?  ?General Comments   ?  ?  ? ?Pertinent Vitals/Pain Pain Assessment ?Pain Assessment: No/denies pain  ? ? ?Home Living   ?  ?  ?  ?  ?  ?  ?  ?  ?  ?   ?  ?Prior Function    ?  ?  ?   ? ?PT Goals (current goals can now be found in the care plan section) Acute Rehab PT Goals ?Patient Stated Goal: to return to prior level of function ?PT Goal Formulation: With patient ?Time For Goal Achievement: 09/12/21 ?Potential to Achieve Goals: Good ?Progress towards PT goals: Goals met/education completed, patient discharged from PT ? ?  ?Frequency ? ? ?  Min 3X/week ? ? ? ?  ?PT Plan Other (comment) (d/c from PT)  ? ? ?Co-evaluation   ?  ?  ?  ?  ? ?  ?AM-PAC PT "6 Clicks" Mobility   ?Outcome Measure ? Help needed turning from your back to your side while in a flat bed without using bedrails?: None ?Help needed moving from lying on your back to sitting on the side of a flat bed without using bedrails?: None ?Help needed moving to and from a bed to a chair (including a wheelchair)?: None ?Help needed standing up from a chair using your arms (e.g., wheelchair or bedside chair)?: None ?Help needed to walk in hospital room?: None ?Help needed climbing 3-5 steps with a railing? : A Little ?6 Click Score: 23 ? ?  ?End of Session   ?Activity Tolerance: Patient tolerated treatment well ?Patient  left: in chair;with call bell/phone within reach ?Nurse Communication: Mobility status ?PT Visit Diagnosis: Other abnormalities of gait and mobility (R26.89);Pain ?Pain - Right/Left: Left ?Pain - part of body:  (abdomen) ?  ? ? ?Time: 9987-2158 ?PT Time Calculation (min) (ACUTE ONLY): 10 min ? ?Charges:  $Gait Training: 8-22 mins          ?          ? ?Benjiman Core, PTA ?Pager [phone] ?Acute Rehab ? ?James Coffey ?08/30/2021, 1:22 PM ? ?

## 2021-08-31 LAB — CBC
HCT: 35.7 % — ABNORMAL LOW (ref 39.0–52.0)
Hemoglobin: 11.6 g/dL — ABNORMAL LOW (ref 13.0–17.0)
MCH: 31.4 pg (ref 26.0–34.0)
MCHC: 32.5 g/dL (ref 30.0–36.0)
MCV: 96.7 fL (ref 80.0–100.0)
Platelets: 204 10*3/uL (ref 150–400)
RBC: 3.69 MIL/uL — ABNORMAL LOW (ref 4.22–5.81)
RDW: 15.1 % (ref 11.5–15.5)
WBC: 11.6 10*3/uL — ABNORMAL HIGH (ref 4.0–10.5)
nRBC: 0 % (ref 0.0–0.2)

## 2021-08-31 LAB — COMPREHENSIVE METABOLIC PANEL
ALT: 127 U/L — ABNORMAL HIGH (ref 0–44)
AST: 38 U/L (ref 15–41)
Albumin: 2.9 g/dL — ABNORMAL LOW (ref 3.5–5.0)
Alkaline Phosphatase: 98 U/L (ref 38–126)
Anion gap: 8 (ref 5–15)
BUN: 12 mg/dL (ref 6–20)
CO2: 23 mmol/L (ref 22–32)
Calcium: 8.8 mg/dL — ABNORMAL LOW (ref 8.9–10.3)
Chloride: 110 mmol/L (ref 98–111)
Creatinine, Ser: 0.75 mg/dL (ref 0.61–1.24)
GFR, Estimated: >60 mL/min (ref >60)
Glucose, Bld: 113 mg/dL — ABNORMAL HIGH (ref 70–99)
Potassium: 3.8 mmol/L (ref 3.5–5.1)
Sodium: 141 mmol/L (ref 135–145)
Total Bilirubin: 0.8 mg/dL (ref 0.3–1.2)
Total Protein: 6.1 g/dL — ABNORMAL LOW (ref 6.5–8.1)

## 2021-08-31 LAB — PHOSPHORUS: Phosphorus: 3.6 mg/dL (ref 2.5–4.6)

## 2021-08-31 MED ORDER — DOCUSATE SODIUM 100 MG PO CAPS: 100.0000 mg | ORAL_CAPSULE | Freq: Two times a day (BID) | ORAL | 0 refills | Status: DC

## 2021-08-31 MED ORDER — METHOCARBAMOL 500 MG PO TABS: 500.0000 mg | ORAL_TABLET | Freq: Four times a day (QID) | ORAL | 0 refills | Status: AC | PRN

## 2021-08-31 MED ORDER — OXYCODONE HCL 5 MG PO TABS: 5.0000 mg | ORAL_TABLET | ORAL | 0 refills | Status: AC | PRN

## 2021-08-31 NOTE — Discharge Instructions (Addendum)
[facility] [place] [facility] DISCHARGE INSTRUCTIONS ? ?Activity ?No heavy lifting greater than 15 pounds for 6 weeks after surgery. ?Ok to shower, but do not bathe or submerge incisions underwater. ?Do not drive while taking narcotic pain medication. ? ?Wound Care ?Your incision is covered with skin glue called Dermabond. This will peel off on its own over time. ?You may shower and allow warm soapy water to run over your incision. Gently pat dry. ?Do not submerge your incision underwater. ?Monitor your incision for any new redness, tenderness, or drainage. ? ?When to Call us: ?Fever greater than 100.5 ?New redness, drainage, or swelling at incision site ?Severe pain, nausea, or vomiting ?Jaundice (yellowing of the whites of the eyes or skin) ? ?Follow-up ?You have an appointment scheduled with Dr. [name] on September 22, 2021 at 9:35am. This will be at the [facility] Surgery office at [address] [place], [place], [place]. Please arrive at least 15 minutes prior to your scheduled appointment time. ? ?For questions or concerns, please call the office at [phone]. ? ?

## 2021-08-31 NOTE — Discharge Summary (Signed)
Physician Discharge Summary  ? ?Patient ID: ?James Coffey ?[id number] ?51 y.o. ?Jun 11, 1970 ? ?Admit date: 08/28/2021 ? ?Discharge date and time: 08/31/2021 ? ?Admitting Physician: Dwan Bolt, MD  ? ?Discharge Physician: Michaelle Birks ? 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 ?Discharge Diagnoses: Rectal cancer with metastatic disease to liver ? ?Admission Condition: good ? ?Discharged Condition: good ? ?Indication for Admission: James Coffey is a 51 yo male with rectal cancer with metastases to the liver. He was treated with neoadjuvant FOLFIRINOX, with stable size of the two liver lesions on restaging scans. The decision was made to proceed with resection of the tumor in the left lateral liver. ? ?Hospital Course: The patient was taken to the OR on 3/23 for a staging laparoscopy and open partial left hepatectomy.  For further details of the procedure please see separately dictated operative note.  Postoperatively the patient was admitted to the progressive care unit in stable condition.  He was started on a clear liquid diet, which he tolerated with no nausea or vomiting.  On the morning of POD 1, his Foley was removed and he was able to void.  He was advanced to a regular diet.  He was transferred to the [place] floor.  His transaminases appropriately down trended over the next few days.  He was evaluated by physical therapy and was able to ambulate independently multiple times daily.  On the morning of POD 3, he was tolerating a regular diet, his pain was controlled on oral medications, and he was ambulating without difficulty.  His colostomy was productive of gas.  He was examined and deemed appropriate for discharge home.  He will have outpatient surgery follow-up in a few weeks and is also scheduled to see IR for ablation of the right liver tumor. ? ?Consults: None ? ?Significant Diagnostic Studies:  ?Surgical Pathology: ?A. LIVER MASS, LEFT,  RESECTION:  ?- Metastatic carcinoma to liver, consistent with patient's clinical  ?history of primary rectal carcinoma  ? ?Treatments: IV hydration, analgesia: acetaminophen, Dilaudid, and oxycodone and robaxin; therapies: PT, and surgery: Staging laparoscopy, open partial left hepatectomy ? ?Discharge Exam: ?General: resting comfortably, NAD ?Neuro: alert and oriented, no focal deficits ?Resp: normal work of breathing on room air ?Abdomen: soft, nondistended, appropriately to palpation.  Midline incision clean, dry, and intact with mild surrounding ecchymosis.  LLQ colostomy is pink, protuberant, and productive of gas. ?Extremities: warm and well-perfused ? ? ?Disposition: Discharge disposition: 01-Home or Self Care ? ? ? ? ? ? ?Patient Instructions:  ?Allergies as of 08/31/2021   ? ?   Reactions  ? Hydrocodone Itching  ? ?  ? ?  ?Medication List  ?  ? ?STOP taking these medications   ? ?traMADol 50 MG tablet ?Commonly known as: ULTRAM ?  ? ?  ? ?TAKE these medications   ? ?acetaminophen 500 MG tablet ?Commonly known as: TYLENOL ?Take 2 tablets (1,000 mg total) by mouth every 8 (eight) hours as needed for mild pain or fever. ?  ?dexamethasone 4 MG tablet ?Commonly known as: DECADRON ?Take 2 tablets (8 mg total) by mouth daily. Start the day after chemotherapy for 3 days. Take with food. ?  ?docusate sodium 100 MG capsule ?Commonly known as: COLACE ?Take 1 capsule (100 mg total) by mouth 2 (two) times daily. ?  ?dronabinol 2.5 MG capsule ?Commonly known as: MARINOL ?Take 1 capsule by mouth 2 times daily before a meal. ?  ?ibuprofen 200 MG  tablet ?Commonly known as: ADVIL ?Take 400 mg by mouth every 6 (six) hours as needed. ?  ?lidocaine-prilocaine cream ?Commonly known as: EMLA ?Apply to affected area once ?  ?loperamide 2 MG capsule ?Commonly known as: IMODIUM ?Take 2 tablets by mouth at onset of diarrhea, then 1 tab every 2hrs until 12hr without a BM. May take 2 tab every 4hrs at bedtime. If diarrhea recurs  repeat. ?  ?LORazepam 0.5 MG tablet ?Commonly known as: Ativan ?Take 1 tablet by mouth every 6 hours as needed for anxiety. ?  ?methocarbamol 500 MG tablet ?Commonly known as: ROBAXIN ?Take 1 tablet (500 mg total) by mouth every 6 (six) hours as needed for up to 5 days for muscle spasms. ?  ?ondansetron 8 MG tablet ?Commonly known as: Zofran ?Take 1 tablet (8 mg total) by mouth 2 (two) times daily as needed. Start on day 3 after chemotherapy. ?What changed: reasons to take this ?  ?oxyCODONE 5 MG immediate release tablet ?Commonly known as: Oxy IR/ROXICODONE ?Take 1 tablet (5 mg total) by mouth every 4 (four) hours as needed for up to 5 days for severe pain. ?  ?prochlorperazine 10 MG tablet ?Commonly known as: COMPAZINE ?Take 1 tablet (10 mg total) by mouth every 6 (six) hours as needed for nausea or vomiting. ?  ? ?  ? ?Activity: no driving while on analgesics and no heavy lifting for 6 weeks ?Diet: regular diet ?Wound Care: none needed ? ?Follow-up with Dr. [name] on 09/22/21. ? ?Signed: ?Dwan Bolt ?08/31/2021 ?7:31 AM ? ?

## 2021-08-31 NOTE — Progress Notes (Signed)
Patient is ambulating frequently, pain controlled, tolerating diet, and colostomy is productive of gas. He wishes to go home today. Will discharge, follow up already scheduled in 3 weeks. ?

## 2021-08-31 NOTE — Plan of Care (Signed)

## 2021-08-31 NOTE — Progress Notes (Signed)
AVS given and explained to patient, awaiting for ride.  

## 2021-09-01 ENCOUNTER — Other Ambulatory Visit: Payer: Self-pay | Admitting: Interventional Radiology

## 2021-09-01 DIAGNOSIS — C20 Malignant neoplasm of rectum: Secondary | ICD-10-CM

## 2021-09-03 ENCOUNTER — Other Ambulatory Visit: Payer: Self-pay | Admitting: Interventional Radiology

## 2021-09-03 DIAGNOSIS — C787 Secondary malignant neoplasm of liver and intrahepatic bile duct: Secondary | ICD-10-CM

## 2021-09-12 ENCOUNTER — Encounter: Payer: Self-pay | Admitting: *Deleted

## 2021-09-12 ENCOUNTER — Ambulatory Visit
Admission: RE | Admit: 2021-09-12 | Discharge: 2021-09-12 | Disposition: A | Discharge status: Home / Self Care | Payer: Medicaid Other | Source: Ambulatory Visit | Attending: Interventional Radiology | Admitting: Interventional Radiology

## 2021-09-12 DIAGNOSIS — C20 Malignant neoplasm of rectum: Secondary | ICD-10-CM

## 2021-09-12 HISTORY — PX: IR RADIOLOGIST EVAL & MGMT: IMG5224

## 2021-09-12 NOTE — Progress Notes (Signed)
? ? ?Chief Complaint: ?Patient was consulted remotely today (TeleHealth) for  ? ?History of Present Illness: ?James Coffey is a 51 y.o. male seen previously on 08/27/2021 for percutaneous thermal ablation of a right lobe hepatic metastasis from rectal carcinoma.  He is now status post resection of a segment III left lobe metastatic lesion by Dr. [name] on 08/28/2021 which she tolerated very well.  I was able to review intraoperative ultrasound imaging of the liver with Dr. [name] during surgery which clearly delineates the more central right lobe liver lesion in segment V measuring approximately 1.4 cm. He has recovered well at home and is now asymptomatic. ? ? ?Past Medical History:  ?Diagnosis Date  ? Cancer [facility])   ? Tobacco abuse   ? ? ?Past Surgical History:  ?Procedure Laterality Date  ? HERNIA REPAIR    ? INCISION AND DRAINAGE ABSCESS N/A 12/01/2020  ? Procedure: INCISION AND DRAINAGE ABSCESS;  Surgeon: Michael Boston, MD;  [facility];  Service: General;  Laterality: N/A;  ? INCISION AND DRAINAGE ABSCESS N/A 12/04/2020  ? Procedure: INCISION AND DRAINAGE FOURNIERS GANGRENE;  Surgeon: [name], MD;  [facility];  Service: General;  Laterality: N/A;  ? INCISION AND DRAINAGE PERIRECTAL ABSCESS N/A 11/30/2020  ? Procedure: INCISION AND DEBRIDEMENT OF PERIRECTAL ABSCESS AND RECTAL WALL BIOPSIES;  Surgeon: Michael Boston, MD;  [facility];  Service: General;  Laterality: N/A;  ? INGUINAL HERNIA REPAIR Right 2007  ? IR IMAGING GUIDED PORT INSERTION  05/07/2021  ? IR RADIOLOGIST EVAL & MGMT  08/27/2021  ? IR RADIOLOGIST EVAL & MGMT  09/12/2021  ? LAPAROSCOPIC LOOP COLOSTOMY N/A 12/13/2020  ? Procedure: DIAGNOSTIC LAPAROSCOPY WITH  OPEN DESCENDING LOOP COLOSTOMY;  Surgeon: Armandina Gemma, MD;  [facility];  Service: General;  Laterality: N/A;  ? LAPAROSCOPY N/A 08/28/2021  ? Procedure: LAPAROSCOPY DIAGNOSTIC;  Surgeon: Dwan Bolt, MD;  Location: [place];  Service: General;  Laterality: N/A;  ?  OPEN PARTIAL HEPATECTOMY  Left 08/28/2021  ? Procedure: OPEN LEFT LATERAL HEPATECTOMY WITH INTRAOPERATIVE ULTRASOUND;  Surgeon: Dwan Bolt, MD;  Location: [place];  Service: General;  Laterality: Left;  ? RECTAL SURGERY    ? ? ?Allergies: ?Hydrocodone ? ?Medications: ?Prior to Admission medications   ?Medication Sig Start Date End Date Taking? Authorizing Provider  ?acetaminophen (TYLENOL) 500 MG tablet Take 2 tablets (1,000 mg total) by mouth every 8 (eight) hours as needed for mild pain or fever. 12/19/20   Norm Parcel, PA-C  ?dexamethasone (DECADRON) 4 MG tablet Take 2 tablets (8 mg total) by mouth daily. Start the day after chemotherapy for 3 days. Take with food. ?Patient not taking: Reported on 08/26/2021 05/07/21   [name], MD  ?docusate sodium (COLACE) 100 MG capsule Take 1 capsule (100 mg total) by mouth 2 (two) times daily. 08/31/21   Dwan Bolt, MD  ?dronabinol (MARINOL) 2.5 MG capsule Take 1 capsule by mouth 2 times daily before a meal. ?Patient not taking: Reported on 08/26/2021 07/03/21   [name], MD  ?ibuprofen (ADVIL) 200 MG tablet Take 400 mg by mouth every 6 (six) hours as needed.    [provider]  ?lidocaine-prilocaine (EMLA) cream Apply to affected area once 05/07/21   [name], MD  ?loperamide (IMODIUM) 2 MG capsule Take 2 tablets by mouth at onset of diarrhea, then 1 tab every 2hrs until 12hr without a BM. May take 2 tab every 4hrs at bedtime. If diarrhea recurs  repeat. 05/07/21   [name], MD  ?LORazepam (ATIVAN) 0.5 MG tablet Take 1 tablet by mouth every 6 hours as needed for anxiety. 07/17/21   [name], MD  ?ondansetron (ZOFRAN) 8 MG tablet Take 1 tablet (8 mg total) by mouth 2 (two) times daily as needed. Start on day 3 after chemotherapy. ?Patient taking differently: Take 8 mg by mouth 2 (two) times daily as needed for nausea or vomiting. Start on day 3 after chemotherapy. 06/19/21   [name], MD   ?prochlorperazine (COMPAZINE) 10 MG tablet Take 1 tablet (10 mg total) by mouth every 6 (six) hours as needed for nausea or vomiting. 07/17/21   [name], MD  ?  ? ?Family History  ?Problem Relation Age of Onset  ? CAD Neg Hx   ? Inflammatory bowel disease Neg Hx   ? ? ?Social History  ? ?Socioeconomic History  ? Marital status: Married  ?  Spouse name: Not on file  ? Number of children: Not on file  ? Years of education: Not on file  ? Highest education level: Not on file  ?Occupational History  ? Occupation: truck Geophysicist/field seismologist  ?Tobacco Use  ? Smoking status: Every Day  ?  Packs/day: 0.25  ?  Types: Cigarettes  ? Smokeless tobacco: Never  ?Vaping Use  ? Vaping Use: Never used  ?Substance and Sexual Activity  ? Alcohol use: No  ? Drug use: No  ? Sexual activity: Never  ?Other Topics Concern  ? Not on file  ?Social History Narrative  ? From [place].  Came to the [place] in 2000.  ? ?Social Determinants of Health  ? ?Financial Resource Strain: Not on file  ?Food Insecurity: Not on file  ?Transportation Needs: Not on file  ?Physical Activity: Not on file  ?Stress: Not on file  ?Social Connections: Not on file  ? ? ?Review of Systems  ?Constitutional: Negative.   ?Respiratory: Negative.    ?Cardiovascular: Negative.   ?Gastrointestinal: Negative.   ?Genitourinary: Negative.   ?Musculoskeletal: Negative.   ?Neurological: Negative.   ? ?Review of Systems: A 12 point ROS discussed and pertinent positives are indicated in the HPI above.  All other systems are negative. ? ?Physical Exam ?No direct physical exam was performed (except for noted visual exam findings with Video Visits).  ? ?Vital Signs: ?There were no vitals taken for this visit. ? ?Imaging: ?IR Radiologist Eval & Mgmt ? ?Result Date: 09/12/2021 ?Please refer to notes tab for details about interventional procedure. (Op Note) ? ?IR Radiologist Eval & Mgmt ? ?Result Date: 08/27/2021 ?Please refer to notes tab for details about interventional procedure. (Op Note)   ? ?Labs: ? ?CBC: ?Recent Labs  ?  08/28/21 ?1040 08/29/21 ?0307 08/30/21 ?0118 08/31/21 ?0208  ?WBC 15.4* 14.9* 11.1* 11.6*  ?HGB 12.8* 12.4* 11.5* 11.6*  ?HCT 39.6 36.8* 34.2* 35.7*  ?PLT 256 233 200 204  ? ? ?COAGS: ?No results for input(s): INR, APTT in the last 8760 hours. ? ?BMP: ?Recent Labs  ?  08/28/21 ?1040 08/29/21 ?0307 08/30/21 ?0118 08/31/21 ?0208  ?NA 137 135 141 141  ?K 4.3 4.3 3.8 3.8  ?CL 109 106 110 110  ?CO2 '23 22 24 23  '$ ?GLUCOSE 121* 142* 128* 113*  ?BUN '9 12 12 12  '$ ?CALCIUM 8.7* 9.0 8.8* 8.8*  ?CREATININE 0.83 0.81 0.90 0.75  ?GFRNONAA >60 >60 >60 >60  ? ? ?LIVER FUNCTION TESTS: ?Recent Labs  ?  08/28/21 ?1040 08/29/21 ?0307 08/30/21 ?  6503 08/31/21 ?0208  ?BILITOT 1.0 1.2 0.6 0.8  ?AST 116* 119* 98* 38  ?ALT 91* 138* 183* 127*  ?ALKPHOS 115 102 91 98  ?PROT 6.6 6.3* 6.0* 6.1*  ?ALBUMIN 3.4* 3.2* 3.1* 2.9*  ? ? ? ?Assessment and Plan: ? ?I called Mr. Russom today to check on his recovery after liver resection and also let him know what our plans are.  I spoke with him and his daughter.  The right lobe lesion was clearly visualized by intraoperative ultrasound during liver resection surgery and should be treatable by percutaneous image guided thermal ablation.  This has been scheduled for 10/08/2021 at [facility] ? ? ?Electronically Signed: ?Azzie Roup ?09/12/2021, 4:56 PM ? ? ? ?I spent a total of 10 Minutes in remote  clinical consultation, greater than 50% of which was counseling/coordinating care for metastatic rectal carcinoma to the liver.   ? ?Visit type: Audio only (telephone). Audio (no video) only due to patient's lack of internet/smartphone capability. ?Alternative for in-person consultation at [facility], [address] [place], [place], [place]. ?This visit type was conducted due to national recommendations for restrictions regarding the COVID-19 Pandemic (e.g. social distancing).  This format is felt to be most appropriate for this patient at this time.  All issues  noted in this document were discussed and addressed.    ? ?

## 2021-09-24 ENCOUNTER — Other Ambulatory Visit: Payer: Self-pay

## 2021-09-24 DIAGNOSIS — C787 Secondary malignant neoplasm of liver and intrahepatic bile duct: Secondary | ICD-10-CM

## 2021-09-26 ENCOUNTER — Other Ambulatory Visit: Payer: Self-pay

## 2021-09-26 ENCOUNTER — Inpatient Hospital Stay: Payer: Medicaid Other | Attending: Hematology | Admitting: Hematology

## 2021-09-26 ENCOUNTER — Inpatient Hospital Stay: Payer: Medicaid Other

## 2021-09-26 VITALS — BP 101/73 | HR 84 | Temp 97.9°F | Resp 20 | Wt 175.4 lb

## 2021-09-26 DIAGNOSIS — Z95828 Presence of other vascular implants and grafts: Secondary | ICD-10-CM

## 2021-09-26 DIAGNOSIS — C775 Secondary and unspecified malignant neoplasm of intrapelvic lymph nodes: Secondary | ICD-10-CM | POA: Insufficient documentation

## 2021-09-26 DIAGNOSIS — K610 Anal abscess: Secondary | ICD-10-CM | POA: Diagnosis not present

## 2021-09-26 DIAGNOSIS — C20 Malignant neoplasm of rectum: Secondary | ICD-10-CM

## 2021-09-26 DIAGNOSIS — C787 Secondary malignant neoplasm of liver and intrahepatic bile duct: Secondary | ICD-10-CM | POA: Diagnosis not present

## 2021-09-26 LAB — CMP (CANCER CENTER ONLY)
ALT: 13 U/L (ref 0–44)
AST: 12 U/L — ABNORMAL LOW (ref 15–41)
Albumin: 3.9 g/dL (ref 3.5–5.0)
Alkaline Phosphatase: 126 U/L (ref 38–126)
Anion gap: 6 (ref 5–15)
BUN: 9 mg/dL (ref 6–20)
CO2: 26 mmol/L (ref 22–32)
Calcium: 9.4 mg/dL (ref 8.9–10.3)
Chloride: 107 mmol/L (ref 98–111)
Creatinine: 0.74 mg/dL (ref 0.61–1.24)
GFR, Estimated: >60 mL/min (ref >60)
Glucose, Bld: 132 mg/dL — ABNORMAL HIGH (ref 70–99)
Potassium: 4 mmol/L (ref 3.5–5.1)
Sodium: 139 mmol/L (ref 135–145)
Total Bilirubin: 0.4 mg/dL (ref 0.3–1.2)
Total Protein: 7.8 g/dL (ref 6.5–8.1)

## 2021-09-26 LAB — CBC WITH DIFFERENTIAL (CANCER CENTER ONLY)
Abs Immature Granulocytes: 0.07 10*3/uL (ref 0.00–0.07)
Basophils Absolute: 0.1 10*3/uL (ref 0.0–0.1)
Basophils Relative: 1 %
Eosinophils Absolute: 0.5 10*3/uL (ref 0.0–0.5)
Eosinophils Relative: 3 %
HCT: 39.3 % (ref 39.0–52.0)
Hemoglobin: 12.9 g/dL — ABNORMAL LOW (ref 13.0–17.0)
Immature Granulocytes: 1 %
Lymphocytes Relative: 20 %
Lymphs Abs: 2.9 10*3/uL (ref 0.7–4.0)
MCH: 29.7 pg (ref 26.0–34.0)
MCHC: 32.8 g/dL (ref 30.0–36.0)
MCV: 90.6 fL (ref 80.0–100.0)
Monocytes Absolute: 1 10*3/uL (ref 0.1–1.0)
Monocytes Relative: 7 %
Neutro Abs: 9.7 10*3/uL — ABNORMAL HIGH (ref 1.7–7.7)
Neutrophils Relative %: 68 %
Platelet Count: 239 10*3/uL (ref 150–400)
RBC: 4.34 MIL/uL (ref 4.22–5.81)
RDW: 13.7 % (ref 11.5–15.5)
WBC Count: 14.3 10*3/uL — ABNORMAL HIGH (ref 4.0–10.5)
nRBC: 0 % (ref 0.0–0.2)

## 2021-09-26 LAB — CEA (IN HOUSE-CHCC): CEA (CHCC-In House): 2.73 ng/mL (ref 0.00–5.00)

## 2021-09-26 MED ORDER — HEPARIN SOD (PORK) LOCK FLUSH 100 UNIT/ML IV SOLN
500.0000 [IU] | Freq: Once | INTRAVENOUS | Status: AC
  Administered 2021-09-26: 500 [IU]

## 2021-09-26 MED ORDER — AMOXICILLIN-POT CLAVULANATE 875-125 MG PO TABS: 1.0000 | ORAL_TABLET | Freq: Two times a day (BID) | ORAL | 0 refills | Status: DC

## 2021-09-26 MED ORDER — SODIUM CHLORIDE 0.9% FLUSH
10.0000 mL | Freq: Once | INTRAVENOUS | Status: AC
  Administered 2021-09-26: 10 mL

## 2021-09-29 ENCOUNTER — Encounter: Payer: Self-pay | Admitting: Hematology

## 2021-09-29 NOTE — Progress Notes (Signed)
?[place]   ?[phone] [phone]   ?Clinic Follow up Note  ? ?Patient Care Team: ?[name], MD as PCP - General (Hematology) ?[name], MD as Consulting Physician (Urology) ?[name], MD as Consulting Physician (Gastroenterology) ?[name], MD as Consulting Physician (Colon and Rectal Surgery) ?[name], MD as Consulting Physician (Oncology) ?Earl Gala, Deliah Goody, RN as Sales executive (Oncology) ? ?Date of Service:  09/26/2021 ? ?CHIEF COMPLAINT:  ?Continued evaluation and management of oligometastatic rectal cancer ? 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 ?  ?01/13/2021 Cancer Staging  ? Staging form: Colon and Rectum, AJCC 8th Edition ?- Clinical stage from 01/13/2021: Stage IIIB (cT3, cN2a, cM0) - Signed by [name], MD on 01/19/2021 ?Stage prefix: Initial diagnosis ?Histologic grade (G): G2 ?Histologic grading system: 4 grade system ? ?  ?01/20/2021 - 03/26/2021 Chemotherapy  ? Patient is on Treatment Plan : RECTAL Xelox ([place]ox) q21d x 6 cycles  ? ?  ?  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 ?  ?05/22/2021 -  Chemotherapy  ? Patient is on Treatment Plan : PANCREAS Modified FOLFIRINOX q14d x 12 cycles  ? ?  ?  ? ?Previous treatment ?-4 cycles of XELOX (August to October 2022) ?-4 cycles of FOLFIRINOX (December 2022 through February 2023) ?-FOLFIRI x 2 cycles --pending surgery. ? ? ?INTERVAL HISTORY:  ? ?James Coffey is here for continued evaluation and management of his oligometastatic rectal cancer and after having had surgical resection of one of his dominant liver metastasis.  This lesion and pathology showed about 30% tissue necrosis but was positive for  rectal adenocarcinoma. ?He tolerated surgery well and has healed well and has been eating well. ?He is scheduled for CT-guided ablation of his second liver metastases by interventional radiology on 10/08/2021. ?We discussed referring him to radiation oncology to address the spot on the lung as well as consideration of radiation to the rectum prior to rectal surgery. ?He notes induration in the perianal area similar to previously.  It is still draining but there is some induration and we did send him a prescription for Augmentin and recommended warm compresses. ?Translation help was provided by his daughter Denita Lung who is in nursing school. ?Labs done today were reviewed in detail. ? ?MEDICAL HISTORY:  ?Past Medical History:  ?Diagnosis Date  ? Cancer [facility])   ? Tobacco abuse   ? ? ?SURGICAL HISTORY: ?Past Surgical History:  ?Procedure Laterality Date  ? HERNIA REPAIR    ? INCISION AND DRAINAGE ABSCESS N/A 12/01/2020  ? Procedure: INCISION AND DRAINAGE ABSCESS;  Surgeon: Michael Boston, MD;  [facility];  Service: General;  Laterality: N/A;  ? INCISION AND DRAINAGE ABSCESS N/A 12/04/2020  ? Procedure: INCISION AND DRAINAGE FOURNIERS GANGRENE;  Surgeon: [name], MD;  [facility];  Service: General;  Laterality: N/A;  ? INCISION AND DRAINAGE PERIRECTAL ABSCESS N/A 11/30/2020  ? Procedure: INCISION AND DEBRIDEMENT OF PERIRECTAL ABSCESS AND RECTAL WALL BIOPSIES;  Surgeon: Michael Boston, MD;  [facility];  Service: General;  Laterality: N/A;  ? INGUINAL HERNIA REPAIR Right 2007  ? IR IMAGING GUIDED PORT INSERTION  05/07/2021  ? IR RADIOLOGIST EVAL & MGMT  08/27/2021  ? IR RADIOLOGIST EVAL & MGMT  09/12/2021  ? LAPAROSCOPIC  LOOP COLOSTOMY N/A 12/13/2020  ? Procedure: DIAGNOSTIC LAPAROSCOPY WITH  OPEN DESCENDING LOOP COLOSTOMY;  Surgeon: Armandina Gemma, MD;  [facility];  Service: General;  Laterality: N/A;  ? LAPAROSCOPY N/A 08/28/2021  ? Procedure: LAPAROSCOPY DIAGNOSTIC;  Surgeon: Dwan Bolt, MD;   Location: [place];  Service: General;  Laterality: N/A;  ? OPEN PARTIAL HEPATECTOMY  Left 08/28/2021  ? Procedure: OPEN LEFT LATERAL HEPATECTOMY WITH INTRAOPERATIVE ULTRASOUND;  Surgeon: Dwan Bolt, MD;  Location: [place];  Service: General;  Laterality: Left;  ? RECTAL SURGERY    ? ? ?I have reviewed the social history and family history with the patient and they are unchanged from previous note. ? ?ALLERGIES:  is allergic to hydrocodone. ? ?ROS ?10 Point review of Systems was done is negative except as noted above. ? ? ?MEDICATIONS:  ?Current Outpatient Medications  ?Medication Sig Dispense Refill  ? amoxicillin-clavulanate (AUGMENTIN) 875-125 MG tablet Take 1 tablet by mouth 2 (two) times daily. 20 tablet 0  ? acetaminophen (TYLENOL) 500 MG tablet Take 2 tablets (1,000 mg total) by mouth every 8 (eight) hours as needed for mild pain or fever.    ? docusate sodium (COLACE) 100 MG capsule Take 1 capsule (100 mg total) by mouth 2 (two) times daily. 10 capsule 0  ? ibuprofen (ADVIL) 200 MG tablet Take 400 mg by mouth every 6 (six) hours as needed.    ? lidocaine-prilocaine (EMLA) cream Apply to affected area once 30 g 3  ? loperamide (IMODIUM) 2 MG capsule Take 2 tablets by mouth at onset of diarrhea, then 1 tab every 2hrs until 12hr without a BM. May take 2 tab every 4hrs at bedtime. If diarrhea recurs repeat. 30 capsule 1  ? LORazepam (ATIVAN) 0.5 MG tablet Take 1 tablet by mouth every 6 hours as needed for anxiety. 30 tablet 0  ? prochlorperazine (COMPAZINE) 10 MG tablet Take 1 tablet (10 mg total) by mouth every 6 (six) hours as needed for nausea or vomiting. 30 tablet 1  ? ?No current facility-administered medications for this visit.  ? ?Facility-Administered Medications Ordered in Other Visits  ?Medication Dose Route Frequency Provider Last Rate Last Admin  ? palonosetron (ALOXI) 0.25 MG/5ML injection           ? sodium chloride flush (NS) 0.9 % injection 10 mL  10 mL Intracatheter Once [name], MD       ? ? ?PHYSICAL EXAMINATION: ?ECOG PERFORMANCE STATUS: 1 - Symptomatic but completely ambulatory ? ?Vital signs reviewed ?Wt Readings from Last 3 Encounters:  ?09/26/21 175 lb 6.4 oz (79.6 kg)  ?08/28/21 178 lb (80.7 kg)  ?08/26/21 (P) 178 lb 3.2 oz (80.8 kg)  ?NAD ?GENERAL:alert, in no acute distress and comfortable ?SKIN: no acute rashes, no significant lesions ?EYES: conjunctiva are pink and non-injected, sclera anicteric ?OROPHARYNX: MMM, no exudates, no oropharyngeal erythema or ulceration ?NECK: supple, no JVD ?LYMPH:  no palpable lymphadenopathy in the cervical, axillary or inguinal regions ?LUNGS: clear to auscultation b/l with normal respiratory effort ?HEART: regular rate & rhythm ?ABDOMEN:  normoactive bowel sounds , non tender, not distended. ?Extremity: no pedal edema ?PSYCH: alert & oriented x 3 with fluent speech ?NEURO: no focal motor/sensory deficits ? ? ?LABORATORY DATA:  ?. ? ?  Latest Ref Rng & Units 09/26/2021  ?  9:13 AM 08/31/2021  ?  2:08 AM 08/30/2021  ?  1:18 AM  ?CBC  ?WBC 4.0 - 10.5 K/uL 14.3   11.6   11.1    ?  Hemoglobin 13.0 - 17.0 g/dL 12.9   11.6   11.5    ?Hematocrit 39.0 - 52.0 % 39.3   35.7   34.2    ?Platelets 150 - 400 K/uL 239   204   200    ? ?. ? ?  Latest Ref Rng & Units 09/26/2021  ?  9:13 AM 08/31/2021  ?  2:08 AM 08/30/2021  ?  1:18 AM  ?CMP  ?Glucose 70 - 99 mg/dL 132   113   128    ?BUN 6 - 20 mg/dL '9   12   12    '$ ?Creatinine 0.61 - 1.24 mg/dL 0.74   0.75   0.90    ?Sodium 135 - 145 mmol/L 139   141   141    ?Potassium 3.5 - 5.1 mmol/L 4.0   3.8   3.8    ?Chloride 98 - 111 mmol/L 107   110   110    ?CO2 22 - 32 mmol/L '26   23   24    '$ ?Calcium 8.9 - 10.3 mg/dL 9.4   8.8   8.8    ?Total Protein 6.5 - 8.1 g/dL 7.8   6.1   6.0    ?Total Bilirubin 0.3 - 1.2 mg/dL 0.4   0.8   0.6    ?Alkaline Phos 38 - 126 U/L 126   98   91    ?AST 15 - 41 U/L 12   38   98    ?ALT 0 - 44 U/L 13   127   183    ? ? ?Lab Results  ?Component Value Date  ? IRON 78 07/31/2021  ? TIBC 420 07/31/2021  ?  IRONPCTSAT 19 07/31/2021  ? ?(Iron and TIBC) ? ?Lab Results  ?Component Value Date  ? FERRITIN 142 07/31/2021  ? ? ? ?SURGICAL PATHOLOGY  ? THIS IS AN ADDENDUM REPORT   ?CASE: [id number]  ?PATIENT: NED

## 2021-09-30 ENCOUNTER — Other Ambulatory Visit: Payer: Self-pay | Admitting: Internal Medicine

## 2021-09-30 ENCOUNTER — Telehealth: Payer: Self-pay | Admitting: Radiation Oncology

## 2021-09-30 NOTE — Telephone Encounter (Signed)
Called patient to schedule a consultation w. Alison. No answer, LVM for a return call.  

## 2021-09-30 NOTE — Progress Notes (Addendum)
Anesthesia Review: ? ?PCP: DR Suzan Slick kale  LVO 09/26/21  ?Cardiologist : none  ?Oncology- Dr Irene Limbo  ?Chest x-ray : ?CT chest- 07/16/21  ?EKG : ?Echo : 12/06/20  ?Stress test: ?Cardiac Cath :  ?Activity level: can do a flight of stairs without difficulty  ?Sleep Study/ CPAP : none  ?Fasting Blood Sugar :      / Checks Blood Sugar -- times a day:   ?Blood Thinner/ Instructions /Last Dose: ?ASA / Instructions/ Last Dose :   ?09/26/21 cbc and cmp  ?PT has rectal cancer with mets to liver  ?Speaks [place], [place] or [place] per daughter.   ?PT came alone to preop dept for preop testing.  PT speaks [place] and understands [place].  PT asked appropriate questions at preop appt andvoices understanding.   ?08/28/21- DrAllen- hepatopectomy  ?PT has PORT.  ?CBC done 10/03/26- routed to [facility].  ?White count- 13.5- PEr ov note of 09/26/21 pt placed on Augmentin for perianal area.    ?

## 2021-09-30 NOTE — Progress Notes (Signed)
DUE TO COVID-19 ONLY  2 VISITOR IS ALLOWED TO COME WITH YOU AND STAY IN THE WAITING ROOM ONLY DURING PRE OP AND PROCEDURE DAY OF SURGERY.   4 VISITOR  MAY VISIT WITH YOU AFTER SURGERY IN YOUR PRIVATE ROOM DURING VISITING HOURS ONLY! ?YOU MAY HAVE ONE PERSON SPEND THE NITE WITH YOU IN YOUR ROOM AFTER SURGERY.   ? ? Your procedure is scheduled on:  ?  10/08/2021  ? Report to [facility] Main  Entrance ? ? Report to admitting at         0630AM          AM ?DO NOT BRING INSURANCE CARD, PICTURE ID OR WALLET DAY OF SURGERY.  ?  ? ? Call this number if you have problems the morning of surgery [phone]  ? ? REMEMBER: NO  SOLID FOODS , CANDY, GUM OR MINTS AFTER MIDNITE THE NITE BEFORE SURGERY .       [name] CLEAR LIQUIDS UNTIL      0545AM           DAY OF SURGERY.     .   ? ? ? ? ?CLEAR LIQUID DIET ? ? ?Foods Allowed      ?WATER ?BLACK COFFEE ( SUGAR OK, NO MILK, CREAM OR CREAMER) REGULAR AND DECAF  ?TEA ( SUGAR OK NO MILK, CREAM, OR CREAMER) REGULAR AND DECAF  ?PLAIN JELLO ( NO RED)  ?FRUIT ICES ( NO RED, NO FRUIT PULP)  ?POPSICLES ( NO RED)  ?JUICE- APPLE, WHITE GRAPE AND WHITE CRANBERRY  ?SPORT DRINK LIKE GATORADE ( NO RED)  ?CLEAR BROTH ( VEGETABLE , CHICKEN OR BEEF)                                                               ? ?    ? ?BRUSH YOUR TEETH MORNING OF SURGERY AND RINSE YOUR MOUTH OUT, NO CHEWING GUM CANDY OR MINTS. ?  ? ? Take these medicines the morning of surgery with A SIP OF WATER:  NONE  ? ? ?DO NOT TAKE ANY DIABETIC MEDICATIONS DAY OF YOUR SURGERY ?                  ?            You may not have any metal on your body including hair pins and  ?            piercings  Do not wear jewelry, make-up, lotions, powders or perfumes, deodorant ?            Do not wear nail polish on your fingernails.   ?           IF YOU ARE A MALE AND WANT TO SHAVE UNDER ARMS OR LEGS PRIOR TO SURGERY YOU MUST DO SO AT LEAST 48 HOURS PRIOR TO SURGERY.  ?            Men may shave face and neck. ? ? Do not bring  valuables to the hospital. [facility] NOT ?            RESPONSIBLE   FOR VALUABLES. ? Contacts, dentures or bridgework may not be worn into surgery. ? Leave suitcase in the car. After surgery it may be brought  to your room. ? ?  ? Patients discharged the day of surgery will not be allowed to drive home. IF YOU ARE HAVING SURGERY AND GOING HOME THE SAME DAY, YOU MUST HAVE AN ADULT TO DRIVE YOU HOME AND BE WITH YOU FOR 24 HOURS. YOU MAY GO HOME BY TAXI OR UBER OR ORTHERWISE, BUT AN ADULT MUST ACCOMPANY YOU HOME AND STAY WITH YOU FOR 24 HOURS. ?  ? ?            Please read over the following fact sheets you were given: ?_____________________________________________________________________ ? ? - Preparing for Surgery ?Before surgery, you can play an important role.  Because skin is not sterile, your skin needs to be as free of germs as possible.  You can reduce the number of germs on your skin by washing with CHG (chlorahexidine gluconate) soap before surgery.  CHG is an antiseptic cleaner which kills germs and bonds with the skin to continue killing germs even after washing. ?Please DO NOT use if you have an allergy to CHG or antibacterial soaps.  If your skin becomes reddened/irritated stop using the CHG and inform your nurse when you arrive at Short Stay. ?Do not shave (including legs and underarms) for at least 48 hours prior to the first CHG shower.  You may shave your face/neck. ?Please follow these instructions carefully: ? 1.  Shower with CHG Soap the night before surgery and the  morning of Surgery. ? 2.  If you choose to wash your hair, wash your hair first as usual with your  normal  shampoo. ? 3.  After you shampoo, rinse your hair and body thoroughly to remove the  shampoo.                           4.  Use CHG as you would any other liquid soap.  You can apply chg directly  to the skin and wash  ?                     Gently with a scrungie or clean washcloth. ? 5.  Apply the CHG Soap to your  body ONLY FROM THE NECK DOWN.   Do not use on face/ open      ?                     Wound or open sores. Avoid contact with eyes, ears mouth and genitals (private parts).  ?                     Production manager,  Genitals (private parts) with your normal soap. ?            6.  Wash thoroughly, paying special attention to the area where your surgery  will be performed. ? 7.  Thoroughly rinse your body with warm water from the neck down. ? 8.  DO NOT shower/wash with your normal soap after using and rinsing off  the CHG Soap. ?               9.  Pat yourself dry with a clean towel. ?           10.  Wear clean pajamas. ?           11.  Place clean sheets on your bed the night of your first shower and do not  sleep with pets. ?Day of Surgery : ?  Do not apply any lotions/deodorants the morning of surgery.  Please wear clean clothes to the hospital/surgery center. ? ?FAILURE TO FOLLOW THESE INSTRUCTIONS MAY RESULT IN THE CANCELLATION OF YOUR SURGERY ?PATIENT SIGNATURE_________________________________ ? ?NURSE SIGNATURE__________________________________ ? ?________________________________________________________________________  ? ? ?           ?

## 2021-10-01 ENCOUNTER — Telehealth: Payer: Self-pay | Admitting: Radiation Oncology

## 2021-10-01 NOTE — Telephone Encounter (Signed)
Called patient to schedule a follow up appointment w. Alison. No answer, LVM for a return call. 

## 2021-10-02 ENCOUNTER — Telehealth: Payer: Self-pay | Admitting: Radiation Oncology

## 2021-10-02 ENCOUNTER — Other Ambulatory Visit: Payer: Self-pay

## 2021-10-02 ENCOUNTER — Encounter (HOSPITAL_COMMUNITY)
Admission: RE | Admit: 2021-10-02 | Discharge: 2021-10-02 | Disposition: A | Discharge status: Home / Self Care | Payer: Medicaid Other | Source: Ambulatory Visit | Attending: Interventional Radiology | Admitting: Interventional Radiology

## 2021-10-02 ENCOUNTER — Encounter (HOSPITAL_COMMUNITY): Payer: Self-pay

## 2021-10-02 DIAGNOSIS — Z01818 Encounter for other preprocedural examination: Secondary | ICD-10-CM

## 2021-10-02 DIAGNOSIS — Z01812 Encounter for preprocedural laboratory examination: Secondary | ICD-10-CM | POA: Insufficient documentation

## 2021-10-02 LAB — CBC WITH DIFFERENTIAL/PLATELET
Abs Immature Granulocytes: 0.09 10*3/uL — ABNORMAL HIGH (ref 0.00–0.07)
Basophils Absolute: 0.1 10*3/uL (ref 0.0–0.1)
Basophils Relative: 1 %
Eosinophils Absolute: 0.5 10*3/uL (ref 0.0–0.5)
Eosinophils Relative: 4 %
HCT: 41.8 % (ref 39.0–52.0)
Hemoglobin: 13.9 g/dL (ref 13.0–17.0)
Immature Granulocytes: 1 %
Lymphocytes Relative: 28 %
Lymphs Abs: 3.8 10*3/uL (ref 0.7–4.0)
MCH: 30.4 pg (ref 26.0–34.0)
MCHC: 33.3 g/dL (ref 30.0–36.0)
MCV: 91.5 fL (ref 80.0–100.0)
Monocytes Absolute: 0.9 10*3/uL (ref 0.1–1.0)
Monocytes Relative: 7 %
Neutro Abs: 8.1 10*3/uL — ABNORMAL HIGH (ref 1.7–7.7)
Neutrophils Relative %: 59 %
Platelets: 261 10*3/uL (ref 150–400)
RBC: 4.57 MIL/uL (ref 4.22–5.81)
RDW: 13.9 % (ref 11.5–15.5)
WBC: 13.5 10*3/uL — ABNORMAL HIGH (ref 4.0–10.5)
nRBC: 0 % (ref 0.0–0.2)

## 2021-10-02 LAB — COMPREHENSIVE METABOLIC PANEL
ALT: 17 U/L (ref 0–44)
AST: 16 U/L (ref 15–41)
Albumin: 3.8 g/dL (ref 3.5–5.0)
Alkaline Phosphatase: 107 U/L (ref 38–126)
Anion gap: 6 (ref 5–15)
BUN: 15 mg/dL (ref 6–20)
CO2: 21 mmol/L — ABNORMAL LOW (ref 22–32)
Calcium: 9.4 mg/dL (ref 8.9–10.3)
Chloride: 110 mmol/L (ref 98–111)
Creatinine, Ser: 0.75 mg/dL (ref 0.61–1.24)
GFR, Estimated: >60 mL/min (ref >60)
Glucose, Bld: 130 mg/dL — ABNORMAL HIGH (ref 70–99)
Potassium: 4 mmol/L (ref 3.5–5.1)
Sodium: 137 mmol/L (ref 135–145)
Total Bilirubin: 0.8 mg/dL (ref 0.3–1.2)
Total Protein: 7.7 g/dL (ref 6.5–8.1)

## 2021-10-02 LAB — PROTIME-INR
INR: 1 (ref 0.8–1.2)
Prothrombin Time: 13.3 seconds (ref 11.4–15.2)

## 2021-10-02 NOTE — Telephone Encounter (Signed)
Called patient's daughter to schedule a follow up appointment with Bryson Ha. No answer, LVM for a return call.  ?

## 2021-10-02 NOTE — Telephone Encounter (Signed)
Sent unable to contact letter 4/27.  ?

## 2021-10-07 ENCOUNTER — Other Ambulatory Visit: Payer: Self-pay | Admitting: Radiology

## 2021-10-07 NOTE — Anesthesia Preprocedure Evaluation (Addendum)
Anesthesia Evaluation  ?Patient identified by MRN, date of birth, ID band ?Patient awake ? ? ? ?Reviewed: ?Allergy & Precautions, NPO status , Patient's Chart, lab work & pertinent test results ? ?Airway ?Mallampati: II ? ?TM Distance: >3 FB ? ? ? ? Dental ?  ?Pulmonary ?Current Smoker and Patient abstained from smoking.,  ?  ?breath sounds clear to auscultation ? ? ? ? ? ? Cardiovascular ?negative cardio ROS ? ? ?Rhythm:Regular Rate:Normal ? ? ?  ?Neuro/Psych ?  ? GI/Hepatic ?negative GI ROS, Neg liver ROS,   ?Endo/Other  ?negative endocrine ROS ? Renal/GU ?negative Renal ROS  ? ?  ?Musculoskeletal ? ? Abdominal ?  ?Peds ? Hematology ?  ?Anesthesia Other Findings ? ? Reproductive/Obstetrics ? ?  ? ? ? ? ? ? ? ? ? ? ? ? ? ?  ?  ? ? ? ? ? ? ? ?Anesthesia Physical ?Anesthesia Plan ? ?ASA: 2 ? ?Anesthesia Plan: General  ? ?Post-op Pain Management:   ? ?Induction: Intravenous ? ?PONV Risk Score and Plan: 2 and Treatment may vary due to age or medical condition, Ondansetron and Midazolam ? ?Airway Management Planned: Oral ETT ? ?Additional Equipment:  ? ?Intra-op Plan:  ? ?Post-operative Plan: Extubation in OR ? ?Informed Consent: I have reviewed the patients History and Physical, chart, labs and discussed the procedure including the risks, benefits and alternatives for the proposed anesthesia with the patient or authorized representative who has indicated his/her understanding and acceptance.  ? ? ? ?Dental advisory given ? ?Plan Discussed with: CRNA and Anesthesiologist ? ?Anesthesia Plan Comments:   ? ? ? ? ? ? ?Anesthesia Quick Evaluation ? ?

## 2021-10-08 ENCOUNTER — Encounter (HOSPITAL_COMMUNITY)
Admission: RE | Disposition: A | Discharge status: Home / Self Care | Payer: Self-pay | Source: Ambulatory Visit | Attending: Interventional Radiology

## 2021-10-08 ENCOUNTER — Observation Stay (HOSPITAL_COMMUNITY)
Admission: RE | Admit: 2021-10-08 | Discharge: 2021-10-08 | Disposition: A | Discharge status: Home / Self Care | Payer: Medicaid Other | Source: Ambulatory Visit | Attending: Interventional Radiology | Admitting: Interventional Radiology

## 2021-10-08 ENCOUNTER — Ambulatory Visit (HOSPITAL_COMMUNITY): Payer: Medicaid Other | Admitting: Physician Assistant

## 2021-10-08 ENCOUNTER — Ambulatory Visit (HOSPITAL_BASED_OUTPATIENT_CLINIC_OR_DEPARTMENT_OTHER): Payer: Medicaid Other | Admitting: Anesthesiology

## 2021-10-08 ENCOUNTER — Other Ambulatory Visit (HOSPITAL_COMMUNITY): Payer: Self-pay

## 2021-10-08 DIAGNOSIS — C799 Secondary malignant neoplasm of unspecified site: Secondary | ICD-10-CM | POA: Diagnosis not present

## 2021-10-08 DIAGNOSIS — C19 Malignant neoplasm of rectosigmoid junction: Secondary | ICD-10-CM

## 2021-10-08 DIAGNOSIS — Z01818 Encounter for other preprocedural examination: Secondary | ICD-10-CM

## 2021-10-08 DIAGNOSIS — C787 Secondary malignant neoplasm of liver and intrahepatic bile duct: Principal | ICD-10-CM | POA: Insufficient documentation

## 2021-10-08 DIAGNOSIS — C229 Malignant neoplasm of liver, not specified as primary or secondary: Secondary | ICD-10-CM | POA: Diagnosis not present

## 2021-10-08 HISTORY — PX: RADIOLOGY WITH ANESTHESIA: SHX6223

## 2021-10-08 LAB — CBC WITH DIFFERENTIAL/PLATELET
Abs Immature Granulocytes: 0.06 10*3/uL (ref 0.00–0.07)
Basophils Absolute: 0.1 10*3/uL (ref 0.0–0.1)
Basophils Relative: 1 %
Eosinophils Absolute: 0.4 10*3/uL (ref 0.0–0.5)
Eosinophils Relative: 3 %
HCT: 42.8 % (ref 39.0–52.0)
Hemoglobin: 14.1 g/dL (ref 13.0–17.0)
Immature Granulocytes: 0 %
Lymphocytes Relative: 21 %
Lymphs Abs: 3 10*3/uL (ref 0.7–4.0)
MCH: 29.7 pg (ref 26.0–34.0)
MCHC: 32.9 g/dL (ref 30.0–36.0)
MCV: 90.3 fL (ref 80.0–100.0)
Monocytes Absolute: 1.2 10*3/uL — ABNORMAL HIGH (ref 0.1–1.0)
Monocytes Relative: 8 %
Neutro Abs: 9.5 10*3/uL — ABNORMAL HIGH (ref 1.7–7.7)
Neutrophils Relative %: 67 %
Platelets: 245 10*3/uL (ref 150–400)
RBC: 4.74 MIL/uL (ref 4.22–5.81)
RDW: 14.2 % (ref 11.5–15.5)
WBC: 14.2 10*3/uL — ABNORMAL HIGH (ref 4.0–10.5)
nRBC: 0 % (ref 0.0–0.2)

## 2021-10-08 LAB — TYPE AND SCREEN
ABO/RH(D): A NEG
Antibody Screen: NEGATIVE

## 2021-10-08 SURGERY — IR WITH ANESTHESIA
Anesthesia: General

## 2021-10-08 MED ORDER — PROPOFOL 10 MG/ML IV BOLUS
INTRAVENOUS | Status: DC | PRN
  Administered 2021-10-08: 140 mg via INTRAVENOUS

## 2021-10-08 MED ORDER — ORAL CARE MOUTH RINSE: 15.0000 mL | Freq: Once | OROMUCOSAL | Status: AC

## 2021-10-08 MED ORDER — FENTANYL CITRATE PF 50 MCG/ML IJ SOSY
PREFILLED_SYRINGE | INTRAMUSCULAR | Status: AC
  Filled 2021-10-08: qty 1

## 2021-10-08 MED ORDER — ALBUMIN HUMAN 5 % IV SOLN
INTRAVENOUS | Status: AC
  Filled 2021-10-08: qty 250

## 2021-10-08 MED ORDER — CHLORHEXIDINE GLUCONATE 0.12 % MT SOLN
15.0000 mL | Freq: Once | OROMUCOSAL | Status: AC
  Administered 2021-10-08: 15 mL via OROMUCOSAL

## 2021-10-08 MED ORDER — LIDOCAINE HCL (CARDIAC) PF 100 MG/5ML IV SOSY
PREFILLED_SYRINGE | INTRAVENOUS | Status: DC | PRN
  Administered 2021-10-08: 60 mg via INTRAVENOUS

## 2021-10-08 MED ORDER — FENTANYL CITRATE PF 50 MCG/ML IJ SOSY
PREFILLED_SYRINGE | INTRAMUSCULAR | Status: AC
  Administered 2021-10-08: 50 ug via INTRAVENOUS
  Filled 2021-10-08: qty 1

## 2021-10-08 MED ORDER — DEXAMETHASONE SODIUM PHOSPHATE 10 MG/ML IJ SOLN
INTRAMUSCULAR | Status: DC | PRN
  Administered 2021-10-08: 10 mg via INTRAVENOUS

## 2021-10-08 MED ORDER — GLYCOPYRROLATE 0.2 MG/ML IJ SOLN
INTRAMUSCULAR | Status: DC | PRN
  Administered 2021-10-08: .2 mg via INTRAVENOUS

## 2021-10-08 MED ORDER — ROCURONIUM BROMIDE 100 MG/10ML IV SOLN
INTRAVENOUS | Status: DC | PRN
  Administered 2021-10-08: 30 mg via INTRAVENOUS
  Administered 2021-10-08: 50 mg via INTRAVENOUS
  Administered 2021-10-08: 20 mg via INTRAVENOUS

## 2021-10-08 MED ORDER — DEXMEDETOMIDINE (PRECEDEX) IN NS 20 MCG/5ML (4 MCG/ML) IV SYRINGE
PREFILLED_SYRINGE | INTRAVENOUS | Status: AC
  Filled 2021-10-08: qty 5

## 2021-10-08 MED ORDER — OXYCODONE HCL 5 MG PO TABS
5.0000 mg | ORAL_TABLET | Freq: Four times a day (QID) | ORAL | 0 refills | Status: DC | PRN
  Filled 2021-10-08: qty 20, 5d supply, fill #0

## 2021-10-08 MED ORDER — HYDROMORPHONE HCL 1 MG/ML IJ SOLN
0.2500 mg | INTRAMUSCULAR | Status: DC | PRN
  Administered 2021-10-08: 0.25 mg via INTRAVENOUS

## 2021-10-08 MED ORDER — PHENYLEPHRINE HCL (PRESSORS) 10 MG/ML IV SOLN
INTRAVENOUS | Status: DC | PRN
  Administered 2021-10-08: 160 ug via INTRAVENOUS
  Administered 2021-10-08 (×11): 80 ug via INTRAVENOUS

## 2021-10-08 MED ORDER — MIDAZOLAM HCL 2 MG/2ML IJ SOLN
INTRAMUSCULAR | Status: AC
  Filled 2021-10-08: qty 2

## 2021-10-08 MED ORDER — MIDAZOLAM HCL 5 MG/5ML IJ SOLN
INTRAMUSCULAR | Status: DC | PRN
Start: 2021-10-08 — End: 2021-10-08
  Administered 2021-10-08: 2 mg via INTRAVENOUS

## 2021-10-08 MED ORDER — LIP MEDEX EX OINT
TOPICAL_OINTMENT | CUTANEOUS | Status: AC
  Filled 2021-10-08: qty 7

## 2021-10-08 MED ORDER — OXYCODONE HCL 5 MG PO TABS: 5.0000 mg | ORAL_TABLET | Freq: Four times a day (QID) | ORAL | Status: DC | PRN

## 2021-10-08 MED ORDER — FENTANYL CITRATE PF 50 MCG/ML IJ SOSY
25.0000 ug | PREFILLED_SYRINGE | INTRAMUSCULAR | Status: DC | PRN
  Administered 2021-10-08: 50 ug via INTRAVENOUS

## 2021-10-08 MED ORDER — PHENYLEPHRINE HCL-NACL 20-0.9 MG/250ML-% IV SOLN
INTRAVENOUS | Status: DC | PRN
  Administered 2021-10-08: 40 ug/min via INTRAVENOUS

## 2021-10-08 MED ORDER — ALBUMIN HUMAN 5 % IV SOLN: INTRAVENOUS | Status: DC | PRN

## 2021-10-08 MED ORDER — FENTANYL CITRATE (PF) 100 MCG/2ML IJ SOLN
INTRAMUSCULAR | Status: DC | PRN
  Administered 2021-10-08 (×2): 100 ug via INTRAVENOUS
  Administered 2021-10-08: 50 ug via INTRAVENOUS

## 2021-10-08 MED ORDER — LACTATED RINGERS IV SOLN: INTRAVENOUS | Status: DC

## 2021-10-08 MED ORDER — FENTANYL CITRATE (PF) 250 MCG/5ML IJ SOLN
INTRAMUSCULAR | Status: AC
  Filled 2021-10-08: qty 5

## 2021-10-08 MED ORDER — ONDANSETRON HCL 4 MG/2ML IJ SOLN
INTRAMUSCULAR | Status: DC | PRN
  Administered 2021-10-08: 4 mg via INTRAVENOUS

## 2021-10-08 MED ORDER — SUGAMMADEX SODIUM 500 MG/5ML IV SOLN
INTRAVENOUS | Status: DC | PRN
Start: 2021-10-08 — End: 2021-10-08
  Administered 2021-10-08: 200 mg via INTRAVENOUS

## 2021-10-08 MED ORDER — KETAMINE HCL 10 MG/ML IJ SOLN
INTRAMUSCULAR | Status: AC
  Filled 2021-10-08: qty 1

## 2021-10-08 MED ORDER — SODIUM CHLORIDE 0.9 % IV SOLN: INTRAVENOUS | Status: DC

## 2021-10-08 MED ORDER — HYDROMORPHONE HCL 1 MG/ML IJ SOLN
INTRAMUSCULAR | Status: AC
  Administered 2021-10-08: 0.25 mg via INTRAVENOUS
  Filled 2021-10-08: qty 1

## 2021-10-08 MED ORDER — SODIUM CHLORIDE 0.9 % IV SOLN
2.0000 g | INTRAVENOUS | Status: AC
  Administered 2021-10-08: 2 g via INTRAVENOUS
  Filled 2021-10-08: qty 2

## 2021-10-08 NOTE — Anesthesia Procedure Notes (Signed)
Procedure Name: Intubation ?Date/Time: 10/08/2021 8:56 AM ?Performed by: Jonna Munro, CRNA ?Pre-anesthesia Checklist: Patient identified, Emergency Drugs available, Suction available, Patient being monitored and Timeout performed ?Patient Re-evaluated:Patient Re-evaluated prior to induction ?Oxygen Delivery Method: Circle system utilized ?Preoxygenation: Pre-oxygenation with 100% oxygen ?Induction Type: IV induction ?Ventilation: Mask ventilation without difficulty ?Laryngoscope Size: Mac and 3 ?Grade View: Grade I ?Tube type: Oral ?Tube size: 7.5 mm ?Number of attempts: 1 ?Airway Equipment and Method: Stylet ?Placement Confirmation: ETT inserted through vocal cords under direct vision, positive ETCO2 and breath sounds checked- equal and bilateral ?Secured at: 22 cm ?Tube secured with: Tape ?Dental Injury: Teeth and Oropharynx as per pre-operative assessment  ? ? ? ? ?

## 2021-10-08 NOTE — Anesthesia Postprocedure Evaluation (Signed)
Anesthesia Post Note ? ?Patient: James Coffey ? ?Procedure(s) Performed: MICROWAVE ABLATION ? ?  ? ?Patient location during evaluation: PACU ?Anesthesia Type: General ?Level of consciousness: awake ?Pain management: pain level controlled ?Vital Signs Assessment: post-procedure vital signs reviewed and stable ?Respiratory status: spontaneous breathing ?Cardiovascular status: stable ?Postop Assessment: no apparent nausea or vomiting ?Anesthetic complications: no ? ? ?No notable events documented. ? ?Last Vitals:  ?Vitals:  ? 10/08/21 1330 10/08/21 1345  ?BP: 107/79   ?Pulse: 71 69  ?Resp: 13 15  ?Temp:    ?SpO2: 96% 97%  ?  ?Last Pain:  ?Vitals:  ? 10/08/21 1345  ?TempSrc:   ?PainSc: 0-No pain  ? ? ?  ?  ?  ?  ?  ?  ? ?Edra Riccardi ? ? ? ? ?

## 2021-10-08 NOTE — H&P (Addendum)
? ? ?Referring Physician(s): ?Allen,S ? ?Supervising Physician: Aletta Edouard ? ?Patient Status:  [facility] OP TBA ? 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 ? ?Subjective: ?Patient known to IR service from Port-A-Cath placement in 2022 and consultations with Dr. Kathlene Cote on 08/27/2021 and 09/12/2021 to discuss treatment options for metastatic rectal cancer to liver.  He is status post diverting descending colostomy in July 2022 and segment 3 liver lesion resection with positive pathology on 08/28/2021.  Following discussions with Dr. Kathlene Cote he now presents for CT-guided microwave ablation of a 1.4 cm right hepatic lobe lesion in segment 5 .  He currently denies fever, headache, chest pain, dyspnea, cough, abdominal/back pain, nausea, vomiting or bleeding. ? ?Past Medical History:  ?Diagnosis Date  ? Cancer [facility])   ? rectal cancer with mets to liver  ? Tobacco abuse   ? ?Past Surgical History:  ?Procedure Laterality Date  ? HERNIA REPAIR    ? INCISION AND DRAINAGE ABSCESS N/A 12/01/2020  ? Procedure: INCISION AND DRAINAGE ABSCESS;  Surgeon: Michael Boston, MD;  Location: [facility] ORS;  Service: General;  Laterality: N/A;  ? INCISION AND DRAINAGE ABSCESS N/A 12/04/2020  ? Procedure: INCISION AND DRAINAGE FOURNIERS GANGRENE;  Surgeon: [name], MD;  Location: [facility];  Service: General;  Laterality: N/A;  ? INCISION AND DRAINAGE PERIRECTAL ABSCESS N/A 11/30/2020  ? Procedure: INCISION AND DEBRIDEMENT OF PERIRECTAL ABSCESS AND RECTAL WALL BIOPSIES;  Surgeon: Michael Boston, MD;  Location: [facility];  Service: General;  Laterality: N/A;  ? INGUINAL HERNIA REPAIR Right 2007  ? IR IMAGING GUIDED PORT INSERTION  05/07/2021  ? IR RADIOLOGIST EVAL & MGMT  08/27/2021  ? IR RADIOLOGIST EVAL & MGMT  09/12/2021  ? LAPAROSCOPIC LOOP COLOSTOMY N/A 12/13/2020  ? Procedure: DIAGNOSTIC LAPAROSCOPY WITH  OPEN DESCENDING LOOP COLOSTOMY;  Surgeon: Armandina Gemma, MD;  Location: [facility];  Service: General;  Laterality: N/A;  ? LAPAROSCOPY  N/A 08/28/2021  ? Procedure: LAPAROSCOPY DIAGNOSTIC;  Surgeon: Dwan Bolt, MD;  Location: [place];  Service: General;  Laterality: N/A;  ? OPEN PARTIAL HEPATECTOMY  Left 08/28/2021  ? Procedure: OPEN LEFT LATERAL HEPATECTOMY WITH INTRAOPERATIVE ULTRASOUND;  Surgeon: Dwan Bolt, MD;  Location: [place];  Service: General;  Laterality: Left;  ? RECTAL SURGERY    ? ? ? ? ?Allergies: ?Hydrocodone ? ?Medications: ?Prior to Admission medications   ?Medication Sig Start Date End Date Taking? Authorizing Provider  ?acetaminophen (TYLENOL) 500 MG tablet Take 2 tablets (1,000 mg total) by mouth every 8 (eight) hours as needed for mild pain or fever. 12/19/20  Yes Norm Parcel, PA-C  ?amoxicillin-clavulanate (AUGMENTIN) 875-125 MG tablet Take 1 tablet by mouth 2 (two) times daily. 09/26/21  Yes [name], MD  ?docusate sodium (COLACE) 100 MG capsule Take 1 capsule (100 mg total) by mouth 2 (two) times daily. 08/31/21  Yes Dwan Bolt, MD  ?ibuprofen (ADVIL) 200 MG tablet Take 400 mg by mouth every 6 (six) hours as needed for moderate pain.   Yes [provider]  ?lidocaine-prilocaine (EMLA) cream Apply to affected area once ?Patient not taking: Reported on 09/29/2021 05/07/21   [name], MD  ?loperamide (IMODIUM) 2 MG capsule Take 2 tablets by mouth at onset of diarrhea, then 1 tab every 2hrs until 12hr without a BM. May take 2 tab every 4hrs at bedtime. If diarrhea recurs repeat. ?Patient not taking: Reported on 09/29/2021 05/07/21   [name], MD  ?LORazepam (ATIVAN) 0.5 MG tablet  Take 1 tablet by mouth every 6 hours as needed for anxiety. ?Patient taking differently: Take 0.5 mg by mouth at bedtime as needed for anxiety or sleep. 07/17/21   [name], MD  ?prochlorperazine (COMPAZINE) 10 MG tablet Take 1 tablet (10 mg total) by mouth every 6 (six) hours as needed for nausea or vomiting. ?Patient not taking: Reported on 09/29/2021 07/17/21   [name], MD   ? ? ? ?Vital Signs: ?BP 113/77 (BP Location: Right Arm)   Pulse 70   Temp 98.3 ?F (36.8 ?C) (Oral)   Resp 15   SpO2 95%  ? ?Physical Exam awake, alert.  Chest clear to auscultation bilaterally.  Clean, intact right chest wall Port-A-Cath.  Heart with regular rate and rhythm.  Abdomen soft, positive bowel sounds, intact left-sided colostomy.  No lower extremity edema. ? ?Imaging: ?No results found. ? ?Labs: ? ?CBC: ?Recent Labs  ?  08/30/21 ?0118 08/31/21 ?0208 09/26/21 ?4268 10/02/21 ?3419  ?WBC 11.1* 11.6* 14.3* 13.5*  ?HGB 11.5* 11.6* 12.9* 13.9  ?HCT 34.2* 35.7* 39.3 41.8  ?PLT 200 204 239 261  ? ? ?COAGS: ?Recent Labs  ?  10/02/21 ?0907  ?INR 1.0  ? ? ?BMP: ?Recent Labs  ?  08/30/21 ?0118 08/31/21 ?0208 09/26/21 ?6222 10/02/21 ?9798  ?NA 141 141 139 137  ?K 3.8 3.8 4.0 4.0  ?CL 110 110 107 110  ?CO2 '24 23 26 '$ 21*  ?GLUCOSE 128* 113* 132* 130*  ?BUN '12 12 9 15  '$ ?CALCIUM 8.8* 8.8* 9.4 9.4  ?CREATININE 0.90 0.75 0.74 0.75  ?GFRNONAA >60 >60 >60 >60  ? ? ?LIVER FUNCTION TESTS: ?Recent Labs  ?  08/30/21 ?0118 08/31/21 ?0208 09/26/21 ?9211 10/02/21 ?9417  ?BILITOT 0.6 0.8 0.4 0.8  ?AST 98* 38 12* 16  ?ALT 183* 127* 13 17  ?ALKPHOS 91 98 126 107  ?PROT 6.0* 6.1* 7.8 7.7  ?ALBUMIN 3.1* 2.9* 3.9 3.8  ? ? ?Assessment and Plan: ?Patient known to IR service from Port-A-Cath placement in 2022 and consultations with Dr. Kathlene Cote on 08/27/2021 and 09/12/2021 to discuss treatment options for metastatic rectal cancer to liver.  He is status post diverting descending colostomy in July 2022 and segment 3 liver lesion resection with positive pathology on 08/28/2021.  Following discussions with Dr. Kathlene Cote he now presents for CT-guided microwave ablation of a 1.4 cm right hepatic lobe lesion in segment 5 .  Details/risks of procedure, including but not limited to, internal bleeding, infection, injury to adjacent structures, anesthesia related complications discussed with patient via interpreter with his understanding and  consent. ? ? ?Electronically Signed: ?Autumn Messing, PA-C ?10/08/2021, 7:47 AM ? ? ?I spent a total of 30 minutes at the the patient's bedside AND on the patient's hospital floor or unit, greater than 50% of which was counseling/coordinating care for image guided microwave ablation of right hepatic lobe lesion ? ? ? ? ? ?

## 2021-10-08 NOTE — Transfer of Care (Signed)
Immediate Anesthesia Transfer of Care Note ? ?Patient: James Coffey ? ?Procedure(s) Performed: MICROWAVE ABLATION ? ?Patient Location: PACU ? ?Anesthesia Type:General ? ?Level of Consciousness: awake, alert , oriented and patient cooperative ? ?Airway & Oxygen Therapy: Patient Spontanous Breathing and Patient connected to face mask oxygen ? ?Post-op Assessment: Report given to RN, Post -op Vital signs reviewed and stable and Patient moving all extremities X 4 ? ?Post vital signs: Reviewed and stable ? ?Last Vitals:  ?Vitals Value Taken Time  ?BP 98/71 10/08/21 1130  ?Temp    ?Pulse 69 10/08/21 1139  ?Resp 19 10/08/21 1139  ?SpO2 95 % 10/08/21 1139  ?Vitals shown include unvalidated device data. ? ?Last Pain:  ?Vitals:  ? 10/08/21 0741  ?TempSrc:   ?PainSc: 0-No pain  ?   ? ?  ? ?Complications: No notable events documented. ?

## 2021-10-08 NOTE — Procedures (Signed)
Interventional Radiology Procedure Note ? ?Procedure: Image guided microwave thermal ablation of liver ? ?Anesthesia: General ? ?Complications: None ? ?Estimated Blood Loss: < 10 mL ? ?Findings: ?Hypoechoic right lobe liver metastasis measures approximately 2 cm in greatest diameter by [place]. Due to elongated shape, two NeuWave XT MWA probes placed in lesion and ablation performed at 65W for 10 minutes. Ablation monitored and performed under [place] and CT guidance. Tracts cauterized on completion. ? ?Plan: ?PACU recovery. Overnight observation versus possible discharge later this afternoon/early evening depending on pain level. ? ?Eulas Post T. Kathlene Cote, M.D ?Pager:  [phone] ? ?  ?

## 2021-10-08 NOTE — Discharge Instructions (Signed)
May resume home medications, stay well-hydrated, do not drive after taking narcotic medications.  Avoid strenuous activity for the next 3 to 4 days. Call [phone] with any questions related to liver ablation procedure. ?

## 2021-10-08 NOTE — Discharge Summary (Signed)
? ?Patient ID: ?James Coffey ?MRN: [id number] ?DOB/AGE: December 30, 1970 51 y.o. ? ?Admit date: 10/08/2021 ?Discharge date: 10/08/2021 ? ?Supervising Physician: Aletta Edouard ? ?Patient Status: [facility] ? ?Admission Diagnoses: Metastatic rectal cancer to liver ? 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 ?Principal Problem: ?  Colorectal carcinoma ([place]) ? ?Past Medical History:  ?Diagnosis Date  ? Cancer [facility])   ? rectal cancer with mets to liver  ? Tobacco abuse   ? ?Past Surgical History:  ?Procedure Laterality Date  ? HERNIA REPAIR    ? INCISION AND DRAINAGE ABSCESS N/A 12/01/2020  ? Procedure: INCISION AND DRAINAGE ABSCESS;  Surgeon: Michael Boston, MD;  [facility];  Service: General;  Laterality: N/A;  ? INCISION AND DRAINAGE ABSCESS N/A 12/04/2020  ? Procedure: INCISION AND DRAINAGE FOURNIERS GANGRENE;  Surgeon: [name], MD;  [facility];  Service: General;  Laterality: N/A;  ? INCISION AND DRAINAGE PERIRECTAL ABSCESS N/A 11/30/2020  ? Procedure: INCISION AND DEBRIDEMENT OF PERIRECTAL ABSCESS AND RECTAL WALL BIOPSIES;  Surgeon: Michael Boston, MD;  [facility];  Service: General;  Laterality: N/A;  ? INGUINAL HERNIA REPAIR Right 2007  ? IR IMAGING GUIDED PORT INSERTION  05/07/2021  ? IR RADIOLOGIST EVAL & MGMT  08/27/2021  ? IR RADIOLOGIST EVAL & MGMT  09/12/2021  ? LAPAROSCOPIC LOOP COLOSTOMY N/A 12/13/2020  ? Procedure: DIAGNOSTIC LAPAROSCOPY WITH  OPEN DESCENDING LOOP COLOSTOMY;  Surgeon: Armandina Gemma, MD;  [facility];  Service: General;  Laterality: N/A;  ? LAPAROSCOPY N/A 08/28/2021  ? Procedure: LAPAROSCOPY DIAGNOSTIC;  Surgeon: Dwan Bolt, MD;  Location: [place];  Service: General;  Laterality: N/A;  ? OPEN PARTIAL HEPATECTOMY  Left 08/28/2021  ? Procedure: OPEN LEFT LATERAL HEPATECTOMY WITH INTRAOPERATIVE ULTRASOUND;  Surgeon: Dwan Bolt, MD;  Location: [place];  Service:  General;  Laterality: Left;  ? RECTAL SURGERY    ? ? ? ?Discharged Condition: good ? ?Hospital Course: James Coffey is a 51 year old male with history of metastatic rectal cancer to the liver.  He underwent consultation with Dr. Kathlene Cote recently to discuss treatment options and was deemed a candidate for US/CT guided microwave/thermal ablation of a segment 5/right hepatic lobe lesion/metastasis.  He is status post diverting descending colostomy in July 2022 and segment 3 liver lesion resection with positive pathology on 08/28/2021 by Dr. [name].  Earlier today he underwent ultrasound/CT-guided microwave/thermal ablation of right hepatic lobe metastasis (which currently measures 2 cm in greatest diameter) via general anesthesia He tolerated the procedure well and was extubated afterwards.  He was observed in PACU for 4 hours.  Vital signs were stable throughout.  He denied fever, headache, chest pain, dyspnea, cough, worsening abdominal/back pain, nausea, vomiting or bleeding.  He was able to tolerate his diet and void without difficulty. Findings were discussed with Dr. Kathlene Cote and he was deemed stable for discharge home at this time.  He will resume his usual home medications.  [facility] for oxycodone 5 mg 1 tablet every 6 hours as needed for moderate to severe pain, #20, no refills.  IR service will contact patient in 3 to 4 weeks to check on status.  Follow-up imaging will be performed in approximately 2 months.  He was told to contact our service with any additional questions or concerns.  He will continue oncologic care with Dr. Irene Limbo as scheduled. ? ? ? ?Consults: anesthesia ? ?  Significant Diagnostic Studies:  ?Results for orders placed or performed during the hospital encounter of 10/08/21  ?CBC with Differential/Platelet  ?Result Value Ref Range  ? WBC 14.2 (H) 4.0 - 10.5 K/uL  ? RBC 4.74 4.22 - 5.81 MIL/uL  ? Hemoglobin 14.1 13.0 - 17.0 g/dL  ? HCT 42.8 39.0 - 52.0 %   ? MCV 90.3 80.0 - 100.0 fL  ? MCH 29.7 26.0 - 34.0 pg  ? MCHC 32.9 30.0 - 36.0 g/dL  ? RDW 14.2 11.5 - 15.5 %  ? Platelets 245 150 - 400 K/uL  ? nRBC 0.0 0.0 - 0.2 %  ? Neutrophils Relative % 67 %  ? Neutro Abs 9.5 (H) 1.7 - 7.7 K/uL  ? Lymphocytes Relative 21 %  ? Lymphs Abs 3.0 0.7 - 4.0 K/uL  ? Monocytes Relative 8 %  ? Monocytes Absolute 1.2 (H) 0.1 - 1.0 K/uL  ? Eosinophils Relative 3 %  ? Eosinophils Absolute 0.4 0.0 - 0.5 K/uL  ? Basophils Relative 1 %  ? Basophils Absolute 0.1 0.0 - 0.1 K/uL  ? Immature Granulocytes 0 %  ? Abs Immature Granulocytes 0.06 0.00 - 0.07 K/uL  ? ? ? ?Treatments: CT/ultrasound-guided microwave/thermal ablation of right hepatic lobe metastasis (colorectal primary) on 10/08/2021 via general anesthesia ? ?Discharge Exam: ?Blood pressure 108/76, pulse 83, temperature 97.7 ?F (36.5 ?C), resp. rate 15, SpO2 97 %. ?Awake, alert.  Chest clear to auscultation bilaterally.  Heart with regular rate and rhythm.  Abdomen soft, positive bowel sounds, intact left-sided colostomy, puncture site right upper abdominal region clean, dry, nontender, no hematoma.  No lower extremity edema. ? ?Disposition: Discharge disposition: 01-Home or Self Care ? ? ? ? ? ? ?Discharge Instructions   ? ? Call MD for:  difficulty breathing, headache or visual disturbances   Complete by: As directed ?  ? Call MD for:  extreme fatigue   Complete by: As directed ?  ? Call MD for:  hives   Complete by: As directed ?  ? Call MD for:  persistant dizziness or light-headedness   Complete by: As directed ?  ? Call MD for:  persistant nausea and vomiting   Complete by: As directed ?  ? Call MD for:  redness, tenderness, or signs of infection (pain, swelling, redness, odor or green/yellow discharge around incision site)   Complete by: As directed ?  ? Call MD for:  severe uncontrolled pain   Complete by: As directed ?  ? Call MD for:  temperature >100.4   Complete by: As directed ?  ? Change dressing (specify)   Complete by: As  directed ?  ? Change bandage over puncture site right upper abdominal region daily for the next 2 to 3 days.  May wash site with soap and water.  ? Diet - low sodium heart healthy   Complete by: As directed ?  ? Discharge instructions   Complete by: As directed ?  ? May resume home medications, stay well-hydrated.  Do not drive after taking narcotic medications.  ? Driving Restrictions   Complete by: As directed ?  ? No driving for the next 24 hours or after taking narcotic medications  ? Increase activity slowly   Complete by: As directed ?  ? Lifting restrictions   Complete by: As directed ?  ? No heavy lifting for the next 3 to 4 days  ? May shower / Bathe   Complete by: As directed ?  ? May walk up  steps   Complete by: As directed ?  ? ?  ? ?Allergies as of 10/08/2021   ? ?   Reactions  ? Hydrocodone Itching  ? ?  ? ?  ?Medication List  ?  ? ?STOP taking these medications   ? ?lidocaine-prilocaine cream ?Commonly known as: EMLA ?  ? ?  ? ?TAKE these medications   ? ?acetaminophen 500 MG tablet ?Commonly known as: TYLENOL ?Take 2 tablets (1,000 mg total) by mouth every 8 (eight) hours as needed for mild pain or fever. ?  ?amoxicillin-clavulanate 875-125 MG tablet ?Commonly known as: AUGMENTIN ?Take 1 tablet by mouth 2 (two) times daily. ?  ?docusate sodium 100 MG capsule ?Commonly known as: COLACE ?Take 1 capsule (100 mg total) by mouth 2 (two) times daily. ?  ?ibuprofen 200 MG tablet ?Commonly known as: ADVIL ?Take 400 mg by mouth every 6 (six) hours as needed for moderate pain. ?  ?loperamide 2 MG capsule ?Commonly known as: IMODIUM ?Take 2 tablets by mouth at onset of diarrhea, then 1 tab every 2hrs until 12hr without a BM. May take 2 tab every 4hrs at bedtime. If diarrhea recurs repeat. ?  ?LORazepam 0.5 MG tablet ?Commonly known as: Ativan ?Take 1 tablet by mouth every 6 hours as needed for anxiety. ?What changed:  ?when to take this ?reasons to take this ?  ?oxyCODONE 5 MG immediate release tablet ?Commonly  known as: Oxy IR/ROXICODONE ?Take 1 tablet (5 mg total) by mouth every 6 (six) hours as needed for moderate pain. ?  ?prochlorperazine 10 MG tablet ?Commonly known as: COMPAZINE ?Take 1 tablet (10 mg total

## 2021-10-08 NOTE — Sedation Documentation (Signed)
Anesthesia in to sedate and monitor. 

## 2021-10-09 ENCOUNTER — Encounter (HOSPITAL_COMMUNITY): Payer: Self-pay | Admitting: Interventional Radiology

## 2021-10-12 NOTE — Progress Notes (Signed)
?Radiation Oncology         [phone] ?________________________________ ? ?Name: James Coffey        MRN: [id number]  ?Date of Service: 10/16/2021 DOB: Nov 28, 1970 ? ?KD:XIPJ, Cloria [place], MD  [name], MD    ? ?REFERRING PHYSICIAN: [name], MD ? ? ?DIAGNOSIS: The encounter diagnosis was Rectal cancer (James Coffey). ? ? ?HISTORY OF PRESENT ILLNESS: James Coffey is a 51 y.o. male seen at the request of Dr. Irene Limbo for a diagnosis of rectal carcinoma.  The patient also met Dr. Burr Medico at a time when Dr. Irene Limbo had been out of the office.  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  He also had a presacral lymph node that measured 2.2 cm, and was taken to the OR for incision and drainage on 11/30/2020.  At that time a rectal stricture were noted in the left lateral and right lateral rectal wall biopsy both confirmed adenocarcinoma arising from the GI tract at the level of the rectum.  CEA at that time of his hospitalization was 16.7 diagnostic laparoscopy on 12/13/2020 was performed so he could undergo descending loop colostomy.  No evidence of disease in the chest was seen by CT imaging he was counseled on the fact that clinically he was felt to be stage III by Dr. Irene Limbo and he underwent an MRI of the pelvis on 01/12/2021 showing at least T3c disease with possible erosion of the prostate. His staging for nodal disease was N2 by MRI, and his tumor was noted to be involving the anal sphincter.  He was counseled on total neoadjuvant chemotherapy which he began on 01/20/2021 and completed 7 cycles on 08/15/21. The patient was taken to the OR on 3/23 for a staging  laparoscopy and open partial left hepatectomy by Dr. [name]. He also underwent IR microwave ablation on 10/08/21 of a right liver lesion with Dr. Kathlene Cote.  He did also have a 7 mm RUL nodule seen during CT chest on 07/15/21 that was stable in short interval but new from his initial staging work up. He is seen today to consider Stereotactic body radiotherapy (SBRT) to the RUL nodue,  and to consider chemoRT to the pelvis given his performance status and response to treatment.  ? ? ? ? ?PREVIOUS RADIATION THERAPY: No ? ? ?PAST MEDICAL HISTORY:  ?Past Medical History:  ?Diagnosis Date  ? Cancer [facility])   ? rectal cancer with mets to liver  ? Tobacco abuse   ?   ? ? ?PAST SURGICAL HISTORY: ?Past Surgical History:  ?Procedure Laterality Date  ? HERNIA REPAIR    ? INCISION AND DRAINAGE ABSCESS N/A 12/01/2020  ? Procedure: INCISION AND DRAINAGE ABSCESS;  Surgeon: Michael Boston, MD;  [facility];  Service: General;  Laterality: N/A;  ? INCISION AND DRAINAGE ABSCESS N/A 12/04/2020  ? Procedure: INCISION AND DRAINAGE FOURNIERS GANGRENE;  Surgeon: [name], MD;  [facility];  Service: General;  Laterality: N/A;  ? INCISION AND DRAINAGE PERIRECTAL ABSCESS N/A 11/30/2020  ? Procedure: INCISION AND DEBRIDEMENT OF PERIRECTAL ABSCESS AND RECTAL WALL BIOPSIES;  Surgeon: Michael Boston, MD;  [facility];  Service: General;  Laterality: N/A;  ?  INGUINAL HERNIA REPAIR Right 2007  ? IR IMAGING GUIDED PORT INSERTION  05/07/2021  ? IR RADIOLOGIST EVAL & MGMT  08/27/2021  ? IR RADIOLOGIST EVAL & MGMT  09/12/2021  ? LAPAROSCOPIC LOOP COLOSTOMY N/A 12/13/2020  ? Procedure: DIAGNOSTIC LAPAROSCOPY WITH  OPEN DESCENDING LOOP COLOSTOMY;  Surgeon: Armandina Gemma, MD;  [facility];  Service: General;  Laterality: N/A;  ? LAPAROSCOPY N/A 08/28/2021  ? Procedure: LAPAROSCOPY DIAGNOSTIC;  Surgeon: Dwan Bolt, MD;  Location: [place];  Service: General;  Laterality: N/A;  ? OPEN PARTIAL HEPATECTOMY  Left 08/28/2021  ? Procedure: OPEN  LEFT LATERAL HEPATECTOMY WITH INTRAOPERATIVE ULTRASOUND;  Surgeon: Dwan Bolt, MD;  Location: [place];  Service: General;  Laterality: Left;  ? RADIOLOGY WITH ANESTHESIA N/A 10/08/2021  ? Procedure: MICROWAVE ABLATION;  Surgeon: Aletta Edouard, MD;  [facility];  Service: Radiology;  Laterality: N/A;  ? RECTAL SURGERY    ? ? ? ?FAMILY HISTORY:  ?Family History  ?Problem Relation Age of Onset  ? CAD Neg Hx   ? Inflammatory bowel disease Neg Hx   ? ? ? ?SOCIAL HISTORY:  reports that he has been smoking cigarettes. He has been smoking an average of .25 packs per day. He has never used smokeless tobacco. He reports that he does not drink alcohol and does not use drugs. The patient is married and lives in [place].  He is originally from [place]. He drives trucks for a living. ? ? ?ALLERGIES: Hydrocodone ? ? ?MEDICATIONS:  ?Current Outpatient Medications  ?Medication Sig Dispense Refill  ? acetaminophen (TYLENOL) 500 MG tablet Take 2 tablets (1,000 mg total) by mouth every 8 (eight) hours as needed for mild pain or fever.    ? amoxicillin-clavulanate (AUGMENTIN) 875-125 MG tablet Take 1 tablet by mouth 2 (two) times daily. 20 tablet 0  ? docusate sodium (COLACE) 100 MG capsule Take 1 capsule (100 mg total) by mouth 2 (two) times daily. 10 capsule 0  ? ibuprofen (ADVIL) 200 MG tablet Take 400 mg by mouth every 6 (six) hours as needed for moderate pain.    ? loperamide (IMODIUM) 2 MG capsule Take 2 tablets by mouth at onset of diarrhea, then 1 tab every 2hrs until 12hr without a BM. May take 2 tab every 4hrs at bedtime. If diarrhea recurs repeat. (Patient not taking: Reported on 09/29/2021) 30 capsule 1  ? LORazepam (ATIVAN) 0.5 MG tablet Take 1 tablet by mouth every 6 hours as needed for anxiety. (Patient taking differently: Take 0.5 mg by mouth at bedtime as needed for anxiety or sleep.) 30 tablet 0  ? oxyCODONE (OXY IR/ROXICODONE) 5 MG immediate release tablet Take 1 tablet (5 mg total) by mouth every 6 (six)  hours as needed for moderate pain. 20 tablet 0  ? prochlorperazine (COMPAZINE) 10 MG tablet Take 1 tablet (10 mg total) by mouth every 6 (six) hours as needed for nausea or vomiting. (Patient not taking: Reported on 09/29/2021) 30 tablet 1  ? ?No current facility-administered medications for this visit.  ? ?Facility-Administered Medications Ordered in Other Visits  ?Medication Dose Route Frequency Provider Last Rate Last Admin  ? palonosetron (ALOXI) 0.25 MG/5ML injection           ? sodium chloride flush (NS) 0.9 % injection 10 mL  10 mL Intracatheter Once [name], MD      ? ? ? ?REVIEW OF SYSTEMS: On review of systems, the patient reports that he is doing well. He's still a  bit sore from his recent liver surgery and ablation. He continues to manage his ostomy and reports he is still having mucous per rectum but no blood or pain. He denies any shortness of breath or chest pain. No other complaints are verbalized.  ? ?  ? ?PHYSICAL EXAM:  ?Wt Readings from Last 3 Encounters:  ?10/02/21 173 lb (78.5 kg)  ?09/26/21 175 lb 6.4 oz (79.6 kg)  ?08/28/21 178 lb (80.7 kg)  ? ?Temp Readings from Last 3 Encounters:  ?10/08/21 97.8 ?F (36.6 ?C)  ?10/02/21 98.2 ?F (36.8 ?C) (Oral)  ?09/26/21 97.9 ?F (36.6 ?C)  ? ?BP Readings from Last 3 Encounters:  ?10/08/21 115/85  ?10/02/21 117/81  ?09/26/21 101/73  ? ?Pulse Readings from Last 3 Encounters:  ?10/08/21 81  ?10/02/21 86  ?09/26/21 84  ? ? /10 ? ?In general this is a well appearing [place] european male in no acute distress. He's alert and oriented x4 and appropriate throughout the examination. Cardiopulmonary assessment is negative for acute distress and he exhibits normal effort.  ? ? ? ?ECOG = 1 ? ?0 - Asymptomatic (Fully active, able to carry on all predisease activities without restriction) ? ?1 - Symptomatic but completely ambulatory (Restricted in physically strenuous activity but ambulatory and able to carry out work of a light or sedentary nature. For  example, light housework, office work) ? ?2 - Symptomatic, <50% in bed during the day (Ambulatory and capable of all self care but unable to carry out any work activities. Up and about more than 50% of waking hou

## 2021-10-15 ENCOUNTER — Other Ambulatory Visit: Payer: Self-pay

## 2021-10-15 DIAGNOSIS — C787 Secondary malignant neoplasm of liver and intrahepatic bile duct: Secondary | ICD-10-CM

## 2021-10-16 ENCOUNTER — Encounter: Payer: Self-pay | Admitting: Radiation Oncology

## 2021-10-16 ENCOUNTER — Ambulatory Visit
Admission: RE | Admit: 2021-10-16 | Discharge: 2021-10-16 | Disposition: A | Discharge status: Home / Self Care | Payer: Medicaid Other | Source: Ambulatory Visit | Attending: Radiation Oncology | Admitting: Radiation Oncology

## 2021-10-16 ENCOUNTER — Other Ambulatory Visit: Payer: Self-pay

## 2021-10-16 VITALS — BP 114/78 | HR 91 | Temp 97.5°F | Resp 18 | Ht 69.0 in | Wt 176.2 lb

## 2021-10-16 DIAGNOSIS — C787 Secondary malignant neoplasm of liver and intrahepatic bile duct: Secondary | ICD-10-CM | POA: Insufficient documentation

## 2021-10-16 DIAGNOSIS — F17210 Nicotine dependence, cigarettes, uncomplicated: Secondary | ICD-10-CM | POA: Insufficient documentation

## 2021-10-16 DIAGNOSIS — C20 Malignant neoplasm of rectum: Secondary | ICD-10-CM

## 2021-10-16 NOTE — Progress Notes (Signed)
Follow-up-new appointment. I verified patient identity and began nursing interview. Patient reports rectal tenderness 2/10 and dryness. No other issues reported at this time. ? ?Meaningful use complete. ? ?BP 114/78 (BP Location: Left Arm, Patient Position: Sitting, Cuff Size: Normal)   Pulse 91   Temp (!) 97.5 ?F (36.4 ?C) (Temporal)   Resp 18   Ht '5\' 9"'$  (1.753 m)   Wt 176 lb 4 oz (79.9 kg)   SpO2 100%   BMI 26.03 kg/m?  ? ?

## 2021-10-21 ENCOUNTER — Ambulatory Visit
Admission: RE | Admit: 2021-10-21 | Discharge: 2021-10-21 | Disposition: A | Discharge status: Home / Self Care | Payer: Medicaid Other | Source: Ambulatory Visit | Attending: Radiation Oncology | Admitting: Radiation Oncology

## 2021-10-21 ENCOUNTER — Other Ambulatory Visit: Payer: Self-pay

## 2021-10-21 DIAGNOSIS — C20 Malignant neoplasm of rectum: Secondary | ICD-10-CM | POA: Insufficient documentation

## 2021-10-21 DIAGNOSIS — Z510 Encounter for antineoplastic radiation therapy: Secondary | ICD-10-CM | POA: Insufficient documentation

## 2021-10-21 DIAGNOSIS — C787 Secondary malignant neoplasm of liver and intrahepatic bile duct: Secondary | ICD-10-CM | POA: Insufficient documentation

## 2021-10-21 DIAGNOSIS — C775 Secondary and unspecified malignant neoplasm of intrapelvic lymph nodes: Secondary | ICD-10-CM | POA: Insufficient documentation

## 2021-10-23 ENCOUNTER — Telehealth: Payer: Self-pay | Admitting: Pharmacist

## 2021-10-23 ENCOUNTER — Other Ambulatory Visit: Payer: Self-pay | Admitting: Hematology

## 2021-10-23 ENCOUNTER — Other Ambulatory Visit (HOSPITAL_COMMUNITY): Payer: Self-pay

## 2021-10-23 ENCOUNTER — Telehealth: Payer: Self-pay | Admitting: Pharmacy Technician

## 2021-10-23 DIAGNOSIS — Z510 Encounter for antineoplastic radiation therapy: Secondary | ICD-10-CM | POA: Diagnosis not present

## 2021-10-23 DIAGNOSIS — C20 Malignant neoplasm of rectum: Secondary | ICD-10-CM

## 2021-10-23 MED ORDER — CAPECITABINE 500 MG PO TABS
625.0000 mg/m2 | ORAL_TABLET | Freq: Two times a day (BID) | ORAL | 0 refills | Status: DC
  Filled 2021-10-23: qty 120, 30d supply, fill #0

## 2021-10-23 NOTE — Telephone Encounter (Addendum)
Oral Oncology Pharmacist Encounter  Received new prescription for Xeloda (capecitabine) for the treatment of metastatic rectal cancer in conjunction with radiation, planned duration 5 1/2 weeks.  CBC w/ Diff from 10/08/21 and CMP from 10/02/21 assessed, no relevant lab abnormalities noted. Prescription dose and frequency assessed for appropriateness. Confirmed with Dr. Irene Limbo that patient will be started on dose reduction of Xeloda. Dose may be escalated pending tolerance.   Current medication list in Epic reviewed, no relevant/significant DDIs with Xeloda identified.  Evaluated chart and no patient barriers to medication adherence noted.   Prescription has been e-scribed to the [facility] for benefits analysis and approval.  Oral Oncology Clinic will continue to follow for insurance authorization, copayment issues, initial counseling and start date.  Leron Croak, PharmD, BCPS Hematology/Oncology Clinical Pharmacist [name] [place] [phone] 10/23/2021 4:33 PM

## 2021-10-23 NOTE — Telephone Encounter (Signed)
Oral Oncology Patient Advocate Encounter  After completing a benefits investigation, prior authorization for Xeloda is not required at this time through [facility].  Patient's copay is $0.     Lady Deutscher, CPhT-Adv Pharmacy Patient Advocate Specialist [place] Patient Advocate Team Direct Number: [phone]  Fax: [phone]

## 2021-10-24 ENCOUNTER — Other Ambulatory Visit: Payer: Self-pay

## 2021-10-24 ENCOUNTER — Inpatient Hospital Stay: Payer: Medicaid Other

## 2021-10-24 ENCOUNTER — Other Ambulatory Visit (HOSPITAL_COMMUNITY): Payer: Self-pay

## 2021-10-24 ENCOUNTER — Inpatient Hospital Stay (HOSPITAL_BASED_OUTPATIENT_CLINIC_OR_DEPARTMENT_OTHER): Payer: Medicaid Other | Admitting: Hematology

## 2021-10-24 VITALS — BP 97/66 | HR 78 | Temp 98.1°F | Resp 20 | Wt 174.5 lb

## 2021-10-24 DIAGNOSIS — C20 Malignant neoplasm of rectum: Secondary | ICD-10-CM

## 2021-10-24 DIAGNOSIS — Z510 Encounter for antineoplastic radiation therapy: Secondary | ICD-10-CM | POA: Diagnosis not present

## 2021-10-24 DIAGNOSIS — Z95828 Presence of other vascular implants and grafts: Secondary | ICD-10-CM

## 2021-10-24 DIAGNOSIS — C775 Secondary and unspecified malignant neoplasm of intrapelvic lymph nodes: Secondary | ICD-10-CM | POA: Insufficient documentation

## 2021-10-24 DIAGNOSIS — C787 Secondary malignant neoplasm of liver and intrahepatic bile duct: Secondary | ICD-10-CM | POA: Insufficient documentation

## 2021-10-24 LAB — CMP (CANCER CENTER ONLY)
ALT: 14 U/L (ref 0–44)
AST: 11 U/L — ABNORMAL LOW (ref 15–41)
Albumin: 4.1 g/dL (ref 3.5–5.0)
Alkaline Phosphatase: 128 U/L — ABNORMAL HIGH (ref 38–126)
Anion gap: 7 (ref 5–15)
BUN: 13 mg/dL (ref 6–20)
CO2: 25 mmol/L (ref 22–32)
Calcium: 9.6 mg/dL (ref 8.9–10.3)
Chloride: 106 mmol/L (ref 98–111)
Creatinine: 0.76 mg/dL (ref 0.61–1.24)
GFR, Estimated: >60 mL/min (ref >60)
Glucose, Bld: 121 mg/dL — ABNORMAL HIGH (ref 70–99)
Potassium: 4 mmol/L (ref 3.5–5.1)
Sodium: 138 mmol/L (ref 135–145)
Total Bilirubin: 0.9 mg/dL (ref 0.3–1.2)
Total Protein: 8.2 g/dL — ABNORMAL HIGH (ref 6.5–8.1)

## 2021-10-24 LAB — CBC WITH DIFFERENTIAL (CANCER CENTER ONLY)
Abs Immature Granulocytes: 0.06 10*3/uL (ref 0.00–0.07)
Basophils Absolute: 0.1 10*3/uL (ref 0.0–0.1)
Basophils Relative: 1 %
Eosinophils Absolute: 0.2 10*3/uL (ref 0.0–0.5)
Eosinophils Relative: 1 %
HCT: 40.1 % (ref 39.0–52.0)
Hemoglobin: 13.5 g/dL (ref 13.0–17.0)
Immature Granulocytes: 0 %
Lymphocytes Relative: 16 %
Lymphs Abs: 2.6 10*3/uL (ref 0.7–4.0)
MCH: 29.6 pg (ref 26.0–34.0)
MCHC: 33.7 g/dL (ref 30.0–36.0)
MCV: 87.9 fL (ref 80.0–100.0)
Monocytes Absolute: 1.1 10*3/uL — ABNORMAL HIGH (ref 0.1–1.0)
Monocytes Relative: 7 %
Neutro Abs: 12 10*3/uL — ABNORMAL HIGH (ref 1.7–7.7)
Neutrophils Relative %: 75 %
Platelet Count: 341 10*3/uL (ref 150–400)
RBC: 4.56 MIL/uL (ref 4.22–5.81)
RDW: 14.1 % (ref 11.5–15.5)
WBC Count: 16.1 10*3/uL — ABNORMAL HIGH (ref 4.0–10.5)
nRBC: 0 % (ref 0.0–0.2)

## 2021-10-24 LAB — CEA (IN HOUSE-CHCC): CEA (CHCC-In House): 3.07 ng/mL (ref 0.00–5.00)

## 2021-10-24 MED ORDER — SODIUM CHLORIDE 0.9% FLUSH
10.0000 mL | Freq: Once | INTRAVENOUS | Status: AC
  Administered 2021-10-24: 10 mL

## 2021-10-24 MED ORDER — HEPARIN SOD (PORK) LOCK FLUSH 100 UNIT/ML IV SOLN
500.0000 [IU] | Freq: Once | INTRAVENOUS | Status: AC
  Administered 2021-10-24: 500 [IU]

## 2021-10-24 MED ORDER — CAPECITABINE 500 MG PO TABS
625.0000 mg/m2 | ORAL_TABLET | Freq: Two times a day (BID) | ORAL | 0 refills | Status: DC
  Filled 2021-10-24 (×2): qty 120, 30d supply, fill #0

## 2021-10-24 NOTE — Telephone Encounter (Signed)
Oral Chemotherapy Pharmacist Encounter  I spoke with patient's daughter for overview of: Xeloda for the treatment of metastatic rectal cancer in conjunction with radiation, planned duration 5 1/2 weeks.  Reviewed administration, dosing, side effects, monitoring, drug-food interactions, safe handling, storage, and disposal.  Patient will take Xeloda '500mg'$  tablets, 2 tablets ('1000mg'$ ) by mouth in AM and 2 tabs ('1000mg'$ ) by mouth in PM, within 30 minutes of finishing meals, on days of radiation only.  Xeloda and radiation start date: 10/27/21  Adverse effects of Xeloda include but are not limited to: fatigue, decreased blood counts, GI upset, diarrhea, mouth sores, and hand-foot syndrome.  Patient has anti-emetic on hand and knows to take it if nausea develops.   Patient will obtain anti diarrheal and alert the office of 4 or more loose stools above baseline.   Reviewed importance of keeping a medication schedule and plan for any missed doses. No barriers to medication adherence identified.  Medication reconciliation performed and medication/allergy list updated.  Insurance authorization for Xeloda has been obtained. Test claim at the pharmacy revealed copayment $0 for 1st fill of Xeloda. Patient will pick this up from the [facility] on 10/24/21.  Patient's daughter informed the pharmacy will reach out 5-7 days prior to needing next fill of Xeloda to coordinate continued medication acquisition to prevent break in therapy.  All questions answered.  Noel Gerold voiced understanding and appreciation.   Medication education handout placed in mail for patient and patient's daughter. Patient and family know to call the office with questions or concerns. Oral Chemotherapy Clinic phone number provided.  Leron Croak, PharmD, BCPS Hematology/Oncology Clinical Pharmacist [name] [place] [phone] 10/24/2021 10:44 AM

## 2021-10-27 ENCOUNTER — Telehealth: Payer: Self-pay | Admitting: Hematology

## 2021-10-27 ENCOUNTER — Ambulatory Visit
Admission: RE | Admit: 2021-10-27 | Discharge: 2021-10-27 | Disposition: A | Discharge status: Home / Self Care | Payer: Medicaid Other | Source: Ambulatory Visit | Attending: Radiation Oncology | Admitting: Radiation Oncology

## 2021-10-27 ENCOUNTER — Other Ambulatory Visit: Payer: Self-pay

## 2021-10-27 DIAGNOSIS — Z510 Encounter for antineoplastic radiation therapy: Secondary | ICD-10-CM | POA: Diagnosis not present

## 2021-10-27 LAB — RAD ONC ARIA SESSION SUMMARY
Course Elapsed Days: 0
Plan Fractions Treated to Date: 1
Plan Prescribed Dose Per Fraction: 1.8 Gy
Plan Total Fractions Prescribed: 25
Plan Total Prescribed Dose: 45 Gy
Reference Point Dosage Given to Date: 1.8 Gy
Reference Point Session Dosage Given: 1.8 Gy
Session Number: 1

## 2021-10-27 NOTE — Telephone Encounter (Signed)
Left message with follow-up appointment per 5/19 los

## 2021-10-28 ENCOUNTER — Ambulatory Visit
Admission: RE | Admit: 2021-10-28 | Discharge: 2021-10-28 | Disposition: A | Discharge status: Home / Self Care | Payer: Medicaid Other | Source: Ambulatory Visit | Attending: Radiation Oncology | Admitting: Radiation Oncology

## 2021-10-28 ENCOUNTER — Other Ambulatory Visit: Payer: Self-pay

## 2021-10-28 DIAGNOSIS — Z510 Encounter for antineoplastic radiation therapy: Secondary | ICD-10-CM | POA: Diagnosis not present

## 2021-10-28 LAB — RAD ONC ARIA SESSION SUMMARY
Course Elapsed Days: 1
Plan Fractions Treated to Date: 2
Plan Prescribed Dose Per Fraction: 1.8 Gy
Plan Total Fractions Prescribed: 25
Plan Total Prescribed Dose: 45 Gy
Reference Point Dosage Given to Date: 3.6 Gy
Reference Point Session Dosage Given: 1.8 Gy
Session Number: 2

## 2021-10-29 ENCOUNTER — Ambulatory Visit
Admission: RE | Admit: 2021-10-29 | Discharge: 2021-10-29 | Disposition: A | Discharge status: Home / Self Care | Payer: Medicaid Other | Source: Ambulatory Visit | Attending: Radiation Oncology | Admitting: Radiation Oncology

## 2021-10-29 ENCOUNTER — Other Ambulatory Visit: Payer: Self-pay

## 2021-10-29 DIAGNOSIS — Z510 Encounter for antineoplastic radiation therapy: Secondary | ICD-10-CM | POA: Diagnosis not present

## 2021-10-29 LAB — RAD ONC ARIA SESSION SUMMARY
Course Elapsed Days: 2
Plan Fractions Treated to Date: 3
Plan Prescribed Dose Per Fraction: 1.8 Gy
Plan Total Fractions Prescribed: 25
Plan Total Prescribed Dose: 45 Gy
Reference Point Dosage Given to Date: 5.4 Gy
Reference Point Session Dosage Given: 1.8 Gy
Session Number: 3

## 2021-10-30 ENCOUNTER — Other Ambulatory Visit: Payer: Self-pay

## 2021-10-30 ENCOUNTER — Ambulatory Visit
Admission: RE | Admit: 2021-10-30 | Discharge: 2021-10-30 | Disposition: A | Discharge status: Home / Self Care | Payer: Medicaid Other | Source: Ambulatory Visit | Attending: Radiation Oncology | Admitting: Radiation Oncology

## 2021-10-30 DIAGNOSIS — Z510 Encounter for antineoplastic radiation therapy: Secondary | ICD-10-CM | POA: Diagnosis not present

## 2021-10-30 DIAGNOSIS — C20 Malignant neoplasm of rectum: Secondary | ICD-10-CM

## 2021-10-30 LAB — RAD ONC ARIA SESSION SUMMARY
Course Elapsed Days: 3
Plan Fractions Treated to Date: 4
Plan Prescribed Dose Per Fraction: 1.8 Gy
Plan Total Fractions Prescribed: 25
Plan Total Prescribed Dose: 45 Gy
Reference Point Dosage Given to Date: 7.2 Gy
Reference Point Session Dosage Given: 1.8 Gy
Session Number: 4

## 2021-10-30 MED ORDER — SONAFINE EX EMUL
1.0000 "application " | Freq: Once | CUTANEOUS | Status: AC
  Administered 2021-10-30: 1 via TOPICAL

## 2021-10-30 NOTE — Progress Notes (Signed)
Pt here for patient teaching.  Pt given Radiation and You booklet, skin care instructions, and Sonafine.  Reviewed areas of pertinence such as diarrhea, fatigue, hair loss, sexual and fertility changes, skin changes, and urinary and bladder changes . Pt able to give teach back of to pat skin, use unscented/gentle soap, use baby wipes, have Imodium on hand, drink plenty of water, and sitz bath,apply Sonafine bid and avoid applying anything to skin within 4 hours of treatment. Pt verbalizes understanding of information given and will contact nursing with any questions or concerns.   Darien Mignogna M. Cathren Sween RN, BSN   

## 2021-10-31 ENCOUNTER — Other Ambulatory Visit: Payer: Self-pay

## 2021-10-31 ENCOUNTER — Encounter: Payer: Self-pay | Admitting: Hematology

## 2021-10-31 ENCOUNTER — Ambulatory Visit
Admission: RE | Admit: 2021-10-31 | Discharge: 2021-10-31 | Disposition: A | Discharge status: Home / Self Care | Payer: Medicaid Other | Source: Ambulatory Visit | Attending: Radiation Oncology | Admitting: Radiation Oncology

## 2021-10-31 DIAGNOSIS — Z510 Encounter for antineoplastic radiation therapy: Secondary | ICD-10-CM | POA: Diagnosis not present

## 2021-10-31 LAB — RAD ONC ARIA SESSION SUMMARY
Course Elapsed Days: 4
Plan Fractions Treated to Date: 5
Plan Prescribed Dose Per Fraction: 1.8 Gy
Plan Total Fractions Prescribed: 25
Plan Total Prescribed Dose: 45 Gy
Reference Point Dosage Given to Date: 9 Gy
Reference Point Session Dosage Given: 1.8 Gy
Session Number: 5

## 2021-10-31 NOTE — Progress Notes (Signed)
James Coffey   [phone] [phone]   Clinic Follow up Note   Patient Care Team: [name], MD as PCP - General (Hematology) [name], MD as Consulting Physician (Urology) [name], MD as Consulting Physician (Gastroenterology) [name], MD as Consulting Physician (Colon and Rectal Surgery) [name], MD as Consulting Physician (Oncology) Royston Bake, RN as Oncology Nurse Navigator (Oncology)  Date of Service:  10/24/2021  CHIEF COMPLAINT:  Follow-up for continued evaluation and management of oligometastatic rectal cancer  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[place])  12/17/2020 Initial Diagnosis   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[place])    01/13/2021 Cancer Staging   Staging form: Colon and Rectum, AJCC 8th Edition - Clinical stage from 01/13/2021: Stage IIIB (cT3, cN2a, cM0) - Signed by [name], MD on 01/19/2021 Stage prefix: Initial diagnosis Histologic grade (G): G2 Histologic grading system: 4 grade system    01/20/2021 - 03/26/2021 Chemotherapy   Patient is on Treatment Plan : RECTAL Xelox ([place]) q21d x 6 cycles      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   INTERVAL HISTORY:   James Coffey is here for continued  management of his oligometastatic rectal cancer.  Completed ablation to his second liver metastasis after having the first 1 resected. He is scheduled to start radiation to his rectal primary on 10/27/2021 and we shall  be starting him on chemo sensitizing Xeloda at 625 mg per metered square.  He has been heavily pretreated with chemotherapy and so we want to be conservative with his sensitizing chemotherapy.  Depending on his labs and recovery from surgery and p.o. intake might increase the dose of Xeloda some. No fevers no chills no night sweats. Mildly decreased appetite but eating reasonably well. No rectal pain and no rectal bleeding or bleeding into his colostomy. No other acute new symptoms since we last met. Connected with radiation oncology to coordinate care.  He will have SBRT to his lung lesion after completion of chemoradiation for his rectal primary. Labs done today were discussed in detail with the patient.  MEDICAL HISTORY:  Past Medical History:  Diagnosis Date   Cancer ([place])    rectal cancer with mets to liver   Tobacco abuse     SURGICAL HISTORY: Past Surgical History:  Procedure Laterality Date   HERNIA REPAIR     INCISION AND DRAINAGE ABSCESS N/A 12/01/2020   Procedure: INCISION AND DRAINAGE ABSCESS;  Surgeon: Michael Boston, MD;  Location: [facility];  Service: General;  Laterality: N/A;   INCISION AND DRAINAGE ABSCESS N/A 12/04/2020   Procedure: INCISION AND DRAINAGE FOURNIERS GANGRENE;  Surgeon: [name], MD;  Location: [facility];  Service: General;  Laterality: N/A;   INCISION AND DRAINAGE PERIRECTAL ABSCESS N/A 11/30/2020   Procedure: INCISION AND DEBRIDEMENT OF PERIRECTAL ABSCESS AND RECTAL WALL BIOPSIES;  Surgeon: Michael Boston, MD;  Location: [facility];  Service: General;  Laterality: N/A;   INGUINAL HERNIA REPAIR Right 2007   IR IMAGING GUIDED PORT INSERTION  05/07/2021   IR RADIOLOGIST  EVAL & MGMT  08/27/2021   IR RADIOLOGIST EVAL & MGMT  09/12/2021   LAPAROSCOPIC LOOP COLOSTOMY N/A 12/13/2020   Procedure: DIAGNOSTIC LAPAROSCOPY WITH  OPEN DESCENDING LOOP COLOSTOMY;  Surgeon: Armandina Gemma, MD;  Location: [facility];  Service: General;  Laterality: N/A;   LAPAROSCOPY N/A 08/28/2021    Procedure: LAPAROSCOPY DIAGNOSTIC;  Surgeon: Dwan Bolt, MD;  Location: [place];  Service: General;  Laterality: N/A;   OPEN PARTIAL HEPATECTOMY  Left 08/28/2021   Procedure: OPEN LEFT LATERAL HEPATECTOMY WITH INTRAOPERATIVE ULTRASOUND;  Surgeon: Dwan Bolt, MD;  Location: [place];  Service: General;  Laterality: Left;   RADIOLOGY WITH ANESTHESIA N/A 10/08/2021   Procedure: MICROWAVE ABLATION;  Surgeon: Aletta Edouard, MD;  Location: [facility];  Service: Radiology;  Laterality: N/A;   RECTAL SURGERY      ALLERGIES:  is allergic to hydrocodone.  ROS 10 Point review of Systems was done is negative except as noted above.   MEDICATIONS:  Current Outpatient Medications  Medication Sig Dispense Refill   acetaminophen (TYLENOL) 500 MG tablet Take 2 tablets (1,000 mg total) by mouth every 8 (eight) hours as needed for mild pain or fever.     amoxicillin-clavulanate (AUGMENTIN) 875-125 MG tablet Take 1 tablet by mouth 2 (two) times daily. 20 tablet 0   capecitabine (XELODA) 500 MG tablet Take 2 tablets (1,000 mg total) by mouth 2 (two) times daily after a meal. Take within 30 minutes after meals. Take only on days of radiation Monday through Friday. 120 tablet 0   docusate sodium (COLACE) 100 MG capsule Take 1 capsule (100 mg total) by mouth 2 (two) times daily. 10 capsule 0   ibuprofen (ADVIL) 200 MG tablet Take 400 mg by mouth every 6 (six) hours as needed for moderate pain.     loperamide (IMODIUM) 2 MG capsule Take 2 tablets by mouth at onset of diarrhea, then 1 tab every 2hrs until 12hr without a BM. May take 2 tab every 4hrs at bedtime. If diarrhea recurs repeat. (Patient not taking: Reported on 09/29/2021) 30 capsule 1   LORazepam (ATIVAN) 0.5 MG tablet Take 1 tablet by mouth every 6 hours as needed for anxiety. (Patient taking differently: Take 0.5 mg by mouth at bedtime as needed for anxiety or sleep.) 30 tablet 0   oxyCODONE (OXY IR/ROXICODONE) 5 MG immediate release tablet Take 1 tablet  (5 mg total) by mouth every 6 (six) hours as needed for moderate pain. 20 tablet 0   prochlorperazine (COMPAZINE) 10 MG tablet Take 1 tablet (10 mg total) by mouth every 6 (six) hours as needed for nausea or vomiting. (Patient not taking: Reported on 09/29/2021) 30 tablet 1   No current facility-administered medications for this visit.   Facility-Administered Medications Ordered in Other Visits  Medication Dose Route Frequency Provider Last Rate Last Admin   palonosetron (ALOXI) 0.25 MG/5ML injection            sodium chloride flush (NS) 0.9 % injection 10 mL  10 mL Intracatheter Once [name], MD        PHYSICAL EXAMINATION: ECOG PERFORMANCE STATUS: 1 - Symptomatic but completely ambulatory  Vital signs reviewed Wt Readings from Last 3 Encounters:  10/24/21 174 lb 8 oz (79.2 kg)  10/16/21 176 lb 4 oz (79.9 kg)  10/02/21 173 lb (78.5 kg)  NAD GENERAL:alert, in no acute distress and comfortable SKIN: no acute rashes, no significant lesions EYES: conjunctiva are pink and non-injected, sclera anicteric OROPHARYNX: MMM, no  exudates, no oropharyngeal erythema or ulceration NECK: supple, no JVD LYMPH:  no palpable lymphadenopathy in the cervical, axillary or inguinal regions LUNGS: clear to auscultation b/l with normal respiratory effort HEART: regular rate & rhythm ABDOMEN:  normoactive bowel sounds , non tender, not distended.  Colostomy in situ Extremity: no pedal edema PSYCH: alert & oriented x 3 with fluent speech NEURO: no focal motor/sensory deficits   LABORATORY DATA:  .    Latest Ref Rng & Units 10/24/2021    9:17 AM 10/08/2021    7:00 AM 10/02/2021    9:07 AM  CBC  WBC 4.0 - 10.5 K/uL 16.1   14.2   13.5    Hemoglobin 13.0 - 17.0 g/dL 13.5   14.1   13.9    Hematocrit 39.0 - 52.0 % 40.1   42.8   41.8    Platelets 150 - 400 K/uL 341   245   261     .    Latest Ref Rng & Units 10/24/2021    9:17 AM 10/02/2021    9:07 AM 09/26/2021    9:13 AM  CMP  Glucose 70  - 99 mg/dL 121   130   132    BUN 6 - 20 mg/dL _0 Creatinine 0.61 - 1.24 mg/dL 0.76   0.75   0.74    Sodium 135 - 145 mmol/L 138   137   139    Potassium 3.5 - 5.1 mmol/L 4.0   4.0   4.0    Chloride 98 - 111 mmol/L 106   110   107    CO2 22 - 32 mmol/L _1 Calcium 8.9 - 10.3 mg/dL 9.6   9.4   9.4    Total Protein 6.5 - 8.1 g/dL 8.2   7.7   7.8    Total Bilirubin 0.3 - 1.2 mg/dL 0.9   0.8   0.4    Alkaline Phos 38 - 126 U/L 128   107   126    AST 15 - 41 U/L _2 ALT 0 - 44 U/L _3 Lab Results  Component Value Date   IRON 78 07/31/2021   TIBC 420 07/31/2021   IRONPCTSAT 19 07/31/2021   (Iron and TIBC)  Lab Results  Component Value Date   FERRITIN 142 07/31/2021     SURGICAL PATHOLOGY   THIS IS AN ADDENDUM REPORT   CASE: [id number]  PATIENT: [facility]  Surgical Pathology Report  Addendum    Reason for Addendum #1:  DNA Mismatch Repair IHC Results   Clinical History: Fournier's Gangrene; Cancer ? ([facility])      FINAL MICROSCOPIC DIAGNOSIS:   A. IRRIGATION AND DEBRIDEMENT OF SCROTUM AND PENIS:  Acute necrotizing inflammation extensively involving fascia    B. Rectal wall biopsies:  Adenocarcinoma of the rectum        RADIOGRAPHIC STUDIES: I have personally reviewed the radiological images as listed and agreed with the findings in the report. No results found.   ASSESSMENT & PLAN:   James Coffey is a 51 y.o. male with   1.  Oligometastatic rectal cancer with 2 visible liver metastases solitary lesion in the right upper lobe of the lung.-newly noted on CT chest abdomen pelvis. KRAS mutated MMR IHC normal  2.  Initially  diagnosed locally Advanced Rectal Adenocarcinoma, c(T3c, N2), mismatch repair protein normal -presented to ED on 11/29/20 with septic shock secondary to Fournier's gangrene -CT AP showed findings suspicious for necrotizing perineal soft tissue infection with irregular presacral nodule.  Rectal biopsies obtained on 11/30/20 confirmed rectal adenocarcinoma. -Baseline CEA on 12/09/20 was elevated at 16.7. -He underwent diagnostic laparoscopy and open descending loop colostomy on 12/13/20 with Dr. Harlow Asa. -Chest CT on 12/14/20 was negative for metastatic disease. -Repeat CEA on 01/06/21 showed further elevation to 52.55. -staging pelvis MRI on 01/12/21 showed: rectal adenocarcinoma T stage: At least T3c, possible involvement of prostatic apex; N stage: N2; long segment rectal tumor, involving anal sphincters. -Given locally advanced disease,he was recommended neoadjuvant Xeloda/Oxaliplatin (CAPOX)  3.  Iron deficiency anemia now resolved . Lab Results  Component Value Date   IRON 78 07/31/2021   TIBC 420 07/31/2021   IRONPCTSAT 19 07/31/2021   (Iron and TIBC)  Lab Results  Component Value Date   FERRITIN 142 07/31/2021   4. Rectal pain- nearly resolved -tramadol prn  5 Insomnia -lorazepam prn  6.  History of PERIANAL ABSCESS -resolved at this time  PLAN Patient notes no acute renal symptoms since his last clinic visit He has had ablation of his second liver metastases by interventional radiology after having one of the liver metastases resected. -He will be starting chemoradiation for his rectal primary with Xeloda from 10/27/2020. -He will have SBRT to his isolated lung lesion after completion of chemoradiation to the rectal primary. -Subsequently he will likely need repeat imaging prior to consideration of surgery to resect his rectal primary tumor by Dr. [name] Orders for Xeloda were placed and coordinated with our oral chemotherapy pharmacist.  Follow-up Return to clinic with Dr. Irene Limbo with port flush and labs in 2 weeks  The total time spent in the appointment was 25 minutes*.  All of the patient's questions were answered with apparent satisfaction. The patient knows to call the clinic with any problems, questions or concerns.   Sullivan Lone MD MS AAHIVMS [facility]  [facility] Hematology/Oncology Physician [facility]  .*Total Encounter Time as defined by the Centers for Medicare and Medicaid Services includes, in addition to the face-to-face time of a patient visit (documented in the note above) non-face-to-face time: obtaining and reviewing outside history, ordering and reviewing medications, tests or procedures, care coordination (communications with other health care professionals or caregivers) and documentation in the medical record.

## 2021-11-04 ENCOUNTER — Other Ambulatory Visit: Payer: Self-pay

## 2021-11-04 ENCOUNTER — Ambulatory Visit
Admission: RE | Admit: 2021-11-04 | Discharge: 2021-11-04 | Disposition: A | Discharge status: Home / Self Care | Payer: Medicaid Other | Source: Ambulatory Visit | Attending: Radiation Oncology | Admitting: Radiation Oncology

## 2021-11-04 DIAGNOSIS — Z510 Encounter for antineoplastic radiation therapy: Secondary | ICD-10-CM | POA: Diagnosis not present

## 2021-11-04 LAB — RAD ONC ARIA SESSION SUMMARY
Course Elapsed Days: 8
Plan Fractions Treated to Date: 6
Plan Prescribed Dose Per Fraction: 1.8 Gy
Plan Total Fractions Prescribed: 25
Plan Total Prescribed Dose: 45 Gy
Reference Point Dosage Given to Date: 10.8 Gy
Reference Point Session Dosage Given: 1.8 Gy
Session Number: 6

## 2021-11-05 ENCOUNTER — Other Ambulatory Visit: Payer: Self-pay

## 2021-11-05 ENCOUNTER — Other Ambulatory Visit: Payer: Self-pay | Admitting: Hematology

## 2021-11-05 ENCOUNTER — Other Ambulatory Visit (HOSPITAL_COMMUNITY): Payer: Self-pay

## 2021-11-05 ENCOUNTER — Ambulatory Visit
Admission: RE | Admit: 2021-11-05 | Discharge: 2021-11-05 | Disposition: A | Discharge status: Home / Self Care | Payer: Medicaid Other | Source: Ambulatory Visit | Attending: Radiation Oncology | Admitting: Radiation Oncology

## 2021-11-05 ENCOUNTER — Inpatient Hospital Stay: Payer: Medicaid Other

## 2021-11-05 ENCOUNTER — Inpatient Hospital Stay (HOSPITAL_BASED_OUTPATIENT_CLINIC_OR_DEPARTMENT_OTHER): Payer: Medicaid Other | Admitting: Hematology

## 2021-11-05 VITALS — BP 112/81 | HR 84 | Temp 97.5°F | Resp 16 | Wt 173.4 lb

## 2021-11-05 DIAGNOSIS — C787 Secondary malignant neoplasm of liver and intrahepatic bile duct: Secondary | ICD-10-CM

## 2021-11-05 DIAGNOSIS — C20 Malignant neoplasm of rectum: Secondary | ICD-10-CM

## 2021-11-05 DIAGNOSIS — Z95828 Presence of other vascular implants and grafts: Secondary | ICD-10-CM

## 2021-11-05 DIAGNOSIS — K610 Anal abscess: Secondary | ICD-10-CM | POA: Diagnosis not present

## 2021-11-05 DIAGNOSIS — Z510 Encounter for antineoplastic radiation therapy: Secondary | ICD-10-CM | POA: Diagnosis not present

## 2021-11-05 DIAGNOSIS — Z7189 Other specified counseling: Secondary | ICD-10-CM

## 2021-11-05 LAB — CMP (CANCER CENTER ONLY)
ALT: 14 U/L (ref 0–44)
AST: 10 U/L — ABNORMAL LOW (ref 15–41)
Albumin: 4 g/dL (ref 3.5–5.0)
Alkaline Phosphatase: 117 U/L (ref 38–126)
Anion gap: 7 (ref 5–15)
BUN: 15 mg/dL (ref 6–20)
CO2: 26 mmol/L (ref 22–32)
Calcium: 9.9 mg/dL (ref 8.9–10.3)
Chloride: 104 mmol/L (ref 98–111)
Creatinine: 0.7 mg/dL (ref 0.61–1.24)
GFR, Estimated: >60 mL/min (ref >60)
Glucose, Bld: 105 mg/dL — ABNORMAL HIGH (ref 70–99)
Potassium: 4.1 mmol/L (ref 3.5–5.1)
Sodium: 137 mmol/L (ref 135–145)
Total Bilirubin: 0.6 mg/dL (ref 0.3–1.2)
Total Protein: 8 g/dL (ref 6.5–8.1)

## 2021-11-05 LAB — CBC WITH DIFFERENTIAL (CANCER CENTER ONLY)
Abs Immature Granulocytes: 0.08 10*3/uL — ABNORMAL HIGH (ref 0.00–0.07)
Basophils Absolute: 0.1 10*3/uL (ref 0.0–0.1)
Basophils Relative: 1 %
Eosinophils Absolute: 0.2 10*3/uL (ref 0.0–0.5)
Eosinophils Relative: 1 %
HCT: 37.3 % — ABNORMAL LOW (ref 39.0–52.0)
Hemoglobin: 12.7 g/dL — ABNORMAL LOW (ref 13.0–17.0)
Immature Granulocytes: 1 %
Lymphocytes Relative: 13 %
Lymphs Abs: 1.4 10*3/uL (ref 0.7–4.0)
MCH: 29.2 pg (ref 26.0–34.0)
MCHC: 34 g/dL (ref 30.0–36.0)
MCV: 85.7 fL (ref 80.0–100.0)
Monocytes Absolute: 0.9 10*3/uL (ref 0.1–1.0)
Monocytes Relative: 8 %
Neutro Abs: 8.6 10*3/uL — ABNORMAL HIGH (ref 1.7–7.7)
Neutrophils Relative %: 76 %
Platelet Count: 267 10*3/uL (ref 150–400)
RBC: 4.35 MIL/uL (ref 4.22–5.81)
RDW: 14.4 % (ref 11.5–15.5)
WBC Count: 11.2 10*3/uL — ABNORMAL HIGH (ref 4.0–10.5)
nRBC: 0 % (ref 0.0–0.2)

## 2021-11-05 LAB — RAD ONC ARIA SESSION SUMMARY
Course Elapsed Days: 9
Plan Fractions Treated to Date: 7
Plan Prescribed Dose Per Fraction: 1.8 Gy
Plan Total Fractions Prescribed: 25
Plan Total Prescribed Dose: 45 Gy
Reference Point Dosage Given to Date: 12.6 Gy
Reference Point Session Dosage Given: 1.8 Gy
Session Number: 7

## 2021-11-05 LAB — CEA (IN HOUSE-CHCC): CEA (CHCC-In House): 3.62 ng/mL (ref 0.00–5.00)

## 2021-11-05 MED ORDER — LORAZEPAM 0.5 MG PO TABS: 0.5000 mg | ORAL_TABLET | Freq: Four times a day (QID) | ORAL | 0 refills | Status: DC | PRN

## 2021-11-05 MED ORDER — SODIUM CHLORIDE 0.9% FLUSH
10.0000 mL | Freq: Once | INTRAVENOUS | Status: AC
  Administered 2021-11-05: 10 mL

## 2021-11-05 MED ORDER — AMOXICILLIN-POT CLAVULANATE 875-125 MG PO TABS
1.0000 | ORAL_TABLET | Freq: Two times a day (BID) | ORAL | 0 refills | Status: DC
Start: 2021-11-05 — End: 2021-11-05

## 2021-11-05 MED ORDER — AMOXICILLIN-POT CLAVULANATE 875-125 MG PO TABS
1.0000 | ORAL_TABLET | Freq: Two times a day (BID) | ORAL | 0 refills | Status: DC
  Filled 2021-11-05: qty 20, 10d supply, fill #0

## 2021-11-05 MED ORDER — PROCHLORPERAZINE MALEATE 10 MG PO TABS: 10.0000 mg | ORAL_TABLET | Freq: Four times a day (QID) | ORAL | 1 refills | Status: DC | PRN

## 2021-11-05 MED ORDER — AMOXICILLIN-POT CLAVULANATE 875-125 MG PO TABS
1.0000 | ORAL_TABLET | Freq: Two times a day (BID) | ORAL | 0 refills | Status: DC
Start: 2021-11-05 — End: 2021-11-05
  Filled 2021-11-05: qty 20, 10d supply, fill #0

## 2021-11-05 MED ORDER — HEPARIN SOD (PORK) LOCK FLUSH 100 UNIT/ML IV SOLN
500.0000 [IU] | Freq: Once | INTRAVENOUS | Status: AC
  Administered 2021-11-05: 500 [IU]

## 2021-11-05 NOTE — Progress Notes (Signed)
[place]   [phone] [phone]   Clinic Follow up Note   Patient Care Team: [name], MD as PCP - General (Hematology) [name], MD as Consulting Physician (Urology) [name], MD as Consulting Physician (Gastroenterology) [name], MD as Consulting Physician (Colon and Rectal Surgery) [name], MD as Consulting Physician (Oncology) Royston Bake, RN as Oncology Nurse Navigator (Oncology)  Date of Service:  11/05/2021  CHIEF COMPLAINT:  Follow-up for continued evaluation and management of oligometastatic rectal cancer  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[place])  12/17/2020 Initial Diagnosis   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[place])    01/13/2021 Cancer Staging   Staging form: Colon and Rectum, AJCC 8th Edition - Clinical stage from 01/13/2021: Stage IIIB (cT3, cN2a, cM0) - Signed by [name], MD on 01/19/2021 Stage prefix: Initial diagnosis Histologic grade (G): G2 Histologic grading system: 4 grade system    01/20/2021 - 03/26/2021 Chemotherapy   Patient is on Treatment Plan : RECTAL Xelox ([place]ox) q21d x 6 cycles      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   INTERVAL HISTORY:  James Coffey is a 51 y.o. male here for continued management of his oligometastatic rectal cancer. He reports He is doing well.  He reports grade 1 fatigue following radiation.  He notes some induration and drainage around rectal area. He describes the  discharge as puss.   He notes some issues with sleeping. We discussed trying Lorazepam which he was agreeable to though he expressed that he has previously not seen improvement in sleeping with Lorazepam.   We discussed eating probiotic yogurt with antibiotic to reduce and prevent diarrhea.   He reports no prohibitive toxicities with Xeloda at 625 mg per metered square at this time.   No fevers no chills no night sweats. No rectal pain and no rectal bleeding or bleeding into his colostomy. No other acute new symptoms since we last met.  Labs done today were discussed in detail with the patient. CBC stable. CMP stable. CEA 3.62  MEDICAL HISTORY:  Past Medical History:  Diagnosis Date   Cancer ([place])    rectal cancer with mets to liver   Tobacco abuse     SURGICAL HISTORY: Past Surgical History:  Procedure Laterality Date   HERNIA REPAIR     INCISION AND DRAINAGE ABSCESS N/A 12/01/2020   Procedure: INCISION AND DRAINAGE ABSCESS;  Surgeon: Michael Boston, MD;  [facility];  Service: General;  Laterality: N/A;   INCISION AND DRAINAGE ABSCESS N/A 12/04/2020   Procedure: INCISION AND DRAINAGE FOURNIERS GANGRENE;  Surgeon: [name], MD;  [facility];  Service: General;  Laterality: N/A;   INCISION AND DRAINAGE PERIRECTAL ABSCESS N/A 11/30/2020   Procedure: INCISION AND DEBRIDEMENT OF PERIRECTAL ABSCESS AND RECTAL WALL BIOPSIES;  Surgeon: Michael Boston, MD;  [facility];  Service: General;  Laterality: N/A;   INGUINAL HERNIA REPAIR Right 2007   IR IMAGING GUIDED PORT INSERTION  05/07/2021   IR RADIOLOGIST EVAL & MGMT  08/27/2021  IR RADIOLOGIST EVAL & MGMT  09/12/2021   LAPAROSCOPIC LOOP COLOSTOMY N/A 12/13/2020   Procedure: DIAGNOSTIC LAPAROSCOPY WITH  OPEN DESCENDING LOOP COLOSTOMY;  Surgeon: Armandina Gemma, MD;  [facility];  Service: General;  Laterality: N/A;   LAPAROSCOPY N/A 08/28/2021   Procedure: LAPAROSCOPY DIAGNOSTIC;  Surgeon: Dwan Bolt, MD;   Location: [place];  Service: General;  Laterality: N/A;   OPEN PARTIAL HEPATECTOMY  Left 08/28/2021   Procedure: OPEN LEFT LATERAL HEPATECTOMY WITH INTRAOPERATIVE ULTRASOUND;  Surgeon: Dwan Bolt, MD;  Location: [place];  Service: General;  Laterality: Left;   RADIOLOGY WITH ANESTHESIA N/A 10/08/2021   Procedure: MICROWAVE ABLATION;  Surgeon: Aletta Edouard, MD;  [facility];  Service: Radiology;  Laterality: N/A;   RECTAL SURGERY      ALLERGIES:  is allergic to hydrocodone.  ROS 10 Point review of Systems was done is negative except as noted above.   MEDICATIONS:  Current Outpatient Medications  Medication Sig Dispense Refill   acetaminophen (TYLENOL) 500 MG tablet Take 2 tablets (1,000 mg total) by mouth every 8 (eight) hours as needed for mild pain or fever.     amoxicillin-clavulanate (AUGMENTIN) 875-125 MG tablet Take 1 tablet by mouth 2 (two) times daily. 20 tablet 0   capecitabine (XELODA) 500 MG tablet Take 2 tablets (1,000 mg total) by mouth 2 (two) times daily after a meal. Take within 30 minutes after meals. Take only on days of radiation Monday through Friday. 120 tablet 0   docusate sodium (COLACE) 100 MG capsule Take 1 capsule (100 mg total) by mouth 2 (two) times daily. 10 capsule 0   ibuprofen (ADVIL) 200 MG tablet Take 400 mg by mouth every 6 (six) hours as needed for moderate pain.     loperamide (IMODIUM) 2 MG capsule Take 2 tablets by mouth at onset of diarrhea, then 1 tab every 2hrs until 12hr without a BM. May take 2 tab every 4hrs at bedtime. If diarrhea recurs repeat. (Patient not taking: Reported on 09/29/2021) 30 capsule 1   LORazepam (ATIVAN) 0.5 MG tablet Take 1 tablet by mouth every 6 hours as needed for anxiety. (Patient taking differently: Take 0.5 mg by mouth at bedtime as needed for anxiety or sleep.) 30 tablet 0   oxyCODONE (OXY IR/ROXICODONE) 5 MG immediate release tablet Take 1 tablet (5 mg total) by mouth every 6 (six) hours as needed for moderate pain.  20 tablet 0   prochlorperazine (COMPAZINE) 10 MG tablet Take 1 tablet (10 mg total) by mouth every 6 (six) hours as needed for nausea or vomiting. (Patient not taking: Reported on 09/29/2021) 30 tablet 1   No current facility-administered medications for this visit.   Facility-Administered Medications Ordered in Other Visits  Medication Dose Route Frequency Provider Last Rate Last Admin   palonosetron (ALOXI) 0.25 MG/5ML injection            sodium chloride flush (NS) 0.9 % injection 10 mL  10 mL Intracatheter Once [name], MD        PHYSICAL EXAMINATION: ECOG PERFORMANCE STATUS: 1 - Symptomatic but completely ambulatory  Vital signs reviewed Wt Readings from Last 3 Encounters:  10/24/21 174 lb 8 oz (79.2 kg)  10/16/21 176 lb 4 oz (79.9 kg)  10/02/21 173 lb (78.5 kg)  NAD GENERAL:alert, in no acute distress and comfortable SKIN: no acute rashes, no significant lesions EYES: conjunctiva are pink and non-injected, sclera anicteric NECK: supple, no JVD LYMPH:  no palpable lymphadenopathy in  the cervical, axillary or inguinal regions LUNGS: clear to auscultation b/l with normal respiratory effort HEART: regular rate & rhythm ABDOMEN:  normoactive bowel sounds , non tender, not distended. Extremity: no pedal edema PSYCH: alert & oriented x 3 with fluent speech NEURO: no focal motor/sensory deficits  LABORATORY DATA:  .    Latest Ref Rng & Units 11/05/2021   12:10 PM 10/24/2021    9:17 AM 10/08/2021    7:00 AM  CBC  WBC 4.0 - 10.5 K/uL 11.2   16.1   14.2    Hemoglobin 13.0 - 17.0 g/dL 12.7   13.5   14.1    Hematocrit 39.0 - 52.0 % 37.3   40.1   42.8    Platelets 150 - 400 K/uL 267   341   245     .    Latest Ref Rng & Units 11/05/2021   12:10 PM 10/24/2021    9:17 AM 10/02/2021    9:07 AM  CMP  Glucose 70 - 99 mg/dL 105   121   130    BUN 6 - 20 mg/dL _0 Creatinine 0.61 - 1.24 mg/dL 0.70   0.76   0.75    Sodium 135 - 145 mmol/L 137   138   137     Potassium 3.5 - 5.1 mmol/L 4.1   4.0   4.0    Chloride 98 - 111 mmol/L 104   106   110    CO2 22 - 32 mmol/L _1 Calcium 8.9 - 10.3 mg/dL 9.9   9.6   9.4    Total Protein 6.5 - 8.1 g/dL 8.0   8.2   7.7    Total Bilirubin 0.3 - 1.2 mg/dL 0.6   0.9   0.8    Alkaline Phos 38 - 126 U/L 117   128   107    AST 15 - 41 U/L _2 ALT 0 - 44 U/L _3 Lab Results  Component Value Date   IRON 78 07/31/2021   TIBC 420 07/31/2021   IRONPCTSAT 19 07/31/2021   (Iron and TIBC)  Lab Results  Component Value Date   FERRITIN 142 07/31/2021     SURGICAL PATHOLOGY   THIS IS AN ADDENDUM REPORT   CASE: [id number]  PATIENT: [facility]  Surgical Pathology Report  Addendum    Reason for Addendum #1:  DNA Mismatch Repair IHC Results   Clinical History: Fournier's Gangrene; Cancer ? ([facility])      FINAL MICROSCOPIC DIAGNOSIS:   A. IRRIGATION AND DEBRIDEMENT OF SCROTUM AND PENIS:  Acute necrotizing inflammation extensively involving fascia    B. Rectal wall biopsies:  Adenocarcinoma of the rectum        RADIOGRAPHIC STUDIES: I have personally reviewed the radiological images as listed and agreed with the findings in the report. No results found.   ASSESSMENT & PLAN:   Malcomb Gangemi is a 51 y.o. male with   1.  Oligometastatic rectal cancer with 2 visible liver metastases solitary lesion in the right upper lobe of the lung.-newly noted on CT chest abdomen pelvis. KRAS mutated MMR IHC normal  2.  Initially diagnosed locally Advanced Rectal Adenocarcinoma, c(T3c, N2), mismatch repair protein normal -presented to ED on 11/29/20 with septic shock secondary to Fournier's gangrene -  CT AP showed findings suspicious for necrotizing perineal soft tissue infection with irregular presacral nodule. Rectal biopsies obtained on 11/30/20 confirmed rectal adenocarcinoma. -Baseline CEA on 12/09/20 was elevated at 16.7. -He underwent diagnostic laparoscopy and  open descending loop colostomy on 12/13/20 with Dr. Harlow Asa. -Chest CT on 12/14/20 was negative for metastatic disease. -Repeat CEA on 01/06/21 showed further elevation to 52.55. -staging pelvis MRI on 01/12/21 showed: rectal adenocarcinoma T stage: At least T3c, possible involvement of prostatic apex; N stage: N2; long segment rectal tumor, involving anal sphincters. -Given locally advanced disease,he was recommended neoadjuvant Xeloda/Oxaliplatin (CAPOX)  3.  Iron deficiency anemia now resolved . Lab Results  Component Value Date   IRON 78 07/31/2021   TIBC 420 07/31/2021   IRONPCTSAT 19 07/31/2021   (Iron and TIBC)  Lab Results  Component Value Date   FERRITIN 142 07/31/2021   4 Insomnia -lorazepam prn  5. Recurrent PERIANAL ABSCESS -resolved at this time  PLAN Patient notes no acute renal symptoms since his last clinic visit -Labs done today were reviewed in detail. CBC stable. CMP stable CEA 3.62 -He reports no prohibitive toxicities with Xeloda at 625 mg per metered square at this time. Continue Xeloda at 625 mg per metered square. Tolerating RT with grade 2 fatigue -He will have SBRT to his isolated lung lesion after completion of chemoradiation to the rectal primary. -Subsequently he will likely need repeat imaging prior to consideration of surgery to resect his rectal primary tumor by Dr. Delaney Meigs bath and Augmentin for recurrent perianal abscess.  Follow-up RTC with Dr Irene Limbo with portflush and labs in 2 weeks  The total time spent in the appointment was 30 minutes*.  All of the patient's questions were answered with apparent satisfaction. The patient knows to call the clinic with any problems, questions or concerns.   Sullivan Lone MD MS AAHIVMS [facility] Hematology/Oncology Physician [facility]  .*Total Encounter Time as defined by the Centers for Medicare and Medicaid Services includes, in addition to the face-to-face time of a patient visit  (documented in the note above) non-face-to-face time: obtaining and reviewing outside history, ordering and reviewing medications, tests or procedures, care coordination (communications with other health care professionals or caregivers) and documentation in the medical record.  I, Melene Muller, am acting as scribe for Dr. Sullivan Lone, MD. .I have reviewed the above documentation for accuracy and completeness, and I agree with the above. [name] MD

## 2021-11-06 ENCOUNTER — Other Ambulatory Visit: Payer: Self-pay

## 2021-11-06 ENCOUNTER — Ambulatory Visit
Admission: RE | Admit: 2021-11-06 | Discharge: 2021-11-06 | Disposition: A | Discharge status: Home / Self Care | Payer: Medicaid Other | Source: Ambulatory Visit | Attending: Radiation Oncology | Admitting: Radiation Oncology

## 2021-11-06 DIAGNOSIS — C20 Malignant neoplasm of rectum: Secondary | ICD-10-CM | POA: Insufficient documentation

## 2021-11-06 DIAGNOSIS — C775 Secondary and unspecified malignant neoplasm of intrapelvic lymph nodes: Secondary | ICD-10-CM | POA: Insufficient documentation

## 2021-11-06 DIAGNOSIS — R911 Solitary pulmonary nodule: Secondary | ICD-10-CM | POA: Diagnosis not present

## 2021-11-06 DIAGNOSIS — G4700 Insomnia, unspecified: Secondary | ICD-10-CM | POA: Insufficient documentation

## 2021-11-06 DIAGNOSIS — Z510 Encounter for antineoplastic radiation therapy: Secondary | ICD-10-CM | POA: Insufficient documentation

## 2021-11-06 DIAGNOSIS — C787 Secondary malignant neoplasm of liver and intrahepatic bile duct: Secondary | ICD-10-CM | POA: Insufficient documentation

## 2021-11-06 LAB — RAD ONC ARIA SESSION SUMMARY
Course Elapsed Days: 10
Plan Fractions Treated to Date: 8
Plan Prescribed Dose Per Fraction: 1.8 Gy
Plan Total Fractions Prescribed: 25
Plan Total Prescribed Dose: 45 Gy
Reference Point Dosage Given to Date: 14.4 Gy
Reference Point Session Dosage Given: 1.8 Gy
Session Number: 8

## 2021-11-07 ENCOUNTER — Other Ambulatory Visit: Payer: Self-pay

## 2021-11-07 ENCOUNTER — Ambulatory Visit
Admission: RE | Admit: 2021-11-07 | Discharge: 2021-11-07 | Disposition: A | Discharge status: Home / Self Care | Payer: Medicaid Other | Source: Ambulatory Visit | Attending: Radiation Oncology | Admitting: Radiation Oncology

## 2021-11-07 DIAGNOSIS — Z510 Encounter for antineoplastic radiation therapy: Secondary | ICD-10-CM | POA: Diagnosis not present

## 2021-11-07 LAB — RAD ONC ARIA SESSION SUMMARY
Course Elapsed Days: 11
Plan Fractions Treated to Date: 9
Plan Prescribed Dose Per Fraction: 1.8 Gy
Plan Total Fractions Prescribed: 25
Plan Total Prescribed Dose: 45 Gy
Reference Point Dosage Given to Date: 16.2 Gy
Reference Point Session Dosage Given: 1.8 Gy
Session Number: 9

## 2021-11-10 ENCOUNTER — Ambulatory Visit
Admission: RE | Admit: 2021-11-10 | Discharge: 2021-11-10 | Disposition: A | Discharge status: Home / Self Care | Payer: Medicaid Other | Source: Ambulatory Visit | Attending: Radiology | Admitting: Radiology

## 2021-11-10 ENCOUNTER — Ambulatory Visit
Admission: RE | Admit: 2021-11-10 | Discharge: 2021-11-10 | Disposition: A | Discharge status: Home / Self Care | Payer: Medicaid Other | Source: Ambulatory Visit | Attending: Radiation Oncology | Admitting: Radiation Oncology

## 2021-11-10 ENCOUNTER — Other Ambulatory Visit (HOSPITAL_COMMUNITY): Payer: Self-pay

## 2021-11-10 ENCOUNTER — Other Ambulatory Visit: Payer: Self-pay

## 2021-11-10 DIAGNOSIS — Z510 Encounter for antineoplastic radiation therapy: Secondary | ICD-10-CM | POA: Diagnosis not present

## 2021-11-10 HISTORY — PX: IR RADIOLOGIST EVAL & MGMT: IMG5224

## 2021-11-10 LAB — RAD ONC ARIA SESSION SUMMARY
Course Elapsed Days: 14
Plan Fractions Treated to Date: 10
Plan Prescribed Dose Per Fraction: 1.8 Gy
Plan Total Fractions Prescribed: 25
Plan Total Prescribed Dose: 45 Gy
Reference Point Dosage Given to Date: 18 Gy
Reference Point Session Dosage Given: 1.8 Gy
Session Number: 10

## 2021-11-10 NOTE — Progress Notes (Addendum)
Chief Complaint: Patient was consulted remotely today (TeleHealth) for follow-up after thermal ablation of the liver.  History of Present Illness: James Coffey is a 51 y.o. 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 He did well after the procedure without complaints.  Since the procedure, he has started radiation to his rectal carcinoma. He currently complains of fatigue after radiation treatments but no pain currently.  Past Medical History:  Diagnosis Date   Cancer [facility])    rectal cancer with mets to liver   Tobacco abuse     Past Surgical History:  Procedure Laterality Date   HERNIA REPAIR     INCISION AND DRAINAGE ABSCESS N/A 12/01/2020   Procedure: INCISION AND DRAINAGE ABSCESS;  Surgeon: Michael Boston, MD;  [facility];  Service: General;  Laterality: N/A;   INCISION AND DRAINAGE ABSCESS N/A 12/04/2020   Procedure: INCISION AND DRAINAGE FOURNIERS GANGRENE;  Surgeon: [name], MD;  [facility];  Service: General;  Laterality: N/A;   INCISION AND DRAINAGE PERIRECTAL ABSCESS N/A 11/30/2020   Procedure: INCISION AND DEBRIDEMENT OF PERIRECTAL ABSCESS AND RECTAL WALL BIOPSIES;  Surgeon: Michael Boston, MD;  [facility];  Service: General;  Laterality: N/A;   INGUINAL HERNIA REPAIR Right 2007   IR IMAGING GUIDED PORT INSERTION  05/07/2021   IR RADIOLOGIST EVAL & MGMT  08/27/2021   IR RADIOLOGIST EVAL & MGMT  09/12/2021   LAPAROSCOPIC LOOP COLOSTOMY N/A 12/13/2020   Procedure: DIAGNOSTIC LAPAROSCOPY WITH  OPEN DESCENDING LOOP COLOSTOMY;  Surgeon: Armandina Gemma, MD;  [facility];  Service: General;  Laterality: N/A;   LAPAROSCOPY N/A 08/28/2021   Procedure: LAPAROSCOPY DIAGNOSTIC;  Surgeon: Dwan Bolt, MD;  Location: [place];  Service: General;  Laterality: N/A;   OPEN PARTIAL HEPATECTOMY  Left 08/28/2021   Procedure: OPEN LEFT LATERAL  HEPATECTOMY WITH INTRAOPERATIVE ULTRASOUND;  Surgeon: Dwan Bolt, MD;  Location: [place];  Service: General;  Laterality: Left;   RADIOLOGY WITH ANESTHESIA N/A 10/08/2021   Procedure: MICROWAVE ABLATION;  Surgeon: Aletta Edouard, MD;  [facility];  Service: Radiology;  Laterality: N/A;   RECTAL SURGERY      Allergies: Hydrocodone  Medications: Prior to Admission medications   Medication Sig Start Date End Date Taking? Authorizing Provider  acetaminophen (TYLENOL) 500 MG tablet Take 2 tablets (1,000 mg total) by mouth every 8 (eight) hours as needed for mild pain or fever. 12/19/20   Norm Parcel, PA-C  amoxicillin-clavulanate (AUGMENTIN) 875-125 MG tablet Take 1 tablet by mouth 2 (two) times daily. 11/05/21   [name], MD  capecitabine (XELODA) 500 MG tablet Take 2 tablets (1,000 mg total) by mouth 2 (two) times daily after a meal. Take within 30 minutes after meals. Take only on days of radiation Monday through Friday. 10/24/21   [name], MD  docusate sodium (COLACE) 100 MG capsule Take 1 capsule (100 mg total) by mouth 2 (two) times daily. 08/31/21   Dwan Bolt, MD  ibuprofen (ADVIL) 200 MG tablet Take 400 mg by mouth every 6 (six) hours as needed for moderate pain.    [provider]  loperamide (IMODIUM) 2 MG capsule Take 2 tablets by mouth at onset of diarrhea, then 1 tab every 2hrs until 12hr without a BM. May take 2 tab every 4hrs at bedtime. If diarrhea recurs repeat. Patient not taking: Reported on  09/29/2021 05/07/21   [name], MD  LORazepam (ATIVAN) 0.5 MG tablet Take 1 tablet by mouth every 6 hours as needed for anxiety. 11/05/21   [name], MD  oxyCODONE (OXY IR/ROXICODONE) 5 MG immediate release tablet Take 1 tablet (5 mg total) by mouth every 6 (six) hours as needed for moderate pain. 10/08/21   Allred, Shirlyn Goltz, PA-C  prochlorperazine (COMPAZINE) 10 MG tablet Take 1 tablet (10 mg total) by mouth every 6 (six)  hours as needed for nausea or vomiting. 11/05/21   [name], MD     Family History  Problem Relation Age of Onset   CAD Neg Hx    Inflammatory bowel disease Neg Hx     Social History   Socioeconomic History   Marital status: Married    Spouse name: Not on file   Number of children: Not on file   Years of education: Not on file   Highest education level: Not on file  Occupational History   Occupation: truck driver  Tobacco Use   Smoking status: Every Day    Packs/day: 0.25    Types: Cigarettes   Smokeless tobacco: Never  Vaping Use   Vaping Use: Never used  Substance and Sexual Activity   Alcohol use: No   Drug use: No   Sexual activity: Never  Other Topics Concern   Not on file  Social History Narrative   From [place].  Came to the [place] in 2000.   Social Determinants of Health   Financial Resource Strain: Not on file  Food Insecurity: Not on file  Transportation Needs: Not on file  Physical Activity: Not on file  Stress: Not on file  Social Connections: Not on file    ECOG Status: 0 - Asymptomatic  Review of Systems  Constitutional:  Positive for fatigue.  Respiratory: Negative.    Cardiovascular: Negative.   Gastrointestinal: Negative.   Genitourinary: Negative.   Musculoskeletal: Negative.   Neurological: Negative.    Review of Systems: A 12 point ROS discussed and pertinent positives are indicated in the HPI above.  All other systems are negative.  Physical Exam No direct physical exam was performed (except for noted visual exam findings with Video Visits).   Vital Signs: There were no vitals taken for this visit.  Imaging: No results found.  Labs:  CBC: Recent Labs    10/02/21 0907 10/08/21 0700 10/24/21 0917 11/05/21 1210  WBC 13.5* 14.2* 16.1* 11.2*  HGB 13.9 14.1 13.5 12.7*  HCT 41.8 42.8 40.1 37.3*  PLT 261 245 341 267    COAGS: Recent Labs    10/02/21 0907  INR 1.0    BMP: Recent Labs    09/26/21 0913  10/02/21 0907 10/24/21 0917 11/05/21 1210  NA 139 137 138 137  K 4.0 4.0 4.0 4.1  CL 107 110 106 104  CO2 26 21* 25 26  GLUCOSE 132* 130* 121* 105*  BUN '9 15 13 15  '$ CALCIUM 9.4 9.4 9.6 9.9  CREATININE 0.74 0.75 0.76 0.70  GFRNONAA >60 >60 >60 >60    LIVER FUNCTION TESTS: Recent Labs    09/26/21 0913 10/02/21 0907 10/24/21 0917 11/05/21 1210  BILITOT 0.4 0.8 0.9 0.6  AST 12* 16 11* 10*  ALT '13 17 14 14  '$ ALKPHOS 126 107 128* 117  PROT 7.8 7.7 8.2* 8.0  ALBUMIN 3.9 3.8 4.1 4.0    Assessment and Plan:  I spoke with James Coffey over the phone. He tolerated  thermal ablation treatment to the liver well.  He has now started radiation therapy to his rectal carcinoma.  I recommended follow-up imaging of the liver in approximately 1 month.  This could be performed by either multiphase CT or MRI.  CT should be adequate for initial assessment.  Dr. Lisbeth Renshaw is also planning to perform SBRT to the RUL lung nodule after rectal radiation is completed.    Electronically Signed: Azzie Roup 11/10/2021, 1:46 PM    I spent a total of 10 Minutes in remote  clinical consultation, greater than 50% of which was counseling/coordinating care post thermal ablation of the liver.    Visit type: Audio only (telephone). Audio (no video) only due to patient's lack of internet/smartphone capability. Alternative for in-person consultation at [facility], [address], [place], [place]. This visit type was conducted due to national recommendations for restrictions regarding the COVID-19 Pandemic (e.g. social distancing).  This format is felt to be most appropriate for this patient at this time.  All issues noted in this document were discussed and addressed.

## 2021-11-11 ENCOUNTER — Other Ambulatory Visit: Payer: Self-pay

## 2021-11-11 ENCOUNTER — Ambulatory Visit
Admission: RE | Admit: 2021-11-11 | Discharge: 2021-11-11 | Disposition: A | Discharge status: Home / Self Care | Payer: Medicaid Other | Source: Ambulatory Visit | Attending: Radiation Oncology | Admitting: Radiation Oncology

## 2021-11-11 DIAGNOSIS — Z510 Encounter for antineoplastic radiation therapy: Secondary | ICD-10-CM | POA: Diagnosis not present

## 2021-11-11 LAB — RAD ONC ARIA SESSION SUMMARY
Course Elapsed Days: 15
Plan Fractions Treated to Date: 11
Plan Prescribed Dose Per Fraction: 1.8 Gy
Plan Total Fractions Prescribed: 25
Plan Total Prescribed Dose: 45 Gy
Reference Point Dosage Given to Date: 19.8 Gy
Reference Point Session Dosage Given: 1.8 Gy
Session Number: 11

## 2021-11-12 ENCOUNTER — Other Ambulatory Visit: Payer: Self-pay

## 2021-11-12 ENCOUNTER — Encounter: Payer: Self-pay | Admitting: Hematology

## 2021-11-12 ENCOUNTER — Ambulatory Visit
Admission: RE | Admit: 2021-11-12 | Discharge: 2021-11-12 | Disposition: A | Discharge status: Home / Self Care | Payer: Medicaid Other | Source: Ambulatory Visit | Attending: Radiation Oncology | Admitting: Radiation Oncology

## 2021-11-12 ENCOUNTER — Other Ambulatory Visit (HOSPITAL_COMMUNITY): Payer: Self-pay

## 2021-11-12 DIAGNOSIS — Z510 Encounter for antineoplastic radiation therapy: Secondary | ICD-10-CM | POA: Diagnosis not present

## 2021-11-12 LAB — RAD ONC ARIA SESSION SUMMARY
Course Elapsed Days: 16
Plan Fractions Treated to Date: 12
Plan Prescribed Dose Per Fraction: 1.8 Gy
Plan Total Fractions Prescribed: 25
Plan Total Prescribed Dose: 45 Gy
Reference Point Dosage Given to Date: 21.6 Gy
Reference Point Session Dosage Given: 1.8 Gy
Session Number: 12

## 2021-11-13 ENCOUNTER — Ambulatory Visit
Admission: RE | Admit: 2021-11-13 | Discharge: 2021-11-13 | Disposition: A | Discharge status: Home / Self Care | Payer: Medicaid Other | Source: Ambulatory Visit | Attending: Radiation Oncology | Admitting: Radiation Oncology

## 2021-11-13 ENCOUNTER — Other Ambulatory Visit: Payer: Self-pay

## 2021-11-13 DIAGNOSIS — Z510 Encounter for antineoplastic radiation therapy: Secondary | ICD-10-CM | POA: Diagnosis not present

## 2021-11-13 LAB — RAD ONC ARIA SESSION SUMMARY
Course Elapsed Days: 17
Plan Fractions Treated to Date: 13
Plan Prescribed Dose Per Fraction: 1.8 Gy
Plan Total Fractions Prescribed: 25
Plan Total Prescribed Dose: 45 Gy
Reference Point Dosage Given to Date: 23.4 Gy
Reference Point Session Dosage Given: 1.8 Gy
Session Number: 13

## 2021-11-14 ENCOUNTER — Ambulatory Visit
Admission: RE | Admit: 2021-11-14 | Discharge: 2021-11-14 | Disposition: A | Discharge status: Home / Self Care | Payer: Medicaid Other | Source: Ambulatory Visit | Attending: Radiation Oncology | Admitting: Radiation Oncology

## 2021-11-14 ENCOUNTER — Other Ambulatory Visit: Payer: Self-pay

## 2021-11-14 DIAGNOSIS — Z510 Encounter for antineoplastic radiation therapy: Secondary | ICD-10-CM | POA: Diagnosis not present

## 2021-11-14 LAB — RAD ONC ARIA SESSION SUMMARY
Course Elapsed Days: 18
Plan Fractions Treated to Date: 14
Plan Prescribed Dose Per Fraction: 1.8 Gy
Plan Total Fractions Prescribed: 25
Plan Total Prescribed Dose: 45 Gy
Reference Point Dosage Given to Date: 25.2 Gy
Reference Point Session Dosage Given: 1.8 Gy
Session Number: 14

## 2021-11-17 ENCOUNTER — Other Ambulatory Visit: Payer: Self-pay

## 2021-11-17 ENCOUNTER — Ambulatory Visit
Admission: RE | Admit: 2021-11-17 | Discharge: 2021-11-17 | Disposition: A | Discharge status: Home / Self Care | Payer: Medicaid Other | Source: Ambulatory Visit | Attending: Radiation Oncology | Admitting: Radiation Oncology

## 2021-11-17 ENCOUNTER — Other Ambulatory Visit: Payer: Self-pay | Admitting: Hematology

## 2021-11-17 ENCOUNTER — Other Ambulatory Visit (HOSPITAL_COMMUNITY): Payer: Self-pay

## 2021-11-17 DIAGNOSIS — C20 Malignant neoplasm of rectum: Secondary | ICD-10-CM

## 2021-11-17 DIAGNOSIS — Z510 Encounter for antineoplastic radiation therapy: Secondary | ICD-10-CM | POA: Diagnosis not present

## 2021-11-17 LAB — RAD ONC ARIA SESSION SUMMARY
Course Elapsed Days: 21
Plan Fractions Treated to Date: 15
Plan Prescribed Dose Per Fraction: 1.8 Gy
Plan Total Fractions Prescribed: 25
Plan Total Prescribed Dose: 45 Gy
Reference Point Dosage Given to Date: 27 Gy
Reference Point Session Dosage Given: 1.8 Gy
Session Number: 15

## 2021-11-17 MED ORDER — CAPECITABINE 500 MG PO TABS
625.0000 mg/m2 | ORAL_TABLET | Freq: Two times a day (BID) | ORAL | 0 refills | Status: DC
  Filled 2021-11-17: qty 80, 28d supply, fill #0

## 2021-11-18 ENCOUNTER — Other Ambulatory Visit: Payer: Self-pay

## 2021-11-18 ENCOUNTER — Ambulatory Visit
Admission: RE | Admit: 2021-11-18 | Discharge: 2021-11-18 | Disposition: A | Discharge status: Home / Self Care | Payer: Medicaid Other | Source: Ambulatory Visit | Attending: Radiation Oncology | Admitting: Radiation Oncology

## 2021-11-18 DIAGNOSIS — Z510 Encounter for antineoplastic radiation therapy: Secondary | ICD-10-CM | POA: Diagnosis not present

## 2021-11-18 LAB — RAD ONC ARIA SESSION SUMMARY
Course Elapsed Days: 22
Plan Fractions Treated to Date: 16
Plan Prescribed Dose Per Fraction: 1.8 Gy
Plan Total Fractions Prescribed: 25
Plan Total Prescribed Dose: 45 Gy
Reference Point Dosage Given to Date: 28.8 Gy
Reference Point Session Dosage Given: 1.8 Gy
Session Number: 16

## 2021-11-19 ENCOUNTER — Ambulatory Visit
Admission: RE | Admit: 2021-11-19 | Discharge: 2021-11-19 | Disposition: A | Discharge status: Home / Self Care | Payer: Medicaid Other | Source: Ambulatory Visit | Attending: Radiation Oncology | Admitting: Radiation Oncology

## 2021-11-19 ENCOUNTER — Other Ambulatory Visit: Payer: Self-pay

## 2021-11-19 DIAGNOSIS — Z510 Encounter for antineoplastic radiation therapy: Secondary | ICD-10-CM | POA: Diagnosis not present

## 2021-11-19 LAB — RAD ONC ARIA SESSION SUMMARY
Course Elapsed Days: 23
Plan Fractions Treated to Date: 17
Plan Prescribed Dose Per Fraction: 1.8 Gy
Plan Total Fractions Prescribed: 25
Plan Total Prescribed Dose: 45 Gy
Reference Point Dosage Given to Date: 30.6 Gy
Reference Point Session Dosage Given: 1.8 Gy
Session Number: 17

## 2021-11-20 ENCOUNTER — Other Ambulatory Visit: Payer: Self-pay

## 2021-11-20 ENCOUNTER — Ambulatory Visit
Admission: RE | Admit: 2021-11-20 | Discharge: 2021-11-20 | Disposition: A | Discharge status: Home / Self Care | Payer: Medicaid Other | Source: Ambulatory Visit | Attending: Radiation Oncology | Admitting: Radiation Oncology

## 2021-11-20 DIAGNOSIS — Z510 Encounter for antineoplastic radiation therapy: Secondary | ICD-10-CM | POA: Diagnosis not present

## 2021-11-20 LAB — RAD ONC ARIA SESSION SUMMARY
Course Elapsed Days: 24
Plan Fractions Treated to Date: 18
Plan Prescribed Dose Per Fraction: 1.8 Gy
Plan Total Fractions Prescribed: 25
Plan Total Prescribed Dose: 45 Gy
Reference Point Dosage Given to Date: 32.4 Gy
Reference Point Session Dosage Given: 1.8 Gy
Session Number: 18

## 2021-11-21 ENCOUNTER — Ambulatory Visit
Admission: RE | Admit: 2021-11-21 | Discharge: 2021-11-21 | Disposition: A | Discharge status: Home / Self Care | Payer: Medicaid Other | Source: Ambulatory Visit | Attending: Radiation Oncology | Admitting: Radiation Oncology

## 2021-11-21 ENCOUNTER — Other Ambulatory Visit: Payer: Self-pay

## 2021-11-21 DIAGNOSIS — Z510 Encounter for antineoplastic radiation therapy: Secondary | ICD-10-CM | POA: Diagnosis not present

## 2021-11-21 LAB — RAD ONC ARIA SESSION SUMMARY
Course Elapsed Days: 25
Plan Fractions Treated to Date: 19
Plan Prescribed Dose Per Fraction: 1.8 Gy
Plan Total Fractions Prescribed: 25
Plan Total Prescribed Dose: 45 Gy
Reference Point Dosage Given to Date: 34.2 Gy
Reference Point Session Dosage Given: 1.8 Gy
Session Number: 19

## 2021-11-24 ENCOUNTER — Other Ambulatory Visit: Payer: Self-pay

## 2021-11-24 ENCOUNTER — Ambulatory Visit
Admission: RE | Admit: 2021-11-24 | Discharge: 2021-11-24 | Disposition: A | Discharge status: Home / Self Care | Payer: Medicaid Other | Source: Ambulatory Visit | Attending: Radiation Oncology | Admitting: Radiation Oncology

## 2021-11-24 DIAGNOSIS — Z510 Encounter for antineoplastic radiation therapy: Secondary | ICD-10-CM | POA: Diagnosis not present

## 2021-11-24 LAB — RAD ONC ARIA SESSION SUMMARY
Course Elapsed Days: 28
Plan Fractions Treated to Date: 20
Plan Prescribed Dose Per Fraction: 1.8 Gy
Plan Total Fractions Prescribed: 25
Plan Total Prescribed Dose: 45 Gy
Reference Point Dosage Given to Date: 36 Gy
Reference Point Session Dosage Given: 1.8 Gy
Session Number: 20

## 2021-11-25 ENCOUNTER — Other Ambulatory Visit: Payer: Self-pay

## 2021-11-25 ENCOUNTER — Ambulatory Visit
Admission: RE | Admit: 2021-11-25 | Discharge: 2021-11-25 | Disposition: A | Discharge status: Home / Self Care | Payer: Medicaid Other | Source: Ambulatory Visit | Attending: Radiation Oncology | Admitting: Radiation Oncology

## 2021-11-25 DIAGNOSIS — Z510 Encounter for antineoplastic radiation therapy: Secondary | ICD-10-CM | POA: Diagnosis not present

## 2021-11-25 LAB — RAD ONC ARIA SESSION SUMMARY
Course Elapsed Days: 29
Plan Fractions Treated to Date: 21
Plan Prescribed Dose Per Fraction: 1.8 Gy
Plan Total Fractions Prescribed: 25
Plan Total Prescribed Dose: 45 Gy
Reference Point Dosage Given to Date: 37.8 Gy
Reference Point Session Dosage Given: 1.8 Gy
Session Number: 21

## 2021-11-26 ENCOUNTER — Other Ambulatory Visit: Payer: Self-pay | Admitting: Interventional Radiology

## 2021-11-26 ENCOUNTER — Ambulatory Visit
Admission: RE | Admit: 2021-11-26 | Discharge: 2021-11-26 | Disposition: A | Discharge status: Home / Self Care | Payer: Medicaid Other | Source: Ambulatory Visit | Attending: Radiation Oncology | Admitting: Radiation Oncology

## 2021-11-26 ENCOUNTER — Other Ambulatory Visit: Payer: Self-pay

## 2021-11-26 DIAGNOSIS — C787 Secondary malignant neoplasm of liver and intrahepatic bile duct: Secondary | ICD-10-CM

## 2021-11-26 DIAGNOSIS — Z510 Encounter for antineoplastic radiation therapy: Secondary | ICD-10-CM | POA: Diagnosis not present

## 2021-11-26 LAB — RAD ONC ARIA SESSION SUMMARY
Course Elapsed Days: 30
Plan Fractions Treated to Date: 22
Plan Prescribed Dose Per Fraction: 1.8 Gy
Plan Total Fractions Prescribed: 25
Plan Total Prescribed Dose: 45 Gy
Reference Point Dosage Given to Date: 39.6 Gy
Reference Point Session Dosage Given: 1.8 Gy
Session Number: 22

## 2021-11-27 ENCOUNTER — Ambulatory Visit
Admission: RE | Admit: 2021-11-27 | Discharge: 2021-11-27 | Disposition: A | Discharge status: Home / Self Care | Payer: Medicaid Other | Source: Ambulatory Visit | Attending: Radiation Oncology | Admitting: Radiation Oncology

## 2021-11-27 ENCOUNTER — Other Ambulatory Visit: Payer: Self-pay

## 2021-11-27 DIAGNOSIS — Z510 Encounter for antineoplastic radiation therapy: Secondary | ICD-10-CM | POA: Diagnosis not present

## 2021-11-27 DIAGNOSIS — C787 Secondary malignant neoplasm of liver and intrahepatic bile duct: Secondary | ICD-10-CM

## 2021-11-27 LAB — RAD ONC ARIA SESSION SUMMARY
Course Elapsed Days: 31
Plan Fractions Treated to Date: 23
Plan Prescribed Dose Per Fraction: 1.8 Gy
Plan Total Fractions Prescribed: 25
Plan Total Prescribed Dose: 45 Gy
Reference Point Dosage Given to Date: 41.4 Gy
Reference Point Session Dosage Given: 1.8 Gy
Session Number: 23

## 2021-11-28 ENCOUNTER — Ambulatory Visit
Admission: RE | Admit: 2021-11-28 | Discharge: 2021-11-28 | Disposition: A | Discharge status: Home / Self Care | Payer: Medicaid Other | Source: Ambulatory Visit | Attending: Radiation Oncology | Admitting: Radiation Oncology

## 2021-11-28 ENCOUNTER — Other Ambulatory Visit: Payer: Self-pay

## 2021-11-28 DIAGNOSIS — Z510 Encounter for antineoplastic radiation therapy: Secondary | ICD-10-CM | POA: Diagnosis not present

## 2021-11-28 LAB — RAD ONC ARIA SESSION SUMMARY
Course Elapsed Days: 32
Plan Fractions Treated to Date: 24
Plan Prescribed Dose Per Fraction: 1.8 Gy
Plan Total Fractions Prescribed: 25
Plan Total Prescribed Dose: 45 Gy
Reference Point Dosage Given to Date: 43.2 Gy
Reference Point Session Dosage Given: 1.8 Gy
Session Number: 24

## 2021-12-01 ENCOUNTER — Other Ambulatory Visit: Payer: Self-pay

## 2021-12-01 ENCOUNTER — Inpatient Hospital Stay: Payer: Medicaid Other

## 2021-12-01 ENCOUNTER — Ambulatory Visit
Admission: RE | Admit: 2021-12-01 | Discharge: 2021-12-01 | Disposition: A | Discharge status: Home / Self Care | Payer: Medicaid Other | Source: Ambulatory Visit | Attending: Radiation Oncology | Admitting: Radiation Oncology

## 2021-12-01 ENCOUNTER — Inpatient Hospital Stay: Payer: Medicaid Other | Attending: Hematology | Admitting: Hematology

## 2021-12-01 VITALS — BP 100/83 | HR 75 | Temp 97.5°F | Resp 18 | Wt 177.6 lb

## 2021-12-01 DIAGNOSIS — C20 Malignant neoplasm of rectum: Secondary | ICD-10-CM | POA: Insufficient documentation

## 2021-12-01 DIAGNOSIS — R911 Solitary pulmonary nodule: Secondary | ICD-10-CM | POA: Insufficient documentation

## 2021-12-01 DIAGNOSIS — C787 Secondary malignant neoplasm of liver and intrahepatic bile duct: Secondary | ICD-10-CM | POA: Insufficient documentation

## 2021-12-01 DIAGNOSIS — Z95828 Presence of other vascular implants and grafts: Secondary | ICD-10-CM

## 2021-12-01 DIAGNOSIS — Z510 Encounter for antineoplastic radiation therapy: Secondary | ICD-10-CM | POA: Diagnosis not present

## 2021-12-01 DIAGNOSIS — G4700 Insomnia, unspecified: Secondary | ICD-10-CM | POA: Insufficient documentation

## 2021-12-01 LAB — CBC WITH DIFFERENTIAL (CANCER CENTER ONLY)
Abs Immature Granulocytes: 0.07 10*3/uL (ref 0.00–0.07)
Basophils Absolute: 0.1 10*3/uL (ref 0.0–0.1)
Basophils Relative: 1 %
Eosinophils Absolute: 0.2 10*3/uL (ref 0.0–0.5)
Eosinophils Relative: 3 %
HCT: 39 % (ref 39.0–52.0)
Hemoglobin: 12.9 g/dL — ABNORMAL LOW (ref 13.0–17.0)
Immature Granulocytes: 1 %
Lymphocytes Relative: 7 %
Lymphs Abs: 0.6 10*3/uL — ABNORMAL LOW (ref 0.7–4.0)
MCH: 29.6 pg (ref 26.0–34.0)
MCHC: 33.1 g/dL (ref 30.0–36.0)
MCV: 89.4 fL (ref 80.0–100.0)
Monocytes Absolute: 0.9 10*3/uL (ref 0.1–1.0)
Monocytes Relative: 10 %
Neutro Abs: 6.7 10*3/uL (ref 1.7–7.7)
Neutrophils Relative %: 78 %
Platelet Count: 215 10*3/uL (ref 150–400)
RBC: 4.36 MIL/uL (ref 4.22–5.81)
RDW: 18.9 % — ABNORMAL HIGH (ref 11.5–15.5)
WBC Count: 8.5 10*3/uL (ref 4.0–10.5)
nRBC: 0 % (ref 0.0–0.2)

## 2021-12-01 LAB — RAD ONC ARIA SESSION SUMMARY
Course Elapsed Days: 35
Plan Fractions Treated to Date: 25
Plan Prescribed Dose Per Fraction: 1.8 Gy
Plan Total Fractions Prescribed: 25
Plan Total Prescribed Dose: 45 Gy
Reference Point Dosage Given to Date: 45 Gy
Reference Point Session Dosage Given: 1.8 Gy
Session Number: 25

## 2021-12-01 LAB — CMP (CANCER CENTER ONLY)
ALT: 15 U/L (ref 0–44)
AST: 12 U/L — ABNORMAL LOW (ref 15–41)
Albumin: 4 g/dL (ref 3.5–5.0)
Alkaline Phosphatase: 98 U/L (ref 38–126)
Anion gap: 6 (ref 5–15)
BUN: 11 mg/dL (ref 6–20)
CO2: 24 mmol/L (ref 22–32)
Calcium: 9.4 mg/dL (ref 8.9–10.3)
Chloride: 109 mmol/L (ref 98–111)
Creatinine: 0.7 mg/dL (ref 0.61–1.24)
GFR, Estimated: >60 mL/min (ref >60)
Glucose, Bld: 92 mg/dL (ref 70–99)
Potassium: 3.9 mmol/L (ref 3.5–5.1)
Sodium: 139 mmol/L (ref 135–145)
Total Bilirubin: 0.7 mg/dL (ref 0.3–1.2)
Total Protein: 7.3 g/dL (ref 6.5–8.1)

## 2021-12-01 LAB — CEA (IN HOUSE-CHCC): CEA (CHCC-In House): 4.67 ng/mL (ref 0.00–5.00)

## 2021-12-01 MED ORDER — HEPARIN SOD (PORK) LOCK FLUSH 100 UNIT/ML IV SOLN
500.0000 [IU] | Freq: Once | INTRAVENOUS | Status: AC
  Administered 2021-12-01: 500 [IU]

## 2021-12-01 MED ORDER — SODIUM CHLORIDE 0.9% FLUSH
10.0000 mL | Freq: Once | INTRAVENOUS | Status: AC
  Administered 2021-12-01: 10 mL

## 2021-12-01 NOTE — Progress Notes (Signed)
[place]   [phone] [phone]   Clinic Follow up Note   Patient Care Team: [name], MD as PCP - General (Hematology) [name], MD as Consulting Physician (Urology) [name], MD as Consulting Physician (Gastroenterology) [name], MD as Consulting Physician (Colon and Rectal Surgery) [name], MD as Consulting Physician (Oncology) Royston Bake, RN as Oncology Nurse Navigator (Oncology)  Date of Service:  12/01/2021  CHIEF COMPLAINT:  Follow-up for continued evaluation and management of oligometastatic rectal cancer  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[place])  12/17/2020 Initial Diagnosis   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[place])   01/13/2021 Cancer Staging   Staging form: Colon and Rectum, AJCC 8th Edition - Clinical stage from 01/13/2021: Stage IIIB (cT3, cN2a, cM0) - Signed by [name], MD on 01/19/2021 Stage prefix: Initial diagnosis Histologic grade (G): G2 Histologic grading system: 4 grade system   01/20/2021 - 03/26/2021 Chemotherapy   Patient is on Treatment Plan : RECTAL Xelox ([place]) q21d x 6 cycles     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 ([place])  05/07/2021 Initial Diagnosis   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 ([place])   05/22/2021 -  Chemotherapy   Patient is on Treatment Plan : PANCREAS Modified FOLFIRINOX q14d x 12 cycles      Previous treatment -4 cycles of XELOX (August to October 2022) -4 cycles of FOLFIRINOX (December 2022 through February 2023) -FOLFIRI x 2 cycles --pending surgery.   INTERVAL HISTORY:   James Coffey is a 51 y.o. male here for continued management of his oligometastatic rectal cancer. He reports He is doing well with no new symptoms or concerns.  He reports he is eating well.  Previous induration and discharge in rectal area has resolved.  He  notes persistent grade 1 fatigue.  He reports no prohibitive toxicities with Xeloda at 625 mg per metered square at this time.   He has a f/u with Dr. [name] July 55DJ for continued evaluation and management of oligometastatic rectal cancer.   No fevers no chills no night sweats. No rectal pain and no rectal bleeding or bleeding into his colostomy. No other acute new symptoms since we last met.  Labs done today were discussed in detail with the patient.  MEDICAL HISTORY:  Past Medical History:  Diagnosis Date   Cancer ([place])    rectal cancer with mets to liver   Tobacco abuse     SURGICAL HISTORY: Past Surgical History:  Procedure Laterality Date   HERNIA REPAIR     INCISION AND DRAINAGE ABSCESS N/A 12/01/2020   Procedure: INCISION AND DRAINAGE ABSCESS;  Surgeon: Michael Boston, MD;  [facility];  Service: General;  Laterality: N/A;   INCISION AND DRAINAGE ABSCESS N/A 12/04/2020   Procedure: INCISION AND DRAINAGE FOURNIERS GANGRENE;  Surgeon: [name], MD;  [facility];  Service: General;  Laterality: N/A;   INCISION AND DRAINAGE PERIRECTAL ABSCESS N/A 11/30/2020   Procedure: INCISION AND DEBRIDEMENT OF PERIRECTAL ABSCESS AND RECTAL WALL BIOPSIES;  Surgeon: Michael Boston, MD;  [facility];  Service: General;  Laterality: N/A;   INGUINAL HERNIA REPAIR Right 2007   IR IMAGING GUIDED PORT INSERTION  05/07/2021   IR RADIOLOGIST EVAL & MGMT  08/27/2021   IR RADIOLOGIST EVAL & MGMT  09/12/2021   IR RADIOLOGIST EVAL & MGMT  11/10/2021   LAPAROSCOPIC LOOP COLOSTOMY N/A 12/13/2020   Procedure: DIAGNOSTIC LAPAROSCOPY WITH  OPEN DESCENDING LOOP COLOSTOMY;  Surgeon: Armandina Gemma, MD;  [facility];  Service: General;  Laterality: N/A;   LAPAROSCOPY N/A 08/28/2021   Procedure: LAPAROSCOPY DIAGNOSTIC;  Surgeon: Dwan Bolt, MD;  Location: [facility];  Service: General;  Laterality: N/A;   OPEN PARTIAL HEPATECTOMY  Left 08/28/2021   Procedure: OPEN LEFT LATERAL  HEPATECTOMY WITH INTRAOPERATIVE ULTRASOUND;  Surgeon: Dwan Bolt, MD;  Location: [facility];  Service: General;  Laterality: Left;   RADIOLOGY WITH ANESTHESIA N/A 10/08/2021   Procedure: MICROWAVE ABLATION;  Surgeon: Aletta Edouard, MD;  [facility];  Service: Radiology;  Laterality: N/A;   RECTAL SURGERY      ALLERGIES:  is allergic to hydrocodone.  ROS 10 Point review of Systems was done is negative except as noted above.   MEDICATIONS:  Current Outpatient Medications  Medication Sig Dispense Refill   acetaminophen (TYLENOL) 500 MG tablet Take 2 tablets (1,000 mg total) by mouth every 8 (eight) hours as needed for mild pain or fever.     amoxicillin-clavulanate (AUGMENTIN) 875-125 MG tablet Take 1 tablet by mouth 2 (two) times daily. 20 tablet 0   capecitabine (XELODA) 500 MG tablet Take 2 tablets (1,000 mg total) by mouth 2 (two) times daily after a meal. Take within 30 minutes after meals. Take only on days of radiation Monday through Friday. 120 tablet 0   docusate sodium (COLACE) 100 MG capsule Take 1 capsule (100 mg total) by mouth 2 (two) times daily. 10 capsule 0   ibuprofen (ADVIL) 200 MG tablet Take 400 mg by mouth every 6 (six) hours as needed for moderate pain.     loperamide (IMODIUM) 2 MG capsule Take 2 tablets by mouth at onset of diarrhea, then 1 tab every 2hrs until 12hr without a BM. May take 2 tab every 4hrs at bedtime. If diarrhea recurs repeat. (Patient not taking: Reported on 09/29/2021) 30 capsule 1   LORazepam (ATIVAN) 0.5 MG tablet Take 1 tablet by mouth every 6 hours as needed for anxiety. 30 tablet 0   oxyCODONE (OXY IR/ROXICODONE) 5 MG immediate release tablet Take 1 tablet (5 mg total) by mouth every 6 (six) hours as needed for moderate pain. 20 tablet 0   prochlorperazine (COMPAZINE) 10 MG tablet Take 1 tablet (10 mg total) by mouth every 6 (six) hours as needed for nausea or vomiting. 30 tablet 1   No current facility-administered medications for this  visit.   Facility-Administered Medications Ordered in Other Visits  Medication Dose Route Frequency Provider Last Rate Last Admin   palonosetron (ALOXI) 0.25 MG/5ML injection            sodium chloride flush (NS) 0.9 % injection 10 mL  10 mL Intracatheter Once [name], MD        PHYSICAL EXAMINATION: ECOG PERFORMANCE STATUS: 1 - Symptomatic but completely ambulatory  Vital signs reviewed Wt Readings from Last 3 Encounters:  12/01/21 177 lb 9.6 oz (80.6 kg)  11/05/21 173 lb 6.4 oz (78.7 kg)  10/24/21 174 lb 8 oz (79.2 kg)  NAD GENERAL:alert, in no acute distress and comfortable SKIN: no acute rashes, no significant lesions EYES: conjunctiva are pink and non-injected, sclera anicteric NECK: supple, no JVD LYMPH:  no palpable lymphadenopathy in the cervical, axillary or inguinal regions LUNGS: clear to auscultation b/l with normal respiratory effort HEART: regular rate & rhythm ABDOMEN:  normoactive bowel sounds , non tender, not distended. Extremity: no pedal edema PSYCH: alert & oriented x 3 with  fluent speech NEURO: no focal motor/sensory deficits  LABORATORY DATA:  .    Latest Ref Rng & Units 12/01/2021   11:31 AM 11/05/2021   12:10 PM 10/24/2021    9:17 AM  CBC  WBC 4.0 - 10.5 K/uL 8.5  11.2  16.1   Hemoglobin 13.0 - 17.0 g/dL 12.9  12.7  13.5   Hematocrit 39.0 - 52.0 % 39.0  37.3  40.1   Platelets 150 - 400 K/uL 215  267  341    .    Latest Ref Rng & Units 12/01/2021   11:31 AM 11/05/2021   12:10 PM 10/24/2021    9:17 AM  CMP  Glucose 70 - 99 mg/dL 92  105  121   BUN 6 - 20 mg/dL '11  15  13   ' Creatinine 0.61 - 1.24 mg/dL 0.70  0.70  0.76   Sodium 135 - 145 mmol/L 139  137  138   Potassium 3.5 - 5.1 mmol/L 3.9  4.1  4.0   Chloride 98 - 111 mmol/L 109  104  106   CO2 22 - 32 mmol/L '24  26  25   ' Calcium 8.9 - 10.3 mg/dL 9.4  9.9  9.6   Total Protein 6.5 - 8.1 g/dL 7.3  8.0  8.2   Total Bilirubin 0.3 - 1.2 mg/dL 0.7  0.6  0.9   Alkaline Phos 38 - 126  U/L 98  117  128   AST 15 - 41 U/L '12  10  11   ' ALT 0 - 44 U/L '15  14  14     ' Lab Results  Component Value Date   IRON 78 07/31/2021   TIBC 420 07/31/2021   IRONPCTSAT 19 07/31/2021   (Iron and TIBC)  Lab Results  Component Value Date   FERRITIN 142 07/31/2021     SURGICAL PATHOLOGY   THIS IS AN ADDENDUM REPORT   CASE: [id number]  PATIENT: [facility]  Surgical Pathology Report  Addendum    Reason for Addendum #1:  DNA Mismatch Repair IHC Results   Clinical History: Fournier's Gangrene; Cancer ? ([facility])      FINAL MICROSCOPIC DIAGNOSIS:   A. IRRIGATION AND DEBRIDEMENT OF SCROTUM AND PENIS:  Acute necrotizing inflammation extensively involving fascia    B. Rectal wall biopsies:  Adenocarcinoma of the rectum        RADIOGRAPHIC STUDIES: I have personally reviewed the radiological images as listed and agreed with the findings in the report. No results found.   ASSESSMENT & PLAN:   James Coffey is a 51 y.o. male with   1.  Oligometastatic rectal cancer with 2 visible liver metastases solitary lesion in the right upper lobe of the lung.-newly noted on CT chest abdomen pelvis. KRAS mutated MMR IHC normal  2.  Initially diagnosed locally Advanced Rectal Adenocarcinoma, c(T3c, N2), mismatch repair protein normal -presented to ED on 11/29/20 with septic shock secondary to Fournier's gangrene -CT AP showed findings suspicious for necrotizing perineal soft tissue infection with irregular presacral nodule. Rectal biopsies obtained on 11/30/20 confirmed rectal adenocarcinoma. -Baseline CEA on 12/09/20 was elevated at 16.7. -He underwent diagnostic laparoscopy and open descending loop colostomy on 12/13/20 with Dr. Harlow Asa. -Chest CT on 12/14/20 was negative for metastatic disease. -Repeat CEA on 01/06/21 showed further elevation to 52.55. -staging pelvis MRI on 01/12/21 showed: rectal adenocarcinoma T stage: At least T3c, possible involvement of prostatic apex; N stage: N2;  long segment rectal tumor, involving anal sphincters. -s/p neoadjuvant  FOLFOX chemotherapy S/p resection of liver met and thermal ablation of 2nd liver met Currently completing Chemo radiation of rectal mass Plan for SBRT of isolated lung lesion.  3.  Iron deficiency anemia now resolved . Lab Results  Component Value Date   IRON 78 07/31/2021   TIBC 420 07/31/2021   IRONPCTSAT 19 07/31/2021   (Iron and TIBC)  Lab Results  Component Value Date   FERRITIN 142 07/31/2021   4 Insomnia -lorazepam prn  5. Recurrent PERIANAL ABSCESS -resolved at this time  PLAN Patient notes no acute renal symptoms since his last clinic visit -Labs done today were reviewed in detail. CBC stable. CMP stable CEA 4.67 -He reports no prohibitive toxicities with Xeloda at 625 mg per metered square at this time. He will stop this once his RT to rectum has completed. Tolerating RT with grade 2 fatigue -He will have SBRT to his isolated lung lesion after completion of chemoradiation to the rectal primary. -Subsequently he will likely need repeat imaging prior to consideration of surgery to resect his rectal primary tumor by Dr. [name] -will deferred choice of imaging to his surgeon. -He has a f/u with Dr. [name] July 51YO for continued evaluation and management of oligometastatic rectal cancer.   Follow-up RTC with Dr Irene Limbo in 4-6 weeks with port flush and labs  The total time spent in the appointment was 30 minutes*.  All of the patient's questions were answered with apparent satisfaction. The patient knows to call the clinic with any problems, questions or concerns.   Sullivan Lone MD MS AAHIVMS [facility] Hematology/Oncology Physician [facility]  .*Total Encounter Time as defined by the Centers for Medicare and Medicaid Services includes, in addition to the face-to-face time of a patient visit (documented in the note above) non-face-to-face time: obtaining and reviewing outside  history, ordering and reviewing medications, tests or procedures, care coordination (communications with other health care professionals or caregivers) and documentation in the medical record.  I, Melene Muller, am acting as scribe for Dr. Sullivan Lone, MD.  .I have reviewed the above documentation for accuracy and completeness, and I agree with the above. [name] MD

## 2021-12-02 ENCOUNTER — Other Ambulatory Visit: Payer: Self-pay

## 2021-12-02 ENCOUNTER — Ambulatory Visit
Admission: RE | Admit: 2021-12-02 | Discharge: 2021-12-02 | Disposition: A | Discharge status: Home / Self Care | Payer: Medicaid Other | Source: Ambulatory Visit | Attending: Radiation Oncology | Admitting: Radiation Oncology

## 2021-12-02 DIAGNOSIS — Z510 Encounter for antineoplastic radiation therapy: Secondary | ICD-10-CM | POA: Diagnosis not present

## 2021-12-02 LAB — RAD ONC ARIA SESSION SUMMARY
Course Elapsed Days: 36
Plan Fractions Treated to Date: 1
Plan Prescribed Dose Per Fraction: 1.8 Gy
Plan Total Fractions Prescribed: 3
Plan Total Prescribed Dose: 5.4 Gy
Reference Point Dosage Given to Date: 46.8 Gy
Reference Point Session Dosage Given: 1.8 Gy
Session Number: 26

## 2021-12-03 ENCOUNTER — Ambulatory Visit
Admission: RE | Admit: 2021-12-03 | Discharge: 2021-12-03 | Disposition: A | Discharge status: Home / Self Care | Payer: Medicaid Other | Source: Ambulatory Visit | Attending: Radiation Oncology | Admitting: Radiation Oncology

## 2021-12-03 ENCOUNTER — Other Ambulatory Visit: Payer: Self-pay

## 2021-12-03 ENCOUNTER — Telehealth: Payer: Self-pay | Admitting: *Deleted

## 2021-12-03 DIAGNOSIS — Z510 Encounter for antineoplastic radiation therapy: Secondary | ICD-10-CM | POA: Diagnosis not present

## 2021-12-03 LAB — RAD ONC ARIA SESSION SUMMARY
Course Elapsed Days: 37
Plan Fractions Treated to Date: 2
Plan Prescribed Dose Per Fraction: 1.8 Gy
Plan Total Fractions Prescribed: 3
Plan Total Prescribed Dose: 5.4 Gy
Reference Point Dosage Given to Date: 48.6 Gy
Reference Point Session Dosage Given: 1.8 Gy
Session Number: 27

## 2021-12-03 NOTE — Telephone Encounter (Signed)
Left a voicemail asking the patient to come in at 10:15 tomorrow to see Dr. Lisbeth Renshaw prior to his final radiation treatment.  Call back number [phone]) left for questions or concerns.  Gloriajean Dell. Leonie Green, BSN

## 2021-12-04 ENCOUNTER — Ambulatory Visit
Admission: RE | Admit: 2021-12-04 | Discharge: 2021-12-04 | Disposition: A | Discharge status: Home / Self Care | Payer: Medicaid Other | Source: Ambulatory Visit | Attending: Radiation Oncology | Admitting: Radiation Oncology

## 2021-12-04 ENCOUNTER — Ambulatory Visit: Payer: Medicaid Other

## 2021-12-04 ENCOUNTER — Encounter: Payer: Self-pay | Admitting: Radiation Oncology

## 2021-12-04 ENCOUNTER — Other Ambulatory Visit: Payer: Self-pay

## 2021-12-04 DIAGNOSIS — Z510 Encounter for antineoplastic radiation therapy: Secondary | ICD-10-CM | POA: Diagnosis not present

## 2021-12-04 LAB — RAD ONC ARIA SESSION SUMMARY
Course Elapsed Days: 38
Plan Fractions Treated to Date: 3
Plan Prescribed Dose Per Fraction: 1.8 Gy
Plan Total Fractions Prescribed: 3
Plan Total Prescribed Dose: 5.4 Gy
Reference Point Dosage Given to Date: 50.4 Gy
Reference Point Session Dosage Given: 1.8 Gy
Session Number: 28

## 2021-12-07 ENCOUNTER — Encounter: Payer: Self-pay | Admitting: Hematology

## 2021-12-08 ENCOUNTER — Telehealth: Payer: Self-pay | Admitting: Hematology

## 2021-12-08 NOTE — Telephone Encounter (Signed)
.  Called patient to schedule appointment per 6/30 inbasket, patient is aware of date and time.

## 2021-12-10 ENCOUNTER — Other Ambulatory Visit: Payer: Self-pay

## 2021-12-10 ENCOUNTER — Ambulatory Visit
Admission: RE | Admit: 2021-12-10 | Discharge: 2021-12-10 | Disposition: A | Discharge status: Home / Self Care | Payer: Medicaid Other | Source: Ambulatory Visit | Attending: Radiation Oncology | Admitting: Radiation Oncology

## 2021-12-10 DIAGNOSIS — C20 Malignant neoplasm of rectum: Secondary | ICD-10-CM | POA: Insufficient documentation

## 2021-12-10 DIAGNOSIS — C775 Secondary and unspecified malignant neoplasm of intrapelvic lymph nodes: Secondary | ICD-10-CM | POA: Insufficient documentation

## 2021-12-10 DIAGNOSIS — Z510 Encounter for antineoplastic radiation therapy: Secondary | ICD-10-CM | POA: Diagnosis not present

## 2021-12-10 DIAGNOSIS — C787 Secondary malignant neoplasm of liver and intrahepatic bile duct: Secondary | ICD-10-CM | POA: Diagnosis not present

## 2021-12-12 ENCOUNTER — Other Ambulatory Visit (HOSPITAL_COMMUNITY): Payer: Self-pay

## 2021-12-15 ENCOUNTER — Encounter: Payer: Self-pay | Admitting: Hematology

## 2021-12-15 ENCOUNTER — Other Ambulatory Visit (HOSPITAL_COMMUNITY): Payer: Self-pay

## 2021-12-15 DIAGNOSIS — Z510 Encounter for antineoplastic radiation therapy: Secondary | ICD-10-CM | POA: Diagnosis not present

## 2021-12-15 NOTE — Progress Notes (Signed)
                                                                                                                                                             Patient Name: James Coffey MRN: [id number] DOB: 1970/11/18 Referring Physician: Sullivan Lone (Profile Not Attached) Date of Service: 12/04/2021 [facility] [place], [place]                                                        End Of Treatment Note  Diagnoses: C20-Malignant neoplasm of rectum  Cancer Staging: Oligometastatic cT3N2,M1 adenocarcinoma of the rectum  Intent: Curative  Radiation Treatment Dates: 10/27/2021 through 12/04/2021 Site Technique Total Dose (Gy) Dose per Fx (Gy) Completed Fx Beam Energies  Rectum: Rectum 3D 45/45 1.8 25/25 10X, 15X  Rectum: Rectum_Bst 3D 5.4/5.4 1.8 3/3 10X, 15X   Narrative: The patient tolerated radiation therapy relatively well. He will also proceed with SBRT to his lung nodule in the RUL.  Plan: The patient will receive a call in about one month from the radiation oncology department. She will continue follow up with Dr. Irene Limbo as well.  ________________________________________________    Carola Rhine, [facility]

## 2021-12-19 ENCOUNTER — Other Ambulatory Visit (HOSPITAL_COMMUNITY): Payer: Self-pay

## 2021-12-22 ENCOUNTER — Ambulatory Visit: Payer: Self-pay | Admitting: General Surgery

## 2021-12-22 NOTE — H&P (Signed)
PROVIDER:  Monico Blitz, MD  MRN: [id number] DOB: 08-14-1970 DATE OF ENCOUNTER: 12/22/2021 Interval History:   51 year old male status post hospitalization last year due to necrotizing fasciitis of the perineum.  He underwent a diverting colostomy due to the extensive nature of his wounds on 12/13/20.  He underwent multiple debridements of his perineal wounds during the month of June.  During 1 of these debridements, he was noted to have a distal rectal mass.  Biopsy of this showed adenocarcinoma.  GI was consulted and ultimately diagnosed a rectal cancer.  This was determined to be stage 4 due to liver met.  Oncology was consulted and started chemotherapy.  He underwent a partial left hepatectomy in March 2023 by Dr. [name].  He then underwent percutaneous ablation of a right liver tumor in early May.  He is recovering from this.  He also finished radiation therapy on December 04, 2021.  He is here to discuss excision of his primary tumor.  This was done with his daughter interpreting for [place] GI: [place] [facility]: [place] [facility] Lim[facility]  Past Medical History:  Diagnosis Date   Cancer [facility])    rectal cancer with mets to liver   Tobacco abuse    Past Surgical History:  Procedure Laterality Date   HERNIA REPAIR     INCISION AND DRAINAGE ABSCESS N/A 12/01/2020   Procedure: INCISION AND DRAINAGE ABSCESS;  Surgeon: Michael Boston, MD;  [facility];  Service: General;  Laterality: N/A;   INCISION AND DRAINAGE ABSCESS N/A 12/04/2020   Procedure: INCISION AND DRAINAGE FOURNIERS GANGRENE;  Surgeon: [name], MD;  [facility];  Service: General;  Laterality: N/A;   INCISION AND DRAINAGE PERIRECTAL ABSCESS N/A 11/30/2020   Procedure: INCISION AND DEBRIDEMENT OF PERIRECTAL ABSCESS AND RECTAL WALL BIOPSIES;  Surgeon: Michael Boston, MD;  [facility];  Service: General;  Laterality: N/A;   INGUINAL HERNIA REPAIR Right 2007   IR IMAGING GUIDED PORT INSERTION  05/07/2021   IR RADIOLOGIST EVAL  & MGMT  08/27/2021   IR RADIOLOGIST EVAL & MGMT  09/12/2021   IR RADIOLOGIST EVAL & MGMT  11/10/2021   LAPAROSCOPIC LOOP COLOSTOMY N/A 12/13/2020   Procedure: DIAGNOSTIC LAPAROSCOPY WITH  OPEN DESCENDING LOOP COLOSTOMY;  Surgeon: Armandina Gemma, MD;  [facility];  Service: General;  Laterality: N/A;   LAPAROSCOPY N/A 08/28/2021   Procedure: LAPAROSCOPY DIAGNOSTIC;  Surgeon: Dwan Bolt, MD;  Location: [place];  Service: General;  Laterality: N/A;   OPEN PARTIAL HEPATECTOMY  Left 08/28/2021   Procedure: OPEN LEFT LATERAL HEPATECTOMY WITH INTRAOPERATIVE ULTRASOUND;  Surgeon: Dwan Bolt, MD;  Location: [place];  Service: General;  Laterality: Left;   RADIOLOGY WITH ANESTHESIA N/A 10/08/2021   Procedure: MICROWAVE ABLATION;  Surgeon: Aletta Edouard, MD;  [facility];  Service: Radiology;  Laterality: N/A;   RECTAL SURGERY     Family History  Problem Relation Age of Onset   CAD Neg Hx    Inflammatory bowel disease Neg Hx    Social History   Socioeconomic History   Marital status: Married    Spouse name: Not on file   Number of children: Not on file   Years of education: Not on file   Highest education level: Not on file  Occupational History   Occupation: truck driver  Tobacco Use   Smoking status: Every Day    Packs/day: 0.25    Types: Cigarettes   Smokeless tobacco: Never  Vaping Use   Vaping Use:  Never used  Substance and Sexual Activity   Alcohol use: No   Drug use: No   Sexual activity: Never  Other Topics Concern   Not on file  Social History Narrative   From [place].  Came to the [place] in 2000.   Social Determinants of Health   Financial Resource Strain: Not on file  Food Insecurity: Not on file  Transportation Needs: Not on file  Physical Activity: Not on file  Stress: Not on file  Social Connections: Not on file  Intimate Partner Violence: Not on file   Review of Systems -   Negative except as stated above   Current Outpatient Medications:     acetaminophen (TYLENOL) 500 MG tablet, Take 2 tablets (1,000 mg total) by mouth every 8 (eight) hours as needed for mild pain or fever., Disp: , Rfl:    amoxicillin-clavulanate (AUGMENTIN) 875-125 MG tablet, Take 1 tablet by mouth 2 (two) times daily., Disp: 20 tablet, Rfl: 0   docusate sodium (COLACE) 100 MG capsule, Take 1 capsule (100 mg total) by mouth 2 (two) times daily., Disp: 10 capsule, Rfl: 0   ibuprofen (ADVIL) 200 MG tablet, Take 400 mg by mouth every 6 (six) hours as needed for moderate pain., Disp: , Rfl:    loperamide (IMODIUM) 2 MG capsule, Take 2 tablets by mouth at onset of diarrhea, then 1 tab every 2hrs until 12hr without a BM. May take 2 tab every 4hrs at bedtime. If diarrhea recurs repeat. (Patient not taking: Reported on 09/29/2021), Disp: 30 capsule, Rfl: 1   LORazepam (ATIVAN) 0.5 MG tablet, Take 1 tablet by mouth every 6 hours as needed for anxiety., Disp: 30 tablet, Rfl: 0   oxyCODONE (OXY IR/ROXICODONE) 5 MG immediate release tablet, Take 1 tablet (5 mg total) by mouth every 6 (six) hours as needed for moderate pain., Disp: 20 tablet, Rfl: 0   prochlorperazine (COMPAZINE) 10 MG tablet, Take 1 tablet (10 mg total) by mouth every 6 (six) hours as needed for nausea or vomiting., Disp: 30 tablet, Rfl: 1 No current facility-administered medications for this visit.  Facility-Administered Medications Ordered in Other Visits:    palonosetron (ALOXI) 0.25 MG/5ML injection, , , ,    sodium chloride flush (NS) 0.9 % injection 10 mL, 10 mL, Intracatheter, Once, Irene Limbo, Cloria [place], MD   Allergies  Allergen Reactions   Hydrocodone Itching    Physical Examination:   Gen: NAD CV: RRR Lungs: CTA Abd: soft Ostomy pink and viable  Most recent abdominal CT in November 2022 shows decrease in rectal tumor and soft adrenal nodule in the presacral space.  MRI of the pelvis completed in February 2023 shows considerable decrease in bulkiness of low rectal tumor with fibrosis.  Levator  ani involvement was noted along with involvement of the anal sphincter.  Last CEA was 4.6. Assessment and Plan:   James Coffey is a 51 y.o. male who underwent multiple debridements due to peroneal gangrene.  He was diagnosed with stage IV rectal cancer.  He has completed a hepatectomy and liver ablation in order to eradicate his known metastatic disease.  He has now completed radiation therapy to his rectum.  In my shows some invasion into the proximal sphincter complex.  I have recommended a robotic assisted abdominal perineal resection.  Patient also has a large parastomal hernia.  We discussed revising his ostomy and reinforcing his closure with absorbable mesh to hopefully help prevent further hernia recurrences.  Patient is agreeable to this.  The surgery  and anatomy were described to the patient as well as the risks of surgery and the possible complications.  These include: Bleeding, deep abdominal infections and possible wound complications such as hernia and infection, damage to adjacent structures, leak of surgical connections, which can lead to other surgeries and possibly an ostomy, possible need for other procedures, such as abscess drains in radiology, possible prolonged hospital stay, possible diarrhea from removal of part of the colon, possible constipation from narcotics, possible bowel, bladder or sexual dysfunction if having rectal surgery, prolonged fatigue/weakness or appetite loss, possible early recurrence of of disease, possible complications of their medical problems such as heart disease or arrhythmias or lung problems, death (less than 1%). I believe the patient understands and wishes to proceed with the surgery.  Diagnoses and all orders for this visit:  Rectal cancer (CMS-HCC)   Rosario Adie, MD  Colorectal and [place] [facility]

## 2021-12-23 ENCOUNTER — Ambulatory Visit
Admission: RE | Admit: 2021-12-23 | Discharge: 2021-12-23 | Disposition: A | Discharge status: Home / Self Care | Payer: Medicaid Other | Source: Ambulatory Visit | Attending: Radiation Oncology | Admitting: Radiation Oncology

## 2021-12-23 ENCOUNTER — Other Ambulatory Visit: Payer: Self-pay

## 2021-12-23 DIAGNOSIS — Z510 Encounter for antineoplastic radiation therapy: Secondary | ICD-10-CM | POA: Diagnosis not present

## 2021-12-23 LAB — RAD ONC ARIA SESSION SUMMARY
Course Elapsed Days: 57
Plan Fractions Treated to Date: 1
Plan Prescribed Dose Per Fraction: 18 Gy
Plan Total Fractions Prescribed: 3
Plan Total Prescribed Dose: 54 Gy
Reference Point Dosage Given to Date: 18 Gy
Reference Point Session Dosage Given: 18 Gy
Session Number: 29

## 2021-12-24 ENCOUNTER — Ambulatory Visit: Payer: Medicaid Other | Admitting: Hematology

## 2021-12-24 ENCOUNTER — Ambulatory Visit: Payer: Medicaid Other

## 2021-12-24 ENCOUNTER — Other Ambulatory Visit: Payer: Medicaid Other

## 2021-12-25 ENCOUNTER — Ambulatory Visit
Admission: RE | Admit: 2021-12-25 | Discharge: 2021-12-25 | Disposition: A | Discharge status: Home / Self Care | Payer: Medicaid Other | Source: Ambulatory Visit | Attending: Radiation Oncology | Admitting: Radiation Oncology

## 2021-12-25 ENCOUNTER — Other Ambulatory Visit: Payer: Self-pay

## 2021-12-25 DIAGNOSIS — Z510 Encounter for antineoplastic radiation therapy: Secondary | ICD-10-CM | POA: Diagnosis not present

## 2021-12-25 LAB — RAD ONC ARIA SESSION SUMMARY
Course Elapsed Days: 59
Plan Fractions Treated to Date: 2
Plan Prescribed Dose Per Fraction: 18 Gy
Plan Total Fractions Prescribed: 3
Plan Total Prescribed Dose: 54 Gy
Reference Point Dosage Given to Date: 36 Gy
Reference Point Session Dosage Given: 18 Gy
Session Number: 30

## 2021-12-29 ENCOUNTER — Other Ambulatory Visit: Payer: Self-pay

## 2021-12-29 ENCOUNTER — Ambulatory Visit (HOSPITAL_COMMUNITY)
Admission: RE | Admit: 2021-12-29 | Discharge: 2021-12-29 | Disposition: A | Discharge status: Home / Self Care | Payer: Medicaid Other | Source: Ambulatory Visit | Attending: Interventional Radiology | Admitting: Interventional Radiology

## 2021-12-29 DIAGNOSIS — C20 Malignant neoplasm of rectum: Secondary | ICD-10-CM | POA: Diagnosis present

## 2021-12-29 DIAGNOSIS — C787 Secondary malignant neoplasm of liver and intrahepatic bile duct: Secondary | ICD-10-CM | POA: Diagnosis present

## 2021-12-29 MED ORDER — SODIUM CHLORIDE (PF) 0.9 % IJ SOLN
INTRAMUSCULAR | Status: AC
  Filled 2021-12-29: qty 50

## 2021-12-29 MED ORDER — HEPARIN SOD (PORK) LOCK FLUSH 100 UNIT/ML IV SOLN
INTRAVENOUS | Status: AC
  Filled 2021-12-29: qty 5

## 2021-12-29 MED ORDER — IOHEXOL 350 MG/ML SOLN
100.0000 mL | Freq: Once | INTRAVENOUS | Status: AC | PRN
  Administered 2021-12-29: 100 mL via INTRAVENOUS

## 2021-12-30 ENCOUNTER — Ambulatory Visit
Admission: RE | Admit: 2021-12-30 | Discharge: 2021-12-30 | Disposition: A | Discharge status: Home / Self Care | Payer: Medicaid Other | Source: Ambulatory Visit | Attending: Radiation Oncology | Admitting: Radiation Oncology

## 2021-12-30 ENCOUNTER — Other Ambulatory Visit: Payer: Self-pay

## 2021-12-30 ENCOUNTER — Ambulatory Visit: Payer: Medicaid Other

## 2021-12-30 ENCOUNTER — Encounter: Payer: Self-pay | Admitting: Radiation Oncology

## 2021-12-30 DIAGNOSIS — Z510 Encounter for antineoplastic radiation therapy: Secondary | ICD-10-CM | POA: Diagnosis not present

## 2021-12-30 LAB — RAD ONC ARIA SESSION SUMMARY
Course Elapsed Days: 64
Plan Fractions Treated to Date: 3
Plan Prescribed Dose Per Fraction: 18 Gy
Plan Total Fractions Prescribed: 3
Plan Total Prescribed Dose: 54 Gy
Reference Point Dosage Given to Date: 54 Gy
Reference Point Session Dosage Given: 18 Gy
Session Number: 31

## 2022-01-01 ENCOUNTER — Encounter: Payer: Self-pay | Admitting: Hematology

## 2022-01-01 NOTE — Progress Notes (Addendum)
  Radiation Oncology         [phone] ________________________________  Name: James Coffey MRN: [id number]  Date: 12/30/2021  DOB: 06/30/1970  End of Treatment Note  Diagnosis:   Oligometastatic cT3N2,M1 adenocarcinoma of the rectum  Indication for treatment:  Curative       Radiation treatment dates:   12/23/21-12/30/21  Site/dose:   The tumor in the [place] treated with a course of stereotactic body radiation treatment. The patient received 54 Gy In 3 fractions at 18 G per fraction.  Narrative: The patient tolerated radiation treatment relatively well.   The patient did not have any signs of acute toxicity during treatment.  Plan: The patient will receive a call in about one month from the radiation oncology department. He will continue follow up with Dr. Irene Limbo as well.      Carola Rhine, PAC

## 2022-01-02 ENCOUNTER — Other Ambulatory Visit: Payer: Self-pay

## 2022-01-03 ENCOUNTER — Other Ambulatory Visit: Payer: Self-pay

## 2022-01-03 ENCOUNTER — Emergency Department (HOSPITAL_COMMUNITY)
Admission: EM | Admit: 2022-01-03 | Discharge: 2022-01-03 | Disposition: A | Discharge status: Home / Self Care | Payer: Medicaid Other | Attending: Emergency Medicine | Admitting: Emergency Medicine

## 2022-01-03 ENCOUNTER — Emergency Department (HOSPITAL_COMMUNITY): Payer: Medicaid Other

## 2022-01-03 ENCOUNTER — Encounter (HOSPITAL_COMMUNITY): Payer: Self-pay

## 2022-01-03 DIAGNOSIS — N451 Epididymitis: Secondary | ICD-10-CM

## 2022-01-03 DIAGNOSIS — N5089 Other specified disorders of the male genital organs: Secondary | ICD-10-CM | POA: Diagnosis present

## 2022-01-03 DIAGNOSIS — C20 Malignant neoplasm of rectum: Secondary | ICD-10-CM | POA: Diagnosis not present

## 2022-01-03 DIAGNOSIS — D72829 Elevated white blood cell count, unspecified: Secondary | ICD-10-CM | POA: Diagnosis not present

## 2022-01-03 LAB — BASIC METABOLIC PANEL
Anion gap: 9 (ref 5–15)
BUN: 13 mg/dL (ref 6–20)
CO2: 23 mmol/L (ref 22–32)
Calcium: 9.8 mg/dL (ref 8.9–10.3)
Chloride: 106 mmol/L (ref 98–111)
Creatinine, Ser: 0.9 mg/dL (ref 0.61–1.24)
GFR, Estimated: >60 mL/min (ref >60)
Glucose, Bld: 168 mg/dL — ABNORMAL HIGH (ref 70–99)
Potassium: 3.9 mmol/L (ref 3.5–5.1)
Sodium: 138 mmol/L (ref 135–145)

## 2022-01-03 LAB — URINALYSIS, ROUTINE W REFLEX MICROSCOPIC
Bilirubin Urine: NEGATIVE
Glucose, UA: NEGATIVE mg/dL
Ketones, ur: NEGATIVE mg/dL
Nitrite: POSITIVE — AB
Protein, ur: 30 mg/dL — AB
Specific Gravity, Urine: 1.017 (ref 1.005–1.030)
WBC, UA: >50 WBC/hpf — ABNORMAL HIGH (ref 0–5)
pH: 5 (ref 5.0–8.0)

## 2022-01-03 LAB — CBC WITH DIFFERENTIAL/PLATELET
Abs Immature Granulocytes: 0.14 10*3/uL — ABNORMAL HIGH (ref 0.00–0.07)
Basophils Absolute: 0.1 10*3/uL (ref 0.0–0.1)
Basophils Relative: 0 %
Eosinophils Absolute: 0.1 10*3/uL (ref 0.0–0.5)
Eosinophils Relative: 0 %
HCT: 42.9 % (ref 39.0–52.0)
Hemoglobin: 14.6 g/dL (ref 13.0–17.0)
Immature Granulocytes: 1 %
Lymphocytes Relative: 9 %
Lymphs Abs: 1.5 10*3/uL (ref 0.7–4.0)
MCH: 31.7 pg (ref 26.0–34.0)
MCHC: 34 g/dL (ref 30.0–36.0)
MCV: 93.1 fL (ref 80.0–100.0)
Monocytes Absolute: 1.7 10*3/uL — ABNORMAL HIGH (ref 0.1–1.0)
Monocytes Relative: 10 %
Neutro Abs: 13.3 10*3/uL — ABNORMAL HIGH (ref 1.7–7.7)
Neutrophils Relative %: 80 %
Platelets: 236 10*3/uL (ref 150–400)
RBC: 4.61 MIL/uL (ref 4.22–5.81)
RDW: 16.9 % — ABNORMAL HIGH (ref 11.5–15.5)
WBC: 16.8 10*3/uL — ABNORMAL HIGH (ref 4.0–10.5)
nRBC: 0 % (ref 0.0–0.2)

## 2022-01-03 MED ORDER — LEVOFLOXACIN 500 MG PO TABS
500.0000 mg | ORAL_TABLET | Freq: Once | ORAL | Status: AC
  Administered 2022-01-03: 500 mg via ORAL
  Filled 2022-01-03: qty 1

## 2022-01-03 MED ORDER — LEVOFLOXACIN 500 MG PO TABS: 500.0000 mg | ORAL_TABLET | Freq: Every day | ORAL | 0 refills | Status: DC

## 2022-01-03 NOTE — Discharge Instructions (Signed)
Please read and follow all provided instructions.  Your diagnoses today include:  1. Epididymitis     Tests performed today include: Urine test - suggests that you have an infection in your bladder Ultrasound of the scrotum -shows swelling of the epididymitis and testicle consistent with infection Blood cell counts and electrolytes: Shows high white blood cell count consistent with infection Vital signs. See below for your results today.   Medications prescribed:  Levofloxacin: Antibiotic Please take entire course of antibiotic as prescribed, even if you start feeling better.  Please use over-the-counter NSAID medications (ibuprofen, naproxen) as directed on the packaging for pain if you do not have any reasons not to take these medications just as weak kidneys or a history of bleeding in your stomach or gut.   Home care instructions:  Follow any educational materials contained in this packet.  Follow-up instructions: Please follow-up with your primary care provider or urology referral listed in 1 week for recheck of your symptoms.  Return instructions:  Please return to the Emergency Department if you experience worsening symptoms.  Return with fever, worsening pain, persistent vomiting, worsening pain in your back.  Please return if you have any other emergent concerns.  Additional Information:  Your vital signs today were: BP 123/73 (BP Location: Left Arm)   Pulse 89   Temp 98.5 F (36.9 C) (Oral)   Resp 18   SpO2 99%  If your blood pressure (BP) was elevated above 135/85 this visit, please have this repeated by your doctor within one month. --------------

## 2022-01-03 NOTE — ED Provider Triage Note (Signed)
Emergency Medicine Provider Triage Evaluation Note  James Coffey , a 51 y.o. male  was evaluated in triage.  Pt complains of right-sided testicular swelling over the past 2 days.  He has had associated pain in his area.  Some dysuria, no penile discharge, increased frequency or urgency.  He has a history of rectal cancer.  Denies being sexually active.  No treatments prior to arrival.  Review of Systems  Positive: Scrotal pain Negative: Fever  Physical Exam  BP 123/73 (BP Location: Left Arm)   Pulse 89   Temp 98.5 F (36.9 C) (Oral)   Resp 18   SpO2 99%  Gen:   Awake, no distress   Resp:  Normal effort  MSK:   Moves extremities without difficulty  Other:  Right hemiscrotum swollen with tenderness and mild overlying erythema  Medical Decision Making  Medically screening exam initiated at 8:59 AM.  Appropriate orders placed.  [name] was informed that the remainder of the evaluation will be completed by another provider, this initial triage assessment does not replace that evaluation, and the importance of remaining in the ED until their evaluation is complete.     Carlisle Cater, PA-C 01/03/22 0900

## 2022-01-03 NOTE — ED Provider Notes (Signed)
[facility] DEPT Provider Note   CSN: [id number] Arrival date & time: 01/03/22  0825     History  Chief Complaint  Patient presents with   Groin Swelling    James Coffey is a 51 y.o. male.  Patient with history of rectal cancer with metastatic liver disease per chart review, history of inguinal hernia repair, colostomy.  Pt complains of right-sided testicular swelling over the past 2 days.  He has had associated pain in his area.  Some dysuria, no penile discharge, increased frequency or urgency.  No vomiting or constipation.  Denies being sexually active.  No treatments prior to arrival.  Currently follows with Dr. Irene Limbo of oncology and Dr. [name] of general surgery.        Home Medications Prior to Admission medications   Medication Sig Start Date End Date Taking? Authorizing Provider  acetaminophen (TYLENOL) 500 MG tablet Take 2 tablets (1,000 mg total) by mouth every 8 (eight) hours as needed for mild pain or fever. 12/19/20   Norm Parcel, PA-C  amoxicillin-clavulanate (AUGMENTIN) 875-125 MG tablet Take 1 tablet by mouth 2 (two) times daily. 11/05/21   [name], MD  docusate sodium (COLACE) 100 MG capsule Take 1 capsule (100 mg total) by mouth 2 (two) times daily. 08/31/21   Dwan Bolt, MD  ibuprofen (ADVIL) 200 MG tablet Take 400 mg by mouth every 6 (six) hours as needed for moderate pain.    [provider]  loperamide (IMODIUM) 2 MG capsule Take 2 tablets by mouth at onset of diarrhea, then 1 tab every 2hrs until 12hr without a BM. May take 2 tab every 4hrs at bedtime. If diarrhea recurs repeat. Patient not taking: Reported on 09/29/2021 05/07/21   [name], MD  LORazepam (ATIVAN) 0.5 MG tablet Take 1 tablet by mouth every 6 hours as needed for anxiety. 11/05/21   [name], MD  oxyCODONE (OXY IR/ROXICODONE) 5 MG immediate release tablet Take 1 tablet (5 mg total) by mouth every 6 (six) hours as  needed for moderate pain. 10/08/21   Allred, Shirlyn Goltz, PA-C  prochlorperazine (COMPAZINE) 10 MG tablet Take 1 tablet (10 mg total) by mouth every 6 (six) hours as needed for nausea or vomiting. 11/05/21   [name], MD      Allergies    Hydrocodone    Review of Systems   Review of Systems  Physical Exam Updated Vital Signs BP 123/73 (BP Location: Left Arm)   Pulse 89   Temp 98.5 F (36.9 C) (Oral)   Resp 18   SpO2 99%   Physical Exam Vitals and nursing note reviewed.  Constitutional:      General: He is not in acute distress.    Appearance: He is well-developed.  HENT:     Head: Normocephalic and atraumatic.  Eyes:     General:        Right eye: No discharge.        Left eye: No discharge.     Conjunctiva/sclera: Conjunctivae normal.  Cardiovascular:     Rate and Rhythm: Normal rate and regular rhythm.     Heart sounds: Normal heart sounds.  Pulmonary:     Effort: Pulmonary effort is normal.     Breath sounds: Normal breath sounds.  Abdominal:     Palpations: Abdomen is soft.     Tenderness: There is no abdominal tenderness.  Genitourinary:    Testes:  Right: Tenderness and swelling present.        Left: Tenderness or swelling not present.     Epididymis:     Right: Inflamed.     Left: Not inflamed.  Musculoskeletal:     Cervical back: Normal range of motion and neck supple.  Skin:    General: Skin is warm and dry.  Neurological:     Mental Status: He is alert.     ED Results / Procedures / Treatments   Labs (all labs ordered are listed, but only abnormal results are displayed) Labs Reviewed  CBC WITH DIFFERENTIAL/PLATELET - Abnormal; Notable for the following components:      Result Value   WBC 16.8 (*)    RDW 16.9 (*)    Neutro Abs 13.3 (*)    Monocytes Absolute 1.7 (*)    Abs Immature Granulocytes 0.14 (*)    All other components within normal limits  BASIC METABOLIC PANEL - Abnormal; Notable for the following components:    Glucose, Bld 168 (*)    All other components within normal limits  URINALYSIS, ROUTINE W REFLEX MICROSCOPIC - Abnormal; Notable for the following components:   APPearance CLOUDY (*)    Hgb urine dipstick MODERATE (*)    Protein, ur 30 (*)    Nitrite POSITIVE (*)    Leukocytes,Ua LARGE (*)    WBC, UA >50 (*)    Bacteria, UA MANY (*)    All other components within normal limits  URINE CULTURE  GC/CHLAMYDIA PROBE AMP ([place]) NOT AT [facility]    EKG None  Radiology US SCROTUM W/DOPPLER  Result Date: 01/03/2022 CLINICAL DATA:  51 year old male with right side testicular swelling for 3 days. EXAM: SCROTAL ULTRASOUND DOPPLER ULTRASOUND OF THE TESTICLES TECHNIQUE: Complete ultrasound examination of the testicles, epididymis, and other scrotal structures was performed. Color and spectral Doppler ultrasound were also utilized to evaluate blood flow to the testicles. COMPARISON:  Pelvis MRI 07/13/2021. FINDINGS: Right testicle Measurements: 3.1 x 3.2 x 2.6 cm. Suspicion of hypervascularity (image 18). Slightly heterogeneous echotexture. No microlithiasis is evident. Left testicle Measurements: 3.7 x 2.1 x 1.4 cm. Similar echotexture to the right testis although with superimposed microlithiasis (image 36). Right epididymis: Hypervascular (image 47) mildly heterogeneous epididymal head. Enlarged epididymal body. Left epididymis: Small epididymal head cysts (normal variant), otherwise within normal limits. Hydrocele: Moderate sized and mildly loculated right side hydrocele (image 5). Varicocele:  None visualized. Pulsed Doppler interrogation of both testes demonstrates normal low resistance arterial and venous waveforms bilaterally. IMPRESSION: 1. Evidence of Acute Right Side Epididymo-orchitis. Moderate size mildly loculated right side hydrocele is associated. 2. Negative for testicular torsion or mass. 3. Left testicular microlithiasis. Current literature suggests that testicular microlithiasis is not a  significant independent risk factor for development of testicular carcinoma, and that follow up imaging is not warranted in the absence of other risk factors. Monthly testicular self-examination and annual physical exams are considered appropriate surveillance. If patient has other risk factors for testicular carcinoma, then referral to Urology should be considered. (Reference: DeCastro, et al.: A 5-Year Follow up Study of Asymptomatic Men with Testicular Microlithiasis. J Urol 2008; 412:8786-7672.) Electronically Signed   By: Genevie Ann M.D.   On: 01/03/2022 10:00    Procedures Procedures    Medications Ordered in ED Medications - No data to display  ED Course/ Medical Decision Making/ A&P    Patient seen and examined. History obtained directly from patient.   Labs/EKG: Ordered CBC, BMP, UA, urine culture,  GC and chlamydia.  Imaging: Ordered scrotal ultrasound.  Medications/Fluids: None ordered, will likely need antibiotics.  Most recent vital signs reviewed and are as follows: BP 123/73 (BP Location: Left Arm)   Pulse 89   Temp 98.5 F (36.9 C) (Oral)   Resp 18   SpO2 99%   Initial impression: Right-sided orchitis/epididymitis.  Will confirm with ultrasound.  10:14 AM   Labs personally reviewed and interpreted including: CBC with elevated white blood cell count 16.8 with 13.3 neutrophils, normal hemoglobin; BMP with elevated glucose of 168 otherwise unremarkable.  Awaiting UA results.  Imaging results reviewed: Ultrasound consistent with epididymoorchitis with hydrocele.  Reviewed pertinent lab work and imaging with patient at bedside. Questions answered.   Most current vital signs reviewed and are as follows: BP 123/73 (BP Location: Left Arm)   Pulse 89   Temp 98.5 F (36.9 C) (Oral)   Resp 18   SpO2 99%   Plan: Awaiting UA results.  Will initiate therapy with levofloxacin 100 mg daily for 10 days.  12:14 PM Reassessment performed. Patient appears stable during ED  stay.  Overall looks well, nontoxic.  Labs personally reviewed and interpreted including: UA urine is cloudy with greater than 50 white blood cells, many bacteria, large leuk esterase and positive nitrates.  Also with 11-20 red blood cells.  Feel this is most likely consistent with infection.  Reviewed pertinent lab work and imaging with patient at bedside. Questions answered.   Most current vital signs reviewed and are as follows: BP 123/73 (BP Location: Left Arm)   Pulse 89   Temp 98.5 F (36.9 C) (Oral)   Resp 18   SpO2 99%   Plan: Discharge to home.   Prescriptions written for: Levofloxacin for 9 additional days  Other home care instructions discussed: Encouraged scrotal elevation, NSAIDs for pain  ED return instructions discussed: Return with fever, worsening pain or swelling.  Follow-up instructions discussed: Patient encouraged to follow-up with their PCP or urology in 7 days for recheck.                           Medical Decision Making Amount and/or Complexity of Data Reviewed Labs: ordered. Radiology: ordered.  Risk Prescription drug management.   Patient presents with exam consistent with right-sided epididymitis/orchitis.  No fevers or signs of sepsis.  He does have active metastatic rectal cancer.  UA consistent with infection.  He can tolerate orals and appears nontoxic.  Comfortable with outpatient treatment at this point.  No concern for soft tissue infection, abscess.  No concern for testicular torsion.  The patient's vital signs, pertinent lab work and imaging were reviewed and interpreted as discussed in the ED course. Hospitalization was considered for further testing, treatments, or serial exams/observation. However as patient is well-appearing, has a stable exam, and reassuring studies today, I do not feel that they warrant admission at this time. This plan was discussed with the patient who verbalizes agreement and comfort with this plan and seems reliable  and able to return to the Emergency Department with worsening or changing symptoms.          Final Clinical Impression(s) / ED Diagnoses Final diagnoses:  Epididymitis    Rx / DC Orders ED Discharge Orders          Ordered    levofloxacin (LEVAQUIN) 500 MG tablet  Daily        01/03/22 1158  Carlisle Cater, PA-C 01/03/22 1216    Wyvonnia Dusky, MD 01/03/22 [phone]

## 2022-01-03 NOTE — ED Triage Notes (Signed)
Pt reports right testicle swelling x2 days. Pt denies pain. Pt denies injury.

## 2022-01-05 ENCOUNTER — Other Ambulatory Visit: Payer: Self-pay | Admitting: Hematology

## 2022-01-05 ENCOUNTER — Ambulatory Visit
Admission: RE | Admit: 2022-01-05 | Discharge: 2022-01-05 | Disposition: A | Discharge status: Home / Self Care | Payer: Medicaid Other | Source: Ambulatory Visit | Attending: Interventional Radiology | Admitting: Interventional Radiology

## 2022-01-05 ENCOUNTER — Encounter: Payer: Self-pay | Admitting: *Deleted

## 2022-01-05 DIAGNOSIS — C20 Malignant neoplasm of rectum: Secondary | ICD-10-CM

## 2022-01-05 HISTORY — PX: IR RADIOLOGIST EVAL & MGMT: IMG5224

## 2022-01-05 LAB — URINE CULTURE: Culture: >=100000 — AB

## 2022-01-05 NOTE — Progress Notes (Signed)
Chief Complaint: Patient was consulted remotely today (TeleHealth) for follow-up after thermal ablation of the liver.  History of Present Illness: James Coffey is a 51 y.o. 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 He is status post resection of a segment III left lobe metastatic lesion by Dr. [name] on 08/28/2021.  He has now also completed radiation therapy to the rectum and to a right upper lobe pulmonary metastasis by Dr. Lisbeth Renshaw and Shona Simpson, [facility] .  He is being considered for resection of James rectal carcinoma by Dr. [name].  He was last seen by Dr. Irene Limbo on 12/01/2021.  A follow-up multiphase CT of the abdomen was performed on 12/29/2021.   Past Medical History:  Diagnosis Date   Cancer [facility])    rectal cancer with mets to liver   Tobacco abuse     Past Surgical History:  Procedure Laterality Date   HERNIA REPAIR     INCISION AND DRAINAGE ABSCESS N/A 12/01/2020   Procedure: INCISION AND DRAINAGE ABSCESS;  Surgeon: Michael Boston, MD;  Location: [facility];  Service: General;  Laterality: N/A;   INCISION AND DRAINAGE ABSCESS N/A 12/04/2020   Procedure: INCISION AND DRAINAGE FOURNIERS GANGRENE;  Surgeon: [name], MD;  Location: [facility];  Service: General;  Laterality: N/A;   INCISION AND DRAINAGE PERIRECTAL ABSCESS N/A 11/30/2020   Procedure: INCISION AND DEBRIDEMENT OF PERIRECTAL ABSCESS AND RECTAL WALL BIOPSIES;  Surgeon: Michael Boston, MD;  Location: [facility];  Service: General;  Laterality: N/A;   INGUINAL HERNIA REPAIR Right 2007   IR IMAGING GUIDED PORT INSERTION  05/07/2021   IR RADIOLOGIST EVAL & MGMT  08/27/2021   IR RADIOLOGIST EVAL & MGMT  09/12/2021   IR RADIOLOGIST EVAL & MGMT  11/10/2021   LAPAROSCOPIC LOOP COLOSTOMY N/A 12/13/2020   Procedure: DIAGNOSTIC LAPAROSCOPY WITH  OPEN DESCENDING LOOP COLOSTOMY;  Surgeon: Armandina Gemma, MD;  Location: [facility];   Service: General;  Laterality: N/A;   LAPAROSCOPY N/A 08/28/2021   Procedure: LAPAROSCOPY DIAGNOSTIC;  Surgeon: Dwan Bolt, MD;  Location: [place];  Service: General;  Laterality: N/A;   OPEN PARTIAL HEPATECTOMY  Left 08/28/2021   Procedure: OPEN LEFT LATERAL HEPATECTOMY WITH INTRAOPERATIVE ULTRASOUND;  Surgeon: Dwan Bolt, MD;  Location: [place];  Service: General;  Laterality: Left;   RADIOLOGY WITH ANESTHESIA N/A 10/08/2021   Procedure: MICROWAVE ABLATION;  Surgeon: Aletta Edouard, MD;  Location: [facility];  Service: Radiology;  Laterality: N/A;   RECTAL SURGERY      Allergies: Hydrocodone  Medications: Prior to Admission medications   Medication Sig Start Date End Date Taking? Authorizing Provider  acetaminophen (TYLENOL) 500 MG tablet Take 2 tablets (1,000 mg total) by mouth every 8 (eight) hours as needed for mild pain or fever. 12/19/20   Norm Parcel, PA-C  amoxicillin-clavulanate (AUGMENTIN) 875-125 MG tablet Take 1 tablet by mouth 2 (two) times daily. 11/05/21   [name], MD  docusate sodium (COLACE) 100 MG capsule Take 1 capsule (100 mg total) by mouth 2 (two) times daily. 08/31/21   Dwan Bolt, MD  ibuprofen (ADVIL) 200 MG tablet Take 400 mg by mouth every 6 (six) hours as needed for moderate pain.    [provider]  levofloxacin (LEVAQUIN) 500 MG tablet Take 1 tablet (500 mg total) by mouth daily. 01/04/22   Carlisle Cater, PA-C  loperamide (IMODIUM) 2 MG  capsule Take 2 tablets by mouth at onset of diarrhea, then 1 tab every 2hrs until 12hr without a BM. May take 2 tab every 4hrs at bedtime. If diarrhea recurs repeat. Patient not taking: Reported on 09/29/2021 05/07/21   [name], MD  LORazepam (ATIVAN) 0.5 MG tablet Take 1 tablet by mouth every 6 hours as needed for anxiety. 11/05/21   [name], MD  oxyCODONE (OXY IR/ROXICODONE) 5 MG immediate release tablet Take 1 tablet (5 mg total) by mouth every 6 (six) hours as needed for  moderate pain. 10/08/21   Allred, Shirlyn Goltz, PA-C  prochlorperazine (COMPAZINE) 10 MG tablet Take 1 tablet (10 mg total) by mouth every 6 (six) hours as needed for nausea or vomiting. 11/05/21   [name], MD     Family History  Problem Relation Age of Onset   CAD Neg Hx    Inflammatory bowel disease Neg Hx     Social History   Socioeconomic History   Marital status: Married    Spouse name: Not on file   Number of children: Not on file   Years of education: Not on file   Highest education level: Not on file  Occupational History   Occupation: truck driver  Tobacco Use   Smoking status: Every Day    Packs/day: 0.25    Types: Cigarettes   Smokeless tobacco: Never  Vaping Use   Vaping Use: Never used  Substance and Sexual Activity   Alcohol use: No   Drug use: No   Sexual activity: Never  Other Topics Concern   Not on file  Social History Narrative   From [place].  Came to the [place] in 2000.   Social Determinants of Health   Financial Resource Strain: Not on file  Food Insecurity: Not on file  Transportation Needs: Not on file  Physical Activity: Not on file  Stress: Not on file  Social Connections: Not on file    ECOG Status: 0 - Asymptomatic  Review of Systems  Constitutional: Negative.   Respiratory: Negative.    Cardiovascular: Negative.   Genitourinary: Negative.   Musculoskeletal: Negative.   Neurological: Negative.     Review of Systems: A 12 point ROS discussed and pertinent positives are indicated in the HPI above.  All other systems are negative.    Physical Exam No direct physical exam was performed (except for noted visual exam findings with Video Visits).   Vital Signs: There were no vitals taken for this visit.  Imaging: US SCROTUM W/DOPPLER  Result Date: 01/03/2022 CLINICAL DATA:  51 year old male with right side testicular swelling for 3 days. EXAM: SCROTAL ULTRASOUND DOPPLER ULTRASOUND OF THE TESTICLES TECHNIQUE: Complete  ultrasound examination of the testicles, epididymis, and other scrotal structures was performed. Color and spectral Doppler ultrasound were also utilized to evaluate blood flow to the testicles. COMPARISON:  Pelvis MRI 07/13/2021. FINDINGS: Right testicle Measurements: 3.1 x 3.2 x 2.6 cm. Suspicion of hypervascularity (image 18). Slightly heterogeneous echotexture. No microlithiasis is evident. Left testicle Measurements: 3.7 x 2.1 x 1.4 cm. Similar echotexture to the right testis although with superimposed microlithiasis (image 36). Right epididymis: Hypervascular (image 47) mildly heterogeneous epididymal head. Enlarged epididymal body. Left epididymis: Small epididymal head cysts (normal variant), otherwise within normal limits. Hydrocele: Moderate sized and mildly loculated right side hydrocele (image 5). Varicocele:  None visualized. Pulsed Doppler interrogation of both testes demonstrates normal low resistance arterial and venous waveforms bilaterally. IMPRESSION: 1. Evidence of Acute Right Side Epididymo-orchitis.  Moderate size mildly loculated right side hydrocele is associated. 2. Negative for testicular torsion or mass. 3. Left testicular microlithiasis. Current literature suggests that testicular microlithiasis is not a significant independent risk factor for development of testicular carcinoma, and that follow up imaging is not warranted in the absence of other risk factors. Monthly testicular self-examination and annual physical exams are considered appropriate surveillance. If patient has other risk factors for testicular carcinoma, then referral to Urology should be considered. (Reference: DeCastro, et al.: A 5-Year Follow up Study of Asymptomatic Men with Testicular Microlithiasis. J Urol 2008; 259:5638-7564.) Electronically Signed   By: Genevie Ann M.D.   On: 01/03/2022 10:00   CT ABDOMEN W WO CONTRAST  Result Date: 12/29/2021 CLINICAL DATA:  History of rectal cancer with metastatic disease to the  liver post surgical resection of LEFT hepatic lobe metastasis and single metastatic lesion suspected in the RIGHT hepatic lobe now post ablation. EXAM: CT ABDOMEN WITHOUT AND WITH CONTRAST TECHNIQUE: Multidetector CT imaging of the abdomen was performed following the standard protocol before and following the bolus administration of intravenous contrast. RADIATION DOSE REDUCTION: This exam was performed according to the departmental dose-optimization program which includes automated exposure control, adjustment of the mA and/or kV according to patient size and/or use of iterative reconstruction technique. CONTRAST:  171m OMNIPAQUE IOHEXOL 350 MG/ML SOLN COMPARISON:  CT of the chest from July 15, 2021. FINDINGS: Lower chest: Lobulated nodule in the lingula (image 10/5) 14 x 11 mm new from previous imaging. No effusion or consolidative changes. Hepatobiliary: Moderate hepatic steatosis. Post LEFT partial hepatic resection of lateral segment of LEFT hepatic lobe, wedge type resection. Post thermal ablation of RIGHT hepatic lobe metastasis. Mixed density within the ablation zone compatible with post ablation changes. This is along the course of anterior division RIGHT portal branches to the inferior RIGHT hemiliver and posterior to the plane of the middle hepatic vein. Ablation zone measuring 3.5 x 2.7 cm. No discrete area to suggest viable tumor in this location nor area of enhancement to suggest residual tumor or recurrent tumor at the site of surgical resection in the liver. No pericholecystic stranding or sign of biliary duct dilation. Pancreas: Normal, without mass, inflammation or ductal dilatation. Spleen: Normal. Adrenals/Urinary Tract: Adrenal glands are normal. No suspicious renal lesion or hydronephrosis. Cyst in the upper pole the RIGHT kidney measuring 1.8 cm compatible with Bosniak category I cyst for which no additional imaging follow-up is recommended. Stomach/Bowel: LEFT lower quadrant colostomy  with parastomal herniation of fat. No signs of acute bowel process to the extent evaluated on this abdominal CT. Vascular/Lymphatic: Patent abdominal vasculature. Calcified and noncalcified atheromatous plaque in the abdominal aorta without aneurysmal dilation. There is no gastrohepatic or hepatoduodenal ligament lymphadenopathy. No retroperitoneal or mesenteric lymphadenopathy. Other: No ascites. Parastomal herniation of fat about the LEFT lower quadrant ostomy. Musculoskeletal: No acute or significant osseous findings. IMPRESSION: 1. Post thermal ablation of RIGHT hepatic lobe metastasis. No signs of disease recurrence or new lesion. Note that the degree of hepatic steatosis with limited assessment for hypovascular metastatic disease. 2. Post partial hepatic/wedge resection of lateral segment LEFT hepatic lobe, hepatic subsegment III 3. New nodule in the lingula, suspicious for metastatic disease. Could consider PET imaging for further assessment as warranted. 4. LEFT lower quadrant colostomy with parastomal herniation of fat. 5. Aortic atherosclerosis. Aortic Atherosclerosis (ICD10-I70.0). Electronically Signed   By: GZetta BillsM.D.   On: 12/29/2021 11:16    Labs:  CBC: Recent Labs  10/24/21 0917 11/05/21 1210 12/01/21 1131 01/03/22 0901  WBC 16.1* 11.2* 8.5 16.8*  HGB 13.5 12.7* 12.9* 14.6  HCT 40.1 37.3* 39.0 42.9  PLT 341 267 215 236    COAGS: Recent Labs    10/02/21 0907  INR 1.0    BMP: Recent Labs    10/24/21 0917 11/05/21 1210 12/01/21 1131 01/03/22 0901  NA 138 137 139 138  K 4.0 4.1 3.9 3.9  CL 106 104 109 106  CO2 '25 26 24 23  '$ GLUCOSE 121* 105* 92 168*  BUN '13 15 11 13  '$ CALCIUM 9.6 9.9 9.4 9.8  CREATININE 0.76 0.70 0.70 0.90  GFRNONAA >60 >60 >60 >60    LIVER FUNCTION TESTS: Recent Labs    10/02/21 0907 10/24/21 0917 11/05/21 1210 12/01/21 1131  BILITOT 0.8 0.9 0.6 0.7  AST 16 11* 10* 12*  ALT '17 14 14 15  '$ ALKPHOS 107 128* 117 98  PROT 7.7 8.2*  8.0 7.3  ALBUMIN 3.8 4.1 4.0 4.0     Assessment and Plan:  I spoke with James Coffey and James Coffey over the phone.  The recent follow-up CT of the abdomen dated 12/25/2021 demonstrates a well-circumscribed ablation defect within the right lobe of the liver that encompasses the previously imaged metastatic lesion with no evidence of residual enhancing metastatic disease.  After partial resection of the left lobe of the liver there is no evidence of residual metastatic disease in the left lobe.  No new metastatic disease is identified in the liver.  There is detection on the study of a new 1.1 x 1.4 cm nodule in the inferior lingula of the left lung consistent with metastatic disease.  The medial aspect of the nodule does nearly abut the pericardium.  I told James Coffey that I would message James other treating physicians about this new nodule which may be amenable to radiation therapy.    Electronically Signed: Azzie Roup 01/05/2022, 1:48 PM    I spent a total of 10 Minutes in remote  clinical consultation, greater than 50% of which was counseling/coordinating care post thermal ablation of the liver.    Visit type: Audio only (telephone). Audio (no video) only due to patient's lack of internet/smartphone capability. Alternative for in-person consultation at [facility], [address], [place], [place]. This visit type was conducted due to national recommendations for restrictions regarding the COVID-19 Pandemic (e.g. social distancing).  This format is felt to be most appropriate for this patient at this time.  All issues noted in this document were discussed and addressed.

## 2022-01-06 ENCOUNTER — Telehealth (HOSPITAL_BASED_OUTPATIENT_CLINIC_OR_DEPARTMENT_OTHER): Payer: Self-pay | Admitting: Emergency Medicine

## 2022-01-06 NOTE — Telephone Encounter (Signed)
Post ED Visit - Positive Culture Follow-up  Culture report reviewed by antimicrobial stewardship pharmacist: [place] Team '[]'$  Elenor Quinones, Pharm.D. '[]'$  Heide Guile, Pharm.D., BCPS AQ-ID '[]'$  Parks Neptune, Pharm.D., BCPS '[]'$  Alycia Rossetti, Pharm.D., BCPS '[]'$  [place], [place].D., BCPS, AAHIVP '[]'$  Legrand Como, Pharm.D., BCPS, AAHIVP '[]'$  [name] Arnt, PharmD, BCPS '[]'$  Johnnette Gourd, PharmD, BCPS '[]'$  Hughes Better, PharmD, BCPS '[]'$  Leeroy Cha, PharmD '[]'$  Laqueta Linden, PharmD, BCPS '[]'$  Albertina Parr, PharmD  [place] Team '[]'$  Leodis Sias, PharmD '[]'$  Lindell Spar, PharmD '[]'$  Royetta Asal, PharmD '[]'$  Graylin Shiver, Rph '[]'$  Rema Fendt) Glennon Mac, PharmD '[]'$  Arlyn Dunning, PharmD '[]'$  Netta Cedars, PharmD '[]'$  Dia Sitter, PharmD '[]'$  Leone Haven, PharmD '[]'$  Gretta Arab, PharmD '[]'$  Theodis Shove, PharmD '[]'$  Peggyann Juba, PharmD '[]'$  Reuel Boom, PharmD   Positive urine culture Treated with levofloxacin, organism sensitive to the same and no further patient follow-up is required at this time.  Hazle Nordmann 01/06/2022, 9:16 AM

## 2022-01-16 ENCOUNTER — Other Ambulatory Visit: Payer: Self-pay | Admitting: *Deleted

## 2022-01-16 DIAGNOSIS — C20 Malignant neoplasm of rectum: Secondary | ICD-10-CM

## 2022-01-20 ENCOUNTER — Other Ambulatory Visit: Payer: Self-pay

## 2022-01-20 ENCOUNTER — Inpatient Hospital Stay: Payer: Medicaid Other | Attending: Hematology | Admitting: Hematology

## 2022-01-20 ENCOUNTER — Inpatient Hospital Stay: Payer: Medicaid Other

## 2022-01-20 VITALS — BP 112/72 | HR 88 | Temp 97.7°F | Resp 15 | Ht 69.0 in | Wt 180.9 lb

## 2022-01-20 DIAGNOSIS — C20 Malignant neoplasm of rectum: Secondary | ICD-10-CM | POA: Insufficient documentation

## 2022-01-20 DIAGNOSIS — C775 Secondary and unspecified malignant neoplasm of intrapelvic lymph nodes: Secondary | ICD-10-CM | POA: Insufficient documentation

## 2022-01-20 DIAGNOSIS — C787 Secondary malignant neoplasm of liver and intrahepatic bile duct: Secondary | ICD-10-CM | POA: Diagnosis not present

## 2022-01-20 DIAGNOSIS — Z95828 Presence of other vascular implants and grafts: Secondary | ICD-10-CM

## 2022-01-20 LAB — CBC WITH DIFFERENTIAL (CANCER CENTER ONLY)
Abs Immature Granulocytes: 0.04 10*3/uL (ref 0.00–0.07)
Basophils Absolute: 0.1 10*3/uL (ref 0.0–0.1)
Basophils Relative: 1 %
Eosinophils Absolute: 0.1 10*3/uL (ref 0.0–0.5)
Eosinophils Relative: 2 %
HCT: 39.4 % (ref 39.0–52.0)
Hemoglobin: 13.8 g/dL (ref 13.0–17.0)
Immature Granulocytes: 1 %
Lymphocytes Relative: 18 %
Lymphs Abs: 1.3 10*3/uL (ref 0.7–4.0)
MCH: 31.9 pg (ref 26.0–34.0)
MCHC: 35 g/dL (ref 30.0–36.0)
MCV: 91.2 fL (ref 80.0–100.0)
Monocytes Absolute: 0.7 10*3/uL (ref 0.1–1.0)
Monocytes Relative: 9 %
Neutro Abs: 5 10*3/uL (ref 1.7–7.7)
Neutrophils Relative %: 69 %
Platelet Count: 202 10*3/uL (ref 150–400)
RBC: 4.32 MIL/uL (ref 4.22–5.81)
RDW: 15.4 % (ref 11.5–15.5)
WBC Count: 7.3 10*3/uL (ref 4.0–10.5)
nRBC: 0 % (ref 0.0–0.2)

## 2022-01-20 LAB — CMP (CANCER CENTER ONLY)
ALT: 17 U/L (ref 0–44)
AST: 16 U/L (ref 15–41)
Albumin: 3.7 g/dL (ref 3.5–5.0)
Alkaline Phosphatase: 92 U/L (ref 38–126)
Anion gap: 7 (ref 5–15)
BUN: 11 mg/dL (ref 6–20)
CO2: 23 mmol/L (ref 22–32)
Calcium: 9.3 mg/dL (ref 8.9–10.3)
Chloride: 109 mmol/L (ref 98–111)
Creatinine: 0.76 mg/dL (ref 0.61–1.24)
GFR, Estimated: >60 mL/min (ref >60)
Glucose, Bld: 182 mg/dL — ABNORMAL HIGH (ref 70–99)
Potassium: 3.8 mmol/L (ref 3.5–5.1)
Sodium: 139 mmol/L (ref 135–145)
Total Bilirubin: 0.6 mg/dL (ref 0.3–1.2)
Total Protein: 7.2 g/dL (ref 6.5–8.1)

## 2022-01-20 LAB — CEA (IN HOUSE-CHCC): CEA (CHCC-In House): 2.36 ng/mL (ref 0.00–5.00)

## 2022-01-20 MED ORDER — HEPARIN SOD (PORK) LOCK FLUSH 100 UNIT/ML IV SOLN
500.0000 [IU] | Freq: Once | INTRAVENOUS | Status: AC
  Administered 2022-01-20: 500 [IU]

## 2022-01-20 MED ORDER — SODIUM CHLORIDE 0.9% FLUSH
10.0000 mL | Freq: Once | INTRAVENOUS | Status: AC
  Administered 2022-01-20: 10 mL

## 2022-01-21 NOTE — Progress Notes (Signed)
COVID Vaccine Completed: yes x2  Date of COVID positive in last 90 days:  PCP -  Cardiologist -   Chest x-ray -  EKG - 10/08/21 Epic Stress Test -  ECHO - 12/06/20 Epic Cardiac Cath -  Pacemaker/ICD device last checked: Spinal Cord Stimulator: CT- 07/16/21 Epic  Bowel Prep - ??  Sleep Study -  CPAP -   Fasting Blood Sugar -  Checks Blood Sugar _____ times a day  Blood Thinner Instructions: Aspirin Instructions: Last Dose:  Activity level:  Can go up a flight of stairs and perform activities of daily living without stopping and without symptoms of chest pain or shortness of breath.  Able to exercise without symptoms  Unable to go up a flight of stairs without symptoms of     Anesthesia review: rectal cancer with metastasis to liver  Patient denies shortness of breath, fever, cough and chest pain at PAT appointment  Patient verbalized understanding of instructions that were given to them at the PAT appointment. Patient was also instructed that they will need to review over the PAT instructions again at home before surgery.

## 2022-01-21 NOTE — Patient Instructions (Signed)
SURGICAL WAITING ROOM VISITATION Patients having surgery or a procedure may have no more than 2 support people in the waiting area - these visitors may rotate.   Children under the age of 49 must have an adult with them who is not the patient. If the patient needs to stay at the hospital during part of their recovery, the visitor guidelines for inpatient rooms apply. Pre-op nurse will coordinate an appropriate time for 1 support person to accompany patient in pre-op.  This support person may not rotate.    Please refer to the [facility] website for the visitor guidelines for Inpatients (after your surgery is over and you are in a regular room).    Your procedure is scheduled on: 02/04/22   Report to [facility] Main Entrance    Report to admitting at 6:45 AM   Call this number if you have problems the morning of surgery [phone]   Follow clear liquid diet the day before surgery   You may have the following liquids until 6:00 AM DAY OF SURGERY  Water Non-Citrus Juices (without pulp, NO RED) Carbonated Beverages Black Coffee (NO MILK/CREAM OR CREAMERS, sugar ok)  Clear Tea (NO MILK/CREAM OR CREAMERS, sugar ok) regular and decaf                             Plain Jell-O (NO RED)                                           Fruit ices (not with fruit pulp, NO RED)                                     Popsicles (NO RED)                                                               Sports drinks like Gatorade (NO RED)              Drink 2 Ensure drinks AT 10:00 PM the night before surgery.        The day of surgery:  Drink ONE (1) Pre-Surgery Clear Ensure at 6:00 AM the morning of surgery. Drink in one sitting. Do not sip.  This drink was given to you during your hospital  pre-op appointment visit. Nothing else to drink after completing the  Pre-Surgery Clear Ensure.          If you have questions, please contact your surgeon's office.   FOLLOW BOWEL PREP AND ANY  ADDITIONAL PRE OP INSTRUCTIONS YOU RECEIVED FROM YOUR SURGEON'S OFFICE!!!     Oral Hygiene is also important to reduce your risk of infection.                                    Remember - BRUSH YOUR TEETH THE MORNING OF SURGERY WITH YOUR REGULAR TOOTHPASTE   Do NOT smoke after Midnight   Take these medicines the morning of surgery with A SIP  OF WATER: Tylenol                          You may not have any metal on your body including  jewelry, and body piercing             Do not wear lotions, powders, cologne, or deodorant              Men may shave face and neck.   Do not bring valuables to the hospital. [place].   Contacts, dentures or bridgework may not be worn into surgery.   Bring small overnight bag day of surgery.   DO NOT [place]. PHARMACY WILL DISPENSE MEDICATIONS LISTED ON YOUR MEDICATION LIST TO YOU DURING YOUR ADMISSION [place]!   Special Instructions: Bring a copy of your healthcare power of attorney and living will documents         the day of surgery if you haven't scanned them before.              Please read over the following fact sheets you were given: IF YOU HAVE QUESTIONS ABOUT YOUR PRE-OP INSTRUCTIONS PLEASE CALL [place] - Preparing for Surgery Before surgery, you can play an important role.  Because skin is not sterile, your skin needs to be as free of germs as possible.  You can reduce the number of germs on your skin by washing with CHG (chlorahexidine gluconate) soap before surgery.  CHG is an antiseptic cleaner which kills germs and bonds with the skin to continue killing germs even after washing. Please DO NOT use if you have an allergy to CHG or antibacterial soaps.  If your skin becomes reddened/irritated stop using the CHG and inform your nurse when you arrive at Short Stay. Do not shave (including legs and underarms) for at  least 48 hours prior to the first CHG shower.  You may shave your face/neck.  Please follow these instructions carefully:  1.  Shower with CHG Soap the night before surgery and the  morning of surgery.  2.  If you choose to wash your hair, wash your hair first as usual with your normal  shampoo.  3.  After you shampoo, rinse your hair and body thoroughly to remove the shampoo.                             4.  Use CHG as you would any other liquid soap.  You can apply chg directly to the skin and wash.  Gently with a scrungie or clean washcloth.  5.  Apply the CHG Soap to your body ONLY FROM THE NECK DOWN.   Do   not use on face/ open                           Wound or open sores. Avoid contact with eyes, ears mouth and   genitals (private parts).                       Wash face,  Genitals (private parts) with your normal soap.             6.  Wash thoroughly,  paying special attention to the area where your    surgery  will be performed.  7.  Thoroughly rinse your body with warm water from the neck down.  8.  DO NOT shower/wash with your normal soap after using and rinsing off the CHG Soap.                9.  Pat yourself dry with a clean towel.            10.  Wear clean pajamas.            11.  Place clean sheets on your bed the night of your first shower and do not  sleep with pets. Day of Surgery : Do not apply any lotions/deodorants the morning of surgery.  Please wear clean clothes to the hospital/surgery center.  FAILURE TO FOLLOW THESE INSTRUCTIONS MAY RESULT IN THE CANCELLATION OF YOUR SURGERY  PATIENT SIGNATURE_________________________________  NURSE SIGNATURE__________________________________  ________________________________________________________________________   James Coffey  An incentive spirometer is a tool that can help keep your lungs clear and active. This tool measures how well you are filling your lungs with each breath. Taking long deep breaths may help  reverse or decrease the chance of developing breathing (pulmonary) problems (especially infection) following: A long period of time when you are unable to move or be active. BEFORE THE PROCEDURE  If the spirometer includes an indicator to show your best effort, your nurse or respiratory therapist will set it to a desired goal. If possible, sit up straight or lean slightly forward. Try not to slouch. Hold the incentive spirometer in an upright position. INSTRUCTIONS FOR USE  Sit on the edge of your bed if possible, or sit up as far as you can in bed or on a chair. Hold the incentive spirometer in an upright position. Breathe out normally. Place the mouthpiece in your mouth and seal your lips tightly around it. Breathe in slowly and as deeply as possible, raising the piston or the ball toward the top of the column. Hold your breath for 3-5 seconds or for as long as possible. Allow the piston or ball to fall to the bottom of the column. Remove the mouthpiece from your mouth and breathe out normally. Rest for a few seconds and repeat Steps 1 through 7 at least 10 times every 1-2 hours when you are awake. Take your time and take a few normal breaths between deep breaths. The spirometer may include an indicator to show your best effort. Use the indicator as a goal to work toward during each repetition. After each set of 10 deep breaths, practice coughing to be sure your lungs are clear. If you have an incision (the cut made at the time of surgery), support your incision when coughing by placing a pillow or rolled up towels firmly against it. Once you are able to get out of bed, walk around indoors and cough well. You may stop using the incentive spirometer when instructed by your caregiver.  RISKS AND COMPLICATIONS Take your time so you do not get dizzy or light-headed. If you are in pain, you may need to take or ask for pain medication before doing incentive spirometry. It is harder to take a deep  breath if you are having pain. AFTER USE Rest and breathe slowly and easily. It can be helpful to keep track of a log of your progress. Your caregiver can provide you with a simple table to help with this. If you are using  the spirometer at home, follow these instructions: [place] IF:  You are having difficultly using the spirometer. You have trouble using the spirometer as often as instructed. Your pain medication is not giving enough relief while using the spirometer. You develop fever of 100.5 F (38.1 C) or higher. SEEK IMMEDIATE MEDICAL CARE IF:  You cough up bloody sputum that had not been present before. You develop fever of 102 F (38.9 C) or greater. You develop worsening pain at or near the incision site. MAKE SURE YOU:  Understand these instructions. Will watch your condition. Will get help right away if you are not doing well or get worse. Document Released: 10/05/2006 Document Revised: 08/17/2011 Document Reviewed: 12/06/2006 [facility] Patient Information 2014 [place], [place].   ________________________________________________________________________

## 2022-01-22 ENCOUNTER — Encounter (HOSPITAL_COMMUNITY)
Admission: RE | Admit: 2022-01-22 | Discharge: 2022-01-22 | Disposition: A | Discharge status: Home / Self Care | Payer: Medicaid Other | Source: Ambulatory Visit | Attending: General Surgery | Admitting: General Surgery

## 2022-01-22 ENCOUNTER — Encounter (HOSPITAL_COMMUNITY): Payer: Self-pay

## 2022-01-22 ENCOUNTER — Telehealth: Payer: Self-pay | Admitting: Hematology

## 2022-01-22 VITALS — BP 122/84 | HR 86 | Temp 97.7°F | Resp 14 | Ht 69.0 in | Wt 178.0 lb

## 2022-01-22 DIAGNOSIS — Z01818 Encounter for other preprocedural examination: Secondary | ICD-10-CM

## 2022-01-22 DIAGNOSIS — Z01812 Encounter for preprocedural laboratory examination: Secondary | ICD-10-CM | POA: Diagnosis present

## 2022-01-22 LAB — TYPE AND SCREEN
ABO/RH(D): A NEG
Antibody Screen: NEGATIVE

## 2022-01-22 NOTE — Telephone Encounter (Signed)
Left message with follow-up appointment per 8/15 los. 

## 2022-01-22 NOTE — Progress Notes (Signed)
  Radiation Oncology         [phone] ________________________________  Name: James Coffey MRN: [id number]  Date of Service: 01/27/2022  DOB: 1971-02-01  Post Treatment Telephone Note  Diagnosis:   Oligometastatic cT3N2,M1 adenocarcinoma of the rectum   Indication for treatment:  Curative        Radiation treatment dates:   12/23/21-12/30/21    Site/dose:   The tumor in the RUL was treated with a course of stereotactic body radiation treatment. The patient received 54 Gy In 3 fractions at 18 G per fraction.   Narrative: The patient tolerated radiation treatment relatively well.   The patient did not have any signs of acute toxicity during treatment.   Impression/Plan: 1. Oligometastatic cT3N2,M1 adenocarcinoma of the rectum.  I was unable to reach the patient but left a voicemail and on the message, I discussed that we would be happy to continue to follow her as needed, but she will also continue to follow up with Dr. Irene Limbo in medical oncology. He has an upcoming PET in October and we will follow along peripherally with this as well.        Carola Rhine, PAC

## 2022-01-23 ENCOUNTER — Other Ambulatory Visit: Payer: Self-pay

## 2022-01-26 NOTE — Progress Notes (Signed)
[place]   [phone] [phone]   Clinic Follow up Note   Patient Care Team: Patient, No Pcp Per as PCP - General ([place]) [name], MD as Consulting Physician (Urology) [name], MD as Consulting Physician (Gastroenterology) [name], MD as Consulting Physician (Colon and Rectal Surgery) [name], MD as Consulting Physician (Oncology) Royston Bake, RN as Oncology Nurse Navigator (Oncology)  Date of Service:01/20/2022  CHIEF COMPLAINT:  Follow-up for continued evaluation and management of oligometastatic rectal cancer  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[place])  12/17/2020 Initial Diagnosis   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[place])   01/13/2021 Cancer Staging   Staging form: Colon and Rectum, AJCC 8th Edition - Clinical stage from 01/13/2021: Stage IIIB (cT3, cN2a, cM0) - Signed by [name], MD on 01/19/2021 Stage prefix: Initial diagnosis Histologic grade (G): G2 Histologic grading system: 4 grade system   01/20/2021 - 03/26/2021 Chemotherapy   Patient is on Treatment Plan : RECTAL Xelox ([place]ox) q21d x 6 cycles     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   INTERVAL HISTORY:   James Coffey is a 51 y.o. male here for continued management of his oligometastatic rectal cancer.  No acute new symptoms since his last clinic visit.  Good p.o. intake with no gain in weight from 173 pounds to 180 pounds in the last 3 months. No notable  toxicities from his rectal radiation or chemotherapy at this time. We will on 02/04/2022 by Dr. [name] for a robotic abdominal perineal resection of his rectal tumor. We discussed that we would like to get repeat imaging studies a couple of months after his rectal surgery to evaluate the status of his disease. Labs done today were discussed in detail with the patient.  MEDICAL HISTORY:  Past Medical History:  Diagnosis Date   Cancer ([place])    rectal cancer with mets to liver   Tobacco abuse     SURGICAL HISTORY: Past Surgical History:  Procedure Laterality Date   HERNIA REPAIR     INCISION AND DRAINAGE ABSCESS N/A 12/01/2020   Procedure: INCISION AND DRAINAGE ABSCESS;  Surgeon: Michael Boston, MD;  Location: [facility];  Service: General;  Laterality: N/A;   INCISION AND DRAINAGE ABSCESS N/A 12/04/2020   Procedure: INCISION AND DRAINAGE FOURNIERS GANGRENE;  Surgeon: [name], MD;  Location: [facility];  Service: General;  Laterality: N/A;   INCISION AND DRAINAGE PERIRECTAL ABSCESS N/A 11/30/2020   Procedure: INCISION AND DEBRIDEMENT OF PERIRECTAL ABSCESS AND RECTAL WALL BIOPSIES;  Surgeon: Michael Boston, MD;  Location: [facility];  Service: General;  Laterality: N/A;   INGUINAL HERNIA REPAIR Right 2007   IR IMAGING GUIDED PORT INSERTION  05/07/2021   IR RADIOLOGIST EVAL & MGMT  08/27/2021   IR RADIOLOGIST EVAL & MGMT  09/12/2021   IR RADIOLOGIST EVAL & MGMT  11/10/2021   IR RADIOLOGIST EVAL & MGMT  01/05/2022   LAPAROSCOPIC LOOP COLOSTOMY N/A 12/13/2020   Procedure: DIAGNOSTIC LAPAROSCOPY WITH  OPEN DESCENDING LOOP COLOSTOMY;  Surgeon: Armandina Gemma, MD;  Location:  [facility];  Service: General;  Laterality: N/A;   LAPAROSCOPY N/A 08/28/2021   Procedure: LAPAROSCOPY DIAGNOSTIC;  Surgeon: Dwan Bolt, MD;  Location: [place];  Service: General;  Laterality: N/A;   OPEN PARTIAL HEPATECTOMY  Left 08/28/2021   Procedure: OPEN LEFT LATERAL HEPATECTOMY WITH INTRAOPERATIVE ULTRASOUND;  Surgeon: Dwan Bolt, MD;  Location: [place];  Service: General;  Laterality: Left;   RADIOLOGY WITH ANESTHESIA N/A 10/08/2021   Procedure: MICROWAVE ABLATION;  Surgeon: Aletta Edouard, MD;  Location: [facility];  Service: Radiology;  Laterality: N/A;   RECTAL SURGERY      ALLERGIES:  is allergic to hydrocodone.  ROS 10 Point review of Systems was done is negative except as noted above.   MEDICATIONS:  Current Outpatient Medications  Medication Sig Dispense Refill   acetaminophen (TYLENOL) 500 MG tablet Take 2 tablets (1,000 mg total) by mouth every 8 (eight) hours as needed for mild pain or fever.     docusate sodium (COLACE) 100 MG capsule Take 1 capsule (100 mg total) by mouth 2 (two) times daily. (Patient not taking: Reported on 01/21/2022) 10 capsule 0   ibuprofen (ADVIL) 200 MG tablet Take 400 mg by mouth every 6 (six) hours as needed for moderate pain.     loperamide (IMODIUM) 2 MG capsule Take 2 tablets by mouth at onset of diarrhea, then 1 tab every 2hrs until 12hr without a BM. May take 2 tab every 4hrs at bedtime. If diarrhea recurs repeat. (Patient not taking: Reported on 09/29/2021) 30 capsule 1   LORazepam (ATIVAN) 0.5 MG tablet Take 1 tablet by mouth every 6 hours as needed for anxiety. (Patient not taking: Reported on 01/21/2022) 30 tablet 0   prochlorperazine (COMPAZINE) 10 MG tablet Take 1 tablet (10 mg total) by mouth every 6 (six) hours as needed for nausea or vomiting. (Patient not taking: Reported on 01/21/2022) 30 tablet 1   No current facility-administered medications for this visit.   Facility-Administered Medications Ordered in Other Visits  Medication Dose Route Frequency Provider Last Rate Last Admin   palonosetron (ALOXI) 0.25 MG/5ML injection            sodium chloride flush (NS) 0.9 % injection 10 mL  10 mL Intracatheter Once [name], MD        PHYSICAL EXAMINATION: ECOG PERFORMANCE STATUS: 1 - Symptomatic but completely ambulatory  Vital signs reviewed Wt Readings  from Last 3 Encounters:  01/22/22 178 lb (80.7 kg)  01/20/22 180 lb 14.4 oz (82.1 kg)  12/01/21 177 lb 9.6 oz (80.6 kg)  NAD GENERAL:alert, in no acute distress and comfortable SKIN: no acute rashes, no significant lesions EYES: conjunctiva are pink and non-injected, sclera anicteric OROPHARYNX: MMM, no exudates, no oropharyngeal erythema or ulceration NECK: supple, no JVD LYMPH:  no palpable lymphadenopathy in the cervical, axillary or inguinal regions LUNGS: clear to auscultation b/l with normal respiratory effort HEART: regular rate & rhythm ABDOMEN:  normoactive bowel sounds , non tender, not distended.,  Colostomy in situ Extremity: no pedal edema PSYCH: alert & oriented x 3 with fluent speech NEURO: no focal motor/sensory deficits  LABORATORY DATA:  .    Latest Ref Rng & Units 01/20/2022    8:21 AM 01/03/2022    9:01 AM 12/01/2021   11:31 AM  CBC  WBC 4.0 - 10.5 K/uL 7.3  16.8  8.5   Hemoglobin 13.0 - 17.0 g/dL 13.8  14.6  12.9   Hematocrit 39.0 - 52.0 % 39.4  42.9  39.0   Platelets 150 - 400 K/uL 202  236  215    .    Latest Ref Rng & Units 01/20/2022    8:21 AM 01/03/2022    9:01 AM 12/01/2021   11:31 AM  CMP  Glucose 70 - 99 mg/dL 182  168  92   BUN 6 - 20 mg/dL '11  13  11   ' Creatinine 0.61 - 1.24 mg/dL 0.76  0.90  0.70   Sodium 135 - 145 mmol/L 139  138  139   Potassium 3.5 - 5.1 mmol/L 3.8  3.9  3.9   Chloride 98 - 111 mmol/L 109  106  109   CO2 22 - 32 mmol/L '23  23  24   ' Calcium 8.9 - 10.3 mg/dL 9.3  9.8  9.4   Total Protein 6.5 - 8.1 g/dL 7.2   7.3   Total Bilirubin 0.3 - 1.2 mg/dL 0.6   0.7   Alkaline Phos 38 - 126 U/L 92   98   AST 15 - 41 U/L 16   12   ALT 0 - 44 U/L 17   15     Lab Results  Component Value Date   IRON 78 07/31/2021   TIBC 420 07/31/2021   IRONPCTSAT 19 07/31/2021   (Iron and TIBC)  Lab Results  Component Value Date   FERRITIN 142 07/31/2021     SURGICAL PATHOLOGY   THIS IS AN ADDENDUM REPORT   CASE: [id number]   PATIENT: [facility]  Surgical Pathology Report  Addendum    Reason for Addendum #1:  DNA Mismatch Repair IHC Results   Clinical History: Fournier's Gangrene; Cancer ? ([facility])      FINAL MICROSCOPIC DIAGNOSIS:   A. IRRIGATION AND DEBRIDEMENT OF SCROTUM AND PENIS:  Acute necrotizing inflammation extensively involving fascia    B. Rectal wall biopsies:  Adenocarcinoma of the rectum        RADIOGRAPHIC STUDIES: I have personally reviewed the radiological images as listed and agreed with the findings in the report. No results found.   ASSESSMENT & PLAN:   James Coffey is a 51 y.o. male with   1.  Oligometastatic rectal cancer with 2 visible liver metastases solitary lesion in the right upper lobe of the lung.-newly noted on CT chest abdomen pelvis. KRAS mutated MMR IHC normal  2.  Initially diagnosed locally Advanced Rectal Adenocarcinoma, c(T3c, N2), mismatch repair protein normal -presented to ED on 11/29/20 with septic shock secondary to Fournier's gangrene -CT AP showed findings suspicious for necrotizing perineal soft tissue infection with irregular presacral nodule. Rectal biopsies obtained on 11/30/20 confirmed rectal adenocarcinoma. -Baseline CEA on 12/09/20 was elevated at 16.7. -He underwent diagnostic laparoscopy and open descending loop colostomy on 12/13/20 with Dr. Harlow Asa. -Chest CT on 12/14/20 was negative for metastatic disease. -Repeat CEA on 01/06/21 showed further elevation to 52.55. -staging pelvis MRI on 01/12/21 showed: rectal adenocarcinoma T stage: At least T3c, possible involvement of prostatic apex; N stage: N2; long segment rectal tumor, involving anal sphincters. -s/p neoadjuvant FOLFOX chemotherapy S/p resection of liver met and thermal ablation of 2nd liver met Currently completing Chemo radiation of rectal mass Plan for SBRT of isolated lung lesion.  3.  Iron deficiency anemia now resolved . Lab Results  Component Value Date   IRON 78 07/31/2021    TIBC 420 07/31/2021   IRONPCTSAT 19 07/31/2021   (Iron and TIBC)  Lab Results  Component Value Date   FERRITIN 142  07/31/2021   4 Insomnia -lorazepam prn  5. Recurrent PERIANAL ABSCESS -resolved at this time  PLAN Patient notes no acute new symptoms since his last clinic visit no significant primary toxicities remaining at this time from his chemoradiation.  He continues to gain weight appropriately. -Labs done today were discussed in detail with the patient CEA levels down to 2.36 down from 4.67. -He is keen to proceed with aggressive treatment and is scheduled for his robotic abdominoperineal resection on 02/04/2022. -All of his questions were answered in details. -We discussed getting a PET CT scan about 6 weeks or so after his surgery to reevaluate the status of his residual disease or any progressive disease. PET/CT will also evaluate for any FDG avid pulmonary nodules that remain for consideration of additional SBRT for isolated lesions.  Follow-up PET/CT in 7 weeks Return to clinic with Dr. Irene Limbo with labs in 8 weeks  The total time spent in the appointment was 25 minutes*.  All of the patient's questions were answered with apparent satisfaction. The patient knows to call the clinic with any problems, questions or concerns.   Sullivan Lone MD MS AAHIVMS [facility] Hematology/Oncology Physician [facility]  .*Total Encounter Time as defined by the Centers for Medicare and Medicaid Services includes, in addition to the face-to-face time of a patient visit (documented in the note above) non-face-to-face time: obtaining and reviewing outside history, ordering and reviewing medications, tests or procedures, care coordination (communications with other health care professionals or caregivers) and documentation in the medical record.

## 2022-01-26 NOTE — Progress Notes (Incomplete)
[place]   [phone] [phone]   Clinic Follow up Note   Patient Care Team: Patient, No Pcp Per as PCP - General ([place]) [name], MD as Consulting Physician (Urology) [name], MD as Consulting Physician (Gastroenterology) [name], MD as Consulting Physician (Colon and Rectal Surgery) [name], MD as Consulting Physician (Oncology) Royston Bake, RN as Oncology Nurse Navigator (Oncology)  Date of Service:01/20/2022  CHIEF COMPLAINT:  Follow-up for continued evaluation and management of oligometastatic rectal cancer  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[place])  12/17/2020 Initial Diagnosis   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[place])   01/13/2021 Cancer Staging   Staging form: Colon and Rectum, AJCC 8th Edition - Clinical stage from 01/13/2021: Stage IIIB (cT3, cN2a, cM0) - Signed by [name], MD on 01/19/2021 Stage prefix: Initial diagnosis Histologic grade (G): G2 Histologic grading system: 4 grade system   01/20/2021 - 03/26/2021 Chemotherapy   Patient is on Treatment Plan : RECTAL Xelox ([place]) q21d x 6 cycles     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 ([place])  05/07/2021 Initial Diagnosis   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 ([place])   05/22/2021 -  Chemotherapy   Patient is on Treatment Plan : PANCREAS Modified FOLFIRINOX q14d x 12 cycles      Previous treatment -4 cycles of XELOX (August to October 2022) -4 cycles of FOLFIRINOX (December 2022 through February 2023) -FOLFIRI x 2 cycles --pending surgery.   INTERVAL HISTORY:   James Coffey is a 51 y.o. male here for continued management of his oligometastatic rectal cancer.  No acute new symptoms since his last clinic visit.  Good p.o. intake with no gain in weight from 173 pounds to 180 pounds in the last 3 months. No notable  toxicities from his rectal radiation or chemotherapy at this time.   MEDICAL HISTORY:  Past Medical History:  Diagnosis Date  . Cancer ([place])    rectal cancer with mets to liver  . Tobacco abuse     SURGICAL HISTORY: Past Surgical History:  Procedure Laterality Date  . HERNIA REPAIR    . INCISION AND DRAINAGE ABSCESS N/A 12/01/2020   Procedure: INCISION AND DRAINAGE ABSCESS;  Surgeon: Michael Boston, MD;  Location: [facility];  Service: General;  Laterality: N/A;  . INCISION AND DRAINAGE ABSCESS N/A 12/04/2020   Procedure: INCISION AND DRAINAGE FOURNIERS GANGRENE;  Surgeon: [name], MD;  Location: [facility];  Service: General;  Laterality: N/A;  . INCISION AND DRAINAGE PERIRECTAL ABSCESS N/A 11/30/2020   Procedure: INCISION AND DEBRIDEMENT OF PERIRECTAL ABSCESS AND RECTAL WALL BIOPSIES;  Surgeon: Michael Boston, MD;  Location: [facility];  Service: General;  Laterality: N/A;  . INGUINAL HERNIA REPAIR Right 2007  . IR IMAGING GUIDED PORT INSERTION  05/07/2021  . IR RADIOLOGIST EVAL & MGMT  08/27/2021  . IR RADIOLOGIST EVAL & MGMT  09/12/2021  . IR RADIOLOGIST EVAL & MGMT  11/10/2021  . IR RADIOLOGIST EVAL & MGMT  01/05/2022  . LAPAROSCOPIC LOOP COLOSTOMY N/A 12/13/2020   Procedure: DIAGNOSTIC LAPAROSCOPY WITH  OPEN DESCENDING LOOP COLOSTOMY;  Surgeon: Armandina Gemma, MD;  Location: [facility];  Service: General;  Laterality: N/A;  . LAPAROSCOPY N/A 08/28/2021   Procedure: LAPAROSCOPY DIAGNOSTIC;  Surgeon: Dwan Bolt, MD;  Location: [facility];  Service: General;  Laterality: N/A;  . OPEN PARTIAL HEPATECTOMY  Left 08/28/2021   Procedure: OPEN LEFT LATERAL HEPATECTOMY WITH INTRAOPERATIVE ULTRASOUND;  Surgeon: Dwan Bolt, MD;  Location: [facility];  Service: General;  Laterality: Left;  . RADIOLOGY WITH ANESTHESIA N/A 10/08/2021   Procedure: MICROWAVE ABLATION;  Surgeon: Aletta Edouard, MD;  Location: [facility];  Service: Radiology;  Laterality: N/A;  . RECTAL SURGERY      ALLERGIES:  is allergic to  hydrocodone.  ROS 10 Point review of Systems was done is negative except as noted above.   MEDICATIONS:  Current Outpatient Medications  Medication Sig Dispense Refill  . acetaminophen (TYLENOL) 500 MG tablet Take 2 tablets (1,000 mg total) by mouth every 8 (eight) hours as needed for mild pain or fever.    . docusate sodium (COLACE) 100 MG capsule Take 1 capsule (100 mg total) by mouth 2 (two) times daily. (Patient not taking: Reported on 01/21/2022) 10 capsule 0  . ibuprofen (ADVIL) 200 MG tablet Take 400 mg by mouth every 6 (six) hours as needed for moderate pain.    [name] loperamide (IMODIUM) 2 MG capsule Take 2 tablets by mouth at onset of diarrhea, then 1 tab every 2hrs until 12hr without a BM. May take 2 tab every 4hrs at bedtime. If diarrhea recurs repeat. (Patient not taking: Reported on 09/29/2021) 30 capsule 1  . LORazepam (ATIVAN) 0.5 MG tablet Take 1 tablet by mouth every 6 hours as needed for anxiety. (Patient not taking: Reported on 01/21/2022) 30 tablet 0  . prochlorperazine (COMPAZINE) 10 MG tablet Take 1 tablet (10 mg total) by mouth every 6 (six) hours as needed for nausea or vomiting. (Patient not taking: Reported on 01/21/2022) 30 tablet 1   No current facility-administered medications for this visit.   Facility-Administered Medications Ordered in Other Visits  Medication Dose Route Frequency Provider Last Rate Last Admin  . palonosetron (ALOXI) 0.25 MG/5ML injection           . sodium chloride flush (NS) 0.9 % injection 10 mL  10 mL Intracatheter Once [name], MD        PHYSICAL EXAMINATION: ECOG PERFORMANCE STATUS: 1 - Symptomatic but completely ambulatory  Vital signs reviewed Wt Readings from Last 3 Encounters:  01/22/22 178 lb (80.7 kg)  01/20/22 180 lb 14.4 oz (82.1 kg)  12/01/21 177 lb 9.6 oz (80.6 kg)  NAD GENERAL:alert, in no acute distress and comfortable SKIN: no acute rashes, no significant lesions EYES: conjunctiva are pink and non-injected,  sclera anicteric NECK: supple, no JVD LYMPH:  no palpable lymphadenopathy in the cervical, axillary or inguinal regions LUNGS: clear to auscultation b/l with normal respiratory effort HEART: regular rate & rhythm ABDOMEN:  normoactive bowel sounds , non tender, not distended. Extremity: no pedal edema PSYCH: alert & oriented x 3 with fluent speech NEURO: no focal motor/sensory deficits  LABORATORY DATA:  .    Latest Ref Rng & Units 01/20/2022    8:21 AM 01/03/2022    9:01 AM 12/01/2021   11:31 AM  CBC  WBC 4.0 - 10.5 K/uL 7.3  16.8  8.5   Hemoglobin 13.0 - 17.0 g/dL 13.8  14.6  12.9   Hematocrit 39.0 - 52.0 % 39.4  42.9  39.0   Platelets 150 - 400 K/uL 202  236  215    .    Latest Ref Rng & Units 01/20/2022    8:21 AM 01/03/2022    9:01 AM 12/01/2021   11:31 AM  CMP  Glucose 70 - 99 mg/dL 182  168  92   BUN 6 - 20 mg/dL 11  13  11   Creatinine 0.61 - 1.24 mg/dL 0.76  0.90  0.70   Sodium 135 - 145 mmol/L 139  138  139   Potassium 3.5 - 5.1 mmol/L 3.8  3.9  3.9   Chloride 98 - 111 mmol/L 109  106  109   CO2 22 - 32 mmol/L '23  23  24   ' Calcium 8.9 - 10.3 mg/dL 9.3  9.8  9.4   Total Protein 6.5 - 8.1 g/dL 7.2   7.3   Total Bilirubin 0.3 - 1.2 mg/dL 0.6   0.7   Alkaline Phos 38 - 126 U/L 92   98   AST 15 - 41 U/L 16   12   ALT 0 - 44 U/L 17   15     Lab Results  Component Value Date   IRON 78 07/31/2021   TIBC 420 07/31/2021   IRONPCTSAT 19 07/31/2021   (Iron and TIBC)  Lab Results  Component Value Date   FERRITIN 142 07/31/2021     SURGICAL PATHOLOGY   THIS IS AN ADDENDUM REPORT   CASE: [id number]  PATIENT: [facility]  Surgical Pathology Report  Addendum    Reason for Addendum #1:  DNA Mismatch Repair IHC Results   Clinical History: Fournier's Gangrene; Cancer ? ([facility])      FINAL MICROSCOPIC DIAGNOSIS:   A. IRRIGATION AND DEBRIDEMENT OF SCROTUM AND PENIS:  Acute necrotizing inflammation extensively involving fascia    B. Rectal wall biopsies:   Adenocarcinoma of the rectum        RADIOGRAPHIC STUDIES: I have personally reviewed the radiological images as listed and agreed with the findings in the report. No results found.   ASSESSMENT & PLAN:   Jaivyn Gulla is a 51 y.o. male with   1.  Oligometastatic rectal cancer with 2 visible liver metastases solitary lesion in the right upper lobe of the lung.-newly noted on CT chest abdomen pelvis. KRAS mutated MMR IHC normal  2.  Initially diagnosed locally Advanced Rectal Adenocarcinoma, c(T3c, N2), mismatch repair protein normal -presented to ED on 11/29/20 with septic shock secondary to Fournier's gangrene -CT AP showed findings suspicious for necrotizing perineal soft tissue infection with irregular presacral nodule. Rectal biopsies obtained on 11/30/20 confirmed rectal adenocarcinoma. -Baseline CEA on 12/09/20 was elevated at 16.7. -He underwent diagnostic laparoscopy and open descending loop colostomy on 12/13/20 with Dr. Harlow Asa. -Chest CT on 12/14/20 was negative for metastatic disease. -Repeat CEA on 01/06/21 showed further elevation to 52.55. -staging pelvis MRI on 01/12/21 showed: rectal adenocarcinoma T stage: At least T3c, possible involvement of prostatic apex; N stage: N2; long segment rectal tumor, involving anal sphincters. -s/p neoadjuvant FOLFOX chemotherapy S/p resection of liver met and thermal ablation of 2nd liver met Currently completing Chemo radiation of rectal mass Plan for SBRT of isolated lung lesion.  3.  Iron deficiency anemia now resolved . Lab Results  Component Value Date   IRON 78 07/31/2021   TIBC 420 07/31/2021   IRONPCTSAT 19 07/31/2021   (Iron and TIBC)  Lab Results  Component Value Date   FERRITIN 142 07/31/2021   4 Insomnia -lorazepam prn  5. Recurrent PERIANAL ABSCESS -resolved at this time  PLAN Patient notes no acute renal symptoms since his last clinic visit -Labs done today were reviewed in detail. CBC stable. CMP stable CEA  4.67 -He reports no prohibitive toxicities with Xeloda at 625 mg per metered square at this time. He will stop this once his RT to  rectum has completed. Tolerating RT with grade 2 fatigue -He will have SBRT to his isolated lung lesion after completion of chemoradiation to the rectal primary. -Subsequently he will likely need repeat imaging prior to consideration of surgery to resect his rectal primary tumor by Dr. [name] -will deferred choice of imaging to his surgeon. -He has a f/u with Dr. [name] July 45WT for continued evaluation and management of oligometastatic rectal cancer.   Follow-up RTC with Dr Irene Limbo in 4-6 weeks with port flush and labs  The total time spent in the appointment was 30 minutes*.  All of the patient's questions were answered with apparent satisfaction. The patient knows to call the clinic with any problems, questions or concerns.   Sullivan Lone MD MS AAHIVMS [facility]  [facility] Hematology/Oncology Physician [facility]  .*Total Encounter Time as defined by the Centers for Medicare and Medicaid Services includes, in addition to the face-to-face time of a patient visit (documented in the note above) non-face-to-face time: obtaining and reviewing outside history, ordering and reviewing medications, tests or procedures, care coordination (communications with other health care professionals or caregivers) and documentation in the medical record.  I, Melene Muller, am acting as scribe for Dr. Sullivan Lone, MD.  .I have reviewed the above documentation for accuracy and completeness, and I agree with the above. [name] MD

## 2022-01-27 ENCOUNTER — Encounter: Payer: Self-pay | Admitting: Hematology

## 2022-01-27 ENCOUNTER — Ambulatory Visit
Admission: RE | Admit: 2022-01-27 | Discharge: 2022-01-27 | Disposition: A | Discharge status: Home / Self Care | Payer: Medicaid Other | Source: Ambulatory Visit | Attending: Radiation Oncology | Admitting: Radiation Oncology

## 2022-01-27 DIAGNOSIS — C20 Malignant neoplasm of rectum: Secondary | ICD-10-CM

## 2022-01-27 DIAGNOSIS — C787 Secondary malignant neoplasm of liver and intrahepatic bile duct: Secondary | ICD-10-CM

## 2022-02-03 NOTE — Anesthesia Preprocedure Evaluation (Signed)
Anesthesia Evaluation  Patient identified by MRN, date of birth, ID band Patient awake    Reviewed: Allergy & Precautions, NPO status , Patient's Chart, lab work & pertinent test results  Airway Mallampati: II  TM Distance: >3 FB Neck ROM: Full    Dental  (+) Dental Advisory Given, Lower Dentures, Upper Dentures   Pulmonary Current Smoker and Patient abstained from smoking.,    Pulmonary exam normal breath sounds clear to auscultation       Cardiovascular negative cardio ROS Normal cardiovascular exam Rhythm:Regular Rate:Normal     Neuro/Psych negative neurological ROS     GI/Hepatic Liver mets s/p resection   parastomal hernia STAGE 4 recal cancer   Endo/Other  negative endocrine ROS  Renal/GU negative Renal ROS     Musculoskeletal negative musculoskeletal ROS (+)   Abdominal   Peds  Hematology negative hematology ROS (+)   Anesthesia Other Findings   Reproductive/Obstetrics                            Anesthesia Physical Anesthesia Plan  ASA: 4  Anesthesia Plan: General   Post-op Pain Management: Tylenol PO (pre-op)* and Toradol IV (intra-op)*   Induction: Intravenous  PONV Risk Score and Plan: 2 and Midazolam, Dexamethasone and Ondansetron  Airway Management Planned: Oral ETT  Additional Equipment: Arterial line  Intra-op Plan:   Post-operative Plan: Extubation in OR  Informed Consent: I have reviewed the patients History and Physical, chart, labs and discussed the procedure including the risks, benefits and alternatives for the proposed anesthesia with the patient or authorized representative who has indicated his/her understanding and acceptance.     Dental advisory given  Plan Discussed with: CRNA  Anesthesia Plan Comments: (2nd PIV after induction )      Anesthesia Quick Evaluation

## 2022-02-04 ENCOUNTER — Other Ambulatory Visit: Payer: Self-pay

## 2022-02-04 ENCOUNTER — Inpatient Hospital Stay (HOSPITAL_COMMUNITY): Payer: Medicaid Other | Admitting: Physician Assistant

## 2022-02-04 ENCOUNTER — Inpatient Hospital Stay (HOSPITAL_COMMUNITY): Payer: Medicaid Other | Admitting: Anesthesiology

## 2022-02-04 ENCOUNTER — Inpatient Hospital Stay (HOSPITAL_COMMUNITY)
Admission: RE | Admit: 2022-02-04 | Discharge: 2022-02-08 | DRG: 330 | Disposition: A | Discharge status: Home / Self Care | Payer: Medicaid Other | Source: Ambulatory Visit | Attending: General Surgery | Admitting: General Surgery

## 2022-02-04 ENCOUNTER — Encounter (HOSPITAL_COMMUNITY): Payer: Self-pay | Admitting: General Surgery

## 2022-02-04 ENCOUNTER — Encounter (HOSPITAL_COMMUNITY)
Admission: RE | Disposition: A | Discharge status: Home / Self Care | Payer: Self-pay | Source: Ambulatory Visit | Attending: General Surgery

## 2022-02-04 DIAGNOSIS — C20 Malignant neoplasm of rectum: Secondary | ICD-10-CM | POA: Diagnosis present

## 2022-02-04 DIAGNOSIS — Z885 Allergy status to narcotic agent status: Secondary | ICD-10-CM | POA: Diagnosis not present

## 2022-02-04 DIAGNOSIS — Z923 Personal history of irradiation: Secondary | ICD-10-CM | POA: Diagnosis not present

## 2022-02-04 DIAGNOSIS — R739 Hyperglycemia, unspecified: Secondary | ICD-10-CM | POA: Diagnosis present

## 2022-02-04 DIAGNOSIS — F17210 Nicotine dependence, cigarettes, uncomplicated: Secondary | ICD-10-CM

## 2022-02-04 DIAGNOSIS — C7800 Secondary malignant neoplasm of unspecified lung: Secondary | ICD-10-CM | POA: Diagnosis present

## 2022-02-04 DIAGNOSIS — Z9221 Personal history of antineoplastic chemotherapy: Secondary | ICD-10-CM

## 2022-02-04 DIAGNOSIS — Z79899 Other long term (current) drug therapy: Secondary | ICD-10-CM | POA: Diagnosis not present

## 2022-02-04 DIAGNOSIS — C775 Secondary and unspecified malignant neoplasm of intrapelvic lymph nodes: Secondary | ICD-10-CM | POA: Diagnosis present

## 2022-02-04 DIAGNOSIS — K435 Parastomal hernia without obstruction or  gangrene: Secondary | ICD-10-CM | POA: Diagnosis present

## 2022-02-04 DIAGNOSIS — Z8505 Personal history of malignant neoplasm of liver: Secondary | ICD-10-CM

## 2022-02-04 HISTORY — PX: XI ROBOTIC ASSISTED PARASTOMAL HERNIA REPAIR: SHX6540

## 2022-02-04 SURGERY — REPAIR, HERNIA, PARASTOMAL, ROBOT-ASSISTED
Anesthesia: General

## 2022-02-04 MED ORDER — BUPIVACAINE-EPINEPHRINE 0.25% -1:200000 IJ SOLN
INTRAMUSCULAR | Status: DC | PRN
  Administered 2022-02-04: 30 mL

## 2022-02-04 MED ORDER — CHLORHEXIDINE GLUCONATE 0.12 % MT SOLN
15.0000 mL | Freq: Once | OROMUCOSAL | Status: AC
  Administered 2022-02-04: 15 mL via OROMUCOSAL

## 2022-02-04 MED ORDER — ENSURE PRE-SURGERY PO LIQD: 592.0000 mL | Freq: Once | ORAL | Status: DC

## 2022-02-04 MED ORDER — PHENYLEPHRINE HCL-NACL 20-0.9 MG/250ML-% IV SOLN
INTRAVENOUS | Status: DC | PRN
  Administered 2022-02-04: 50 ug/min via INTRAVENOUS

## 2022-02-04 MED ORDER — ACETAMINOPHEN 10 MG/ML IV SOLN
INTRAVENOUS | Status: DC | PRN
  Administered 2022-02-04: 1000 mg via INTRAVENOUS

## 2022-02-04 MED ORDER — PHENYLEPHRINE 80 MCG/ML (10ML) SYRINGE FOR IV PUSH (FOR BLOOD PRESSURE SUPPORT)
PREFILLED_SYRINGE | INTRAVENOUS | Status: DC | PRN
  Administered 2022-02-04 (×2): 80 ug via INTRAVENOUS
  Administered 2022-02-04: 160 ug via INTRAVENOUS
  Administered 2022-02-04 (×2): 80 ug via INTRAVENOUS

## 2022-02-04 MED ORDER — ENSURE SURGERY PO LIQD
237.0000 mL | Freq: Two times a day (BID) | ORAL | Status: DC
  Administered 2022-02-05 – 2022-02-07 (×2): 237 mL via ORAL

## 2022-02-04 MED ORDER — BISACODYL 5 MG PO TBEC: 20.0000 mg | DELAYED_RELEASE_TABLET | Freq: Once | ORAL | Status: DC

## 2022-02-04 MED ORDER — FENTANYL CITRATE PF 50 MCG/ML IJ SOSY
25.0000 ug | PREFILLED_SYRINGE | INTRAMUSCULAR | Status: DC | PRN
  Administered 2022-02-04 (×3): 25 ug via INTRAVENOUS

## 2022-02-04 MED ORDER — ROCURONIUM BROMIDE 10 MG/ML (PF) SYRINGE
PREFILLED_SYRINGE | INTRAVENOUS | Status: AC
  Filled 2022-02-04: qty 10

## 2022-02-04 MED ORDER — POLYETHYLENE GLYCOL 3350 17 GM/SCOOP PO POWD: 1.0000 | Freq: Once | ORAL | Status: DC

## 2022-02-04 MED ORDER — ALVIMOPAN 12 MG PO CAPS
12.0000 mg | ORAL_CAPSULE | ORAL | Status: AC
  Administered 2022-02-04: 12 mg via ORAL
  Filled 2022-02-04: qty 1

## 2022-02-04 MED ORDER — LACTATED RINGERS IR SOLN
Status: DC | PRN
  Administered 2022-02-04: 1000 mL

## 2022-02-04 MED ORDER — GABAPENTIN 300 MG PO CAPS
300.0000 mg | ORAL_CAPSULE | ORAL | Status: AC
  Administered 2022-02-04: 300 mg via ORAL
  Filled 2022-02-04: qty 1

## 2022-02-04 MED ORDER — KETAMINE HCL 10 MG/ML IJ SOLN
INTRAMUSCULAR | Status: AC
  Filled 2022-02-04: qty 1

## 2022-02-04 MED ORDER — SACCHAROMYCES BOULARDII 250 MG PO CAPS
250.0000 mg | ORAL_CAPSULE | Freq: Two times a day (BID) | ORAL | Status: DC
  Administered 2022-02-04 – 2022-02-08 (×8): 250 mg via ORAL
  Filled 2022-02-04 (×8): qty 1

## 2022-02-04 MED ORDER — BUPIVACAINE-EPINEPHRINE (PF) 0.25% -1:200000 IJ SOLN
INTRAMUSCULAR | Status: AC
  Filled 2022-02-04: qty 30

## 2022-02-04 MED ORDER — BUPIVACAINE LIPOSOME 1.3 % IJ SUSP
INTRAMUSCULAR | Status: AC
  Filled 2022-02-04: qty 20

## 2022-02-04 MED ORDER — ONDANSETRON HCL 4 MG PO TABS: 4.0000 mg | ORAL_TABLET | Freq: Four times a day (QID) | ORAL | Status: DC | PRN

## 2022-02-04 MED ORDER — SUGAMMADEX SODIUM 200 MG/2ML IV SOLN
INTRAVENOUS | Status: DC | PRN
  Administered 2022-02-04: 200 mg via INTRAVENOUS

## 2022-02-04 MED ORDER — KETOROLAC TROMETHAMINE 30 MG/ML IJ SOLN
INTRAMUSCULAR | Status: AC
  Filled 2022-02-04: qty 2

## 2022-02-04 MED ORDER — PROMETHAZINE HCL 25 MG/ML IJ SOLN: 6.2500 mg | INTRAMUSCULAR | Status: DC | PRN

## 2022-02-04 MED ORDER — LIP MEDEX EX OINT
TOPICAL_OINTMENT | CUTANEOUS | Status: DC | PRN
Start: 2022-02-04 — End: 2022-02-08
  Administered 2022-02-04: 75 via TOPICAL
  Filled 2022-02-04: qty 7

## 2022-02-04 MED ORDER — LACTATED RINGERS IV SOLN: INTRAVENOUS | Status: DC

## 2022-02-04 MED ORDER — FENTANYL CITRATE (PF) 250 MCG/5ML IJ SOLN
INTRAMUSCULAR | Status: AC
  Filled 2022-02-04: qty 5

## 2022-02-04 MED ORDER — ACETAMINOPHEN 10 MG/ML IV SOLN
INTRAVENOUS | Status: AC
  Filled 2022-02-04: qty 100

## 2022-02-04 MED ORDER — BUPIVACAINE LIPOSOME 1.3 % IJ SUSP: 20.0000 mL | Freq: Once | INTRAMUSCULAR | Status: DC

## 2022-02-04 MED ORDER — FENTANYL CITRATE PF 50 MCG/ML IJ SOSY
PREFILLED_SYRINGE | INTRAMUSCULAR | Status: AC
  Filled 2022-02-04: qty 1

## 2022-02-04 MED ORDER — ORAL CARE MOUTH RINSE: 15.0000 mL | Freq: Once | OROMUCOSAL | Status: AC

## 2022-02-04 MED ORDER — HYDROMORPHONE HCL 1 MG/ML IJ SOLN
0.5000 mg | INTRAMUSCULAR | Status: DC | PRN
  Administered 2022-02-04 – 2022-02-05 (×3): 0.5 mg via INTRAVENOUS
  Filled 2022-02-04 (×3): qty 0.5

## 2022-02-04 MED ORDER — SUCCINYLCHOLINE CHLORIDE 200 MG/10ML IV SOSY
PREFILLED_SYRINGE | INTRAVENOUS | Status: AC
  Filled 2022-02-04: qty 10

## 2022-02-04 MED ORDER — ONDANSETRON HCL 4 MG/2ML IJ SOLN
INTRAMUSCULAR | Status: DC | PRN
  Administered 2022-02-04: 4 mg via INTRAVENOUS

## 2022-02-04 MED ORDER — KETOROLAC TROMETHAMINE 30 MG/ML IJ SOLN
INTRAMUSCULAR | Status: DC | PRN
  Administered 2022-02-04: 15 mg via INTRAVENOUS

## 2022-02-04 MED ORDER — ENOXAPARIN SODIUM 40 MG/0.4ML IJ SOSY
40.0000 mg | PREFILLED_SYRINGE | INTRAMUSCULAR | Status: DC
  Administered 2022-02-05 – 2022-02-08 (×4): 40 mg via SUBCUTANEOUS
  Filled 2022-02-04 (×4): qty 0.4

## 2022-02-04 MED ORDER — BUPIVACAINE LIPOSOME 1.3 % IJ SUSP
INTRAMUSCULAR | Status: DC | PRN
  Administered 2022-02-04: 20 mL

## 2022-02-04 MED ORDER — ONDANSETRON HCL 4 MG/2ML IJ SOLN: 4.0000 mg | Freq: Four times a day (QID) | INTRAMUSCULAR | Status: DC | PRN

## 2022-02-04 MED ORDER — LACTATED RINGERS IV SOLN: INTRAVENOUS | Status: DC | PRN

## 2022-02-04 MED ORDER — ACETAMINOPHEN 500 MG PO TABS
1000.0000 mg | ORAL_TABLET | Freq: Four times a day (QID) | ORAL | Status: DC
  Administered 2022-02-04 – 2022-02-08 (×14): 1000 mg via ORAL
  Filled 2022-02-04 (×14): qty 2

## 2022-02-04 MED ORDER — SODIUM CHLORIDE 0.9 % IV SOLN
2.0000 g | INTRAVENOUS | Status: AC
  Administered 2022-02-04: 2 g via INTRAVENOUS
  Filled 2022-02-04: qty 2

## 2022-02-04 MED ORDER — ALUM & MAG HYDROXIDE-SIMETH 200-200-20 MG/5ML PO SUSP: 30.0000 mL | Freq: Four times a day (QID) | ORAL | Status: DC | PRN

## 2022-02-04 MED ORDER — MIDAZOLAM HCL 2 MG/2ML IJ SOLN
INTRAMUSCULAR | Status: AC
  Filled 2022-02-04: qty 2

## 2022-02-04 MED ORDER — GABAPENTIN 300 MG PO CAPS
300.0000 mg | ORAL_CAPSULE | Freq: Two times a day (BID) | ORAL | Status: DC
Start: 2022-02-04 — End: 2022-02-08
  Administered 2022-02-04 – 2022-02-08 (×8): 300 mg via ORAL
  Filled 2022-02-04 (×8): qty 1

## 2022-02-04 MED ORDER — KETAMINE HCL 10 MG/ML IJ SOLN
INTRAMUSCULAR | Status: DC | PRN
  Administered 2022-02-04: 35 mg via INTRAVENOUS

## 2022-02-04 MED ORDER — SODIUM CHLORIDE 0.9 % IV SOLN
2.0000 g | Freq: Two times a day (BID) | INTRAVENOUS | Status: AC
  Administered 2022-02-04: 2 g via INTRAVENOUS
  Filled 2022-02-04 (×2): qty 2

## 2022-02-04 MED ORDER — ACETAMINOPHEN 500 MG PO TABS
1000.0000 mg | ORAL_TABLET | ORAL | Status: AC
  Administered 2022-02-04: 1000 mg via ORAL
  Filled 2022-02-04: qty 2

## 2022-02-04 MED ORDER — LIDOCAINE 2% (20 MG/ML) 5 ML SYRINGE
INTRAMUSCULAR | Status: DC | PRN
  Administered 2022-02-04: 80 mg via INTRAVENOUS

## 2022-02-04 MED ORDER — PROPOFOL 10 MG/ML IV BOLUS
INTRAVENOUS | Status: AC
  Filled 2022-02-04: qty 20

## 2022-02-04 MED ORDER — ROCURONIUM BROMIDE 10 MG/ML (PF) SYRINGE
PREFILLED_SYRINGE | INTRAVENOUS | Status: DC | PRN
  Administered 2022-02-04 (×3): 30 mg via INTRAVENOUS
  Administered 2022-02-04: 10 mg via INTRAVENOUS
  Administered 2022-02-04: 70 mg via INTRAVENOUS
  Administered 2022-02-04: 20 mg via INTRAVENOUS

## 2022-02-04 MED ORDER — KCL IN DEXTROSE-NACL 20-5-0.45 MEQ/L-%-% IV SOLN
INTRAVENOUS | Status: DC
  Filled 2022-02-04: qty 1000

## 2022-02-04 MED ORDER — ALVIMOPAN 12 MG PO CAPS
12.0000 mg | ORAL_CAPSULE | Freq: Two times a day (BID) | ORAL | Status: DC
  Administered 2022-02-05 – 2022-02-07 (×5): 12 mg via ORAL
  Filled 2022-02-04 (×6): qty 1

## 2022-02-04 MED ORDER — DEXAMETHASONE SODIUM PHOSPHATE 10 MG/ML IJ SOLN
INTRAMUSCULAR | Status: DC | PRN
  Administered 2022-02-04: 5 mg via INTRAVENOUS

## 2022-02-04 MED ORDER — MIDAZOLAM HCL 2 MG/2ML IJ SOLN
INTRAMUSCULAR | Status: DC | PRN
  Administered 2022-02-04: 2 mg via INTRAVENOUS

## 2022-02-04 MED ORDER — LIDOCAINE HCL (PF) 2 % IJ SOLN
INTRAMUSCULAR | Status: AC
  Filled 2022-02-04: qty 5

## 2022-02-04 MED ORDER — 0.9 % SODIUM CHLORIDE (POUR BTL) OPTIME
TOPICAL | Status: DC | PRN
  Administered 2022-02-04: 1000 mL

## 2022-02-04 MED ORDER — HEPARIN SODIUM (PORCINE) 5000 UNIT/ML IJ SOLN
5000.0000 [IU] | Freq: Once | INTRAMUSCULAR | Status: AC
  Administered 2022-02-04: 5000 [IU] via SUBCUTANEOUS
  Filled 2022-02-04: qty 1

## 2022-02-04 MED ORDER — PROPOFOL 10 MG/ML IV BOLUS
INTRAVENOUS | Status: DC | PRN
  Administered 2022-02-04: 160 mg via INTRAVENOUS

## 2022-02-04 MED ORDER — FENTANYL CITRATE (PF) 250 MCG/5ML IJ SOLN
INTRAMUSCULAR | Status: DC | PRN
Start: 2022-02-04 — End: 2022-02-04
  Administered 2022-02-04: 100 ug via INTRAVENOUS
  Administered 2022-02-04 (×5): 50 ug via INTRAVENOUS
  Administered 2022-02-04: 100 ug via INTRAVENOUS
  Administered 2022-02-04: 50 ug via INTRAVENOUS

## 2022-02-04 MED ORDER — ENSURE PRE-SURGERY PO LIQD: 296.0000 mL | Freq: Once | ORAL | Status: DC

## 2022-02-04 SURGICAL SUPPLY — 109 items
ABDOMINAL PAD ABD IMPLANT
BAG COUNTER SPONGE SURGICOUNT (BAG) IMPLANT
BRIEF MESH DISP LRG (UNDERPADS AND DIAPERS) IMPLANT
CANNULA REDUC XI 12-8 STAPL (CANNULA) ×1
CANNULA REDUCER 12-8 DVNC XI (CANNULA) IMPLANT
CELLS DAT CNTRL 66122 CELL SVR (MISCELLANEOUS) IMPLANT
CLIP LIGATING HEM O LOK PURPLE (MISCELLANEOUS) IMPLANT
CLIP LIGATING HEMOLOK MED (MISCELLANEOUS) IMPLANT
COVER SURGICAL LIGHT HANDLE (MISCELLANEOUS) ×2 IMPLANT
COVER TIP SHEARS 8 DVNC (MISCELLANEOUS) ×1 IMPLANT
COVER TIP SHEARS 8MM DA VINCI (MISCELLANEOUS) ×1
DEFOGGER SCOPE WARMER CLEARIFY (MISCELLANEOUS) IMPLANT
DRAIN CHANNEL 19F RND (DRAIN) IMPLANT
DRAPE ARM DVNC X/XI (DISPOSABLE) ×4 IMPLANT
DRAPE COLUMN DVNC XI (DISPOSABLE) ×1 IMPLANT
DRAPE DA VINCI XI ARM (DISPOSABLE) ×4
DRAPE DA VINCI XI COLUMN (DISPOSABLE) ×1
DRAPE SURG IRRIG POUCH 19X23 (DRAPES) ×1 IMPLANT
DRSG OPSITE POSTOP 4X10 (GAUZE/BANDAGES/DRESSINGS) IMPLANT
DRSG OPSITE POSTOP 4X6 (GAUZE/BANDAGES/DRESSINGS) IMPLANT
DRSG OPSITE POSTOP 4X8 (GAUZE/BANDAGES/DRESSINGS) IMPLANT
ELECT REM PT RETURN 15FT ADLT (MISCELLANEOUS) ×1 IMPLANT
ENDOLOOP SUT PDS II  0 18 (SUTURE)
ENDOLOOP SUT PDS II 0 18 (SUTURE) IMPLANT
EVACUATOR SILICONE 100CC (DRAIN) IMPLANT
GAUZE PAD ABD 8X10 STRL (GAUZE/BANDAGES/DRESSINGS) IMPLANT
GAUZE SPONGE 4X4 12PLY STRL (GAUZE/BANDAGES/DRESSINGS) IMPLANT
GLOVE BIO SURGEON STRL SZ 6.5 (GLOVE) ×3 IMPLANT
GLOVE BIOGEL PI IND STRL 7.0 (GLOVE) ×3 IMPLANT
GLOVE BIOGEL PI INDICATOR 7.0 (GLOVE) ×3
GLOVE INDICATOR 6.5 STRL GRN (GLOVE) ×1 IMPLANT
GOWN STRL REUS W/ TWL XL LVL3 (GOWN DISPOSABLE) ×7 IMPLANT
GOWN STRL REUS W/TWL XL LVL3 (GOWN DISPOSABLE) ×7
GRASPER SUT TROCAR 14GX15 (MISCELLANEOUS) IMPLANT
HOLDER FOLEY CATH W/STRAP (MISCELLANEOUS) ×1 IMPLANT
IRRIG SUCT STRYKERFLOW 2 WTIP (MISCELLANEOUS) ×1
IRRIGATION SUCT STRKRFLW 2 WTP (MISCELLANEOUS) ×1 IMPLANT
KIT BASIN OR (CUSTOM PROCEDURE TRAY) ×1 IMPLANT
KIT PROCEDURE DA VINCI SI (MISCELLANEOUS) ×1
KIT PROCEDURE DVNC SI (MISCELLANEOUS) ×1 IMPLANT
KIT TURNOVER KIT A (KITS) IMPLANT
NDL INSUFFLATION 14GA 120MM (NEEDLE) ×1 IMPLANT
NEEDLE INSUFFLATION 14GA 120MM (NEEDLE) ×1 IMPLANT
PACK CARDIOVASCULAR III (CUSTOM PROCEDURE TRAY) ×1 IMPLANT
PACK COLON (CUSTOM PROCEDURE TRAY) ×1 IMPLANT
PACK GENERAL/GYN (CUSTOM PROCEDURE TRAY) ×1 IMPLANT
PAK SCROTO (SET/KITS/TRAYS/PACK) ×1 IMPLANT
RELOAD STAPLE 45 3.5 BLU DVNC (STAPLE) IMPLANT
RELOAD STAPLE 45 4.3 GRN DVNC (STAPLE) IMPLANT
RELOAD STAPLE 60 3.5 BLU DVNC (STAPLE) IMPLANT
RELOAD STAPLER 3.5X45 BLU DVNC (STAPLE) IMPLANT
RELOAD STAPLER 3.5X60 BLU DVNC (STAPLE) ×1 IMPLANT
RELOAD STAPLER 4.3X45 GRN DVNC (STAPLE) IMPLANT
RETRACTOR LONE STAR DISPOSABLE (INSTRUMENTS) IMPLANT
RETRACTOR WND ALEXIS 18 MED (MISCELLANEOUS) IMPLANT
RTRCTR WOUND ALEXIS 18CM MED (MISCELLANEOUS)
SCISSORS LAP 5X35 DISP (ENDOMECHANICALS) ×1 IMPLANT
SEAL CANN UNIV 5-8 DVNC XI (MISCELLANEOUS) ×3 IMPLANT
SEAL XI 5MM-8MM UNIVERSAL (MISCELLANEOUS) ×3
SEALER VESSEL DA VINCI XI (MISCELLANEOUS) ×1
SEALER VESSEL EXT DVNC XI (MISCELLANEOUS) ×1 IMPLANT
SHEARS FOC LG CVD HARMONIC 17C (MISCELLANEOUS) IMPLANT
SHEARS HARMONIC 9CM CVD (BLADE) IMPLANT
SLEEVE ADV FIXATION 5X100MM (TROCAR) IMPLANT
SOLUTION ELECTROLUBE (MISCELLANEOUS) ×1 IMPLANT
SPIKE FLUID TRANSFER (MISCELLANEOUS) ×1 IMPLANT
STAPLER 60 DA VINCI SURE FORM (STAPLE) ×1
STAPLER 60 SUREFORM DVNC (STAPLE) IMPLANT
STAPLER CANNULA SEAL DVNC XI (STAPLE) ×1 IMPLANT
STAPLER CANNULA SEAL XI (STAPLE) ×1
STAPLER RELOAD 3.5X45 BLU DVNC (STAPLE)
STAPLER RELOAD 3.5X45 BLUE (STAPLE)
STAPLER RELOAD 3.5X60 BLU DVNC (STAPLE) ×1
STAPLER RELOAD 3.5X60 BLUE (STAPLE) ×1
STAPLER RELOAD 4.3X45 GREEN (STAPLE)
STAPLER RELOAD 4.3X45 GRN DVNC (STAPLE)
STAPLER VISISTAT 35W (STAPLE) IMPLANT
SUT ETHILON 2 0 PS N (SUTURE) IMPLANT
SUT NOVA 0 T19/GS 22DT (SUTURE) IMPLANT
SUT NOVA NAB GS-21 0 18 T12 DT (SUTURE) ×2 IMPLANT
SUT PDS AB 1 TP1 96 (SUTURE) IMPLANT
SUT PROLENE 2 0 KS (SUTURE) ×1 IMPLANT
SUT SILK 2 0 (SUTURE) ×1
SUT SILK 2 0 SH CR/8 (SUTURE) ×1 IMPLANT
SUT SILK 2-0 18XBRD TIE 12 (SUTURE) ×1 IMPLANT
SUT SILK 3 0 (SUTURE) ×1
SUT SILK 3 0 SH CR/8 (SUTURE) ×1 IMPLANT
SUT SILK 3-0 18XBRD TIE 12 (SUTURE) ×1 IMPLANT
SUT V-LOC BARB 180 2/0GR6 GS22 (SUTURE)
SUT VIC AB 2-0 SH 18 (SUTURE) IMPLANT
SUT VIC AB 2-0 SH 27 (SUTURE) ×1
SUT VIC AB 2-0 SH 27X BRD (SUTURE) ×1 IMPLANT
SUT VIC AB 3-0 SH 18 (SUTURE) IMPLANT
SUT VIC AB 4-0 PS2 27 (SUTURE) ×2 IMPLANT
SUT VICRYL 0 UR6 27IN ABS (SUTURE) IMPLANT
SUT VLOC 180 0 9IN  GS21 (SUTURE) ×1
SUT VLOC 180 0 9IN GS21 (SUTURE) IMPLANT
SUTURE V-LC BRB 180 2/0GR6GS22 (SUTURE) IMPLANT
SYR 10ML ECCENTRIC (SYRINGE) ×1 IMPLANT
SYS LAPSCP GELPORT 120MM (MISCELLANEOUS)
SYS WOUND ALEXIS 18CM MED (MISCELLANEOUS)
SYSTEM LAPSCP GELPORT 120MM (MISCELLANEOUS) IMPLANT
SYSTEM WOUND ALEXIS 18CM MED (MISCELLANEOUS) IMPLANT
TOWEL OR 17X26 10 PK STRL BLUE (TOWEL DISPOSABLE) ×1 IMPLANT
TOWEL OR NON WOVEN STRL DISP B (DISPOSABLE) ×1 IMPLANT
TRAY FOLEY MTR SLVR 16FR STAT (SET/KITS/TRAYS/PACK) ×1 IMPLANT
TROCAR ADV FIXATION 5X100MM (TROCAR) ×1 IMPLANT
TUBING CONNECTING 10 (TUBING) ×1 IMPLANT
TUBING INSUFFLATION 10FT LAP (TUBING) ×1 IMPLANT

## 2022-02-04 NOTE — Op Note (Signed)
02/04/2022  2:03 PM  PATIENT:  James Coffey  51 y.o. male  Patient Care Team: Patient, No Pcp Per as PCP - General ([place]) [name], MD as Consulting Physician (Urology) [name], MD as Consulting Physician (Gastroenterology) [name], MD as Consulting Physician (Colon and Rectal Surgery) [name], MD as Consulting Physician (Oncology) Royston Bake, RN as Oncology Nurse Navigator (Oncology)  PRE-OPERATIVE DIAGNOSIS:  Stage 4 Rectal Cancer, Parastomal Hernia  POST-OPERATIVE DIAGNOSIS:  Stage 4 Rectal Cancer, Parastomal Hernia  PROCEDURE:  ROBOTIC ABDOMINAL PERINEAL RESECTION, PARASTOMAL HERNIA REPAIR, BILATERAL TAP BLOCK    Surgeon(s): [name], MD Ileana Roup, MD  ASSISTANT: Dr Dema Severin   ANESTHESIA:   local and general  EBL:275m  Total I/O In: 2200 [I.V.:2000; IV Piggyback:200] Out: 655 [Urine:305; Blood:350]  Delay start of Pharmacological VTE agent (>24hrs) due to surgical blood loss or risk of bleeding:  no  DRAINS: (33F) Jackson-Pratt drain(s) with closed bulb suction in the pelvis    SPECIMEN:  Source of Specimen:  Rectum and Sigmoid, Piecemeal Anal Resection  DISPOSITION OF SPECIMEN:  PATHOLOGY  COUNTS:  YES  PLAN OF CARE: Admit to inpatient   PATIENT DISPOSITION:  PACU - hemodynamically stable.  INDICATION:    51y.o. M with stage 4 rectal cancer, s/p liver resection, chemotherapy and radiation.  I recommended abdominal perineal resection:  The anatomy & physiology of the digestive tract was discussed.  The pathophysiology was discussed.  Natural history risks without surgery was discussed.   I worked to give an overview of the disease and the frequent need to have multispecialty involvement.  I feel the risks of no intervention will lead to serious problems that outweigh the operative risks; therefore, I recommended a partial colectomy to remove the pathology.  Laparoscopic & open techniques were  discussed.   Risks such as bleeding, infection, abscess, leak, reoperation, possible ostomy, hernia, heart attack, death, and other risks were discussed.  I noted a good likelihood this will help address the problem.   Goals of post-operative recovery were discussed as well.    The patient expressed understanding & wished to proceed with surgery.  OR FINDINGS:   Patient had a significantly fibrotic pelvis  DESCRIPTION:   Informed consent was confirmed.  The patient underwent general anaesthesia without difficulty.  The patient was positioned appropriately.  VTE prevention in place.  The patient's abdomen was clipped, prepped, & draped in a sterile fashion.  Surgical timeout confirmed our plan.  The patient was positioned in reverse Trendelenburg.  Abdominal entry was gained using a Varies needle in the LUQ.  Entry was clean.  I induced carbon dioxide insufflation.  An 858mrobotic port was placed in the RUQ and RLQ.  Adhesions were taken down bluntly.  Camera inspection revealed no injury.  Extra ports were carefully placed under direct laparoscopic visualization.  I laparoscopically reflected the greater omentum and the upper abdomen the small bowel in the upper abdomen. The patient was appropriately positioned and the robot was docked to the patient's left side.  Instruments were placed under direct visualization.    I mobilized the sigmoid colon off of the pelvic sidewall.  I scored the base of peritoneum of the right side of the mesentery of the left colon from the ligament of Treitz to the peritoneal reflection of the mid rectum.  The patient had a medialized right ureter.  I elevated the sigmoid mesentery and enetered into the retro-mesenteric plane. We were able to identify  the right and left ureters and gonadal vessels. We kept those posterior within the retroperitoneum and elevated the left colon mesentery off that. I did isolated IMA pedicle but did not ligate it yet.  I continued distally  and got into the avascular plane posterior to the mesorectum. This allowed me to help mobilize the rectum as well by freeing the mesorectum off the sacrum.  I mobilized the peritoneal coverings towards the peritoneal reflection on both the right and left sides of the rectum.  I could see the right and left ureters and stayed away from them.    I skeletonized the inferior mesenteric artery pedicle.  I went down to its takeoff from the aorta.  After confirming the left ureter was out of the way, I went ahead and ligated the inferior mesenteric artery pedicle with bipolar robotic vessel sealer ~2cm above its takeoff from the aorta.  We ensured hemostasis.  We continued our dissection down into the pelvis posteriorly.  There was significant fibrosis noted in the posterior pelvis.  We were able to dissect this with the robotic vessel sealer.  I came up the lateral planes using the robotic vessel sealer as well.  We then attempted an anterior resection by dividing the peritoneal reflection.  There was significant scar tissue in this area and we had to switch to robotic scissors.  Carefully separated the anterior rectal plane from the prostate.  I continued down circumferentially as far as I can go with the robotic scissors.  We then switched back to the robotic vessel sealer and continued down through the pelvic floor.  Once I was past the level of the tumor, I switched to a perineal approach.  An incision was made around the anal canal using electrocautery.  A [place] retractor was used to expose the area.  I continued down posteriorly to the level of the coccyx and entered into the pelvic floor.  I then continued the dissection laterally.  There was significant scar tissue in the right anterior pelvic floor that was difficult to resect with electrocautery.  The robot was redocked.  The remaining pelvic floor was divided under direct visualization of the robot.  The right seminal vesicle was also taken due to  involvement in the scar tissue.  I then divided the mesentery up to the level of the ostomy and resected the distal colon using the robotic blue load stapler.  Once the colon distally was entirely free it was removed from the pelvis and sent to pathology for further examination.  The peritoneal floor was closed with a running 0 V-lock suture.  Due to the medialization of the right ureter I performed a right ureterolysis to evaluate for any injury.  I traced this back as far as I could into the pelvis to the level of the base of the bladder.  There did not appear to be any injury or transection.  At this point, 73 [place] Blake drain was placed into the pelvis and brought out through the 8 mm port site.  This was secured into position with a 2-0 nylon suture.  The peritoneum was then closed using interrupted 2-0 Vicryl erupted sutures to close the pelvic floor as well as the subcutaneous tissue and dermal layer.  A dressing was applied.  We then turned our attention to the patient's stoma and left upper quadrant.  The 12 mm port in the left upper quadrant was closed under laparoscopic visualization with a PMI suture passer.  The remaining ports  were removed and closed using interrupted 4-0 Vicryl sutures and Dermabond.  I then divided the ostomy at the mucocutaneous junction using electrocautery.  Dissection was carried down through the subcutaneous layers and the ostomy was separated from the fascia.  The loop of the ostomy was removed and this was converted to an end ostomy.  The patient had an 8 x 8 cm parastomal hernia.  Due to the contamination of the case I felt that primary repair was the best option.  The hernia sac was divided and the fascial edges were identified.  #1 Novafil interrupted sutures were used to close the defect in a vertical fashion above and below the ostomy to approximately 1 fingerbreadth between the ostomy and the closure.  The dermis was closed in a keyhole fashion with interrupted 2-0  Vicryl sutures.  The ostomy was matured in standard [place] fashion also with 2-0 Vicryl sutures.  An ostomy appliance was placed.  The patient was then awakened from anesthesia and sent to the postanesthesia care unit in stable condition.  All counts were correct per operating room staff. An MD assistant was necessary for tissue manipulation, retraction and positioning due to the complexity of the case and hospital policies   Rosario Adie, MD  Colorectal and Coal Surgery

## 2022-02-04 NOTE — Anesthesia Procedure Notes (Signed)
Procedure Name: Intubation Date/Time: 02/04/2022 9:02 AM  Performed by: Lollie Sails, CRNAPre-anesthesia Checklist: Patient identified, Emergency Drugs available, Suction available, Patient being monitored and Timeout performed Patient Re-evaluated:Patient Re-evaluated prior to induction Oxygen Delivery Method: Circle system utilized Preoxygenation: Pre-oxygenation with 100% oxygen Induction Type: IV induction Ventilation: Mask ventilation without difficulty Laryngoscope Size: Miller and 3 Grade View: Grade I Tube type: Oral Tube size: 7.5 mm Number of attempts: 1 Airway Equipment and Method: Stylet Placement Confirmation: ETT inserted through vocal cords under direct vision, positive ETCO2 and breath sounds checked- equal and bilateral Secured at: 22 cm Tube secured with: Tape Dental Injury: Teeth and Oropharynx as per pre-operative assessment  Comments: Upper partial noted after intubation and removed intact - given to RN.

## 2022-02-04 NOTE — Transfer of Care (Signed)
Immediate Anesthesia Transfer of Care Note  Patient: James Coffey  Procedure(s) Performed: ROBOTIC ABDOMINAL PERINEAL RESECTION, PARASTOMAL HERNIA REPAIR, BILATERAL TAP BLOCK  Patient Location: PACU  Anesthesia Type:General  Level of Consciousness: awake, alert  and oriented  Airway & Oxygen Therapy: Patient Spontanous Breathing and Patient connected to face mask oxygen  Post-op Assessment: Report given to RN and Post -op Vital signs reviewed and stable  Post vital signs: Reviewed and stable  Last Vitals:  Vitals Value Taken Time  BP 144/101 02/04/22 1420  Temp    Pulse 87 02/04/22 1424  Resp 13 02/04/22 1424  SpO2 100 % 02/04/22 1424  Vitals shown include unvalidated device data.  Last Pain:  Vitals:   02/04/22 7331  TempSrc: Oral  PainSc:       Patients Stated Pain Goal: 4 (25/08/71 9941)  Complications: No notable events documented.

## 2022-02-04 NOTE — Anesthesia Postprocedure Evaluation (Signed)
Anesthesia Post Note  Patient: Audiological scientist  Procedure(s) Performed: ROBOTIC ABDOMINAL PERINEAL RESECTION, PARASTOMAL HERNIA REPAIR, BILATERAL TAP BLOCK     Patient location during evaluation: PACU Anesthesia Type: General Level of consciousness: awake and alert Pain management: pain level controlled Vital Signs Assessment: post-procedure vital signs reviewed and stable Respiratory status: spontaneous breathing, nonlabored ventilation, respiratory function stable and patient connected to nasal cannula oxygen Cardiovascular status: blood pressure returned to baseline and stable Postop Assessment: no apparent nausea or vomiting Anesthetic complications: no   No notable events documented.  Last Vitals:  Vitals:   02/04/22 1553 02/04/22 1710  BP: 120/74 116/76  Pulse: 73 75  Resp: 18 18  Temp: (!) 36.4 C 36.9 C  SpO2: 99% 95%    Last Pain:  Vitals:   02/04/22 1710  TempSrc: Oral  PainSc:                  Santa Lighter

## 2022-02-04 NOTE — Anesthesia Procedure Notes (Signed)
Arterial Line Insertion Start/End8/30/2023 7:50 AM, 02/04/2022 8:00 AM Performed by: Lollie Sails, CRNA, CRNA  Patient location: Pre-op. Preanesthetic checklist: patient identified, IV checked, site marked, risks and benefits discussed, surgical consent, monitors and equipment checked, pre-op evaluation, timeout performed and anesthesia consent Lidocaine 1% used for infiltration Left, radial was placed Catheter size: 20 G Hand hygiene performed  and maximum sterile barriers used   Attempts: 1 Procedure performed without using ultrasound guided technique. Following insertion, dressing applied and Biopatch. Post procedure assessment: normal and unchanged  Patient tolerated the procedure well with no immediate complications.

## 2022-02-04 NOTE — H&P (Signed)
PROVIDER:  Monico Blitz, MD   MRN: [id number] DOB: 02/19/1971  Interval History:    51 year old male status post hospitalization last year due to necrotizing fasciitis of the perineum.  He underwent a diverting colostomy due to the extensive nature of his wounds on 12/13/20.  He underwent multiple debridements of his perineal wounds during the month of June.  During 1 of these debridements, he was noted to have a distal rectal mass.  Biopsy of this showed adenocarcinoma.  GI was consulted and ultimately diagnosed a rectal cancer.  This was determined to be stage 4 due to liver met.  Oncology was consulted and started chemotherapy.  He underwent a partial left hepatectomy in March 2023 by Dr. [name].  He then underwent percutaneous ablation of a right liver tumor in early May.  He is recovering from this.  He also finished radiation therapy on December 04, 2021.  He is here to discuss excision of his primary tumor.  This was done with his daughter interpreting for [place] [facility]: [facility]: [facility]       Past Medical History:  Diagnosis Date   Cancer [facility])      rectal cancer with mets to liver   Tobacco abuse           Past Surgical History:  Procedure Laterality Date   HERNIA REPAIR       INCISION AND DRAINAGE ABSCESS N/A 12/01/2020    Procedure: INCISION AND DRAINAGE ABSCESS;  Surgeon: Michael Boston, MD;  Location: [facility];  Service: General;  Laterality: N/A;   INCISION AND DRAINAGE ABSCESS N/A 12/04/2020    Procedure: INCISION AND DRAINAGE FOURNIERS GANGRENE;  Surgeon: [name], MD;  Location: [facility];  Service: General;  Laterality: N/A;   INCISION AND DRAINAGE PERIRECTAL ABSCESS N/A 11/30/2020    Procedure: INCISION AND DEBRIDEMENT OF PERIRECTAL ABSCESS AND RECTAL WALL BIOPSIES;  Surgeon: Michael Boston, MD;  Location: [facility];  Service: General;  Laterality: N/A;   INGUINAL HERNIA REPAIR Right 2007   IR IMAGING GUIDED PORT INSERTION   05/07/2021   IR RADIOLOGIST EVAL & MGMT    08/27/2021   IR RADIOLOGIST EVAL & MGMT   09/12/2021   IR RADIOLOGIST EVAL & MGMT   11/10/2021   LAPAROSCOPIC LOOP COLOSTOMY N/A 12/13/2020    Procedure: DIAGNOSTIC LAPAROSCOPY WITH  OPEN DESCENDING LOOP COLOSTOMY;  Surgeon: Armandina Gemma, MD;  Location: [facility];  Service: General;  Laterality: N/A;   LAPAROSCOPY N/A 08/28/2021    Procedure: LAPAROSCOPY DIAGNOSTIC;  Surgeon: Dwan Bolt, MD;  Location: [place];  Service: General;  Laterality: N/A;   OPEN PARTIAL HEPATECTOMY  Left 08/28/2021    Procedure: OPEN LEFT LATERAL HEPATECTOMY WITH INTRAOPERATIVE ULTRASOUND;  Surgeon: Dwan Bolt, MD;  Location: [place];  Service: General;  Laterality: Left;   RADIOLOGY WITH ANESTHESIA N/A 10/08/2021    Procedure: MICROWAVE ABLATION;  Surgeon: Aletta Edouard, MD;  Location: [facility];  Service: Radiology;  Laterality: N/A;   RECTAL SURGERY             Family History  Problem Relation Age of Onset   CAD Neg Hx     Inflammatory bowel disease Neg Hx      Social History         Socioeconomic History   Marital status: Married      Spouse name: Not on file   Number of children: Not on file   Years of education: Not on file   Highest  education level: Not on file  Occupational History   Occupation: truck driver  Tobacco Use   Smoking status: Every Day      Packs/day: 0.25      Types: Cigarettes   Smokeless tobacco: Never  Vaping Use   Vaping Use: Never used  Substance and Sexual Activity   Alcohol use: No   Drug use: No   Sexual activity: Never  Other Topics Concern   Not on file  Social History Narrative    From [place].  Came to the [place] in 2000.    Social Determinants of Health    Financial Resource Strain: Not on file  Food Insecurity: Not on file  Transportation Needs: Not on file  Physical Activity: Not on file  Stress: Not on file  Social Connections: Not on file  Intimate Partner Violence: Not on file    Review of Systems -              Negative except as stated above      Current Outpatient Medications:    acetaminophen (TYLENOL) 500 MG tablet, Take 2 tablets (1,000 mg total) by mouth every 8 (eight) hours as needed for mild pain or fever., Disp: , Rfl:    amoxicillin-clavulanate (AUGMENTIN) 875-125 MG tablet, Take 1 tablet by mouth 2 (two) times daily., Disp: 20 tablet, Rfl: 0   docusate sodium (COLACE) 100 MG capsule, Take 1 capsule (100 mg total) by mouth 2 (two) times daily., Disp: 10 capsule, Rfl: 0   ibuprofen (ADVIL) 200 MG tablet, Take 400 mg by mouth every 6 (six) hours as needed for moderate pain., Disp: , Rfl:    loperamide (IMODIUM) 2 MG capsule, Take 2 tablets by mouth at onset of diarrhea, then 1 tab every 2hrs until 12hr without a BM. May take 2 tab every 4hrs at bedtime. If diarrhea recurs repeat. (Patient not taking: Reported on 09/29/2021), Disp: 30 capsule, Rfl: 1   LORazepam (ATIVAN) 0.5 MG tablet, Take 1 tablet by mouth every 6 hours as needed for anxiety., Disp: 30 tablet, Rfl: 0   oxyCODONE (OXY IR/ROXICODONE) 5 MG immediate release tablet, Take 1 tablet (5 mg total) by mouth every 6 (six) hours as needed for moderate pain., Disp: 20 tablet, Rfl: 0   prochlorperazine (COMPAZINE) 10 MG tablet, Take 1 tablet (10 mg total) by mouth every 6 (six) hours as needed for nausea or vomiting., Disp: 30 tablet, Rfl: 1 No current facility-administered medications for this visit.   Facility-Administered Medications Ordered in Other Visits:    palonosetron (ALOXI) 0.25 MG/5ML injection, , , ,    sodium chloride flush (NS) 0.9 % injection 10 mL, 10 mL, Intracatheter, Once, [name], MD         Allergies  Allergen Reactions   Hydrocodone Itching    Physical Examination:    Vitals:   02/04/22 0712  BP: 106/70  Pulse: 71  Resp: 15  Temp: 97.6 F (36.4 C)  SpO2: 97%    Gen: NAD CV: RRR Lungs: CTA Abd: soft Ostomy pink and viable   Most recent abdominal CT in November 2022 shows decrease in rectal tumor and soft adrenal nodule in  the presacral space.   MRI of the pelvis completed in February 2023 shows considerable decrease in bulkiness of low rectal tumor with fibrosis.  Levator ani involvement was noted along with involvement of the anal sphincter.   Last CEA was 4.6. Assessment and Plan:    James Coffey is a 51 y.o.  male who underwent multiple debridements due to peroneal gangrene.  He was diagnosed with stage IV rectal cancer.  He has completed a hepatectomy and liver ablation in order to eradicate his known metastatic disease.  He has now completed radiation therapy to his rectum.  In my shows some invasion into the proximal sphincter complex.  I have recommended a robotic assisted abdominal perineal resection.  Patient also has a large parastomal hernia.  We discussed revising his ostomy and reinforcing his closure with absorbable mesh to hopefully help prevent further hernia recurrences.  Patient is agreeable to this.   The surgery and anatomy were described to the patient as well as the risks of surgery and the possible complications.  These include: Bleeding, deep abdominal infections and possible wound complications such as hernia and infection, damage to adjacent structures, leak of surgical connections, which can lead to other surgeries and possibly an ostomy, possible need for other procedures, such as abscess drains in radiology, possible prolonged hospital stay, possible diarrhea from removal of part of the colon, possible constipation from narcotics, possible bowel, bladder or sexual dysfunction if having rectal surgery, prolonged fatigue/weakness or appetite loss, possible early recurrence of of disease, possible complications of their medical problems such as heart disease or arrhythmias or lung problems, death (less than 1%). I believe the patient understands and wishes to proceed with the surgery.  Diagnoses and all orders for this visit:   Rectal cancer (CMS-HCC)     Rosario Adie, MD   Colorectal and  [facility]

## 2022-02-05 ENCOUNTER — Encounter (HOSPITAL_COMMUNITY): Payer: Self-pay | Admitting: General Surgery

## 2022-02-05 LAB — BASIC METABOLIC PANEL
Anion gap: 4 — ABNORMAL LOW (ref 5–15)
BUN: 12 mg/dL (ref 6–20)
CO2: 23 mmol/L (ref 22–32)
Calcium: 8.2 mg/dL — ABNORMAL LOW (ref 8.9–10.3)
Chloride: 109 mmol/L (ref 98–111)
Creatinine, Ser: 0.77 mg/dL (ref 0.61–1.24)
GFR, Estimated: >60 mL/min (ref >60)
Glucose, Bld: 185 mg/dL — ABNORMAL HIGH (ref 70–99)
Potassium: 4.4 mmol/L (ref 3.5–5.1)
Sodium: 136 mmol/L (ref 135–145)

## 2022-02-05 LAB — GLUCOSE, CAPILLARY
Glucose-Capillary: 206 mg/dL — ABNORMAL HIGH (ref 70–99)
Glucose-Capillary: 168 mg/dL — ABNORMAL HIGH (ref 70–99)
Glucose-Capillary: 137 mg/dL — ABNORMAL HIGH (ref 70–99)
Glucose-Capillary: 116 mg/dL — ABNORMAL HIGH (ref 70–99)

## 2022-02-05 LAB — CBC
HCT: 32.5 % — ABNORMAL LOW (ref 39.0–52.0)
Hemoglobin: 10.7 g/dL — ABNORMAL LOW (ref 13.0–17.0)
MCH: 31.8 pg (ref 26.0–34.0)
MCHC: 32.9 g/dL (ref 30.0–36.0)
MCV: 96.7 fL (ref 80.0–100.0)
Platelets: 187 10*3/uL (ref 150–400)
RBC: 3.36 MIL/uL — ABNORMAL LOW (ref 4.22–5.81)
RDW: 14.7 % (ref 11.5–15.5)
WBC: 11.2 10*3/uL — ABNORMAL HIGH (ref 4.0–10.5)
nRBC: 0 % (ref 0.0–0.2)

## 2022-02-05 MED ORDER — OXYCODONE HCL 5 MG PO TABS
5.0000 mg | ORAL_TABLET | ORAL | Status: DC | PRN
  Administered 2022-02-05 – 2022-02-07 (×5): 10 mg via ORAL
  Administered 2022-02-08: 5 mg via ORAL
  Filled 2022-02-05 (×5): qty 2
  Filled 2022-02-05: qty 1

## 2022-02-05 MED ORDER — LORAZEPAM 0.5 MG PO TABS
0.5000 mg | ORAL_TABLET | Freq: Four times a day (QID) | ORAL | Status: DC | PRN
  Administered 2022-02-05 – 2022-02-06 (×2): 0.5 mg via ORAL
  Filled 2022-02-05 (×2): qty 1

## 2022-02-05 MED ORDER — INSULIN ASPART 100 UNIT/ML IJ SOLN
0.0000 [IU] | Freq: Three times a day (TID) | INTRAMUSCULAR | Status: DC
  Administered 2022-02-05: 3 [IU] via SUBCUTANEOUS
  Administered 2022-02-05: 4 [IU] via SUBCUTANEOUS
  Administered 2022-02-05: 7 [IU] via SUBCUTANEOUS
  Administered 2022-02-06 (×2): 4 [IU] via SUBCUTANEOUS
  Administered 2022-02-07: 3 [IU] via SUBCUTANEOUS
  Administered 2022-02-07: 4 [IU] via SUBCUTANEOUS
  Administered 2022-02-07: 3 [IU] via SUBCUTANEOUS
  Administered 2022-02-08: 4 [IU] via SUBCUTANEOUS

## 2022-02-05 MED ORDER — INSULIN ASPART 100 UNIT/ML IJ SOLN: 0.0000 [IU] | Freq: Every day | INTRAMUSCULAR | Status: DC

## 2022-02-05 NOTE — Progress Notes (Signed)
  Transition of Care (TOC) Screening Note   Patient Details  Name: James Coffey Date of Birth: 22-Apr-1971   Transition of Care [facility]) CM/SW Contact:    Lennart Pall, LCSW Phone Number: 02/05/2022, 1:02 PM    Transition of Care Department [facility]) has reviewed patient and no TOC needs have been identified at this time. We will continue to monitor patient advancement through interdisciplinary progression rounds. If new patient transition needs arise, please place a TOC consult.

## 2022-02-05 NOTE — Progress Notes (Signed)
1 Day Post-Op Robotic APR, parastomal hernia repair Subjective: No acute issues, tolerating fulls, pain ok  Objective: Vital signs in last 24 hours: Temp:  [97.5 F (36.4 C)-98.9 F (37.2 C)] 98.2 F (36.8 C) (08/31 0547) Pulse Rate:  [72-89] 79 (08/31 0547) Resp:  [9-18] 16 (08/31 0547) BP: (108-144)/(70-101) 125/74 (08/31 0547) SpO2:  [93 %-100 %] 96 % (08/31 0547) Arterial Line BP: (124-140)/(68-73) 125/69 (08/30 1515)   Intake/Output from previous day: 08/30 0701 - 08/31 0700 In: 4622.3 [P.O.:960; I.V.:3462.3; IV Piggyback:200] Out: 2945 [Urine:2135; Drains:460; Blood:350] Intake/Output this shift: No intake/output data recorded.   General appearance: alert and cooperative GI: soft, moderate distention, ostomy viable with air in bag  JP: SS fluid  Incision: no significant drainage  Lab Results:  Recent Labs    02/05/22 0457  WBC 11.2*  HGB 10.7*  HCT 32.5*  PLT 187   BMET Recent Labs    02/05/22 0457  NA 136  K 4.4  CL 109  CO2 23  GLUCOSE 185*  BUN 12  CREATININE 0.77  CALCIUM 8.2*   PT/INR No results for input(s): "LABPROT", "INR" in the last 72 hours. ABG No results for input(s): "PHART", "HCO3" in the last 72 hours.  Invalid input(s): "PCO2", "PO2"  MEDS, Scheduled  acetaminophen  1,000 mg Oral Q6H   alvimopan  12 mg Oral BID   enoxaparin (LOVENOX) injection  40 mg Subcutaneous Q24H   feeding supplement  237 mL Oral BID BM   gabapentin  300 mg Oral BID   saccharomyces boulardii  250 mg Oral BID    Studies/Results: No results found.  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[place])    Malnutrition of moderate degree 12/04/2020   Rectal cancer ([place]) 12/04/2020   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

## 2022-02-06 LAB — BASIC METABOLIC PANEL
Anion gap: 7 (ref 5–15)
BUN: 9 mg/dL (ref 6–20)
CO2: 25 mmol/L (ref 22–32)
Calcium: 8.6 mg/dL — ABNORMAL LOW (ref 8.9–10.3)
Chloride: 107 mmol/L (ref 98–111)
Creatinine, Ser: 0.7 mg/dL (ref 0.61–1.24)
GFR, Estimated: >60 mL/min (ref >60)
Glucose, Bld: 111 mg/dL — ABNORMAL HIGH (ref 70–99)
Potassium: 3.8 mmol/L (ref 3.5–5.1)
Sodium: 139 mmol/L (ref 135–145)

## 2022-02-06 LAB — CBC
HCT: 31.2 % — ABNORMAL LOW (ref 39.0–52.0)
Hemoglobin: 10.3 g/dL — ABNORMAL LOW (ref 13.0–17.0)
MCH: 32.1 pg (ref 26.0–34.0)
MCHC: 33 g/dL (ref 30.0–36.0)
MCV: 97.2 fL (ref 80.0–100.0)
Platelets: 169 10*3/uL (ref 150–400)
RBC: 3.21 MIL/uL — ABNORMAL LOW (ref 4.22–5.81)
RDW: 14.9 % (ref 11.5–15.5)
WBC: 8.8 10*3/uL (ref 4.0–10.5)
nRBC: 0 % (ref 0.0–0.2)

## 2022-02-06 LAB — GLUCOSE, CAPILLARY
Glucose-Capillary: 117 mg/dL — ABNORMAL HIGH (ref 70–99)
Glucose-Capillary: 167 mg/dL — ABNORMAL HIGH (ref 70–99)
Glucose-Capillary: 182 mg/dL — ABNORMAL HIGH (ref 70–99)
Glucose-Capillary: 129 mg/dL — ABNORMAL HIGH (ref 70–99)

## 2022-02-06 NOTE — Progress Notes (Signed)
Mobility Specialist - Progress Note     02/06/22 1457  Mobility  Activity Ambulated independently in hallway  Level of Assistance Modified independent, requires aide device or extra time  Assistive Device None  Distance Ambulated (ft) 500 ft  Activity Response Tolerated well  $Mobility charge 1 Mobility   Pt was found in bed and agreeable to mobilize. Had some coughing during ambulation and stated that it increases abdominal pain. Was left in bed with all necessities in reach.  Ferd Hibbs Mobility Specialist

## 2022-02-06 NOTE — Progress Notes (Signed)
Mobility Specialist - Progress Note     02/06/22 0920  Mobility  HOB Elevated/Bed Position Self regulated  Activity Ambulated independently in hallway  Range of Motion/Exercises Active  Level of Assistance Modified independent, requires aide device or extra time  Assistive Device None  Distance Ambulated (ft) 500 ft  Activity Response Tolerated well  $Mobility charge 1 Mobility   Pt was found in bed and agreeable to mobilize. C/o abdomin pain being at 4/10 while ambulating. At EOS returned back to bed with all necessities in reach.  Ferd Hibbs Mobility Specialist

## 2022-02-06 NOTE — Discharge Instructions (Signed)
ABDOMINAL SURGERY: POST OP INSTRUCTIONS  DIET: Follow a light bland diet the first 24 hours after arrival home, such as soup, liquids, crackers, etc.  Be sure to include lots of fluids daily.  Avoid fast food or heavy meals as your are more likely to get nauseated.  Do not eat any uncooked fruits or vegetables for the next 2 weeks as your colon heals. Take your usually prescribed home medications unless otherwise directed. PAIN CONTROL: Pain is best controlled by a usual combination of three different methods TOGETHER: Ice/Heat Over the counter pain medication Prescription pain medication Most patients will experience some swelling and bruising around the incisions.  Ice packs or heating pads (30-60 minutes up to 6 times a day) will help. Use ice for the first few days to help decrease swelling and bruising, then switch to heat to help relax tight/sore spots and speed recovery.  Some people prefer to use ice alone, heat alone, alternating between ice & heat.  Experiment to what works for you.  Swelling and bruising can take several weeks to resolve.   It is helpful to take an over-the-counter pain medication regularly for the first few weeks.  Choose one of the following that works best for you: Naproxen (Aleve, etc)  Two '220mg'$  tabs twice a day Ibuprofen (Advil, etc) Three '200mg'$  tabs four times a day (every meal & bedtime) Acetaminophen (Tylenol, etc) 500-'650mg'$  four times a day (every meal & bedtime) A  prescription for pain medication (such as oxycodone, hydrocodone, etc) should be given to you upon discharge.  Take your pain medication as prescribed.  If you are having problems/concerns with the prescription medicine (does not control pain, nausea, vomiting, rash, itching, etc), please call us [phone] to see if we need to switch you to a different pain medicine that will work better for you and/or control your side effect better. If you need a refill on your pain medication, please contact  your pharmacy.  They will contact our office to request authorization. Prescriptions will not be filled after 5 pm or on week-ends. Avoid getting constipated.  Between the surgery and the pain medications, it is common to experience some constipation.  Increasing fluid intake and taking a fiber supplement (such as Metamucil, Citrucel, FiberCon, MiraLax, etc) 1-2 times a day regularly will usually help prevent this problem from occurring.  A mild laxative (prune juice, Milk of Magnesia, MiraLax, etc) should be taken according to package directions if there are no bowel movements after 48 hours.   Watch out for diarrhea.  If you have many loose bowel movements, simplify your diet to bland foods & liquids for a few days.  Stop any stool softeners and decrease your fiber supplement.  Switching to mild anti-diarrheal medications (Kayopectate, Pepto Bismol) can help.  If this worsens or does not improve, please call us. Wash / shower every day.  You may shower over the incision / wound.  Avoid baths until the skin is fully healed.  Continue to shower over incision(s) after the dressing is off. Change your perineal dressing every day.  Wash area with soap and water daily Empty your drain daily.  You do not need to record the outputs ACTIVITIES as tolerated:   You may resume regular (light) daily activities beginning the next day--such as daily self-care, walking, climbing stairs--gradually increasing activities as tolerated.  If you can walk 30 minutes without difficulty, it is safe to try more intense activity such as jogging, treadmill, bicycling, low-impact aerobics, swimming,  etc. Save the most intensive and strenuous activity for last such as sit-ups, heavy lifting, contact sports, etc  Refrain from any heavy lifting or straining until you are off narcotics for pain control.   DO NOT PUSH THROUGH PAIN.  Let pain be your guide: If it hurts to do something, don't do it.  Pain is your body warning you to avoid  that activity for another week until the pain goes down. You may drive when you are no longer taking prescription pain medication, you can comfortably wear a seatbelt, and you can safely maneuver your car and apply brakes. You may have sexual intercourse when it is comfortable.  FOLLOW UP in our office Please call [facility] at [phone] to set up an appointment to see your surgeon in the office for a follow-up appointment approximately 1-2 weeks after your surgery. Make sure that you call for this appointment the day you arrive home to insure a convenient appointment time. 10. IF YOU HAVE DISABILITY OR FAMILY LEAVE FORMS, BRING THEM TO THE OFFICE FOR PROCESSING.  DO NOT GIVE THEM TO YOUR DOCTOR.   WHEN TO CALL us [phone]: Poor pain control Reactions / problems with new medications (rash/itching, nausea, etc)  Fever over 101.5 F (38.5 C) Inability to urinate Nausea and/or vomiting Worsening swelling or bruising Continued bleeding from incision. Increased pain, redness, or drainage from the incision  The clinic staff is available to answer your questions during regular business hours (8:30am-5pm).  Please don't hesitate to call and ask to speak to one of our nurses for clinical concerns.   A surgeon from [facility] is always on call at the hospitals   If you have a medical emergency, go to the nearest emergency room or call 911.    [facility], [place], [place], [place], [place]  [id number] ? MAIN: [phone] ? TOLL FREE: [phone] ? FAX [phone] [email or web address]

## 2022-02-06 NOTE — Progress Notes (Addendum)
Patient having trouble urinating and only urinating 100 ml at a time, refusing for RN to bladder scan or in and out cath. MD paged and made aware.

## 2022-02-06 NOTE — Plan of Care (Signed)
  Problem: Activity: Goal: Ability to tolerate increased activity will improve Outcome: Progressing   Problem: Nutritional: Goal: Will attain and maintain optimal nutritional status will improve Outcome: Progressing   Problem: Pain Managment: Goal: General experience of comfort will improve Outcome: Progressing

## 2022-02-06 NOTE — Progress Notes (Addendum)
2 Days Post-Op Robotic APR, parastomal hernia repair Subjective: No acute issues, tolerating fulls, pain better today  Objective: Vital signs in last 24 hours: Temp:  [98.5 F (36.9 C)-98.8 F (37.1 C)] 98.5 F (36.9 C) (09/01 0513) Pulse Rate:  [80-85] 80 (09/01 0513) Resp:  [16-17] 16 (09/01 0513) BP: (98-110)/(64-80) 98/72 (09/01 0513) SpO2:  [94 %-97 %] 96 % (09/01 0513) Weight:  [80.5 kg] 80.5 kg (09/01 0513)   Intake/Output from previous day: 08/31 0701 - 09/01 0700 In: 43 [P.O.:960] Out: 3105 [Urine:2900; Drains:205] Intake/Output this shift: No intake/output data recorded.   General appearance: alert and cooperative GI: soft, less distention, ostomy viable with air in bag  JP: SS fluid  Incision: no significant drainage  Lab Results:  Recent Labs    02/05/22 0457 02/06/22 0510  WBC 11.2* 8.8  HGB 10.7* 10.3*  HCT 32.5* 31.2*  PLT 187 169    BMET Recent Labs    02/05/22 0457 02/06/22 0510  NA 136 139  K 4.4 3.8  CL 109 107  CO2 23 25  GLUCOSE 185* 111*  BUN 12 9  CREATININE 0.77 0.70  CALCIUM 8.2* 8.6*    PT/INR No results for input(s): "LABPROT", "INR" in the last 72 hours. ABG No results for input(s): "PHART", "HCO3" in the last 72 hours.  Invalid input(s): "PCO2", "PO2"  MEDS, Scheduled  acetaminophen  1,000 mg Oral Q6H   alvimopan  12 mg Oral BID   enoxaparin (LOVENOX) injection  40 mg Subcutaneous Q24H   feeding supplement  237 mL Oral BID BM   gabapentin  300 mg Oral BID   insulin aspart  0-20 Units Subcutaneous TID WC   insulin aspart  0-5 Units Subcutaneous QHS   saccharomyces boulardii  250 mg Oral BID    Studies/Results: No results found.  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[place])    Malnutrition of moderate degree 12/04/2020   Rectal cancer ([place]) 12/04/2020   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

## 2022-02-07 LAB — GLUCOSE, CAPILLARY
Glucose-Capillary: 127 mg/dL — ABNORMAL HIGH (ref 70–99)
Glucose-Capillary: 134 mg/dL — ABNORMAL HIGH (ref 70–99)
Glucose-Capillary: 156 mg/dL — ABNORMAL HIGH (ref 70–99)
Glucose-Capillary: 110 mg/dL — ABNORMAL HIGH (ref 70–99)

## 2022-02-07 NOTE — Progress Notes (Signed)
3 Days Post-Op   Subjective/Chief Complaint: No complaints. Not much out ostomy yet   Objective: Vital signs in last 24 hours: Temp:  [98.2 F (36.8 C)-98.8 F (37.1 C)] 98.8 F (37.1 C) (09/02 0456) Pulse Rate:  [82-95] 82 (09/02 0456) Resp:  [16-18] 18 (09/02 0456) BP: (98-127)/(68-81) 98/68 (09/02 0456) SpO2:  [95 %-96 %] 95 % (09/02 0456) Weight:  [80 kg] 80 kg (09/02 0500) Last BM Date :  (ostomy)  Intake/Output from previous day: 09/01 0701 - 09/02 0700 In: 1743.9 [P.O.:360; I.V.:1383.9] Out: 885 [Urine:775; Drains:110] Intake/Output this shift: Total I/O In: -  Out: 230 [Urine:200; Drains:30]  General appearance: alert and cooperative Resp: clear to auscultation bilaterally Cardio: regular rate and rhythm GI: soft, mild tenderness. Small output from ostomy  Lab Results:  Recent Labs    02/05/22 0457 02/06/22 0510  WBC 11.2* 8.8  HGB 10.7* 10.3*  HCT 32.5* 31.2*  PLT 187 169   BMET Recent Labs    02/05/22 0457 02/06/22 0510  NA 136 139  K 4.4 3.8  CL 109 107  CO2 23 25  GLUCOSE 185* 111*  BUN 12 9  CREATININE 0.77 0.70  CALCIUM 8.2* 8.6*   PT/INR No results for input(s): "LABPROT", "INR" in the last 72 hours. ABG No results for input(s): "PHART", "HCO3" in the last 72 hours.  Invalid input(s): "PCO2", "PO2"  Studies/Results: No results found.  Anti-infectives: Anti-infectives (From admission, onward)    Start     Dose/Rate Route Frequency Ordered Stop   02/04/22 2200  cefoTEtan (CEFOTAN) 2 g in sodium chloride 0.9 % 100 mL IVPB        2 g 200 mL/hr over 30 Minutes Intravenous Every 12 hours 02/04/22 1555 02/04/22 2211   02/04/22 0700  cefoTEtan (CEFOTAN) 2 g in sodium chloride 0.9 % 100 mL IVPB        2 g 200 mL/hr over 30 Minutes Intravenous On call to O.R. 02/04/22 0277 02/04/22 0925       Assessment/Plan: s/p Procedure(s): ROBOTIC ABDOMINAL PERINEAL RESECTION, PARASTOMAL HERNIA REPAIR, BILATERAL TAP BLOCK (N/A) Advance  diet Ambulate  Probably ready for d/c tomorrow Continue dressing changes  LOS: 3 days    Autumn Messing III 02/07/2022

## 2022-02-07 NOTE — Progress Notes (Signed)
Medium brown soft stool output in the ostomy noted.

## 2022-02-07 NOTE — Progress Notes (Signed)
Mobility Specialist Cancellation/Refusal Note:   Reason for Cancellation/Refusal: Pt declined mobility at this time. Pt already walking IND in hall. Will check back as schedule permits.        [facility]

## 2022-02-08 LAB — GLUCOSE, CAPILLARY: Glucose-Capillary: 162 mg/dL — ABNORMAL HIGH (ref 70–99)

## 2022-02-08 MED ORDER — OXYCODONE HCL 5 MG PO TABS: 5.0000 mg | ORAL_TABLET | Freq: Four times a day (QID) | ORAL | 0 refills | Status: DC | PRN

## 2022-02-08 NOTE — TOC Transition Note (Signed)
Transition of Care [facility]) - CM/SW Discharge Note   Patient Details  Name: Hanish Laraia MRN: [id number] Date of Birth: 1971-02-22  Transition of Care [facility]) CM/SW Contact:  Dessa Phi, RN Phone Number: 02/08/2022, 10:11 AM   Clinical Narrative: d/c home No orders or needs.       Final next level of care: Home/Self Care Barriers to Discharge: No Barriers Identified   Patient Goals and CMS Choice        Discharge Placement                       Discharge Plan and Services                                     Social Determinants of Health (SDOH) Interventions     Readmission Risk Interventions     No data to display

## 2022-02-08 NOTE — Plan of Care (Signed)
  Problem: Education: Goal: Understanding of discharge needs will improve Outcome: Adequate for Discharge Goal: Verbalization of understanding of the causes of altered bowel function will improve Outcome: Adequate for Discharge   Problem: Activity: Goal: Ability to tolerate increased activity will improve Outcome: Adequate for Discharge   Problem: Bowel/Gastric: Goal: Gastrointestinal status for postoperative course will improve Outcome: Adequate for Discharge   Problem: Health Behavior/Discharge Planning: Goal: Identification of community resources to assist with postoperative recovery needs will improve Outcome: Adequate for Discharge   Problem: Nutritional: Goal: Will attain and maintain optimal nutritional status will improve Outcome: Adequate for Discharge   Problem: Clinical Measurements: Goal: Postoperative complications will be avoided or minimized Outcome: Adequate for Discharge   Problem: Respiratory: Goal: Respiratory status will improve Outcome: Adequate for Discharge   Problem: Skin Integrity: Goal: Will show signs of wound healing Outcome: Adequate for Discharge   Problem: Education: Goal: Knowledge of General Education information will improve Description: Including pain rating scale, medication(s)/side effects and non-pharmacologic comfort measures Outcome: Adequate for Discharge   Problem: Health Behavior/Discharge Planning: Goal: Ability to manage health-related needs will improve Outcome: Adequate for Discharge   Problem: Clinical Measurements: Goal: Ability to maintain clinical measurements within normal limits will improve Outcome: Adequate for Discharge Goal: Will remain free from infection Outcome: Adequate for Discharge Goal: Diagnostic test results will improve Outcome: Adequate for Discharge Goal: Respiratory complications will improve Outcome: Adequate for Discharge Goal: Cardiovascular complication will be avoided Outcome: Adequate for  Discharge   Problem: Activity: Goal: Risk for activity intolerance will decrease Outcome: Adequate for Discharge   Problem: Nutrition: Goal: Adequate nutrition will be maintained Outcome: Adequate for Discharge   Problem: Coping: Goal: Level of anxiety will decrease Outcome: Adequate for Discharge   Problem: Elimination: Goal: Will not experience complications related to bowel motility Outcome: Adequate for Discharge Goal: Will not experience complications related to urinary retention Outcome: Adequate for Discharge   Problem: Pain Managment: Goal: General experience of comfort will improve Outcome: Adequate for Discharge   Problem: Safety: Goal: Ability to remain free from injury will improve Outcome: Adequate for Discharge   Problem: Skin Integrity: Goal: Risk for impaired skin integrity will decrease Outcome: Adequate for Discharge   Problem: Education: Goal: Ability to describe self-care measures that may prevent or decrease complications (Diabetes Survival Skills Education) will improve Outcome: Adequate for Discharge Goal: Individualized Educational Video(s) Outcome: Adequate for Discharge   Problem: Coping: Goal: Ability to adjust to condition or change in health will improve Outcome: Adequate for Discharge   Problem: Fluid Volume: Goal: Ability to maintain a balanced intake and output will improve Outcome: Adequate for Discharge   Problem: Health Behavior/Discharge Planning: Goal: Ability to identify and utilize available resources and services will improve Outcome: Adequate for Discharge Goal: Ability to manage health-related needs will improve Outcome: Adequate for Discharge   Problem: Metabolic: Goal: Ability to maintain appropriate glucose levels will improve Outcome: Adequate for Discharge   Problem: Nutritional: Goal: Maintenance of adequate nutrition will improve Outcome: Adequate for Discharge Goal: Progress toward achieving an optimal weight  will improve Outcome: Adequate for Discharge   Problem: Skin Integrity: Goal: Risk for impaired skin integrity will decrease Outcome: Adequate for Discharge   Problem: Tissue Perfusion: Goal: Adequacy of tissue perfusion will improve Outcome: Adequate for Discharge

## 2022-02-08 NOTE — Discharge Summary (Signed)
Physician Discharge Summary  Patient ID: James Coffey MRN: [id number] DOB/AGE: 1971-01-28 51 y.o.  Admit date: 02/04/2022 Discharge date: 02/08/2022  Admission Diagnoses:  Discharge Diagnoses:  Principal Problem:   Rectal cancer metastasized to liver [facility])   Discharged Condition: good  Hospital Course: uneventful post op recovery.  Bowel function returned on POD#4.  Doing well.  Tolerating po.  Discharged with drain in place  Consults: None  Significant Diagnostic Studies:   Treatments: surgery: Robotic APR with parastomal hernia repair  Discharge Exam: Blood pressure 102/66, pulse 84, temperature 98.8 F (37.1 C), temperature source Oral, resp. rate 18, height '5\' 9"'$  (1.753 m), weight 80 kg, SpO2 95 %. General appearance: alert, cooperative, and no distress Resp: clear to auscultation bilaterally Cardio: regular rate and rhythm, S1, S2 normal, no murmur, click, rub or gallop Incision/Wound: abdomen soft, incisions clean, ostomy productive, drain serosang, perineum incision clean  Disposition: Discharge disposition: 01-Home or Self Care        Allergies as of 02/08/2022       Reactions   Hydrocodone Itching        Medication List     TAKE these medications    acetaminophen 500 MG tablet Commonly known as: TYLENOL Take 2 tablets (1,000 mg total) by mouth every 8 (eight) hours as needed for mild pain or fever.   docusate sodium 100 MG capsule Commonly known as: COLACE Take 1 capsule (100 mg total) by mouth 2 (two) times daily.   ibuprofen 200 MG tablet Commonly known as: ADVIL Take 400 mg by mouth every 6 (six) hours as needed for moderate pain.   loperamide 2 MG capsule Commonly known as: IMODIUM Take 2 tablets by mouth at onset of diarrhea, then 1 tab every 2hrs until 12hr without a BM. May take 2 tab every 4hrs at bedtime. If diarrhea recurs repeat.   LORazepam 0.5 MG tablet Commonly known as: Ativan Take 1 tablet by mouth every 6 hours as needed for  anxiety.   oxyCODONE 5 MG immediate release tablet Commonly known as: Oxy IR/ROXICODONE Take 1 tablet (5 mg total) by mouth every 6 (six) hours as needed for moderate pain, severe pain or breakthrough pain.   prochlorperazine 10 MG tablet Commonly known as: COMPAZINE Take 1 tablet (10 mg total) by mouth every 6 (six) hours as needed for nausea or vomiting.        Follow-up Information     [name], MD. Schedule an appointment as soon as possible for a visit in 2 week(s).   Specialties: General Surgery, Colon and Rectal Surgery Contact information: [place] [address] [phone]                 Signed: Coralie Keens 02/08/2022, 8:26 AM

## 2022-02-08 NOTE — Progress Notes (Signed)
Patient ID: James Coffey, male   DOB: 30-Jul-1970, 51 y.o.   MRN: [id number]   Doing well Ostomy productive Wants to go home Plan: Discharge home

## 2022-02-08 NOTE — Progress Notes (Signed)
Assessment unchanged. Pt and wife verbalized understanding of dc instructions including medications, follow up care, and when to call MD. Pt independent with ostomy care. Taught pt and wife drain care, both verbalized and demonstrated understanding. [employer] Clinical Skills hand out on Surgical drain care and record provided. Discharged via wc to front entrance accompanied by NT and wife.

## 2022-02-16 LAB — SURGICAL PATHOLOGY

## 2022-02-18 ENCOUNTER — Other Ambulatory Visit: Payer: Self-pay

## 2022-02-18 NOTE — Progress Notes (Signed)
The proposed treatment discussed in conference is for discussion purpose only and is not a binding recommendation.  The patients have not been physically examined, or presented with their treatment options.  Therefore, final treatment plans cannot be decided.  

## 2022-03-07 ENCOUNTER — Other Ambulatory Visit: Payer: Self-pay

## 2022-03-12 ENCOUNTER — Other Ambulatory Visit: Payer: Self-pay | Admitting: General Surgery

## 2022-03-12 DIAGNOSIS — Z85048 Personal history of other malignant neoplasm of rectum, rectosigmoid junction, and anus: Secondary | ICD-10-CM

## 2022-03-16 ENCOUNTER — Other Ambulatory Visit: Payer: Self-pay

## 2022-03-16 ENCOUNTER — Inpatient Hospital Stay (HOSPITAL_COMMUNITY)
Admission: EM | Admit: 2022-03-16 | Discharge: 2022-03-22 | DRG: 372 | Disposition: A | Discharge status: Home Health | Payer: Medicaid Other | Attending: Family Medicine | Admitting: Family Medicine

## 2022-03-16 ENCOUNTER — Encounter (HOSPITAL_COMMUNITY): Payer: Self-pay

## 2022-03-16 ENCOUNTER — Emergency Department (HOSPITAL_COMMUNITY): Payer: Medicaid Other

## 2022-03-16 DIAGNOSIS — C787 Secondary malignant neoplasm of liver and intrahepatic bile duct: Secondary | ICD-10-CM | POA: Diagnosis not present

## 2022-03-16 DIAGNOSIS — N210 Calculus in bladder: Secondary | ICD-10-CM | POA: Diagnosis present

## 2022-03-16 DIAGNOSIS — R739 Hyperglycemia, unspecified: Secondary | ICD-10-CM | POA: Diagnosis present

## 2022-03-16 DIAGNOSIS — E871 Hypo-osmolality and hyponatremia: Secondary | ICD-10-CM | POA: Diagnosis present

## 2022-03-16 DIAGNOSIS — K9409 Other complications of colostomy: Secondary | ICD-10-CM | POA: Diagnosis present

## 2022-03-16 DIAGNOSIS — Z85048 Personal history of other malignant neoplasm of rectum, rectosigmoid junction, and anus: Secondary | ICD-10-CM | POA: Diagnosis not present

## 2022-03-16 DIAGNOSIS — F17210 Nicotine dependence, cigarettes, uncomplicated: Secondary | ICD-10-CM | POA: Diagnosis present

## 2022-03-16 DIAGNOSIS — Z79899 Other long term (current) drug therapy: Secondary | ICD-10-CM | POA: Diagnosis not present

## 2022-03-16 DIAGNOSIS — Y733 Surgical instruments, materials and gastroenterology and urology devices (including sutures) associated with adverse incidents: Secondary | ICD-10-CM | POA: Diagnosis present

## 2022-03-16 DIAGNOSIS — C20 Malignant neoplasm of rectum: Secondary | ICD-10-CM | POA: Diagnosis not present

## 2022-03-16 DIAGNOSIS — Z8505 Personal history of malignant neoplasm of liver: Secondary | ICD-10-CM

## 2022-03-16 DIAGNOSIS — R109 Unspecified abdominal pain: Secondary | ICD-10-CM | POA: Diagnosis present

## 2022-03-16 DIAGNOSIS — Z9221 Personal history of antineoplastic chemotherapy: Secondary | ICD-10-CM

## 2022-03-16 DIAGNOSIS — D649 Anemia, unspecified: Secondary | ICD-10-CM

## 2022-03-16 DIAGNOSIS — Z720 Tobacco use: Secondary | ICD-10-CM | POA: Diagnosis present

## 2022-03-16 DIAGNOSIS — Z885 Allergy status to narcotic agent status: Secondary | ICD-10-CM

## 2022-03-16 DIAGNOSIS — I77810 Thoracic aortic ectasia: Secondary | ICD-10-CM | POA: Diagnosis present

## 2022-03-16 DIAGNOSIS — D638 Anemia in other chronic diseases classified elsewhere: Secondary | ICD-10-CM | POA: Diagnosis present

## 2022-03-16 DIAGNOSIS — B9620 Unspecified Escherichia coli [E. coli] as the cause of diseases classified elsewhere: Secondary | ICD-10-CM | POA: Diagnosis present

## 2022-03-16 DIAGNOSIS — C7801 Secondary malignant neoplasm of right lung: Secondary | ICD-10-CM | POA: Diagnosis present

## 2022-03-16 DIAGNOSIS — E441 Mild protein-calorie malnutrition: Secondary | ICD-10-CM | POA: Diagnosis present

## 2022-03-16 DIAGNOSIS — Z923 Personal history of irradiation: Secondary | ICD-10-CM

## 2022-03-16 DIAGNOSIS — K651 Peritoneal abscess: Principal | ICD-10-CM | POA: Diagnosis present

## 2022-03-16 DIAGNOSIS — K435 Parastomal hernia without obstruction or  gangrene: Secondary | ICD-10-CM | POA: Diagnosis present

## 2022-03-16 DIAGNOSIS — Y838 Other surgical procedures as the cause of abnormal reaction of the patient, or of later complication, without mention of misadventure at the time of the procedure: Secondary | ICD-10-CM | POA: Diagnosis present

## 2022-03-16 DIAGNOSIS — Z6825 Body mass index (BMI) 25.0-25.9, adult: Secondary | ICD-10-CM | POA: Diagnosis not present

## 2022-03-16 DIAGNOSIS — C7800 Secondary malignant neoplasm of unspecified lung: Secondary | ICD-10-CM | POA: Diagnosis present

## 2022-03-16 LAB — COMPREHENSIVE METABOLIC PANEL
ALT: 31 U/L (ref 0–44)
AST: 25 U/L (ref 15–41)
Albumin: 3 g/dL — ABNORMAL LOW (ref 3.5–5.0)
Alkaline Phosphatase: 153 U/L — ABNORMAL HIGH (ref 38–126)
Anion gap: 10 (ref 5–15)
BUN: 12 mg/dL (ref 6–20)
CO2: 23 mmol/L (ref 22–32)
Calcium: 9.1 mg/dL (ref 8.9–10.3)
Chloride: 100 mmol/L (ref 98–111)
Creatinine, Ser: 0.86 mg/dL (ref 0.61–1.24)
GFR, Estimated: >60 mL/min (ref >60)
Glucose, Bld: 206 mg/dL — ABNORMAL HIGH (ref 70–99)
Potassium: 3.6 mmol/L (ref 3.5–5.1)
Sodium: 133 mmol/L — ABNORMAL LOW (ref 135–145)
Total Bilirubin: 1.2 mg/dL (ref 0.3–1.2)
Total Protein: 8.1 g/dL (ref 6.5–8.1)

## 2022-03-16 LAB — URINALYSIS, ROUTINE W REFLEX MICROSCOPIC
Bilirubin Urine: NEGATIVE
Glucose, UA: NEGATIVE mg/dL
Hgb urine dipstick: NEGATIVE
Ketones, ur: NEGATIVE mg/dL
Leukocytes,Ua: NEGATIVE
Nitrite: NEGATIVE
Protein, ur: NEGATIVE mg/dL
Specific Gravity, Urine: >1.046 — ABNORMAL HIGH (ref 1.005–1.030)
pH: 6 (ref 5.0–8.0)

## 2022-03-16 LAB — LIPASE, BLOOD: Lipase: 25 U/L (ref 11–51)

## 2022-03-16 LAB — CBC
HCT: 30.7 % — ABNORMAL LOW (ref 39.0–52.0)
Hemoglobin: 9.6 g/dL — ABNORMAL LOW (ref 13.0–17.0)
MCH: 28.1 pg (ref 26.0–34.0)
MCHC: 31.3 g/dL (ref 30.0–36.0)
MCV: 89.8 fL (ref 80.0–100.0)
Platelets: 408 10*3/uL — ABNORMAL HIGH (ref 150–400)
RBC: 3.42 MIL/uL — ABNORMAL LOW (ref 4.22–5.81)
RDW: 16 % — ABNORMAL HIGH (ref 11.5–15.5)
WBC: 12.8 10*3/uL — ABNORMAL HIGH (ref 4.0–10.5)
nRBC: 0 % (ref 0.0–0.2)

## 2022-03-16 LAB — PHOSPHORUS: Phosphorus: 3.3 mg/dL (ref 2.5–4.6)

## 2022-03-16 LAB — I-STAT CHEM 8, ED
BUN: 10 mg/dL (ref 6–20)
Calcium, Ion: 1.15 mmol/L (ref 1.15–1.40)
Chloride: 100 mmol/L (ref 98–111)
Creatinine, Ser: 0.7 mg/dL (ref 0.61–1.24)
Glucose, Bld: 198 mg/dL — ABNORMAL HIGH (ref 70–99)
HCT: 32 % — ABNORMAL LOW (ref 39.0–52.0)
Hemoglobin: 10.9 g/dL — ABNORMAL LOW (ref 13.0–17.0)
Potassium: 3.7 mmol/L (ref 3.5–5.1)
Sodium: 134 mmol/L — ABNORMAL LOW (ref 135–145)
TCO2: 23 mmol/L (ref 22–32)

## 2022-03-16 LAB — HEMOGLOBIN A1C
Hgb A1c MFr Bld: 5.5 % (ref 4.8–5.6)
Mean Plasma Glucose: 111.15 mg/dL

## 2022-03-16 LAB — MAGNESIUM: Magnesium: 2.2 mg/dL (ref 1.7–2.4)

## 2022-03-16 MED ORDER — PIPERACILLIN-TAZOBACTAM 3.375 G IVPB 30 MIN
3.3750 g | Freq: Once | INTRAVENOUS | Status: AC
  Administered 2022-03-16: 3.375 g via INTRAVENOUS
  Filled 2022-03-16: qty 50

## 2022-03-16 MED ORDER — NICOTINE 14 MG/24HR TD PT24: 14.0000 mg | MEDICATED_PATCH | Freq: Every day | TRANSDERMAL | Status: DC | PRN

## 2022-03-16 MED ORDER — PIPERACILLIN-TAZOBACTAM 3.375 G IVPB
3.3750 g | Freq: Three times a day (TID) | INTRAVENOUS | Status: DC
  Administered 2022-03-16 – 2022-03-20 (×12): 3.375 g via INTRAVENOUS
  Filled 2022-03-16 (×11): qty 50

## 2022-03-16 MED ORDER — LIP MEDEX EX OINT
TOPICAL_OINTMENT | CUTANEOUS | Status: DC | PRN
  Filled 2022-03-16: qty 7

## 2022-03-16 MED ORDER — IOHEXOL 300 MG/ML  SOLN
100.0000 mL | Freq: Once | INTRAMUSCULAR | Status: AC | PRN
  Administered 2022-03-16: 100 mL via INTRAVENOUS

## 2022-03-16 MED ORDER — PIPERACILLIN-TAZOBACTAM 3.375 G IVPB 30 MIN: 3.3750 g | Freq: Three times a day (TID) | INTRAVENOUS | Status: DC

## 2022-03-16 MED ORDER — SODIUM CHLORIDE (PF) 0.9 % IJ SOLN
INTRAMUSCULAR | Status: AC
  Filled 2022-03-16: qty 50

## 2022-03-16 MED ORDER — HYDROMORPHONE HCL 1 MG/ML IJ SOLN
1.0000 mg | Freq: Once | INTRAMUSCULAR | Status: AC
  Administered 2022-03-16: 1 mg via INTRAVENOUS
  Filled 2022-03-16: qty 1

## 2022-03-16 MED ORDER — TRAZODONE HCL 50 MG PO TABS
25.0000 mg | ORAL_TABLET | Freq: Every evening | ORAL | Status: DC | PRN
  Administered 2022-03-16 – 2022-03-20 (×4): 50 mg via ORAL
  Filled 2022-03-16 (×4): qty 1

## 2022-03-16 MED ORDER — ONDANSETRON HCL 4 MG PO TABS: 4.0000 mg | ORAL_TABLET | Freq: Four times a day (QID) | ORAL | Status: DC | PRN

## 2022-03-16 MED ORDER — ACETAMINOPHEN 325 MG PO TABS
650.0000 mg | ORAL_TABLET | Freq: Four times a day (QID) | ORAL | Status: DC | PRN
  Administered 2022-03-16 – 2022-03-20 (×5): 650 mg via ORAL
  Filled 2022-03-16 (×6): qty 2

## 2022-03-16 MED ORDER — LACTATED RINGERS IV BOLUS
1000.0000 mL | Freq: Once | INTRAVENOUS | Status: AC
  Administered 2022-03-16: 1000 mL via INTRAVENOUS

## 2022-03-16 MED ORDER — SODIUM CHLORIDE 0.9 % IV BOLUS
1000.0000 mL | Freq: Once | INTRAVENOUS | Status: AC
  Administered 2022-03-16: 1000 mL via INTRAVENOUS

## 2022-03-16 MED ORDER — ONDANSETRON HCL 4 MG/2ML IJ SOLN: 4.0000 mg | Freq: Four times a day (QID) | INTRAMUSCULAR | Status: DC | PRN

## 2022-03-16 MED ORDER — HYDROMORPHONE HCL 1 MG/ML IJ SOLN
1.0000 mg | INTRAMUSCULAR | Status: DC | PRN
  Administered 2022-03-16 – 2022-03-19 (×14): 1 mg via INTRAVENOUS
  Filled 2022-03-16 (×14): qty 1

## 2022-03-16 MED ORDER — POTASSIUM CHLORIDE IN NACL 20-0.9 MEQ/L-% IV SOLN
INTRAVENOUS | Status: DC
  Filled 2022-03-16 (×5): qty 1000

## 2022-03-16 MED ORDER — ACETAMINOPHEN 650 MG RE SUPP: 650.0000 mg | Freq: Four times a day (QID) | RECTAL | Status: DC | PRN

## 2022-03-16 MED ORDER — ONDANSETRON HCL 4 MG/2ML IJ SOLN
4.0000 mg | Freq: Once | INTRAMUSCULAR | Status: AC
  Administered 2022-03-16: 4 mg via INTRAVENOUS
  Filled 2022-03-16: qty 2

## 2022-03-16 NOTE — Progress Notes (Addendum)
Referring Physician(s): Toth,P  Supervising Physician: Aletta Edouard  Patient Status:  [facility] - In-pt  Chief Complaint:  Lower abdominal pain  Subjective: Pt known to IR service from port  a cath placement in 2022 and thermal ablation of right lobe liver lesion on 10/08/21. 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  He underwent a diverting colostomy due to the extensive nature of his wounds on 12/13/20. He underwent multiple debridements of his perineal wounds during the month of June. During 1 of these debridements, he was noted to have a distal rectal mass. Biopsy of this showed adenocarcinoma. GI was consulted and ultimately diagnosed a rectal cancer. This was determined to be stage 4 due to liver met. Oncology was consulted and started chemotherapy. He then underwent a partial left hepatectomy in March 2023 by Dr. [name]. He then underwent percutaneous ablation of a right liver tumor in early May. He finished radiation therapy on December 04, 2021. We then completed abdominal perineal resection, robotic approach and February 04, 2022 by Dr. [name]. He now has lower abd pain and breakdown of his perineal wound with  latest imaging showing:   1. Status post interval abdominal perineal resection and end colostomy. 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 No evidence of bowel obstruction. 2. Ill-defined 10.5 x 2.9 x 4.4 cm abscess in the ventral left peritoneal cavity near the end ostomy site. 3. Presacral space 5.9 x 2.6 x 12.9 cm collection with air-fluid level and surrounding ill-defined soft tissue and fat stranding, favor expected post-APR surgical change, although infected collection difficult to exclude in this location. 4. Solid 1.8 cm lingular pulmonary nodule, increased from 1.4 cm on 12/29/2021 CT, compatible with enlarging pulmonary metastasis. 5. Ablation defect in the right liver measures 3.8 x 2.3 cm, slightly decreased from 4.0 x 2.8  cm on 12/29/2021 CT. No new liver lesions. 6. Layering 7 mm stone in the bladder. No hydronephrosis. Chronic mild diffuse bladder wall thickening, nonspecific. 7. Coronary atherosclerosis. 8.  Aortic Atherosclerosis   Temp 99, WBC 12.8, hgb 9.6, plts 408k, creat nl, PT/INR pend. Blood cx pend; he is on IV Zosyn; request now received for image guided aspiration/possible drainage of presacral air/fluid collection. He denies high fever,HA,CP, dyspnea, cough, back pain,N/V or bleeding. He does have lower abd/stomal pain.    Past Medical History:  Diagnosis Date   Cancer ([place])    rectal cancer with mets to liver   Tobacco abuse    Past Surgical History:  Procedure Laterality Date   HERNIA REPAIR     INCISION AND DRAINAGE ABSCESS N/A 12/01/2020   Procedure: INCISION AND DRAINAGE ABSCESS;  Surgeon: Michael Boston, MD;  Location: [facility];  Service: General;  Laterality: N/A;   INCISION AND DRAINAGE ABSCESS N/A 12/04/2020   Procedure: INCISION AND DRAINAGE FOURNIERS GANGRENE;  Surgeon: [name], MD;  Location: [facility];  Service: General;  Laterality: N/A;   INCISION AND DRAINAGE PERIRECTAL ABSCESS N/A 11/30/2020   Procedure: INCISION AND DEBRIDEMENT OF PERIRECTAL ABSCESS AND RECTAL WALL BIOPSIES;  Surgeon: Michael Boston, MD;  Location: [facility];  Service: General;  Laterality: N/A;   INGUINAL HERNIA REPAIR Right 2007   IR IMAGING GUIDED PORT INSERTION  05/07/2021   IR RADIOLOGIST EVAL & MGMT  08/27/2021   IR RADIOLOGIST EVAL & MGMT  09/12/2021   IR RADIOLOGIST EVAL & MGMT  11/10/2021   IR RADIOLOGIST EVAL & MGMT  01/05/2022   LAPAROSCOPIC LOOP  COLOSTOMY N/A 12/13/2020   Procedure: DIAGNOSTIC LAPAROSCOPY WITH  OPEN DESCENDING LOOP COLOSTOMY;  Surgeon: Armandina Gemma, MD;  Location: [facility];  Service: General;  Laterality: N/A;   LAPAROSCOPY N/A 08/28/2021   Procedure: LAPAROSCOPY DIAGNOSTIC;  Surgeon: Dwan Bolt, MD;  Location: [place];  Service: General;  Laterality: N/A;   OPEN PARTIAL  HEPATECTOMY  Left 08/28/2021   Procedure: OPEN LEFT LATERAL HEPATECTOMY WITH INTRAOPERATIVE ULTRASOUND;  Surgeon: Dwan Bolt, MD;  Location: [place];  Service: General;  Laterality: Left;   RADIOLOGY WITH ANESTHESIA N/A 10/08/2021   Procedure: MICROWAVE ABLATION;  Surgeon: Aletta Edouard, MD;  Location: [facility];  Service: Radiology;  Laterality: N/A;   RECTAL SURGERY     XI ROBOTIC ASSISTED PARASTOMAL HERNIA REPAIR N/A 02/04/2022   Procedure: ROBOTIC ABDOMINAL PERINEAL RESECTION, PARASTOMAL HERNIA REPAIR, BILATERAL TAP BLOCK;  Surgeon: [name], MD;  Location: [facility];  Service: General;  Laterality: N/A;     Allergies: Hydrocodone  Medications: Prior to Admission medications   Medication Sig Start Date End Date Taking? Authorizing Provider  acetaminophen (TYLENOL) 500 MG tablet Take 2 tablets (1,000 mg total) by mouth every 8 (eight) hours as needed for mild pain or fever. Patient taking differently: Take 1,000 mg by mouth daily as needed for mild pain or fever. 12/19/20  Yes Barkley Boards R, PA-C  ibuprofen (ADVIL) 200 MG tablet Take 400 mg by mouth daily as needed for moderate pain.   Yes [provider]  docusate sodium (COLACE) 100 MG capsule Take 1 capsule (100 mg total) by mouth 2 (two) times daily. Patient not taking: Reported on 01/21/2022 08/31/21   Dwan Bolt, MD  loperamide (IMODIUM) 2 MG capsule Take 2 tablets by mouth at onset of diarrhea, then 1 tab every 2hrs until 12hr without a BM. May take 2 tab every 4hrs at bedtime. If diarrhea recurs repeat. Patient not taking: Reported on 09/29/2021 05/07/21   [name], MD  LORazepam (ATIVAN) 0.5 MG tablet Take 1 tablet by mouth every 6 hours as needed for anxiety. Patient not taking: Reported on 01/21/2022 11/05/21   [name], MD  oxyCODONE (OXY IR/ROXICODONE) 5 MG immediate release tablet Take 1 tablet (5 mg total) by mouth every 6 (six) hours as needed for moderate pain, severe pain or  breakthrough pain. Patient not taking: Reported on 03/16/2022 02/08/22   Coralie Keens, MD  prochlorperazine (COMPAZINE) 10 MG tablet Take 1 tablet (10 mg total) by mouth every 6 (six) hours as needed for nausea or vomiting. Patient not taking: Reported on 01/21/2022 11/05/21   [name], MD     Vital Signs: BP 113/74 (BP Location: Right Arm)   Pulse 89   Temp 99 F (37.2 C) (Oral)   Resp 18   Ht _0  (1.753 m)   Wt 175 lb (79.4 kg)   SpO2 98%   BMI 25.84 kg/m   Physical Exam:  awake/alert; chest- CTA bilat; rt chest port a cath intact; heart- RRR; abd- soft,+BS, mild dist, peristomal hernia noted, colostomy bag intact; no LE edema  Imaging: CT ABDOMEN PELVIS W CONTRAST  Result Date: 03/16/2022 CLINICAL DATA:  Acute abdominal pain, nonfocal. 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 Status post APR and conversion of loop colostomy to end ostomy 02/04/2022. * Tracking Code: BO * EXAM: CT ABDOMEN AND PELVIS WITH CONTRAST TECHNIQUE: Multidetector CT imaging of the abdomen and pelvis was performed using the standard protocol following bolus administration of intravenous contrast. RADIATION  DOSE REDUCTION: This exam was performed according to the departmental dose-optimization program which includes automated exposure control, adjustment of the mA and/or kV according to patient size and/or use of iterative reconstruction technique. CONTRAST:  145m OMNIPAQUE IOHEXOL 300 MG/ML  SOLN COMPARISON:  12/29/2021 CT abdomen. 04/21/2021 CT chest, abdomen and pelvis. 07/13/2021 MRI abdomen and pelvis. FINDINGS: Lower chest: Solid 1.8 cm lingular pulmonary nodule (series 6/image 24), increased from 1.4 cm on 12/29/2021 CT. Coronary atherosclerosis. Tip of superior approach central venous catheter seen at the cavoatrial junction. Hepatobiliary: Ablation defect in right liver measures 3.8 x 2.3 cm (series 2/image 28), slightly decreased from 4.0 x 2.8 cm on 12/29/2021 CT. Stable postsurgical changes  from partial anterior left hepatectomy with adjacent surgical clips. No new liver lesions. Normal gallbladder with no radiopaque cholelithiasis. No biliary ductal dilatation. Pancreas: Normal, with no mass or duct dilation. Spleen: Normal size. No mass. Adrenals/Urinary Tract: Normal adrenals. Simple 2.0 cm medial upper right renal cyst, for which no follow-up imaging is recommended. Otherwise normal kidneys, with no hydronephrosis. Layering 7 mm stone in bladder. Chronic mild diffuse bladder wall thickening. Stomach/Bowel: Normal non-distended stomach. Status post interval abdominal perineal resection and end colostomy. 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 Ill-defined 10.5 x 2.9 x 4.4 cm abscess in the ventral left peritoneal cavity with mildly thickened enhancing wall and internal gas (series 2/image 69). No significant small bowel dilatation or wall thickening. Appendix not discretely visualized. No wall thickening, diverticulosis or significant pericolonic fat stranding in the remnant large-bowel. Presacral space 5.9 x 2.6 x 12.9 cm collection with air-fluid level and surrounding ill-defined soft tissue and fat stranding (series 2/image 88). Vascular/Lymphatic: Atherosclerotic nonaneurysmal abdominal aorta. Patent portal, splenic, hepatic and renal veins. No pathologically enlarged lymph nodes in the abdomen or pelvis. Reproductive: Normal size prostate.  No ascites. Other: No pneumoperitoneum, ascites or focal fluid collection. Musculoskeletal: No aggressive appearing focal osseous lesions. Moderate degenerative disc disease at L5-S1. IMPRESSION: 1. Status post interval abdominal perineal resection and end colostomy. 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 No evidence of bowel obstruction. 2. Ill-defined 10.5 x 2.9 x 4.4 cm abscess in the ventral left peritoneal cavity near the end ostomy site. 3. Presacral space 5.9 x 2.6 x 12.9 cm collection with air-fluid level and surrounding  ill-defined soft tissue and fat stranding, favor expected post-APR surgical change, although infected collection difficult to exclude in this location. 4. Solid 1.8 cm lingular pulmonary nodule, increased from 1.4 cm on 12/29/2021 CT, compatible with enlarging pulmonary metastasis. 5. Ablation defect in the right liver measures 3.8 x 2.3 cm, slightly decreased from 4.0 x 2.8 cm on 12/29/2021 CT. No new liver lesions. 6. Layering 7 mm stone in the bladder. No hydronephrosis. Chronic mild diffuse bladder wall thickening, nonspecific. 7. Coronary atherosclerosis. 8.  Aortic Atherosclerosis (ICD10-I70.0). Electronically Signed   By: JIlona SorrelM.D.   On: 03/16/2022 10:31    Labs:  CBC: Recent Labs    01/20/22 0821 02/05/22 0457 02/06/22 0510 03/16/22 0849 03/16/22 0902  WBC 7.3 11.2* 8.8 12.8*  --   HGB 13.8 10.7* 10.3* 9.6* 10.9*  HCT 39.4 32.5* 31.2* 30.7* 32.0*  PLT 202 187 169 408*  --     COAGS: Recent Labs    10/02/21 0907  INR 1.0    BMP: Recent Labs    01/20/22 0821 02/05/22 0457 02/06/22 0510 03/16/22 0849 03/16/22 0902  NA 139 136 139 133* 134*  K 3.8 4.4 3.8 3.6 3.7  CL 109 109 107 100 100  CO2 _0 --   GLUCOSE 182* 185* 111* 206* 198*  BUN _1 CALCIUM 9.3 8.2* 8.6* 9.1  --   CREATININE 0.76 0.77 0.70 0.86 0.70  GFRNONAA >60 >60 >60 >60  --     LIVER FUNCTION TESTS: Recent Labs    11/05/21 1210 12/01/21 1131 01/20/22 0821 03/16/22 0849  BILITOT 0.6 0.7 0.6 1.2  AST 10* 12* 16 25  ALT _2 ALKPHOS 117 98 92 153*  PROT 8.0 7.3 7.2 8.1  ALBUMIN 4.0 4.0 3.7 3.0*    Assessment and Plan: 51 y.o. male with a hx of stage 4 rectal cancer (liver met s/p partial L hepatectomy by Dr. [name] 08/2021 and percutaneous ablation of a right liver tumor in early May) who is s/p completed abdominal perineal resection, robotic approach and February 04, 2022 by Dr. [name]; he now has lower abd pain and breakdown of his perineal wound with  latest  imaging showing:   1. Status post interval abdominal perineal resection and end colostomy. 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 No evidence of bowel obstruction. 2. Ill-defined 10.5 x 2.9 x 4.4 cm abscess in the ventral left peritoneal cavity near the end ostomy site. 3. Presacral space 5.9 x 2.6 x 12.9 cm collection with air-fluid level and surrounding ill-defined soft tissue and fat stranding, favor expected post-APR surgical change, although infected collection difficult to exclude in this location. 4. Solid 1.8 cm lingular pulmonary nodule, increased from 1.4 cm on 12/29/2021 CT, compatible with enlarging pulmonary metastasis. 5. Ablation defect in the right liver measures 3.8 x 2.3 cm, slightly decreased from 4.0 x 2.8 cm on 12/29/2021 CT. No new liver lesions. 6. Layering 7 mm stone in the bladder. No hydronephrosis. Chronic mild diffuse bladder wall thickening, nonspecific. 7. Coronary atherosclerosis. 8.  Aortic Atherosclerosis   Temp 99, WBC 12.8, hgb 9.6, plts 408k, creat nl, PT/INR pend. Blood cx pend; he is on IV Zosyn; request now received for image guided aspiration/possible drainage of presacral air/fluid collection; imaging studies have been reviewed by Dr. Serafina Royals.Risks and benefits discussed with the patient including bleeding, infection, damage to adjacent structures, bowel perforation/fistula connection, and sepsis.  All of the patient's questions were answered, patient is agreeable to proceed. Consent signed and in chart. Procedure tent scheduled for 10/10.     Electronically Signed: D. Rowe Robert, PA-C 03/16/2022, 4:00 PM   I spent a total of 25 Minutes at the the patient's bedside AND on the patient's hospital floor or unit, greater than 50% of which was counseling/coordinating care for image guided aspiration/possible drainage of presacral air/fluid collection    Patient ID: James Coffey, male   DOB: [birth year], 51 y.o.   MRN: [id number]

## 2022-03-16 NOTE — H&P (Signed)
History and Physical    Patient: James Coffey QMG:[id number] DOB: Dec 10, 1970 DOA: 03/16/2022 DOS: the patient was seen and examined on 03/16/2022 PCP: Patient, No Pcp Per  Patient coming from: Home  Chief Complaint:  Chief Complaint  Patient presents with   Abdominal Pain   HPI: James Coffey is a 51 y.o. 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 He denied fever, but has had chills, no night sweats.  No rhinorrhea, sore throat, wheezing or hemoptysis.  No chest pain, palpitations, diaphoresis, PND, orthopnea or pitting edema of the lower extremities.  No abdominal pain, nausea, emesis, diarrhea, constipation, melena or hematochezia.  No flank pain, dysuria, frequency or hematuria.  No polyuria, polydipsia, polyphagia or blurred vision.    ED course: Initial vital signs were temperature 98.4 F, pulse 115, respiration 20, BP 133/99 mmHg O2 sat 100% on room air.  The patient received hydromorphone 1 mg IVP, ondansetron 4 mg IVP, Zosyn 3.375 g IVPB, LR 1000 mL bolus and NS 1000 mL bolus.  Lab work: His urinalysis showed an increase of specific gravity of more than 1.0 46, but was otherwise unremarkable.  CBC with a white count of 12.8, hemoglobin 9.6 g/dL platelets 408.  Lipase was normal.  CMP showed a glucose of 206 mg/deciliter, albumin of 3.0 g/dL and alkaline phosphatase 153 units/L.  All other values are within expected range after sodium is corrected to glucose.  Imaging: CT abdomen/pelvis with contrast shows status post interval abdominal perineal resection and end colostomy.  There is a large parastomal hernia that contains a small bowel loop.  No bowel obstruction evidence.  There is  an ill-defined 10.5 x 2.9 x 4.4 cm abscess in the ventral left peritoneal cavity near the an ostomy site.  Presacral space 5.9 x 2.6 x 12.9 cm collection with air-fluid level and surrounding ill-defined septation fat stranding favor expected post APR surgical change, although infected collection difficult to exclude in this location.  There is a solid 1.8 cm lingular pulmonary nodule that has increased from 1.4 cm on previous CT from December 29, 2021.  This is compatible with enlarging pulmonary metastasis.  Appellation defatting the right liver measures 3.8 x 2.3 cm which is slightly decreased from previous.  There is a layering 7 mm stone in the bladder.  No hydronephrosis.  Chronic mild diffuse body wall thickening.  Coronary and aortic atherosclerosis.  Please see images and full radiology report for further details.  Review of Systems: As mentioned in the history of present illness. All other systems reviewed and are negative. Past Medical History:  Diagnosis Date   Cancer [facility])    rectal cancer with mets to liver   Tobacco abuse    Past Surgical History:  Procedure Laterality Date   HERNIA REPAIR     INCISION AND DRAINAGE ABSCESS N/A 12/01/2020   Procedure: INCISION AND DRAINAGE ABSCESS;  Surgeon: Michael Boston, MD;  Location: [facility];  Service: General;  Laterality: N/A;   INCISION AND DRAINAGE ABSCESS N/A 12/04/2020   Procedure: INCISION AND DRAINAGE FOURNIERS GANGRENE;  Surgeon: [name], MD;  Location: [facility];  Service: General;  Laterality: N/A;   INCISION AND DRAINAGE PERIRECTAL ABSCESS N/A 11/30/2020   Procedure: INCISION AND DEBRIDEMENT OF PERIRECTAL ABSCESS AND  RECTAL WALL BIOPSIES;  Surgeon: Michael Boston, MD;  Location: [facility];  Service: General;  Laterality: N/A;   INGUINAL HERNIA REPAIR Right 2007   IR IMAGING GUIDED PORT INSERTION  05/07/2021   IR RADIOLOGIST EVAL & MGMT  08/27/2021   IR RADIOLOGIST EVAL & MGMT  09/12/2021   IR RADIOLOGIST EVAL & MGMT  11/10/2021   IR  RADIOLOGIST EVAL & MGMT  01/05/2022   LAPAROSCOPIC LOOP COLOSTOMY N/A 12/13/2020   Procedure: DIAGNOSTIC LAPAROSCOPY WITH  OPEN DESCENDING LOOP COLOSTOMY;  Surgeon: Armandina Gemma, MD;  Location: [facility];  Service: General;  Laterality: N/A;   LAPAROSCOPY N/A 08/28/2021   Procedure: LAPAROSCOPY DIAGNOSTIC;  Surgeon: Dwan Bolt, MD;  Location: [place];  Service: General;  Laterality: N/A;   OPEN PARTIAL HEPATECTOMY  Left 08/28/2021   Procedure: OPEN LEFT LATERAL HEPATECTOMY WITH INTRAOPERATIVE ULTRASOUND;  Surgeon: Dwan Bolt, MD;  Location: [place];  Service: General;  Laterality: Left;   RADIOLOGY WITH ANESTHESIA N/A 10/08/2021   Procedure: MICROWAVE ABLATION;  Surgeon: Aletta Edouard, MD;  Location: [facility];  Service: Radiology;  Laterality: N/A;   RECTAL SURGERY     XI ROBOTIC ASSISTED PARASTOMAL HERNIA REPAIR N/A 02/04/2022   Procedure: ROBOTIC ABDOMINAL PERINEAL RESECTION, PARASTOMAL HERNIA REPAIR, BILATERAL TAP BLOCK;  Surgeon: [name], MD;  Location: [facility];  Service: General;  Laterality: N/A;   Social History:  reports that he has been smoking cigarettes. He has been smoking an average of .25 packs per day. He has never used smokeless tobacco. He reports that he does not drink alcohol and does not use drugs.  Allergies  Allergen Reactions   Hydrocodone Itching    Family History  Problem Relation Age of Onset   CAD Neg Hx    Inflammatory bowel disease Neg Hx     Prior to Admission medications   Medication Sig Start Date End Date Taking? Authorizing Provider  acetaminophen (TYLENOL) 500 MG tablet Take 2 tablets (1,000 mg total) by mouth every 8 (eight) hours as needed for mild pain or fever. Patient taking differently: Take 1,000 mg by mouth daily as needed for mild pain or fever. 12/19/20  Yes Barkley Boards R, PA-C  ibuprofen (ADVIL) 200 MG tablet Take 400 mg by mouth daily as needed for moderate pain.   Yes [provider]  docusate sodium (COLACE) 100 MG capsule  Take 1 capsule (100 mg total) by mouth 2 (two) times daily. Patient not taking: Reported on 01/21/2022 08/31/21   Dwan Bolt, MD  loperamide (IMODIUM) 2 MG capsule Take 2 tablets by mouth at onset of diarrhea, then 1 tab every 2hrs until 12hr without a BM. May take 2 tab every 4hrs at bedtime. If diarrhea recurs repeat. Patient not taking: Reported on 09/29/2021 05/07/21   [name], MD  LORazepam (ATIVAN) 0.5 MG tablet Take 1 tablet by mouth every 6 hours as needed for anxiety. Patient not taking: Reported on 01/21/2022 11/05/21   [name], MD  oxyCODONE (OXY IR/ROXICODONE) 5 MG immediate release tablet Take 1 tablet (5 mg total) by mouth every 6 (six) hours as needed for moderate pain, severe pain or breakthrough pain. Patient not taking: Reported on 03/16/2022 02/08/22   Coralie Keens, MD  prochlorperazine (COMPAZINE) 10 MG tablet Take 1 tablet (10 mg total) by mouth every 6 (six) hours as needed for nausea or vomiting. Patient not taking: Reported on 01/21/2022 11/05/21   [name], MD    Physical Exam: Vitals:  03/16/22 1115 03/16/22 1132 03/16/22 1136 03/16/22 1315  BP: 99/66 94/60  113/74  Pulse: 93 92  89  Resp:  16  18  Temp:   99.8 F (37.7 C) 99 F (37.2 C)  TempSrc:   Oral Oral  SpO2: 96% 94%  98%  Weight:      Height:       Physical Exam Vitals and nursing note reviewed.  Constitutional:      General: He is awake. He is not in acute distress.    Appearance: He is well-developed.     Comments: Chronically ill-appearing.  HENT:     Head: Normocephalic.     Nose: No rhinorrhea.     Mouth/Throat:     Mouth: Mucous membranes are dry.  Eyes:     General: No scleral icterus.    Pupils: Pupils are equal, round, and reactive to light.  Neck:     Vascular: No JVD.  Cardiovascular:     Rate and Rhythm: Normal rate and regular rhythm.     Heart sounds: Normal heart sounds, S1 normal and S2 normal.  Pulmonary:     Effort: Pulmonary  effort is normal.     Breath sounds: No wheezing, rhonchi or rales.  Abdominal:     General: Bowel sounds are normal. There is distension.     Palpations: Abdomen is soft.     Tenderness: There is abdominal tenderness in the epigastric area. There is no guarding or rebound.     Comments: Positive tenderness around ostomy site.  Musculoskeletal:     Cervical back: Neck supple.     Right lower leg: No edema.     Left lower leg: No edema.  Skin:    General: Skin is warm and dry.  Neurological:     General: No focal deficit present.     Mental Status: He is alert and oriented to person, place, and time.  Psychiatric:        Mood and Affect: Mood normal.        Behavior: Behavior normal. Behavior is cooperative.     Data Reviewed:  There are no new results to review at this time.  Assessment and Plan: Principal Problem:   Intra-abdominal abscess ([place]) Admit to telemetry/inpatient. Keep NPO. Continue IV fluids. Analgesics as needed. Antiemetics as needed. Pantoprazole 40 mg IVP every 24 hours. Continue Zosyn 3.375 g every 8 hours. Follow-up CBC and CMP in AM. General surgery input appreciated. Interventional radiology input appreciated.  Active Problems:   Tobacco abuse Nicotine replacement therapy as needed. Tobacco cessation.    Rectal cancer metastasized to liver [facility]) Follow-up with oncology.    Hyponatremia Pseudohyponatremia due to hyperglycemia. Follow-up sodium level.    Mild protein malnutrition (HCC) Protein supplementation. Consider nutritional services evaluation.    Hyperglycemia Check fasting glucose. Check hemoglobin A1c.      Advance Care Planning:   Code Status: Full Code   Consults: [place] surgery.  Family Communication:  Severity of Illness: The appropriate patient status for this patient is INPATIENT. Inpatient status is judged to be reasonable and necessary in order to provide the required intensity of service to ensure  the patient's safety. The patient's presenting symptoms, physical exam findings, and initial radiographic and laboratory data in the context of their chronic comorbidities is felt to place them at high risk for further clinical deterioration. Furthermore, it is not anticipated that the patient will be medically stable for discharge from the hospital within 2 midnights of  admission.   * I certify that at the point of admission it is my clinical judgment that the patient will require inpatient hospital care spanning beyond 2 midnights from the point of admission due to high intensity of service, high risk for further deterioration and high frequency of surveillance required.*  Author: Reubin Milan, MD 03/16/2022 2:01 PM  For on call review [email or web address].   This document was prepared using Dragon voice recognition software and may contain some unintended transcription errors.

## 2022-03-16 NOTE — ED Notes (Signed)
Patient transported to CT 

## 2022-03-16 NOTE — ED Triage Notes (Signed)
Patient reports that he began having increased abdominal pain and distention x 3 days ago. Patient also c/o nausea, but denies vomiting Patient reports a history of colon cancer that has metastasized to the liver. Patient also has a colostomy

## 2022-03-16 NOTE — Consult Note (Signed)
James Coffey August 27, 1970  [id number].    Requesting MD: Dr. Deno Etienne Chief Complaint/Reason for Consult: Abdominal pain.   Translator used for this encounter upon patients request  HPI:  James Coffey is a 51 y.o. male known to our service who presented to the ED for abdominal pain.   Patient is status post hospitalization last year due to necrotizing fasciitis of the perineum. He underwent a diverting colostomy due to the extensive nature of his wounds on 12/13/20. He underwent multiple debridements of his perineal wounds during the month of June. During 1 of these debridements, he was noted to have a distal rectal mass. Biopsy of this showed adenocarcinoma. GI was consulted and ultimately diagnosed a rectal cancer. This was determined to be stage 4 due to liver met. Oncology was consulted and started chemotherapy. He then underwent a partial left hepatectomy in March 2023 by Dr. [name]. He then underwent percutaneous ablation of a right liver tumor in early May. He finished radiation therapy on December 04, 2021. We then completed abdominal perineal resection, robotic approach and February 04, 2022 by Dr. [name].   He saw Dr. [name] on 02/23/2022 at which time the perineal wound had broken down. She recommended keeping the JP drain in place and packing the open perineal wound with wet-to-dry dressing twice daily. He was seen back in the office on 10/2 for a recheck. At that time the JP drain had not collected much fluid in the last 3 days so it was removed.   Patient reports over the last 7-10 days he has been having increasing pain below his stoma.  He reports every other day he notices blood from around his stoma - last episode yesterday.  It is unclear if this is from the actual stoma itself or from the skin around it.  There is no ostomy supplies in the room for medial to take a look at this currently.  He reports he has been tolerating a diet at home without nausea or vomiting but not having much  of an appetite.  Still having ostomy output.  Denies fevers.  His wife has been doing WTD packing of his perineal wound.  He reports she still having trouble getting in with the ostomy clinic.   CT here showed  1. Status post interval abdominal perineal resection and end colostomy. 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 No evidence of bowel obstruction. 2. Ill-defined 10.5 x 2.9 x 4.4 cm abscess in the ventral left peritoneal cavity near the end ostomy site. 3. Presacral space 5.9 x 2.6 x 12.9 cm collection with air-fluid level and surrounding ill-defined soft tissue and fat stranding, favor expected post-APR surgical change, although infected collection difficult to exclude in this location. 4. Solid 1.8 cm lingular pulmonary nodule, increased from 1.4 cm on 12/29/2021 CT, compatible with enlarging pulmonary metastasis. 5. Ablation defect in the right liver measures 3.8 x 2.3 cm, slightly decreased from 4.0 x 2.8 cm on 12/29/2021 CT. No new liver lesions. 6. Layering 7 mm stone in the bladder. No hydronephrosis. Chronic mild diffuse bladder wall thickening, nonspecific.  GI: [facility] GU: [facility] - chemo currently?   ROS: ROS As above, see hpi  Family History  Problem Relation Age of Onset   CAD Neg Hx    Inflammatory bowel disease Neg Hx     Past Medical History:  Diagnosis Date   Cancer ([place])    rectal cancer with mets to liver   Tobacco abuse  Past Surgical History:  Procedure Laterality Date   HERNIA REPAIR     INCISION AND DRAINAGE ABSCESS N/A 12/01/2020   Procedure: INCISION AND DRAINAGE ABSCESS;  Surgeon:  Boston, MD;  Location: [facility];  Service: General;  Laterality: N/A;   INCISION AND DRAINAGE ABSCESS N/A 12/04/2020   Procedure: INCISION AND DRAINAGE FOURNIERS GANGRENE;  Surgeon: [name], MD;  Location: [facility];  Service: General;  Laterality: N/A;   INCISION AND DRAINAGE PERIRECTAL ABSCESS N/A 11/30/2020   Procedure: INCISION AND  DEBRIDEMENT OF PERIRECTAL ABSCESS AND RECTAL WALL BIOPSIES;  Surgeon:  Boston, MD;  Location: [facility];  Service: General;  Laterality: N/A;   INGUINAL HERNIA REPAIR Right 2007   IR IMAGING GUIDED PORT INSERTION  05/07/2021   IR RADIOLOGIST EVAL & MGMT  08/27/2021   IR RADIOLOGIST EVAL & MGMT  09/12/2021   IR RADIOLOGIST EVAL & MGMT  11/10/2021   IR RADIOLOGIST EVAL & MGMT  01/05/2022   LAPAROSCOPIC LOOP COLOSTOMY N/A 12/13/2020   Procedure: DIAGNOSTIC LAPAROSCOPY WITH  OPEN DESCENDING LOOP COLOSTOMY;  Surgeon: Armandina Gemma, MD;  Location: [facility];  Service: General;  Laterality: N/A;   LAPAROSCOPY N/A 08/28/2021   Procedure: LAPAROSCOPY DIAGNOSTIC;  Surgeon: Dwan Bolt, MD;  Location: [place];  Service: General;  Laterality: N/A;   OPEN PARTIAL HEPATECTOMY  Left 08/28/2021   Procedure: OPEN LEFT LATERAL HEPATECTOMY WITH INTRAOPERATIVE ULTRASOUND;  Surgeon: Dwan Bolt, MD;  Location: [place];  Service: General;  Laterality: Left;   RADIOLOGY WITH ANESTHESIA N/A 10/08/2021   Procedure: MICROWAVE ABLATION;  Surgeon: Aletta Edouard, MD;  Location: [facility];  Service: Radiology;  Laterality: N/A;   RECTAL SURGERY     XI ROBOTIC ASSISTED PARASTOMAL HERNIA REPAIR N/A 02/04/2022   Procedure: ROBOTIC ABDOMINAL PERINEAL RESECTION, PARASTOMAL HERNIA REPAIR, BILATERAL TAP BLOCK;  Surgeon: [name], MD;  Location: [facility];  Service: General;  Laterality: N/A;    Social History:  reports that he has been smoking cigarettes. He has been smoking an average of .25 packs per day. He has never used smokeless tobacco. He reports that he does not drink alcohol and does not use drugs.  Allergies:  Allergies  Allergen Reactions   Hydrocodone Itching    (Not in a hospital admission)    Physical Exam: Blood pressure 94/60, pulse 92, temperature 98.4 F (36.9 C), temperature source Oral, resp. rate 16, height '5\' 9"'  (1.753 m), weight 79.4 kg, SpO2 94 %. General: pleasant, WD/WN male who is laying in bed  in NAD HEENT: head is normocephalic, atraumatic.  Sclera are noninjected.  PERRL.  Ears and nose without any masses or lesions.  Mouth is pink and moist. Dentition fair Heart: regular, rate, and rhythm. Lungs: CTAB, no wheezes, rhonchi, or rales noted.  Respiratory effort nonlabored Abd:  Soft, mild distension, ttp across the mid/left abdomen below his stoma without rigidity or guarding, +BS. Peristomal hernia is soft. Colostomy bag with liquid stool in bag. Stoma appaers pink, budded and viable.  GU: Perineal incision has completely broken down. There is thin, yellow/tan foul smelling fluid draining from this area.  MS: no BUE/BLE edema Skin: warm and dry with no masses, lesions, or rashes Psych: A&Ox4 with an appropriate affect Neuro: cranial nerves grossly intact, normal speech, thought process intact, moves all extremities, gait not assessed  Results for orders placed or performed during the hospital encounter of 03/16/22 (from the past 48 hour(s))  Lipase, blood     Status: None   Collection  Time: 03/16/22  8:49 AM  Result Value Ref Range   Lipase 25 11 - 51 U/L    Comment: Performed at [facility], [place] [address], [place], [place] [address]  Comprehensive metabolic panel     Status: Abnormal   Collection Time: 03/16/22  8:49 AM  Result Value Ref Range   Sodium 133 (L) 135 - 145 mmol/L   Potassium 3.6 3.5 - 5.1 mmol/L   Chloride 100 98 - 111 mmol/L   CO2 23 22 - 32 mmol/L   Glucose, Bld 206 (H) 70 - 99 mg/dL    Comment: Glucose reference range applies only to samples taken after fasting for at least 8 hours.   BUN 12 6 - 20 mg/dL   Creatinine, Ser 0.86 0.61 - 1.24 mg/dL   Calcium 9.1 8.9 - 10.3 mg/dL   Total Protein 8.1 6.5 - 8.1 g/dL   Albumin 3.0 (L) 3.5 - 5.0 g/dL   AST 25 15 - 41 U/L   ALT 31 0 - 44 U/L   Alkaline Phosphatase 153 (H) 38 - 126 U/L   Total Bilirubin 1.2 0.3 - 1.2 mg/dL   GFR, Estimated >60 >60 mL/min    Comment: (NOTE) Calculated using  the CKD-EPI Creatinine Equation (2021)    Anion gap 10 5 - 15    Comment: Performed at [facility], [place] [address] [place], [place] [address]  CBC     Status: Abnormal   Collection Time: 03/16/22  8:49 AM  Result Value Ref Range   WBC 12.8 (H) 4.0 - 10.5 K/uL   RBC 3.42 (L) 4.22 - 5.81 MIL/uL   Hemoglobin 9.6 (L) 13.0 - 17.0 g/dL   HCT 30.7 (L) 39.0 - 52.0 %   MCV 89.8 80.0 - 100.0 fL   MCH 28.1 26.0 - 34.0 pg   MCHC 31.3 30.0 - 36.0 g/dL   RDW 16.0 (H) 11.5 - 15.5 %   Platelets 408 (H) 150 - 400 K/uL   nRBC 0.0 0.0 - 0.2 %    Comment: Performed at [facility], [place] [address], [place], [place] [address]  I-stat chem 8, ED (not at [facility] or [facility])     Status: Abnormal   Collection Time: 03/16/22  9:02 AM  Result Value Ref Range   Sodium 134 (L) 135 - 145 mmol/L   Potassium 3.7 3.5 - 5.1 mmol/L   Chloride 100 98 - 111 mmol/L   BUN 10 6 - 20 mg/dL   Creatinine, Ser 0.70 0.61 - 1.24 mg/dL   Glucose, Bld 198 (H) 70 - 99 mg/dL    Comment: Glucose reference range applies only to samples taken after fasting for at least 8 hours.   Calcium, Ion 1.15 1.15 - 1.40 mmol/L   TCO2 23 22 - 32 mmol/L   Hemoglobin 10.9 (L) 13.0 - 17.0 g/dL   HCT 32.0 (L) 39.0 - 52.0 %   CT ABDOMEN PELVIS W CONTRAST  Result Date: 03/16/2022 CLINICAL DATA:  Acute abdominal pain, nonfocal. 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 Status post APR and conversion of loop colostomy to end ostomy 02/04/2022. * Tracking Code: BO * EXAM: CT ABDOMEN AND PELVIS WITH CONTRAST TECHNIQUE: Multidetector CT imaging of the abdomen and pelvis was performed using the standard protocol following bolus administration of intravenous contrast. RADIATION DOSE REDUCTION: This exam was performed according to the departmental dose-optimization program which includes automated exposure control, adjustment of the mA and/or kV according to patient size  and/or use of iterative reconstruction technique. CONTRAST:   12m OMNIPAQUE IOHEXOL 300 MG/ML  SOLN COMPARISON:  12/29/2021 CT abdomen. 04/21/2021 CT chest, abdomen and pelvis. 07/13/2021 MRI abdomen and pelvis. FINDINGS: Lower chest: Solid 1.8 cm lingular pulmonary nodule (series 6/image 24), increased from 1.4 cm on 12/29/2021 CT. Coronary atherosclerosis. Tip of superior approach central venous catheter seen at the cavoatrial junction. Hepatobiliary: Ablation defect in right liver measures 3.8 x 2.3 cm (series 2/image 28), slightly decreased from 4.0 x 2.8 cm on 12/29/2021 CT. Stable postsurgical changes from partial anterior left hepatectomy with adjacent surgical clips. No new liver lesions. Normal gallbladder with no radiopaque cholelithiasis. No biliary ductal dilatation. Pancreas: Normal, with no mass or duct dilation. Spleen: Normal size. No mass. Adrenals/Urinary Tract: Normal adrenals. Simple 2.0 cm medial upper right renal cyst, for which no follow-up imaging is recommended. Otherwise normal kidneys, with no hydronephrosis. Layering 7 mm stone in bladder. Chronic mild diffuse bladder wall thickening. Stomach/Bowel: Normal non-distended stomach. Status post interval abdominal perineal resection and end colostomy. 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 Ill-defined 10.5 x 2.9 x 4.4 cm abscess in the ventral left peritoneal cavity with mildly thickened enhancing wall and internal gas (series 2/image 69). No significant small bowel dilatation or wall thickening. Appendix not discretely visualized. No wall thickening, diverticulosis or significant pericolonic fat stranding in the remnant large-bowel. Presacral space 5.9 x 2.6 x 12.9 cm collection with air-fluid level and surrounding ill-defined soft tissue and fat stranding (series 2/image 88). Vascular/Lymphatic: Atherosclerotic nonaneurysmal abdominal aorta. Patent portal, splenic, hepatic and renal veins. No pathologically enlarged lymph nodes in the abdomen or pelvis. Reproductive: Normal size  prostate.  No ascites. Other: No pneumoperitoneum, ascites or focal fluid collection. Musculoskeletal: No aggressive appearing focal osseous lesions. Moderate degenerative disc disease at L5-S1. IMPRESSION: 1. Status post interval abdominal perineal resection and end colostomy. 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 No evidence of bowel obstruction. 2. Ill-defined 10.5 x 2.9 x 4.4 cm abscess in the ventral left peritoneal cavity near the end ostomy site. 3. Presacral space 5.9 x 2.6 x 12.9 cm collection with air-fluid level and surrounding ill-defined soft tissue and fat stranding, favor expected post-APR surgical change, although infected collection difficult to exclude in this location. 4. Solid 1.8 cm lingular pulmonary nodule, increased from 1.4 cm on 12/29/2021 CT, compatible with enlarging pulmonary metastasis. 5. Ablation defect in the right liver measures 3.8 x 2.3 cm, slightly decreased from 4.0 x 2.8 cm on 12/29/2021 CT. No new liver lesions. 6. Layering 7 mm stone in the bladder. No hydronephrosis. Chronic mild diffuse bladder wall thickening, nonspecific. 7. Coronary atherosclerosis. 8.  Aortic Atherosclerosis (ICD10-I70.0). Electronically Signed   By: JIlona SorrelM.D.   On: 03/16/2022 10:31    Anti-infectives (From admission, onward)    Start     Dose/Rate Route Frequency Ordered Stop   03/16/22 1100  piperacillin-tazobactam (ZOSYN) IVPB 3.375 g        3.375 g 100 mL/hr over 30 Minutes Intravenous  Once 03/16/22 1050         Assessment/Plan This is a 51y.o. male with a hx of stage 4 rectal cancer (liver met s/p partial L hepatectomy by Dr. AZenia Resides3/2023 and percutaneous ablation of a right liver tumor in early May) who is s/p completed abdominal perineal resection, robotic approach and February 04, 2022 by Dr. TMarcello Moores  - Patient with breakdown of perineal wound. Cont BID WTD dressing changes. CT w/ presacral space 5.9  x 2.6 x 12.9 cm collection with air-fluid level and  surrounding ill-defined soft tissue and fat stranding. Reviewed this with my attending who recommends abx and IR drainage.  - Patient also with ill-defined 10.5 x 2.9 x 4.4 cm abscess in the ventral left peritoneal cavity near the end ostomy site. Reviewed with attending. Would NOT recommend IR drainage of this area. Cont abx and monitor - Patient with parastomal hernia. This was repaired primarily on 8/30. Appears to have recurred. No signs of obstructions clinically or on imaging. No indication for emergency surgery - He notes peristoma wound issues and bleeding. Consult wocn for eval and will look at it with them - Notes difficulty getting in with ostomy clinic. Will send them a message to ensure follow up as outpt - We will follow.   FEN - NPO for IR eval. IVF VTE - SCDs, okay for chem ppx from our standpoint ID - Zosyn  Solid 1.8 cm lingular pulmonary nodule, increased from 1.4 cm on 12/29/2021 CT, compatible with enlarging pulmonary metastasis - will need follow up with Dr. Irene Limbo Bladder stone  Jillyn Ledger, [facility] Surgery 03/16/2022, 11:33 AM Please see Amion for pager number during day hours 7:00am-4:30pm

## 2022-03-16 NOTE — ED Provider Notes (Signed)
[facility] DEPT Provider Note   CSN: [id number] Arrival date & time: 03/16/22  0867     History  Chief Complaint  Patient presents with   Abdominal Pain    James Coffey is a 51 y.o. male.  51 yo M with a cc of abdominal pain.  Going on for the past week or so.  Seen in the surgery office and worsening since.  Pain about the site of his ostomy.  Some bleeding noted there at home.  He denies any fevers denies vomiting.  Has been having normal output from his ostomy.  Feels like he has not been able to eat or drink for the past couple days.   Abdominal Pain      Home Medications Prior to Admission medications   Medication Sig Start Date End Date Taking? Authorizing Provider  acetaminophen (TYLENOL) 500 MG tablet Take 2 tablets (1,000 mg total) by mouth every 8 (eight) hours as needed for mild pain or fever. Patient taking differently: Take 1,000 mg by mouth daily as needed for mild pain or fever. 12/19/20  Yes Barkley Boards R, PA-C  ibuprofen (ADVIL) 200 MG tablet Take 400 mg by mouth daily as needed for moderate pain.   Yes [provider]  docusate sodium (COLACE) 100 MG capsule Take 1 capsule (100 mg total) by mouth 2 (two) times daily. Patient not taking: Reported on 01/21/2022 08/31/21   Dwan Bolt, MD  loperamide (IMODIUM) 2 MG capsule Take 2 tablets by mouth at onset of diarrhea, then 1 tab every 2hrs until 12hr without a BM. May take 2 tab every 4hrs at bedtime. If diarrhea recurs repeat. Patient not taking: Reported on 09/29/2021 05/07/21   [name], MD  LORazepam (ATIVAN) 0.5 MG tablet Take 1 tablet by mouth every 6 hours as needed for anxiety. Patient not taking: Reported on 01/21/2022 11/05/21   [name], MD  oxyCODONE (OXY IR/ROXICODONE) 5 MG immediate release tablet Take 1 tablet (5 mg total) by mouth every 6 (six) hours as needed for moderate pain, severe pain or breakthrough pain. Patient not taking:  Reported on 03/16/2022 02/08/22   Coralie Keens, MD  prochlorperazine (COMPAZINE) 10 MG tablet Take 1 tablet (10 mg total) by mouth every 6 (six) hours as needed for nausea or vomiting. Patient not taking: Reported on 01/21/2022 11/05/21   [name], MD      Allergies    Hydrocodone    Review of Systems   Review of Systems  Gastrointestinal:  Positive for abdominal pain.    Physical Exam Updated Vital Signs BP 113/74 (BP Location: Right Arm)   Pulse 89   Temp 99 F (37.2 C) (Oral)   Resp 18   Ht '5\' 9"'$  (1.753 m)   Wt 79.4 kg   SpO2 98%   BMI 25.84 kg/m  Physical Exam Vitals and nursing note reviewed.  Constitutional:      Appearance: He is well-developed.  HENT:     Head: Normocephalic and atraumatic.  Eyes:     Pupils: Pupils are equal, round, and reactive to light.  Neck:     Vascular: No JVD.  Cardiovascular:     Rate and Rhythm: Normal rate and regular rhythm.     Heart sounds: No murmur heard.    No friction rub. No gallop.  Pulmonary:     Effort: No respiratory distress.     Breath sounds: No wheezing.  Abdominal:     General: There  is distension.     Tenderness: There is no abdominal tenderness. There is no guarding or rebound.     Comments: Some distention at the base of the ostomy site.  Soft abdomen.  No obvious palpable hernia.  No appreciable bleeding.  Soft brown stool in his colostomy bag.  Pain diffusely around the ostomy site.  Worse to the inferior aspect.  Musculoskeletal:        General: Normal range of motion.     Cervical back: Normal range of motion and neck supple.  Skin:    Coloration: Skin is not pale.     Findings: No rash.  Neurological:     Mental Status: He is alert and oriented to person, place, and time.  Psychiatric:        Behavior: Behavior normal.     ED Results / Procedures / Treatments   Labs (all labs ordered are listed, but only abnormal results are displayed) Labs Reviewed  COMPREHENSIVE METABOLIC PANEL -  Abnormal; Notable for the following components:      Result Value   Sodium 133 (*)    Glucose, Bld 206 (*)    Albumin 3.0 (*)    Alkaline Phosphatase 153 (*)    All other components within normal limits  CBC - Abnormal; Notable for the following components:   WBC 12.8 (*)    RBC 3.42 (*)    Hemoglobin 9.6 (*)    HCT 30.7 (*)    RDW 16.0 (*)    Platelets 408 (*)    All other components within normal limits  URINALYSIS, ROUTINE W REFLEX MICROSCOPIC - Abnormal; Notable for the following components:   Specific Gravity, Urine >1.046 (*)    All other components within normal limits  I-STAT CHEM 8, ED - Abnormal; Notable for the following components:   Sodium 134 (*)    Glucose, Bld 198 (*)    Hemoglobin 10.9 (*)    HCT 32.0 (*)    All other components within normal limits  CULTURE, BLOOD (ROUTINE X 2)  CULTURE, BLOOD (ROUTINE X 2)  LIPASE, BLOOD  MAGNESIUM  PHOSPHORUS  HEMOGLOBIN A1C    EKG None  Radiology CT ABDOMEN PELVIS W CONTRAST  Result Date: 03/16/2022 CLINICAL DATA:  Acute abdominal pain, nonfocal. 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 Status post APR and conversion of loop colostomy to end ostomy 02/04/2022. * Tracking Code: BO * EXAM: CT ABDOMEN AND PELVIS WITH CONTRAST TECHNIQUE: Multidetector CT imaging of the abdomen and pelvis was performed using the standard protocol following bolus administration of intravenous contrast. RADIATION DOSE REDUCTION: This exam was performed according to the departmental dose-optimization program which includes automated exposure control, adjustment of the mA and/or kV according to patient size and/or use of iterative reconstruction technique. CONTRAST:  176m OMNIPAQUE IOHEXOL 300 MG/ML  SOLN COMPARISON:  12/29/2021 CT abdomen. 04/21/2021 CT chest, abdomen and pelvis. 07/13/2021 MRI abdomen and pelvis. FINDINGS: Lower chest: Solid 1.8 cm lingular pulmonary nodule (series 6/image 24), increased from 1.4 cm on 12/29/2021 CT. Coronary  atherosclerosis. Tip of superior approach central venous catheter seen at the cavoatrial junction. Hepatobiliary: Ablation defect in right liver measures 3.8 x 2.3 cm (series 2/image 28), slightly decreased from 4.0 x 2.8 cm on 12/29/2021 CT. Stable postsurgical changes from partial anterior left hepatectomy with adjacent surgical clips. No new liver lesions. Normal gallbladder with no radiopaque cholelithiasis. No biliary ductal dilatation. Pancreas: Normal, with no mass or duct dilation. Spleen: Normal size. No mass. Adrenals/Urinary Tract: Normal  adrenals. Simple 2.0 cm medial upper right renal cyst, for which no follow-up imaging is recommended. Otherwise normal kidneys, with no hydronephrosis. Layering 7 mm stone in bladder. Chronic mild diffuse bladder wall thickening. Stomach/Bowel: Normal non-distended stomach. Status post interval abdominal perineal resection and end colostomy. 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 Ill-defined 10.5 x 2.9 x 4.4 cm abscess in the ventral left peritoneal cavity with mildly thickened enhancing wall and internal gas (series 2/image 69). No significant small bowel dilatation or wall thickening. Appendix not discretely visualized. No wall thickening, diverticulosis or significant pericolonic fat stranding in the remnant large-bowel. Presacral space 5.9 x 2.6 x 12.9 cm collection with air-fluid level and surrounding ill-defined soft tissue and fat stranding (series 2/image 88). Vascular/Lymphatic: Atherosclerotic nonaneurysmal abdominal aorta. Patent portal, splenic, hepatic and renal veins. No pathologically enlarged lymph nodes in the abdomen or pelvis. Reproductive: Normal size prostate.  No ascites. Other: No pneumoperitoneum, ascites or focal fluid collection. Musculoskeletal: No aggressive appearing focal osseous lesions. Moderate degenerative disc disease at L5-S1. IMPRESSION: 1. Status post interval abdominal perineal resection and end colostomy. Large  parastomal hernia contains a small bowel loop. No evidence of bowel obstruction. 2. Ill-defined 10.5 x 2.9 x 4.4 cm abscess in the ventral left peritoneal cavity near the end ostomy site. 3. Presacral space 5.9 x 2.6 x 12.9 cm collection with air-fluid level and surrounding ill-defined soft tissue and fat stranding, favor expected post-APR surgical change, although infected collection difficult to exclude in this location. 4. Solid 1.8 cm lingular pulmonary nodule, increased from 1.4 cm on 12/29/2021 CT, compatible with enlarging pulmonary metastasis. 5. Ablation defect in the right liver measures 3.8 x 2.3 cm, slightly decreased from 4.0 x 2.8 cm on 12/29/2021 CT. No new liver lesions. 6. Layering 7 mm stone in the bladder. No hydronephrosis. Chronic mild diffuse bladder wall thickening, nonspecific. 7. Coronary atherosclerosis. 8.  Aortic Atherosclerosis (ICD10-I70.0). Electronically Signed   By: Ilona Sorrel M.D.   On: 03/16/2022 10:31    Procedures Procedures    Medications Ordered in ED Medications  0.9 % NaCl with KCl 20 mEq/ L  infusion ( Intravenous New Bag/Given 03/16/22 1322)  acetaminophen (TYLENOL) tablet 650 mg (has no administration in time range)    Or  acetaminophen (TYLENOL) suppository 650 mg (has no administration in time range)  HYDROmorphone (DILAUDID) injection 1 mg (has no administration in time range)  ondansetron (ZOFRAN) tablet 4 mg (has no administration in time range)    Or  ondansetron (ZOFRAN) injection 4 mg (has no administration in time range)  piperacillin-tazobactam (ZOSYN) IVPB 3.375 g (has no administration in time range)  lip balm (CARMEX) ointment (has no administration in time range)  HYDROmorphone (DILAUDID) injection 1 mg (1 mg Intravenous Given 03/16/22 0905)  ondansetron (ZOFRAN) injection 4 mg (4 mg Intravenous Given 03/16/22 0904)  sodium chloride 0.9 % bolus 1,000 mL (0 mLs Intravenous Stopped 03/16/22 1026)  sodium chloride (PF) 0.9 % injection (  Given  by Other 03/16/22 0947)  iohexol (OMNIPAQUE) 300 MG/ML solution 100 mL (100 mLs Intravenous Contrast Given 03/16/22 0925)  piperacillin-tazobactam (ZOSYN) IVPB 3.375 g (0 g Intravenous Stopped 03/16/22 1136)  lactated ringers bolus 1,000 mL (1,000 mLs Intravenous New Bag/Given 03/16/22 1214)    ED Course/ Medical Decision Making/ A&P                           Medical Decision Making Amount and/or Complexity of Data Reviewed  Labs: ordered. Radiology: ordered.  Risk Prescription drug management. Decision regarding hospitalization.   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  Having worsening pain around his ostomy site.  Per my record review he does have a history of a peristomal hernia.  No obvious obstructive findings otherwise.  We will obtain CT imaging.  Blood work.  Treat pain and nausea.  Reassess.  Lab work with a mild leukocytosis.  No significant LFT elevation.  CT scan of the abdomen pelvis with intra-abdominal abscess.  Start on Zosyn.  Discussed with general surgery recommended medical admission.  General surgery to see bedside.  The patients results and plan were reviewed and discussed.   Any x-rays performed were independently reviewed by myself.   Differential diagnosis were considered with the presenting HPI.  Medications  0.9 % NaCl with KCl 20 mEq/ L  infusion ( Intravenous New Bag/Given 03/16/22 1322)  acetaminophen (TYLENOL) tablet 650 mg (has no administration in time range)    Or  acetaminophen (TYLENOL) suppository 650 mg (has no administration in time range)  HYDROmorphone (DILAUDID) injection 1 mg (has no administration in time range)  ondansetron (ZOFRAN) tablet 4 mg (has no administration in time range)    Or  ondansetron (ZOFRAN) injection 4 mg (has no administration in time range)   piperacillin-tazobactam (ZOSYN) IVPB 3.375 g (has no administration in time range)  lip balm (CARMEX) ointment (has no administration in time range)  HYDROmorphone (DILAUDID) injection 1 mg (1 mg Intravenous Given 03/16/22 0905)  ondansetron (ZOFRAN) injection 4 mg (4 mg Intravenous Given 03/16/22 0904)  sodium chloride 0.9 % bolus 1,000 mL (0 mLs Intravenous Stopped 03/16/22 1026)  sodium chloride (PF) 0.9 % injection (  Given by Other 03/16/22 0947)  iohexol (OMNIPAQUE) 300 MG/ML solution 100 mL (100 mLs Intravenous Contrast Given 03/16/22 0925)  piperacillin-tazobactam (ZOSYN) IVPB 3.375 g (0 g Intravenous Stopped 03/16/22 1136)  lactated ringers bolus 1,000 mL (1,000 mLs Intravenous New Bag/Given 03/16/22 1214)    Vitals:   03/16/22 1115 03/16/22 1132 03/16/22 1136 03/16/22 1315  BP: 99/66 94/60  113/74  Pulse: 93 92  89  Resp:  16  18  Temp:   99.8 F (37.7 C) 99 F (37.2 C)  TempSrc:   Oral Oral  SpO2: 96% 94%  98%  Weight:      Height:        Final diagnoses:  Abscess of abdominal cavity (HCC)    Admission/ observation were discussed with the admitting physician, patient and/or family and they are comfortable with the plan.          Final Clinical Impression(s) / ED Diagnoses Final diagnoses:  Abscess of abdominal cavity [facility])    Rx / DC Orders ED Discharge Orders     None         Deno Etienne, DO 03/16/22 1527

## 2022-03-17 ENCOUNTER — Inpatient Hospital Stay: Payer: Medicaid Other

## 2022-03-17 ENCOUNTER — Inpatient Hospital Stay (HOSPITAL_COMMUNITY): Payer: Medicaid Other

## 2022-03-17 ENCOUNTER — Inpatient Hospital Stay: Payer: Medicaid Other | Admitting: Hematology

## 2022-03-17 DIAGNOSIS — K651 Peritoneal abscess: Secondary | ICD-10-CM | POA: Diagnosis not present

## 2022-03-17 LAB — COMPREHENSIVE METABOLIC PANEL
ALT: 21 U/L (ref 0–44)
AST: 14 U/L — ABNORMAL LOW (ref 15–41)
Albumin: 2.4 g/dL — ABNORMAL LOW (ref 3.5–5.0)
Alkaline Phosphatase: 121 U/L (ref 38–126)
Anion gap: 9 (ref 5–15)
BUN: 8 mg/dL (ref 6–20)
CO2: 22 mmol/L (ref 22–32)
Calcium: 8.6 mg/dL — ABNORMAL LOW (ref 8.9–10.3)
Chloride: 104 mmol/L (ref 98–111)
Creatinine, Ser: 0.65 mg/dL (ref 0.61–1.24)
GFR, Estimated: >60 mL/min (ref >60)
Glucose, Bld: 89 mg/dL (ref 70–99)
Potassium: 4.1 mmol/L (ref 3.5–5.1)
Sodium: 135 mmol/L (ref 135–145)
Total Bilirubin: 1.3 mg/dL — ABNORMAL HIGH (ref 0.3–1.2)
Total Protein: 6.5 g/dL (ref 6.5–8.1)

## 2022-03-17 LAB — CBC
HCT: 26.8 % — ABNORMAL LOW (ref 39.0–52.0)
Hemoglobin: 7.9 g/dL — ABNORMAL LOW (ref 13.0–17.0)
MCH: 28.1 pg (ref 26.0–34.0)
MCHC: 29.5 g/dL — ABNORMAL LOW (ref 30.0–36.0)
MCV: 95.4 fL (ref 80.0–100.0)
Platelets: 316 10*3/uL (ref 150–400)
RBC: 2.81 MIL/uL — ABNORMAL LOW (ref 4.22–5.81)
RDW: 16.3 % — ABNORMAL HIGH (ref 11.5–15.5)
WBC: 9.5 10*3/uL (ref 4.0–10.5)
nRBC: 0 % (ref 0.0–0.2)

## 2022-03-17 LAB — PROTIME-INR
INR: 1.4 — ABNORMAL HIGH (ref 0.8–1.2)
Prothrombin Time: 17.1 seconds — ABNORMAL HIGH (ref 11.4–15.2)

## 2022-03-17 LAB — HIV ANTIBODY (ROUTINE TESTING W REFLEX): HIV Screen 4th Generation wRfx: NONREACTIVE

## 2022-03-17 MED ORDER — SODIUM CHLORIDE 0.9 % IV SOLN
INTRAVENOUS | Status: AC | PRN
  Administered 2022-03-17: 999 mL/h via INTRAVENOUS

## 2022-03-17 MED ORDER — FLUMAZENIL 0.5 MG/5ML IV SOLN
INTRAVENOUS | Status: AC
  Filled 2022-03-17: qty 5

## 2022-03-17 MED ORDER — FENTANYL CITRATE (PF) 100 MCG/2ML IJ SOLN
INTRAMUSCULAR | Status: AC | PRN
  Administered 2022-03-17: 50 ug via INTRAVENOUS

## 2022-03-17 MED ORDER — MIDAZOLAM HCL 2 MG/2ML IJ SOLN
INTRAMUSCULAR | Status: AC | PRN
  Administered 2022-03-17: 1 mg via INTRAVENOUS

## 2022-03-17 MED ORDER — SODIUM CHLORIDE 0.9 % IV BOLUS
250.0000 mL | Freq: Once | INTRAVENOUS | Status: AC
  Administered 2022-03-17: 250 mL via INTRAVENOUS

## 2022-03-17 MED ORDER — FENTANYL CITRATE (PF) 100 MCG/2ML IJ SOLN
INTRAMUSCULAR | Status: AC
  Filled 2022-03-17: qty 2

## 2022-03-17 MED ORDER — LIDOCAINE HCL 1 % IJ SOLN
INTRAMUSCULAR | Status: AC | PRN
  Administered 2022-03-17: 10 mL via INTRADERMAL

## 2022-03-17 MED ORDER — NALOXONE HCL 0.4 MG/ML IJ SOLN
INTRAMUSCULAR | Status: AC
  Filled 2022-03-17: qty 1

## 2022-03-17 MED ORDER — SODIUM CHLORIDE 0.9% FLUSH
5.0000 mL | Freq: Three times a day (TID) | INTRAVENOUS | Status: DC
  Administered 2022-03-17 – 2022-03-22 (×15): 5 mL

## 2022-03-17 MED ORDER — MIDAZOLAM HCL 2 MG/2ML IJ SOLN
INTRAMUSCULAR | Status: AC
  Filled 2022-03-17: qty 4

## 2022-03-17 NOTE — TOC Progression Note (Signed)
Transition of Care [facility]) - Progression Note    Patient Details  Name: James Coffey MRN: [id number] Date of Birth: [birth year]  Transition of [facility]) CM/SW Contact  Purcell Mouton, RN Phone Number: 03/17/2022, 8:32 AM  Clinical Narrative:      Transition of Care (TOC) Screening Note   Patient Details  Name: James Coffey Date of Birth: 08/20/1970   Transition of Care [facility]) CM/SW Contact:    Purcell Mouton, RN Phone Number: 03/17/2022, 8:32 AM    Transition of Care Department (TOC) has reviewed patient and no TOC needs have been identified at this time. We will continue to monitor patient advancement through interdisciplinary progression rounds. If new patient transition needs arise, please place a TOC consult.         Expected Discharge Plan and Services                                                 Social Determinants of Health (SDOH) Interventions    Readmission Risk Interventions     No data to display

## 2022-03-17 NOTE — Progress Notes (Addendum)
Subjective: CC: Patient with continued pain on the left side of his abdomen underneath his stoma.  NPO.  No nausea or vomiting.  Having ostomy output.  Objective: Vital signs in last 24 hours: Temp:  [98.9 F (37.2 C)-100.2 F (37.9 C)] 99.8 F (37.7 C) (10/10 0513) Pulse Rate:  [74-93] 90 (10/10 0513) Resp:  [14-18] 18 (10/10 0725) BP: (84-113)/(49-74) 96/70 (10/10 0725) SpO2:  [94 %-98 %] 96 % (10/10 0513) Last BM Date : 03/16/22 (ostomy)  Intake/Output from previous day: 10/09 0701 - 10/10 0700 In: 3846.6 [I.V.:1655.5; IV Piggyback:2191.1] Out: 0947 [Urine:1275] Intake/Output this shift: No intake/output data recorded.  PE: Gen:  Alert, NAD, pleasant Pulm:  rate and effort normal Abd: Soft, mild distension, ttp across the mid/left abdomen below his stoma without rigidity or guarding, +BS. Peristomal hernia is soft. Stoma appaers pink, budded and viable. There is a full thickness ulceration/wound near stoma at ~10-1 o'clock, slightly raised as noted in picture below GU: Chaperone present. Perineal incision has completely broken down. There is thin, yellow/tan foul smelling fluid draining from this area.   Ext:  No LE edema  Psych: A&Ox3    Lab Results:  Recent Labs    03/16/22 0849 03/16/22 0902 03/17/22 0438  WBC 12.8*  --  9.5  HGB 9.6* 10.9* 7.9*  HCT 30.7* 32.0* 26.8*  PLT 408*  --  316   BMET Recent Labs    03/16/22 0849 03/16/22 0902 03/17/22 0438  NA 133* 134* 135  K 3.6 3.7 4.1  CL 100 100 104  CO2 23  --  22  GLUCOSE 206* 198* 89  BUN '12 10 8  ' CREATININE 0.86 0.70 0.65  CALCIUM 9.1  --  8.6*   PT/INR Recent Labs    03/17/22 0438  LABPROT 17.1*  INR 1.4*   CMP     Component Value Date/Time   NA 135 03/17/2022 0438   K 4.1 03/17/2022 0438   CL 104 03/17/2022 0438   CO2 22 03/17/2022 0438   GLUCOSE 89 03/17/2022 0438   BUN 8 03/17/2022 0438   CREATININE 0.65 03/17/2022 0438   CREATININE 0.76 01/20/2022 0821   CALCIUM 8.6  (L) 03/17/2022 0438   PROT 6.5 03/17/2022 0438   ALBUMIN 2.4 (L) 03/17/2022 0438   AST 14 (L) 03/17/2022 0438   AST 16 01/20/2022 0821   ALT 21 03/17/2022 0438   ALT 17 01/20/2022 0821   ALKPHOS 121 03/17/2022 0438   BILITOT 1.3 (H) 03/17/2022 0438   BILITOT 0.6 01/20/2022 0821   GFRNONAA >60 03/17/2022 0438   GFRNONAA >60 01/20/2022 0821   Lipase     Component Value Date/Time   LIPASE 25 03/16/2022 0849    Studies/Results: CT ABDOMEN PELVIS W CONTRAST  Result Date: 03/16/2022 CLINICAL DATA:  Acute abdominal pain, nonfocal. 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 Status post APR and conversion of loop colostomy to end ostomy 02/04/2022. * Tracking Code: BO * EXAM: CT ABDOMEN AND PELVIS WITH CONTRAST TECHNIQUE: Multidetector CT imaging of the abdomen and pelvis was performed using the standard protocol following bolus administration of intravenous contrast. RADIATION DOSE REDUCTION: This exam was performed according to the departmental dose-optimization program which includes automated exposure control, adjustment of the mA and/or kV according to patient size and/or use of iterative reconstruction technique. CONTRAST:  169m OMNIPAQUE IOHEXOL 300 MG/ML  SOLN COMPARISON:  12/29/2021 CT abdomen. 04/21/2021 CT chest, abdomen and pelvis. 07/13/2021 MRI abdomen and pelvis. FINDINGS: Lower chest:  Solid 1.8 cm lingular pulmonary nodule (series 6/image 24), increased from 1.4 cm on 12/29/2021 CT. Coronary atherosclerosis. Tip of superior approach central venous catheter seen at the cavoatrial junction. Hepatobiliary: Ablation defect in right liver measures 3.8 x 2.3 cm (series 2/image 28), slightly decreased from 4.0 x 2.8 cm on 12/29/2021 CT. Stable postsurgical changes from partial anterior left hepatectomy with adjacent surgical clips. No new liver lesions. Normal gallbladder with no radiopaque cholelithiasis. No biliary ductal dilatation. Pancreas: Normal, with no mass or duct dilation. Spleen:  Normal size. No mass. Adrenals/Urinary Tract: Normal adrenals. Simple 2.0 cm medial upper right renal cyst, for which no follow-up imaging is recommended. Otherwise normal kidneys, with no hydronephrosis. Layering 7 mm stone in bladder. Chronic mild diffuse bladder wall thickening. Stomach/Bowel: Normal non-distended stomach. Status post interval abdominal perineal resection and end colostomy. 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 Ill-defined 10.5 x 2.9 x 4.4 cm abscess in the ventral left peritoneal cavity with mildly thickened enhancing wall and internal gas (series 2/image 69). No significant small bowel dilatation or wall thickening. Appendix not discretely visualized. No wall thickening, diverticulosis or significant pericolonic fat stranding in the remnant large-bowel. Presacral space 5.9 x 2.6 x 12.9 cm collection with air-fluid level and surrounding ill-defined soft tissue and fat stranding (series 2/image 88). Vascular/Lymphatic: Atherosclerotic nonaneurysmal abdominal aorta. Patent portal, splenic, hepatic and renal veins. No pathologically enlarged lymph nodes in the abdomen or pelvis. Reproductive: Normal size prostate.  No ascites. Other: No pneumoperitoneum, ascites or focal fluid collection. Musculoskeletal: No aggressive appearing focal osseous lesions. Moderate degenerative disc disease at L5-S1. IMPRESSION: 1. Status post interval abdominal perineal resection and end colostomy. 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 No evidence of bowel obstruction. 2. Ill-defined 10.5 x 2.9 x 4.4 cm abscess in the ventral left peritoneal cavity near the end ostomy site. 3. Presacral space 5.9 x 2.6 x 12.9 cm collection with air-fluid level and surrounding ill-defined soft tissue and fat stranding, favor expected post-APR surgical change, although infected collection difficult to exclude in this location. 4. Solid 1.8 cm lingular pulmonary nodule, increased from 1.4 cm on 12/29/2021 CT,  compatible with enlarging pulmonary metastasis. 5. Ablation defect in the right liver measures 3.8 x 2.3 cm, slightly decreased from 4.0 x 2.8 cm on 12/29/2021 CT. No new liver lesions. 6. Layering 7 mm stone in the bladder. No hydronephrosis. Chronic mild diffuse bladder wall thickening, nonspecific. 7. Coronary atherosclerosis. 8.  Aortic Atherosclerosis (ICD10-I70.0). Electronically Signed   By: Ilona Sorrel M.D.   On: 03/16/2022 10:31    Anti-infectives: Anti-infectives (From admission, onward)    Start     Dose/Rate Route Frequency Ordered Stop   03/16/22 1900  piperacillin-tazobactam (ZOSYN) IVPB 3.375 g  Status:  Discontinued        3.375 g 100 mL/hr over 30 Minutes Intravenous Every 8 hours 03/16/22 1407 03/16/22 1408   03/16/22 1800  piperacillin-tazobactam (ZOSYN) IVPB 3.375 g        3.375 g 12.5 mL/hr over 240 Minutes Intravenous Every 8 hours 03/16/22 1409     03/16/22 1100  piperacillin-tazobactam (ZOSYN) IVPB 3.375 g        3.375 g 100 mL/hr over 30 Minutes Intravenous  Once 03/16/22 1050 03/16/22 1136        Assessment/Plan This is a 51 y.o. male with a hx of stage 4 rectal cancer (liver met s/p partial L hepatectomy by Dr. [name] 08/2021 and percutaneous ablation of a right liver tumor in  early May) who is s/p completed abdominal perineal resection, robotic approach and February 04, 2022 by Dr. [name].  - Patient with breakdown of perineal wound. Cont BID WTD dressing changes. CT w/ presacral space 5.9 x 2.6 x 12.9 cm collection with air-fluid level and surrounding ill-defined soft tissue and fat stranding. Reviewed this with my attending who recommends abx and IR drainage. Appears they are planning for this today.  - Patient also with ill-defined 10.5 x 2.9 x 4.4 cm abscess in the ventral left peritoneal cavity near the end ostomy site. Reviewed with attending. Would NOT recommend IR drainage of this area. Cont abx and monitor - Patient with parastomal hernia. This was repaired  primarily on 8/30. Appears to have recurred. No signs of obstructions clinically or on imaging. No indication for emergency surgery - There is a ulceration like wound near stoma at ~10-1 o'clock, interestingly slightly raised as noted in picture above  - seen with wocn who applied calcium alginate over wound for hemostatic properties. Reviewed with MD as well as reviewed the multiple different views of the admission CT. Nothing to do right now beside wound care recommended by wocn. Will need to be followed closely. - Notes difficulty getting in with ostomy clinic. I have sent a message to their clinic to ensure he can get an appointment for follow up - We will follow.    FEN - NPO for IR eval. Okay for cld after procedure. IVF per trh VTE - SCDs, okay for chem ppx from our standpoint ID - Zosyn   Solid 1.8 cm lingular pulmonary nodule, increased from 1.4 cm on 12/29/2021 CT, compatible with enlarging pulmonary metastasis - will need follow up with Dr. Irene Limbo. Appears CT chest was ordered as outpatient to further eval this.   Bladder stone   LOS: 1 day    Jillyn Ledger , [facility] Surgery 03/17/2022, 8:55 AM Please see Amion for pager number during day hours 7:00am-4:30pm

## 2022-03-17 NOTE — Progress Notes (Signed)
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  Imaging in the ED showed an ill-defined abscess in the ventral left peritoneal cavity as well as a presacral space fluid collection.  General surgery consulted, and IR consulted for drainage  Subjective / 24h Interval events: He is doing well this morning, continues to complain of abdominal discomfort.  Assesement and Plan: Principal Problem:   Intra-abdominal abscess ([place]) Active Problems:   Tobacco abuse   Rectal cancer metastasized to liver (HCC)   Hyponatremia   Mild protein malnutrition (HCC)   Hyperglycemia  Principal problem Intra-abdominal fluid collection-CT of the abdomen pelvis on admission showed an ill-defined 10.5 x 2.9 x 4.4 cm abscess interventional left peritoneal cavity, and also presacral space 5.9 x 2.6 x 12.9 cm collection favored to represent postsurgical changes but difficult to exclude infected fluid. -General surgery consulted, appreciate input.  IR consulted and he will get CT-guided drainage today.  Has been placed on Zosyn, continue, monitor abscess Gram stain and cultures postprocedure  Active problems Rectal cancer metastasized to the liver-this was incidentally diagnosed last year, when he was having repeated debridement surgeries for his Fournier's gangrene.  Follows with Dr. Irene Limbo with oncology, status post liver surgery as well as tumor removal, chemotherapy as well as radiation therapy. -Imaging does show concern for a lung metastatic lesion, with future plans for SBRT of isolated lung lesion per oncology.  Tobacco  use-nicotine patch, counseled for cessation  Hyperglycemia-A1c 5.5.  Possibly stress reaction in the setting of an active infection  Anemia of chronic illness-monitor hemoglobin, slight decrease today from 9.6 yesterday to 7.9, no bleeding, suspect dilutional as he has received fluids.  Hyponatremia-mild, sodium normalized today  Scheduled Meds: Continuous Infusions:  0.9 % NaCl with KCl 20 mEq / L 100 mL/hr at 03/16/22 2314   piperacillin-tazobactam (ZOSYN)  IV 3.375 g (03/17/22 0946)   PRN Meds:.acetaminophen **OR** acetaminophen, HYDROmorphone (DILAUDID) injection, lip balm, nicotine, ondansetron **OR** ondansetron (ZOFRAN) IV, traZODone  Current Outpatient Medications  Medication Instructions   acetaminophen (TYLENOL) 1,000 mg, Oral, Every 8 hours PRN   docusate sodium (COLACE) 100 mg, Oral, 2 times daily   ibuprofen (ADVIL) 400 mg, Oral, Daily PRN   loperamide (IMODIUM) 2 MG capsule Take 2 tablets by mouth at onset of diarrhea, then 1 tab every 2hrs until 12hr without a BM. May take 2 tab every 4hrs at bedtime. If diarrhea recurs repeat.   LORazepam (ATIVAN) 0.5 MG tablet Take 1 tablet by mouth every 6 hours as needed for anxiety.   oxyCODONE (OXY IR/ROXICODONE) 5 mg, Oral, Every 6 hours PRN   prochlorperazine (COMPAZINE) 10 MG tablet Take 1 tablet (10 mg total) by mouth every 6 (six) hours as needed for nausea or vomiting.    Diet Orders (From admission, onward)     Start     Ordered   03/17/22 0001  Diet NPO time specified  Diet effective midnight       Comments:     03/16/22 1632            DVT prophylaxis: SCDs Start: 03/16/22 1200   Lab Results  Component Value Date  PLT 316 03/17/2022      Code Status: Full Code  Family Communication: no family at bedside  Status is: Inpatient Remains inpatient appropriate because: Severity of illness, IV antibiotics  Level of care: Telemetry  Consultants:  General surgery  IR  Objective: Vitals:   03/17/22  0008 03/17/22 0513 03/17/22 0725 03/17/22 0942  BP: (!) 89/64 (!) 84/49 96/70 (!) 91/59  Pulse: 86 90  89  Resp: '14 18 18 17  '$ Temp: 99.1 F (37.3 C) 99.8 F (37.7 C)  99.4 F (37.4 C)  TempSrc: Oral Oral    SpO2: 98% 96%  96%  Weight:      Height:        Intake/Output Summary (Last 24 hours) at 03/17/2022 0958 Last data filed at 03/17/2022 0946 Gross per 24 hour  Intake 3846.58 ml  Output 1375 ml  Net 2471.58 ml   Wt Readings from Last 3 Encounters:  03/16/22 79.4 kg  02/07/22 80 kg  01/22/22 80.7 kg    Examination:  Constitutional: NAD Eyes: no scleral icterus ENMT: Mucous membranes are moist.  Neck: normal, supple Respiratory: clear to auscultation bilaterally, no wheezing, no crackles. Normal respiratory effort. Cardiovascular: Regular rate and rhythm, no murmurs / rubs / gallops. No LE edema.  Abdomen: Mild distention noted, no tenderness, ostomy in place with brown material Musculoskeletal: no clubbing / cyanosis.  Skin: no rashes Neurologic: non focal  Data Reviewed: I have independently reviewed following labs and imaging studies   CBC Recent Labs  Lab 03/16/22 0849 03/16/22 0902 03/17/22 0438  WBC 12.8*  --  9.5  HGB 9.6* 10.9* 7.9*  HCT 30.7* 32.0* 26.8*  PLT 408*  --  316  MCV 89.8  --  95.4  MCH 28.1  --  28.1  MCHC 31.3  --  29.5*  RDW 16.0*  --  16.3*    Recent Labs  Lab 03/16/22 0849 03/16/22 0902 03/17/22 0438  NA 133* 134* 135  K 3.6 3.7 4.1  CL 100 100 104  CO2 23  --  22  GLUCOSE 206* 198* 89  BUN '12 10 8  '$ CREATININE 0.86 0.70 0.65  CALCIUM 9.1  --  8.6*  AST 25  --  14*  ALT 31  --  21  ALKPHOS 153*  --  121  BILITOT 1.2  --  1.3*  ALBUMIN 3.0*  --  2.4*  MG 2.2  --   --   INR  --   --  1.4*  HGBA1C 5.5  --   --     ------------------------------------------------------------------------------------------------------------------ No results for input(s): "CHOL", "HDL", "LDLCALC", "TRIG", "CHOLHDL", "LDLDIRECT" in the  last 72 hours.  Lab Results  Component Value Date   HGBA1C 5.5 03/16/2022   ------------------------------------------------------------------------------------------------------------------ No results for input(s): "TSH", "T4TOTAL", "T3FREE", "THYROIDAB" in the last 72 hours.  Invalid input(s): "FREET3"  Cardiac Enzymes No results for input(s): "CKMB", "TROPONINI", "MYOGLOBIN" in the last 168 hours.  Invalid input(s): "CK" ------------------------------------------------------------------------------------------------------------------ No results found for: "BNP"  CBG: No results for input(s): "GLUCAP" in the last 168 hours.  No results found for this or any previous visit (from the past 240 hour(s)).   Radiology Studies: No results found.   Marzetta Board, MD, PhD [facility]  Between 7 am - 7 pm I am available, please contact me via Amion (for emergencies) or Securechat (non urgent messages)  Between 7 pm - 7 am I am not available, please contact night coverage MD/APP via Amion

## 2022-03-17 NOTE — Consult Note (Signed)
[place] Nurse ostomy consult note Stoma type/location: LUQ colostomy with painful ulceration and previously healed ulcerations in the parastomal field.  Circumferential parastomal hernia noted. Differential for ulceration includes but is not limited to pyoderma gangrenosum, although lesion is not consistent with typical presentation, malignancy. Patient with abdominal point tenderness in 4 quadrants upon light palpation.  Stomal assessment/size: round, 1 and 1/2 inches flush, moist.  Peristomal assessment: With full thickness, painful, friable ulceration measuring 1.5cm x 2cm x 0.1cm. Smooth, slick wound bed. Ulcer is located at 10 o'clock and presents also with evidence of previously healed ulcerations (scarring) from 10-1 o'clock Treatment options for stomal/peristomal skin: placement of calcium alginate Kellie Simmering [phone]) over wound for hemostatic properties and provision of moisture retentive environment for healing and comfort followed by placement of thin skin barrier ring Kellie Simmering # [phone]) around stoma prior to pouching. The pouch border is extended by 2-inches using barrier tape. Patient prefers to use a clamp for pouch closure over Lock and Roll closure mechanism. Clamp is Lawson # [id number]. Output: 196ms out of old pouch, brown, soft Ostomy pouching: 2pc. 2 and 3/4 inch pouching system with skin barrier ring and wound treatment as indicated above. Education provided:  [place].  Patient is seen with [facility] PA-C M. Maczis who will provide photodocumentation of the parastomal presentation. Filling of perianal defect with saline moistened gauze, ABD pad and securement with the patient's undergarment is performed. Patient for drain placement today in IR.  3 pouching set ups and a spare clamp and 2 pieces of calcium alginate are provided to bedside.  Patient is to follow with outpatient ostomy clinic.   [facility] will follow while in house,  and will remain available to this patient, the nursing, surgical and medical teams.   Thank you for inviting [place] participate in this patient's Plan of Care.  LMaudie Flakes MSN, RN, CNS, GFentress CSerita Grammes [employer] FUnis[employer] Corporationphone:  [phone]

## 2022-03-17 NOTE — Procedures (Signed)
Interventional Radiology Procedure Note  Procedure: CT Guided Drainage of posterior pelvic/presacral abscess  Complications: None  Estimated Blood Loss: < 10 mL  Findings: 10 Fr drain placed in lower presacral space from left transgluteal approach with return of purulent fluid. Fluid sample sent for culture analysis. Drain attached to suction bulb drainage.  Will follow.  Venetia Night. Kathlene Cote, M.D Pager:  [phone]

## 2022-03-18 ENCOUNTER — Other Ambulatory Visit: Payer: Self-pay

## 2022-03-18 DIAGNOSIS — D649 Anemia, unspecified: Secondary | ICD-10-CM

## 2022-03-18 DIAGNOSIS — R739 Hyperglycemia, unspecified: Secondary | ICD-10-CM

## 2022-03-18 DIAGNOSIS — K651 Peritoneal abscess: Secondary | ICD-10-CM | POA: Diagnosis not present

## 2022-03-18 LAB — COMPREHENSIVE METABOLIC PANEL
ALT: 23 U/L (ref 0–44)
AST: 24 U/L (ref 15–41)
Albumin: 2.3 g/dL — ABNORMAL LOW (ref 3.5–5.0)
Alkaline Phosphatase: 156 U/L — ABNORMAL HIGH (ref 38–126)
Anion gap: 7 (ref 5–15)
BUN: 8 mg/dL (ref 6–20)
CO2: 24 mmol/L (ref 22–32)
Calcium: 8.2 mg/dL — ABNORMAL LOW (ref 8.9–10.3)
Chloride: 104 mmol/L (ref 98–111)
Creatinine, Ser: 0.7 mg/dL (ref 0.61–1.24)
GFR, Estimated: >60 mL/min (ref >60)
Glucose, Bld: 129 mg/dL — ABNORMAL HIGH (ref 70–99)
Potassium: 3.7 mmol/L (ref 3.5–5.1)
Sodium: 135 mmol/L (ref 135–145)
Total Bilirubin: 1 mg/dL (ref 0.3–1.2)
Total Protein: 6.6 g/dL (ref 6.5–8.1)

## 2022-03-18 LAB — CBC
HCT: 24.4 % — ABNORMAL LOW (ref 39.0–52.0)
Hemoglobin: 7.5 g/dL — ABNORMAL LOW (ref 13.0–17.0)
MCH: 27.8 pg (ref 26.0–34.0)
MCHC: 30.7 g/dL (ref 30.0–36.0)
MCV: 90.4 fL (ref 80.0–100.0)
Platelets: 312 10*3/uL (ref 150–400)
RBC: 2.7 MIL/uL — ABNORMAL LOW (ref 4.22–5.81)
RDW: 16.4 % — ABNORMAL HIGH (ref 11.5–15.5)
WBC: 9.7 10*3/uL (ref 4.0–10.5)
nRBC: 0.2 % (ref 0.0–0.2)

## 2022-03-18 LAB — MAGNESIUM: Magnesium: 2.3 mg/dL (ref 1.7–2.4)

## 2022-03-18 MED ORDER — SODIUM CHLORIDE 0.9 % IV BOLUS
500.0000 mL | Freq: Once | INTRAVENOUS | Status: AC
  Administered 2022-03-18: 500 mL via INTRAVENOUS

## 2022-03-18 NOTE — Progress Notes (Signed)
   03/18/22 0529  Assess: MEWS Score  Temp (!) 102.1 F (38.9 C)  BP (!) 89/65  MAP (mmHg) 73  Pulse Rate 90  Resp 19  SpO2 93 %  O2 Device Room Air  Assess: MEWS Score  MEWS Temp 2  MEWS Systolic 1  MEWS Pulse 0  MEWS RR 0  MEWS LOC 0  MEWS Score 3  MEWS Score Color Yellow  Assess: if the MEWS score is Yellow or Red  Were vital signs taken at a resting state? Yes  Focused Assessment Change from prior assessment (see assessment flowsheet)  Does the patient meet 2 or more of the SIRS criteria? No  Does the patient have a confirmed or suspected source of infection? Yes  Provider and Rapid Response Notified? Yes  MEWS guidelines implemented *See Row Information* Yes  Treat  Pain Scale 0-10  Pain Score 5  Faces Pain Scale 2  Pain Type Acute pain  Pain Location Abdomen  Pain Orientation Anterior  Pain Descriptors / Indicators Aching;Constant  Pain Frequency Constant  Pain Onset On-going  Patients Stated Pain Goal 0  Pain Intervention(s) Medication (See eMAR)  Multiple Pain Sites No  Take Vital Signs  Increase Vital Sign Frequency  Yellow: Q 2hr X 2 then Q 4hr X 2, if remains yellow, continue Q 4hrs  Escalate  MEWS: Escalate Yellow: discuss with charge nurse/RN and consider discussing with provider and RRT  Notify: Charge Nurse/RN  Name of Charge Nurse/RN Notified Bishnu, RN  Date Charge Nurse/RN Notified 03/18/22  Time Charge Nurse/RN Notified 0530  Notify: Provider  Provider Name/Title J. Olena Heckle  Date Provider Notified 03/18/22  Time Provider Notified 0530  Method of Notification Page  Notification Reason Change in status  Provider response See new orders;Evaluate remotely  Date of Provider Response 03/18/22  Time of Provider Response 0530  Assess: SIRS CRITERIA  SIRS Temperature  1  SIRS Pulse 0  SIRS Respirations  0  SIRS WBC 1  SIRS Score Sum  2

## 2022-03-18 NOTE — Progress Notes (Addendum)
Subjective: CC: S/p IR drain placed in lower presacral space from left transgluteal approach with return of purulent fluid yesterday. 150+cc out over the last 24 hours Fever 102.1 overnight and soft bp Abdominal pain improved. Still inferior to ostomy but feels he can mobilize better now. Tolerating regular diet without n/v. Having air from ostomy but no liquid/stool output yesterday.   Objective: Vital signs in last 24 hours: Temp:  [98.4 F (36.9 C)-102.1 F (38.9 C)] 98.4 F (36.9 C) (10/11 0921) Pulse Rate:  [81-94] 87 (10/11 0921) Resp:  [17-19] 17 (10/11 0921) BP: (84-99)/(54-67) 97/54 (10/11 0921) SpO2:  [92 %-98 %] 97 % (10/11 0921) Last BM Date : 03/16/22 (ostomy)  Intake/Output from previous day: 10/10 0701 - 10/11 0700 In: 3647.9 [P.O.:810; I.V.:2734.4; IV Piggyback:93.5] Out: 8675 [Urine:1150; Drains:154] Intake/Output this shift: Total I/O In: 815.4 [P.O.:360; I.V.:388.3; IV Piggyback:67.1] Out: 720 [Urine:700; Drains:20]  PE: Gen:  Alert, NAD, pleasant Pulm:  rate and effort normal Abd: Soft, mild distension, ttp across the mid/left abdomen below his stoma without rigidity or guarding, +BS. Peristomal hernia is soft. Stoma appaers pink, budded and viable. IR drain with thin, yellow, purulent output in bulb.  GU: Perineal incision has completely broken down. When evaluating this area you can see IR drain tubing and bulb loses suction if the area is gently packed.  Ext:  No LE edema  Psych: A&Ox3   Lab Results:  Recent Labs    03/17/22 0438 03/18/22 0108  WBC 9.5 9.7  HGB 7.9* 7.5*  HCT 26.8* 24.4*  PLT 316 312   BMET Recent Labs    03/17/22 0438 03/18/22 0108  NA 135 135  K 4.1 3.7  CL 104 104  CO2 22 24  GLUCOSE 89 129*  BUN 8 8  CREATININE 0.65 0.70  CALCIUM 8.6* 8.2*   PT/INR Recent Labs    03/17/22 0438  LABPROT 17.1*  INR 1.4*   CMP     Component Value Date/Time   NA 135 03/18/2022 0108   K 3.7 03/18/2022 0108   CL 104  03/18/2022 0108   CO2 24 03/18/2022 0108   GLUCOSE 129 (H) 03/18/2022 0108   BUN 8 03/18/2022 0108   CREATININE 0.70 03/18/2022 0108   CREATININE 0.76 01/20/2022 0821   CALCIUM 8.2 (L) 03/18/2022 0108   PROT 6.6 03/18/2022 0108   ALBUMIN 2.3 (L) 03/18/2022 0108   AST 24 03/18/2022 0108   AST 16 01/20/2022 0821   ALT 23 03/18/2022 0108   ALT 17 01/20/2022 0821   ALKPHOS 156 (H) 03/18/2022 0108   BILITOT 1.0 03/18/2022 0108   BILITOT 0.6 01/20/2022 0821   GFRNONAA >60 03/18/2022 0108   GFRNONAA >60 01/20/2022 0821   Lipase     Component Value Date/Time   LIPASE 25 03/16/2022 0849    Studies/Results: CT GUIDED VISCERAL FLUID DRAIN BY PERC CATH  Result Date: 03/17/2022 CLINICAL DATA:  Postoperative presacral and posterior pelvic abscess after AP resection of rectal carcinoma. EXAM: CT GUIDED CATHETER DRAINAGE OF PRESACRAL PERITONEAL ABSCESS ANESTHESIA/SEDATION: Moderate (conscious) sedation was employed during this procedure. A total of Versed 2.0 mg and Fentanyl 50 mcg was administered intravenously by radiology nursing. Moderate Sedation Time: 28 minutes. The patient's level of consciousness and vital signs were monitored continuously by radiology nursing throughout the procedure under my direct supervision. PROCEDURE: The procedure, risks, benefits, and alternatives were explained to the patient. Questions regarding the procedure were encouraged and answered. The patient understands and  consents to the procedure. A time out was performed prior to initiating the procedure. CT was performed through the pelvis in a decubitus position with the right side down and the left side up. The left transgluteal region was prepped with chlorhexidine in a sterile fashion, and a sterile drape was applied covering the operative field. A sterile gown and sterile gloves were used for the procedure. Local anesthesia was provided with 1% Lidocaine. An 18 gauge trocar needle was advanced under CT guidance from  a left transgluteal approach to the level of a lower presacral pelvic abscess. After confirming needle tip position, a guidewire was advanced into the collection. The tract was dilated over the wire and a 10 French percutaneous drainage catheter advanced over the wire. Drain position was confirmed by CT. A fluid sample was aspirated from the drain and sent for culture analysis. The drainage catheter was attached to suction bulb drainage and secured at the skin with a Prolene retention suture and adhesive retention device. RADIATION DOSE REDUCTION: This exam was performed according to the departmental dose-optimization program which includes automated exposure control, adjustment of the mA and/or kV according to patient size and/or use of iterative reconstruction technique. COMPLICATIONS: None FINDINGS: After placement of the drainage catheter from a left transgluteal approach into the elongated presacral collection containing fluid and air, there was return of both air and milky purulent fluid. A fluid sample was sent for culture analysis. IMPRESSION: CT-guided percutaneous catheter drainage of presacral abscess. A 10 French percutaneous drainage catheter was placed via a left transgluteal approach. A sample of purulent fluid was sent for culture analysis. Electronically Signed   By: Aletta Edouard M.D.   On: 03/17/2022 17:13    Anti-infectives: Anti-infectives (From admission, onward)    Start     Dose/Rate Route Frequency Ordered Stop   03/16/22 1900  piperacillin-tazobactam (ZOSYN) IVPB 3.375 g  Status:  Discontinued        3.375 g 100 mL/hr over 30 Minutes Intravenous Every 8 hours 03/16/22 1407 03/16/22 1408   03/16/22 1800  piperacillin-tazobactam (ZOSYN) IVPB 3.375 g        3.375 g 12.5 mL/hr over 240 Minutes Intravenous Every 8 hours 03/16/22 1409     03/16/22 1100  piperacillin-tazobactam (ZOSYN) IVPB 3.375 g        3.375 g 100 mL/hr over 30 Minutes Intravenous  Once 03/16/22 1050 03/16/22  1136        Assessment/Plan This is a 51 y.o. male with a hx of stage 4 rectal cancer (liver met s/p partial L hepatectomy by Dr. [name] 08/2021 and percutaneous ablation of a right liver tumor in early May) who is s/p completed abdominal perineal resection, robotic approach and February 04, 2022 by Dr. [name].  - Patient with breakdown of perineal wound. CT w/ presacral space 5.9 x 2.6 x 12.9 cm collection with air-fluid level and surrounding ill-defined soft tissue and fat stranding. S/p IR drain 10/10. Cx's pending. Gram stain with gram + cocci and rods. IR drain looses suction if perineal wound is packed - discussed with MD, will keep perineal wound covered externally for now so IR drain can keep suction and consult plastics for possible flap. Cont abx  - Patient also with ill-defined 10.5 x 2.9 x 4.4 cm abscess in the ventral left peritoneal cavity near the end ostomy site. Reviewed with attending. Would NOT recommend IR drainage of this area. Cont abx and monitor - Patient with parastomal hernia. This was repaired primarily on 8/30.  Appears to have recurred. Cont to monitor.  - Wound near stoma at ~10-1 o'clock noted yesterday - seen with wocn who applied calcium alginate over wound. Reviewed with MD as well as reviewed the multiple different views of the admission CT. Nothing to do right now beside wound care recommended by wocn. I am going to arrange a time that attending and I can evaluate this together later today vs in am.  - Notes difficulty getting in with ostomy clinic. I have sent a message to their clinic to ensure he can get an appointment for follow up - We will follow.     Addendum: Discussed case with our colorectal surgeons. Given how far out the patient is and that he is still smoking, he is not likely able to be flapped at this time. Will let plastics know they can hold on consultation. They recommended continuing to pack the wound and if the IR JP is losing suction we can switch  to a gravity bag. Cont abx and await cx's.    FEN - Reg. IVF per trh VTE - SCDs, okay for chem ppx from our standpoint ID - Zosyn   Solid 1.8 cm lingular pulmonary nodule, increased from 1.4 cm on 12/29/2021 CT, compatible with enlarging pulmonary metastasis - will need follow up with Dr. Irene Limbo. Appears CT chest was ordered as outpatient to further eval this.    Bladder stone   LOS: 2 days    Jillyn Ledger , [facility] 03/18/2022, 12:31 PM Please see Amion for pager number during day hours 7:00am-4:30pm

## 2022-03-18 NOTE — Consult Note (Signed)
Referring Physician(s): Dr. Autumn Messing   Supervising Physician: Corrie Mckusick  Patient Status:  [facility] - In-pt  Chief Complaint: Intra-abdominal abscess  Subjective: Resting comfortably.  Lying on right side.  L TG drain in place with cloudy, yellow output.   Allergies: Hydrocodone  Medications: Prior to Admission medications   Medication Sig Start Date End Date Taking? Authorizing Provider  acetaminophen (TYLENOL) 500 MG tablet Take 2 tablets (1,000 mg total) by mouth every 8 (eight) hours as needed for mild pain or fever. Patient taking differently: Take 1,000 mg by mouth daily as needed for mild pain or fever. 12/19/20  Yes Barkley Boards R, PA-C  ibuprofen (ADVIL) 200 MG tablet Take 400 mg by mouth daily as needed for moderate pain.   Yes [provider]  docusate sodium (COLACE) 100 MG capsule Take 1 capsule (100 mg total) by mouth 2 (two) times daily. Patient not taking: Reported on 01/21/2022 08/31/21   Dwan Bolt, MD  loperamide (IMODIUM) 2 MG capsule Take 2 tablets by mouth at onset of diarrhea, then 1 tab every 2hrs until 12hr without a BM. May take 2 tab every 4hrs at bedtime. If diarrhea recurs repeat. Patient not taking: Reported on 09/29/2021 05/07/21   [name], MD  LORazepam (ATIVAN) 0.5 MG tablet Take 1 tablet by mouth every 6 hours as needed for anxiety. Patient not taking: Reported on 01/21/2022 11/05/21   [name], MD  oxyCODONE (OXY IR/ROXICODONE) 5 MG immediate release tablet Take 1 tablet (5 mg total) by mouth every 6 (six) hours as needed for moderate pain, severe pain or breakthrough pain. Patient not taking: Reported on 03/16/2022 02/08/22   Coralie Keens, MD  prochlorperazine (COMPAZINE) 10 MG tablet Take 1 tablet (10 mg total) by mouth every 6 (six) hours as needed for nausea or vomiting. Patient not taking: Reported on 01/21/2022 11/05/21   [name], MD     Vital Signs: BP 102/74 (BP Location: Left Arm)    Pulse 88   Temp 99.7 F (37.6 C) (Oral)   Resp 17   Ht '5\' 9"'$  (1.753 m)   Wt 175 lb (79.4 kg)   SpO2 99%   BMI 25.84 kg/m   Physical Exam NAD, awake Abdomen: L TG drain in place with cloudy, yellow output. Insertion site intact.  TTP.  Flushes easily, some tenderness with flushing.  Return of air/fluid with aspiration.   Imaging: CT GUIDED VISCERAL FLUID DRAIN BY PERC CATH  Result Date: 03/17/2022 CLINICAL DATA:  Postoperative presacral and posterior pelvic abscess after AP resection of rectal carcinoma. EXAM: CT GUIDED CATHETER DRAINAGE OF PRESACRAL PERITONEAL ABSCESS ANESTHESIA/SEDATION: Moderate (conscious) sedation was employed during this procedure. A total of Versed 2.0 mg and Fentanyl 50 mcg was administered intravenously by radiology nursing. Moderate Sedation Time: 28 minutes. The patient's level of consciousness and vital signs were monitored continuously by radiology nursing throughout the procedure under my direct supervision. PROCEDURE: The procedure, risks, benefits, and alternatives were explained to the patient. Questions regarding the procedure were encouraged and answered. The patient understands and consents to the procedure. A time out was performed prior to initiating the procedure. CT was performed through the pelvis in a decubitus position with the right side down and the left side up. The left transgluteal region was prepped with chlorhexidine in a sterile fashion, and a sterile drape was applied covering the operative field. A sterile gown and sterile gloves were used for the procedure. Local anesthesia was provided  with 1% Lidocaine. An 18 gauge trocar needle was advanced under CT guidance from a left transgluteal approach to the level of a lower presacral pelvic abscess. After confirming needle tip position, a guidewire was advanced into the collection. The tract was dilated over the wire and a 10 French percutaneous drainage catheter advanced over the wire. Drain position  was confirmed by CT. A fluid sample was aspirated from the drain and sent for culture analysis. The drainage catheter was attached to suction bulb drainage and secured at the skin with a Prolene retention suture and adhesive retention device. RADIATION DOSE REDUCTION: This exam was performed according to the departmental dose-optimization program which includes automated exposure control, adjustment of the mA and/or kV according to patient size and/or use of iterative reconstruction technique. COMPLICATIONS: None FINDINGS: After placement of the drainage catheter from a left transgluteal approach into the elongated presacral collection containing fluid and air, there was return of both air and milky purulent fluid. A fluid sample was sent for culture analysis. IMPRESSION: CT-guided percutaneous catheter drainage of presacral abscess. A 10 French percutaneous drainage catheter was placed via a left transgluteal approach. A sample of purulent fluid was sent for culture analysis. Electronically Signed   By: Aletta Edouard M.D.   On: 03/17/2022 17:13   CT ABDOMEN PELVIS W CONTRAST  Result Date: 03/16/2022 CLINICAL DATA:  Acute abdominal pain, nonfocal. 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 Status post APR and conversion of loop colostomy to end ostomy 02/04/2022. * Tracking Code: BO * EXAM: CT ABDOMEN AND PELVIS WITH CONTRAST TECHNIQUE: Multidetector CT imaging of the abdomen and pelvis was performed using the standard protocol following bolus administration of intravenous contrast. RADIATION DOSE REDUCTION: This exam was performed according to the departmental dose-optimization program which includes automated exposure control, adjustment of the mA and/or kV according to patient size and/or use of iterative reconstruction technique. CONTRAST:  131m OMNIPAQUE IOHEXOL 300 MG/ML  SOLN COMPARISON:  12/29/2021 CT abdomen. 04/21/2021 CT chest, abdomen and pelvis. 07/13/2021 MRI abdomen and pelvis. FINDINGS: Lower  chest: Solid 1.8 cm lingular pulmonary nodule (series 6/image 24), increased from 1.4 cm on 12/29/2021 CT. Coronary atherosclerosis. Tip of superior approach central venous catheter seen at the cavoatrial junction. Hepatobiliary: Ablation defect in right liver measures 3.8 x 2.3 cm (series 2/image 28), slightly decreased from 4.0 x 2.8 cm on 12/29/2021 CT. Stable postsurgical changes from partial anterior left hepatectomy with adjacent surgical clips. No new liver lesions. Normal gallbladder with no radiopaque cholelithiasis. No biliary ductal dilatation. Pancreas: Normal, with no mass or duct dilation. Spleen: Normal size. No mass. Adrenals/Urinary Tract: Normal adrenals. Simple 2.0 cm medial upper right renal cyst, for which no follow-up imaging is recommended. Otherwise normal kidneys, with no hydronephrosis. Layering 7 mm stone in bladder. Chronic mild diffuse bladder wall thickening. Stomach/Bowel: Normal non-distended stomach. Status post interval abdominal perineal resection and end colostomy. 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 Ill-defined 10.5 x 2.9 x 4.4 cm abscess in the ventral left peritoneal cavity with mildly thickened enhancing wall and internal gas (series 2/image 69). No significant small bowel dilatation or wall thickening. Appendix not discretely visualized. No wall thickening, diverticulosis or significant pericolonic fat stranding in the remnant large-bowel. Presacral space 5.9 x 2.6 x 12.9 cm collection with air-fluid level and surrounding ill-defined soft tissue and fat stranding (series 2/image 88). Vascular/Lymphatic: Atherosclerotic nonaneurysmal abdominal aorta. Patent portal, splenic, hepatic and renal veins. No pathologically enlarged lymph nodes in the abdomen or pelvis. Reproductive: Normal  size prostate.  No ascites. Other: No pneumoperitoneum, ascites or focal fluid collection. Musculoskeletal: No aggressive appearing focal osseous lesions. Moderate degenerative  disc disease at L5-S1. IMPRESSION: 1. Status post interval abdominal perineal resection and end colostomy. 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 No evidence of bowel obstruction. 2. Ill-defined 10.5 x 2.9 x 4.4 cm abscess in the ventral left peritoneal cavity near the end ostomy site. 3. Presacral space 5.9 x 2.6 x 12.9 cm collection with air-fluid level and surrounding ill-defined soft tissue and fat stranding, favor expected post-APR surgical change, although infected collection difficult to exclude in this location. 4. Solid 1.8 cm lingular pulmonary nodule, increased from 1.4 cm on 12/29/2021 CT, compatible with enlarging pulmonary metastasis. 5. Ablation defect in the right liver measures 3.8 x 2.3 cm, slightly decreased from 4.0 x 2.8 cm on 12/29/2021 CT. No new liver lesions. 6. Layering 7 mm stone in the bladder. No hydronephrosis. Chronic mild diffuse bladder wall thickening, nonspecific. 7. Coronary atherosclerosis. 8.  Aortic Atherosclerosis (ICD10-I70.0). Electronically Signed   By: Ilona Sorrel M.D.   On: 03/16/2022 10:31    Labs:  CBC: Recent Labs    02/06/22 0510 03/16/22 0849 03/16/22 0902 03/17/22 0438 03/18/22 0108  WBC 8.8 12.8*  --  9.5 9.7  HGB 10.3* 9.6* 10.9* 7.9* 7.5*  HCT 31.2* 30.7* 32.0* 26.8* 24.4*  PLT 169 408*  --  316 312    COAGS: Recent Labs    10/02/21 0907 03/17/22 0438  INR 1.0 1.4*    BMP: Recent Labs    02/06/22 0510 03/16/22 0849 03/16/22 0902 03/17/22 0438 03/18/22 0108  NA 139 133* 134* 135 135  K 3.8 3.6 3.7 4.1 3.7  CL 107 100 100 104 104  CO2 25 23  --  22 24  GLUCOSE 111* 206* 198* 89 129*  BUN '9 12 10 8 8  '$ CALCIUM 8.6* 9.1  --  8.6* 8.2*  CREATININE 0.70 0.86 0.70 0.65 0.70  GFRNONAA >60 >60  --  >60 >60    LIVER FUNCTION TESTS: Recent Labs    01/20/22 0821 03/16/22 0849 03/17/22 0438 03/18/22 0108  BILITOT 0.6 1.2 1.3* 1.0  AST 16 25 14* 24  ALT '17 31 21 23  '$ ALKPHOS 92 153* 121 156*  PROT 7.2 8.1 6.5  6.6  ALBUMIN 3.7 3.0* 2.4* 2.3*    Assessment and Plan: Presacral fluid collection in the setting of stage IV rectal cancer s/p abdominal perineal resection 02/04/22, now with perineal wound breakdown Drain placed yesterday by Dr. Kathlene Cote into the presacral air-fluid collection.  Drain with ongoing significant output, purulent in appearance.  Discussed with surgery team.  Perineal wound cannot be adequately packed without disrupting charge to TG drain.  Surgery requesting change from bulb suction to gravity drainage.  Patient converted to gravity bag by Rowe Robert, PA.    IR will continue to follow.   Electronically Signed: Docia Barrier, PA 03/18/2022, 1:55 PM   I spent a total of 15 Minutes at the the patient's bedside AND on the patient's hospital floor or unit, greater than 50% of which was counseling/coordinating care for presacral fluid collection.

## 2022-03-18 NOTE — Progress Notes (Signed)
  Progress Note   Patient: James Coffey XAJ:[id number] DOB: April 23, 1971 DOA: 03/16/2022     2 DOS: the patient was seen and examined on 03/18/2022   Brief hospital course:  Assessment and Plan: Intra-abdominal fluid collection --CT of the abdomen pelvis on admission showed an ill-defined 10.5 x 2.9 x 4.4 cm abscess interventional left peritoneal cavity, and also presacral space 5.9 x 2.6 x 12.9 cm collection favored to represent postsurgical changes but difficult to exclude infected fluid. --General surgery consulted.  IR consulted and patient improved s/o CT-guided drainage.   --Zosyn. Monitor abscess Gram stain and cultures postprocedure, abundant GNR thus far   Rectal cancer metastasized to the liver --followed by Dr. Irene Limbo, status post liver surgery as well as tumor removal, chemotherapy as well as radiation therapy. --Imaging does show concern for a lung metastatic lesion, with future plans for SBRT of isolated lung lesion per oncology.   Tobacco use --nicotine patch, counseled for cessation   Hyperglycemia --A1c 5.5.  Stress reaction in the setting of an active infection   Anemia of acute/chronic illness --stable   Hyponatremia --resolved     Subjective:  Feels better Ate today Less pain  Physical Exam: Vitals:   03/18/22 0654 03/18/22 0729 03/18/22 0921 03/18/22 1311  BP: (!) 85/56 (!) 84/55 (!) 97/54 102/74  Pulse: 87 81 87 88  Resp: '18 18 17 17  '$ Temp: 98.5 F (36.9 C) 98.6 F (37 C) 98.4 F (36.9 C) 99.7 F (37.6 C)  TempSrc: Oral Oral Oral Oral  SpO2: 96% 96% 97% 99%  Weight:      Height:       Physical Exam Vitals reviewed.  Constitutional:      General: He is not in acute distress.    Appearance: He is not ill-appearing or toxic-appearing.  Cardiovascular:     Rate and Rhythm: Normal rate and regular rhythm.     Heart sounds: No murmur heard. Pulmonary:     Effort: Pulmonary effort is normal. No respiratory distress.     Breath sounds: No  wheezing, rhonchi or rales.  Musculoskeletal:     Right lower leg: No edema.     Left lower leg: No edema.  Neurological:     Mental Status: He is alert.  Psychiatric:        Behavior: Behavior normal.     Data Reviewed:  Hgb stable 7.5  Family Communication: none  Disposition: Status is: Inpatient Remains inpatient appropriate because: intraabdominal abscess  Planned Discharge Destination: Home    Time spent: 35 minutes  Author: Murray Hodgkins, MD 03/18/2022 6:04 PM  For on call review [email or web address].

## 2022-03-19 ENCOUNTER — Other Ambulatory Visit: Payer: Self-pay

## 2022-03-19 ENCOUNTER — Other Ambulatory Visit (HOSPITAL_COMMUNITY): Payer: Medicaid Other

## 2022-03-19 DIAGNOSIS — C787 Secondary malignant neoplasm of liver and intrahepatic bile duct: Secondary | ICD-10-CM | POA: Diagnosis not present

## 2022-03-19 DIAGNOSIS — K651 Peritoneal abscess: Secondary | ICD-10-CM | POA: Diagnosis not present

## 2022-03-19 DIAGNOSIS — C20 Malignant neoplasm of rectum: Secondary | ICD-10-CM | POA: Diagnosis not present

## 2022-03-19 MED ORDER — DOCUSATE SODIUM 100 MG PO CAPS
100.0000 mg | ORAL_CAPSULE | Freq: Two times a day (BID) | ORAL | Status: DC
  Administered 2022-03-19 – 2022-03-22 (×7): 100 mg via ORAL
  Filled 2022-03-19 (×7): qty 1

## 2022-03-19 MED ORDER — SODIUM CHLORIDE 0.9 % IV BOLUS
1000.0000 mL | Freq: Once | INTRAVENOUS | Status: AC
  Administered 2022-03-19: 1000 mL via INTRAVENOUS

## 2022-03-19 MED ORDER — HYDROMORPHONE HCL 1 MG/ML IJ SOLN: 0.5000 mg | INTRAMUSCULAR | Status: DC | PRN

## 2022-03-19 MED ORDER — BENZONATATE 100 MG PO CAPS
200.0000 mg | ORAL_CAPSULE | Freq: Three times a day (TID) | ORAL | Status: DC | PRN
  Administered 2022-03-19: 200 mg via ORAL
  Filled 2022-03-19: qty 2

## 2022-03-19 MED ORDER — OXYCODONE HCL 5 MG PO TABS
5.0000 mg | ORAL_TABLET | ORAL | Status: DC | PRN
  Administered 2022-03-19 – 2022-03-21 (×7): 10 mg via ORAL
  Filled 2022-03-19 (×7): qty 2

## 2022-03-19 MED ORDER — GUAIFENESIN ER 600 MG PO TB12
600.0000 mg | ORAL_TABLET | Freq: Two times a day (BID) | ORAL | Status: DC
  Administered 2022-03-19 – 2022-03-22 (×7): 600 mg via ORAL
  Filled 2022-03-19 (×7): qty 1

## 2022-03-19 MED ORDER — POLYETHYLENE GLYCOL 3350 17 G PO PACK
17.0000 g | PACK | Freq: Every day | ORAL | Status: DC
  Administered 2022-03-19 – 2022-03-22 (×4): 17 g via ORAL
  Filled 2022-03-19 (×4): qty 1

## 2022-03-19 NOTE — Progress Notes (Signed)
Referring Physician(s): Dr. Marlou Starks   Supervising Physician: Corrie Mckusick  Patient Status:  James Coffey - In-pt  Chief Complaint: Intra-abdominal abscess s/p left TG drain placement in IR 03/17/22  Subjective: Patient in bed on his phone. He denies any significant pain or discomfort.   Allergies: Hydrocodone  Medications: Prior to Admission medications   Medication Sig Start Date End Date Taking? Authorizing Provider  acetaminophen (TYLENOL) 500 MG tablet Take 2 tablets (1,000 mg total) by mouth every 8 (eight) hours as needed for mild pain or fever. Patient taking differently: Take 1,000 mg by mouth daily as needed for mild pain or fever. 12/19/20  Yes Barkley Boards R, PA-C  ibuprofen (ADVIL) 200 MG tablet Take 400 mg by mouth daily as needed for moderate pain.   Yes [provider]  docusate sodium (COLACE) 100 MG capsule Take 1 capsule (100 mg total) by mouth 2 (two) times daily. Patient not taking: Reported on 01/21/2022 08/31/21   Dwan Bolt, MD  loperamide (IMODIUM) 2 MG capsule Take 2 tablets by mouth at onset of diarrhea, then 1 tab every 2hrs until 12hr without a BM. May take 2 tab every 4hrs at bedtime. If diarrhea recurs repeat. Patient not taking: Reported on 09/29/2021 05/07/21   [name], MD  LORazepam (ATIVAN) 0.5 MG tablet Take 1 tablet by mouth every 6 hours as needed for anxiety. Patient not taking: Reported on 01/21/2022 11/05/21   [name], MD  oxyCODONE (OXY IR/ROXICODONE) 5 MG immediate release tablet Take 1 tablet (5 mg total) by mouth every 6 (six) hours as needed for moderate pain, severe pain or breakthrough pain. Patient not taking: Reported on 03/16/2022 02/08/22   Coralie Keens, MD  prochlorperazine (COMPAZINE) 10 MG tablet Take 1 tablet (10 mg total) by mouth every 6 (six) hours as needed for nausea or vomiting. Patient not taking: Reported on 01/21/2022 11/05/21   [name], MD     Vital Signs: BP (!) 81/56 (BP  Location: Left Arm)   Pulse 86   Temp 98.8 F (37.1 C) (Oral)   Resp 17   Ht '5\' 9"'$  (1.753 m)   Wt 175 lb (79.4 kg)   SpO2 95%   BMI 25.84 kg/m   Physical Exam Constitutional:      General: He is not in acute distress. Pulmonary:     Effort: Pulmonary effort is normal.  Abdominal:     Comments: Left TG drain to gravity. Scant amount of purulent material in bag. Drain easily flushed/aspirated. Dressing is clean/dry/intact. Patient denies pain/tenderness at the drain site.   Skin:    General: Skin is warm and dry.  Neurological:     Mental Status: He is alert and oriented to person, place, and time.     Imaging: CT GUIDED VISCERAL FLUID DRAIN BY PERC CATH  Result Date: 03/17/2022 CLINICAL DATA:  Postoperative presacral and posterior pelvic abscess after AP resection of rectal carcinoma. EXAM: CT GUIDED CATHETER DRAINAGE OF PRESACRAL PERITONEAL ABSCESS ANESTHESIA/SEDATION: Moderate (conscious) sedation was employed during this procedure. A total of Versed 2.0 mg and Fentanyl 50 mcg was administered intravenously by radiology nursing. Moderate Sedation Time: 28 minutes. The patient's level of consciousness and vital signs were monitored continuously by radiology nursing throughout the procedure under my direct supervision. PROCEDURE: The procedure, risks, benefits, and alternatives were explained to the patient. Questions regarding the procedure were encouraged and answered. The patient understands and consents to the procedure. A time out was performed prior  to initiating the procedure. CT was performed through the pelvis in a decubitus position with the right side down and the left side up. The left transgluteal region was prepped with chlorhexidine in a sterile fashion, and a sterile drape was applied covering the operative field. A sterile gown and sterile gloves were used for the procedure. Local anesthesia was provided with 1% Lidocaine. An 18 gauge trocar needle was advanced under CT  guidance from a left transgluteal approach to the level of a lower presacral pelvic abscess. After confirming needle tip position, a guidewire was advanced into the collection. The tract was dilated over the wire and a 10 French percutaneous drainage catheter advanced over the wire. Drain position was confirmed by CT. A fluid sample was aspirated from the drain and sent for culture analysis. The drainage catheter was attached to suction bulb drainage and secured at the skin with a Prolene retention suture and adhesive retention device. RADIATION DOSE REDUCTION: This exam was performed according to the departmental dose-optimization program which includes automated exposure control, adjustment of the mA and/or kV according to patient size and/or use of iterative reconstruction technique. COMPLICATIONS: None FINDINGS: After placement of the drainage catheter from a left transgluteal approach into the elongated presacral collection containing fluid and air, there was return of both air and milky purulent fluid. A fluid sample was sent for culture analysis. IMPRESSION: CT-guided percutaneous catheter drainage of presacral abscess. A 10 French percutaneous drainage catheter was placed via a left transgluteal approach. A sample of purulent fluid was sent for culture analysis. Electronically Signed   By: Aletta Edouard M.D.   On: 03/17/2022 17:13   CT ABDOMEN PELVIS W CONTRAST  Result Date: 03/16/2022 CLINICAL DATA:  Acute abdominal pain, nonfocal. 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 Status post APR and conversion of loop colostomy to end ostomy 02/04/2022. * Tracking Code: BO * EXAM: CT ABDOMEN AND PELVIS WITH CONTRAST TECHNIQUE: Multidetector CT imaging of the abdomen and pelvis was performed using the standard protocol following bolus administration of intravenous contrast. RADIATION DOSE REDUCTION: This exam was performed according to the departmental dose-optimization program which includes automated exposure  control, adjustment of the mA and/or kV according to patient size and/or use of iterative reconstruction technique. CONTRAST:  120m OMNIPAQUE IOHEXOL 300 MG/ML  SOLN COMPARISON:  12/29/2021 CT abdomen. 04/21/2021 CT chest, abdomen and pelvis. 07/13/2021 MRI abdomen and pelvis. FINDINGS: Lower chest: Solid 1.8 cm lingular pulmonary nodule (series 6/image 24), increased from 1.4 cm on 12/29/2021 CT. Coronary atherosclerosis. Tip of superior approach central venous catheter seen at the cavoatrial junction. Hepatobiliary: Ablation defect in right liver measures 3.8 x 2.3 cm (series 2/image 28), slightly decreased from 4.0 x 2.8 cm on 12/29/2021 CT. Stable postsurgical changes from partial anterior left hepatectomy with adjacent surgical clips. No new liver lesions. Normal gallbladder with no radiopaque cholelithiasis. No biliary ductal dilatation. Pancreas: Normal, with no mass or duct dilation. Spleen: Normal size. No mass. Adrenals/Urinary Tract: Normal adrenals. Simple 2.0 cm medial upper right renal cyst, for which no follow-up imaging is recommended. Otherwise normal kidneys, with no hydronephrosis. Layering 7 mm stone in bladder. Chronic mild diffuse bladder wall thickening. Stomach/Bowel: Normal non-distended stomach. Status post interval abdominal perineal resection and end colostomy. 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 Ill-defined 10.5 x 2.9 x 4.4 cm abscess in the ventral left peritoneal cavity with mildly thickened enhancing wall and internal gas (series 2/image 69). No significant small bowel dilatation or wall thickening. Appendix not discretely  visualized. No wall thickening, diverticulosis or significant pericolonic fat stranding in the remnant large-bowel. Presacral space 5.9 x 2.6 x 12.9 cm collection with air-fluid level and surrounding ill-defined soft tissue and fat stranding (series 2/image 88). Vascular/Lymphatic: Atherosclerotic nonaneurysmal abdominal aorta. Patent portal,  splenic, hepatic and renal veins. No pathologically enlarged lymph nodes in the abdomen or pelvis. Reproductive: Normal size prostate.  No ascites. Other: No pneumoperitoneum, ascites or focal fluid collection. Musculoskeletal: No aggressive appearing focal osseous lesions. Moderate degenerative disc disease at L5-S1. IMPRESSION: 1. Status post interval abdominal perineal resection and end colostomy. 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 No evidence of bowel obstruction. 2. Ill-defined 10.5 x 2.9 x 4.4 cm abscess in the ventral left peritoneal cavity near the end ostomy site. 3. Presacral space 5.9 x 2.6 x 12.9 cm collection with air-fluid level and surrounding ill-defined soft tissue and fat stranding, favor expected post-APR surgical change, although infected collection difficult to exclude in this location. 4. Solid 1.8 cm lingular pulmonary nodule, increased from 1.4 cm on 12/29/2021 CT, compatible with enlarging pulmonary metastasis. 5. Ablation defect in the right liver measures 3.8 x 2.3 cm, slightly decreased from 4.0 x 2.8 cm on 12/29/2021 CT. No new liver lesions. 6. Layering 7 mm stone in the bladder. No hydronephrosis. Chronic mild diffuse bladder wall thickening, nonspecific. 7. Coronary atherosclerosis. 8.  Aortic Atherosclerosis (ICD10-I70.0). Electronically Signed   By: Ilona Sorrel M.D.   On: 03/16/2022 10:31    Labs:  CBC: Recent Labs    02/06/22 0510 03/16/22 0849 03/16/22 0902 03/17/22 0438 03/18/22 0108  WBC 8.8 12.8*  --  9.5 9.7  HGB 10.3* 9.6* 10.9* 7.9* 7.5*  HCT 31.2* 30.7* 32.0* 26.8* 24.4*  PLT 169 408*  --  316 312    COAGS: Recent Labs    10/02/21 0907 03/17/22 0438  INR 1.0 1.4*    BMP: Recent Labs    02/06/22 0510 03/16/22 0849 03/16/22 0902 03/17/22 0438 03/18/22 0108  NA 139 133* 134* 135 135  K 3.8 3.6 3.7 4.1 3.7  CL 107 100 100 104 104  CO2 25 23  --  22 24  GLUCOSE 111* 206* 198* 89 129*  BUN '9 12 10 8 8  '$ CALCIUM 8.6* 9.1   --  8.6* 8.2*  CREATININE 0.70 0.86 0.70 0.65 0.70  GFRNONAA >60 >60  --  >60 >60    LIVER FUNCTION TESTS: Recent Labs    01/20/22 0821 03/16/22 0849 03/17/22 0438 03/18/22 0108  BILITOT 0.6 1.2 1.3* 1.0  AST 16 25 14* 24  ALT '17 31 21 23  '$ ALKPHOS 92 153* 121 156*  PROT 7.2 8.1 6.5 6.6  ALBUMIN 3.7 3.0* 2.4* 2.3*    Assessment and Plan:  Intra-abdominal abscess s/p left TG drain placement in IR 03/17/22  Scant amount of purulent material in bag. Patient denies pain/discomfort. He is afebrile and without leukocytosis.   Drain Location: left TG Size: Fr size: 10 Fr Date of placement: 03/17/22 Currently to: Drain collection device: gravity 24 hour output:  Output by Drain (mL) 03/17/22 0700 - 03/17/22 1459 03/17/22 1500 - 03/17/22 2259 03/17/22 2300 - 03/18/22 0659 03/18/22 0700 - 03/18/22 1459 03/18/22 1500 - 03/18/22 2259 03/18/22 2300 - 03/19/22 0659 03/19/22 0700 - 03/19/22 1052  Closed System Drain 1 Right Abdomen Bulb (JP)   0  0 0   Closed System Drain 1 Left Buttock Bulb (JP) 10 Fr. 34 40 80 40 0 0 0  Interval imaging/drain manipulation:  Drain switched from bulb to bag 03/18/22  Current examination: Flushes/aspirates easily.  Insertion site unremarkable. Suture and stat lock in place. Dressed appropriately.   Plan: Continue TID flushes with 5 cc NS. Record output Q shift. Dressing changes QD or PRN if soiled.  Call IR APP or on call IR MD if difficulty flushing or sudden change in drain output.  Repeat imaging/possible drain injection once output < 10 mL/QD (excluding flush material). Consideration for drain removal if output is < 10 mL/QD (excluding flush material), pending discussion with the providing surgical service.  Discharge planning: Please contact IR APP or on call IR MD prior to patient d/c to ensure appropriate follow up plans are in place. Typically patient will follow up with IR clinic 10-14 days post d/c for repeat imaging/possible drain  injection. IR scheduler will contact patient with date/time of appointment. Patient will need to flush drain QD with 5 cc NS, record output QD, dressing changes every 2-3 days or earlier if soiled.   IR will continue to follow - please call with questions or concerns.  Electronically Signed: Soyla Dryer, AGACNP-BC [phone] 03/19/2022, 10:49 AM   I spent a total of 15 Minutes at the the patient's bedside AND on the patient's hospital floor or unit, greater than 50% of which was counseling/coordinating care for intra-abdominal abscess drain.

## 2022-03-19 NOTE — Consult Note (Signed)
[place] Nurse ostomy follow up Stoma type/location: LUQ colostomy with ulceration. Stomal assessment/size: 1 and 1/2 inches round, dry, tacky, lumen in center. Minimally budded Peristomal assessment: with ulcerations, friable. Dr. Marlou Starks in to assess with Evette Cristal, C[facility] PA-C and PA student.. Treatment options for stomal/peristomal skin: Calcium alginate dressing, skin barrier ring, pouch aw usual. Note: Patient prefers clamp to Lock and Roll closure mechanism. Skin barrier is extended for security. Output: brown stool, very small amount. Ostomy pouching: 2pc. 2 and 3/4 inch pouching system with skin barrier ring and ca+ alginate dressing. Education provided: None Enrolled patient in [employer] Discharge program: No. Patient is established with a supplier.  [place] nursing team will follow, and will remain available to this patient, the nursing and medical teams.    Thank you for inviting [place] to participate in this patient's Plan of Care.  Maudie Flakes, MSN, RN, CNS, [place], Serita Grammes, [employer], [employer] phone:  [phone]

## 2022-03-19 NOTE — Progress Notes (Signed)
  Progress Note   Patient: James Coffey JKD:[id number] DOB: [birth year] DOA: 03/16/2022     3 DOS: the patient was seen and examined on 03/19/2022   Brief hospital course: 51 year old male PMH rectal cancer with metastatic disease to the liver, open partial hepatectomy, laparoscopic loop colostomy presented with worsening abdominal pain.  Imaging showed intra-abdominal abscess, admitted for surgical evaluation. --10/9 admit --10/10 IR consulted, s/p CT guided drain placement posterior pelvic/presacral abscess  Assessment and Plan: Intra-abdominal abscess --CT of the abdomen pelvis on admission showed an ill-defined 10.5 x 2.9 x 4.4 cm abscess interventional left peritoneal cavity, and also presacral space 5.9 x 2.6 x 12.9 cm collection favored to represent postsurgical changes but difficult to exclude infected fluid. --General surgery consulted.  IR consulted and patient improved s/p CT-guided drainage.   --continue Zosyn. Monitor abscess cultures postprocedure, GPC, GPR, E coli thus far   Rectal cancer metastasized to the liver --followed by Dr. Irene Limbo, status post liver surgery as well as tumor removal, chemotherapy as well as radiation therapy. --Imaging does show concern for a lung metastatic lesion, with future plans for SBRT of isolated lung lesion per oncology.   Tobacco use --nicotine patch    Hyperglycemia --A1c 5.5.  Stress reaction in the setting of an active infection   Anemia of acute/chronic illness --stable   Hyponatremia --resolved  Bladder stone --follow-up as an outpatient    Subjective:  Feels ok, not much pain  Physical Exam: Vitals:   03/19/22 0155 03/19/22 0624 03/19/22 0915 03/19/22 1324  BP: (!) 85/54 (!) 91/53 (!) 81/56 (!) 87/59  Pulse: 92 84 86 87  Resp: '16 18 17 17  '$ Temp: 100 F (37.8 C) 99.8 F (37.7 C) 98.8 F (37.1 C) 98.4 F (36.9 C)  TempSrc: Oral Oral Oral Oral  SpO2: 99% 96% 95% 95%  Weight:      Height:       Physical Exam Vitals  reviewed.  Constitutional:      General: He is not in acute distress.    Appearance: He is not ill-appearing or toxic-appearing.  Cardiovascular:     Rate and Rhythm: Normal rate and regular rhythm.     Heart sounds: No murmur heard. Pulmonary:     Effort: Pulmonary effort is normal. No respiratory distress.     Breath sounds: No wheezing, rhonchi or rales.  Abdominal:     Palpations: Abdomen is soft.  Neurological:     Mental Status: He is alert.  Psychiatric:        Mood and Affect: Mood normal.        Behavior: Behavior normal.     Data Reviewed:  UOP 2330 CMP noted Hgb stable 7.5  Family Communication: none  Disposition: Status is: Inpatient Remains inpatient appropriate because: intraabdominal abscess  Planned Discharge Destination: Home    Time spent: 25 minutes  Author: Murray Hodgkins, MD 03/19/2022 5:19 PM  For on call review [email or web address].

## 2022-03-19 NOTE — Progress Notes (Signed)
Mobility Specialist Cancellation/Refusal Note:    03/19/22 1156  Mobility  Activity Refused mobility     Reason for Cancellation/Refusal: Pt declined mobility at this time. Pt not feeling well.  Will check back as schedule permits.      [facility]

## 2022-03-19 NOTE — Progress Notes (Addendum)
Subjective: CC: Feeling better today.  Less pain inferior to his stoma.  No other abdominal pain.  Tolerating regular diet without nausea or vomiting.  Having air from ostomy but still no liquid/stool output from ostomy in last 48 hours.  Does not feel distended.  Mobilizing. Voiding  Objective: Vital signs in last 24 hours: Temp:  [98.8 F (37.1 C)-100.1 F (37.8 C)] 98.8 F (37.1 C) (10/12 0915) Pulse Rate:  [84-92] 86 (10/12 0915) Resp:  [16-18] 17 (10/12 0915) BP: (81-102)/(53-74) 81/56 (10/12 0915) SpO2:  [95 %-99 %] 95 % (10/12 0915) Last BM Date : 03/19/22  Intake/Output from previous day: 10/11 0701 - 10/12 0700 In: 1455.2 [P.O.:900; I.V.:388.3; IV Piggyback:156.9] Out: 2370 [Urine:2330; Drains:40] Intake/Output this shift: Total I/O In: 180 [P.O.:180] Out: 200 [Urine:200]  PE: Gen:  Alert, NAD, pleasant Pulm:  rate and effort normal Abd: Soft, mild distension, ttp across the mid/left abdomen below his stoma without rigidity or guarding - stable to improved from yesterday. +BS. Peristomal hernia is soft. Stoma appaers pink, budded and viable. Ostomy bag with air, no stool. IR drain with gravity bag with thin, yellow, purulent output - 40cc/24 hours GU: Perineal incision has completely broken down. There is thin, yellow/tan foul smelling fluid draining from this area similar in appearence to the IR drain fluid. Ext:  No LE edema  Psych: A&Ox3   Addendum: Came back with WOCN and Dr. Marlou Starks to evaluate peristomal wound. There is a full thickness like wound at 10 - 1 o'clock as noted above. This is not cratered but rather flat to slightly above skin level. The area is very soft. There is no drainage.     Lab Results:  Recent Labs    03/17/22 0438 03/18/22 0108  WBC 9.5 9.7  HGB 7.9* 7.5*  HCT 26.8* 24.4*  PLT 316 312   BMET Recent Labs    03/17/22 0438 03/18/22 0108  NA 135 135  K 4.1 3.7  CL 104 104  CO2 22 24  GLUCOSE 89 129*  BUN 8 8   CREATININE 0.65 0.70  CALCIUM 8.6* 8.2*   PT/INR Recent Labs    03/17/22 0438  LABPROT 17.1*  INR 1.4*   CMP     Component Value Date/Time   NA 135 03/18/2022 0108   K 3.7 03/18/2022 0108   CL 104 03/18/2022 0108   CO2 24 03/18/2022 0108   GLUCOSE 129 (H) 03/18/2022 0108   BUN 8 03/18/2022 0108   CREATININE 0.70 03/18/2022 0108   CREATININE 0.76 01/20/2022 0821   CALCIUM 8.2 (L) 03/18/2022 0108   PROT 6.6 03/18/2022 0108   ALBUMIN 2.3 (L) 03/18/2022 0108   AST 24 03/18/2022 0108   AST 16 01/20/2022 0821   ALT 23 03/18/2022 0108   ALT 17 01/20/2022 0821   ALKPHOS 156 (H) 03/18/2022 0108   BILITOT 1.0 03/18/2022 0108   BILITOT 0.6 01/20/2022 0821   GFRNONAA >60 03/18/2022 0108   GFRNONAA >60 01/20/2022 0821   Lipase     Component Value Date/Time   LIPASE 25 03/16/2022 0849    Studies/Results: CT GUIDED VISCERAL FLUID DRAIN BY PERC CATH  Result Date: 03/17/2022 CLINICAL DATA:  Postoperative presacral and posterior pelvic abscess after AP resection of rectal carcinoma. EXAM: CT GUIDED CATHETER DRAINAGE OF PRESACRAL PERITONEAL ABSCESS ANESTHESIA/SEDATION: Moderate (conscious) sedation was employed during this procedure. A total of Versed 2.0 mg and Fentanyl 50 mcg was administered intravenously by radiology nursing. Moderate Sedation Time:  28 minutes. The patient's level of consciousness and vital signs were monitored continuously by radiology nursing throughout the procedure under my direct supervision. PROCEDURE: The procedure, risks, benefits, and alternatives were explained to the patient. Questions regarding the procedure were encouraged and answered. The patient understands and consents to the procedure. A time out was performed prior to initiating the procedure. CT was performed through the pelvis in a decubitus position with the right side down and the left side up. The left transgluteal region was prepped with chlorhexidine in a sterile fashion, and a sterile drape  was applied covering the operative field. A sterile gown and sterile gloves were used for the procedure. Local anesthesia was provided with 1% Lidocaine. An 18 gauge trocar needle was advanced under CT guidance from a left transgluteal approach to the level of a lower presacral pelvic abscess. After confirming needle tip position, a guidewire was advanced into the collection. The tract was dilated over the wire and a 10 French percutaneous drainage catheter advanced over the wire. Drain position was confirmed by CT. A fluid sample was aspirated from the drain and sent for culture analysis. The drainage catheter was attached to suction bulb drainage and secured at the skin with a Prolene retention suture and adhesive retention device. RADIATION DOSE REDUCTION: This exam was performed according to the departmental dose-optimization program which includes automated exposure control, adjustment of the mA and/or kV according to patient size and/or use of iterative reconstruction technique. COMPLICATIONS: None FINDINGS: After placement of the drainage catheter from a left transgluteal approach into the elongated presacral collection containing fluid and air, there was return of both air and milky purulent fluid. A fluid sample was sent for culture analysis. IMPRESSION: CT-guided percutaneous catheter drainage of presacral abscess. A 10 French percutaneous drainage catheter was placed via a left transgluteal approach. A sample of purulent fluid was sent for culture analysis. Electronically Signed   By: Aletta Edouard M.D.   On: 03/17/2022 17:13    Anti-infectives: Anti-infectives (From admission, onward)    Start     Dose/Rate Route Frequency Ordered Stop   03/16/22 1900  piperacillin-tazobactam (ZOSYN) IVPB 3.375 g  Status:  Discontinued        3.375 g 100 mL/hr over 30 Minutes Intravenous Every 8 hours 03/16/22 1407 03/16/22 1408   03/16/22 1800  piperacillin-tazobactam (ZOSYN) IVPB 3.375 g        3.375  g 12.5 mL/hr over 240 Minutes Intravenous Every 8 hours 03/16/22 1409     03/16/22 1100  piperacillin-tazobactam (ZOSYN) IVPB 3.375 g        3.375 g 100 mL/hr over 30 Minutes Intravenous  Once 03/16/22 1050 03/16/22 1136        Assessment/Plan This is a 51 y.o. male with a hx of stage 4 rectal cancer (liver met s/p partial L hepatectomy by Dr. [name] 08/2021 and percutaneous ablation of a right liver tumor in early May) who is s/p completed abdominal perineal resection, robotic approach and February 04, 2022 by Dr. [name].  - Patient with breakdown of perineal wound. CT w/ presacral space 5.9 x 2.6 x 12.9 cm collection with air-fluid level and surrounding ill-defined soft tissue and fat stranding. S/p IR drain 10/10. Cx's pending. Gram stain with gram + cocci and rods. Discussed with colorectal. Back perineal wound BID and keep IR drain to gravity bag. Likely not a candidate for flap given how far out he is and continued tobacco use. Cont abx  - Patient also with ill-defined  10.5 x 2.9 x 4.4 cm abscess in the ventral left peritoneal cavity near the end ostomy site. Reviewed with attending. Would NOT recommend IR drainage of this area. Cont abx and monitor. WBC normalized. - Patient with parastomal hernia. This was repaired primarily on 8/30. Appears to have recurred. Cont to monitor. Tolerating diet and having air from ostomy but no stool over last 48 hours. Abdomen is soft and overall does not appear significantly distended w/ good bs. Will add bowel regimen. If he develops any distension, n/v will make npo and get xray. - Wound near stoma at ~10-1 o'clock - seen with Dr. Marlou Starks - we also reviewed CT images. No indication for any surgery at this time. Will plan to keep covered. WOCN is applying calcium alginate over wound. Continue to follow.  - Notes difficulty getting in with ostomy clinic. I have spoken with their clinic and they will reach out to him for follow up.  - We will follow.     FEN - Reg.  IVF per trh VTE - SCDs, okay for chem ppx from our standpoint ID - Zosyn 10/9 >> T max 100.1, WBC 9.7 (10/11)   Solid 1.8 cm lingular pulmonary nodule, increased from 1.4 cm on 12/29/2021 CT, compatible with enlarging pulmonary metastasis - will need follow up with Dr. Irene Limbo. Appears CT chest was ordered as outpatient to further eval this.    LOS: 3 days    Jillyn Ledger , [facility] 03/19/2022, 11:25 AM Please see Amion for pager number during day hours 7:00am-4:30pm

## 2022-03-19 NOTE — Hospital Course (Addendum)
51 year old male PMH rectal cancer with metastatic disease to the liver, open partial hepatectomy, laparoscopic loop colostomy presented with worsening abdominal pain.  Imaging showed intra-abdominal abscess, admitted for surgical evaluation. --10/9 admit --10/10 IR consulted, s/p CT guided drain placement posterior pelvic/presacral abscess --10/13-14 stable, await clearance by surgery for discharge

## 2022-03-20 ENCOUNTER — Inpatient Hospital Stay (HOSPITAL_COMMUNITY): Payer: Medicaid Other

## 2022-03-20 DIAGNOSIS — C20 Malignant neoplasm of rectum: Secondary | ICD-10-CM | POA: Diagnosis not present

## 2022-03-20 DIAGNOSIS — K651 Peritoneal abscess: Secondary | ICD-10-CM | POA: Diagnosis not present

## 2022-03-20 DIAGNOSIS — C787 Secondary malignant neoplasm of liver and intrahepatic bile duct: Secondary | ICD-10-CM | POA: Diagnosis not present

## 2022-03-20 MED ORDER — ZOLPIDEM TARTRATE 5 MG PO TABS
5.0000 mg | ORAL_TABLET | Freq: Every evening | ORAL | Status: DC | PRN
  Administered 2022-03-20: 5 mg via ORAL
  Filled 2022-03-20: qty 1

## 2022-03-20 MED ORDER — SODIUM CHLORIDE 0.9 % IV SOLN
3.0000 g | Freq: Four times a day (QID) | INTRAVENOUS | Status: DC
  Administered 2022-03-20 – 2022-03-22 (×8): 3 g via INTRAVENOUS
  Filled 2022-03-20 (×9): qty 8

## 2022-03-20 NOTE — Progress Notes (Addendum)
Referring Physician(s): Toth,P  Supervising Physician: Aletta Edouard  Patient Status:  [facility] - In-pt  Chief Complaint:  Lower abdominal pain  Subjective: Pt without new c/o; continues to lay rt side down   Allergies: Hydrocodone  Medications: Prior to Admission medications   Medication Sig Start Date End Date Taking? Authorizing Provider  acetaminophen (TYLENOL) 500 MG tablet Take 2 tablets (1,000 mg total) by mouth every 8 (eight) hours as needed for mild pain or fever. Patient taking differently: Take 1,000 mg by mouth daily as needed for mild pain or fever. 12/19/20  Yes Barkley Boards R, PA-C  ibuprofen (ADVIL) 200 MG tablet Take 400 mg by mouth daily as needed for moderate pain.   Yes [provider]  docusate sodium (COLACE) 100 MG capsule Take 1 capsule (100 mg total) by mouth 2 (two) times daily. Patient not taking: Reported on 01/21/2022 08/31/21   Dwan Bolt, MD  loperamide (IMODIUM) 2 MG capsule Take 2 tablets by mouth at onset of diarrhea, then 1 tab every 2hrs until 12hr without a BM. May take 2 tab every 4hrs at bedtime. If diarrhea recurs repeat. Patient not taking: Reported on 09/29/2021 05/07/21   [name], MD  LORazepam (ATIVAN) 0.5 MG tablet Take 1 tablet by mouth every 6 hours as needed for anxiety. Patient not taking: Reported on 01/21/2022 11/05/21   [name], MD  oxyCODONE (OXY IR/ROXICODONE) 5 MG immediate release tablet Take 1 tablet (5 mg total) by mouth every 6 (six) hours as needed for moderate pain, severe pain or breakthrough pain. Patient not taking: Reported on 03/16/2022 02/08/22   Coralie Keens, MD  prochlorperazine (COMPAZINE) 10 MG tablet Take 1 tablet (10 mg total) by mouth every 6 (six) hours as needed for nausea or vomiting. Patient not taking: Reported on 01/21/2022 11/05/21   [name], MD     Vital Signs: BP 92/61 (BP Location: Left Arm)   Pulse 75   Temp 98.7 F (37.1 C) (Oral)   Resp  18   Ht _0  (1.753 m)   Wt 175 lb (79.4 kg)   SpO2 98%   BMI 25.84 kg/m   Physical Exam awake, alert; pelvic drain in place, dressing dry, site with minimal tenderness, drain flushed without difficulty, return of air/turbid yellow fluid  Imaging: CT GUIDED VISCERAL FLUID DRAIN BY PERC CATH  Result Date: 03/17/2022 CLINICAL DATA:  Postoperative presacral and posterior pelvic abscess after AP resection of rectal carcinoma. EXAM: CT GUIDED CATHETER DRAINAGE OF PRESACRAL PERITONEAL ABSCESS ANESTHESIA/SEDATION: Moderate (conscious) sedation was employed during this procedure. A total of Versed 2.0 mg and Fentanyl 50 mcg was administered intravenously by radiology nursing. Moderate Sedation Time: 28 minutes. The patient's level of consciousness and vital signs were monitored continuously by radiology nursing throughout the procedure under my direct supervision. PROCEDURE: The procedure, risks, benefits, and alternatives were explained to the patient. Questions regarding the procedure were encouraged and answered. The patient understands and consents to the procedure. A time out was performed prior to initiating the procedure. CT was performed through the pelvis in a decubitus position with the right side down and the left side up. The left transgluteal region was prepped with chlorhexidine in a sterile fashion, and a sterile drape was applied covering the operative field. A sterile gown and sterile gloves were used for the procedure. Local anesthesia was provided with 1% Lidocaine. An 18 gauge trocar needle was advanced under CT guidance from a left transgluteal approach  to the level of a lower presacral pelvic abscess. After confirming needle tip position, a guidewire was advanced into the collection. The tract was dilated over the wire and a 10 French percutaneous drainage catheter advanced over the wire. Drain position was confirmed by CT. A fluid sample was aspirated from the drain and sent for culture  analysis. The drainage catheter was attached to suction bulb drainage and secured at the skin with a Prolene retention suture and adhesive retention device. RADIATION DOSE REDUCTION: This exam was performed according to the departmental dose-optimization program which includes automated exposure control, adjustment of the mA and/or kV according to patient size and/or use of iterative reconstruction technique. COMPLICATIONS: None FINDINGS: After placement of the drainage catheter from a left transgluteal approach into the elongated presacral collection containing fluid and air, there was return of both air and milky purulent fluid. A fluid sample was sent for culture analysis. IMPRESSION: CT-guided percutaneous catheter drainage of presacral abscess. A 10 French percutaneous drainage catheter was placed via a left transgluteal approach. A sample of purulent fluid was sent for culture analysis. Electronically Signed   By: Aletta Edouard M.D.   On: 03/17/2022 17:13    Labs:  CBC: Recent Labs    02/06/22 0510 03/16/22 0849 03/16/22 0902 03/17/22 0438 03/18/22 0108  WBC 8.8 12.8*  --  9.5 9.7  HGB 10.3* 9.6* 10.9* 7.9* 7.5*  HCT 31.2* 30.7* 32.0* 26.8* 24.4*  PLT 169 408*  --  316 312    COAGS: Recent Labs    10/02/21 0907 03/17/22 0438  INR 1.0 1.4*    BMP: Recent Labs    02/06/22 0510 03/16/22 0849 03/16/22 0902 03/17/22 0438 03/18/22 0108  NA 139 133* 134* 135 135  K 3.8 3.6 3.7 4.1 3.7  CL 107 100 100 104 104  CO2 25 23  --  22 24  GLUCOSE 111* 206* 198* 89 129*  BUN _0 CALCIUM 8.6* 9.1  --  8.6* 8.2*  CREATININE 0.70 0.86 0.70 0.65 0.70  GFRNONAA >60 >60  --  >60 >60    LIVER FUNCTION TESTS: Recent Labs    01/20/22 0821 03/16/22 0849 03/17/22 0438 03/18/22 0108  BILITOT 0.6 1.2 1.3* 1.0  AST 16 25 14* 24  ALT _1 ALKPHOS 92 153* 121 156*  PROT 7.2 8.1 6.5 6.6  ALBUMIN 3.7 3.0* 2.4* 2.3*    Assessment and Plan: 51 y.o. male with a hx of  stage 4 rectal cancer (liver met s/p partial L hepatectomy by Dr. [name] 08/2021 and percutaneous ablation of a right liver tumor in early May) who is s/p completed abdominal perineal resection, robotic approach and February 04, 2022 by Dr. [name]; now with presacral air/fluid collection, s/p drain placement 10/10 (10 fr to bag now); afebrile, BP soft,no new labs; drain fl cx- e coli;  cont with current tx, monitor labs; obtain f/u CT next week or sooner if WBC increases; other plans as per [facility]/TR[facility]   Electronically Signed: D. Rowe Robert, PA-C 03/20/2022, 1:51 PM   I spent a total of 15 Minutes at the the patient's bedside AND on the patient's hospital floor or unit, greater than 50% of which was counseling/coordinating care for presacral abscess drain   Patient ID: James Coffey, male   DOB: 10/19/1970, 51 y.o.   MRN: [id number]

## 2022-03-20 NOTE — Progress Notes (Signed)
Subjective/Chief Complaint: No complaints   Objective: Vital signs in last 24 hours: Temp:  [98.4 F (36.9 C)-100 F (37.8 C)] 99.4 F (37.4 C) (10/13 0600) Pulse Rate:  [79-87] 79 (10/13 0600) Resp:  [17-18] 18 (10/13 0600) BP: (81-88)/(52-59) 81/56 (10/13 0600) SpO2:  [95 %-96 %] 96 % (10/13 0600) Last BM Date : 03/19/22  Intake/Output from previous day: 10/12 0701 - 10/13 0700 In: 1040.8 [P.O.:960; IV Piggyback:75.8] Out: 2150 [Urine:2150] Intake/Output this shift: No intake/output data recorded.  General appearance: alert and cooperative Resp: clear to auscultation bilaterally Cardio: regular rate and rhythm GI: soft, minimal tenderness. Ostomy pink, just air in bag. Good bs  Lab Results:  Recent Labs    03/18/22 0108  WBC 9.7  HGB 7.5*  HCT 24.4*  PLT 312   BMET Recent Labs    03/18/22 0108  NA 135  K 3.7  CL 104  CO2 24  GLUCOSE 129*  BUN 8  CREATININE 0.70  CALCIUM 8.2*   PT/INR No results for input(s): "LABPROT", "INR" in the last 72 hours. ABG No results for input(s): "PHART", "HCO3" in the last 72 hours.  Invalid input(s): "PCO2", "PO2"  Studies/Results: No results found.  Anti-infectives: Anti-infectives (From admission, onward)    Start     Dose/Rate Route Frequency Ordered Stop   03/16/22 1900  piperacillin-tazobactam (ZOSYN) IVPB 3.375 g  Status:  Discontinued        3.375 g 100 mL/hr over 30 Minutes Intravenous Every 8 hours 03/16/22 1407 03/16/22 1408   03/16/22 1800  piperacillin-tazobactam (ZOSYN) IVPB 3.375 g        3.375 g 12.5 mL/hr over 240 Minutes Intravenous Every 8 hours 03/16/22 1409     03/16/22 1100  piperacillin-tazobactam (ZOSYN) IVPB 3.375 g        3.375 g 100 mL/hr over 30 Minutes Intravenous  Once 03/16/22 1050 03/16/22 1136       Assessment/Plan: s/p * No surgery found * Advance diet This is a 51 y.o. male with a hx of stage 4 rectal cancer (liver met s/p partial L hepatectomy by Dr. [name] 08/2021  and percutaneous ablation of a right liver tumor in early May) who is s/p completed abdominal perineal resection, robotic approach and February 04, 2022 by Dr. [name].  - Patient with breakdown of perineal wound. CT w/ presacral space 5.9 x 2.6 x 12.9 cm collection with air-fluid level and surrounding ill-defined soft tissue and fat stranding. S/p IR drain 10/10. Cx's pending. Gram stain with gram + cocci and rods. Discussed with colorectal. Back perineal wound BID and keep IR drain to gravity bag. Likely not a candidate for flap given how far out he is and continued tobacco use. Cont abx  - Patient also with ill-defined 10.5 x 2.9 x 4.4 cm abscess in the ventral left peritoneal cavity near the end ostomy site. Reviewed with attending. Would NOT recommend IR drainage of this area. Cont abx and monitor. WBC normalized. - Patient with parastomal hernia. This was repaired primarily on 8/30. Appears to have recurred. Cont to monitor. Tolerating diet and having air from ostomy but no stool over last 48 hours. Abdomen is soft and overall does not appear significantly distended w/ good bs. Will add bowel regimen. If he develops any distension, n/v will make npo and get xray. - Wound near stoma at ~10-1 o'clock - seen with Dr. Marlou Starks - we also reviewed CT images. No indication for any surgery at this time. Will plan to keep  covered. WOCN is applying calcium alginate over wound. Continue to follow.  - Notes difficulty getting in with ostomy clinic. I have spoken with their clinic and they will reach out to him for follow up.  - We will follow.     FEN - Reg. IVF per trh VTE - SCDs, okay for chem ppx from our standpoint ID - Zosyn 10/9 >> afebrile, WBC 9.7 (10/11)   Solid 1.8 cm lingular pulmonary nodule, increased from 1.4 cm on 12/29/2021 CT, compatible with enlarging pulmonary metastasis - will need follow up with Dr. Irene Limbo. Appears CT chest was ordered as outpatient to further eval this.   LOS: 4 days    Autumn Messing III 03/20/2022

## 2022-03-20 NOTE — Plan of Care (Signed)

## 2022-03-20 NOTE — TOC Progression Note (Addendum)
Transition of Care [facility]) - Progression Note    Patient Details  Name: James Coffey MRN: [id number] Date of Birth: [birth year]  [facility] of [facility]) CM/SW Contact  Leeroy Cha, RN Phone Number: 03/20/2022, 10:58 AM  Clinical Narrative:    hhRN request send to [place] via text. At 1058. [facility] unable to take.  Sent to [facility] unable to take medicaid plans. Sent to [facility] answer Sent to [facility] staff [facility]  [facility]  [facility] no staff to do care. Md made aware that I have called 6 hhc agencies and am unable to find an rn hhc for this patient.  Expected Discharge Plan and Services                                                 Social Determinants of Health (SDOH) Interventions    Readmission Risk Interventions   No data to display

## 2022-03-20 NOTE — Progress Notes (Signed)
  Progress Note   Patient: James Coffey YYQ:[id number] DOB: 1971/05/23 DOA: 03/16/2022     4 DOS: the patient was seen and examined on 03/20/2022   Brief hospital course: 51 year old male PMH rectal cancer with metastatic disease to the liver, open partial hepatectomy, laparoscopic loop colostomy presented with worsening abdominal pain.  Imaging showed intra-abdominal abscess, admitted for surgical evaluation. --10/9 admit --10/10 IR consulted, s/p CT guided drain placement posterior pelvic/presacral abscess --10/13 stable, await clearance by surgery for discharge  Intra-abdominal abscess --CT of the abdomen pelvis on admission showed an ill-defined 10.5 x 2.9 x 4.4 cm abscess interventional left peritoneal cavity, and also presacral space 5.9 x 2.6 x 12.9 cm collection favored to represent postsurgical changes but difficult to exclude infected fluid. --General surgery managing.  IR consulted and patient improved s/p CT-guided drainage.   --Monitor abscess cultures postprocedure, GPC, GPR, E coli thus far. Change to Unasyn.   Rectal cancer metastasized to the liver --followed by Dr. Irene Limbo, status post liver surgery as well as tumor removal, chemotherapy as well as radiation therapy. --Imaging does show concern for a lung metastatic lesion, with future plans for SBRT of isolated lung lesion per oncology. --Dr. Irene Limbo requested CT w/o contrast for staging purposes   Tobacco use --nicotine patch    Hyperglycemia --A1c 5.5.  Stress reaction in the setting of an active infection   Anemia of acute/chronic illness --stable   Hyponatremia --resolved   Bladder stone --follow-up as an outpatient       Subjective:  Feels ok except poor sleep. Medicine didn't help.  Physical Exam: Vitals:   03/19/22 2127 03/19/22 2128 03/20/22 0600 03/20/22 1329  BP: (!) 81/52  (!) 81/56 92/61  Pulse: 84  79 75  Resp: '18  18 18  '$ Temp: 100 F (37.8 C) 98.9 F (37.2 C) 99.4 F (37.4 C) 98.7 F (37.1  C)  TempSrc: Oral Oral Oral Oral  SpO2: 96%  96% 98%  Weight:      Height:       Physical Exam Vitals reviewed.  Constitutional:      General: He is not in acute distress.    Appearance: He is not ill-appearing or toxic-appearing.  Cardiovascular:     Rate and Rhythm: Normal rate and regular rhythm.     Heart sounds: No murmur heard. Pulmonary:     Effort: Pulmonary effort is normal. No respiratory distress.     Breath sounds: No wheezing, rhonchi or rales.  Neurological:     Mental Status: He is alert.  Psychiatric:        Mood and Affect: Mood normal.        Behavior: Behavior normal.     Data Reviewed:  No new data  Family Communication: none  Disposition: Status is: Inpatient Remains inpatient appropriate because: intraabdominal abscess  Planned Discharge Destination: Home    Time spent: 20 minutes  Author: Murray Hodgkins, MD 03/20/2022 5:50 PM  For on call review [email or web address].

## 2022-03-20 NOTE — Progress Notes (Signed)
Patient refused dressing change

## 2022-03-20 NOTE — Progress Notes (Signed)
Mobility Specialist Cancellation/Refusal Note:   03/20/22 1149  Mobility  Activity Refused mobility     Reason for Cancellation/Refusal: Pt declined mobility at this time. Pt just came back from imaging & is feeling tired. Will check back as schedule permits.   [facility]

## 2022-03-21 ENCOUNTER — Encounter (HOSPITAL_COMMUNITY): Payer: Self-pay | Admitting: Family Medicine

## 2022-03-21 DIAGNOSIS — I77810 Thoracic aortic ectasia: Secondary | ICD-10-CM

## 2022-03-21 DIAGNOSIS — K651 Peritoneal abscess: Secondary | ICD-10-CM | POA: Diagnosis not present

## 2022-03-21 DIAGNOSIS — C787 Secondary malignant neoplasm of liver and intrahepatic bile duct: Secondary | ICD-10-CM | POA: Diagnosis not present

## 2022-03-21 DIAGNOSIS — C20 Malignant neoplasm of rectum: Secondary | ICD-10-CM | POA: Diagnosis not present

## 2022-03-21 HISTORY — DX: Thoracic aortic ectasia: I77.810

## 2022-03-21 LAB — CULTURE, BLOOD (ROUTINE X 2)
Culture: NO GROWTH
Culture: NO GROWTH

## 2022-03-21 MED ORDER — ZOLPIDEM TARTRATE 5 MG PO TABS
10.0000 mg | ORAL_TABLET | Freq: Every evening | ORAL | Status: DC | PRN
  Administered 2022-03-21: 10 mg via ORAL
  Filled 2022-03-21: qty 2

## 2022-03-21 NOTE — Progress Notes (Signed)
Mobility Specialist Cancellation/Refusal Note:    03/21/22 1132  Mobility  Activity Refused mobility    Reason for Cancellation/Refusal: Pt declined mobility at this time. Pt sleeping at this time.  Will check back as schedule permits.       [facility]

## 2022-03-21 NOTE — Progress Notes (Signed)
  Progress Note   Patient: James Coffey ZOX:[id number] DOB: [birth year] DOA: 03/16/2022     5 DOS: the patient was seen and examined on 03/21/2022   Brief hospital course: 51 year old male PMH rectal cancer with metastatic disease to the liver, open partial hepatectomy, laparoscopic loop colostomy presented with worsening abdominal pain.  Imaging showed intra-abdominal abscess, admitted for surgical evaluation. --10/9 admit --10/10 IR consulted, s/p CT guided drain placement posterior pelvic/presacral abscess --10/13-14 stable, await clearance by surgery for discharge  Assessment and Plan: Intra-abdominal abscess --CT of the abdomen pelvis on admission showed an ill-defined 10.5 x 2.9 x 4.4 cm abscess interventional left peritoneal cavity, and also presacral space 5.9 x 2.6 x 12.9 cm collection favored to represent postsurgical changes but difficult to exclude infected fluid. --General surgery managing.  IR consulted and patient improved s/p CT-guided drainage.   --Monitor abscess cultures postprocedure, GPC, GPR, E coli thus far. Continue Unasyn.   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 Irene Limbo, status post liver surgery as well as tumor removal, chemotherapy as well as radiation therapy. --Imaging does show concern for a lung metastatic lesion, with future plans for SBRT of isolated lung lesion per oncology. --Dr. Irene Limbo requested CT w/o contrast for staging purposes. Complete. Will follow-up as an outpatient.   Tobacco use --nicotine patch    Hyperglycemia --A1c 5.5.  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 --Recommend annual imaging followup by CTA or MRA. Aortic aneurysm NOS (ICD10-I71.9)     Subjective:  Feels ok Slept better Eating ok Pain controlled Ambulating    Physical Exam: Vitals:   03/20/22 0600 03/20/22 1329 03/20/22 2132 03/21/22 0550  BP: (!) '81/56 92/61 95/63 '$ (!) 95/58  Pulse: 79 75 86 79  Resp: '18 18 18 16  '$ Temp: 99.4 F (37.4 C) 98.7 F (37.1 C) (!) 100.4 F (38 C) 98.5 F (36.9 C)  TempSrc: Oral Oral Oral Oral  SpO2: 96% 98% 97% 98%  Weight:      Height:       Physical Exam Vitals reviewed.  Constitutional:      General: He is not in acute distress.    Appearance: He is not ill-appearing or toxic-appearing.  Cardiovascular:     Rate and Rhythm: Normal rate and regular rhythm.     Heart sounds: No murmur heard. Pulmonary:     Effort: Pulmonary effort is normal. No respiratory distress.     Breath sounds: No wheezing, rhonchi or rales.  Neurological:     Mental Status: He is alert.  Psychiatric:        Mood and Affect: Mood normal.        Behavior: Behavior normal.     Data Reviewed:  UOP 1950 CMP noted Hgb stable 7.5  Family Communication: none  Disposition: Status is: Inpatient Remains inpatient appropriate because: intraabdominal abscess  Planned Discharge Destination: Home    Time spent: 20 minutes  Author: Murray Hodgkins, MD 03/21/2022 10:59 AM  For on call review [email or web address].

## 2022-03-21 NOTE — Progress Notes (Signed)
Subjective/Chief Complaint: No complaints   Objective: Vital signs in last 24 hours: Temp:  [98.5 F (36.9 C)-100.4 F (38 C)] 98.5 F (36.9 C) (10/14 0550) Pulse Rate:  [75-86] 79 (10/14 0550) Resp:  [16-18] 16 (10/14 0550) BP: (92-95)/(58-63) 95/58 (10/14 0550) SpO2:  [97 %-98 %] 98 % (10/14 0550) Last BM Date : 03/19/22  Intake/Output from previous day: 10/13 0701 - 10/14 0700 In: 086 [P.O.:620; I.V.:5; IV Piggyback:300] Out: 1970 [VHQIO:9629; Drains:20] Intake/Output this shift: No intake/output data recorded.  General appearance: alert and cooperative Resp: clear to auscultation bilaterally Cardio: regular rate and rhythm GI: soft, minimal tenderness. Ostomy pink, just air in bag. Good bs Perineal wound with copious purulent drainage with position change.  Lab Results:  No results for input(s): "WBC", "HGB", "HCT", "PLT" in the last 72 hours.  BMET No results for input(s): "NA", "K", "CL", "CO2", "GLUCOSE", "BUN", "CREATININE", "CALCIUM" in the last 72 hours.  PT/INR No results for input(s): "LABPROT", "INR" in the last 72 hours. ABG No results for input(s): "PHART", "HCO3" in the last 72 hours.  Invalid input(s): "PCO2", "PO2"  Studies/Results: CT CHEST WO CONTRAST  Result Date: 03/20/2022 CLINICAL DATA:  Rectal carcinoma staging EXAM: CT CHEST WITHOUT CONTRAST TECHNIQUE: Multidetector CT imaging of the chest was performed following the standard protocol without IV contrast. RADIATION DOSE REDUCTION: This exam was performed according to the departmental dose-optimization program which includes automated exposure control, adjustment of the mA and/or kV according to patient size and/or use of iterative reconstruction technique. COMPARISON:  Previous studies including CT chest done on 07/15/2021, CT abdomen done on 03/16/2022 FINDINGS: Cardiovascular: Coronary artery calcifications are seen. Ascending thoracic aorta measures 3.7 cm. Right side of aortic arch  measures 3.5 cm. Mediastinum/Nodes: No significant lymphadenopathy is seen in mediastinum. Right IJ chest port is noted with tip of the catheter in superior vena cava. Lungs/Pleura: In image 42 of series 5, there is 1.8 x 1 cm nodule in right upper lobe which measured 0.7 cm on 07/15/2021. In image 101 of series 5, there is 2.2 x 1.5 cm nodule in lingula which was not seen in the previous CT chest and appears slightly more prominent in comparison with the immediate previous CT abdomen. No other discrete nodules are seen in the lung fields. Small patchy infiltrates are seen in right upper lobe and in right lower lobe. Few blebs and bullae are seen in the upper lung fields. Upper Abdomen: There are metallic densities in the left lobe of liver, possibly from previous intervention. There is ill-defined 3.8 cm low-density in the right lobe of liver which has not changed. Musculoskeletal: No acute findings are seen. IMPRESSION: There on noncalcified nodule in right upper lobe and lingula with interval increase in size suggesting pulmonary metastatic disease. Small patchy infiltrates are seen in right upper lobe and right lower lobe suggesting possible scarring or small foci of interstitial pneumonia. There is no significant lymphadenopathy. Coronary artery disease. 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 Recommend annual imaging followup by CTA or MRA. This recommendation follows 2010 ACCF/AHA/AATS/ACR/ASA/SCA/SCAI/SIR/STS/SVM Guidelines for the Diagnosis and Management of Patients with Thoracic Aortic Disease. Circulation.2010; 121: B284-X324. Aortic aneurysm NOS (ICD10-I71.9) Low-density lesion in liver appears stable. Electronically Signed   By: Elmer Picker M.D.   On: 03/20/2022 13:50    Anti-infectives: Anti-infectives (From admission, onward)    Start     Dose/Rate Route Frequency Ordered Stop   03/20/22 1700  Ampicillin-Sulbactam (UNASYN) 3  g in sodium chloride 0.9 % 100 mL  IVPB        3 g 200 mL/hr over 30 Minutes Intravenous Every 6 hours 03/20/22 1336     03/16/22 1900  piperacillin-tazobactam (ZOSYN) IVPB 3.375 g  Status:  Discontinued        3.375 g 100 mL/hr over 30 Minutes Intravenous Every 8 hours 03/16/22 1407 03/16/22 1408   03/16/22 1800  piperacillin-tazobactam (ZOSYN) IVPB 3.375 g  Status:  Discontinued        3.375 g 12.5 mL/hr over 240 Minutes Intravenous Every 8 hours 03/16/22 1409 03/20/22 1336   03/16/22 1100  piperacillin-tazobactam (ZOSYN) IVPB 3.375 g        3.375 g 100 mL/hr over 30 Minutes Intravenous  Once 03/16/22 1050 03/16/22 1136       Assessment/Plan: s/p * No surgery found * Advance diet This is a 51 y.o. male with a hx of stage 4 rectal cancer (liver met s/p partial L hepatectomy by Dr. [name] 08/2021 and percutaneous ablation of a right liver tumor in early May) who is s/p completed abdominal perineal resection, robotic approach and February 04, 2022 by Dr. [name].  - Patient with breakdown of perineal wound. CT w/ presacral space 5.9 x 2.6 x 12.9 cm collection with air-fluid level and surrounding ill-defined soft tissue and fat stranding. S/p IR drain 10/10, but this also seems to be decompressing through his perineal wound. Cx's pending. Gram stain with gram + cocci and rods. Discussed with colorectal. Pack perineal wound BID and keep IR drain to gravity bag. Likely not a candidate for flap given how far out he is and continued tobacco use. Cont abx  - Patient also with ill-defined 10.5 x 2.9 x 4.4 cm abscess in the ventral left peritoneal cavity near the end ostomy site. Reviewed with attending. Would NOT recommend IR drainage of this area. Cont abx and monitor. WBC normalized. - Patient with parastomal hernia. This was repaired primarily on 8/30. Appears to have recurred. Cont to monitor. Tolerating diet and having air from ostomy but no stool over last 48 hours. Abdomen is soft and overall does not appear significantly distended w/  good bs. Will add bowel regimen. If he develops any distension, n/v will make npo and get xray. - Wound near stoma at ~10-1 o'clock . No indication for any surgery at this time. Will plan to keep covered. WOCN is applying calcium alginate over wound. Continue to follow.  - Notes difficulty getting in with ostomy clinic. I have spoken with their clinic and they will reach out to him for follow up.  - We will follow.     FEN - Reg. IVF per trh VTE - SCDs, okay for chem ppx from our standpoint ID - Zosyn 10/9 >> afebrile, WBC 9.7 (10/11)   Solid 1.8 cm lingular pulmonary nodule, increased from 1.4 cm on 12/29/2021 CT, compatible with enlarging pulmonary metastasis - will need follow up with Dr. Irene Limbo. Appears CT chest was ordered as outpatient to further eval this.   LOS: 5 days    James Coffey 03/21/2022

## 2022-03-22 DIAGNOSIS — I77810 Thoracic aortic ectasia: Secondary | ICD-10-CM | POA: Diagnosis not present

## 2022-03-22 DIAGNOSIS — C20 Malignant neoplasm of rectum: Secondary | ICD-10-CM | POA: Diagnosis not present

## 2022-03-22 DIAGNOSIS — K651 Peritoneal abscess: Secondary | ICD-10-CM | POA: Diagnosis not present

## 2022-03-22 DIAGNOSIS — C787 Secondary malignant neoplasm of liver and intrahepatic bile duct: Secondary | ICD-10-CM | POA: Diagnosis not present

## 2022-03-22 LAB — AEROBIC/ANAEROBIC CULTURE W GRAM STAIN (SURGICAL/DEEP WOUND): Special Requests: NORMAL

## 2022-03-22 MED ORDER — OXYCODONE HCL 5 MG PO TABS: 5.0000 mg | ORAL_TABLET | Freq: Four times a day (QID) | ORAL | 0 refills | Status: DC | PRN

## 2022-03-22 MED ORDER — AMOXICILLIN-POT CLAVULANATE 875-125 MG PO TABS: 1.0000 | ORAL_TABLET | Freq: Two times a day (BID) | ORAL | 0 refills | Status: DC

## 2022-03-22 MED ORDER — ZOLPIDEM TARTRATE 10 MG PO TABS: 10.0000 mg | ORAL_TABLET | Freq: Every evening | ORAL | 1 refills | Status: DC | PRN

## 2022-03-22 NOTE — Consult Note (Signed)
[facility] Nurse ostomy follow up Stoma type/location: LUQ colostomy with ulceration Stomal assessment/size: 1 and 3/8 inch round, budded, dry, tacky, lumen in center Peristomal assessment: with ulceration as noted previously, less friable today Treatment options for stomal/peristomal skin: calcium alginate dressing cut to fit over ulcer and two superior lesions. Skin barrier ring placed around stoma Output: soft brown stool Ostomy pouching: 2pc. 2 and 3/4 inch pouching system with barrier extended to superior border. Patient prefers to use a clamp Education provided: None Enrolled patient in [employer] Discharge program: Yes, previously. Patient is established with a provider.  Supplies provided for discharge and use while in house are located in the patient's cupboard over the sink in his room.  [facility] nursing team will follow while in house, and will remain available to this patient, the nursing and medical teams.  He has been referred to the [facility] previously and during this admission.  Thank you for inviting [place] to participate in this patient's Plan of Care.  Maudie Flakes, MSN, RN, CNS, [place], Serita Grammes, [employer], [employer] phone:  [phone]

## 2022-03-22 NOTE — Progress Notes (Signed)
Subjective/Chief Complaint: No complaints   Objective: Vital signs in last 24 hours: Temp:  [98.6 F (37 C)-100.2 F (37.9 C)] 99.1 F (37.3 C) (10/15 0430) Pulse Rate:  [81-87] 81 (10/15 0430) Resp:  [16-19] 19 (10/15 0430) BP: (89-97)/(55-69) 96/69 (10/15 0430) SpO2:  [95 %-98 %] 95 % (10/15 0430) Last BM Date : 03/21/22  Intake/Output from previous day: 10/14 0701 - 10/15 0700 In: 760 [P.O.:560; IV Piggyback:200] Out: 1465 [Urine:1465] Intake/Output this shift: Total I/O In: -  Out: 500 [Urine:500]  General appearance: alert and cooperative Resp: clear to auscultation bilaterally Cardio: regular rate and rhythm GI: soft, minimal tenderness. Ostomy pink, just air in bag. Good bs Perineal wound with purulent drainage with position change.  Scant purulent drainage in IR drain collection bag  Lab Results:  No results for input(s): "WBC", "HGB", "HCT", "PLT" in the last 72 hours.  BMET No results for input(s): "NA", "K", "CL", "CO2", "GLUCOSE", "BUN", "CREATININE", "CALCIUM" in the last 72 hours.  PT/INR No results for input(s): "LABPROT", "INR" in the last 72 hours. ABG No results for input(s): "PHART", "HCO3" in the last 72 hours.  Invalid input(s): "PCO2", "PO2"  Studies/Results: CT CHEST WO CONTRAST  Result Date: 03/20/2022 CLINICAL DATA:  Rectal carcinoma staging EXAM: CT CHEST WITHOUT CONTRAST TECHNIQUE: Multidetector CT imaging of the chest was performed following the standard protocol without IV contrast. RADIATION DOSE REDUCTION: This exam was performed according to the departmental dose-optimization program which includes automated exposure control, adjustment of the mA and/or kV according to patient size and/or use of iterative reconstruction technique. COMPARISON:  Previous studies including CT chest done on 07/15/2021, CT abdomen done on 03/16/2022 FINDINGS: Cardiovascular: Coronary artery calcifications are seen. Ascending thoracic aorta measures 3.7  cm. Right side of aortic arch measures 3.5 cm. Mediastinum/Nodes: No significant lymphadenopathy is seen in mediastinum. Right IJ chest port is noted with tip of the catheter in superior vena cava. Lungs/Pleura: In image 42 of series 5, there is 1.8 x 1 cm nodule in right upper lobe which measured 0.7 cm on 07/15/2021. In image 101 of series 5, there is 2.2 x 1.5 cm nodule in lingula which was not seen in the previous CT chest and appears slightly more prominent in comparison with the immediate previous CT abdomen. No other discrete nodules are seen in the lung fields. Small patchy infiltrates are seen in right upper lobe and in right lower lobe. Few blebs and bullae are seen in the upper lung fields. Upper Abdomen: There are metallic densities in the left lobe of liver, possibly from previous intervention. There is ill-defined 3.8 cm low-density in the right lobe of liver which has not changed. Musculoskeletal: No acute findings are seen. IMPRESSION: There on noncalcified nodule in right upper lobe and lingula with interval increase in size suggesting pulmonary metastatic disease. Small patchy infiltrates are seen in right upper lobe and right lower lobe suggesting possible scarring or small foci of interstitial pneumonia. There is no significant lymphadenopathy. Coronary artery disease. 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 Recommend annual imaging followup by CTA or MRA. This recommendation follows 2010 ACCF/AHA/AATS/ACR/ASA/SCA/SCAI/SIR/STS/SVM Guidelines for the Diagnosis and Management of Patients with Thoracic Aortic Disease. Circulation.2010; 121: W803-O122. Aortic aneurysm NOS (ICD10-I71.9) Low-density lesion in liver appears stable. Electronically Signed   By: Elmer Picker M.D.   On: 03/20/2022 13:50    Anti-infectives: Anti-infectives (From admission, onward)    Start     Dose/Rate Route Frequency  Ordered Stop   03/20/22 1700  Ampicillin-Sulbactam (UNASYN) 3 g in  sodium chloride 0.9 % 100 mL IVPB        3 g 200 mL/hr over 30 Minutes Intravenous Every 6 hours 03/20/22 1336     03/16/22 1900  piperacillin-tazobactam (ZOSYN) IVPB 3.375 g  Status:  Discontinued        3.375 g 100 mL/hr over 30 Minutes Intravenous Every 8 hours 03/16/22 1407 03/16/22 1408   03/16/22 1800  piperacillin-tazobactam (ZOSYN) IVPB 3.375 g  Status:  Discontinued        3.375 g 12.5 mL/hr over 240 Minutes Intravenous Every 8 hours 03/16/22 1409 03/20/22 1336   03/16/22 1100  piperacillin-tazobactam (ZOSYN) IVPB 3.375 g        3.375 g 100 mL/hr over 30 Minutes Intravenous  Once 03/16/22 1050 03/16/22 1136       Assessment/Plan: s/p * No surgery found * Advance diet This is a 51 y.o. male with a hx of stage 4 rectal cancer (liver met s/p partial L hepatectomy by Dr. [name] 08/2021 and percutaneous ablation of a right liver tumor in early May) who is s/p completed abdominal perineal resection, robotic approach and February 04, 2022 by Dr. [name].  - Patient with breakdown of perineal wound. CT w/ presacral space 5.9 x 2.6 x 12.9 cm collection with air-fluid level and surrounding ill-defined soft tissue and fat stranding. S/p IR drain 10/10, but this also seems to be decompressing through his perineal wound. Cx's pending. Gram stain with gram + cocci and rods. Discussed with colorectal. Pack perineal wound BID and keep IR drain to gravity bag. Likely not a candidate for flap given how far out he is and continued tobacco use. Cont abx  - Patient also with ill-defined 10.5 x 2.9 x 4.4 cm abscess in the ventral left peritoneal cavity near the end ostomy site. Reviewed with attending. Would NOT recommend IR drainage of this area. Cont abx and monitor. WBC normalized. - Patient with parastomal hernia. This was repaired primarily on 8/30. Appears to have recurred. Cont to monitor. Tolerating diet and having air from ostomy but no stool over last 48 hours. Abdomen is soft and overall does not  appear significantly distended w/ good bs. Will add bowel regimen. If he develops any distension, n/v will make npo and get xray. - Wound near stoma at ~10-1 o'clock . No indication for any surgery at this time. Will plan to keep covered. WOCN is applying calcium alginate over wound. Continue to follow.  - Notes difficulty getting in with ostomy clinic. I have spoken with their clinic and they will reach out to him for follow up.  -As we are not doing much for him here in the hospital aside from local wound care, if he is comfortable doing local wound care at home okay for discharge on p.o. antibiotics for another week and close follow-up with [facility] clinic and ostomy clinic  FEN - Reg. IVF per trh VTE - SCDs, okay for chem ppx from our standpoint ID - Zosyn 10/9 >> afebrile, WBC 9.7 (10/11)   Solid 1.8 cm lingular pulmonary nodule, increased from 1.4 cm on 12/29/2021 CT, compatible with enlarging pulmonary metastasis - will need follow up with Dr. Irene Limbo. Appears CT chest was ordered as outpatient to further eval this.   LOS: 6 days    James Coffey 03/22/2022

## 2022-03-22 NOTE — TOC Transition Note (Signed)
Transition of Care [facility]) - CM/SW Discharge Note   Patient Details  Name: James Coffey MRN: [id number] Date of Birth: [birth year]  Transition of Care [facility]) CM/SW Contact:  Vassie Moselle, LCSW Phone Number: 03/22/2022, 12:41 PM   Clinical Narrative:    Pt is to return home to self care. Pt has new ostomy and has been educated on care. No home health agency able to accept this pt for RN services. Pt is to follow up outpatient at ostomy clinic. Home ostomy supplies in pt's room to go with pt at discharge per [facility] RN.    Final next level of care: Home/Self Care Barriers to Discharge: No [facility] will accept this patient   Patient Goals and CMS Choice   CMS Medicare.gov Compare Post Acute Care list provided to:: Patient Choice offered to / list presented to : Patient  Discharge Placement                       Discharge Plan and Services                DME Arranged: Ostomy supplies DME Agency: Other - Comment ([facility], [facility] RN)                  Social Determinants of Health (SDOH) Interventions     Readmission Risk Interventions    03/22/2022   12:38 PM  Readmission Risk Prevention Plan  Transportation Screening Complete  PCP or Specialist Appt within 5-7 Days Complete  Home Care Screening Complete  Medication Review (RN CM) Complete

## 2022-03-22 NOTE — Plan of Care (Signed)
  Problem: Clinical Measurements: Goal: Ability to maintain clinical measurements within normal limits will improve Outcome: Progressing   Problem: Coping: Goal: Level of anxiety will decrease Outcome: Progressing   Problem: Pain Managment: Goal: General experience of comfort will improve Outcome: Progressing   

## 2022-03-22 NOTE — Progress Notes (Signed)
Refused dressing changes on perineal wound as well as drain site. Said he wanted it at [facility].

## 2022-03-22 NOTE — Discharge Summary (Signed)
Physician Discharge Summary   Patient: James Coffey MRN: [id number] DOB: Aug 27, 1970  Admit date:     03/16/2022  Discharge date: 03/22/22  Discharge Physician: Murray Hodgkins   PCP: Patient, No Pcp Per   Recommendations at discharge:   Intra-abdominal abscess, peroneal wound, ostomy --Monitor abscess cultures postprocedure, GPC, GPR, E coli thus far.  --close follow-up with [facility] clinic and ostomy clinic --Patient should follow up with IR clinic 10-14 days post d/c for repeat imaging/possible drain injection. IR scheduler should contact patient with date/time of appointment.     Rectal cancer metastasized to the liver, lung --Dr. Irene Limbo requested CT w/o contrast for staging purposes. Completed. He will follow-up as an outpatient.    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 --Recommend annual imaging followup by CTA or MRA. Aortic aneurysm NOS (ICD10-I71.9)  Discharge Diagnoses: Principal Problem:   Intra-abdominal abscess ([place]) Active Problems:   Tobacco abuse   Rectal cancer metastasized to liver (HCC)   Hyponatremia   Mild protein malnutrition (HCC)   Hyperglycemia   Anemia   Aortic ectasia, thoracic (HCC)  Resolved Problems:   * No resolved hospital problems. *  Hospital Course: 51 year old male PMH rectal cancer with metastatic disease to the liver, open partial hepatectomy, laparoscopic loop colostomy presented with worsening abdominal pain.  Imaging showed intra-abdominal abscess, admitted for surgical evaluation. --10/9 admit --10/10 IR consulted, s/p CT guided drain placement posterior pelvic/presacral abscess --10/13-15 stable, now cleared 10/15 for discharge by general surgery  Intra-abdominal abscess, peroneal wound, ostomy --CT of the abdomen pelvis on admission showed an ill-defined 10.5 x 2.9 x 4.4 cm abscess interventional left peritoneal cavity, and also presacral space 5.9 x 2.6 x 12.9 cm  collection favored to represent postsurgical changes but difficult to exclude infected fluid. --General surgery managing.  IR consulted and patient improved s/p CT-guided drainage.   --Monitor abscess cultures postprocedure, GPC, GPR, E coli thus far. Discharge on 7 days (per general surgery) of Augmentin --close follow-up with [facility] clinic and ostomy clinic --Patient will follow up with IR clinic 10-14 days post d/c for repeat imaging/possible drain injection. IR scheduler will contact patient with date/time of appointment. Patient will need to flush drain QD with 5 cc NS, record output QD, dressing changes every 2-3 days or earlier if soiled.  --Treatment options for stomal/peristomal skin: calcium alginate dressing cut to fit over ulcer and two superior lesions. Skin barrier ring placed around stoma Ostomy pouching: 2pc. 2 and 3/4 inch pouching system with barrier extended to superior border. Patient prefers to use a clamp   Supplies provided for discharge and use while in house are located in the patient's cupboard over the sink in his room.   He has been referred to the [facility] previously and during this admission.   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 Irene Limbo, status post liver surgery as well as tumor removal, chemotherapy as well as radiation therapy. --Imaging does show concern for a lung metastatic lesion, with future plans for SBRT of isolated lung lesion per oncology. --Dr. Irene Limbo requested CT w/o contrast for staging purposes. Completed. He will follow-up as an outpatient.   Tobacco use --nicotine patch    Hyperglycemia --A1c 5.5.  Stress reaction in the setting of an active infection   Anemia of acute/chronic illness --stable   Hyponatremia --resolved   Bladder stone --follow-up as an outpatient   Ectasia of right side  of the aortic arch measuring 3.5 cm. --Recommend annual imaging followup by CTA or MRA. Aortic aneurysm NOS  (ICD10-I71.9)  Insomnia --Ambien   Pain control - [place] Controlled Substance Reporting System database was reviewed.  Consultants:  General surgery IR Procedures performed:   CT Guided Drainage of posterior pelvic/presacral abscess  Disposition: Home Diet recommendation:  Regular diet DISCHARGE MEDICATION: Allergies as of 03/22/2022       Reactions   Hydrocodone Itching        Medication List     STOP taking these medications    docusate sodium 100 MG capsule Commonly known as: COLACE   loperamide 2 MG capsule Commonly known as: IMODIUM   LORazepam 0.5 MG tablet Commonly known as: Ativan       TAKE these medications    acetaminophen 500 MG tablet Commonly known as: TYLENOL Take 2 tablets (1,000 mg total) by mouth every 8 (eight) hours as needed for mild pain or fever. What changed: when to take this   amoxicillin-clavulanate 875-125 MG tablet Commonly known as: AUGMENTIN Take 1 tablet by mouth 2 (two) times daily.   ibuprofen 200 MG tablet Commonly known as: ADVIL Take 400 mg by mouth daily as needed for moderate pain.   oxyCODONE 5 MG immediate release tablet Commonly known as: Oxy IR/ROXICODONE Take 1 tablet (5 mg total) by mouth every 6 (six) hours as needed for moderate pain or severe pain. What changed: reasons to take this   prochlorperazine 10 MG tablet Commonly known as: COMPAZINE Take 1 tablet (10 mg total) by mouth every 6 (six) hours as needed for nausea or vomiting.   zolpidem 10 MG tablet Commonly known as: AMBIEN Take 1 tablet (10 mg total) by mouth at bedtime as needed for sleep.               Discharge Care Instructions  (From admission, onward)           Start     Ordered   03/22/22 0000  Discharge wound care:       Comments: Perineal wound - Cleanse with NS, pat gently dry. Very gently fill defects with saline moistened roll gauze, top with dry gauze, ABD pads and secure with mesh underwear. Change as  needed for soiling, otherwise twice daily.  Treatment options for stomal/peristomal skin: calcium alginate dressing cut to fit over ulcer and two superior lesions. Skin barrier ring placed around stoma  Flush drain daily with 5 mL of normal saline, record output daily, dressing changes every 2-3 days or earlier if soiled.    2pc. 2 and 3/4 inch pouching system with barrier extended to superior border. Patient prefers to use a clamp  Enrolled patient in [facility] Discharge program: Yes, previously. Patient is established with a provider.   Supplies provided for discharge and use while in house are located in the patient's cupboard over the sink in his room.   03/22/22 1110            Follow-up Information     [facility]. Call in 1 day(s).   Why: call Monday 10/16 to make an appointment Contact information: [address] [place] [phone]        [facility]. Schedule an appointment as soon as possible for a visit in 1 week(s).   Specialty: General Surgery Contact information: [address] [id number] [place] [phone]  [facility] Follow up.   Why: Will call you with an appointment to follow-up drain care Contact information: [place] Pain controlled Ambulating   Discharge Exam: Danley Danker Weights   03/16/22 0824  Weight: 79.4 kg   Physical Exam Vitals reviewed.  Constitutional:      General: He is not in acute distress.    Appearance: He is not ill-appearing or toxic-appearing.  Cardiovascular:     Rate and Rhythm: Normal rate and regular rhythm.     Heart sounds: No murmur heard. Pulmonary:     Effort: Pulmonary effort is normal. No respiratory distress.     Breath sounds: No wheezing, rhonchi or rales.   Abdominal:     Palpations: Abdomen is soft.  Neurological:     Mental Status: He is alert.  Psychiatric:        Mood and Affect: Mood normal.        Behavior: Behavior normal.      Condition at discharge: good  The results of significant diagnostics from this hospitalization (including imaging, microbiology, ancillary and laboratory) are listed below for reference.   Imaging Studies: CT CHEST WO CONTRAST  Result Date: 03/20/2022 CLINICAL DATA:  Rectal carcinoma staging EXAM: CT CHEST WITHOUT CONTRAST TECHNIQUE: Multidetector CT imaging of the chest was performed following the standard protocol without IV contrast. RADIATION DOSE REDUCTION: This exam was performed according to the departmental dose-optimization program which includes automated exposure control, adjustment of the mA and/or kV according to patient size and/or use of iterative reconstruction technique. COMPARISON:  Previous studies including CT chest done on 07/15/2021, CT abdomen done on 03/16/2022 FINDINGS: Cardiovascular: Coronary artery calcifications are seen. Ascending thoracic aorta measures 3.7 cm. Right side of aortic arch measures 3.5 cm. Mediastinum/Nodes: No significant lymphadenopathy is seen in mediastinum. Right IJ chest port is noted with tip of the catheter in superior vena cava. Lungs/Pleura: In image 42 of series 5, there is 1.8 x 1 cm nodule in right upper lobe which measured 0.7 cm on 07/15/2021. In image 101 of series 5, there is 2.2 x 1.5 cm nodule in lingula which was not seen in the previous CT chest and appears slightly more prominent in comparison with the immediate previous CT abdomen. No other discrete nodules are seen in the lung fields. Small patchy infiltrates are seen in right upper lobe and in right lower lobe. Few blebs and bullae are seen in the upper lung fields. Upper Abdomen: There are metallic densities in the left lobe of liver, possibly from previous intervention. There is ill-defined 3.8 cm  low-density in the right lobe of liver which has not changed. Musculoskeletal: No acute findings are seen. IMPRESSION: There on noncalcified nodule in right upper lobe and lingula with interval increase in size suggesting pulmonary metastatic disease. Small patchy infiltrates are seen in right upper lobe and right lower lobe suggesting possible scarring or small foci of interstitial pneumonia. There is no significant lymphadenopathy. Coronary artery disease. 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 Recommend annual imaging followup by CTA or MRA. This recommendation follows 2010 ACCF/AHA/AATS/ACR/ASA/SCA/SCAI/SIR/STS/SVM Guidelines for the Diagnosis and Management of Patients with Thoracic Aortic Disease. Circulation.2010; 121: T419-Q222. Aortic aneurysm NOS (ICD10-I71.9) Low-density lesion in liver appears stable. Electronically Signed   By: Elmer Picker M.D.   On: 03/20/2022 13:50  CT GUIDED VISCERAL FLUID DRAIN BY PERC CATH  Result Date: 03/17/2022 CLINICAL DATA:  Postoperative presacral and posterior pelvic abscess after AP resection of rectal carcinoma. EXAM: CT GUIDED CATHETER DRAINAGE OF PRESACRAL PERITONEAL ABSCESS ANESTHESIA/SEDATION: Moderate (conscious) sedation was employed during this procedure. A total of Versed 2.0 mg and Fentanyl 50 mcg was administered intravenously by radiology nursing. Moderate Sedation Time: 28 minutes. The patient's level of consciousness and vital signs were monitored continuously by radiology nursing throughout the procedure under my direct supervision. PROCEDURE: The procedure, risks, benefits, and alternatives were explained to the patient. Questions regarding the procedure were encouraged and answered. The patient understands and consents to the procedure. A time out was performed prior to initiating the procedure. CT was performed through the pelvis in a decubitus position with the right side down and the left side up. The left  transgluteal region was prepped with chlorhexidine in a sterile fashion, and a sterile drape was applied covering the operative field. A sterile gown and sterile gloves were used for the procedure. Local anesthesia was provided with 1% Lidocaine. An 18 gauge trocar needle was advanced under CT guidance from a left transgluteal approach to the level of a lower presacral pelvic abscess. After confirming needle tip position, a guidewire was advanced into the collection. The tract was dilated over the wire and a 10 French percutaneous drainage catheter advanced over the wire. Drain position was confirmed by CT. A fluid sample was aspirated from the drain and sent for culture analysis. The drainage catheter was attached to suction bulb drainage and secured at the skin with a Prolene retention suture and adhesive retention device. RADIATION DOSE REDUCTION: This exam was performed according to the departmental dose-optimization program which includes automated exposure control, adjustment of the mA and/or kV according to patient size and/or use of iterative reconstruction technique. COMPLICATIONS: None FINDINGS: After placement of the drainage catheter from a left transgluteal approach into the elongated presacral collection containing fluid and air, there was return of both air and milky purulent fluid. A fluid sample was sent for culture analysis. IMPRESSION: CT-guided percutaneous catheter drainage of presacral abscess. A 10 French percutaneous drainage catheter was placed via a left transgluteal approach. A sample of purulent fluid was sent for culture analysis. Electronically Signed   By: Aletta Edouard M.D.   On: 03/17/2022 17:13   CT ABDOMEN PELVIS W CONTRAST  Result Date: 03/16/2022 CLINICAL DATA:  Acute abdominal pain, nonfocal. 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 Status post APR and conversion of loop colostomy to end ostomy 02/04/2022. * Tracking Code: BO * EXAM: CT ABDOMEN AND PELVIS WITH CONTRAST  TECHNIQUE: Multidetector CT imaging of the abdomen and pelvis was performed using the standard protocol following bolus administration of intravenous contrast. RADIATION DOSE REDUCTION: This exam was performed according to the departmental dose-optimization program which includes automated exposure control, adjustment of the mA and/or kV according to patient size and/or use of iterative reconstruction technique. CONTRAST:  1107m OMNIPAQUE IOHEXOL 300 MG/ML  SOLN COMPARISON:  12/29/2021 CT abdomen. 04/21/2021 CT chest, abdomen and pelvis. 07/13/2021 MRI abdomen and pelvis. FINDINGS: Lower chest: Solid 1.8 cm lingular pulmonary nodule (series 6/image 24), increased from 1.4 cm on 12/29/2021 CT. Coronary atherosclerosis. Tip of superior approach central venous catheter seen at the cavoatrial junction. Hepatobiliary: Ablation defect in right liver measures 3.8 x 2.3 cm (series 2/image 28), slightly decreased from 4.0 x 2.8 cm on 12/29/2021 CT. Stable postsurgical changes from partial anterior left hepatectomy with adjacent surgical clips.  No new liver lesions. Normal gallbladder with no radiopaque cholelithiasis. No biliary ductal dilatation. Pancreas: Normal, with no mass or duct dilation. Spleen: Normal size. No mass. Adrenals/Urinary Tract: Normal adrenals. Simple 2.0 cm medial upper right renal cyst, for which no follow-up imaging is recommended. Otherwise normal kidneys, with no hydronephrosis. Layering 7 mm stone in bladder. Chronic mild diffuse bladder wall thickening. Stomach/Bowel: Normal non-distended stomach. Status post interval abdominal perineal resection and end colostomy. 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 Ill-defined 10.5 x 2.9 x 4.4 cm abscess in the ventral left peritoneal cavity with mildly thickened enhancing wall and internal gas (series 2/image 69). No significant small bowel dilatation or wall thickening. Appendix not discretely visualized. No wall thickening, diverticulosis or  significant pericolonic fat stranding in the remnant large-bowel. Presacral space 5.9 x 2.6 x 12.9 cm collection with air-fluid level and surrounding ill-defined soft tissue and fat stranding (series 2/image 88). Vascular/Lymphatic: Atherosclerotic nonaneurysmal abdominal aorta. Patent portal, splenic, hepatic and renal veins. No pathologically enlarged lymph nodes in the abdomen or pelvis. Reproductive: Normal size prostate.  No ascites. Other: No pneumoperitoneum, ascites or focal fluid collection. Musculoskeletal: No aggressive appearing focal osseous lesions. Moderate degenerative disc disease at L5-S1. IMPRESSION: 1. Status post interval abdominal perineal resection and end colostomy. 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 No evidence of bowel obstruction. 2. Ill-defined 10.5 x 2.9 x 4.4 cm abscess in the ventral left peritoneal cavity near the end ostomy site. 3. Presacral space 5.9 x 2.6 x 12.9 cm collection with air-fluid level and surrounding ill-defined soft tissue and fat stranding, favor expected post-APR surgical change, although infected collection difficult to exclude in this location. 4. Solid 1.8 cm lingular pulmonary nodule, increased from 1.4 cm on 12/29/2021 CT, compatible with enlarging pulmonary metastasis. 5. Ablation defect in the right liver measures 3.8 x 2.3 cm, slightly decreased from 4.0 x 2.8 cm on 12/29/2021 CT. No new liver lesions. 6. Layering 7 mm stone in the bladder. No hydronephrosis. Chronic mild diffuse bladder wall thickening, nonspecific. 7. Coronary atherosclerosis. 8.  Aortic Atherosclerosis (ICD10-I70.0). Electronically Signed   By: Ilona Sorrel M.D.   On: 03/16/2022 10:31    Microbiology: Results for orders placed or performed during the hospital encounter of 03/16/22  Blood culture (routine x 2)     Status: None   Collection Time: 03/16/22 11:10 AM   Specimen: BLOOD  Result Value Ref Range Status   Specimen Description   Final    BLOOD BLOOD LEFT  ARM Performed at [facility] [address], [place], [place] [address]    Special Requests   Final    Blood Culture results may not be optimal due to an excessive volume of blood received in culture bottles BOTTLES DRAWN AEROBIC AND ANAEROBIC Performed at [facility], [place] [address], [place], [place] [address]    Culture   Final    NO GROWTH 5 DAYS Performed at [facility], [place] [address] [place], [place] [address]    Report Status 03/21/2022 FINAL  Final  Blood culture (routine x 2)     Status: None   Collection Time: 03/16/22 11:15 AM   Specimen: BLOOD  Result Value Ref Range Status   Specimen Description   Final    BLOOD BLOOD RIGHT ARM Performed at [facility] [address], [place], [place] [address]    Special Requests   Final    Blood Culture results may not  be optimal due to an excessive volume of blood received in culture bottles BOTTLES DRAWN AEROBIC AND ANAEROBIC Performed at [facility] [address], [place], [place] [address]    Culture   Final    NO GROWTH 5 DAYS Performed at [facility], [place] [address], [place], [place] [address]    Report Status 03/21/2022 FINAL  Final  Aerobic/Anaerobic Culture w Gram Stain (surgical/deep wound)     Status: None (Preliminary result)   Collection Time: 03/17/22 11:40 AM   Specimen: Abscess  Result Value Ref Range Status   Specimen Description   Final    ABSCESS PELVIS Performed at [facility] [address], [place], [place] [address]    Special Requests   Final    Normal Performed at [facility], [place] [address] [place], [place] [address]    Gram Stain   Final    ABUNDANT WBC PRESENT, PREDOMINANTLY PMN RARE GRAM POSITIVE COCCI RARE GRAM POSITIVE RODS Performed at [facility], [place] [address] [place], [place] [address]    Culture   Final    ABUNDANT ESCHERICHIA COLI NO  ANAEROBES ISOLATED; CULTURE IN PROGRESS FOR 5 DAYS    Report Status PENDING  Incomplete   Organism ID, Bacteria ESCHERICHIA COLI  Final      Susceptibility   Escherichia coli - MIC*    AMPICILLIN <=2 SENSITIVE Sensitive     CEFAZOLIN <=4 SENSITIVE Sensitive     CEFEPIME <=0.12 SENSITIVE Sensitive     CEFTAZIDIME <=1 SENSITIVE Sensitive     CEFTRIAXONE <=0.25 SENSITIVE Sensitive     CIPROFLOXACIN <=0.25 SENSITIVE Sensitive     GENTAMICIN <=1 SENSITIVE Sensitive     IMIPENEM <=0.25 SENSITIVE Sensitive     TRIMETH/SULFA <=20 SENSITIVE Sensitive     AMPICILLIN/SULBACTAM <=2 SENSITIVE Sensitive     PIP/TAZO <=4 SENSITIVE Sensitive     * ABUNDANT ESCHERICHIA COLI    Labs: CBC: Recent Labs  Lab 03/16/22 0849 03/16/22 0902 03/17/22 0438 03/18/22 0108  WBC 12.8*  --  9.5 9.7  HGB 9.6* 10.9* 7.9* 7.5*  HCT 30.7* 32.0* 26.8* 24.4*  MCV 89.8  --  95.4 90.4  PLT 408*  --  316 130   Basic Metabolic Panel: Recent Labs  Lab 03/16/22 0849 03/16/22 0902 03/17/22 0438 03/18/22 0108  NA 133* 134* 135 135  K 3.6 3.7 4.1 3.7  CL 100 100 104 104  CO2 23  --  22 24  GLUCOSE 206* 198* 89 129*  BUN '12 10 8 8  '$ CREATININE 0.86 0.70 0.65 0.70  CALCIUM 9.1  --  8.6* 8.2*  MG 2.2  --   --  2.3  PHOS 3.3  --   --   --    Liver Function Tests: Recent Labs  Lab 03/16/22 0849 03/17/22 0438 03/18/22 0108  AST 25 14* 24  ALT '31 21 23  '$ ALKPHOS 153* 121 156*  BILITOT 1.2 1.3* 1.0  PROT 8.1 6.5 6.6  ALBUMIN 3.0* 2.4* 2.3*   CBG: No results for input(s): "GLUCAP" in the last 168 hours.  Discharge time spent: less than 30 minutes.  Signed: Murray Hodgkins, MD [facility] 03/22/2022

## 2022-04-02 ENCOUNTER — Other Ambulatory Visit: Payer: Self-pay

## 2022-04-07 ENCOUNTER — Inpatient Hospital Stay: Payer: Medicaid Other | Admitting: Hematology

## 2022-04-07 ENCOUNTER — Inpatient Hospital Stay: Payer: Medicaid Other

## 2022-04-15 ENCOUNTER — Inpatient Hospital Stay: Payer: Medicaid Other | Attending: Hematology

## 2022-04-15 ENCOUNTER — Inpatient Hospital Stay: Payer: Medicaid Other | Admitting: Hematology

## 2022-04-16 ENCOUNTER — Telehealth: Payer: Self-pay | Admitting: Hematology

## 2022-04-16 NOTE — Telephone Encounter (Signed)
Called patient's daughter per 11/8 in basket. Informed of rescheduled appointment.

## 2022-04-17 ENCOUNTER — Other Ambulatory Visit: Payer: Self-pay

## 2022-04-20 ENCOUNTER — Ambulatory Visit (HOSPITAL_COMMUNITY)
Admission: RE | Admit: 2022-04-20 | Discharge: 2022-04-20 | Disposition: A | Discharge status: Home / Self Care | Payer: Medicaid Other | Source: Ambulatory Visit | Attending: Nurse Practitioner | Admitting: Nurse Practitioner

## 2022-04-20 DIAGNOSIS — T8189XD Other complications of procedures, not elsewhere classified, subsequent encounter: Secondary | ICD-10-CM

## 2022-04-20 DIAGNOSIS — T8132XA Disruption of internal operation (surgical) wound, not elsewhere classified, initial encounter: Secondary | ICD-10-CM

## 2022-04-20 DIAGNOSIS — Z433 Encounter for attention to colostomy: Secondary | ICD-10-CM | POA: Insufficient documentation

## 2022-04-20 DIAGNOSIS — K9400 Colostomy complication, unspecified: Secondary | ICD-10-CM | POA: Diagnosis not present

## 2022-04-20 NOTE — Progress Notes (Signed)
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  Parastomal hernia ROS  Review of Systems  Gastrointestinal:        RUQ colostomy Perineal nonhealing wound  Skin:  Positive for rash and wound.       LLQ drain in place  Erythema to surrounding skin  All other systems reviewed and are negative.  Vital signs:  BP (!) 143/91 (BP Location: Right Arm)   Pulse 91   Temp 98.1 F (36.7 C) (Oral)   Resp 18   SpO2 99%  Exam:  Physical Exam Vitals reviewed.  Constitutional:      Appearance: Normal appearance.  Abdominal:     Hernia: A hernia is present.     Comments: RUQ colostomy  Skin:    General: Skin is warm and dry.     Findings: Erythema and lesion present.  Neurological:     Mental Status: He is alert and oriented to person, place, and time.  Psychiatric:        Mood and Affect: Mood normal.        Behavior: Behavior normal.     Stoma type/location:  RUQ  Stomal assessment/size:  1 1/2" pink and moist Peristomal assessment:  Lesion has resolved, erythema remains. Parastomal bulging/hernia present.  I apply an ostomy belt for additional support today.  Has LLQ drain in place, no output lately, he indicates. I do not see followup appointments scheduled and I have encouraged him to call surgeon's office to inquire about drain.  He has a soiled dressing around the drain that he states has been there for many days.  I encourage him to change this daily and provide additional supplies  Treatment options for stomal/peristomal skin: Barrier ring and 2 piece pouch system.  No issues with pouching, he states.  His daughter assists him at home.  Perirectal wound remains.  I assess and place new dressing here today.  Some brown effluent noted.   Output: soft brown stool Ostomy pouching: 2pc. With barrier ring  Add belt today due to hernia Education provided:  HE is unclear  about supplies, states that his daughter is handling this.  I am going to enroll him with [facility] and list daughter as contact.  His stool is thick today and he states this comes and goes.  I encourage him to begin Miralax once daily due to abdominal discomfort and dry, hard stools.     Impression/dx  colostomy Discussion  See above Wound care Drain care Ostomy care Plan  See back as needed.     Visit time: 45 minutes.   Domenic Moras FNP-BC

## 2022-04-20 NOTE — Discharge Instructions (Addendum)
Start taking Miralax 1 cap full daily for constipation Let me know if you need help with supplies. (Recommend [facility])  Daily wound care Change dressing around drain every 1-2 days.

## 2022-04-22 ENCOUNTER — Encounter (HOSPITAL_COMMUNITY): Payer: Self-pay | Admitting: Nurse Practitioner

## 2022-04-22 DIAGNOSIS — K9400 Colostomy complication, unspecified: Secondary | ICD-10-CM | POA: Insufficient documentation

## 2022-04-22 DIAGNOSIS — T8132XA Disruption of internal operation (surgical) wound, not elsewhere classified, initial encounter: Secondary | ICD-10-CM | POA: Insufficient documentation

## 2022-04-22 DIAGNOSIS — T8189XA Other complications of procedures, not elsewhere classified, initial encounter: Secondary | ICD-10-CM | POA: Insufficient documentation

## 2022-04-24 ENCOUNTER — Other Ambulatory Visit: Payer: Self-pay | Admitting: General Surgery

## 2022-04-24 ENCOUNTER — Other Ambulatory Visit (HOSPITAL_COMMUNITY): Payer: Self-pay | Admitting: General Surgery

## 2022-04-24 ENCOUNTER — Telehealth: Payer: Self-pay | Admitting: Radiation Oncology

## 2022-04-24 DIAGNOSIS — K651 Peritoneal abscess: Secondary | ICD-10-CM

## 2022-04-24 NOTE — Telephone Encounter (Signed)
Called patient to schedule a follow up visit w. Dr. Lisbeth Renshaw. No answer, LVM for a return call.

## 2022-04-27 ENCOUNTER — Telehealth: Payer: Self-pay | Admitting: Radiation Oncology

## 2022-04-27 NOTE — Telephone Encounter (Signed)
Called patient to get a follow up visit scheduled w. Dr. Lisbeth Renshaw. No answer, LVM for a return call.

## 2022-04-28 ENCOUNTER — Ambulatory Visit (HOSPITAL_COMMUNITY)
Admission: RE | Admit: 2022-04-28 | Discharge: 2022-04-28 | Disposition: A | Discharge status: Home / Self Care | Payer: Medicaid Other | Source: Ambulatory Visit | Attending: General Surgery | Admitting: General Surgery

## 2022-04-28 ENCOUNTER — Other Ambulatory Visit (HOSPITAL_COMMUNITY): Payer: Self-pay | Admitting: Nurse Practitioner

## 2022-04-28 ENCOUNTER — Telehealth: Payer: Self-pay | Admitting: Radiation Oncology

## 2022-04-28 DIAGNOSIS — K651 Peritoneal abscess: Secondary | ICD-10-CM | POA: Insufficient documentation

## 2022-04-28 DIAGNOSIS — Z433 Encounter for attention to colostomy: Secondary | ICD-10-CM

## 2022-04-28 HISTORY — PX: IR SINUS/FIST TUBE CHK-NON GI: IMG673

## 2022-04-28 MED ORDER — IOHEXOL 350 MG/ML SOLN
75.0000 mL | Freq: Once | INTRAVENOUS | Status: AC | PRN
  Administered 2022-04-28: 75 mL via INTRAVENOUS

## 2022-04-28 MED ORDER — IOHEXOL 300 MG/ML  SOLN
50.0000 mL | Freq: Once | INTRAMUSCULAR | Status: AC | PRN
  Administered 2022-04-28: 10 mL

## 2022-04-28 NOTE — Procedures (Signed)
Pre procedural Dx: Rectal cancer with pre-sacral drain.  Post procedural Dx: Same  History of metastatic rectal cancer, post APR, found to have a air and fluid collection within the pelvic cul-de-sac postoperative abdominal CT performed 03/16/2022 for which the patient underwent a left-sided trans gluteal approach percutaneous drainage catheter.   Patient reports little to no output from the drainage catheter for the past 1-2 weeks and presents today for drainage catheter evaluation and management.  Preceding abdominal CT demonstrates appropriate positioning of the drainage catheter with end coiled and locked within the residual very minimally reduced residual air and fluid containing collection within the presacral space.  Drain injection confirmed appropriate positioning and functionality of the chronic left trans gluteal approach percutaneous drainage catheter with passage of contrast through the anus and without definitive evidence of fistulous connection to the intestines or the urinary bladder.   Per request from Dr. [name], the drainage catheter was subsequently removed at the bedside without incident.   EBL: Trace Complications: None immediate  Ronny Bacon, MD Pager #: [phone]

## 2022-04-28 NOTE — Progress Notes (Signed)
Thoracic Location of Tumor / Histology: Lung Cancer  CT AP 04/28/2022:  CT Chest 03/20/2022: 1.8 x 1 cm nodule in right upper lobe which measured 0.7 cm on 07/15/2021.   There is 2.2 x 1.5 cm nodule in lingula which was not seen in the previous CT chest and appears slightly more prominent in comparison with the immediate previous CT abdomen. No other discrete nodules are seen in the lung fields.   CT Chest 2/72023: Solitary right upper lobe pulmonary nodule appears unchanged from the most recent CT 3 months ago, although appears new from study of 7 months ago and could reflect a stable solitary metastasis.  Attention on follow-up recommended.  No new nodules, adenopathy or other evidence of thoracic metastatic disease.    Tobacco/Marijuana/Snuff/ETOH use: Current smoker, has cut back.  Past/Anticipated interventions by cardiothoracic surgery, if any:   Past/Anticipated interventions by medical oncology, if any:  Dr. Irene Limbo 06/02/2022   Signs/Symptoms Weight changes, if any: He notes a couple pound weight loss since surgery. Respiratory complaints, if any: Denies SOB. Hemoptysis, if any: Denies cough. Pain issues, if any:  He reports continued pain in surgical field.  SAFETY ISSUES: Prior radiation? Rectum 5/22-6/29/2023 50.4 Gy in 28 fractions with 1.8 Gy per fraction. RUL 7/18-7/25/2023 stereotactic body radiation treatment. 54 Gy In 3 fractions at 18 G per fraction.  Pacemaker/ICD?   Possible current pregnancy? N/a  Is the patient on methotrexate? No  Current Complaints / other details:

## 2022-04-28 NOTE — Telephone Encounter (Signed)
Called patient to schedule a FUN visit with Dr. Lisbeth Renshaw. No answer, LVM for a return call. Sent unable to contact letter 11/21.

## 2022-04-29 ENCOUNTER — Ambulatory Visit
Admission: RE | Admit: 2022-04-29 | Discharge: 2022-04-29 | Disposition: A | Discharge status: Home / Self Care | Payer: Medicaid Other | Source: Ambulatory Visit | Attending: Radiation Oncology | Admitting: Radiation Oncology

## 2022-04-29 ENCOUNTER — Encounter: Payer: Self-pay | Admitting: Radiation Oncology

## 2022-04-29 VITALS — BP 114/77 | HR 88 | Temp 97.9°F | Resp 18 | Ht 69.0 in | Wt 170.8 lb

## 2022-04-29 DIAGNOSIS — R634 Abnormal weight loss: Secondary | ICD-10-CM | POA: Insufficient documentation

## 2022-04-29 DIAGNOSIS — C7800 Secondary malignant neoplasm of unspecified lung: Secondary | ICD-10-CM | POA: Diagnosis not present

## 2022-04-29 DIAGNOSIS — C20 Malignant neoplasm of rectum: Secondary | ICD-10-CM | POA: Insufficient documentation

## 2022-04-29 DIAGNOSIS — Z9221 Personal history of antineoplastic chemotherapy: Secondary | ICD-10-CM | POA: Diagnosis not present

## 2022-04-29 DIAGNOSIS — Z923 Personal history of irradiation: Secondary | ICD-10-CM | POA: Insufficient documentation

## 2022-04-29 DIAGNOSIS — C7801 Secondary malignant neoplasm of right lung: Secondary | ICD-10-CM | POA: Diagnosis not present

## 2022-04-29 DIAGNOSIS — F17210 Nicotine dependence, cigarettes, uncomplicated: Secondary | ICD-10-CM | POA: Insufficient documentation

## 2022-04-29 DIAGNOSIS — C787 Secondary malignant neoplasm of liver and intrahepatic bile duct: Secondary | ICD-10-CM | POA: Insufficient documentation

## 2022-04-29 DIAGNOSIS — G4709 Other insomnia: Secondary | ICD-10-CM

## 2022-04-29 MED ORDER — LORAZEPAM 0.5 MG PO TABS: 0.5000 mg | ORAL_TABLET | Freq: Every evening | ORAL | 0 refills | Status: DC

## 2022-04-29 NOTE — Progress Notes (Addendum)
Radiation Oncology         [phone] ________________________________  Name: James Coffey        MRN: [id number]  Date of Service: 04/29/2022 DOB: May 17, 1971  [id number], No Pcp Per  [name], MD     REFERRING PHYSICIAN: [name], MD   DIAGNOSIS: Rectal cancer metastasized to lung [facility])   HISTORY OF PRESENT ILLNESS: James Coffey is a 51 y.o. male seen at the request of Dr. Irene Limbo. 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 He is status post left hepatectomy by Dr. [name] 08/2021, percutaneous ablation of a right liver tumor in 10/2021, as well as tumor removal, chemotherapy, and previous radiation therapy.  He was previously treated with radiation therapy to his rectum in May-June of 2023 and to his right lung in July of 2023. He tolerated treatment relatively well to both sites. Since then, he completed a robotic abdominal perineal resection and parastomal hernia repair in August 2023. On 03/16/22, he presented to the ED with a abdominal pain. CT showed an abscess in the peritoneal cavity. He was admitted to the hospital for surgical evaluation for an intra-abdominal abscess. A drain was placed in his posterior pelvic/presacral abscess. Drain was taken out without complications yesterday, 04/28/22.   Repeat CT of the chest on 03/20/22 unfortunately showed a new 2.2 x 1.5 cm nodule in the lingula which was not seen in the previous CT scan of the chest. It appeared slightly more prominent in comparison to the immediate previous CT of the abdomen on 03/16/22. He was referred to [place] today by Dr. Irene Limbo for evaluation for radiotherapy to this lesion.   Today he presents to the office and reports continued pain in his left buttock, consistent with the where the drain was taken out yesterday. He has no respiratory complaints today and denies any shortness of breath, cough, hemoptysis, chest pain, or fevers. He has experienced a  couple pounds of weight loss since his surgery.   He was given Lorazepam in the hospital to help him with sleep. Since then, he cannot stay asleep. He is up multiple times throughout the night. He does not currently have a PCP.   Of note, he does have a history of a broken left shoulder. He has limited ROM in his right arm.   PREVIOUS RADIATION THERAPY: Yes  Intent: Curative  Radiation Treatment Dates: 10/27/2021 through 12/04/2021 Site Technique Total Dose (Gy) Dose per Fx (Gy) Completed Fx Beam Energies  Rectum: Rectum 3D 45/45 1.8 25/25 10X, 15X  Rectum: Rectum_Bst 3D 5.4/5.4 1.8 3/3 10X, 15X   Narrative: The patient tolerated radiation therapy relatively well. He will also proceed with SBRT to his lung nodule in the RUL. ------------------------------------------------------------------------------------------------------------------------ Indication for treatment:  Curative        Radiation treatment dates:   12/23/21-12/30/21    Site/dose:   The tumor in the RUL was treated with a course of stereotactic body radiation treatment. The patient received 54 Gy In 3 fractions at 18 G per fraction.   Narrative: The patient tolerated radiation treatment relatively well.   The patient did not have any signs of acute toxicity during treatment.    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   Cancer ([place])    rectal cancer with mets to liver   Tobacco abuse        PAST SURGICAL HISTORY: Past  Surgical History:  Procedure Laterality Date   HERNIA REPAIR     INCISION AND DRAINAGE ABSCESS N/A 12/01/2020   Procedure: INCISION AND DRAINAGE ABSCESS;  Surgeon: Michael Boston, MD;  Location: [facility];  Service: General;  Laterality: N/A;   INCISION AND DRAINAGE ABSCESS N/A 12/04/2020   Procedure: INCISION AND DRAINAGE FOURNIERS GANGRENE;  Surgeon: [name], MD;  Location: [facility];  Service: General;  Laterality: N/A;   INCISION  AND DRAINAGE PERIRECTAL ABSCESS N/A 11/30/2020   Procedure: INCISION AND DEBRIDEMENT OF PERIRECTAL ABSCESS AND RECTAL WALL BIOPSIES;  Surgeon: Michael Boston, MD;  Location: [facility];  Service: General;  Laterality: N/A;   INGUINAL HERNIA REPAIR Right 2007   IR IMAGING GUIDED PORT INSERTION  05/07/2021   IR RADIOLOGIST EVAL & MGMT  08/27/2021   IR RADIOLOGIST EVAL & MGMT  09/12/2021   IR RADIOLOGIST EVAL & MGMT  11/10/2021   IR RADIOLOGIST EVAL & MGMT  01/05/2022   IR SINUS/FIST TUBE CHK-NON GI  04/28/2022   LAPAROSCOPIC LOOP COLOSTOMY N/A 12/13/2020   Procedure: DIAGNOSTIC LAPAROSCOPY WITH  OPEN DESCENDING LOOP COLOSTOMY;  Surgeon: Armandina Gemma, MD;  Location: [facility];  Service: General;  Laterality: N/A;   LAPAROSCOPY N/A 08/28/2021   Procedure: LAPAROSCOPY DIAGNOSTIC;  Surgeon: Dwan Bolt, MD;  Location: [place];  Service: General;  Laterality: N/A;   OPEN PARTIAL HEPATECTOMY  Left 08/28/2021   Procedure: OPEN LEFT LATERAL HEPATECTOMY WITH INTRAOPERATIVE ULTRASOUND;  Surgeon: Dwan Bolt, MD;  Location: [place];  Service: General;  Laterality: Left;   RADIOLOGY WITH ANESTHESIA N/A 10/08/2021   Procedure: MICROWAVE ABLATION;  Surgeon: Aletta Edouard, MD;  Location: [facility];  Service: Radiology;  Laterality: N/A;   RECTAL SURGERY     XI ROBOTIC ASSISTED PARASTOMAL HERNIA REPAIR N/A 02/04/2022   Procedure: ROBOTIC ABDOMINAL PERINEAL RESECTION, PARASTOMAL HERNIA REPAIR, BILATERAL TAP BLOCK;  Surgeon: [name], MD;  Location: [facility];  Service: General;  Laterality: N/A;     FAMILY HISTORY:  Family History  Problem Relation Age of Onset   CAD Neg Hx    Inflammatory bowel disease Neg Hx      SOCIAL HISTORY:  reports that he has been smoking cigarettes. He has been smoking an average of .25 packs per day. He has never used smokeless tobacco. He reports that he does not drink alcohol and does not use drugs.   ALLERGIES: Hydrocodone   MEDICATIONS:  Current Outpatient Medications   Medication Sig Dispense Refill   acetaminophen (TYLENOL) 500 MG tablet Take 2 tablets (1,000 mg total) by mouth every 8 (eight) hours as needed for mild pain or fever. (Patient taking differently: Take 1,000 mg by mouth daily as needed for mild pain or fever.)     amoxicillin-clavulanate (AUGMENTIN) 875-125 MG tablet Take 1 tablet by mouth 2 (two) times daily. 14 tablet 0   ibuprofen (ADVIL) 200 MG tablet Take 400 mg by mouth daily as needed for moderate pain.     oxyCODONE (OXY IR/ROXICODONE) 5 MG immediate release tablet Take 1 tablet (5 mg total) by mouth every 6 (six) hours as needed for moderate pain or severe pain. 20 tablet 0   prochlorperazine (COMPAZINE) 10 MG tablet Take 1 tablet (10 mg total) by mouth every 6 (six) hours as needed for nausea or vomiting. (Patient not taking: Reported on 01/21/2022) 30 tablet 1   zolpidem (AMBIEN) 10 MG tablet Take 1 tablet (10 mg total) by mouth at bedtime as needed for sleep. 30 tablet 1  No current facility-administered medications for this visit.   Facility-Administered Medications Ordered in Other Visits  Medication Dose Route Frequency Provider Last Rate Last Admin   palonosetron (ALOXI) 0.25 MG/5ML injection            sodium chloride flush (NS) 0.9 % injection 10 mL  10 mL Intracatheter Once [name], MD         REVIEW OF SYSTEMS: On review of systems, the patient reports that he is doing well overall. he denies any chest pain, shortness of breath, cough, fevers, chills, night sweats, unintended weight changes. A complete review of systems is obtained and is otherwise negative.     PHYSICAL EXAM:  Wt Readings from Last 3 Encounters:  03/16/22 175 lb (79.4 kg)  02/07/22 176 lb 5.9 oz (80 kg)  01/22/22 178 lb (80.7 kg)   Temp Readings from Last 3 Encounters:  04/20/22 98.1 F (36.7 C) (Oral)  03/22/22 99.1 F (37.3 C) (Oral)  02/08/22 98.8 F (37.1 C) (Oral)   BP Readings from Last 3 Encounters:  04/20/22 (!) 143/91   03/22/22 96/69  02/08/22 102/66   Pulse Readings from Last 3 Encounters:  04/20/22 91  03/22/22 81  02/08/22 84    /10  General: Alert and oriented, in no acute distress HEENT: Head is normocephalic. Extraocular movements are intact. Oropharynx is clear. Neck: Neck is supple, no palpable cervical or supraclavicular lymphadenopathy. Heart: Regular in rate and rhythm with no murmurs, rubs, or gallops. Chest: Clear to auscultation bilaterally, with no rhonchi, wheezes, or rales. Abdomen: Soft, nontender, nondistended, with no rigidity or guarding. Extremities: No cyanosis or edema. Lymphatics: see Neck Exam Skin: No concerning lesions. Musculoskeletal: symmetric strength and muscle tone throughout. Limited Range of motion with left arm, can only raise to 90 degrees.  Neurologic: Cranial nerves II through XII are grossly intact. No obvious focalities. Speech is fluent. Coordination is intact. Psychiatric: Judgment and insight are intact. Affect is appropriate.    ECOG = 1  0 - Asymptomatic (Fully active, able to carry on all predisease activities without restriction)  1 - Symptomatic but completely ambulatory (Restricted in physically strenuous activity but ambulatory and able to carry out work of a light or sedentary nature. For example, light housework, office work)  2 - Symptomatic, <50% in bed during the day (Ambulatory and capable of all self care but unable to carry out any work activities. Up and about more than 50% of waking hours)  3 - Symptomatic, >50% in bed, but not bedbound (Capable of only limited self-care, confined to bed or chair 50% or more of waking hours)  4 - Bedbound (Completely disabled. Cannot carry on any self-care. Totally confined to bed or chair)  5 - Death   Eustace Pen MM, Creech RH, Tormey DC, et al. [phone]). "Toxicity and response criteria of the [facility]". [place] Oncol. 5 (6): 649-55    LABORATORY DATA:  Lab Results   Component Value Date   WBC 9.7 03/18/2022   HGB 7.5 (L) 03/18/2022   HCT 24.4 (L) 03/18/2022   MCV 90.4 03/18/2022   PLT 312 03/18/2022   Lab Results  Component Value Date   NA 135 03/18/2022   K 3.7 03/18/2022   CL 104 03/18/2022   CO2 24 03/18/2022   Lab Results  Component Value Date   ALT 23 03/18/2022   AST 24 03/18/2022   ALKPHOS 156 (H) 03/18/2022   BILITOT 1.0 03/18/2022  RADIOGRAPHY: IR Sinus/Fist Tube Chk-Non GI  Result Date: 04/28/2022 CLINICAL DATA:  History of metastatic rectal cancer, post APR, found to have a air and fluid collection within the pelvic cul-de-sac postoperative abdominal CT performed 03/16/2022 for which the patient underwent a left-sided trans gluteal approach percutaneous drainage catheter. Patient reports little to no output from the drainage catheter for the past 1-2 weeks and presents today for drainage catheter evaluation and management. Preceding abdominal CT demonstrates appropriate positioning of the drainage catheter with end coiled and locked within the residual very minimally reduced residual air and fluid containing collection within the presacral space Above was discussed with providing surgeon, Dr. [name] was requested drainage catheter removal. As such, we will perform drainage catheter injection prior to drain removal for documentation purposes in the event that an additional drainage catheter is requested in the future. EXAM: SINUS TRACT INJECTION/FISTULOGRAM COMPARISON:  CT abdomen pelvis-earlier same day; 03/16/2022; CT-guided left trans gluteal approach percutaneous catheter placement-03/17/2022 CONTRAST:  10 cc Omnipaque 300-administered via the existing percutaneous drain. FLUOROSCOPY: 1 minute, 1 seconds (91 mGy) TECHNIQUE: The patient was positioned right lateral decubitus on the fluoroscopy table. A preprocedural spot fluoroscopic image was obtained of the pelvis and the existing left trans gluteal approach percutaneous drainage  catheter. Multiple spot fluoroscopic and radiographic images were obtained following the injection of a small amount of contrast via the existing percutaneous drainage catheter. Images were reviewed and discussed with the patient. The external portion of the drainage catheter was cut and the drainage catheter was removed intact. A dressing applied. The patient tolerated the procedure well without immediate postprocedural complication. FINDINGS: Preprocedural spot fluoroscopic image demonstrates stable positioning of left trans gluteal approach percutaneous drainage catheter with end coiled and locked over the air containing presacral collection. Excreted contrast is seen within the urinary bladder. Contrast injection confirmed appropriate positioning and functionality of the drainage catheter with passage of contrast through the level of the anus. There is no definitive evidence of fistulous connection to either the bladder or an adjacent loop of bowel. IMPRESSION: Drain injection confirmed appropriate positioning and functionality of the chronic left trans gluteal approach percutaneous drainage catheter with passage of contrast through the anus and without definitive evidence of fistulous connection to the intestines or the urinary bladder. Per request from Dr. [name], the drainage catheter was subsequently removed at the bedside without incident. Electronically Signed   By: Sandi Mariscal M.D.   On: 04/28/2022 14:11       IMPRESSION/PLAN: Stage IV oligometastatic rectal cancer with metastasis to the liver and lungs.  It was a pleasure seeing the patient again today. CT on 03/20/22 showed a new lingular pulmonary nodule in the left lung. This was not seen in the previous chest CT and is concerning for metastatic disease given his history.   Patient has a history of previous radiation therapy to his rectum and right lung which he tolerated quite well. He is a good candidate for and would likely benefit from  radiation therapy to the new left lung nodule. We recommend 54 Gy in 3-5 fractions. Will decide final fractions based off of CT simulation today.  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 We discussed the risks, benefits, and side effects of radiotherapy. We discussed that radiation would take approximately 2 weeks to complete. We spoke about acute effects including skin irritation and fatigue as well as much less common late effects including rib fragility,  rib fractures, internal organ injury or irritation. No guarantees of treatment were given. The patient is enthusiastic about proceeding with treatment. A consent form was signed today and he is scheduled for CT simulation later this afternoon. We look forward to participating in the patient's care.  Issues with sleep: Lorazepam worked quite well for patient while in the hospital. He is still having trouble with staying asleep. He states he is up multiple times throughout the night and is exhausted during the day. Prescribed lorazepam to help with sleep at this time.    In a visit lasting 45 minutes, greater than 50% of the time was spent face to face discussing the patient's condition, in preparation for the discussion, and coordinating the patient's care.   The above documentation reflects my direct findings during this shared patient visit. Please see the separate note by Dr. Lisbeth Renshaw on this date for the remainder of the patient's plan of care.    Leona Singleton, [place]    **Disclaimer: This note was dictated with voice recognition software. Similar sounding words can inadvertently be transcribed and this note may contain transcription errors which may not have been corrected upon publication of note.**

## 2022-04-30 ENCOUNTER — Other Ambulatory Visit: Payer: Self-pay

## 2022-05-09 ENCOUNTER — Other Ambulatory Visit: Payer: Self-pay

## 2022-05-12 ENCOUNTER — Ambulatory Visit: Payer: Medicaid Other

## 2022-05-13 ENCOUNTER — Ambulatory Visit: Payer: Medicaid Other

## 2022-05-14 ENCOUNTER — Ambulatory Visit: Payer: Medicaid Other

## 2022-05-15 ENCOUNTER — Ambulatory Visit: Payer: Medicaid Other

## 2022-05-15 DIAGNOSIS — C7801 Secondary malignant neoplasm of right lung: Secondary | ICD-10-CM | POA: Diagnosis present

## 2022-05-15 DIAGNOSIS — C20 Malignant neoplasm of rectum: Secondary | ICD-10-CM | POA: Insufficient documentation

## 2022-05-18 ENCOUNTER — Other Ambulatory Visit: Payer: Self-pay

## 2022-05-18 ENCOUNTER — Ambulatory Visit: Payer: Medicaid Other

## 2022-05-18 ENCOUNTER — Ambulatory Visit
Admission: RE | Admit: 2022-05-18 | Discharge: 2022-05-18 | Disposition: A | Discharge status: Home / Self Care | Payer: Medicaid Other | Source: Ambulatory Visit | Attending: Radiation Oncology | Admitting: Radiation Oncology

## 2022-05-18 DIAGNOSIS — C7801 Secondary malignant neoplasm of right lung: Secondary | ICD-10-CM | POA: Diagnosis not present

## 2022-05-18 LAB — RAD ONC ARIA SESSION SUMMARY
Course Elapsed Days: 0
Plan Fractions Treated to Date: 1
Plan Prescribed Dose Per Fraction: 7.5 Gy
Plan Total Fractions Prescribed: 8
Plan Total Prescribed Dose: 60 Gy
Reference Point Dosage Given to Date: 7.5 Gy
Reference Point Session Dosage Given: 7.5 Gy
Session Number: 1

## 2022-05-19 ENCOUNTER — Other Ambulatory Visit: Payer: Self-pay

## 2022-05-19 ENCOUNTER — Ambulatory Visit
Admission: RE | Admit: 2022-05-19 | Discharge: 2022-05-19 | Disposition: A | Discharge status: Home / Self Care | Payer: Medicaid Other | Source: Ambulatory Visit | Attending: Radiation Oncology | Admitting: Radiation Oncology

## 2022-05-19 ENCOUNTER — Ambulatory Visit: Payer: Medicaid Other

## 2022-05-19 DIAGNOSIS — C7801 Secondary malignant neoplasm of right lung: Secondary | ICD-10-CM | POA: Diagnosis not present

## 2022-05-19 LAB — RAD ONC ARIA SESSION SUMMARY
Course Elapsed Days: 1
Plan Fractions Treated to Date: 2
Plan Prescribed Dose Per Fraction: 7.5 Gy
Plan Total Fractions Prescribed: 8
Plan Total Prescribed Dose: 60 Gy
Reference Point Dosage Given to Date: 15 Gy
Reference Point Session Dosage Given: 7.5 Gy
Session Number: 2

## 2022-05-20 ENCOUNTER — Other Ambulatory Visit: Payer: Self-pay

## 2022-05-20 ENCOUNTER — Ambulatory Visit
Admission: RE | Admit: 2022-05-20 | Discharge: 2022-05-20 | Disposition: A | Discharge status: Home / Self Care | Payer: Medicaid Other | Source: Ambulatory Visit | Attending: Radiation Oncology | Admitting: Radiation Oncology

## 2022-05-20 DIAGNOSIS — C7801 Secondary malignant neoplasm of right lung: Secondary | ICD-10-CM | POA: Diagnosis not present

## 2022-05-20 LAB — RAD ONC ARIA SESSION SUMMARY
Course Elapsed Days: 2
Plan Fractions Treated to Date: 3
Plan Prescribed Dose Per Fraction: 7.5 Gy
Plan Total Fractions Prescribed: 8
Plan Total Prescribed Dose: 60 Gy
Reference Point Dosage Given to Date: 22.5 Gy
Reference Point Session Dosage Given: 7.5 Gy
Session Number: 3

## 2022-05-21 ENCOUNTER — Other Ambulatory Visit: Payer: Self-pay

## 2022-05-21 ENCOUNTER — Ambulatory Visit
Admission: RE | Admit: 2022-05-21 | Discharge: 2022-05-21 | Disposition: A | Discharge status: Home / Self Care | Payer: Medicaid Other | Source: Ambulatory Visit | Attending: Radiation Oncology | Admitting: Radiation Oncology

## 2022-05-21 ENCOUNTER — Ambulatory Visit: Payer: Medicaid Other

## 2022-05-21 DIAGNOSIS — C7801 Secondary malignant neoplasm of right lung: Secondary | ICD-10-CM | POA: Diagnosis not present

## 2022-05-21 LAB — RAD ONC ARIA SESSION SUMMARY
Course Elapsed Days: 3
Plan Fractions Treated to Date: 4
Plan Prescribed Dose Per Fraction: 7.5 Gy
Plan Total Fractions Prescribed: 8
Plan Total Prescribed Dose: 60 Gy
Reference Point Dosage Given to Date: 30 Gy
Reference Point Session Dosage Given: 7.5 Gy
Session Number: 4

## 2022-05-22 ENCOUNTER — Ambulatory Visit
Admission: RE | Admit: 2022-05-22 | Discharge: 2022-05-22 | Disposition: A | Discharge status: Home / Self Care | Payer: Medicaid Other | Source: Ambulatory Visit | Attending: Radiation Oncology | Admitting: Radiation Oncology

## 2022-05-22 ENCOUNTER — Other Ambulatory Visit: Payer: Self-pay

## 2022-05-22 DIAGNOSIS — C7801 Secondary malignant neoplasm of right lung: Secondary | ICD-10-CM | POA: Diagnosis not present

## 2022-05-22 LAB — RAD ONC ARIA SESSION SUMMARY
Course Elapsed Days: 4
Plan Fractions Treated to Date: 5
Plan Prescribed Dose Per Fraction: 7.5 Gy
Plan Total Fractions Prescribed: 8
Plan Total Prescribed Dose: 60 Gy
Reference Point Dosage Given to Date: 37.5 Gy
Reference Point Session Dosage Given: 7.5 Gy
Session Number: 5

## 2022-05-25 ENCOUNTER — Other Ambulatory Visit: Payer: Self-pay

## 2022-05-25 ENCOUNTER — Ambulatory Visit
Admission: RE | Admit: 2022-05-25 | Discharge: 2022-05-25 | Disposition: A | Discharge status: Home / Self Care | Payer: Medicaid Other | Source: Ambulatory Visit | Attending: Radiation Oncology | Admitting: Radiation Oncology

## 2022-05-25 DIAGNOSIS — C7801 Secondary malignant neoplasm of right lung: Secondary | ICD-10-CM | POA: Diagnosis not present

## 2022-05-25 LAB — RAD ONC ARIA SESSION SUMMARY
Course Elapsed Days: 7
Plan Fractions Treated to Date: 6
Plan Prescribed Dose Per Fraction: 7.5 Gy
Plan Total Fractions Prescribed: 8
Plan Total Prescribed Dose: 60 Gy
Reference Point Dosage Given to Date: 45 Gy
Reference Point Session Dosage Given: 7.5 Gy
Session Number: 6

## 2022-05-26 ENCOUNTER — Ambulatory Visit: Payer: Medicaid Other

## 2022-05-26 ENCOUNTER — Other Ambulatory Visit: Payer: Self-pay

## 2022-05-26 ENCOUNTER — Ambulatory Visit
Admission: RE | Admit: 2022-05-26 | Discharge: 2022-05-26 | Disposition: A | Discharge status: Home / Self Care | Payer: Medicaid Other | Source: Ambulatory Visit | Attending: Radiation Oncology | Admitting: Radiation Oncology

## 2022-05-26 DIAGNOSIS — C7801 Secondary malignant neoplasm of right lung: Secondary | ICD-10-CM | POA: Diagnosis not present

## 2022-05-26 LAB — RAD ONC ARIA SESSION SUMMARY
Course Elapsed Days: 8
Plan Fractions Treated to Date: 7
Plan Prescribed Dose Per Fraction: 7.5 Gy
Plan Total Fractions Prescribed: 8
Plan Total Prescribed Dose: 60 Gy
Reference Point Dosage Given to Date: 52.5 Gy
Reference Point Session Dosage Given: 7.5 Gy
Session Number: 7

## 2022-05-27 ENCOUNTER — Other Ambulatory Visit: Payer: Self-pay

## 2022-05-27 ENCOUNTER — Ambulatory Visit
Admission: RE | Admit: 2022-05-27 | Discharge: 2022-05-27 | Disposition: A | Discharge status: Home / Self Care | Payer: Medicaid Other | Source: Ambulatory Visit | Attending: Radiation Oncology | Admitting: Radiation Oncology

## 2022-05-27 ENCOUNTER — Encounter: Payer: Self-pay | Admitting: Radiation Oncology

## 2022-05-27 DIAGNOSIS — C7801 Secondary malignant neoplasm of right lung: Secondary | ICD-10-CM | POA: Diagnosis not present

## 2022-05-27 LAB — RAD ONC ARIA SESSION SUMMARY
Course Elapsed Days: 9
Plan Fractions Treated to Date: 8
Plan Prescribed Dose Per Fraction: 7.5 Gy
Plan Total Fractions Prescribed: 8
Plan Total Prescribed Dose: 60 Gy
Reference Point Dosage Given to Date: 60 Gy
Reference Point Session Dosage Given: 7.5 Gy
Session Number: 8

## 2022-05-28 ENCOUNTER — Ambulatory Visit: Payer: Medicaid Other

## 2022-05-28 ENCOUNTER — Other Ambulatory Visit: Payer: Self-pay | Admitting: Pharmacist

## 2022-05-28 ENCOUNTER — Encounter: Payer: Self-pay | Admitting: Hematology

## 2022-05-28 NOTE — Progress Notes (Signed)
                                                                                                                                                             Patient Name: James Coffey MRN: [id number] DOB: [birth year] Referring Physician: Sullivan Lone (Profile Not Attached) Date of Service: 05/27/2022 [facility], [place]                                                        End Of Treatment Note  Diagnoses: C20-Malignant neoplasm of rectum  Cancer Staging: Stage IV Oligometastatic cT3N2,M1 adenocarcinoma of the rectum with metastasis to the liver and lungs   Intent: Curative  Radiation Treatment Dates:  05/18/2022 through 05/27/2022 Ultrahypofractionated Radiotherapy Site Technique Total Dose (Gy) Dose per Fx (Gy) Completed Fx Beam Energies  Lung, Left:  [place] Target IMRT 60/60 7.5 8/8 6XFFF   Narrative: The patient tolerated radiation therapy relatively well.    Plan: The patient will receive a call in about one month from the radiation oncology department. He will continue follow up with Dr. Irene Limbo as well as Dr. [name] and Dr. [name].   ________________________________________________    Carola Rhine, [facility]

## 2022-06-02 ENCOUNTER — Inpatient Hospital Stay: Payer: Medicaid Other | Attending: Hematology

## 2022-06-02 ENCOUNTER — Ambulatory Visit: Payer: Medicaid Other

## 2022-06-02 ENCOUNTER — Other Ambulatory Visit: Payer: Self-pay

## 2022-06-02 ENCOUNTER — Inpatient Hospital Stay (HOSPITAL_BASED_OUTPATIENT_CLINIC_OR_DEPARTMENT_OTHER): Payer: Medicaid Other | Admitting: Hematology

## 2022-06-02 VITALS — BP 116/79 | HR 90 | Temp 97.9°F | Resp 16 | Ht 69.0 in | Wt 175.3 lb

## 2022-06-02 DIAGNOSIS — C775 Secondary and unspecified malignant neoplasm of intrapelvic lymph nodes: Secondary | ICD-10-CM | POA: Insufficient documentation

## 2022-06-02 DIAGNOSIS — C20 Malignant neoplasm of rectum: Secondary | ICD-10-CM

## 2022-06-02 DIAGNOSIS — R37 Sexual dysfunction, unspecified: Secondary | ICD-10-CM

## 2022-06-02 DIAGNOSIS — C787 Secondary malignant neoplasm of liver and intrahepatic bile duct: Secondary | ICD-10-CM | POA: Insufficient documentation

## 2022-06-02 DIAGNOSIS — Z95828 Presence of other vascular implants and grafts: Secondary | ICD-10-CM

## 2022-06-02 LAB — CBC WITH DIFFERENTIAL (CANCER CENTER ONLY)
Abs Immature Granulocytes: 0.04 10*3/uL (ref 0.00–0.07)
Basophils Absolute: 0.1 10*3/uL (ref 0.0–0.1)
Basophils Relative: 1 %
Eosinophils Absolute: 0.2 10*3/uL (ref 0.0–0.5)
Eosinophils Relative: 2 %
HCT: 36.5 % — ABNORMAL LOW (ref 39.0–52.0)
Hemoglobin: 12.3 g/dL — ABNORMAL LOW (ref 13.0–17.0)
Immature Granulocytes: 1 %
Lymphocytes Relative: 15 %
Lymphs Abs: 1.2 10*3/uL (ref 0.7–4.0)
MCH: 29.4 pg (ref 26.0–34.0)
MCHC: 33.7 g/dL (ref 30.0–36.0)
MCV: 87.3 fL (ref 80.0–100.0)
Monocytes Absolute: 0.8 10*3/uL (ref 0.1–1.0)
Monocytes Relative: 11 %
Neutro Abs: 5.6 10*3/uL (ref 1.7–7.7)
Neutrophils Relative %: 70 %
Platelet Count: 261 10*3/uL (ref 150–400)
RBC: 4.18 MIL/uL — ABNORMAL LOW (ref 4.22–5.81)
RDW: 15.7 % — ABNORMAL HIGH (ref 11.5–15.5)
WBC Count: 7.8 10*3/uL (ref 4.0–10.5)
nRBC: 0 % (ref 0.0–0.2)

## 2022-06-02 LAB — CMP (CANCER CENTER ONLY)
ALT: 9 U/L (ref 0–44)
AST: 11 U/L — ABNORMAL LOW (ref 15–41)
Albumin: 3.9 g/dL (ref 3.5–5.0)
Alkaline Phosphatase: 117 U/L (ref 38–126)
Anion gap: 6 (ref 5–15)
BUN: 12 mg/dL (ref 6–20)
CO2: 27 mmol/L (ref 22–32)
Calcium: 9.5 mg/dL (ref 8.9–10.3)
Chloride: 105 mmol/L (ref 98–111)
Creatinine: 0.68 mg/dL (ref 0.61–1.24)
GFR, Estimated: >60 mL/min (ref >60)
Glucose, Bld: 170 mg/dL — ABNORMAL HIGH (ref 70–99)
Potassium: 4 mmol/L (ref 3.5–5.1)
Sodium: 138 mmol/L (ref 135–145)
Total Bilirubin: 0.5 mg/dL (ref 0.3–1.2)
Total Protein: 7.2 g/dL (ref 6.5–8.1)

## 2022-06-02 LAB — FERRITIN: Ferritin: 66 ng/mL (ref 24–336)

## 2022-06-02 LAB — CEA (IN HOUSE-CHCC): CEA (CHCC-In House): 4.53 ng/mL (ref 0.00–5.00)

## 2022-06-02 MED ORDER — SENNOSIDES-DOCUSATE SODIUM 8.6-50 MG PO TABS: 2.0000 | ORAL_TABLET | Freq: Every day | ORAL | 1 refills | Status: DC

## 2022-06-02 MED ORDER — HEPARIN SOD (PORK) LOCK FLUSH 100 UNIT/ML IV SOLN
500.0000 [IU] | Freq: Once | INTRAVENOUS | Status: AC
  Administered 2022-06-02: 500 [IU]

## 2022-06-02 MED ORDER — SODIUM CHLORIDE 0.9% FLUSH
10.0000 mL | Freq: Once | INTRAVENOUS | Status: AC
  Administered 2022-06-02: 10 mL

## 2022-06-04 ENCOUNTER — Ambulatory Visit: Payer: Medicaid Other

## 2022-06-09 ENCOUNTER — Other Ambulatory Visit (HOSPITAL_COMMUNITY): Payer: Self-pay

## 2022-06-09 ENCOUNTER — Ambulatory Visit: Payer: Medicaid Other

## 2022-06-09 ENCOUNTER — Encounter: Payer: Self-pay | Admitting: Hematology

## 2022-06-09 NOTE — Progress Notes (Addendum)
[name]   [phone] [phone]   Clinic Follow up Note   Patient Care Team: Patient, No Pcp Per as PCP - General ([place]) James Bring, MD as Consulting Physician (Urology) James Lobo, MD as Consulting Physician (Gastroenterology) James Ruff, MD as Consulting Physician (Colon and Rectal Surgery) James Genera, MD as Consulting Physician (Oncology) James Bake, RN as Oncology Nurse Navigator (Oncology)  Date of Service:06/02/2022   CHIEF COMPLAINT:  Follow-up for continued evaluation and management of oligometastatic rectal cancer  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[place])  12/17/2020 Initial Diagnosis   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[place])   01/13/2021 Cancer Staging   Staging form: Colon and Rectum, AJCC 8th Edition - Clinical stage from 01/13/2021: Stage IIIB (cT3, cN2a, cM0) - Signed by James Merle, MD on 01/19/2021 Stage prefix: Initial diagnosis Histologic grade (G): G2 Histologic grading system: 4 grade system   01/20/2021 - 03/26/2021 Chemotherapy   Patient is on Treatment Plan : RECTAL Xelox ([place]) q21d x 6 cycles     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   INTERVAL HISTORY:   James Coffey is a 52 y.o. male is here for continued valuation and management of oligometastatic rectal cancer. Since his last clinic visit with medical oncology he underwent robotic abdominal perineal resection, parastomal hernia repair by Dr. Leighton Coffey on  07/10/5425 .  He subsequently mated to the hospital in October 2023 with postoperative abscess.  His perineal wound is healing by secondary intention and he continues to follow with Dr. Marcello Coffey regarding this. His CT of the abdomen and pelvis on 04/28/2022 showed decrease in his pelvic abscess but enlarging left lower lobe pulmonary nodule.  He is following with radiation oncology for SBRT of this.  There was also concern for hepatic lesions which were thought to be possibly new. CEA levels today are at 4.5  CBC shows improvement in his hemoglobin to 12.3 from a previous level of 9.5 in October CMP shows stable renal function and liver function. We discussed repeating imaging studies within the next 6 to 8 weeks He is significantly distressed due to decrease in size of his penis and difficulty with sexual function. We had a detailed discussion about the potential etiologies of his sexual dysfunction including his significant surgeries and pelvic infections along with previous radiation and chemotherapy. We discussed checking his testosterone levels and considering the possible use of Viagra.  He was also offered and accepted an option for referral to urology. We had detailed goals of care discussion and he is aware that his treatment is not curative but has been aggressive to try to buy more time.  In person interpreter was present during this visit.  MEDICAL HISTORY:  Past Medical History:  Diagnosis Date   Cancer [facility])    rectal cancer with mets to liver   Tobacco abuse     SURGICAL HISTORY: Past Surgical History:  Procedure Laterality Date   HERNIA REPAIR     INCISION AND DRAINAGE ABSCESS N/A 12/01/2020   Procedure: INCISION AND DRAINAGE ABSCESS;  Surgeon: James Boston, MD;  Location: [facility];  Service: General;  Laterality: N/A;   INCISION AND DRAINAGE ABSCESS N/A 12/04/2020   Procedure: INCISION AND DRAINAGE FOURNIERS GANGRENE;  Surgeon: James Hausen, MD;  Location: [facility];  Service:  General;  Laterality: N/A;   INCISION AND DRAINAGE PERIRECTAL ABSCESS N/A 11/30/2020   Procedure: INCISION AND DEBRIDEMENT OF PERIRECTAL ABSCESS AND RECTAL WALL BIOPSIES;  Surgeon: James Boston, MD;  Location: [facility];  Service: General;  Laterality: N/A;   INGUINAL HERNIA REPAIR Right 2007   IR IMAGING GUIDED PORT INSERTION  05/07/2021   IR RADIOLOGIST EVAL & MGMT  08/27/2021   IR RADIOLOGIST EVAL & MGMT  09/12/2021   IR RADIOLOGIST EVAL & MGMT  11/10/2021   IR RADIOLOGIST EVAL & MGMT  01/05/2022   LAPAROSCOPIC LOOP COLOSTOMY N/A 12/13/2020   Procedure: DIAGNOSTIC LAPAROSCOPY WITH  OPEN DESCENDING LOOP COLOSTOMY;  Surgeon: James Gemma, MD;  Location: [facility];  Service: General;  Laterality: N/A;   LAPAROSCOPY N/A 08/28/2021   Procedure: LAPAROSCOPY DIAGNOSTIC;  Surgeon: James Bolt, MD;  Location: [place];  Service: General;  Laterality: N/A;   OPEN PARTIAL HEPATECTOMY  Left 08/28/2021   Procedure: OPEN LEFT LATERAL HEPATECTOMY WITH INTRAOPERATIVE ULTRASOUND;  Surgeon: James Bolt, MD;  Location: [place];  Service: General;  Laterality: Left;   RADIOLOGY WITH ANESTHESIA N/A 10/08/2021   Procedure: MICROWAVE ABLATION;  Surgeon: James Edouard, MD;  Location: [facility];  Service: Radiology;  Laterality: N/A;   RECTAL SURGERY      ALLERGIES:  is allergic to hydrocodone.  ROS 10 Point review of Systems was done is negative except as noted above.   MEDICATIONS:  Current Outpatient Medications  Medication Sig Dispense Refill   acetaminophen (TYLENOL) 500 MG tablet Take 2 tablets (1,000 mg total) by mouth every 8 (eight) hours as needed for mild pain or fever.     docusate sodium (COLACE) 100 MG capsule Take 1 capsule (100 mg total) by mouth 2 (two) times daily. (Patient not taking: Reported on 01/21/2022) 10 capsule 0   ibuprofen (ADVIL) 200 MG tablet Take 400 mg by mouth every 6 (six) hours as needed for moderate pain.     loperamide (IMODIUM) 2 MG capsule Take 2 tablets by mouth at onset of  diarrhea, then 1 tab every 2hrs until 12hr without a BM. May take 2 tab every 4hrs at bedtime. If diarrhea recurs repeat. (Patient not taking: Reported on 09/29/2021) 30 capsule 1   LORazepam (ATIVAN) 0.5 MG tablet Take 1 tablet by mouth every 6 hours as needed for anxiety. (Patient not taking: Reported on 01/21/2022) 30 tablet 0   prochlorperazine (COMPAZINE) 10 MG tablet Take 1 tablet (10 mg total) by mouth every 6 (six) hours as needed for nausea or vomiting. (Patient not taking: Reported on 01/21/2022) 30 tablet 1   No current facility-administered medications for this visit.   Facility-Administered Medications Ordered in Other Visits  Medication Dose Route Frequency Provider Last Rate Last Admin   palonosetron (ALOXI) 0.25 MG/5ML injection            sodium chloride flush (NS) 0.9 % injection 10 mL  10 mL Intracatheter Once James Genera, MD        PHYSICAL EXAMINATION: ECOG PERFORMANCE STATUS: 1 - Symptomatic but completely ambulatory  Vital signs reviewed Wt Readings from Last 3 Encounters:  01/22/22 178 lb (80.7 kg)  01/20/22 180 lb 14.4 oz (82.1 kg)  12/01/21 177 lb 9.6 oz (80.6 kg)  NAD GENERAL:alert, in no acute distress and comfortable SKIN: no acute rashes, no significant lesions EYES: conjunctiva are pink and non-injected, sclera anicteric OROPHARYNX: MMM, no exudates, no oropharyngeal erythema or ulceration NECK: supple, no JVD LYMPH:  no palpable lymphadenopathy in the cervical, axillary or inguinal regions LUNGS: clear to auscultation b/l with normal respiratory effort HEART: regular rate & rhythm ABDOMEN:  normoactive bowel sounds , non tender, not distended. Extremity: no pedal edema PSYCH: alert & oriented x 3 with fluent speech NEURO: no focal motor/sensory deficits   LABORATORY DATA:  .    Latest Ref Rng & Units 06/02/2022   12:25 PM 03/18/2022    1:08 AM 03/17/2022    4:38 AM  CBC  WBC 4.0 - 10.5 K/uL 7.8  9.7  9.5   Hemoglobin 13.0 - 17.0 g/dL  12.3  7.5  7.9   Hematocrit 39.0 - 52.0 % 36.5  24.4  26.8   Platelets 150 - 400 K/uL 261  312  316    .    Latest Ref Rng & Units 06/02/2022   12:25 PM 03/18/2022    1:08 AM 03/17/2022    4:38 AM  CMP  Glucose 70 - 99 mg/dL 170  129  89   BUN 6 - 20 mg/dL _0 Creatinine 0.61 - 1.24 mg/dL 0.68  0.70  0.65   Sodium 135 - 145 mmol/L 138  135  135   Potassium 3.5 - 5.1 mmol/L 4.0  3.7  4.1   Chloride 98 - 111 mmol/L 105  104  104   CO2 22 - 32 mmol/L _1 Calcium 8.9 - 10.3 mg/dL 9.5  8.2  8.6   Total Protein 6.5 - 8.1 g/dL 7.2  6.6  6.5   Total Bilirubin 0.3 - 1.2 mg/dL 0.5  1.0  1.3   Alkaline Phos 38 - 126 U/L 117  156  121   AST 15 - 41 U/L _2 ALT 0 - 44 U/L _3 Lab Results  Component Value Date   FERRITIN 66 06/02/2022     SURGICAL PATHOLOGY   THIS IS AN ADDENDUM REPORT   CASE: [id number]  PATIENT: James Coffey  Surgical Pathology Report  Addendum    Reason for Addendum #1:  DNA Mismatch Repair IHC Results   Clinical History: Fournier's Gangrene; Cancer ? ([facility])      FINAL MICROSCOPIC DIAGNOSIS:   A. IRRIGATION AND DEBRIDEMENT OF SCROTUM AND PENIS:  Acute necrotizing inflammation extensively involving fascia    B. Rectal wall biopsies:  Adenocarcinoma of the rectum        RADIOGRAPHIC STUDIES: I have personally reviewed the radiological images as listed and agreed with the findings in the report. No results found.   ASSESSMENT & PLAN:   James Coffey is a 52 y.o. male with   1.  Oligometastatic rectal cancer with 2 visible liver metastases solitary lesion in the right upper lobe of the lung.-newly noted on CT chest abdomen pelvis. KRAS mutated MMR IHC normal  2.  Initially diagnosed locally Advanced Rectal Adenocarcinoma, c(T3c, N2), mismatch repair protein normal -presented to ED on 11/29/20 with septic shock secondary to Fournier's gangrene -CT AP showed findings suspicious for necrotizing perineal soft  tissue infection with irregular presacral nodule. Rectal biopsies obtained on 11/30/20 confirmed rectal adenocarcinoma. -Baseline CEA on 12/09/20 was elevated at 16.7. -He underwent diagnostic laparoscopy  and open descending loop colostomy on 12/13/20 with Dr. Harlow Asa. -Chest CT on 12/14/20 was negative for metastatic disease. -Repeat CEA on 01/06/21 showed further elevation to 52.55. -staging pelvis MRI on 01/12/21 showed: rectal adenocarcinoma T stage: At least T3c, possible involvement of prostatic apex; N stage: N2; long segment rectal tumor, involving anal sphincters. -s/p neoadjuvant FOLFOX chemotherapy S/p resection of liver met and thermal ablation of 2nd liver met Currently completing Chemo radiation of rectal mass Plan for SBRT of isolated lung lesion.  3.  Iron deficiency-currently anemia has resolved and ferritin is reasonable Lab Results  Component Value Date   FERRITIN 66 06/02/2022   4 Insomnia -lorazepam prn  5. Recurrent PERIANAL ABSCESS.  Patient hospitalized in October for pelvic abscess status post abdominal perineal resection.  6.  Multifactorial sexual dysfunction PLAN -Patient's labs done today were reviewed in detail.  CEA level slightly crept up. -He is continue to heal from his perineal wound by secondary intention and continues to follow-up with Dr. Marcello Coffey to monitor this. -He notes some constipation and harder stools and was given a prescription for senna S -He notes he is quite frustrated with his sexual dysfunction which we discussed at length and addressed his concerns and discussed the potential etiologies involved in this.  He does understand he is having life prolonging treatments that do have a bearing on his sexual function. -We offered him an option to check his testosterone levels and replace these if these are low and he would like to have labs in January. -We also recommended and patient is agreeable to get urology referral -Did not want to try Viagra at this  time. -Will plan to get a repeat CT chest abdomen pelvis in about 4 to 5 weeks for interval reevaluation -Has recovered reasonably from his SBRT to his new lung lesion recently. -Goals of care discussion continued  Follow-up -Referral to Urology for concerns of sexual dysfunction -- inability to have erection and ejaculation. -portflush and labs in 2 weeks (patient morning appointment for labs) -RTC with Dr Irene Limbo with portflush and labs in 6 weeks   The total time spent in the appointment was 35 minutes*.  All of the patient's questions were answered with apparent satisfaction. The patient knows to call the clinic with any problems, questions or concerns.   Sullivan Lone MD MS AAHIVMS [facility] Hematology/Oncology Physician [facility]  .*Total Encounter Time as defined by the Centers for Medicare and Medicaid Services includes, in addition to the face-to-face time of a patient visit (documented in the note above) non-face-to-face time: obtaining and reviewing outside history, ordering and reviewing medications, tests or procedures, care coordination (communications with other health care professionals or caregivers) and documentation in the medical record.

## 2022-06-09 NOTE — Addendum Note (Signed)
Addended by: Sullivan Lone on: 06/09/2022 01:57 AM   Modules accepted: Orders

## 2022-06-11 ENCOUNTER — Ambulatory Visit: Payer: Medicaid Other

## 2022-06-15 ENCOUNTER — Other Ambulatory Visit: Payer: Self-pay

## 2022-06-15 DIAGNOSIS — C20 Malignant neoplasm of rectum: Secondary | ICD-10-CM

## 2022-06-16 ENCOUNTER — Other Ambulatory Visit (HOSPITAL_COMMUNITY): Payer: Self-pay

## 2022-06-16 ENCOUNTER — Inpatient Hospital Stay: Payer: Medicaid Other | Attending: Hematology

## 2022-06-16 ENCOUNTER — Other Ambulatory Visit: Payer: Self-pay

## 2022-06-16 DIAGNOSIS — C20 Malignant neoplasm of rectum: Secondary | ICD-10-CM | POA: Diagnosis present

## 2022-06-16 DIAGNOSIS — C787 Secondary malignant neoplasm of liver and intrahepatic bile duct: Secondary | ICD-10-CM | POA: Insufficient documentation

## 2022-06-16 DIAGNOSIS — Z95828 Presence of other vascular implants and grafts: Secondary | ICD-10-CM

## 2022-06-16 LAB — CBC WITH DIFFERENTIAL (CANCER CENTER ONLY)
Abs Immature Granulocytes: 0.02 10*3/uL (ref 0.00–0.07)
Basophils Absolute: 0.1 10*3/uL (ref 0.0–0.1)
Basophils Relative: 1 %
Eosinophils Absolute: 0.1 10*3/uL (ref 0.0–0.5)
Eosinophils Relative: 2 %
HCT: 35.7 % — ABNORMAL LOW (ref 39.0–52.0)
Hemoglobin: 11.7 g/dL — ABNORMAL LOW (ref 13.0–17.0)
Immature Granulocytes: 0 %
Lymphocytes Relative: 12 %
Lymphs Abs: 0.9 10*3/uL (ref 0.7–4.0)
MCH: 28.5 pg (ref 26.0–34.0)
MCHC: 32.8 g/dL (ref 30.0–36.0)
MCV: 87.1 fL (ref 80.0–100.0)
Monocytes Absolute: 0.9 10*3/uL (ref 0.1–1.0)
Monocytes Relative: 11 %
Neutro Abs: 5.7 10*3/uL (ref 1.7–7.7)
Neutrophils Relative %: 74 %
Platelet Count: 262 10*3/uL (ref 150–400)
RBC: 4.1 MIL/uL — ABNORMAL LOW (ref 4.22–5.81)
RDW: 14.6 % (ref 11.5–15.5)
WBC Count: 7.7 10*3/uL (ref 4.0–10.5)
nRBC: 0 % (ref 0.0–0.2)

## 2022-06-16 LAB — CMP (CANCER CENTER ONLY)
ALT: 8 U/L (ref 0–44)
AST: 9 U/L — ABNORMAL LOW (ref 15–41)
Albumin: 3.7 g/dL (ref 3.5–5.0)
Alkaline Phosphatase: 109 U/L (ref 38–126)
Anion gap: 7 (ref 5–15)
BUN: 10 mg/dL (ref 6–20)
CO2: 25 mmol/L (ref 22–32)
Calcium: 9.2 mg/dL (ref 8.9–10.3)
Chloride: 106 mmol/L (ref 98–111)
Creatinine: 0.64 mg/dL (ref 0.61–1.24)
GFR, Estimated: >60 mL/min (ref >60)
Glucose, Bld: 146 mg/dL — ABNORMAL HIGH (ref 70–99)
Potassium: 3.6 mmol/L (ref 3.5–5.1)
Sodium: 138 mmol/L (ref 135–145)
Total Bilirubin: 0.7 mg/dL (ref 0.3–1.2)
Total Protein: 7.3 g/dL (ref 6.5–8.1)

## 2022-06-16 MED ORDER — HEPARIN SOD (PORK) LOCK FLUSH 100 UNIT/ML IV SOLN
500.0000 [IU] | Freq: Once | INTRAVENOUS | Status: AC
  Administered 2022-06-16: 500 [IU]

## 2022-06-16 MED ORDER — SODIUM CHLORIDE 0.9% FLUSH
10.0000 mL | Freq: Once | INTRAVENOUS | Status: AC
  Administered 2022-06-16: 10 mL

## 2022-06-17 ENCOUNTER — Other Ambulatory Visit: Payer: Self-pay | Admitting: General Surgery

## 2022-06-17 DIAGNOSIS — K435 Parastomal hernia without obstruction or  gangrene: Secondary | ICD-10-CM

## 2022-06-18 ENCOUNTER — Encounter: Payer: Self-pay | Admitting: Hematology

## 2022-06-19 ENCOUNTER — Other Ambulatory Visit: Payer: Self-pay

## 2022-06-22 ENCOUNTER — Encounter: Payer: Self-pay | Admitting: Hematology

## 2022-06-22 ENCOUNTER — Other Ambulatory Visit (HOSPITAL_COMMUNITY): Payer: Self-pay

## 2022-06-22 ENCOUNTER — Other Ambulatory Visit: Payer: Self-pay | Admitting: Hematology

## 2022-06-22 DIAGNOSIS — G4709 Other insomnia: Secondary | ICD-10-CM

## 2022-06-22 MED ORDER — OXYCODONE HCL 5 MG PO TABS
5.0000 mg | ORAL_TABLET | Freq: Four times a day (QID) | ORAL | 0 refills | Status: DC | PRN
  Filled 2022-06-22: qty 20, 5d supply, fill #0

## 2022-06-22 MED ORDER — PROCHLORPERAZINE MALEATE 10 MG PO TABS
10.0000 mg | ORAL_TABLET | Freq: Four times a day (QID) | ORAL | 0 refills | Status: DC | PRN
  Filled 2022-06-22: qty 30, 8d supply, fill #0

## 2022-06-22 MED ORDER — LORAZEPAM 0.5 MG PO TABS
0.5000 mg | ORAL_TABLET | Freq: Every evening | ORAL | 0 refills | Status: DC
  Filled 2022-06-22: qty 30, 30d supply, fill #0

## 2022-06-24 ENCOUNTER — Other Ambulatory Visit: Payer: Self-pay

## 2022-06-24 ENCOUNTER — Encounter (HOSPITAL_COMMUNITY): Payer: Self-pay | Admitting: *Deleted

## 2022-06-24 ENCOUNTER — Emergency Department (HOSPITAL_COMMUNITY)
Admission: EM | Admit: 2022-06-24 | Discharge: 2022-06-24 | Disposition: A | Discharge status: Home / Self Care | Payer: Medicaid Other | Attending: Emergency Medicine | Admitting: Emergency Medicine

## 2022-06-24 ENCOUNTER — Emergency Department (HOSPITAL_COMMUNITY): Payer: Medicaid Other

## 2022-06-24 DIAGNOSIS — Z85048 Personal history of other malignant neoplasm of rectum, rectosigmoid junction, and anus: Secondary | ICD-10-CM | POA: Insufficient documentation

## 2022-06-24 DIAGNOSIS — C799 Secondary malignant neoplasm of unspecified site: Secondary | ICD-10-CM | POA: Insufficient documentation

## 2022-06-24 DIAGNOSIS — R109 Unspecified abdominal pain: Secondary | ICD-10-CM | POA: Diagnosis present

## 2022-06-24 DIAGNOSIS — K435 Parastomal hernia without obstruction or  gangrene: Secondary | ICD-10-CM

## 2022-06-24 DIAGNOSIS — R1084 Generalized abdominal pain: Secondary | ICD-10-CM

## 2022-06-24 LAB — CBC WITH DIFFERENTIAL/PLATELET
Abs Immature Granulocytes: 0.04 10*3/uL (ref 0.00–0.07)
Basophils Absolute: 0 10*3/uL (ref 0.0–0.1)
Basophils Relative: 1 %
Eosinophils Absolute: 0.1 10*3/uL (ref 0.0–0.5)
Eosinophils Relative: 1 %
HCT: 39.2 % (ref 39.0–52.0)
Hemoglobin: 12.3 g/dL — ABNORMAL LOW (ref 13.0–17.0)
Immature Granulocytes: 1 %
Lymphocytes Relative: 18 %
Lymphs Abs: 1.1 10*3/uL (ref 0.7–4.0)
MCH: 27.5 pg (ref 26.0–34.0)
MCHC: 31.4 g/dL (ref 30.0–36.0)
MCV: 87.7 fL (ref 80.0–100.0)
Monocytes Absolute: 0.7 10*3/uL (ref 0.1–1.0)
Monocytes Relative: 11 %
Neutro Abs: 4.3 10*3/uL (ref 1.7–7.7)
Neutrophils Relative %: 68 %
Platelets: 308 10*3/uL (ref 150–400)
RBC: 4.47 MIL/uL (ref 4.22–5.81)
RDW: 14.7 % (ref 11.5–15.5)
WBC: 6.2 10*3/uL (ref 4.0–10.5)
nRBC: 0 % (ref 0.0–0.2)

## 2022-06-24 LAB — COMPREHENSIVE METABOLIC PANEL
ALT: 23 U/L (ref 0–44)
AST: 16 U/L (ref 15–41)
Albumin: 3.4 g/dL — ABNORMAL LOW (ref 3.5–5.0)
Alkaline Phosphatase: 108 U/L (ref 38–126)
Anion gap: 8 (ref 5–15)
BUN: 8 mg/dL (ref 6–20)
CO2: 24 mmol/L (ref 22–32)
Calcium: 8.8 mg/dL — ABNORMAL LOW (ref 8.9–10.3)
Chloride: 102 mmol/L (ref 98–111)
Creatinine, Ser: 0.83 mg/dL (ref 0.61–1.24)
GFR, Estimated: >60 mL/min (ref >60)
Glucose, Bld: 139 mg/dL — ABNORMAL HIGH (ref 70–99)
Potassium: 3.8 mmol/L (ref 3.5–5.1)
Sodium: 134 mmol/L — ABNORMAL LOW (ref 135–145)
Total Bilirubin: 0.8 mg/dL (ref 0.3–1.2)
Total Protein: 7.5 g/dL (ref 6.5–8.1)

## 2022-06-24 LAB — URINALYSIS, ROUTINE W REFLEX MICROSCOPIC
Bilirubin Urine: NEGATIVE
Glucose, UA: NEGATIVE mg/dL
Hgb urine dipstick: NEGATIVE
Ketones, ur: NEGATIVE mg/dL
Leukocytes,Ua: NEGATIVE
Nitrite: NEGATIVE
Protein, ur: NEGATIVE mg/dL
Specific Gravity, Urine: 1.019 (ref 1.005–1.030)
pH: 5 (ref 5.0–8.0)

## 2022-06-24 LAB — LIPASE, BLOOD: Lipase: 26 U/L (ref 11–51)

## 2022-06-24 MED ORDER — IOHEXOL 300 MG/ML  SOLN
100.0000 mL | Freq: Once | INTRAMUSCULAR | Status: AC | PRN
  Administered 2022-06-24: 100 mL via INTRAVENOUS

## 2022-06-24 MED ORDER — POLYETHYLENE GLYCOL 3350 17 G PO PACK: 17.0000 g | PACK | Freq: Every day | ORAL | 0 refills | Status: DC

## 2022-06-24 NOTE — ED Notes (Signed)
Pt ambulatory to restroom independently.

## 2022-06-24 NOTE — ED Provider Notes (Signed)
[facility] DEPT Provider Note   CSN: [id number] Arrival date & time: 06/24/22  0920     History  Chief Complaint  Patient presents with   Abdominal Pain    James Coffey is a 52 y.o. male.   Abdominal Pain    Patient has history of rectal adenocarcinoma, status post I&D of a perirectal abscess, status post loop colostomy.  Pt states he has been having pain in spasm in his abdomen. Pt states he has noticed some hard stool from the colostomy bag. He tends to feel better after having a bowel movement.  He was admitted to the hospital in October of last year with an abscess of the abdominal cavity.  He was evaluated by general surgery as well as interventional radiology.  He had IR drainage of the abscess.  Patient was following up with Dr. [name] general surgery.  Patient states He was told to get a CT scan. He had not heard from them in 3 weeks so the patient came to the ED today to have his CT scan. . No fevers but he has had some intermittent vomiting.    Home Medications Prior to Admission medications   Medication Sig Start Date End Date Taking? Authorizing Provider  polyethylene glycol (MIRALAX) 17 g packet Take 17 g by mouth daily. Take daily to maintain regular soft colostomy output. Can increase to twice a day if need. 06/24/22  Yes Winferd Humphrey, PA-C  acetaminophen (TYLENOL) 500 MG tablet Take 2 tablets (1,000 mg total) by mouth every 8 (eight) hours as needed for mild pain or fever. 12/19/20   Norm Parcel, PA-C  amoxicillin-clavulanate (AUGMENTIN) 875-125 MG tablet Take 1 tablet by mouth 2 (two) times daily. 03/22/22   Samuella Cota, MD  LORazepam (ATIVAN) 0.5 MG tablet Take 1 tablet (0.5 mg total) by mouth at bedtime. 06/22/22   [name], MD  oxyCODONE (OXY IR/ROXICODONE) 5 MG immediate release tablet Take 1 tablet (5 mg total) by mouth every 6 (six) hours as needed for moderate pain or severe pain. 06/22/22   [name], MD  prochlorperazine (COMPAZINE) 10 MG tablet Take 1 tablet (10 mg total) by mouth every 6 (six) hours as needed for nausea or vomiting. 06/22/22   [name], MD  senna-docusate (SENNA S) 8.6-50 MG tablet Take 2 tablets by mouth at bedtime. 06/02/22   [name], MD  zolpidem (AMBIEN) 10 MG tablet Take 1 tablet (10 mg total) by mouth at bedtime as needed for sleep. 03/22/22   Samuella Cota, MD      Allergies    Hydrocodone    Review of Systems   Review of Systems  Gastrointestinal:  Positive for abdominal pain.    Physical Exam Updated Vital Signs BP 98/75   Pulse 75   Temp 98.7 F (37.1 C) (Oral)   Resp 17   Ht 1.753 m ('5\' 9"'$ )   Wt 79.4 kg   SpO2 98%   BMI 25.84 kg/m  Physical Exam Vitals and nursing note reviewed.  Constitutional:      Appearance: He is well-developed. He is not diaphoretic.  HENT:     Head: Normocephalic and atraumatic.     Right Ear: External ear normal.     Left Ear: External ear normal.  Eyes:     General: No scleral icterus.       Right eye: No discharge.        Left eye: No  discharge.     Conjunctiva/sclera: Conjunctivae normal.  Neck:     Trachea: No tracheal deviation.  Cardiovascular:     Rate and Rhythm: Normal rate and regular rhythm.  Pulmonary:     Effort: Pulmonary effort is normal. No respiratory distress.     Breath sounds: Normal breath sounds. No stridor. No wheezing or rales.  Abdominal:     General: Bowel sounds are normal. There is no distension.     Palpations: Abdomen is soft.     Tenderness: There is no abdominal tenderness. There is no guarding or rebound.     Hernia: A hernia is present.     Comments: Ostomy left lower quadrant, parastomal hernia, soft, no induration or incarceration  Musculoskeletal:        General: No tenderness or deformity.     Cervical back: Neck supple.  Skin:    General: Skin is warm and dry.     Findings: No rash.  Neurological:     General: No focal  deficit present.     Mental Status: He is alert.     Cranial Nerves: No cranial nerve deficit, dysarthria or facial asymmetry.     Sensory: No sensory deficit.     Motor: No abnormal muscle tone or seizure activity.     Coordination: Coordination normal.  Psychiatric:        Mood and Affect: Mood normal.     ED Results / Procedures / Treatments   Labs (all labs ordered are listed, but only abnormal results are displayed) Labs Reviewed  CBC WITH DIFFERENTIAL/PLATELET - Abnormal; Notable for the following components:      Result Value   Hemoglobin 12.3 (*)    All other components within normal limits  COMPREHENSIVE METABOLIC PANEL - Abnormal; Notable for the following components:   Sodium 134 (*)    Glucose, Bld 139 (*)    Calcium 8.8 (*)    Albumin 3.4 (*)    All other components within normal limits  LIPASE, BLOOD  URINALYSIS, ROUTINE W REFLEX MICROSCOPIC    EKG None  Radiology CT Abdomen Pelvis W Contrast  Result Date: 06/24/2022 CLINICAL DATA:  Abdominal pain for 3 weeks. Metastatic rectal cancer. Previous abscess EXAM: CT ABDOMEN AND PELVIS WITH CONTRAST TECHNIQUE: Multidetector CT imaging of the abdomen and pelvis was performed using the standard protocol following bolus administration of intravenous contrast. RADIATION DOSE REDUCTION: This exam was performed according to the departmental dose-optimization program which includes automated exposure control, adjustment of the mA and/or kV according to patient size and/or use of iterative reconstruction technique. CONTRAST:  142m OMNIPAQUE IOHEXOL 300 MG/ML  SOLN COMPARISON:  CT 04/28/2022 and older FINDINGS: Lower chest: Breathing motion at the lung bases. There is some linear opacity seen likely scar or atelectasis. No pleural effusion. There is a mass in the lingula on series 9, image 18 this measures 2.5 by 1.9 cm and previously when measured in the same fashion as today 2.6 by 1.9 cm. Coronary artery calcifications are seen.  Please correlate for other coronary risk factors. Hepatobiliary: Liver metastases are identified. At least 3 large lesions are identified. Dome lesion near the margin of the IVC and hepatic vein centrally on the previous examination measured 3.5 x 2.9 cm and today is larger at 6.6 by 5.8 cm. The peripheral lesion at the same level on the previous exam in segment 4 measured 2.7 x 2.1 cm and today on image 15 measures 5.0 by 3.3 cm. Lesion seen more caudal  along segment 5 is similar. There is some tiny foci seen in segment 3 as well. Underlying fatty liver infiltration. Gallbladder is nondilated. Patent portal vein. Pancreas: Moderate atrophy of the pancreas without mass lesion is similar to previous. Spleen: Spleen is nonenlarged.  Preserved enhancement. Adrenals/Urinary Tract: The adrenal glands are preserved. No enhancing renal mass. There is a punctate low-density focus medially in the upper pole left kidney measuring 3-4 mm, too small to completely characterize although likely small cysts. Focus in the upper pole of the right kidney medially is also seen and stable from prior measuring maximal diameter of 2.1 cm and Hounsfield unit of 2 consistent with a Bosniak 1 cyst. No specific imaging follow-up recommended. No collecting system dilatation. The ureters have a normal course and caliber down to the bladder. Bladder has a preserved contour but has diffuse mild wall thickening. Stomach/Bowel: Stomach is mildly distended with fluid. The duodenal mass normal course and caliber as does the rest of the small bowel. The exception is a large left-sided anterior abdominal wall hernia along the margin of the rectus abdominis muscle. Again this is a large hernia with herniation of mesenteric fat, small bowel parastomal hernia due to the associated colostomy location. The colon proximal to this is nondilated with scattered colonic stool. There are some loops of small bowel in the central pelvis above the bladder which have  wall thickening. There is some mesenteric stranding. Please correlate for infectious or inflammatory process. Please correlate for any history of radiation. The wall thickening is increased from previous. Previously there was a pigtail catheter in the fluid collection along the surgical bed along the rectum in the presacral space with mass effect along the prostate in the posterior wall of the bladder. There is some residual gas and fluid in this location today measuring 4.0 by 2.0 cm on series 2, image 87. This is slightly larger. The amount fluid is increased relative to air. The pigtail catheter is no longer seen. Please correlate for history of abscess. Just to the left this is a small area of nodular tissue on series 4, image 16 measuring 19 by 17 mm which is unchanged from previous. There is some simple fluid along the dependent portions of the mesentery in the upper pelvis. No new rim enhancing fluid collections along the peritoneum. Vascular/Lymphatic: Normal caliber aorta and IVC. Scattered partially calcified plaque along the aorta and iliac vessels. No specific abnormal lymph node enlargement seen in the abdomen or pelvis. Reproductive: Ill-defined margins of the prostate with the adjacent inflammatory process. Other: None Musculoskeletal: Scattered degenerative changes along the spine with some disc bulging particularly at L4-5 and L5-S1. Degenerative changes along the pelvis. Small sclerotic area along the left femoral head greater than right is again noted and may represent bone islands. IMPRESSION: Interval removal of the pigtail catheter in the presacral space. The fluid collection this location is similar in size to previous but the amount of fluid in the collection is increased compared to previous. Please correlate for abscess. There is adjacent extensive presacral fat stranding. Small amount of ill-defined fluid in the dependent mesentery of the upper pelvis and lower abdomen without additional  rim enhancing fluid collections. Progression of size of multiple liver metastases. Stable lingular nodule. Persistent soft tissue nodule in the low pelvis just to the left anterior to the fluid collection in the presacral space and posterior bladder. This could represent an area of neoplasm. Increasing wall thickening along loops of small bowel in the central pelvis.  This could be infectious or inflammatory. Please correlate for any history of pelvic radiation. Once again there is a large peristomal hernia along the left side adjacent to the colostomy involving mesentery fat and small bowel without obstruction. This is a wide mouth hernia. Persistent wall thickening of the urinary bladder. There is ill-defined margins of the prostate with some soft tissue thickening and stranding. Electronically Signed   By: Jill Side M.D.   On: 06/24/2022 12:17    Procedures Procedures    Medications Ordered in ED Medications  iohexol (OMNIPAQUE) 300 MG/ML solution 100 mL (100 mLs Intravenous Contrast Given 06/24/22 1132)    ED Course/ Medical Decision Making/ A&P Clinical Course as of 06/24/22 1532  Wed Jun 24, 2022  1258 CT scan shows persistent fluid collection in the previous sacral space.  There is also presacral fat stranding.  Patient also has evidence of increasing size of liver metastases.  Patient also has increasing wall thickening of loops of small bowel.  Persistent large peristomal hernia without signs of obstruction [JK]  1403 Reviewed  case with General surgery. Will evaluate pt in the ED. [NU]  2725 Reviewed with general surgery, no need for emergent surgery or admission.  Will add some additional information regarding his bowel regimen. [DG]  6440 Labs reviewed.  No leukocytosis.  No electrolyte abnormalities.  UA without signs of infection [JK]    Clinical Course User Index [JK] Dorie Rank, MD                             Medical Decision Making Problems Addressed: Generalized abdominal  pain: acute illness or injury Metastatic malignant neoplasm, unspecified site [facility]): chronic illness or injury Parastomal hernia without obstruction or gangrene: chronic illness or injury  Amount and/or Complexity of Data Reviewed External Data Reviewed: labs, radiology and notes.    Details: I reviewed the patient's recent CT scans.  Patient did have a CT scan in November of this year that showed return of hepatic metastases.  Patient states he was not aware that he had recurrent liver mets.  Oncology notes reviewed from December 26 visit.  Notes indicate patient does have metastatic disease and his treatments are currently only palliative and not curative. Labs: ordered. Decision-making details documented in ED Course. Radiology: ordered and independent interpretation performed.   Patient presented to ED for evaluation of persistent abdominal pain.  She has complicated history of metastatic adenocarcinoma.  He also has a colostomy with a hernia.  Patient without signs of active infection at this time.  He does have a fluid collection still noted on CT scan but is not having any fevers.  No leukocytosis.  Patient was seen by general surgery.  They do not feel that he requires any emergent surgical treatment at this time.  Will add a bowel regimen as he does describe some constipation type symptoms.  Patient's CT scan does show increasing size of his liver lesions.  Recommend outpatient follow-up with his oncologist to discuss further treatment.        Final Clinical Impression(s) / ED Diagnoses Final diagnoses:  Generalized abdominal pain  Metastatic malignant neoplasm, unspecified site ([place])  Parastomal hernia without obstruction or gangrene    Rx / DC Orders ED Discharge Orders          Ordered    polyethylene glycol (MIRALAX) 17 g packet  Daily        06/24/22 1529  Dorie Rank, MD 06/24/22 [phone]

## 2022-06-24 NOTE — ED Triage Notes (Signed)
Pt reports abdominal pain x 3 weeks states pcp was supposed to order CT scan but has not heard from them. Pt has colostomy bag and hernia. Hx stage IV rectal cancer.

## 2022-06-24 NOTE — Consult Note (Signed)
Consult Note  James Coffey [birth year]  [id number].    Requesting MD: Dr. Tomi Bamberger Chief Complaint/Reason for Consult: abdominal pain  HPI:  52 y.o. male known to our service with complex medical history significant for diverting colostomy in setting of necrotizing fasciitis s/p debridements, rectal cancer with liver mets s/p partial left hepatectomy in March 2023 by Dr. [name]. He then underwent percutaneous ablation of a right liver tumor in early May as well as abdominal perineal resection, robotic approach and February 04, 2022 by Dr. [name]. He completed radiation therapy June 2023. He who presented to [facility] ED with abdominal pain and decreased colostomy output as well as desire for CT scan for further evaluation of his parastomal hernia. He last saw Dr. 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  He tells me he has and intermittent crampy abdominal pain associated with nausea and vomiting. Symptoms occur every 2-3 days and he cannot identify precipitating factors. He does not resolution of symptoms after passage of hard stool via colostomy. Output is usually soft and he empties his bag 2-3 times at baseline. He has not emptied his bag in 2 days. Last episode of vomiting was 2 days ago. He has been drinking liquids and meals easier to digest such as soups. He is not currently nauseas. Abdominal pain is mild. He states he has not had any problems with his wound.  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   Cancer ([place])    rectal cancer with mets to liver   Tobacco abuse     Past Surgical History:  Procedure Laterality Date   HERNIA  REPAIR     INCISION AND DRAINAGE ABSCESS N/A 12/01/2020   Procedure: INCISION AND DRAINAGE ABSCESS;  Surgeon: Michael Boston, MD;  Location: [facility];  Service: General;  Laterality: N/A;   INCISION AND DRAINAGE ABSCESS N/A 12/04/2020   Procedure: INCISION AND DRAINAGE FOURNIERS GANGRENE;  Surgeon: [name], MD;  Location: [facility];  Service: General;  Laterality: N/A;   INCISION AND DRAINAGE PERIRECTAL ABSCESS N/A 11/30/2020   Procedure: INCISION AND DEBRIDEMENT OF PERIRECTAL ABSCESS AND RECTAL WALL BIOPSIES;  Surgeon: Michael Boston, MD;  Location: [facility];  Service: General;  Laterality: N/A;   INGUINAL HERNIA REPAIR Right 2007   IR IMAGING GUIDED PORT INSERTION  05/07/2021   IR RADIOLOGIST EVAL & MGMT  08/27/2021   IR RADIOLOGIST EVAL & MGMT  09/12/2021   IR RADIOLOGIST EVAL & MGMT  11/10/2021   IR RADIOLOGIST EVAL & MGMT  01/05/2022   IR SINUS/FIST TUBE CHK-NON GI  04/28/2022   LAPAROSCOPIC LOOP COLOSTOMY N/A 12/13/2020   Procedure: DIAGNOSTIC LAPAROSCOPY WITH  OPEN DESCENDING LOOP COLOSTOMY;  Surgeon: Armandina Gemma, MD;  Location: [facility];  Service: General;  Laterality: N/A;   LAPAROSCOPY N/A 08/28/2021   Procedure: LAPAROSCOPY DIAGNOSTIC;  Surgeon: Dwan Bolt, MD;  Location: [place];  Service: General;  Laterality: N/A;   OPEN PARTIAL HEPATECTOMY  Left 08/28/2021   Procedure: OPEN LEFT LATERAL HEPATECTOMY WITH INTRAOPERATIVE ULTRASOUND;  Surgeon: Dwan Bolt, MD;  Location: [place];  Service: General;  Laterality: Left;   RADIOLOGY WITH ANESTHESIA N/A 10/08/2021   Procedure: MICROWAVE ABLATION;  Surgeon: Aletta Edouard, MD;  Location: [facility];  Service: Radiology;  Laterality: N/A;   RECTAL SURGERY     XI ROBOTIC ASSISTED PARASTOMAL HERNIA REPAIR N/A 02/04/2022   Procedure: ROBOTIC ABDOMINAL PERINEAL RESECTION, PARASTOMAL HERNIA REPAIR, BILATERAL TAP BLOCK;  Surgeon: [name], MD;  Location: [facility];  Service: General;  Laterality: N/A;    Social History:  reports that he has been  smoking cigarettes. He has been smoking an average of .25 packs per day. He has never used smokeless tobacco. He reports that he does not drink alcohol and does not use drugs.  Allergies:  Allergies  Allergen Reactions   Hydrocodone Itching    (Not in a hospital admission)   Blood pressure 98/75, pulse 75, temperature 98.7 F (37.1 C), temperature source Oral, resp. rate 17, height '5\' 9"'$  (1.753 m), weight 79.4 kg, SpO2 98 %. Physical Exam: General: pleasant, WD, male who is laying in bed in NAD HEENT: head is normocephalic, atraumatic.  Sclera are noninjected.  Pupils equal and round. EOMs intact.  Ears and nose without any masses or lesions.  Mouth is pink and moist Heart: regular, rate, and rhythm.  Normal s1,s2. No obvious murmurs, gallops, or rubs noted.  Palpable radial and pedal pulses bilaterally Lungs: CTAB, no wheezes, rhonchi, or rales noted.  Respiratory effort nonlabored Abd: soft, mild distension. Mild TTP epigastrium and around colostomy overall parastomal hernia which is large but soft. Stoma is pink and viable. Small amount of liquid brown stool in bag MSK: all 4 extremities are symmetrical with no cyanosis, clubbing, or edema. Skin: warm and dry with no masses, lesions, or rashes Neuro: Cranial nerves 2-12 grossly intact, sensation is normal throughout Psych: A&Ox3 with an appropriate affect.    Results for orders placed or performed during the hospital encounter of 06/24/22 (from the past 48 hour(s))  CBC with Differential     Status: Abnormal   Collection Time: 06/24/22  9:55 AM  Result Value Ref Range   WBC 6.2 4.0 - 10.5 K/uL   RBC 4.47 4.22 - 5.81 MIL/uL   Hemoglobin 12.3 (L) 13.0 - 17.0 g/dL   HCT 39.2 39.0 - 52.0 %   MCV 87.7 80.0 - 100.0 fL   MCH 27.5 26.0 - 34.0 pg   MCHC 31.4 30.0 - 36.0 g/dL   RDW 14.7 11.5 - 15.5 %   Platelets 308 150 - 400 K/uL   nRBC 0.0 0.0 - 0.2 %   Neutrophils Relative % 68 %   Neutro Abs 4.3 1.7 - 7.7 K/uL   Lymphocytes  Relative 18 %   Lymphs Abs 1.1 0.7 - 4.0 K/uL   Monocytes Relative 11 %   Monocytes Absolute 0.7 0.1 - 1.0 K/uL   Eosinophils Relative 1 %   Eosinophils Absolute 0.1 0.0 - 0.5 K/uL   Basophils Relative 1 %   Basophils Absolute 0.0 0.0 - 0.1 K/uL   Immature Granulocytes 1 %   Abs Immature Granulocytes 0.04 0.00 - 0.07 K/uL    Comment: Performed at [facility], [place] [address], [place], [place] [address]  Comprehensive metabolic panel     Status: Abnormal   Collection Time: 06/24/22  9:55 AM  Result Value Ref Range   Sodium 134 (L) 135 - 145 mmol/L   Potassium 3.8 3.5 - 5.1 mmol/L   Chloride 102 98 - 111 mmol/L   CO2  24 22 - 32 mmol/L   Glucose, Bld 139 (H) 70 - 99 mg/dL    Comment: Glucose reference range applies only to samples taken after fasting for at least 8 hours.   BUN 8 6 - 20 mg/dL   Creatinine, Ser 0.83 0.61 - 1.24 mg/dL   Calcium 8.8 (L) 8.9 - 10.3 mg/dL   Total Protein 7.5 6.5 - 8.1 g/dL   Albumin 3.4 (L) 3.5 - 5.0 g/dL   AST 16 15 - 41 U/L   ALT 23 0 - 44 U/L   Alkaline Phosphatase 108 38 - 126 U/L   Total Bilirubin 0.8 0.3 - 1.2 mg/dL   GFR, Estimated >60 >60 mL/min    Comment: (NOTE) Calculated using the CKD-EPI Creatinine Equation (2021)    Anion gap 8 5 - 15    Comment: Performed at [facility], [place] [address] [place], [place] [address]  Lipase, blood     Status: None   Collection Time: 06/24/22  9:55 AM  Result Value Ref Range   Lipase 26 11 - 51 U/L    Comment: Performed at [facility], [place] [address], [place], [place] [address]  Urinalysis, Routine w reflex microscopic     Status: None   Collection Time: 06/24/22  9:55 AM  Result Value Ref Range   Color, Urine YELLOW YELLOW   APPearance CLEAR CLEAR   Specific Gravity, Urine 1.019 1.005 - 1.030   pH 5.0 5.0 - 8.0   Glucose, UA NEGATIVE NEGATIVE mg/dL   Hgb urine dipstick NEGATIVE NEGATIVE   Bilirubin Urine NEGATIVE NEGATIVE   Ketones, ur  NEGATIVE NEGATIVE mg/dL   Protein, ur NEGATIVE NEGATIVE mg/dL   Nitrite NEGATIVE NEGATIVE   Leukocytes,Ua NEGATIVE NEGATIVE    Comment: Performed at [facility] [address], [place], [place] [address]   CT Abdomen Pelvis W Contrast  Result Date: 06/24/2022 CLINICAL DATA:  Abdominal pain for 3 weeks. Metastatic rectal cancer. Previous abscess EXAM: CT ABDOMEN AND PELVIS WITH CONTRAST TECHNIQUE: Multidetector CT imaging of the abdomen and pelvis was performed using the standard protocol following bolus administration of intravenous contrast. RADIATION DOSE REDUCTION: This exam was performed according to the departmental dose-optimization program which includes automated exposure control, adjustment of the mA and/or kV according to patient size and/or use of iterative reconstruction technique. CONTRAST:  154m OMNIPAQUE IOHEXOL 300 MG/ML  SOLN COMPARISON:  CT 04/28/2022 and older FINDINGS: Lower chest: Breathing motion at the lung bases. There is some linear opacity seen likely scar or atelectasis. No pleural effusion. There is a mass in the lingula on series 9, image 18 this measures 2.5 by 1.9 cm and previously when measured in the same fashion as today 2.6 by 1.9 cm. Coronary artery calcifications are seen. Please correlate for other coronary risk factors. Hepatobiliary: Liver metastases are identified. At least 3 large lesions are identified. Dome lesion near the margin of the IVC and hepatic vein centrally on the previous examination measured 3.5 x 2.9 cm and today is larger at 6.6 by 5.8 cm. The peripheral lesion at the same level on the previous exam in segment 4 measured 2.7 x 2.1 cm and today on image 15 measures 5.0 by 3.3 cm. Lesion seen more caudal along segment 5 is similar. There is some tiny foci seen in segment 3 as well. Underlying fatty liver infiltration. Gallbladder is nondilated. Patent portal vein. Pancreas: Moderate atrophy of the pancreas without mass lesion is  similar to previous.  Spleen: Spleen is nonenlarged.  Preserved enhancement. Adrenals/Urinary Tract: The adrenal glands are preserved. No enhancing renal mass. There is a punctate low-density focus medially in the upper pole left kidney measuring 3-4 mm, too small to completely characterize although likely small cysts. Focus in the upper pole of the right kidney medially is also seen and stable from prior measuring maximal diameter of 2.1 cm and Hounsfield unit of 2 consistent with a Bosniak 1 cyst. No specific imaging follow-up recommended. No collecting system dilatation. The ureters have a normal course and caliber down to the bladder. Bladder has a preserved contour but has diffuse mild wall thickening. Stomach/Bowel: Stomach is mildly distended with fluid. The duodenal mass normal course and caliber as does the rest of the small bowel. The exception is a large left-sided anterior abdominal wall hernia along the margin of the rectus abdominis muscle. Again this is a large hernia with herniation of mesenteric fat, small bowel parastomal hernia due to the associated colostomy location. The colon proximal to this is nondilated with scattered colonic stool. There are some loops of small bowel in the central pelvis above the bladder which have wall thickening. There is some mesenteric stranding. Please correlate for infectious or inflammatory process. Please correlate for any history of radiation. The wall thickening is increased from previous. Previously there was a pigtail catheter in the fluid collection along the surgical bed along the rectum in the presacral space with mass effect along the prostate in the posterior wall of the bladder. There is some residual gas and fluid in this location today measuring 4.0 by 2.0 cm on series 2, image 87. This is slightly larger. The amount fluid is increased relative to air. The pigtail catheter is no longer seen. Please correlate for history of abscess. Just to the left this  is a small area of nodular tissue on series 4, image 16 measuring 19 by 17 mm which is unchanged from previous. There is some simple fluid along the dependent portions of the mesentery in the upper pelvis. No new rim enhancing fluid collections along the peritoneum. Vascular/Lymphatic: Normal caliber aorta and IVC. Scattered partially calcified plaque along the aorta and iliac vessels. No specific abnormal lymph node enlargement seen in the abdomen or pelvis. Reproductive: Ill-defined margins of the prostate with the adjacent inflammatory process. Other: None Musculoskeletal: Scattered degenerative changes along the spine with some disc bulging particularly at L4-5 and L5-S1. Degenerative changes along the pelvis. Small sclerotic area along the left femoral head greater than right is again noted and may represent bone islands. IMPRESSION: Interval removal of the pigtail catheter in the presacral space. The fluid collection this location is similar in size to previous but the amount of fluid in the collection is increased compared to previous. Please correlate for abscess. There is adjacent extensive presacral fat stranding. Small amount of ill-defined fluid in the dependent mesentery of the upper pelvis and lower abdomen without additional rim enhancing fluid collections. Progression of size of multiple liver metastases. Stable lingular nodule. Persistent soft tissue nodule in the low pelvis just to the left anterior to the fluid collection in the presacral space and posterior bladder. This could represent an area of neoplasm. Increasing wall thickening along loops of small bowel in the central pelvis. This could be infectious or inflammatory. Please correlate for any history of pelvic radiation. Once again there is a large peristomal hernia along the left side adjacent to the colostomy involving mesentery fat and small bowel without obstruction. This is  a wide mouth hernia. Persistent wall thickening of the urinary  bladder. There is ill-defined margins of the prostate with some soft tissue thickening and stranding. Electronically Signed   By: Jill Side M.D.   On: 06/24/2022 12:17      Assessment/Plan S/p abdominal perineal resection, robotic approach and February 04, 2022 by Dr. [name]. Non obstructing parastomal hernia Presacral fluid collection  Patient seen and examined and relevant labs and imaging reviewed by myself and by Dr. Rosendo Gros with whom the care plan was discussed.  Patient is afebrile with normal vital signs and WBC.  He is not ill-appearing.  Presacral fluid collection is not unexpected in setting of his APR and no further treatment necessary at this time.  In regard to his parastomal hernia, CT scan shows this involves mesentery fat and small bowel without obstruction which is consistent with history and exam.  He does have some nausea, vomiting and has had decreased colostomy output.  He has not been on a bowel regimen.  Recommend bowel regimen with senna-docusate which he has been prescribed previously with the addition of MiraLAX 1-2 times a day to improve bowel function.  Recommend good hydration.  Follow-up as scheduled with Dr. [name] in a couple weeks.  Return instructions given for signs/symptoms of infection or obstruction.   Winferd Humphrey, [facility] 06/24/2022, 3:30 PM Please see Amion for pager number during day hours 7:00am-4:30pm

## 2022-06-24 NOTE — ED Provider Triage Note (Signed)
Emergency Medicine Provider Triage Evaluation Note  James Coffey , a 52 y.o. male  was evaluated in triage.  Pt complains of left-sided abdominal pain.  Has been present for the past 3 weeks.  Has a history of abdominal tumor.  States he called his surgeon when symptoms began and was told that he should have a CT of his abdomen.  He has not heard anything since that time.  He does not have a primary care provider but follows with oncology.  States he has not been able to get an appointment with them.  Pain is intermittent..  States the left side of his abdomen has gotten larger over the past 3 weeks  Review of Systems  Positive: As above Negative: As above  Physical Exam  BP 114/84 (BP Location: Left Arm)   Pulse 100   Temp 98.3 F (36.8 C) (Oral)   Resp 18   Ht '5\' 9"'$  (1.753 m)   Wt 79.4 kg   SpO2 100%   BMI 25.84 kg/m  Gen:   Awake, no distress   Resp:  Normal effort  MSK:   Moves extremities without difficulty  Other:  Ostomy bag in The left lower quadrant.  Significant enlargement of the left lower quadrant  Medical Decision Making  Medically screening exam initiated at 9:47 AM.  Appropriate orders placed.  [name] was informed that the remainder of the evaluation will be completed by another provider, this initial triage assessment does not replace that evaluation, and the importance of remaining in the ED until their evaluation is complete.     Roylene Reason, [place] 06/24/22 [phone]

## 2022-06-24 NOTE — Discharge Instructions (Addendum)
Follow up with Dr. [name] as scheduled. Follow bowel regimen as prescribed. Call [facility] office or return to the ED for worsening abdominal pain especially if associated with fever (temperature greater than 100.76F), increasing nausea/vomiting, or unable to eat or drink. Stay hydrated with plenty of water. Take senna-docusate daily as prescribed and as well as miralax at least once a day to maintain regular soft colostomy output.

## 2022-07-03 ENCOUNTER — Inpatient Hospital Stay (HOSPITAL_BASED_OUTPATIENT_CLINIC_OR_DEPARTMENT_OTHER): Payer: Medicaid Other | Admitting: Hematology

## 2022-07-03 VITALS — BP 116/67 | HR 99 | Temp 97.5°F | Resp 20 | Wt 169.6 lb

## 2022-07-03 DIAGNOSIS — G4709 Other insomnia: Secondary | ICD-10-CM | POA: Diagnosis not present

## 2022-07-03 DIAGNOSIS — C20 Malignant neoplasm of rectum: Secondary | ICD-10-CM | POA: Diagnosis not present

## 2022-07-03 DIAGNOSIS — Z7189 Other specified counseling: Secondary | ICD-10-CM | POA: Diagnosis not present

## 2022-07-03 DIAGNOSIS — C787 Secondary malignant neoplasm of liver and intrahepatic bile duct: Secondary | ICD-10-CM | POA: Diagnosis not present

## 2022-07-03 NOTE — Progress Notes (Signed)
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[place])  12/17/2020 Initial Diagnosis   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[place])   01/13/2021 Cancer Staging   Staging form: Colon and Rectum, AJCC 8th Edition - Clinical stage from 01/13/2021: Stage IIIB (cT3, cN2a, cM0) - Signed by [name], MD on 01/19/2021 Stage prefix: Initial diagnosis Histologic grade (G): G2 Histologic grading system: 4 grade system   01/20/2021 - 03/26/2021 Chemotherapy   Patient is on Treatment Plan : RECTAL Xelox ([place]) q21d x 6 cycles     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   INTERVAL HISTORY:   James Coffey is a 52 y.o. male is here for continued valuation and management of oligometastatic rectal cancer. He previously underwent robotic abdominal perineal resection, parastomal hernia repair by Dr. [name] on 4/58/0998 .  He subsequently mated to the hospital in October 2023 with postoperative  abscess.  His perineal wound is healing by secondary intention and he continues to follow with Dr. [name] regarding this. His CT of the abdomen and pelvis on 04/28/2022 showed decrease in his pelvic abscess but enlarging left lower lobe pulmonary nodule.  He is following with radiation oncology for SBRT of this.  There was also concern for hepatic lesions which were thought to be possibly new at the time.  Patient was last seen by me on 06/02/2022 and was significantly distressed due to decrease in size of his penis and difficulty with sexual function. He also complained of constipation and harder stools.  He recently presented to the ED on 06/24/22 for abdominal pain and distention. He stated that he had been having pain in spasm in his abdomen. Pt stated he has noticed some hard stool from the colostomy bag. He received a CT scan, which showed increasing size of his liver lesions.   Today, he is accompanied by an interpretor. His daughter is on call. He reports that his abdominal pain has improved but occasionally continues to occur. His open wound is healing. No other abdominal pain. He has bowel movements every 2-3 days. He is compliantly taking Miralax and medication to increase his appetite. He takes Lorazepam for nausea. He will be seen by his PCP on Jan 30. His symptoms have improved following his medication intake. No fevers or chills. No back pain.  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[place]) 03/21/2022   Cancer ([place])    rectal cancer with mets to liver   Tobacco abuse     SURGICAL HISTORY: Past Surgical History:  Procedure Laterality Date   HERNIA REPAIR  INCISION AND DRAINAGE ABSCESS N/A 12/01/2020   Procedure: INCISION AND DRAINAGE ABSCESS;  Surgeon: Michael Boston, MD;  Location: [facility];  Service: General;  Laterality: N/A;   INCISION AND DRAINAGE ABSCESS N/A 12/04/2020   Procedure: INCISION AND DRAINAGE FOURNIERS GANGRENE;   Surgeon: [name], MD;  Location: [facility];  Service: General;  Laterality: N/A;   INCISION AND DRAINAGE PERIRECTAL ABSCESS N/A 11/30/2020   Procedure: INCISION AND DEBRIDEMENT OF PERIRECTAL ABSCESS AND RECTAL WALL BIOPSIES;  Surgeon: Michael Boston, MD;  Location: [facility];  Service: General;  Laterality: N/A;   INGUINAL HERNIA REPAIR Right 2007   IR IMAGING GUIDED PORT INSERTION  05/07/2021   IR RADIOLOGIST EVAL & MGMT  08/27/2021   IR RADIOLOGIST EVAL & MGMT  09/12/2021   IR RADIOLOGIST EVAL & MGMT  11/10/2021   IR RADIOLOGIST EVAL & MGMT  01/05/2022   IR SINUS/FIST TUBE CHK-NON GI  04/28/2022   LAPAROSCOPIC LOOP COLOSTOMY N/A 12/13/2020   Procedure: DIAGNOSTIC LAPAROSCOPY WITH  OPEN DESCENDING LOOP COLOSTOMY;  Surgeon: Armandina Gemma, MD;  Location: [facility];  Service: General;  Laterality: N/A;   LAPAROSCOPY N/A 08/28/2021   Procedure: LAPAROSCOPY DIAGNOSTIC;  Surgeon: Dwan Bolt, MD;  Location: [facility];  Service: General;  Laterality: N/A;   OPEN PARTIAL HEPATECTOMY  Left 08/28/2021   Procedure: OPEN LEFT LATERAL HEPATECTOMY WITH INTRAOPERATIVE ULTRASOUND;  Surgeon: Dwan Bolt, MD;  Location: [facility];  Service: General;  Laterality: Left;   RADIOLOGY WITH ANESTHESIA N/A 10/08/2021   Procedure: MICROWAVE ABLATION;  Surgeon: Aletta Edouard, MD;  Location: [facility];  Service: Radiology;  Laterality: N/A;   RECTAL SURGERY     XI ROBOTIC ASSISTED PARASTOMAL HERNIA REPAIR N/A 02/04/2022   Procedure: ROBOTIC ABDOMINAL PERINEAL RESECTION, PARASTOMAL HERNIA REPAIR, BILATERAL TAP BLOCK;  Surgeon: [name], MD;  Location: [facility];  Service: General;  Laterality: N/A;    ALLERGIES:  is allergic to hydrocodone.  ROS  10 Point review of Systems was done is negative except as noted above.   MEDICATIONS:  Current Outpatient Medications  Medication Sig Dispense Refill   acetaminophen (TYLENOL) 500 MG tablet Take 2 tablets (1,000 mg total) by mouth every 8 (eight) hours as needed for mild pain or  fever.     amoxicillin-clavulanate (AUGMENTIN) 875-125 MG tablet Take 1 tablet by mouth 2 (two) times daily. 14 tablet 0   LORazepam (ATIVAN) 0.5 MG tablet Take 1 tablet (0.5 mg total) by mouth at bedtime. 30 tablet 0   oxyCODONE (OXY IR/ROXICODONE) 5 MG immediate release tablet Take 1 tablet (5 mg total) by mouth every 6 (six) hours as needed for moderate pain or severe pain. 20 tablet 0   polyethylene glycol (MIRALAX) 17 g packet Take 17 g by mouth daily. Take daily to maintain regular soft colostomy output. Can increase to twice a day if need. 14 each 0   prochlorperazine (COMPAZINE) 10 MG tablet Take 1 tablet (10 mg total) by mouth every 6 (six) hours as needed for nausea or vomiting. 30 tablet 0   senna-docusate (SENNA S) 8.6-50 MG tablet Take 2 tablets by mouth at bedtime. 60 tablet 1   zolpidem (AMBIEN) 10 MG tablet Take 1 tablet (10 mg total) by mouth at bedtime as needed for sleep. 30 tablet 1   No current facility-administered medications for this visit.   Facility-Administered Medications Ordered in Other Visits  Medication Dose Route Frequency Provider Last Rate Last Admin   palonosetron (ALOXI) 0.25 MG/5ML injection  sodium chloride flush (NS) 0.9 % injection 10 mL  10 mL Intracatheter Once [name], MD        PHYSICAL EXAMINATION: ECOG PERFORMANCE STATUS: 1 - Symptomatic but completely ambulatory  Vital signs reviewed Wt Readings from Last 3 Encounters:  06/24/22 175 lb (79.4 kg)  06/02/22 175 lb 4.8 oz (79.5 kg)  04/29/22 170 lb 12.8 oz (77.5 kg)    GENERAL:alert, in no acute distress and comfortable SKIN: no acute rashes, no significant lesions EYES: conjunctiva are pink and non-injected, sclera anicteric OROPHARYNX: MMM, no exudates, no oropharyngeal erythema or ulceration NECK: supple, no JVD LYMPH:  no palpable lymphadenopathy in the cervical, axillary or inguinal regions LUNGS: clear to auscultation b/l with normal respiratory effort HEART:  regular rate & rhythm ABDOMEN:  normoactive bowel sounds , non tender, not distended. Extremity: no pedal edema PSYCH: alert & oriented x 3 with fluent speech NEURO: no focal motor/sensory deficits   LABORATORY DATA:  .    Latest Ref Rng & Units 06/24/2022    9:55 AM 06/16/2022   11:31 AM 06/02/2022   12:25 PM  CBC  WBC 4.0 - 10.5 K/uL 6.2  7.7  7.8   Hemoglobin 13.0 - 17.0 g/dL 12.3  11.7  12.3   Hematocrit 39.0 - 52.0 % 39.2  35.7  36.5   Platelets 150 - 400 K/uL 308  262  261    .    Latest Ref Rng & Units 06/24/2022    9:55 AM 06/16/2022   11:31 AM 06/02/2022   12:25 PM  CMP  Glucose 70 - 99 mg/dL 139  146  170   BUN 6 - 20 mg/dL '8  10  12   '$ Creatinine 0.61 - 1.24 mg/dL 0.83  0.64  0.68   Sodium 135 - 145 mmol/L 134  138  138   Potassium 3.5 - 5.1 mmol/L 3.8  3.6  4.0   Chloride 98 - 111 mmol/L 102  106  105   CO2 22 - 32 mmol/L '24  25  27   '$ Calcium 8.9 - 10.3 mg/dL 8.8  9.2  9.5   Total Protein 6.5 - 8.1 g/dL 7.5  7.3  7.2   Total Bilirubin 0.3 - 1.2 mg/dL 0.8  0.7  0.5   Alkaline Phos 38 - 126 U/L 108  109  117   AST 15 - 41 U/L '16  9  11   '$ ALT 0 - 44 U/L '23  8  9    '$ Lab Results  Component Value Date   FERRITIN 66 06/02/2022    SURGICAL PATHOLOGY   THIS IS AN ADDENDUM REPORT   CASE: [id number]  PATIENT: [facility]  Surgical Pathology Report  Addendum    Reason for Addendum #1:  DNA Mismatch Repair IHC Results   Clinical History: Fournier's Gangrene; Cancer ? ([facility])      FINAL MICROSCOPIC DIAGNOSIS:   A. IRRIGATION AND DEBRIDEMENT OF SCROTUM AND PENIS:  Acute necrotizing inflammation extensively involving fascia    B. Rectal wall biopsies:  Adenocarcinoma of the rectum        RADIOGRAPHIC STUDIES: I have personally reviewed the radiological images as listed and agreed with the findings in the report. No results found.   ASSESSMENT & PLAN:   James Coffey is a 52 y.o. male with   1.  Oligometastatic rectal cancer with 2 visible liver  metastases solitary lesion in the right upper lobe of the lung.-newly noted on CT chest abdomen pelvis.  KRAS mutated MMR IHC normal  2.  Initially diagnosed locally Advanced Rectal Adenocarcinoma, c(T3c, N2), mismatch repair protein normal -presented to ED on 11/29/20 with septic shock secondary to Fournier's gangrene -CT AP showed findings suspicious for necrotizing perineal soft tissue infection with irregular presacral nodule. Rectal biopsies obtained on 11/30/20 confirmed rectal adenocarcinoma. -Baseline CEA on 12/09/20 was elevated at 16.7. -He underwent diagnostic laparoscopy and open descending loop colostomy on 12/13/20 with Dr. Harlow Asa. -Chest CT on 12/14/20 was negative for metastatic disease. -Repeat CEA on 01/06/21 showed further elevation to 52.55. -staging pelvis MRI on 01/12/21 showed: rectal adenocarcinoma T stage: At least T3c, possible involvement of prostatic apex; N stage: N2; long segment rectal tumor, involving anal sphincters. -s/p neoadjuvant FOLFOX chemotherapy S/p resection of liver met and thermal ablation of 2nd liver met Currently completing Chemo radiation of rectal mass Plan for SBRT of isolated lung lesion.  3.  Iron deficiency-currently anemia has resolved and ferritin is reasonable Lab Results  Component Value Date   FERRITIN 66 06/02/2022   4 Insomnia -lorazepam prn  5. Recurrent PERIANAL ABSCESS.  Patient hospitalized in October for pelvic abscess status post abdominal perineal resection.  6.  Multifactorial sexual dysfunction  PLAN: -Discussed concerning growth of 3 spots on liver  which are obviously larger in size. One of which has increased from 3.5 cm to 6 cm, another from 2.7 cm to 5 cm. Concerning for progression of other spots that have grown -Results showed fluid in abdomin but no signs of infection -order chest CT scan. If no new obvious findings, refer to Interventional radiologist for liver directed treatment. -if broader progression, need to  discuss further treatment to proceed -if only progression in liver, discuss with interventional radiology of chemotherapy directly into the liver -educated pt that other treatment options would need to be considered if disease progresses to other areas. -discussed that this disease is not curative despite treatments -answered all of patient's and his daughter's questions regarding treatment options -refill Lorazepam for nausea -Discussed option of referral to [facility] to hear an additional opinion - patient declines this currently.  Follow-up -CT chest wo contrast in 2-3 days -Referral to Dr Kathlene Cote for consideration of TACE for recurrent liver lesions from metastatic rectal carcinoma -Phone visit with Dr Irene Limbo in 10 days  The total time spent in the appointment was 32 minutes* .  All of the patient's questions were answered with apparent satisfaction. The patient knows to call the clinic with any problems, questions or concerns.   Sullivan Lone MD MS AAHIVMS [facility] Hematology/Oncology Physician [facility]  .*Total Encounter Time as defined by the Centers for Medicare and Medicaid Services includes, in addition to the face-to-face time of a patient visit (documented in the note above) non-face-to-face time: obtaining and reviewing outside history, ordering and reviewing medications, tests or procedures, care coordination (communications with other health care professionals or caregivers) and documentation in the medical record.   I,Mitra Faeizi,acting as a Education administrator for Sullivan Lone, MD.,have documented all relevant documentation on the behalf of Sullivan Lone, MD,as directed by  Sullivan Lone, MD while in the presence of Sullivan Lone, MD.   .I have reviewed the above documentation for accuracy and completeness, and I agree with the above. [name] MD

## 2022-07-09 ENCOUNTER — Other Ambulatory Visit: Payer: Self-pay | Admitting: Interventional Radiology

## 2022-07-09 ENCOUNTER — Other Ambulatory Visit: Payer: Self-pay | Admitting: Hematology

## 2022-07-09 DIAGNOSIS — C787 Secondary malignant neoplasm of liver and intrahepatic bile duct: Secondary | ICD-10-CM

## 2022-07-09 DIAGNOSIS — C19 Malignant neoplasm of rectosigmoid junction: Secondary | ICD-10-CM

## 2022-07-09 DIAGNOSIS — G4709 Other insomnia: Secondary | ICD-10-CM

## 2022-07-10 ENCOUNTER — Other Ambulatory Visit: Payer: Self-pay

## 2022-07-10 ENCOUNTER — Encounter: Payer: Self-pay | Admitting: Hematology

## 2022-07-10 ENCOUNTER — Other Ambulatory Visit (HOSPITAL_COMMUNITY): Payer: Self-pay

## 2022-07-10 DIAGNOSIS — C20 Malignant neoplasm of rectum: Secondary | ICD-10-CM

## 2022-07-10 MED ORDER — OXYCODONE HCL 5 MG PO TABS
5.0000 mg | ORAL_TABLET | Freq: Four times a day (QID) | ORAL | 0 refills | Status: DC | PRN
  Filled 2022-07-10: qty 20, 5d supply, fill #0

## 2022-07-10 MED ORDER — LORAZEPAM 0.5 MG PO TABS
0.5000 mg | ORAL_TABLET | Freq: Every evening | ORAL | 0 refills | Status: DC
  Filled 2022-07-10: qty 30, 30d supply, fill #0

## 2022-07-10 MED ORDER — SENNOSIDES-DOCUSATE SODIUM 8.6-50 MG PO TABS
2.0000 | ORAL_TABLET | Freq: Every day | ORAL | 1 refills | Status: DC
  Filled 2022-07-10: qty 60, 30d supply, fill #0

## 2022-07-10 MED ORDER — PROCHLORPERAZINE MALEATE 10 MG PO TABS
10.0000 mg | ORAL_TABLET | Freq: Four times a day (QID) | ORAL | 0 refills | Status: DC | PRN
  Filled 2022-07-10: qty 30, 8d supply, fill #0

## 2022-07-13 ENCOUNTER — Ambulatory Visit (HOSPITAL_COMMUNITY)
Admission: RE | Admit: 2022-07-13 | Discharge: 2022-07-13 | Disposition: A | Discharge status: Home / Self Care | Payer: Medicaid Other | Source: Ambulatory Visit | Attending: Hematology | Admitting: Hematology

## 2022-07-13 DIAGNOSIS — C787 Secondary malignant neoplasm of liver and intrahepatic bile duct: Secondary | ICD-10-CM | POA: Diagnosis present

## 2022-07-13 DIAGNOSIS — C20 Malignant neoplasm of rectum: Secondary | ICD-10-CM | POA: Diagnosis present

## 2022-07-13 MED ORDER — IOHEXOL 9 MG/ML PO SOLN
ORAL | Status: AC
  Filled 2022-07-13: qty 1000

## 2022-07-13 MED ORDER — SODIUM CHLORIDE (PF) 0.9 % IJ SOLN
INTRAMUSCULAR | Status: AC
  Filled 2022-07-13: qty 50

## 2022-07-13 MED ORDER — IOHEXOL 9 MG/ML PO SOLN
1000.0000 mL | ORAL | Status: AC
  Administered 2022-07-13: 1000 mL via ORAL

## 2022-07-13 MED ORDER — IOHEXOL 300 MG/ML  SOLN
100.0000 mL | Freq: Once | INTRAMUSCULAR | Status: AC | PRN
  Administered 2022-07-13: 100 mL via INTRAVENOUS

## 2022-07-14 ENCOUNTER — Ambulatory Visit: Payer: Medicaid Other | Admitting: Hematology

## 2022-07-14 ENCOUNTER — Inpatient Hospital Stay: Payer: Medicaid Other | Attending: Hematology

## 2022-07-14 ENCOUNTER — Inpatient Hospital Stay: Payer: Medicaid Other | Admitting: Hematology

## 2022-07-14 DIAGNOSIS — C20 Malignant neoplasm of rectum: Secondary | ICD-10-CM | POA: Insufficient documentation

## 2022-07-14 DIAGNOSIS — R918 Other nonspecific abnormal finding of lung field: Secondary | ICD-10-CM | POA: Insufficient documentation

## 2022-07-14 DIAGNOSIS — C787 Secondary malignant neoplasm of liver and intrahepatic bile duct: Secondary | ICD-10-CM | POA: Insufficient documentation

## 2022-07-14 NOTE — Progress Notes (Shared)
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[place])  12/17/2020 Initial Diagnosis   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[place])   01/13/2021 Cancer Staging   Staging form: Colon and Rectum, AJCC 8th Edition - Clinical stage from 01/13/2021: Stage IIIB (cT3, cN2a, cM0) - Signed by [name], MD on 01/19/2021 Stage prefix: Initial diagnosis Histologic grade (G): G2 Histologic grading system: 4 grade system   01/20/2021 - 03/26/2021 Chemotherapy   Patient is on Treatment Plan : RECTAL Xelox ([place]) q21d x 6 cycles     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   INTERVAL HISTORY:   James Coffey is a 52 y.o. male is here for continued valuation and management of oligometastatic rectal cancer.  Patient was last seen by me on 07/03/2022 and complained of occasional abdominal pain. He reported that his open wound was improving.     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[place]) 03/21/2022   Cancer ([place])    rectal cancer with mets to liver   Tobacco abuse     SURGICAL HISTORY: Past Surgical History:  Procedure Laterality Date   HERNIA REPAIR     INCISION AND DRAINAGE ABSCESS N/A 12/01/2020   Procedure: INCISION AND DRAINAGE ABSCESS;  Surgeon: Michael Boston, MD;  Location: [facility];  Service: General;  Laterality: N/A;   INCISION AND DRAINAGE ABSCESS N/A 12/04/2020   Procedure: INCISION AND DRAINAGE FOURNIERS GANGRENE;  Surgeon: [name], MD;  Location: [facility];  Service: General;  Laterality: N/A;   INCISION AND DRAINAGE PERIRECTAL ABSCESS N/A 11/30/2020   Procedure: INCISION AND DEBRIDEMENT OF PERIRECTAL ABSCESS AND RECTAL WALL BIOPSIES;  Surgeon: Michael Boston, MD;  Location: [facility];  Service: General;  Laterality: N/A;   INGUINAL HERNIA REPAIR Right 2007   IR IMAGING GUIDED PORT INSERTION  05/07/2021   IR RADIOLOGIST EVAL & MGMT  08/27/2021   IR RADIOLOGIST EVAL & MGMT  09/12/2021   IR RADIOLOGIST EVAL & MGMT  11/10/2021   IR RADIOLOGIST EVAL & MGMT  01/05/2022   IR SINUS/FIST TUBE CHK-NON GI  04/28/2022   LAPAROSCOPIC LOOP COLOSTOMY N/A 12/13/2020   Procedure: DIAGNOSTIC LAPAROSCOPY WITH  OPEN DESCENDING LOOP COLOSTOMY;  Surgeon: Armandina Gemma, MD;  Location: [facility];  Service: General;  Laterality: N/A;   LAPAROSCOPY N/A 08/28/2021   Procedure: LAPAROSCOPY DIAGNOSTIC;  Surgeon: Dwan Bolt, MD;  Location: [place];  Service: General;  Laterality: N/A;   OPEN PARTIAL HEPATECTOMY  Left 08/28/2021   Procedure: OPEN LEFT LATERAL HEPATECTOMY WITH INTRAOPERATIVE ULTRASOUND;  Surgeon: Dwan Bolt, MD;  Location: [place];  Service: General;  Laterality: Left;   RADIOLOGY WITH ANESTHESIA N/A 10/08/2021   Procedure: MICROWAVE ABLATION;  Surgeon: Aletta Edouard, MD;  Location: [facility];  Service: Radiology;  Laterality: N/A;   RECTAL SURGERY     XI ROBOTIC ASSISTED PARASTOMAL HERNIA REPAIR N/A 02/04/2022   Procedure: ROBOTIC ABDOMINAL  PERINEAL RESECTION, PARASTOMAL HERNIA REPAIR, BILATERAL TAP BLOCK;  Surgeon: [name], MD;  Location: [facility];  Service: General;  Laterality: N/A;    ALLERGIES:  is allergic to hydrocodone.  ROS  10 Point review of Systems was done is negative except as noted above.   MEDICATIONS:  Current Outpatient Medications  Medication Sig Dispense Refill   acetaminophen (TYLENOL) 500 MG tablet Take 2 tablets (1,000 mg total) by mouth every 8 (eight) hours as needed for mild pain or fever.     amoxicillin-clavulanate (AUGMENTIN) 875-125 MG tablet Take 1 tablet by mouth 2 (two) times daily. 14 tablet 0   LORazepam (ATIVAN) 0.5 MG tablet Take 1 tablet (0.5 mg total) by mouth at bedtime. 30 tablet 0   oxyCODONE (OXY IR/ROXICODONE) 5 MG immediate release tablet Take 1 tablet (5 mg total) by mouth every 6 (six) hours as needed for moderate pain or severe pain. 20 tablet 0   polyethylene glycol (MIRALAX) 17 g packet Take 17 g by mouth daily. Take daily to maintain regular soft colostomy output. Can increase to twice a day if need. 14 each 0   prochlorperazine (COMPAZINE) 10 MG tablet Take 1 tablet (10 mg total) by mouth every 6 (six) hours as needed for nausea or vomiting. 30 tablet 0   senna-docusate (SENNA S) 8.6-50 MG tablet Take 2 tablets by mouth at bedtime. 60 tablet 1   zolpidem (AMBIEN) 10 MG tablet Take 1 tablet (10 mg total) by mouth at bedtime as needed for sleep. 30 tablet 1   No current facility-administered medications for this visit.   Facility-Administered Medications Ordered in Other Visits  Medication Dose Route Frequency Provider Last Rate Last Admin   palonosetron (ALOXI) 0.25 MG/5ML injection            sodium chloride flush (NS) 0.9 % injection 10 mL  10 mL Intracatheter Once [name], MD        PHYSICAL EXAMINATION: ECOG PERFORMANCE STATUS: 1 - Symptomatic but completely ambulatory  Vital signs reviewed Wt Readings from Last 3 Encounters:  07/03/22 169 lb 9.6  oz (76.9 kg)  06/24/22 175 lb (79.4 kg)  06/02/22 175 lb 4.8 oz (79.5 kg)    GENERAL:alert, in no acute distress and comfortable SKIN: no acute rashes, no significant lesions EYES: conjunctiva are pink and non-injected, sclera anicteric OROPHARYNX: MMM, no exudates, no oropharyngeal erythema or ulceration NECK: supple, no JVD LYMPH:  no palpable lymphadenopathy in the cervical, axillary or inguinal regions LUNGS: clear to auscultation b/l with normal respiratory effort HEART: regular rate & rhythm ABDOMEN:  normoactive bowel sounds , non tender, not distended. Extremity: no pedal edema PSYCH: alert & oriented x 3 with fluent speech NEURO: no focal motor/sensory deficits   LABORATORY DATA:  .    Latest Ref Rng & Units 06/24/2022    9:55 AM 06/16/2022   11:31 AM 06/02/2022   12:25 PM  CBC  WBC 4.0 - 10.5 K/uL 6.2  7.7  7.8   Hemoglobin 13.0 - 17.0 g/dL 12.3  11.7  12.3   Hematocrit 39.0 - 52.0 % 39.2  35.7  36.5   Platelets 150 - 400  K/uL 308  262  261    .    Latest Ref Rng & Units 06/24/2022    9:55 AM 06/16/2022   11:31 AM 06/02/2022   12:25 PM  CMP  Glucose 70 - 99 mg/dL 139  146  170   BUN 6 - 20 mg/dL '8  10  12   '$ Creatinine 0.61 - 1.24 mg/dL 0.83  0.64  0.68   Sodium 135 - 145 mmol/L 134  138  138   Potassium 3.5 - 5.1 mmol/L 3.8  3.6  4.0   Chloride 98 - 111 mmol/L 102  106  105   CO2 22 - 32 mmol/L '24  25  27   '$ Calcium 8.9 - 10.3 mg/dL 8.8  9.2  9.5   Total Protein 6.5 - 8.1 g/dL 7.5  7.3  7.2   Total Bilirubin 0.3 - 1.2 mg/dL 0.8  0.7  0.5   Alkaline Phos 38 - 126 U/L 108  109  117   AST 15 - 41 U/L '16  9  11   '$ ALT 0 - 44 U/L '23  8  9    '$ Lab Results  Component Value Date   FERRITIN 66 06/02/2022    SURGICAL PATHOLOGY   THIS IS AN ADDENDUM REPORT   CASE: [id number]  PATIENT: [facility]  Surgical Pathology Report  Addendum    Reason for Addendum #1:  DNA Mismatch Repair IHC Results   Clinical History: Fournier's Gangrene; Cancer ? ([facility])       FINAL MICROSCOPIC DIAGNOSIS:   A. IRRIGATION AND DEBRIDEMENT OF SCROTUM AND PENIS:  Acute necrotizing inflammation extensively involving fascia    B. Rectal wall biopsies:  Adenocarcinoma of the rectum        RADIOGRAPHIC STUDIES: I have personally reviewed the radiological images as listed and agreed with the findings in the report. CT CHEST ABDOMEN PELVIS W CONTRAST  Result Date: 07/13/2022 CLINICAL DATA:  Follow-up metastatic rectal carcinoma. Abdominal pain. Completed chemotherapy. Previous ablation of the hepatic metastasis. Restaging. * Tracking Code: BO * EXAM: CT CHEST, ABDOMEN, AND PELVIS WITH CONTRAST TECHNIQUE: Multidetector CT imaging of the chest, abdomen and pelvis was performed following the standard protocol during bolus administration of intravenous contrast. RADIATION DOSE REDUCTION: This exam was performed according to the departmental dose-optimization program which includes automated exposure control, adjustment of the mA and/or kV according to patient size and/or use of iterative reconstruction technique. CONTRAST:  120m OMNIPAQUE IOHEXOL 300 MG/ML  SOLN COMPARISON:  Chest CT on 03/20/2022, and AP CT on 06/24/2022 FINDINGS: CT CHEST FINDINGS Cardiovascular: No acute findings. Aortic and coronary atherosclerotic calcification incidentally noted. Mediastinum/Lymph Nodes: No masses or pathologically enlarged lymph nodes identified. Lungs/Pleura: Pulmonary nodule in the lingula measuring 2.3 x 1.7 cm shows no significant change since prior study. Central right upper lobe pulmonary nodule measuring 1.5 x 0.9 cm is also stable since prior study. Upper lobe pleural-parenchymal scarring shows no significant change. No new or enlarging pulmonary nodules or masses identified. No evidence of pleural effusion. Musculoskeletal:  No suspicious bone lesions identified. CT ABDOMEN AND PELVIS FINDINGS Hepatobiliary: Postop changes from left hepatic lobe wedge resection remains  stable. No evidence of recurrent mass at this site. Ablation defect in the anterior right hepatic lobe also remains stable, without evidence of recurrence at this site. A large hypovascular mass in the medial liver dome shows mild increase in size, currently measuring 7.6 x 6.1 cm, compared to 6.6 x 5.8 cm previously. Another hypovascular mass in  the lateral liver dome currently measures 5.2 x 3.5 cm, compared to 5.0 by 3.3 cm previously. Gallbladder is unremarkable. No evidence of biliary ductal dilatation. Pancreas:  No mass or inflammatory changes. Spleen:  Within normal limits in size and appearance. Adrenals/Urinary tract: No suspicious masses or hydronephrosis. Urinary bladder remains mildly distended, but is unchanged in appearance. Stomach/Bowel: Postop changes again seen from left lower quadrant end colostomy. Large left lower quadrant parastomal hernia is again seen containing multiple small bowel loops. Fluid and gas collection in the presacral space measures 4.7 x 2.1 cm, without significant change since prior study. Asymmetric soft tissue prominence along the left lateral inferior margin of this collection remains stable and could be due to tumor or post treatment changes. Vascular/Lymphatic: No pathologically enlarged lymph nodes identified. No acute vascular findings. Aortic atherosclerotic calcification incidentally noted. Reproductive:  No mass or other significant abnormality identified. Other:  None. Musculoskeletal:  No suspicious bone lesions identified. IMPRESSION: Mild increase in size of liver metastases in the medial liver dome. No other new or increased sites of metastatic disease identified. Stable bilateral pulmonary metastases. Stable fluid and gas collection in presacral space. Asymmetric soft tissue prominence along the left lateral inferior margin of this collection also remains stable, and could be due to tumor or post treatment changes. Continued attention on follow-up imaging  recommended. Stable large left lower quadrant parastomal hernia containing multiple small bowel loops. Aortic Atherosclerosis (ICD10-I70.0). Electronically Signed   By: Marlaine Hind M.D.   On: 07/13/2022 14:06     ASSESSMENT & PLAN:   James Coffey is a 52 y.o. male with   1.  Oligometastatic rectal cancer with 2 visible liver metastases solitary lesion in the right upper lobe of the lung.-newly noted on CT chest abdomen pelvis. KRAS mutated MMR IHC normal  2.  Initially diagnosed locally Advanced Rectal Adenocarcinoma, c(T3c, N2), mismatch repair protein normal -presented to ED on 11/29/20 with septic shock secondary to Fournier's gangrene -CT AP showed findings suspicious for necrotizing perineal soft tissue infection with irregular presacral nodule. Rectal biopsies obtained on 11/30/20 confirmed rectal adenocarcinoma. -Baseline CEA on 12/09/20 was elevated at 16.7. -He underwent diagnostic laparoscopy and open descending loop colostomy on 12/13/20 with Dr. Harlow Asa. -Chest CT on 12/14/20 was negative for metastatic disease. -Repeat CEA on 01/06/21 showed further elevation to 52.55. -staging pelvis MRI on 01/12/21 showed: rectal adenocarcinoma T stage: At least T3c, possible involvement of prostatic apex; N stage: N2; long segment rectal tumor, involving anal sphincters. -s/p neoadjuvant FOLFOX chemotherapy S/p resection of liver met and thermal ablation of 2nd liver met Currently completing Chemo radiation of rectal mass Plan for SBRT of isolated lung lesion.  3.  Iron deficiency-currently anemia has resolved and ferritin is reasonable Lab Results  Component Value Date   FERRITIN 66 06/02/2022   4 Insomnia -lorazepam prn  5. Recurrent PERIANAL ABSCESS.  Patient hospitalized in October for pelvic abscess status post abdominal perineal resection.  6.  Multifactorial sexual dysfunction  PLAN: -Discussed concerning growth of 3 spots on liver  which are obviously larger in size. One of which has  increased from 3.5 cm to 6 cm, another from 2.7 cm to 5 cm. Concerning for progression of other spots that have grown -Results showed fluid in abdomin but no signs of infection -order chest CT scan. If no new obvious findings, refer to Interventional radiologist for liver directed treatment. -if broader progression, need to discuss further treatment to proceed -if only progression in liver, discuss with  interventional radiology of chemotherapy directly into the liver -educated pt that other treatment options would need to be considered if disease progresses to other areas. -discussed that this disease is not curative despite treatments -answered all of patient's and his daughter's questions regarding treatment options -refill Lorazepam for nausea -Discussed option of referral to [facility] to hear an additional opinion - patient declines this currently.  FOLLOW-UP: ***  The total time spent in the appointment was *** minutes* .  All of the patient's questions were answered with apparent satisfaction. The patient knows to call the clinic with any problems, questions or concerns.   Sullivan Lone MD MS AAHIVMS [facility] Hematology/Oncology Physician [facility]  .*Total Encounter Time as defined by the Centers for Medicare and Medicaid Services includes, in addition to the face-to-face time of a patient visit (documented in the note above) non-face-to-face time: obtaining and reviewing outside history, ordering and reviewing medications, tests or procedures, care coordination (communications with other health care professionals or caregivers) and documentation in the medical record.   Zettie Cooley, am acting as a Education administrator for Sullivan Lone, MD.

## 2022-07-16 ENCOUNTER — Ambulatory Visit
Admission: RE | Admit: 2022-07-16 | Discharge: 2022-07-16 | Disposition: A | Discharge status: Home / Self Care | Payer: Medicaid Other | Source: Ambulatory Visit | Attending: Interventional Radiology | Admitting: Interventional Radiology

## 2022-07-16 DIAGNOSIS — C787 Secondary malignant neoplasm of liver and intrahepatic bile duct: Secondary | ICD-10-CM

## 2022-07-16 DIAGNOSIS — C19 Malignant neoplasm of rectosigmoid junction: Secondary | ICD-10-CM

## 2022-07-16 NOTE — Progress Notes (Signed)
Chief Complaint: Patient was consulted remotely today (TeleHealth) for worsening metastatic disease to the liver at the request of Dr. Irene Limbo.  History of Present Illness: James Coffey is a 52 y.o. 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 He is status post resection of a segment III left lobe metastatic lesion by Dr. [name] on 08/28/2021.  He underwent AP resection of rectal carcinoma on 02/04/2022 by Dr. [name] with pathology demonstrating moderately differentiated invasive carcinoma with focally positive margin, lymphovascular invasion, perineural invasion and metastatic carcinoma in 5 of 10 lymph nodes.  A postoperative presacral fluid collection was drained by percutaneous catheter drainage on 03/17/2022.The drain was removed on 04/28/2022.  CT on 04/28/2022 demonstrated 2 new metastatic lesions in the superior liver with a 3.5 x 2.9 cm lesion at the boundary of segments IV and V and a 2.7 x 2.1 cm lesion in the lateral dome of the right lobe in segment VIII.  Both lesions showed increase in size on 06/24/2022 with the larger measuring 6 x 5.8 cm and the more peripheral lesion 5.0 x 3.3 cm.  Follow-up CT on 07/13/2022 demonstrates stable size of lingular and right upper lobe pulmonary nodules after prior radiation therapy and increase in the liver lesions now measuring 7.6 x 6.1 cm and 5.2 x 3.5 cm.  Previously resected and ablated lesions have not recurred.  James Coffey states that he is having some variable pain which he rates at 3-4/10.  He mostly complains of back pain.  He is not having pain every day.  He has poor appetite but nausea has improved after medication.  His weight is stable.  He denies fever.  Ostomy output is non-bloody.  He has not had chemotherapy since surgery in August.  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    Cancer ([place])    rectal cancer with mets to liver   Tobacco abuse     Past Surgical History:  Procedure Laterality Date   HERNIA REPAIR     INCISION AND DRAINAGE ABSCESS N/A 12/01/2020   Procedure: INCISION AND DRAINAGE ABSCESS;  Surgeon: Michael Boston, MD;  Location: [facility];  Service: General;  Laterality: N/A;   INCISION AND DRAINAGE ABSCESS N/A 12/04/2020   Procedure: INCISION AND DRAINAGE FOURNIERS GANGRENE;  Surgeon: [name], MD;  Location: [facility];  Service: General;  Laterality: N/A;   INCISION AND DRAINAGE PERIRECTAL ABSCESS N/A 11/30/2020   Procedure: INCISION AND DEBRIDEMENT OF PERIRECTAL ABSCESS AND RECTAL WALL BIOPSIES;  Surgeon: Michael Boston, MD;  Location: [facility];  Service: General;  Laterality: N/A;   INGUINAL HERNIA REPAIR Right 2007   IR IMAGING GUIDED PORT INSERTION  05/07/2021   IR RADIOLOGIST EVAL & MGMT  08/27/2021   IR RADIOLOGIST EVAL & MGMT  09/12/2021   IR RADIOLOGIST EVAL & MGMT  11/10/2021   IR RADIOLOGIST EVAL & MGMT  01/05/2022   IR SINUS/FIST TUBE CHK-NON GI  04/28/2022   LAPAROSCOPIC LOOP COLOSTOMY N/A 12/13/2020   Procedure: DIAGNOSTIC LAPAROSCOPY WITH  OPEN DESCENDING LOOP COLOSTOMY;  Surgeon: Armandina Gemma, MD;  Location: [facility];  Service: General;  Laterality: N/A;   LAPAROSCOPY N/A 08/28/2021   Procedure: LAPAROSCOPY DIAGNOSTIC;  Surgeon: Dwan Bolt, MD;  Location: [facility];  Service: General;  Laterality: N/A;   OPEN PARTIAL HEPATECTOMY  Left 08/28/2021   Procedure: OPEN  LEFT LATERAL HEPATECTOMY WITH INTRAOPERATIVE ULTRASOUND;  Surgeon: Dwan Bolt, MD;  Location: [facility];  Service: General;  Laterality: Left;   RADIOLOGY WITH ANESTHESIA N/A 10/08/2021   Procedure: MICROWAVE ABLATION;  Surgeon: Aletta Edouard, MD;  Location: [facility];  Service: Radiology;  Laterality: N/A;   RECTAL SURGERY     XI ROBOTIC ASSISTED PARASTOMAL HERNIA REPAIR N/A 02/04/2022   Procedure: ROBOTIC ABDOMINAL PERINEAL RESECTION, PARASTOMAL HERNIA REPAIR, BILATERAL TAP BLOCK;   Surgeon: [name], MD;  Location: [facility];  Service: General;  Laterality: N/A;    Allergies: Hydrocodone  Medications: Prior to Admission medications   Medication Sig Start Date End Date Taking? Authorizing Provider  acetaminophen (TYLENOL) 500 MG tablet Take 2 tablets (1,000 mg total) by mouth every 8 (eight) hours as needed for mild pain or fever. 12/19/20   Norm Parcel, PA-C  amoxicillin-clavulanate (AUGMENTIN) 875-125 MG tablet Take 1 tablet by mouth 2 (two) times daily. 03/22/22   Samuella Cota, MD  LORazepam (ATIVAN) 0.5 MG tablet Take 1 tablet (0.5 mg total) by mouth at bedtime. 07/10/22   [name], MD  oxyCODONE (OXY IR/ROXICODONE) 5 MG immediate release tablet Take 1 tablet (5 mg total) by mouth every 6 (six) hours as needed for moderate pain or severe pain. 07/10/22   [name], MD  polyethylene glycol (MIRALAX) 17 g packet Take 17 g by mouth daily. Take daily to maintain regular soft colostomy output. Can increase to twice a day if need. 06/24/22   Winferd Humphrey, PA-C  prochlorperazine (COMPAZINE) 10 MG tablet Take 1 tablet (10 mg total) by mouth every 6 (six) hours as needed for nausea or vomiting. 07/10/22   [name], MD  senna-docusate (SENNA S) 8.6-50 MG tablet Take 2 tablets by mouth at bedtime. 07/10/22   [name], MD  zolpidem (AMBIEN) 10 MG tablet Take 1 tablet (10 mg total) by mouth at bedtime as needed for sleep. 03/22/22   Samuella Cota, MD     Family History  Problem Relation Age of Onset   CAD Neg Hx    Inflammatory bowel disease Neg Hx     Social History   Socioeconomic History   Marital status: Married    Spouse name: Not on file   Number of children: Not on file   Years of education: Not on file   Highest education level: Not on file  Occupational History   Occupation: truck driver  Tobacco Use   Smoking status: Every Day    Packs/day: 0.25    Types: Cigarettes   Smokeless tobacco: Never   Vaping Use   Vaping Use: Never used  Substance and Sexual Activity   Alcohol use: No   Drug use: No   Sexual activity: Never  Other Topics Concern   Not on file  Social History Narrative   From [place].  Came to the [place] in 2000.   Social Determinants of Health   Financial Resource Strain: Not on file  Food Insecurity: No Food Insecurity (03/16/2022)   Hunger Vital Sign    Worried About Running Out of Food in the Last Year: Never true    Ran Out of Food in the Last Year: Never true  Transportation Needs: No Transportation Needs (03/16/2022)   PRAPARE - Hydrologist (Medical): No    Lack of Transportation (Non-Medical): No  Physical Activity: Not on file  Stress: Not on file  Social Connections: Not on  file    ECOG Status: 1 - Symptomatic but completely ambulatory  Review of Systems  Constitutional:  Positive for appetite change.  Respiratory: Negative.    Cardiovascular: Negative.   Gastrointestinal:  Positive for abdominal pain and nausea. Negative for blood in stool, constipation and vomiting.  Genitourinary: Negative.   Musculoskeletal:  Positive for back pain.  Neurological: Negative.     Review of Systems: A 12 point ROS discussed and pertinent positives are indicated in the HPI above.  All other systems are negative.    Physical Exam No direct physical exam was performed (except for noted visual exam findings with Video Visits).   Vital Signs: There were no vitals taken for this visit.  Imaging: CT CHEST ABDOMEN PELVIS W CONTRAST  Result Date: 07/13/2022 CLINICAL DATA:  Follow-up metastatic rectal carcinoma. Abdominal pain. Completed chemotherapy. Previous ablation of the hepatic metastasis. Restaging. * Tracking Code: BO * EXAM: CT CHEST, ABDOMEN, AND PELVIS WITH CONTRAST TECHNIQUE: Multidetector CT imaging of the chest, abdomen and pelvis was performed following the standard protocol during bolus administration of intravenous  contrast. RADIATION DOSE REDUCTION: This exam was performed according to the departmental dose-optimization program which includes automated exposure control, adjustment of the mA and/or kV according to patient size and/or use of iterative reconstruction technique. CONTRAST:  117m OMNIPAQUE IOHEXOL 300 MG/ML  SOLN COMPARISON:  Chest CT on 03/20/2022, and AP CT on 06/24/2022 FINDINGS: CT CHEST FINDINGS Cardiovascular: No acute findings. Aortic and coronary atherosclerotic calcification incidentally noted. Mediastinum/Lymph Nodes: No masses or pathologically enlarged lymph nodes identified. Lungs/Pleura: Pulmonary nodule in the lingula measuring 2.3 x 1.7 cm shows no significant change since prior study. Central right upper lobe pulmonary nodule measuring 1.5 x 0.9 cm is also stable since prior study. Upper lobe pleural-parenchymal scarring shows no significant change. No new or enlarging pulmonary nodules or masses identified. No evidence of pleural effusion. Musculoskeletal:  No suspicious bone lesions identified. CT ABDOMEN AND PELVIS FINDINGS Hepatobiliary: Postop changes from left hepatic lobe wedge resection remains stable. No evidence of recurrent mass at this site. Ablation defect in the anterior right hepatic lobe also remains stable, without evidence of recurrence at this site. A large hypovascular mass in the medial liver dome shows mild increase in size, currently measuring 7.6 x 6.1 cm, compared to 6.6 x 5.8 cm previously. Another hypovascular mass in the lateral liver dome currently measures 5.2 x 3.5 cm, compared to 5.0 by 3.3 cm previously. Gallbladder is unremarkable. No evidence of biliary ductal dilatation. Pancreas:  No mass or inflammatory changes. Spleen:  Within normal limits in size and appearance. Adrenals/Urinary tract: No suspicious masses or hydronephrosis. Urinary bladder remains mildly distended, but is unchanged in appearance. Stomach/Bowel: Postop changes again seen from left lower  quadrant end colostomy. Large left lower quadrant parastomal hernia is again seen containing multiple small bowel loops. Fluid and gas collection in the presacral space measures 4.7 x 2.1 cm, without significant change since prior study. Asymmetric soft tissue prominence along the left lateral inferior margin of this collection remains stable and could be due to tumor or post treatment changes. Vascular/Lymphatic: No pathologically enlarged lymph nodes identified. No acute vascular findings. Aortic atherosclerotic calcification incidentally noted. Reproductive:  No mass or other significant abnormality identified. Other:  None. Musculoskeletal:  No suspicious bone lesions identified. IMPRESSION: Mild increase in size of liver metastases in the medial liver dome. No other new or increased sites of metastatic disease identified. Stable bilateral pulmonary metastases. Stable fluid and gas collection  in presacral space. Asymmetric soft tissue prominence along the left lateral inferior margin of this collection also remains stable, and could be due to tumor or post treatment changes. Continued attention on follow-up imaging recommended. Stable large left lower quadrant parastomal hernia containing multiple small bowel loops. Aortic Atherosclerosis (ICD10-I70.0). Electronically Signed   By: Marlaine Hind M.D.   On: 07/13/2022 14:06   CT Abdomen Pelvis W Contrast  Result Date: 06/24/2022 CLINICAL DATA:  Abdominal pain for 3 weeks. Metastatic rectal cancer. Previous abscess EXAM: CT ABDOMEN AND PELVIS WITH CONTRAST TECHNIQUE: Multidetector CT imaging of the abdomen and pelvis was performed using the standard protocol following bolus administration of intravenous contrast. RADIATION DOSE REDUCTION: This exam was performed according to the departmental dose-optimization program which includes automated exposure control, adjustment of the mA and/or kV according to patient size and/or use of iterative reconstruction  technique. CONTRAST:  155m OMNIPAQUE IOHEXOL 300 MG/ML  SOLN COMPARISON:  CT 04/28/2022 and older FINDINGS: Lower chest: Breathing motion at the lung bases. There is some linear opacity seen likely scar or atelectasis. No pleural effusion. There is a mass in the lingula on series 9, image 18 this measures 2.5 by 1.9 cm and previously when measured in the same fashion as today 2.6 by 1.9 cm. Coronary artery calcifications are seen. Please correlate for other coronary risk factors. Hepatobiliary: Liver metastases are identified. At least 3 large lesions are identified. Dome lesion near the margin of the IVC and hepatic vein centrally on the previous examination measured 3.5 x 2.9 cm and today is larger at 6.6 by 5.8 cm. The peripheral lesion at the same level on the previous exam in segment 4 measured 2.7 x 2.1 cm and today on image 15 measures 5.0 by 3.3 cm. Lesion seen more caudal along segment 5 is similar. There is some tiny foci seen in segment 3 as well. Underlying fatty liver infiltration. Gallbladder is nondilated. Patent portal vein. Pancreas: Moderate atrophy of the pancreas without mass lesion is similar to previous. Spleen: Spleen is nonenlarged.  Preserved enhancement. Adrenals/Urinary Tract: The adrenal glands are preserved. No enhancing renal mass. There is a punctate low-density focus medially in the upper pole left kidney measuring 3-4 mm, too small to completely characterize although likely small cysts. Focus in the upper pole of the right kidney medially is also seen and stable from prior measuring maximal diameter of 2.1 cm and Hounsfield unit of 2 consistent with a Bosniak 1 cyst. No specific imaging follow-up recommended. No collecting system dilatation. The ureters have a normal course and caliber down to the bladder. Bladder has a preserved contour but has diffuse mild wall thickening. Stomach/Bowel: Stomach is mildly distended with fluid. The duodenal mass normal course and caliber as does  the rest of the small bowel. The exception is a large left-sided anterior abdominal wall hernia along the margin of the rectus abdominis muscle. Again this is a large hernia with herniation of mesenteric fat, small bowel parastomal hernia due to the associated colostomy location. The colon proximal to this is nondilated with scattered colonic stool. There are some loops of small bowel in the central pelvis above the bladder which have wall thickening. There is some mesenteric stranding. Please correlate for infectious or inflammatory process. Please correlate for any history of radiation. The wall thickening is increased from previous. Previously there was a pigtail catheter in the fluid collection along the surgical bed along the rectum in the presacral space with mass effect along the prostate in  the posterior wall of the bladder. There is some residual gas and fluid in this location today measuring 4.0 by 2.0 cm on series 2, image 87. This is slightly larger. The amount fluid is increased relative to air. The pigtail catheter is no longer seen. Please correlate for history of abscess. Just to the left this is a small area of nodular tissue on series 4, image 16 measuring 19 by 17 mm which is unchanged from previous. There is some simple fluid along the dependent portions of the mesentery in the upper pelvis. No new rim enhancing fluid collections along the peritoneum. Vascular/Lymphatic: Normal caliber aorta and IVC. Scattered partially calcified plaque along the aorta and iliac vessels. No specific abnormal lymph node enlargement seen in the abdomen or pelvis. Reproductive: Ill-defined margins of the prostate with the adjacent inflammatory process. Other: None Musculoskeletal: Scattered degenerative changes along the spine with some disc bulging particularly at L4-5 and L5-S1. Degenerative changes along the pelvis. Small sclerotic area along the left femoral head greater than right is again noted and may  represent bone islands. IMPRESSION: Interval removal of the pigtail catheter in the presacral space. The fluid collection this location is similar in size to previous but the amount of fluid in the collection is increased compared to previous. Please correlate for abscess. There is adjacent extensive presacral fat stranding. Small amount of ill-defined fluid in the dependent mesentery of the upper pelvis and lower abdomen without additional rim enhancing fluid collections. Progression of size of multiple liver metastases. Stable lingular nodule. Persistent soft tissue nodule in the low pelvis just to the left anterior to the fluid collection in the presacral space and posterior bladder. This could represent an area of neoplasm. Increasing wall thickening along loops of small bowel in the central pelvis. This could be infectious or inflammatory. Please correlate for any history of pelvic radiation. Once again there is a large peristomal hernia along the left side adjacent to the colostomy involving mesentery fat and small bowel without obstruction. This is a wide mouth hernia. Persistent wall thickening of the urinary bladder. There is ill-defined margins of the prostate with some soft tissue thickening and stranding. Electronically Signed   By: Jill Side M.D.   On: 06/24/2022 12:17    Labs:  CBC: Recent Labs    03/18/22 0108 06/02/22 1225 06/16/22 1131 06/24/22 0955  WBC 9.7 7.8 7.7 6.2  HGB 7.5* 12.3* 11.7* 12.3*  HCT 24.4* 36.5* 35.7* 39.2  PLT 312 261 262 308    COAGS: Recent Labs    10/02/21 0907 03/17/22 0438  INR 1.0 1.4*    BMP: Recent Labs    03/18/22 0108 06/02/22 1225 06/16/22 1131 06/24/22 0955  NA 135 138 138 134*  K 3.7 4.0 3.6 3.8  CL 104 105 106 102  CO2 '24 27 25 24  '$ GLUCOSE 129* 170* 146* 139*  BUN '8 12 10 8  '$ CALCIUM 8.2* 9.5 9.2 8.8*  CREATININE 0.70 0.68 0.64 0.83  GFRNONAA >60 >60 >60 >60    LIVER FUNCTION TESTS: Recent Labs    03/18/22 0108  06/02/22 1225 06/16/22 1131 06/24/22 0955  BILITOT 1.0 0.5 0.7 0.8  AST 24 11* 9* 16  ALT '23 9 8 23  '$ ALKPHOS 156* 117 109 108  PROT 6.6 7.2 7.3 7.5  ALBUMIN 2.3* 3.9 3.7 3.4*    Assessment and Plan:  I spoke with James Coffey and his daughter by phone.  We discussed treatment options for his worsening hepatic metastatic  disease.  The two new enlarging lesions are too large to ablate percutaneously.  Liver directed therapy would potentially be of benefit in conjunction with additional chemotherapy.  Yttrium-90 radioembolization would be the most effective catheter directed therapy for treatment of unresectable metastatic colorectal carcinoma. Given size of the current lesions and distribution in both the right lobe and the larger lesion likely crossing into segment IV of the left lobe, he likely would not be considered a surgical candidate for additional hepatic resection.  I discussed details of Y-90 radioembolization with James Coffey and his daughter.  Initial hepatic arteriography will need to be performed for mapping purposes as well as possible embolization of the gastroduodenal artery, other gastric supply and injection of MAA for calculation of lung shunt fraction.  Radioembolization would then be performed 1-2 weeks later, initially in the right lobe.  If mapping arteriography determines probable left hepatic arterial supply to the larger lesion that appears to potentially cross into segment IV, additional left lobe therapy would then likely be needed.  Discussion, James Coffey would like to proceed.  We will begin the authorization and scheduling process.  Based on current scheduling, it likely will be about 1 month for a mapping procedure and potentially as much as 6 weeks before actual administration of Y-90 microspheres. I told James Coffey and his daughter that I would check with Dr. Irene Limbo if he wanted to start any chemotherapy in the meantime, which would be fine, as long as he does not receive  any Avastin prior to arteriography.  Electronically Signed: Azzie Roup 07/16/2022, 2:44 PM    I spent a total of 15 Minutes in remote  clinical consultation, greater than 50% of which was counseling/coordinating care for metastatic rectal carcinoma to liver.    Visit type: Audio only (telephone). Audio (no video) only due to patient's lack of internet/smartphone capability. Alternative for in-person consultation at [facility], [address] [place], [place], [place]. This visit type was conducted due to national recommendations for restrictions regarding the COVID-19 Pandemic (e.g. social distancing).  This format is felt to be most appropriate for this patient at this time.  All issues noted in this document were discussed and addressed.

## 2022-07-17 ENCOUNTER — Telehealth: Payer: Self-pay | Admitting: Hematology

## 2022-07-17 ENCOUNTER — Ambulatory Visit (HOSPITAL_COMMUNITY): Payer: Medicaid Other

## 2022-07-17 NOTE — Telephone Encounter (Signed)
Per 2/6 IB, message left with pt daughter

## 2022-07-20 ENCOUNTER — Telehealth: Payer: Self-pay

## 2022-07-20 ENCOUNTER — Other Ambulatory Visit: Payer: Self-pay

## 2022-07-20 ENCOUNTER — Inpatient Hospital Stay (HOSPITAL_BASED_OUTPATIENT_CLINIC_OR_DEPARTMENT_OTHER): Payer: Medicaid Other | Admitting: Hematology

## 2022-07-20 ENCOUNTER — Encounter: Payer: Self-pay | Admitting: Hematology

## 2022-07-20 ENCOUNTER — Other Ambulatory Visit (HOSPITAL_COMMUNITY): Payer: Self-pay

## 2022-07-20 ENCOUNTER — Inpatient Hospital Stay: Payer: Medicaid Other

## 2022-07-20 DIAGNOSIS — C787 Secondary malignant neoplasm of liver and intrahepatic bile duct: Secondary | ICD-10-CM | POA: Diagnosis present

## 2022-07-20 DIAGNOSIS — G4709 Other insomnia: Secondary | ICD-10-CM

## 2022-07-20 DIAGNOSIS — C20 Malignant neoplasm of rectum: Secondary | ICD-10-CM | POA: Diagnosis present

## 2022-07-20 DIAGNOSIS — Z95828 Presence of other vascular implants and grafts: Secondary | ICD-10-CM

## 2022-07-20 DIAGNOSIS — R918 Other nonspecific abnormal finding of lung field: Secondary | ICD-10-CM | POA: Diagnosis not present

## 2022-07-20 LAB — CBC WITH DIFFERENTIAL (CANCER CENTER ONLY)
Abs Immature Granulocytes: 0.06 10*3/uL (ref 0.00–0.07)
Basophils Absolute: 0.1 10*3/uL (ref 0.0–0.1)
Basophils Relative: 1 %
Eosinophils Absolute: 0.1 10*3/uL (ref 0.0–0.5)
Eosinophils Relative: 1 %
HCT: 28 % — ABNORMAL LOW (ref 39.0–52.0)
Hemoglobin: 8.8 g/dL — ABNORMAL LOW (ref 13.0–17.0)
Immature Granulocytes: 1 %
Lymphocytes Relative: 8 %
Lymphs Abs: 0.7 10*3/uL (ref 0.7–4.0)
MCH: 26.9 pg (ref 26.0–34.0)
MCHC: 31.4 g/dL (ref 30.0–36.0)
MCV: 85.6 fL (ref 80.0–100.0)
Monocytes Absolute: 0.8 10*3/uL (ref 0.1–1.0)
Monocytes Relative: 8 %
Neutro Abs: 8.1 10*3/uL — ABNORMAL HIGH (ref 1.7–7.7)
Neutrophils Relative %: 81 %
Platelet Count: 313 10*3/uL (ref 150–400)
RBC: 3.27 MIL/uL — ABNORMAL LOW (ref 4.22–5.81)
RDW: 16.6 % — ABNORMAL HIGH (ref 11.5–15.5)
WBC Count: 9.9 10*3/uL (ref 4.0–10.5)
nRBC: 0 % (ref 0.0–0.2)

## 2022-07-20 LAB — CMP (CANCER CENTER ONLY)
ALT: 14 U/L (ref 0–44)
AST: 13 U/L — ABNORMAL LOW (ref 15–41)
Albumin: 3.2 g/dL — ABNORMAL LOW (ref 3.5–5.0)
Alkaline Phosphatase: 136 U/L — ABNORMAL HIGH (ref 38–126)
Anion gap: 12 (ref 5–15)
BUN: 50 mg/dL — ABNORMAL HIGH (ref 6–20)
CO2: 21 mmol/L — ABNORMAL LOW (ref 22–32)
Calcium: 9.4 mg/dL (ref 8.9–10.3)
Chloride: 105 mmol/L (ref 98–111)
Creatinine: 11.07 mg/dL (ref 0.60–1.20)
GFR, Estimated: 5 mL/min — ABNORMAL LOW (ref >60)
Glucose, Bld: 160 mg/dL — ABNORMAL HIGH (ref 70–99)
Potassium: 5 mmol/L (ref 3.5–5.1)
Sodium: 138 mmol/L (ref 135–145)
Total Bilirubin: 1 mg/dL (ref 0.3–1.2)
Total Protein: 7.6 g/dL (ref 6.5–8.1)

## 2022-07-20 LAB — CEA (ACCESS): CEA (CHCC): 5.51 ng/mL — ABNORMAL HIGH (ref 0.00–5.00)

## 2022-07-20 MED ORDER — SENNOSIDES-DOCUSATE SODIUM 8.6-50 MG PO TABS
2.0000 | ORAL_TABLET | Freq: Every day | ORAL | 1 refills | Status: DC
  Filled 2022-07-20: qty 60, 30d supply, fill #0

## 2022-07-20 MED ORDER — PROCHLORPERAZINE MALEATE 10 MG PO TABS
10.0000 mg | ORAL_TABLET | Freq: Four times a day (QID) | ORAL | 0 refills | Status: DC | PRN
  Filled 2022-07-20: qty 30, 8d supply, fill #0

## 2022-07-20 MED ORDER — SODIUM CHLORIDE 0.9% FLUSH
10.0000 mL | Freq: Once | INTRAVENOUS | Status: AC
  Administered 2022-07-20: 10 mL

## 2022-07-20 MED ORDER — OXYCODONE HCL 5 MG PO TABS
5.0000 mg | ORAL_TABLET | Freq: Four times a day (QID) | ORAL | 0 refills | Status: DC | PRN
  Filled 2022-07-20: qty 60, 15d supply, fill #0

## 2022-07-20 MED ORDER — HEPARIN SOD (PORK) LOCK FLUSH 100 UNIT/ML IV SOLN
500.0000 [IU] | Freq: Once | INTRAVENOUS | Status: AC
  Administered 2022-07-20: 500 [IU]

## 2022-07-20 MED ORDER — LORAZEPAM 0.5 MG PO TABS
0.5000 mg | ORAL_TABLET | Freq: Every evening | ORAL | 0 refills | Status: DC
  Filled 2022-07-20: qty 34, 34d supply, fill #0

## 2022-07-20 NOTE — Progress Notes (Signed)
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[place])  12/17/2020 Initial Diagnosis   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[place])   01/13/2021 Cancer Staging   Staging form: Colon and Rectum, AJCC 8th Edition - Clinical stage from 01/13/2021: Stage IIIB (cT3, cN2a, cM0) - Signed by [name], MD on 01/19/2021 Stage prefix: Initial diagnosis Histologic grade (G): G2 Histologic grading system: 4 grade system   01/20/2021 - 03/26/2021 Chemotherapy   Patient is on Treatment Plan : RECTAL Xelox ([place]) q21d x 6 cycles     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   INTERVAL HISTORY:   James Coffey is a 52 y.o. male is here for continued valuation and management of oligometastatic rectal cancer. Patient is here with a interpreter.   Patient was last seen by me on 07/03/2022 and he complained of abdominal pain. He has been to a Psychologist, sport and exercise since our last visit.   Patient reports he has been doing  well overall without any new medical concerns. He does note that his wound has opened up again with drainage. He reports his abdominal pain is better since our last visit. He denies of any abnormal bleeding.   Patient notes he has been eating fairly well. He needs refill on nausea and pain medications.   He denies fever, chills, night sweats, unexpected weight loss, abdominal pain, chest pain, or leg swelling.      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[place]) 03/21/2022   Cancer ([place])    rectal cancer with mets to liver   Tobacco abuse     SURGICAL HISTORY: Past Surgical History:  Procedure Laterality Date   HERNIA REPAIR     INCISION AND DRAINAGE ABSCESS N/A 12/01/2020   Procedure: INCISION AND DRAINAGE ABSCESS;  Surgeon: Michael Boston, MD;  Location: [facility];  Service: General;  Laterality: N/A;   INCISION AND DRAINAGE ABSCESS N/A 12/04/2020   Procedure: INCISION AND DRAINAGE FOURNIERS GANGRENE;  Surgeon: [name], MD;  Location: [facility];  Service: General;  Laterality: N/A;   INCISION AND DRAINAGE PERIRECTAL ABSCESS N/A 11/30/2020   Procedure: INCISION AND DEBRIDEMENT OF PERIRECTAL ABSCESS AND RECTAL WALL BIOPSIES;  Surgeon: Michael Boston, MD;  Location: [facility];  Service: General;  Laterality: N/A;   INGUINAL HERNIA REPAIR Right 2007   IR IMAGING GUIDED PORT INSERTION  05/07/2021   IR RADIOLOGIST EVAL & MGMT  08/27/2021   IR RADIOLOGIST EVAL & MGMT  09/12/2021   IR RADIOLOGIST EVAL & MGMT  11/10/2021   IR RADIOLOGIST EVAL & MGMT  01/05/2022  IR SINUS/FIST TUBE CHK-NON GI  04/28/2022   LAPAROSCOPIC LOOP COLOSTOMY N/A 12/13/2020   Procedure: DIAGNOSTIC LAPAROSCOPY WITH  OPEN DESCENDING LOOP COLOSTOMY;  Surgeon: Armandina Gemma, MD;  Location: [facility];  Service: General;  Laterality: N/A;   LAPAROSCOPY N/A 08/28/2021   Procedure: LAPAROSCOPY DIAGNOSTIC;  Surgeon: Dwan Bolt, MD;  Location: [facility];  Service: General;  Laterality: N/A;    OPEN PARTIAL HEPATECTOMY  Left 08/28/2021   Procedure: OPEN LEFT LATERAL HEPATECTOMY WITH INTRAOPERATIVE ULTRASOUND;  Surgeon: Dwan Bolt, MD;  Location: [facility];  Service: General;  Laterality: Left;   RADIOLOGY WITH ANESTHESIA N/A 10/08/2021   Procedure: MICROWAVE ABLATION;  Surgeon: Aletta Edouard, MD;  Location: [facility];  Service: Radiology;  Laterality: N/A;   RECTAL SURGERY     XI ROBOTIC ASSISTED PARASTOMAL HERNIA REPAIR N/A 02/04/2022   Procedure: ROBOTIC ABDOMINAL PERINEAL RESECTION, PARASTOMAL HERNIA REPAIR, BILATERAL TAP BLOCK;  Surgeon: [name], MD;  Location: [facility];  Service: General;  Laterality: N/A;    ALLERGIES:  is allergic to hydrocodone.  ROS  10 Point review of Systems was done is negative except as noted above.   MEDICATIONS:  Current Outpatient Medications  Medication Sig Dispense Refill   acetaminophen (TYLENOL) 500 MG tablet Take 2 tablets (1,000 mg total) by mouth every 8 (eight) hours as needed for mild pain or fever.     amoxicillin-clavulanate (AUGMENTIN) 875-125 MG tablet Take 1 tablet by mouth 2 (two) times daily. 14 tablet 0   LORazepam (ATIVAN) 0.5 MG tablet Take 1 tablet (0.5 mg total) by mouth at bedtime. 30 tablet 0   oxyCODONE (OXY IR/ROXICODONE) 5 MG immediate release tablet Take 1 tablet (5 mg total) by mouth every 6 (six) hours as needed for moderate pain or severe pain. 20 tablet 0   polyethylene glycol (MIRALAX) 17 g packet Take 17 g by mouth daily. Take daily to maintain regular soft colostomy output. Can increase to twice a day if need. 14 each 0   prochlorperazine (COMPAZINE) 10 MG tablet Take 1 tablet (10 mg total) by mouth every 6 (six) hours as needed for nausea or vomiting. 30 tablet 0   senna-docusate (SENNA S) 8.6-50 MG tablet Take 2 tablets by mouth at bedtime. 60 tablet 1   zolpidem (AMBIEN) 10 MG tablet Take 1 tablet (10 mg total) by mouth at bedtime as needed for sleep. 30 tablet 1   No current facility-administered medications  for this visit.   Facility-Administered Medications Ordered in Other Visits  Medication Dose Route Frequency Provider Last Rate Last Admin   palonosetron (ALOXI) 0.25 MG/5ML injection            sodium chloride flush (NS) 0.9 % injection 10 mL  10 mL Intracatheter Once [name], MD        PHYSICAL EXAMINATION: ECOG PERFORMANCE STATUS: 1 - Symptomatic but completely ambulatory  Vital signs reviewed Wt Readings from Last 3 Encounters:  07/03/22 169 lb 9.6 oz (76.9 kg)  06/24/22 175 lb (79.4 kg)  06/02/22 175 lb 4.8 oz (79.5 kg)    GENERAL:alert, in no acute distress and comfortable SKIN: no acute rashes, no significant lesions EYES: conjunctiva are pink and non-injected, sclera anicteric OROPHARYNX: MMM, no exudates, no oropharyngeal erythema or ulceration NECK: supple, no JVD LYMPH:  no palpable lymphadenopathy in the cervical, axillary or inguinal regions LUNGS: clear to auscultation b/l with normal respiratory effort HEART: regular rate & rhythm ABDOMEN:  normoactive bowel sounds , non tender, not distended.  Extremity: no pedal edema PSYCH: alert & oriented x 3 with fluent speech NEURO: no focal motor/sensory deficits   LABORATORY DATA:  .    Latest Ref Rng & Units 07/23/2022    5:00 AM 07/22/2022    4:08 AM 07/21/2022   10:26 PM  CBC  WBC 4.0 - 10.5 K/uL 9.3  9.4  11.1   Hemoglobin 13.0 - 17.0 g/dL 8.5  8.8  8.3   Hematocrit 39.0 - 52.0 % 28.7  28.6  26.9   Platelets 150 - 400 K/uL 379  317  308    .    Latest Ref Rng & Units 07/25/2022    5:00 AM 07/24/2022    6:01 AM 07/23/2022    8:59 AM  CMP  Glucose 70 - 99 mg/dL 161  169  172   BUN 6 - 20 mg/dL 21  17  18   $ Creatinine 0.61 - 1.24 mg/dL 0.80  0.94  0.98   Sodium 135 - 145 mmol/L 137  139  140   Potassium 3.5 - 5.1 mmol/L 4.1  4.7  4.7   Chloride 98 - 111 mmol/L 105  107  110   CO2 22 - 32 mmol/L 24  23  21   $ Calcium 8.9 - 10.3 mg/dL 9.1  9.3  9.4    Lab Results  Component Value Date    FERRITIN 66 06/02/2022    SURGICAL PATHOLOGY   THIS IS AN ADDENDUM REPORT   CASE: [id number]  PATIENT: [facility]  Surgical Pathology Report  Addendum    Reason for Addendum #1:  DNA Mismatch Repair IHC Results   Clinical History: Fournier's Gangrene; Cancer ? ([facility])      FINAL MICROSCOPIC DIAGNOSIS:   A. IRRIGATION AND DEBRIDEMENT OF SCROTUM AND PENIS:  Acute necrotizing inflammation extensively involving fascia    B. Rectal wall biopsies:  Adenocarcinoma of the rectum        RADIOGRAPHIC STUDIES: I have personally reviewed the radiological images as listed and agreed with the findings in the report. No results found.   ASSESSMENT & PLAN:   Madoxx Hankes is a 52 y.o. male with   1.  Oligometastatic rectal cancer with 2 visible liver metastases solitary lesion in the right upper lobe of the lung.-newly noted on CT chest abdomen pelvis. KRAS mutated MMR IHC normal  2.  Initially diagnosed locally Advanced Rectal Adenocarcinoma, c(T3c, N2), mismatch repair protein normal -presented to ED on 11/29/20 with septic shock secondary to Fournier's gangrene -CT AP showed findings suspicious for necrotizing perineal soft tissue infection with irregular presacral nodule. Rectal biopsies obtained on 11/30/20 confirmed rectal adenocarcinoma. -Baseline CEA on 12/09/20 was elevated at 16.7. -He underwent diagnostic laparoscopy and open descending loop colostomy on 12/13/20 with Dr. Harlow Asa. -Chest CT on 12/14/20 was negative for metastatic disease. -Repeat CEA on 01/06/21 showed further elevation to 52.55. -staging pelvis MRI on 01/12/21 showed: rectal adenocarcinoma T stage: At least T3c, possible involvement of prostatic apex; N stage: N2; long segment rectal tumor, involving anal sphincters. -s/p neoadjuvant FOLFOX chemotherapy S/p resection of liver met and thermal ablation of 2nd liver met Currently completing Chemo radiation of rectal mass S/pSBRT of isolated lung lesions x 2.  3.   Iron deficiency-currently anemia has resolved and ferritin is reasonable Lab Results  Component Value Date   FERRITIN 66 06/02/2022   4 Insomnia -lorazepam prn  5. Recurrent PERIANAL ABSCESS.  Patient hospitalized in October for pelvic abscess status post abdominal perineal resection.  6.  Multifactorial sexual dysfunction  PLAN: -Discussed lab results from today, 07/20/2022, with the patient. CBC shows decreased hemoglobin of 8.8 and hematocrit of 28.0.  -cmp results returned after patient left clinic -- creatinine noted to be significant elevated to 11 and patients daughter [name]  was called and she informed patient to got to the ER. -Discussed CT chest abdomen pelvis with contrast scan from 07/13/2022 results with the patient in detail. Showed Mild increase in size of liver metastases in the medial liver dome. Stable bilateral pulmonary metastases. -Patient will get treated with Dr. Kathlene Cote.  -Discussed the option of IV Iron if iron levels are decreased during this visit. Patient denies to receive IV Iron currently.  -recommended to start drinking ensure or boost in between meals.   FOLLOW-UP: Recommended patient go the ER to evaluate AKI ? Urinary obstruction   The total time spent in the appointment was 20 minutes* .  All of the patient's questions were answered with apparent satisfaction. The patient knows to call the clinic with any problems, questions or concerns.   Sullivan Lone MD MS AAHIVMS [facility] Hematology/Oncology Physician [facility]  .*Total Encounter Time as defined by the Centers for Medicare and Medicaid Services includes, in addition to the face-to-face time of a patient visit (documented in the note above) non-face-to-face time: obtaining and reviewing outside history, ordering and reviewing medications, tests or procedures, care coordination (communications with other health care professionals or caregivers) and documentation in the medical  record.   I, Cleda Mccreedy, am acting as a Education administrator for Sullivan Lone, MD. .I have reviewed the above documentation for accuracy and completeness, and I agree with the above. [name] MD

## 2022-07-20 NOTE — Telephone Encounter (Signed)
Attempted to contact pt's daughter. Left message stating to please return my call as pt's lab value is very concerning.

## 2022-07-20 NOTE — Progress Notes (Signed)
CRITICAL VALUE STICKER  CRITICAL VALUE:CR: 11.07  DATE & TIME NOTIFIED: 07/20/22 10:23  MD NOTIFIED: Irene Limbo  TIME OF NOTIFICATION:10:25

## 2022-07-21 ENCOUNTER — Inpatient Hospital Stay: Payer: Medicaid Other

## 2022-07-21 ENCOUNTER — Inpatient Hospital Stay (HOSPITAL_COMMUNITY)
Admission: EM | Admit: 2022-07-21 | Discharge: 2022-07-25 | DRG: 054 | Disposition: A | Discharge status: Home / Self Care | Payer: Medicaid Other | Attending: Internal Medicine | Admitting: Internal Medicine

## 2022-07-21 ENCOUNTER — Other Ambulatory Visit: Payer: Self-pay

## 2022-07-21 ENCOUNTER — Emergency Department (HOSPITAL_COMMUNITY): Payer: Medicaid Other

## 2022-07-21 DIAGNOSIS — G9341 Metabolic encephalopathy: Secondary | ICD-10-CM | POA: Diagnosis present

## 2022-07-21 DIAGNOSIS — C20 Malignant neoplasm of rectum: Secondary | ICD-10-CM

## 2022-07-21 DIAGNOSIS — C787 Secondary malignant neoplasm of liver and intrahepatic bile duct: Secondary | ICD-10-CM | POA: Diagnosis present

## 2022-07-21 DIAGNOSIS — N320 Bladder-neck obstruction: Secondary | ICD-10-CM | POA: Diagnosis present

## 2022-07-21 DIAGNOSIS — Z933 Colostomy status: Secondary | ICD-10-CM

## 2022-07-21 DIAGNOSIS — R4182 Altered mental status, unspecified: Secondary | ICD-10-CM | POA: Diagnosis present

## 2022-07-21 DIAGNOSIS — R739 Hyperglycemia, unspecified: Secondary | ICD-10-CM | POA: Diagnosis not present

## 2022-07-21 DIAGNOSIS — G936 Cerebral edema: Secondary | ICD-10-CM | POA: Diagnosis present

## 2022-07-21 DIAGNOSIS — G9389 Other specified disorders of brain: Principal | ICD-10-CM

## 2022-07-21 DIAGNOSIS — C7801 Secondary malignant neoplasm of right lung: Secondary | ICD-10-CM | POA: Diagnosis present

## 2022-07-21 DIAGNOSIS — I77810 Thoracic aortic ectasia: Secondary | ICD-10-CM | POA: Diagnosis present

## 2022-07-21 DIAGNOSIS — G935 Compression of brain: Secondary | ICD-10-CM | POA: Diagnosis present

## 2022-07-21 DIAGNOSIS — E861 Hypovolemia: Secondary | ICD-10-CM | POA: Diagnosis present

## 2022-07-21 DIAGNOSIS — E871 Hypo-osmolality and hyponatremia: Secondary | ICD-10-CM | POA: Diagnosis present

## 2022-07-21 DIAGNOSIS — E538 Deficiency of other specified B group vitamins: Secondary | ICD-10-CM | POA: Diagnosis present

## 2022-07-21 DIAGNOSIS — N1330 Unspecified hydronephrosis: Secondary | ICD-10-CM | POA: Diagnosis present

## 2022-07-21 DIAGNOSIS — Z79899 Other long term (current) drug therapy: Secondary | ICD-10-CM

## 2022-07-21 DIAGNOSIS — R339 Retention of urine, unspecified: Secondary | ICD-10-CM | POA: Diagnosis not present

## 2022-07-21 DIAGNOSIS — Z885 Allergy status to narcotic agent status: Secondary | ICD-10-CM

## 2022-07-21 DIAGNOSIS — Z1152 Encounter for screening for COVID-19: Secondary | ICD-10-CM

## 2022-07-21 DIAGNOSIS — Z85048 Personal history of other malignant neoplasm of rectum, rectosigmoid junction, and anus: Secondary | ICD-10-CM

## 2022-07-21 DIAGNOSIS — N179 Acute kidney failure, unspecified: Secondary | ICD-10-CM

## 2022-07-21 DIAGNOSIS — E872 Acidosis, unspecified: Secondary | ICD-10-CM | POA: Diagnosis present

## 2022-07-21 DIAGNOSIS — C7931 Secondary malignant neoplasm of brain: Principal | ICD-10-CM | POA: Diagnosis present

## 2022-07-21 DIAGNOSIS — E860 Dehydration: Secondary | ICD-10-CM | POA: Diagnosis present

## 2022-07-21 DIAGNOSIS — C7802 Secondary malignant neoplasm of left lung: Secondary | ICD-10-CM | POA: Diagnosis present

## 2022-07-21 DIAGNOSIS — Z9221 Personal history of antineoplastic chemotherapy: Secondary | ICD-10-CM

## 2022-07-21 DIAGNOSIS — Z923 Personal history of irradiation: Secondary | ICD-10-CM

## 2022-07-21 DIAGNOSIS — F17210 Nicotine dependence, cigarettes, uncomplicated: Secondary | ICD-10-CM | POA: Diagnosis present

## 2022-07-21 DIAGNOSIS — D638 Anemia in other chronic diseases classified elsewhere: Secondary | ICD-10-CM | POA: Diagnosis present

## 2022-07-21 LAB — CBC WITH DIFFERENTIAL/PLATELET
Abs Immature Granulocytes: 0.09 10*3/uL — ABNORMAL HIGH (ref 0.00–0.07)
Basophils Absolute: 0.1 10*3/uL (ref 0.0–0.1)
Basophils Relative: 0 %
Eosinophils Absolute: 0.1 10*3/uL (ref 0.0–0.5)
Eosinophils Relative: 1 %
HCT: 26.9 % — ABNORMAL LOW (ref 39.0–52.0)
Hemoglobin: 8.3 g/dL — ABNORMAL LOW (ref 13.0–17.0)
Immature Granulocytes: 1 %
Lymphocytes Relative: 5 %
Lymphs Abs: 0.5 10*3/uL — ABNORMAL LOW (ref 0.7–4.0)
MCH: 27 pg (ref 26.0–34.0)
MCHC: 30.9 g/dL (ref 30.0–36.0)
MCV: 87.6 fL (ref 80.0–100.0)
Monocytes Absolute: 1 10*3/uL (ref 0.1–1.0)
Monocytes Relative: 9 %
Neutro Abs: 9.4 10*3/uL — ABNORMAL HIGH (ref 1.7–7.7)
Neutrophils Relative %: 84 %
Platelets: 308 10*3/uL (ref 150–400)
RBC: 3.07 MIL/uL — ABNORMAL LOW (ref 4.22–5.81)
RDW: 17.2 % — ABNORMAL HIGH (ref 11.5–15.5)
WBC: 11.1 10*3/uL — ABNORMAL HIGH (ref 4.0–10.5)
nRBC: 0 % (ref 0.0–0.2)

## 2022-07-21 LAB — COMPREHENSIVE METABOLIC PANEL
ALT: 14 U/L (ref 0–44)
AST: 14 U/L — ABNORMAL LOW (ref 15–41)
Albumin: 2.7 g/dL — ABNORMAL LOW (ref 3.5–5.0)
Alkaline Phosphatase: 118 U/L (ref 38–126)
Anion gap: 12 (ref 5–15)
BUN: 68 mg/dL — ABNORMAL HIGH (ref 6–20)
CO2: 18 mmol/L — ABNORMAL LOW (ref 22–32)
Calcium: 8.6 mg/dL — ABNORMAL LOW (ref 8.9–10.3)
Chloride: 103 mmol/L (ref 98–111)
Creatinine, Ser: 13.42 mg/dL — ABNORMAL HIGH (ref 0.61–1.24)
GFR, Estimated: 4 mL/min — ABNORMAL LOW (ref >60)
Glucose, Bld: 147 mg/dL — ABNORMAL HIGH (ref 70–99)
Potassium: 5.1 mmol/L (ref 3.5–5.1)
Sodium: 133 mmol/L — ABNORMAL LOW (ref 135–145)
Total Bilirubin: 1.4 mg/dL — ABNORMAL HIGH (ref 0.3–1.2)
Total Protein: 7.6 g/dL (ref 6.5–8.1)

## 2022-07-21 LAB — LIPASE, BLOOD: Lipase: 30 U/L (ref 11–51)

## 2022-07-21 MED ORDER — DEXAMETHASONE SODIUM PHOSPHATE 10 MG/ML IJ SOLN
10.0000 mg | Freq: Once | INTRAMUSCULAR | Status: AC
  Administered 2022-07-22: 10 mg via INTRAVENOUS
  Filled 2022-07-21: qty 1

## 2022-07-21 NOTE — ED Provider Triage Note (Signed)
Emergency Medicine Provider Triage Evaluation Note  James Coffey , a 52 y.o. male  was evaluated in triage.  Pt complains of increased confusion.  Patient presents with daughter and wife at bedside.  Daughter who is an Therapist, sports notes that patient has had increased confusion that is unlike him.  Patient has associated dysuria.  Patient denies chest pain, shortness of breath, back pain, nausea, vomiting, hematuria, abdominal pain.  Review of Systems  Positive:  Negative:   Physical Exam  BP (!) 142/73   Pulse 91   Temp 98.6 F (37 C)   Resp 18   SpO2 100%  Gen:   Awake, no distress   Resp:  Normal effort  MSK:   Moves extremities without difficulty  Other:  Abdominal distention noted on left side of abdomen only where stoma and colostomy site is present at no tenderness to palpation to the abdomen.  Medical Decision Making  Medically screening exam initiated at 10:04 PM.  Appropriate orders placed.  [name] was informed that the remainder of the evaluation will be completed by another provider, this initial triage assessment does not replace that evaluation, and the importance of remaining in the ED until their evaluation is complete.  10:05 PM - Discussed with RN that patient is in need of a room immediately. RN aware and working on room placement.    Jule Whitsel A, PA-C 07/21/22 2205

## 2022-07-21 NOTE — ED Triage Notes (Signed)
Pts sister noticed he's confused even more than before. Daughter mentioned high creatinine levels during last checkup. Suppose of been getting blood work done yesterday but he left. Sister has noticed skin color change.

## 2022-07-21 NOTE — ED Triage Notes (Signed)
Pt wife reports abdominal distention on the left side with the colostomy bag

## 2022-07-21 NOTE — ED Provider Notes (Signed)
[place] EMERGENCY DEPARTMENT AT [facility] Provider Note   CSN: [id number] Arrival date & time: 07/21/22  2147     History {Add pertinent medical, surgical, social history, OB history to HPI:1} Chief Complaint  Patient presents with   Altered Mental Status    Cancer pt    James Coffey is a 52 y.o. male.  HPI   Patient with medical history including metastatic rectal cancer, status post chemotherapy, peritoneal resection, necrotizing fasciitis of the peritoneum presents with concerns of altered mental status.  The patient states he has no complaints he is not endorsing any headaches change in vision paresthesias or weakness the upper lower extremities no chest pain no stomach pain no nausea or vomiting.  Patient's family was at bedside and provided most of the HPI, and they states over the last few days patient appears to be behaving bizarrely, states that he is being more forgetful and not making much sense with his communication.  At baseline he can normally ambulates and hold a normal conversation.  They note that he has not been eating or drinking very much the last couple days, he states that he has decreased colostomy output.  There is been no recent falls, no fevers no chills no worsening cough no congestion no nausea or vomiting.  Reviewed patient's Chart, followed by Dr. Geanie Berlin of general surgery patient underwent robotic abdominal peritoneal resection in August 2023 wound became infected and now is currently undergoing packing.  Patient's current oncologist is Dr. Sullivan Lone, found new hepatic nodules discussed possibility of undergoing chemoradiation, noted that patient had a bump in his creatinine from 0.7 to 11, this was found on 02/09.  Home Medications Prior to Admission medications   Medication Sig Start Date End Date Taking? Authorizing Provider  acetaminophen (TYLENOL) 500 MG tablet Take 2 tablets (1,000 mg total) by mouth every 8 (eight) hours as  needed for mild pain or fever. 12/19/20   Norm Parcel, PA-C  amoxicillin-clavulanate (AUGMENTIN) 875-125 MG tablet Take 1 tablet by mouth 2 (two) times daily. 03/22/22   Samuella Cota, MD  LORazepam (ATIVAN) 0.5 MG tablet Take 1 tablet (0.5 mg total) by mouth at bedtime. 07/20/22   [name], MD  oxyCODONE (OXY IR/ROXICODONE) 5 MG immediate release tablet Take 1 tablet (5 mg total) by mouth every 6 (six) hours as needed for moderate pain or severe pain. 07/20/22   [name], MD  polyethylene glycol (MIRALAX) 17 g packet Take 17 g by mouth daily. Take daily to maintain regular soft colostomy output. Can increase to twice a day if need. 06/24/22   Winferd Humphrey, PA-C  prochlorperazine (COMPAZINE) 10 MG tablet Take 1 tablet (10 mg total) by mouth every 6 (six) hours as needed for nausea or vomiting. 07/20/22   [name], MD  senna-docusate (SENNA S) 8.6-50 MG tablet Take 2 tablets by mouth at bedtime. 07/20/22   [name], MD  zolpidem (AMBIEN) 10 MG tablet Take 1 tablet (10 mg total) by mouth at bedtime as needed for sleep. 03/22/22   Samuella Cota, MD      Allergies    Hydrocodone    Review of Systems   Review of Systems  Constitutional:  Negative for chills and fever.  Respiratory:  Negative for shortness of breath.   Cardiovascular:  Negative for chest pain.  Gastrointestinal:  Negative for abdominal pain.  Neurological:  Negative for headaches.    Physical Exam Updated Vital  Signs BP (!) 142/73   Pulse 91   Temp 98.6 F (37 C)   Resp 18   SpO2 100%  Physical Exam Vitals and nursing note reviewed. Exam conducted with a chaperone present.  Constitutional:      General: He is not in acute distress.    Appearance: He is ill-appearing.  HENT:     Head: Normocephalic and atraumatic.     Nose: No congestion.     Mouth/Throat:     Mouth: Mucous membranes are moist.     Pharynx: Oropharynx is clear.  Eyes:     Extraocular  Movements: Extraocular movements intact.     Conjunctiva/sclera: Conjunctivae normal.     Pupils: Pupils are equal, round, and reactive to light.  Cardiovascular:     Rate and Rhythm: Normal rate and regular rhythm.     Pulses: Normal pulses.     Heart sounds: No murmur heard.    No friction rub. No gallop.  Pulmonary:     Effort: No respiratory distress.     Breath sounds: No wheezing, rhonchi or rales.  Abdominal:     Palpations: Abdomen is soft.     Tenderness: There is abdominal tenderness. There is no right CVA tenderness or left CVA tenderness.     Comments: Abdomen nondistended, colostomy bag noted in the right upper quadrant, small amount of stool present none melanotic looking no frank bloody stools, abdomen was soft but slightly tender in the right lower quadrant without guarding contents or peritoneal sign.  Genitourinary:    Comments: Chaperone present, rectal exam was performed, patient has a noted wound where his rectum would be, there is slight erythema with clearish drainage no purulent discharge, none foul-smelling, please see picture for full detail Skin:    General: Skin is warm and dry.     Coloration: Skin is pale.  Neurological:     Mental Status: He is alert.     GCS: GCS eye subscore is 4. GCS verbal subscore is 5. GCS motor subscore is 6.     Cranial Nerves: Cranial nerves 2-12 are intact.     Sensory: Sensation is intact.     Coordination: Romberg sign negative. Finger-Nose-Finger Test normal.     Comments: Cranial nerves II through XII grossly intact, able to follow two-step commands, no acute weakness present.  Patient is alert and oriented x 2, cannot tell me where he is and what day it is but cannot tell me who the president is, why he is here, seems slightly slow to respond but not slurring his words.  Psychiatric:        Mood and Affect: Mood normal.     ED Results / Procedures / Treatments   Labs (all labs ordered are listed, but only abnormal  results are displayed) Labs Reviewed  CBC WITH DIFFERENTIAL/PLATELET - Abnormal; Notable for the following components:      Result Value   WBC 11.1 (*)    RBC 3.07 (*)    Hemoglobin 8.3 (*)    HCT 26.9 (*)    RDW 17.2 (*)    Neutro Abs 9.4 (*)    Lymphs Abs 0.5 (*)    Abs Immature Granulocytes 0.09 (*)    All other components within normal limits  COMPREHENSIVE METABOLIC PANEL - Abnormal; Notable for the following components:   Sodium 133 (*)    CO2 18 (*)    Glucose, Bld 147 (*)    BUN 68 (*)    Creatinine,  Ser 13.42 (*)    Calcium 8.6 (*)    Albumin 2.7 (*)    AST 14 (*)    Total Bilirubin 1.4 (*)    GFR, Estimated 4 (*)    All other components within normal limits  LIPASE, BLOOD  URINALYSIS, ROUTINE W REFLEX MICROSCOPIC    EKG None  Radiology No results found.  Procedures Procedures  {Document cardiac monitor, telemetry assessment procedure when appropriate:1}  Medications Ordered in ED Medications - No data to display  ED Course/ Medical Decision Making/ A&P   {   Click here for ABCD2, HEART and other calculatorsREFRESH Note before signing :1}                          Medical Decision Making  This patient presents to the ED for concern of altered mental status, this involves an extensive number of treatment options, and is a complaint that carries with it a high risk of complications and morbidity.  The differential diagnosis includes sepsis, metastatic disease, CVA, metabolic derailment    Additional history obtained:  Additional history obtained from family at bedside External records from outside source obtained and reviewed including oncology notes   Co morbidities that complicate the patient evaluation  Metastatic disease  Social Determinants of Health:  N/A    Lab Tests:  I Ordered, and personally interpreted labs.  The pertinent results include: CBC shows leukocytosis of 11.1, hemoglobin stable at 8.3, CMP shows CO2 of 18, glucose 147,  BUN 68, creatinine 13.4, T. bili two 1.4, lipase is 30   Imaging Studies ordered:  I ordered imaging studies including CT head, CT abdomen pelvis I independently visualized and interpreted imaging which showed *** I agree with the radiologist interpretation   Cardiac Monitoring:  The patient was maintained on a cardiac monitor.  I personally viewed and interpreted the cardiac monitored which showed an underlying rhythm of: ***   Medicines ordered and prescription drug management:  I ordered medication including ***  for ***  I have reviewed the patients home medicines and have made adjustments as needed  Critical Interventions:  ***   Reevaluation:  Presents with concerns of altered mental status, slightly slow to respond to my exam, no focal deficits, will obtain basic lab work imaging and reassess.  Consultations Obtained:  I requested consultation with the ***,  and discussed lab and imaging findings as well as pertinent plan - they recommend: ***    Test Considered:  ***    Rule out ****    Dispostion and problem list  After consideration of the diagnostic results and the patients response to treatment, I feel that the patent would benefit from ***.       {Document critical care time when appropriate:1} {Document review of labs and clinical decision tools ie heart score, Chads2Vasc2 etc:1}  {Document your independent review of radiology images, and any outside records:1} {Document your discussion with family members, caretakers, and with consultants:1} {Document social determinants of health affecting pt's care:1} {Document your decision making why or why not admission, treatments were needed:1} Final Clinical Impression(s) / ED Diagnoses Final diagnoses:  None    Rx / DC Orders ED Discharge Orders     None

## 2022-07-22 ENCOUNTER — Other Ambulatory Visit: Payer: Self-pay

## 2022-07-22 ENCOUNTER — Other Ambulatory Visit: Payer: Self-pay | Admitting: Radiation Therapy

## 2022-07-22 ENCOUNTER — Ambulatory Visit
Admit: 2022-07-22 | Discharge: 2022-07-22 | Disposition: A | Discharge status: Home / Self Care | Payer: Medicaid Other | Attending: Radiation Oncology | Admitting: Radiation Oncology

## 2022-07-22 DIAGNOSIS — G9389 Other specified disorders of brain: Secondary | ICD-10-CM

## 2022-07-22 DIAGNOSIS — Z85048 Personal history of other malignant neoplasm of rectum, rectosigmoid junction, and anus: Secondary | ICD-10-CM | POA: Diagnosis not present

## 2022-07-22 DIAGNOSIS — C7802 Secondary malignant neoplasm of left lung: Secondary | ICD-10-CM | POA: Diagnosis present

## 2022-07-22 DIAGNOSIS — G9341 Metabolic encephalopathy: Secondary | ICD-10-CM | POA: Diagnosis present

## 2022-07-22 DIAGNOSIS — D638 Anemia in other chronic diseases classified elsewhere: Secondary | ICD-10-CM | POA: Diagnosis present

## 2022-07-22 DIAGNOSIS — R338 Other retention of urine: Secondary | ICD-10-CM | POA: Diagnosis not present

## 2022-07-22 DIAGNOSIS — C7931 Secondary malignant neoplasm of brain: Secondary | ICD-10-CM | POA: Diagnosis present

## 2022-07-22 DIAGNOSIS — R4182 Altered mental status, unspecified: Secondary | ICD-10-CM | POA: Diagnosis not present

## 2022-07-22 DIAGNOSIS — C20 Malignant neoplasm of rectum: Secondary | ICD-10-CM

## 2022-07-22 DIAGNOSIS — Z933 Colostomy status: Secondary | ICD-10-CM | POA: Diagnosis not present

## 2022-07-22 DIAGNOSIS — R339 Retention of urine, unspecified: Secondary | ICD-10-CM | POA: Diagnosis not present

## 2022-07-22 DIAGNOSIS — I77810 Thoracic aortic ectasia: Secondary | ICD-10-CM | POA: Diagnosis present

## 2022-07-22 DIAGNOSIS — N320 Bladder-neck obstruction: Secondary | ICD-10-CM | POA: Diagnosis present

## 2022-07-22 DIAGNOSIS — Z9221 Personal history of antineoplastic chemotherapy: Secondary | ICD-10-CM | POA: Diagnosis not present

## 2022-07-22 DIAGNOSIS — E861 Hypovolemia: Secondary | ICD-10-CM | POA: Diagnosis present

## 2022-07-22 DIAGNOSIS — N179 Acute kidney failure, unspecified: Secondary | ICD-10-CM

## 2022-07-22 DIAGNOSIS — F17210 Nicotine dependence, cigarettes, uncomplicated: Secondary | ICD-10-CM | POA: Diagnosis present

## 2022-07-22 DIAGNOSIS — E872 Acidosis, unspecified: Secondary | ICD-10-CM | POA: Diagnosis present

## 2022-07-22 DIAGNOSIS — Z79899 Other long term (current) drug therapy: Secondary | ICD-10-CM | POA: Diagnosis not present

## 2022-07-22 DIAGNOSIS — C7801 Secondary malignant neoplasm of right lung: Secondary | ICD-10-CM | POA: Diagnosis present

## 2022-07-22 DIAGNOSIS — C787 Secondary malignant neoplasm of liver and intrahepatic bile duct: Secondary | ICD-10-CM | POA: Diagnosis not present

## 2022-07-22 DIAGNOSIS — E538 Deficiency of other specified B group vitamins: Secondary | ICD-10-CM | POA: Diagnosis present

## 2022-07-22 DIAGNOSIS — C7800 Secondary malignant neoplasm of unspecified lung: Secondary | ICD-10-CM

## 2022-07-22 DIAGNOSIS — E871 Hypo-osmolality and hyponatremia: Secondary | ICD-10-CM | POA: Diagnosis present

## 2022-07-22 DIAGNOSIS — Z7189 Other specified counseling: Secondary | ICD-10-CM

## 2022-07-22 DIAGNOSIS — N1330 Unspecified hydronephrosis: Secondary | ICD-10-CM | POA: Diagnosis present

## 2022-07-22 DIAGNOSIS — G936 Cerebral edema: Secondary | ICD-10-CM | POA: Diagnosis present

## 2022-07-22 DIAGNOSIS — E860 Dehydration: Secondary | ICD-10-CM | POA: Diagnosis present

## 2022-07-22 DIAGNOSIS — Z1152 Encounter for screening for COVID-19: Secondary | ICD-10-CM | POA: Diagnosis not present

## 2022-07-22 DIAGNOSIS — G935 Compression of brain: Secondary | ICD-10-CM | POA: Diagnosis present

## 2022-07-22 DIAGNOSIS — R739 Hyperglycemia, unspecified: Secondary | ICD-10-CM | POA: Diagnosis not present

## 2022-07-22 LAB — CBC
HCT: 28.6 % — ABNORMAL LOW (ref 39.0–52.0)
Hemoglobin: 8.8 g/dL — ABNORMAL LOW (ref 13.0–17.0)
MCH: 27.1 pg (ref 26.0–34.0)
MCHC: 30.8 g/dL (ref 30.0–36.0)
MCV: 88 fL (ref 80.0–100.0)
Platelets: 317 10*3/uL (ref 150–400)
RBC: 3.25 MIL/uL — ABNORMAL LOW (ref 4.22–5.81)
RDW: 17.2 % — ABNORMAL HIGH (ref 11.5–15.5)
WBC: 9.4 10*3/uL (ref 4.0–10.5)
nRBC: 0 % (ref 0.0–0.2)

## 2022-07-22 LAB — URINALYSIS, ROUTINE W REFLEX MICROSCOPIC
Bilirubin Urine: NEGATIVE
Glucose, UA: NEGATIVE mg/dL
Hgb urine dipstick: NEGATIVE
Ketones, ur: NEGATIVE mg/dL
Leukocytes,Ua: NEGATIVE
Nitrite: NEGATIVE
Protein, ur: NEGATIVE mg/dL
Specific Gravity, Urine: 1.01 (ref 1.005–1.030)
pH: 6 (ref 5.0–8.0)

## 2022-07-22 LAB — CREATININE, SERUM
Creatinine, Ser: 7.49 mg/dL — ABNORMAL HIGH (ref 0.61–1.24)
GFR, Estimated: 8 mL/min — ABNORMAL LOW (ref >60)

## 2022-07-22 LAB — AMMONIA: Ammonia: 11 umol/L (ref 9–35)

## 2022-07-22 LAB — BASIC METABOLIC PANEL
Anion gap: 10 (ref 5–15)
BUN: 30 mg/dL — ABNORMAL HIGH (ref 6–20)
CO2: 22 mmol/L (ref 22–32)
Calcium: 9.1 mg/dL (ref 8.9–10.3)
Chloride: 108 mmol/L (ref 98–111)
Creatinine, Ser: 2.46 mg/dL — ABNORMAL HIGH (ref 0.61–1.24)
GFR, Estimated: 31 mL/min — ABNORMAL LOW (ref >60)
Glucose, Bld: 215 mg/dL — ABNORMAL HIGH (ref 70–99)
Potassium: 4.9 mmol/L (ref 3.5–5.1)
Sodium: 140 mmol/L (ref 135–145)

## 2022-07-22 LAB — RESP PANEL BY RT-PCR (RSV, FLU A&B, COVID)  RVPGX2
Influenza A by PCR: NEGATIVE
Influenza B by PCR: NEGATIVE
Resp Syncytial Virus by PCR: NEGATIVE
SARS Coronavirus 2 by RT PCR: NEGATIVE

## 2022-07-22 LAB — BLOOD GAS, VENOUS
Acid-base deficit: 0.3 mmol/L (ref 0.0–2.0)
Bicarbonate: 24.1 mmol/L (ref 20.0–28.0)
O2 Saturation: 57.4 %
Patient temperature: 37
pCO2, Ven: 38 mmHg — ABNORMAL LOW (ref 44–60)
pH, Ven: 7.41 (ref 7.25–7.43)
pO2, Ven: 35 mmHg (ref 32–45)

## 2022-07-22 LAB — LACTIC ACID, PLASMA: Lactic Acid, Venous: 1 mmol/L (ref 0.5–1.9)

## 2022-07-22 MED ORDER — DEXAMETHASONE SODIUM PHOSPHATE 10 MG/ML IJ SOLN
6.0000 mg | Freq: Four times a day (QID) | INTRAMUSCULAR | Status: DC
  Administered 2022-07-22 – 2022-07-25 (×13): 6 mg via INTRAVENOUS
  Filled 2022-07-22 (×12): qty 1

## 2022-07-22 MED ORDER — PANTOPRAZOLE SODIUM 40 MG IV SOLR
40.0000 mg | INTRAVENOUS | Status: DC
  Administered 2022-07-22 – 2022-07-25 (×4): 40 mg via INTRAVENOUS
  Filled 2022-07-22 (×4): qty 10

## 2022-07-22 MED ORDER — PROCHLORPERAZINE EDISYLATE 10 MG/2ML IJ SOLN: 5.0000 mg | Freq: Four times a day (QID) | INTRAMUSCULAR | Status: DC | PRN

## 2022-07-22 MED ORDER — LORAZEPAM 0.5 MG PO TABS
0.5000 mg | ORAL_TABLET | Freq: Every day | ORAL | Status: DC
  Administered 2022-07-22 – 2022-07-24 (×3): 0.5 mg via ORAL
  Filled 2022-07-22 (×2): qty 1

## 2022-07-22 MED ORDER — HEPARIN SODIUM (PORCINE) 5000 UNIT/ML IJ SOLN
5000.0000 [IU] | Freq: Three times a day (TID) | INTRAMUSCULAR | Status: DC
  Administered 2022-07-22 – 2022-07-25 (×8): 5000 [IU] via SUBCUTANEOUS
  Filled 2022-07-22 (×7): qty 1

## 2022-07-22 MED ORDER — SODIUM CHLORIDE 0.9 % IV BOLUS
1000.0000 mL | Freq: Once | INTRAVENOUS | Status: AC
  Administered 2022-07-22: 1000 mL via INTRAVENOUS

## 2022-07-22 MED ORDER — ACETAMINOPHEN 325 MG PO TABS: 650.0000 mg | ORAL_TABLET | Freq: Four times a day (QID) | ORAL | Status: DC | PRN

## 2022-07-22 MED ORDER — POLYETHYLENE GLYCOL 3350 17 G PO PACK: 17.0000 g | PACK | Freq: Every day | ORAL | Status: DC | PRN

## 2022-07-22 MED ORDER — SODIUM CHLORIDE 0.9 % IV SOLN: INTRAVENOUS | Status: DC

## 2022-07-22 NOTE — ED Notes (Signed)
Wound care nurse at bedside.

## 2022-07-22 NOTE — H&P (Addendum)
History and Physical  James Coffey [id number] DOB: Mar 06, 1971 DOA: 07/21/2022  Referring physician: Gentry Roch  PCP: Patient, No Pcp Per  Outpatient Specialists: Medical oncology, GI, interventional radiology, general surgery. Patient coming from: Home  Chief Complaint: Altered mental status.  HPI: James Coffey is a 52 y.o. male with medical history significant for oligometastatic rectal cancer status postchemotherapy, with 2 visible liver metastases, bilateral pulmonary metastases, initially diagnosed with locally advanced rectal adenocarcinoma, iron deficiency anemia, recurrent perianal abscess(hospitalized in October for pelvic abscess status post abdominal perineal resection) followed by Dr. [name], who presented to the ED from home via EMS due to altered mental status.  Reportedly according to the daughter he has been more confused lately, more forgetful and not making much sense with his communication.  Associated with poor oral intake over the past 2 days and decreased colostomy output.  The patient had abnormal labs results at his last checkup with a creatinine of 11 from normal baseline.  The patient was brought into the ED for further evaluation.  In the ED, the patient denies having any pain.  Denies any headache or change in his vision.  No nausea or vomiting.  He is alert and oriented x 1.  Noncontrast CT head revealed showed a 3 cm left frontal lobe mass with surrounding edema with local mass effect 6 mm midline shift.  CT abdomen and pelvis without contrast shows bilateral hydronephrosis with distended bladder.  A Foley catheter was inserted with 1300 of urine output.  EDP discussed the case with Dr. Irene Limbo who recommended IV Decadron 10 mg now then 6 mg every 6 hours for his brain edema.  EDP also discussed the case with nephrology Dr. Candiss Norse who recommended transfer to [facility] for further evaluation.  EDP requested admission.  The patient was admitted by [facility],  hospitalist service.  ED Course: Tmax 98.6.  BP 120/80, pulse 72, respiratory 17, O2 saturation 96% on room air.  Lab studies significant for serum sodium 133, bicarb 18, glucose 147, BUN 68, creatinine 13.42 from 11.07, albumin 2.7, T. bili 1.4, GFR 4.  WBC 11.1.  Hemoglobin 8.3, MCV 87.  Review of Systems: Review of systems as noted in the HPI. All other systems reviewed and are negative.   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   Cancer ([place])    rectal cancer with mets to liver   Tobacco abuse    Past Surgical History:  Procedure Laterality Date   HERNIA REPAIR     INCISION AND DRAINAGE ABSCESS N/A 12/01/2020   Procedure: INCISION AND DRAINAGE ABSCESS;  Surgeon: Michael Boston, MD;  Location: [facility];  Service: General;  Laterality: N/A;   INCISION AND DRAINAGE ABSCESS N/A 12/04/2020   Procedure: INCISION AND DRAINAGE FOURNIERS GANGRENE;  Surgeon: [name], MD;  Location: [facility];  Service: General;  Laterality: N/A;   INCISION AND DRAINAGE PERIRECTAL ABSCESS N/A 11/30/2020   Procedure: INCISION AND DEBRIDEMENT OF PERIRECTAL ABSCESS AND RECTAL WALL BIOPSIES;  Surgeon: Michael Boston, MD;  Location: [facility];  Service: General;  Laterality: N/A;   INGUINAL HERNIA REPAIR Right 2007   IR IMAGING GUIDED PORT INSERTION  05/07/2021   IR RADIOLOGIST EVAL & MGMT  08/27/2021   IR RADIOLOGIST EVAL & MGMT  09/12/2021   IR RADIOLOGIST EVAL & MGMT  11/10/2021   IR RADIOLOGIST EVAL & MGMT  01/05/2022   IR SINUS/FIST TUBE CHK-NON GI  04/28/2022   LAPAROSCOPIC LOOP COLOSTOMY N/A 12/13/2020  Procedure: DIAGNOSTIC LAPAROSCOPY WITH  OPEN DESCENDING LOOP COLOSTOMY;  Surgeon: Armandina Gemma, MD;  Location: [facility];  Service: General;  Laterality: N/A;   LAPAROSCOPY N/A 08/28/2021   Procedure: LAPAROSCOPY DIAGNOSTIC;  Surgeon: Dwan Bolt, MD;  Location: [place];  Service: General;  Laterality: N/A;   OPEN PARTIAL HEPATECTOMY  Left 08/28/2021   Procedure: OPEN  LEFT LATERAL HEPATECTOMY WITH INTRAOPERATIVE ULTRASOUND;  Surgeon: Dwan Bolt, MD;  Location: [place];  Service: General;  Laterality: Left;   RADIOLOGY WITH ANESTHESIA N/A 10/08/2021   Procedure: MICROWAVE ABLATION;  Surgeon: Aletta Edouard, MD;  Location: [facility];  Service: Radiology;  Laterality: N/A;   RECTAL SURGERY     XI ROBOTIC ASSISTED PARASTOMAL HERNIA REPAIR N/A 02/04/2022   Procedure: ROBOTIC ABDOMINAL PERINEAL RESECTION, PARASTOMAL HERNIA REPAIR, BILATERAL TAP BLOCK;  Surgeon: [name], MD;  Location: [facility];  Service: General;  Laterality: N/A;    Social History:  reports that he has been smoking cigarettes. He has been smoking an average of .25 packs per day. He has never used smokeless tobacco. He reports that he does not drink alcohol and does not use drugs.   Allergies  Allergen Reactions   Hydrocodone Itching    Family History  Problem Relation Age of Onset   CAD Neg Hx    Inflammatory bowel disease Neg Hx       Prior to Admission medications   Medication Sig Start Date End Date Taking? Authorizing Provider  acetaminophen (TYLENOL) 500 MG tablet Take 2 tablets (1,000 mg total) by mouth every 8 (eight) hours as needed for mild pain or fever. 12/19/20   Norm Parcel, PA-C  amoxicillin-clavulanate (AUGMENTIN) 875-125 MG tablet Take 1 tablet by mouth 2 (two) times daily. 03/22/22   Samuella Cota, MD  LORazepam (ATIVAN) 0.5 MG tablet Take 1 tablet (0.5 mg total) by mouth at bedtime. 07/20/22   [name], MD  oxyCODONE (OXY IR/ROXICODONE) 5 MG immediate release tablet Take 1 tablet (5 mg total) by mouth every 6 (six) hours as needed for moderate pain or severe pain. 07/20/22   [name], MD  polyethylene glycol (MIRALAX) 17 g packet Take 17 g by mouth daily. Take daily to maintain regular soft colostomy output. Can increase to twice a day if need. 06/24/22   Winferd Humphrey, PA-C  prochlorperazine (COMPAZINE) 10 MG tablet Take 1 tablet (10  mg total) by mouth every 6 (six) hours as needed for nausea or vomiting. 07/20/22   [name], MD  senna-docusate (SENNA S) 8.6-50 MG tablet Take 2 tablets by mouth at bedtime. 07/20/22   [name], MD  zolpidem (AMBIEN) 10 MG tablet Take 1 tablet (10 mg total) by mouth at bedtime as needed for sleep. 03/22/22   Samuella Cota, MD    Physical Exam: BP 127/79   Pulse 68   Temp 98.6 F (37 C)   Resp (!) 22   SpO2 96%   General: 52 y.o. year-old male well developed well nourished in no acute distress.  Alert and oriented x1. Cardiovascular: Regular rate and rhythm with no rubs or gallops.  No thyromegaly or JVD noted.  No lower extremity edema. 2/4 pulses in all 4 extremities. Respiratory: Clear to auscultation with no wheezes or rales. Good inspiratory effort. Abdomen: Soft nontender colostomy bag left lower quadrant.  Nondistended with normal bowel sounds x4 quadrants. Muskuloskeletal: No cyanosis, clubbing or edema noted bilaterally Neuro: CN II-XII intact, strength, sensation, reflexes  Skin: s/p perineal resection Psychiatry: Judgement and insight appear altered. Mood is appropriate for condition and setting          Labs on Admission:  Basic Metabolic Panel: Recent Labs  Lab 07/20/22 0910 07/21/22 2226  NA 138 133*  K 5.0 5.1  CL 105 103  CO2 21* 18*  GLUCOSE 160* 147*  BUN 50* 68*  CREATININE 11.07* 13.42*  CALCIUM 9.4 8.6*   Liver Function Tests: Recent Labs  Lab 07/20/22 0910 07/21/22 2226  AST 13* 14*  ALT 14 14  ALKPHOS 136* 118  BILITOT 1.0 1.4*  PROT 7.6 7.6  ALBUMIN 3.2* 2.7*   Recent Labs  Lab 07/21/22 2226  LIPASE 30   Recent Labs  Lab 07/21/22 2337  AMMONIA 11   CBC: Recent Labs  Lab 07/20/22 0910 07/21/22 2226  WBC 9.9 11.1*  NEUTROABS 8.1* 9.4*  HGB 8.8* 8.3*  HCT 28.0* 26.9*  MCV 85.6 87.6  PLT 313 308   Cardiac Enzymes: No results for input(s): "CKTOTAL", "CKMB", "CKMBINDEX", "TROPONINI" in the last 168  hours.  BNP (last 3 results) No results for input(s): "BNP" in the last 8760 hours.  ProBNP (last 3 results) No results for input(s): "PROBNP" in the last 8760 hours.  CBG: No results for input(s): "GLUCAP" in the last 168 hours.  Radiological Exams on Admission: CT ABDOMEN PELVIS WO CONTRAST  Result Date: 07/22/2022 CLINICAL DATA:  Acute nonlocalized abdominal pain. Left-sided abdominal distention. EXAM: CT ABDOMEN AND PELVIS WITHOUT CONTRAST TECHNIQUE: Multidetector CT imaging of the abdomen and pelvis was performed following the standard protocol without IV contrast. RADIATION DOSE REDUCTION: This exam was performed according to the departmental dose-optimization program which includes automated exposure control, adjustment of the mA and/or kV according to patient size and/or use of iterative reconstruction technique. COMPARISON:  07/13/2022 FINDINGS: Lower chest: Motion artifact limits evaluation. Lobulated nodule in the left lingula measuring 1.9 cm diameter, unchanged. This is likely metastatic. Hepatobiliary: Liver lesions are again demonstrated although poorly defined on noncontrast imaging. Largest mass is in the dome of the liver, measuring about 7.8 cm diameter. This corresponds to known liver metastasis. There is limited ability to compare the lesion to the previous contrast-enhanced scan. Partial left hepatic resection. Gallbladder and bile ducts are unremarkable. Pancreas: Unremarkable. No pancreatic ductal dilatation or surrounding inflammatory changes. Spleen: Normal in size without focal abnormality. Adrenals/Urinary Tract: No adrenal gland nodules. There is mild bilateral hydronephrosis. No renal or ureteral stones are demonstrated. The bladder is distended without wall thickening. Changes likely to represent urine stasis or reflux. Stomach/Bowel: Stomach, small bowel, and colon are not abnormally distended. There is a partial colonic resection with left lower quadrant descending  colostomy. There is a large peristomal hernia containing small and large bowel. This appears similar to the prior study. Soft tissue infiltration and fluid in the presacral space with infiltration or scarring into the surrounding perirectal fat. This appearance is similar to the prior study and could represent either or combination of residual/recurrent tumor and postoperative change. Continued attention on follow-up imaging is suggested. Vascular/Lymphatic: Aortic atherosclerosis. No enlarged abdominal or pelvic lymph nodes. Reproductive: Prostate is unremarkable. Other: No free air or free fluid in the abdomen. Musculoskeletal: Degenerative changes in the spine and hips. No destructive bone lesions. IMPRESSION: 1. No significant change since prior study. 2. Again demonstrated are metastatic lesions throughout the liver and in the left lingula. 3. Postoperative changes with partial colectomy. Left lower quadrant ileostomy with large peristomal hernia, similar to  prior study. No evidence of proximal obstruction. Gas and soft tissue in the presacral space may represent post treatment changes or residual/recurrent tumor. Follow-up as indicated. 4. Bilateral hydronephrosis and hydroureter with distended bladder, likely representing reflux disease. Bladder distention could be physiologic or due to outlet obstruction or decreased motility. Electronically Signed   By: Lucienne Capers M.D.   On: 07/22/2022 00:11   CT Head Wo Contrast  Result Date: 07/21/2022 CLINICAL DATA:  Altered mental status EXAM: CT HEAD WITHOUT CONTRAST TECHNIQUE: Contiguous axial images were obtained from the base of the skull through the vertex without intravenous contrast. RADIATION DOSE REDUCTION: This exam was performed according to the departmental dose-optimization program which includes automated exposure control, adjustment of the mA and/or kV according to patient size and/or use of iterative reconstruction technique. COMPARISON:  None  Available. FINDINGS: Brain: Hyperdense left frontal lobe mass measuring by 3 x 2.8 x 2.5 cm. Considerable surrounding edema and local mass effect. 6 mm midline shift to the right. Compression of the right frontal horn of left lateral ventricle. Diffuse sulcal swelling on the left consistent with edema Vascular: No hyperdense vessels.  Carotid vascular calcification Skull: Normal. Negative for fracture or focal lesion. Sinuses/Orbits: No acute finding. Other: None IMPRESSION: 3 cm hyperdense left frontal lobe mass with prominent surrounding edema and local mass effect. 6 mm midline shift to the right. Findings favored to represent metastatic disease over primary brain neoplasm given history of metastatic rectal cancer. Critical Value/emergent results were called by telephone at the time of interpretation on 07/21/2022 at 11:31 pm to provider PA Deno Etienne , who verbally acknowledged these results. Electronically Signed   By: Donavan Foil M.D.   On: 07/21/2022 23:35    EKG: I independently viewed the EKG done and my findings are as followed: Sinus rhythm rate of 68.  Nonspecific ST-T changes.  QTc 406.  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 Treat underlying conditions Reorient as needed Fall, aspiration and delirium precautions  AKI, suspect postobstructive Baseline creatinine appears to be 0.7 with GFR greater than 60 Presented with creatinine over the 13 and GFR 4 Bilateral hydronephrosis, acute urinary retention Foley catheter placed on 07/22/2022 Gentle IV fluid hydration Monitor urine output Repeat BMP in the morning  Non anion gap metabolic acidosis secondary to acute renal insufficiency Presented with serum bicarb 18, anion gap of 12 Continue IV fluid hydration Repeat BMP in the morning  Metastatic colorectal cancer with left frontal  lobe brain mass with 6 mm midline shift to the right Received 1 dose of IV Decadron 10 mg in the ED Continue IV Decadron 6 mg every 6 hours for brain edema, as recommended by medical oncology, Dr. Grier Mitts CT abdomen and pelvis without contrast showed no significant change since prior study.    Metastatic lesions throughout the liver and in the left lingula.   Defer management to oncology, EDP discussed with Dr. Irene Limbo.  Postoperative changes with partial colectomy left lower quadrant ileostomy with large peristomal hernia, similar to prior study.   No evidence of proximal obstruction.  Gas and soft tissue in the presacral space may represent posttreatment changes or residual/recurrent tumor, follow-up as indicated.    Bilateral hydronephrosis and hydroureter with distended bladder status post Foley catheter insertion on 07/22/2022 More than 1300 urine output after Foley catheter insertion.  Hypovolemic hyponatremia Presented with serum sodium 133 IV  fluid hydration NS at 50 cc/h x 2 days.  Elevated T. Bili Nonspecific, T. bili 1.4 Trend and monitor LFTs  Anemia of chronic disease Hemoglobin 8.8 MCV 88. No reported overt bleeding Monitor H&H.  Hyperglycemia, likely exacerbated by IV steroids Obtain hemoglobin A1c Start insulin sliding scale every 4 hours while NPO N.p.o. until more alert and passes a swallow evaluation  History of recurrent perianal abscess, followed by general surgery Dr. [name] outpatient Local wound care with wound care specialist guidance Analgesics as needed    Critical care time: 65 minutes.     DVT prophylaxis: Subcu heparin 3 times daily  Code Status: Full code by default  Family Communication: None at bedside.  Disposition Plan: Admitted to stepdown unit  Consults called: Medical oncology Dr. Irene Limbo and nephrology Dr. Candiss Norse by EDP.  Admission status: Inpatient status post   Status is: Inpatient The patient requires at least 2 midnights for  further evaluation and treatment of present condition.   Kayleen Memos MD [facility] Pager [phone]  If 7PM-7AM, please contact night-coverage [email or web address] Password [facility]  07/22/2022, 2:52 AM

## 2022-07-22 NOTE — Consult Note (Signed)
Radiation Oncology         [phone]) [phone] ________________________________  Name: James Coffey        MRN: [id number]  Date of Service: 07/22/22 DOB: 10/09/1970  [id number], No Pcp Per     REFERRING PHYSICIAN: Dr. Erlinda Hong   DIAGNOSIS: The primary encounter diagnosis was Brain mass. Diagnoses of AKI (acute kidney injury) ([place]) and Altered mental status, unspecified altered mental status type were also pertinent to this visit.   HISTORY OF PRESENT ILLNESS: James Coffey is a 52 y.o. male seen at the request of Dr. Erlinda Hong with [facility]. The patient is well known to our service with a history of metastatic rectal cancer.  The patient most recently finished a course of ultrahypofractioned body radiation to his lung in December 2023.  He also has had a history of liver disease and recently saw Dr. Kathlene Cote in interventional radiology to discuss Y90.  In the last few days the patient's family has noted some episodes of confusion but overnight developed significant confusion at home where he was disoriented to space and forgetful in conversation.  He went into the kitchen thinking he was in the bathroom, and also had been very frustrated by his confusion.  He was brought in and found to have a 3 cm hyperdense left frontal lobe mass with prominent surrounding edema and mass effect with a 6 mm midline shift to the right on CT.  His lab work showed an elevated creatinine up to 11 on 07/20/2022 and then 13.42 yesterday, with gentle hydration it has come down to 7.49.  Nephrology has been involved in his workup since he arrived in the ER.  The plan is for him to be admitted to [facility] in case he needs urgent dialysis.  We have been asked to see him to consider treatment for his brain disease.  He was started on dexamethasone 6 mg IV every 6 hours.  PREVIOUS RADIATION THERAPY: Yes   05/18/2022 through 05/27/2022 Ultrahypofractionated Radiotherapy Site Technique Total Dose (Gy) Dose per Fx (Gy) Completed Fx  Beam Energies  Lung, Left:  [place] Target IMRT 60/60 7.5 8/8 6XFFF    12/23/21-12/30/21   SBRT Treatment  The tumor in the RUL was treated with a course of stereotactic body radiation treatment. The patient received 54 Gy In 3 fractions at 18 G per fraction.  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   Cancer ([place])    rectal cancer with mets to liver   Tobacco abuse        PAST SURGICAL HISTORY: Past Surgical History:  Procedure Laterality Date   HERNIA REPAIR     INCISION AND DRAINAGE ABSCESS N/A 12/01/2020   Procedure: INCISION AND DRAINAGE ABSCESS;  Surgeon: Michael Boston, MD;  Location: [facility];  Service: General;  Laterality: N/A;   INCISION AND DRAINAGE ABSCESS N/A 12/04/2020   Procedure: INCISION AND DRAINAGE FOURNIERS GANGRENE;  Surgeon: [name], MD;  Location: [facility];  Service: General;  Laterality: N/A;   INCISION AND DRAINAGE PERIRECTAL ABSCESS N/A 11/30/2020   Procedure: INCISION AND DEBRIDEMENT OF PERIRECTAL ABSCESS AND RECTAL WALL BIOPSIES;  Surgeon: Michael Boston, MD;  Location: [facility];  Service: General;  Laterality: N/A;   INGUINAL HERNIA  REPAIR Right 2007   IR IMAGING GUIDED PORT INSERTION  05/07/2021   IR RADIOLOGIST EVAL & MGMT  08/27/2021   IR RADIOLOGIST EVAL & MGMT  09/12/2021   IR RADIOLOGIST EVAL & MGMT  11/10/2021   IR RADIOLOGIST EVAL & MGMT  01/05/2022   IR SINUS/FIST TUBE CHK-NON GI  04/28/2022   LAPAROSCOPIC LOOP COLOSTOMY N/A 12/13/2020   Procedure: DIAGNOSTIC LAPAROSCOPY WITH  OPEN DESCENDING LOOP COLOSTOMY;  Surgeon: Armandina Gemma, MD;  Location: [facility];  Service: General;  Laterality: N/A;   LAPAROSCOPY N/A 08/28/2021   Procedure: LAPAROSCOPY DIAGNOSTIC;  Surgeon: Dwan Bolt,  MD;  Location: [place];  Service: General;  Laterality: N/A;   OPEN PARTIAL HEPATECTOMY  Left 08/28/2021   Procedure: OPEN LEFT LATERAL HEPATECTOMY WITH INTRAOPERATIVE ULTRASOUND;  Surgeon: Dwan Bolt, MD;  Location: [place];  Service: General;  Laterality: Left;   RADIOLOGY WITH ANESTHESIA N/A 10/08/2021   Procedure: MICROWAVE ABLATION;  Surgeon: Aletta Edouard, MD;  Location: [facility];  Service: Radiology;  Laterality: N/A;   RECTAL SURGERY     XI ROBOTIC ASSISTED PARASTOMAL HERNIA REPAIR N/A 02/04/2022   Procedure: ROBOTIC ABDOMINAL PERINEAL RESECTION, PARASTOMAL HERNIA REPAIR, BILATERAL TAP BLOCK;  Surgeon: [name], MD;  Location: [facility];  Service: General;  Laterality: N/A;     FAMILY HISTORY:  Family History  Problem Relation Age of Onset   CAD Neg Hx    Inflammatory bowel disease Neg Hx      SOCIAL HISTORY:  reports that he has been smoking cigarettes. He has been smoking an average of .25 packs per day. He has never used smokeless tobacco. He reports that he does not drink alcohol and does not use drugs.  The patient is married and lives in [place]. His daughter [name] is often present for his visits and assists in being sometimes acting as a medical interpreter because of her nursing education.  His native language is [place] but he does speak [place] as a second language. He is originally from [place]. He drives trucks for a living.    ALLERGIES: Hydrocodone   MEDICATIONS:  Current Facility-Administered Medications  Medication Dose Route Frequency Provider Last Rate Last Admin   0.9 %  sodium chloride infusion   Intravenous Continuous Elmarie Shiley, MD 50 mL/hr at 07/22/22 0546 New Bag at 07/22/22 0546   acetaminophen (TYLENOL) tablet 650 mg  650 mg Oral Q6H PRN Irene Pap N, DO       dexamethasone (DECADRON) injection 6 mg  6 mg Intravenous Q6H Hall, Carole N, DO   6 mg at 07/22/22 0546   heparin injection 5,000 Units  5,000 Units Subcutaneous Q8H Irene Pap N, DO    5,000 Units at 07/22/22 0546   LORazepam (ATIVAN) tablet 0.5 mg  0.5 mg Oral QHS Hall, Carole N, DO       pantoprazole (PROTONIX) injection 40 mg  40 mg Intravenous Q24H Irene Pap N, DO   40 mg at 07/22/22 0547   polyethylene glycol (MIRALAX / GLYCOLAX) packet 17 g  17 g Oral Daily PRN Irene Pap N, DO       prochlorperazine (COMPAZINE) injection 5 mg  5 mg Intravenous Q6H PRN Kayleen Memos, DO       Current Outpatient Medications  Medication Sig Dispense Refill   acetaminophen (TYLENOL) 500 MG tablet Take 2 tablets (1,000 mg total) by mouth every 8 (eight) hours as needed for mild pain or fever. (Patient taking differently: Take 1,000 mg by mouth  as needed for mild pain or fever.)     LORazepam (ATIVAN) 0.5 MG tablet Take 1 tablet (0.5 mg total) by mouth at bedtime. (Patient taking differently: Take 0.5 mg by mouth 3 (three) times daily as needed for anxiety or sleep.) 60 tablet 0   oxyCODONE (OXY IR/ROXICODONE) 5 MG immediate release tablet Take 1 tablet (5 mg total) by mouth every 6 (six) hours as needed for moderate pain or severe pain. (Patient taking differently: Take 5 mg by mouth 3 (three) times daily as needed for moderate pain or severe pain.) 60 tablet 0   polyethylene glycol (MIRALAX) 17 g packet Take 17 g by mouth daily. Take daily to maintain regular soft colostomy output. Can increase to twice a day if need. (Patient taking differently: Take 17 g by mouth daily as needed for mild constipation.) 14 each 0   prochlorperazine (COMPAZINE) 10 MG tablet Take 1 tablet (10 mg total) by mouth every 6 (six) hours as needed for nausea or vomiting. (Patient taking differently: Take 10 mg by mouth 3 (three) times daily as needed for nausea or vomiting.) 30 tablet 0   senna-docusate (SENNA S) 8.6-50 MG tablet Take 2 tablets by mouth at bedtime. (Patient not taking: Reported on 07/22/2022) 60 tablet 1   zolpidem (AMBIEN) 10 MG tablet Take 1 tablet (10 mg total) by mouth at bedtime as needed for  sleep. (Patient not taking: Reported on 07/22/2022) 30 tablet 1   Facility-Administered Medications Ordered in Other Encounters  Medication Dose Route Frequency Provider Last Rate Last Admin   palonosetron (ALOXI) 0.25 MG/5ML injection            sodium chloride flush (NS) 0.9 % injection 10 mL  10 mL Intracatheter Once [name], MD         REVIEW OF SYSTEMS: On review of systems, the patient's daughter whom I spoke with states that he has still been quite confused, he gets frustrated when he feels like people do not understand what he is trying to say but he was disoriented to space and also to time and prior conversation within a few minutes.  No other complaints that she is aware of are verbalized.    PHYSICAL EXAM:  Wt Readings from Last 3 Encounters:  07/20/22 165 lb 9.6 oz (75.1 kg)  07/03/22 169 lb 9.6 oz (76.9 kg)  06/24/22 175 lb (79.4 kg)   Temp Readings from Last 3 Encounters:  07/22/22 97.8 F (36.6 C) (Oral)  07/20/22 97.8 F (36.6 C) (Oral)  07/03/22 (!) 97.5 F (36.4 C)   BP Readings from Last 3 Encounters:  07/22/22 105/80  07/20/22 107/71  07/03/22 116/67   Pulse Readings from Last 3 Encounters:  07/22/22 62  07/20/22 81  07/03/22 99   Pain Assessment Pain Score: 4 /10  Unable to assess given encounter type    ECOG = 1  0 - Asymptomatic (Fully active, able to carry on all predisease activities without restriction)  1 - Symptomatic but completely ambulatory (Restricted in physically strenuous activity but ambulatory and able to carry out work of a light or sedentary nature. For example, light housework, office work)  2 - Symptomatic, <50% in bed during the day (Ambulatory and capable of all self care but unable to carry out any work activities. Up and about more than 50% of waking hours)  3 - Symptomatic, >50% in bed, but not bedbound (Capable of only limited self-care, confined to bed or chair 50% or more of  waking hours)  4 - Bedbound  (Completely disabled. Cannot carry on any self-care. Totally confined to bed or chair)  5 - Death   Eustace Pen MM, Creech RH, Tormey DC, et al. [phone]). "Toxicity and response criteria of the [facility]". [place] Oncol. 5 (6): 649-55    LABORATORY DATA:  Lab Results  Component Value Date   WBC 9.4 07/22/2022   HGB 8.8 (L) 07/22/2022   HCT 28.6 (L) 07/22/2022   MCV 88.0 07/22/2022   PLT 317 07/22/2022   Lab Results  Component Value Date   NA 133 (L) 07/21/2022   K 5.1 07/21/2022   CL 103 07/21/2022   CO2 18 (L) 07/21/2022   Lab Results  Component Value Date   ALT 14 07/21/2022   AST 14 (L) 07/21/2022   ALKPHOS 118 07/21/2022   BILITOT 1.4 (H) 07/21/2022      RADIOGRAPHY: CT ABDOMEN PELVIS WO CONTRAST  Result Date: 07/22/2022 CLINICAL DATA:  Acute nonlocalized abdominal pain. Left-sided abdominal distention. EXAM: CT ABDOMEN AND PELVIS WITHOUT CONTRAST TECHNIQUE: Multidetector CT imaging of the abdomen and pelvis was performed following the standard protocol without IV contrast. RADIATION DOSE REDUCTION: This exam was performed according to the departmental dose-optimization program which includes automated exposure control, adjustment of the mA and/or kV according to patient size and/or use of iterative reconstruction technique. COMPARISON:  07/13/2022 FINDINGS: Lower chest: Motion artifact limits evaluation. Lobulated nodule in the left lingula measuring 1.9 cm diameter, unchanged. This is likely metastatic. Hepatobiliary: Liver lesions are again demonstrated although poorly defined on noncontrast imaging. Largest mass is in the dome of the liver, measuring about 7.8 cm diameter. This corresponds to known liver metastasis. There is limited ability to compare the lesion to the previous contrast-enhanced scan. Partial left hepatic resection. Gallbladder and bile ducts are unremarkable. Pancreas: Unremarkable. No pancreatic ductal dilatation or surrounding  inflammatory changes. Spleen: Normal in size without focal abnormality. Adrenals/Urinary Tract: No adrenal gland nodules. There is mild bilateral hydronephrosis. No renal or ureteral stones are demonstrated. The bladder is distended without wall thickening. Changes likely to represent urine stasis or reflux. Stomach/Bowel: Stomach, small bowel, and colon are not abnormally distended. There is a partial colonic resection with left lower quadrant descending colostomy. There is a large peristomal hernia containing small and large bowel. This appears similar to the prior study. Soft tissue infiltration and fluid in the presacral space with infiltration or scarring into the surrounding perirectal fat. This appearance is similar to the prior study and could represent either or combination of residual/recurrent tumor and postoperative change. Continued attention on follow-up imaging is suggested. Vascular/Lymphatic: Aortic atherosclerosis. No enlarged abdominal or pelvic lymph nodes. Reproductive: Prostate is unremarkable. Other: No free air or free fluid in the abdomen. Musculoskeletal: Degenerative changes in the spine and hips. No destructive bone lesions. IMPRESSION: 1. No significant change since prior study. 2. Again demonstrated are metastatic lesions throughout the liver and in the left lingula. 3. Postoperative changes with partial colectomy. Left lower quadrant ileostomy with large peristomal hernia, similar to prior study. No evidence of proximal obstruction. Gas and soft tissue in the presacral space may represent post treatment changes or residual/recurrent tumor. Follow-up as indicated. 4. Bilateral hydronephrosis and hydroureter with distended bladder, likely representing reflux disease. Bladder distention could be physiologic or due to outlet obstruction or decreased motility. Electronically Signed   By: Lucienne Capers M.D.   On: 07/22/2022 00:11   CT Head Wo Contrast  Result Date: 07/21/2022  CLINICAL  DATA:  Altered mental status EXAM: CT HEAD WITHOUT CONTRAST TECHNIQUE: Contiguous axial images were obtained from the base of the skull through the vertex without intravenous contrast. RADIATION DOSE REDUCTION: This exam was performed according to the departmental dose-optimization program which includes automated exposure control, adjustment of the mA and/or kV according to patient size and/or use of iterative reconstruction technique. COMPARISON:  None Available. FINDINGS: Brain: Hyperdense left frontal lobe mass measuring by 3 x 2.8 x 2.5 cm. Considerable surrounding edema and local mass effect. 6 mm midline shift to the right. Compression of the right frontal horn of left lateral ventricle. Diffuse sulcal swelling on the left consistent with edema Vascular: No hyperdense vessels.  Carotid vascular calcification Skull: Normal. Negative for fracture or focal lesion. Sinuses/Orbits: No acute finding. Other: None IMPRESSION: 3 cm hyperdense left frontal lobe mass with prominent surrounding edema and local mass effect. 6 mm midline shift to the right. Findings favored to represent metastatic disease over primary brain neoplasm given history of metastatic rectal cancer. Critical Value/emergent results were called by telephone at the time of interpretation on 07/21/2022 at 11:31 pm to provider PA Deno Etienne , who verbally acknowledged these results. Electronically Signed   By: Donavan Foil M.D.   On: 07/21/2022 23:35   CT CHEST ABDOMEN PELVIS W CONTRAST  Result Date: 07/13/2022 CLINICAL DATA:  Follow-up metastatic rectal carcinoma. Abdominal pain. Completed chemotherapy. Previous ablation of the hepatic metastasis. Restaging. * Tracking Code: BO * EXAM: CT CHEST, ABDOMEN, AND PELVIS WITH CONTRAST TECHNIQUE: Multidetector CT imaging of the chest, abdomen and pelvis was performed following the standard protocol during bolus administration of intravenous contrast. RADIATION DOSE REDUCTION: This exam was performed  according to the departmental dose-optimization program which includes automated exposure control, adjustment of the mA and/or kV according to patient size and/or use of iterative reconstruction technique. CONTRAST:  138m OMNIPAQUE IOHEXOL 300 MG/ML  SOLN COMPARISON:  Chest CT on 03/20/2022, and AP CT on 06/24/2022 FINDINGS: CT CHEST FINDINGS Cardiovascular: No acute findings. Aortic and coronary atherosclerotic calcification incidentally noted. Mediastinum/Lymph Nodes: No masses or pathologically enlarged lymph nodes identified. Lungs/Pleura: Pulmonary nodule in the lingula measuring 2.3 x 1.7 cm shows no significant change since prior study. Central right upper lobe pulmonary nodule measuring 1.5 x 0.9 cm is also stable since prior study. Upper lobe pleural-parenchymal scarring shows no significant change. No new or enlarging pulmonary nodules or masses identified. No evidence of pleural effusion. Musculoskeletal:  No suspicious bone lesions identified. CT ABDOMEN AND PELVIS FINDINGS Hepatobiliary: Postop changes from left hepatic lobe wedge resection remains stable. No evidence of recurrent mass at this site. Ablation defect in the anterior right hepatic lobe also remains stable, without evidence of recurrence at this site. A large hypovascular mass in the medial liver dome shows mild increase in size, currently measuring 7.6 x 6.1 cm, compared to 6.6 x 5.8 cm previously. Another hypovascular mass in the lateral liver dome currently measures 5.2 x 3.5 cm, compared to 5.0 by 3.3 cm previously. Gallbladder is unremarkable. No evidence of biliary ductal dilatation. Pancreas:  No mass or inflammatory changes. Spleen:  Within normal limits in size and appearance. Adrenals/Urinary tract: No suspicious masses or hydronephrosis. Urinary bladder remains mildly distended, but is unchanged in appearance. Stomach/Bowel: Postop changes again seen from left lower quadrant end colostomy. Large left lower quadrant parastomal  hernia is again seen containing multiple small bowel loops. Fluid and gas collection in the presacral space measures 4.7 x 2.1 cm, without  significant change since prior study. Asymmetric soft tissue prominence along the left lateral inferior margin of this collection remains stable and could be due to tumor or post treatment changes. Vascular/Lymphatic: No pathologically enlarged lymph nodes identified. No acute vascular findings. Aortic atherosclerotic calcification incidentally noted. Reproductive:  No mass or other significant abnormality identified. Other:  None. Musculoskeletal:  No suspicious bone lesions identified. IMPRESSION: Mild increase in size of liver metastases in the medial liver dome. No other new or increased sites of metastatic disease identified. Stable bilateral pulmonary metastases. Stable fluid and gas collection in presacral space. Asymmetric soft tissue prominence along the left lateral inferior margin of this collection also remains stable, and could be due to tumor or post treatment changes. Continued attention on follow-up imaging recommended. Stable large left lower quadrant parastomal hernia containing multiple small bowel loops. Aortic Atherosclerosis (ICD10-I70.0). Electronically Signed   By: Marlaine Hind M.D.   On: 07/13/2022 14:06   CT Abdomen Pelvis W Contrast  Result Date: 06/24/2022 CLINICAL DATA:  Abdominal pain for 3 weeks. Metastatic rectal cancer. Previous abscess EXAM: CT ABDOMEN AND PELVIS WITH CONTRAST TECHNIQUE: Multidetector CT imaging of the abdomen and pelvis was performed using the standard protocol following bolus administration of intravenous contrast. RADIATION DOSE REDUCTION: This exam was performed according to the departmental dose-optimization program which includes automated exposure control, adjustment of the mA and/or kV according to patient size and/or use of iterative reconstruction technique. CONTRAST:  156m OMNIPAQUE IOHEXOL 300 MG/ML  SOLN  COMPARISON:  CT 04/28/2022 and older FINDINGS: Lower chest: Breathing motion at the lung bases. There is some linear opacity seen likely scar or atelectasis. No pleural effusion. There is a mass in the lingula on series 9, image 18 this measures 2.5 by 1.9 cm and previously when measured in the same fashion as today 2.6 by 1.9 cm. Coronary artery calcifications are seen. Please correlate for other coronary risk factors. Hepatobiliary: Liver metastases are identified. At least 3 large lesions are identified. Dome lesion near the margin of the IVC and hepatic vein centrally on the previous examination measured 3.5 x 2.9 cm and today is larger at 6.6 by 5.8 cm. The peripheral lesion at the same level on the previous exam in segment 4 measured 2.7 x 2.1 cm and today on image 15 measures 5.0 by 3.3 cm. Lesion seen more caudal along segment 5 is similar. There is some tiny foci seen in segment 3 as well. Underlying fatty liver infiltration. Gallbladder is nondilated. Patent portal vein. Pancreas: Moderate atrophy of the pancreas without mass lesion is similar to previous. Spleen: Spleen is nonenlarged.  Preserved enhancement. Adrenals/Urinary Tract: The adrenal glands are preserved. No enhancing renal mass. There is a punctate low-density focus medially in the upper pole left kidney measuring 3-4 mm, too small to completely characterize although likely small cysts. Focus in the upper pole of the right kidney medially is also seen and stable from prior measuring maximal diameter of 2.1 cm and Hounsfield unit of 2 consistent with a Bosniak 1 cyst. No specific imaging follow-up recommended. No collecting system dilatation. The ureters have a normal course and caliber down to the bladder. Bladder has a preserved contour but has diffuse mild wall thickening. Stomach/Bowel: Stomach is mildly distended with fluid. The duodenal mass normal course and caliber as does the rest of the small bowel. The exception is a large left-sided  anterior abdominal wall hernia along the margin of the rectus abdominis muscle. Again this is a large hernia with  herniation of mesenteric fat, small bowel parastomal hernia due to the associated colostomy location. The colon proximal to this is nondilated with scattered colonic stool. There are some loops of small bowel in the central pelvis above the bladder which have wall thickening. There is some mesenteric stranding. Please correlate for infectious or inflammatory process. Please correlate for any history of radiation. The wall thickening is increased from previous. Previously there was a pigtail catheter in the fluid collection along the surgical bed along the rectum in the presacral space with mass effect along the prostate in the posterior wall of the bladder. There is some residual gas and fluid in this location today measuring 4.0 by 2.0 cm on series 2, image 87. This is slightly larger. The amount fluid is increased relative to air. The pigtail catheter is no longer seen. Please correlate for history of abscess. Just to the left this is a small area of nodular tissue on series 4, image 16 measuring 19 by 17 mm which is unchanged from previous. There is some simple fluid along the dependent portions of the mesentery in the upper pelvis. No new rim enhancing fluid collections along the peritoneum. Vascular/Lymphatic: Normal caliber aorta and IVC. Scattered partially calcified plaque along the aorta and iliac vessels. No specific abnormal lymph node enlargement seen in the abdomen or pelvis. Reproductive: Ill-defined margins of the prostate with the adjacent inflammatory process. Other: None Musculoskeletal: Scattered degenerative changes along the spine with some disc bulging particularly at L4-5 and L5-S1. Degenerative changes along the pelvis. Small sclerotic area along the left femoral head greater than right is again noted and may represent bone islands. IMPRESSION: Interval removal of the pigtail  catheter in the presacral space. The fluid collection this location is similar in size to previous but the amount of fluid in the collection is increased compared to previous. Please correlate for abscess. There is adjacent extensive presacral fat stranding. Small amount of ill-defined fluid in the dependent mesentery of the upper pelvis and lower abdomen without additional rim enhancing fluid collections. Progression of size of multiple liver metastases. Stable lingular nodule. Persistent soft tissue nodule in the low pelvis just to the left anterior to the fluid collection in the presacral space and posterior bladder. This could represent an area of neoplasm. Increasing wall thickening along loops of small bowel in the central pelvis. This could be infectious or inflammatory. Please correlate for any history of pelvic radiation. Once again there is a large peristomal hernia along the left side adjacent to the colostomy involving mesentery fat and small bowel without obstruction. This is a wide mouth hernia. Persistent wall thickening of the urinary bladder. There is ill-defined margins of the prostate with some soft tissue thickening and stranding. Electronically Signed   By: Jill Side M.D.   On: 06/24/2022 12:17       IMPRESSION/PLAN: 1. 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  The patient as above is well-known to our service.  Will be proceeding with reassessment with his medical oncology team and IR team.  Regarding his right these we would like to proceed with an MRI with SRS and BrainLab technique however his current creatinine from acute kidney injury and bilateral hydronephrosis is limiting the comfort of [place] being able to order this test.  We will follow his GFR and his creatinine levels and appreciate nephrology's input as well, ideally we would like to see his GFR closer to 30 before an MRI scan.  I shared  this information with his daughter and she is in agreement  with this plan.  For now he will continue to get steroids and should have some improvement in his clinical symptoms of confusion.  Once we have additional imaging we will review his case with neurosurgery to determine if he is a candidate for SRS, and if so would this be with or without surgery.  Up until this point his performance status has been excellent despite his metastatic disease.  We would recommend changing his steroid dose to 4 mg of dexamethasone 3 times a day.  I will speak with his daughter in the coming days once we are able to proceed with imaging. 2. Acute kidney injury with bilateral hydronephrosis.  The patient will be followed by nephrology and his hospitalist team.  We appreciate their input and will continue to follow this so that we can order scans as above.  In a visit lasting 60 minutes, greater than 50% of the time was spent by phone and in floor time discussing the patient's condition, in preparation for the discussion, and coordinating the patient's care.       Carola Rhine, [facility]   **Disclaimer: This note was dictated with voice recognition software. Similar sounding words can inadvertently be transcribed and this note may contain transcription errors which may not have been corrected upon publication of note.**

## 2022-07-22 NOTE — Consult Note (Signed)
Reason for Consult: Acute kidney injury Referring Physician: Florencia Reasons MD [facility])  HPI:  52 year old man with past medical history significant for oligometastatic rectal cancer status post loop colostomy and chemotherapy with liver metastasis, bilateral pulmonary metastasis and recurrent perianal/perineal abscesses.  He was brought to the emergency room yesterday by EMS with concerns of altered mental status and decreased oral intake for about 2 days prior to presentation.  Labs done in the emergency room were significant for acute kidney injury with creatinine of 13.4 up from 0.8 on 06/24/2022 with hyponatremia, metabolic acidosis and azotemia.  Additional evaluation in the emergency room showed a hyperdense left frontal lobe mass with surrounding edema/local mass effect representing metastatic disease.  CT scan of the abdomen and pelvis (noncontrast) showed bilateral hydronephrosis/hydroureter with a distended bladder following which a Foley catheter was placed yielding 1.3 L urine output.  Follow-up creatinine about 6 hours after placement of Foley catheter showed downtrending creatinine at 7.5.  Urine output so far charted is 2.9 L with at least 1.1 L in his urine bag.  He denies any prior history of kidney injury and denies any urinary complaints prior to hospitalization.  He denies any increased ostomy output and reports some mild nausea prior to admission.  He denies any chest pain or shortness of breath when seen in the emergency room.  Earlier received intravenous Decadron to help reduce intracerebral edema.  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   Cancer ([place])    rectal cancer with mets to liver   Tobacco abuse     Past Surgical History:  Procedure Laterality Date   HERNIA REPAIR     INCISION AND DRAINAGE ABSCESS N/A 12/01/2020   Procedure: INCISION AND DRAINAGE ABSCESS;  Surgeon: Michael Boston, MD;  Location: [facility];  Service: General;   Laterality: N/A;   INCISION AND DRAINAGE ABSCESS N/A 12/04/2020   Procedure: INCISION AND DRAINAGE FOURNIERS GANGRENE;  Surgeon: [name], MD;  Location: [facility];  Service: General;  Laterality: N/A;   INCISION AND DRAINAGE PERIRECTAL ABSCESS N/A 11/30/2020   Procedure: INCISION AND DEBRIDEMENT OF PERIRECTAL ABSCESS AND RECTAL WALL BIOPSIES;  Surgeon: Michael Boston, MD;  Location: [facility];  Service: General;  Laterality: N/A;   INGUINAL HERNIA REPAIR Right 2007   IR IMAGING GUIDED PORT INSERTION  05/07/2021   IR RADIOLOGIST EVAL & MGMT  08/27/2021   IR RADIOLOGIST EVAL & MGMT  09/12/2021   IR RADIOLOGIST EVAL & MGMT  11/10/2021   IR RADIOLOGIST EVAL & MGMT  01/05/2022   IR SINUS/FIST TUBE CHK-NON GI  04/28/2022   LAPAROSCOPIC LOOP COLOSTOMY N/A 12/13/2020   Procedure: DIAGNOSTIC LAPAROSCOPY WITH  OPEN DESCENDING LOOP COLOSTOMY;  Surgeon: Armandina Gemma, MD;  Location: [facility];  Service: General;  Laterality: N/A;   LAPAROSCOPY N/A 08/28/2021   Procedure: LAPAROSCOPY DIAGNOSTIC;  Surgeon: Dwan Bolt, MD;  Location: [place];  Service: General;  Laterality: N/A;   OPEN PARTIAL HEPATECTOMY  Left 08/28/2021   Procedure: OPEN LEFT LATERAL HEPATECTOMY WITH INTRAOPERATIVE ULTRASOUND;  Surgeon: Dwan Bolt, MD;  Location: [place];  Service: General;  Laterality: Left;   RADIOLOGY WITH ANESTHESIA N/A 10/08/2021   Procedure: MICROWAVE ABLATION;  Surgeon: Aletta Edouard, MD;  Location[facility];  Service: Radiology;  Laterality: N/A;   RECTAL SURGERY     XI ROBOTIC ASSISTED PARASTOMAL HERNIA REPAIR N/A 02/04/2022   Procedure: ROBOTIC ABDOMINAL PERINEAL RESECTION, PARASTOMAL HERNIA REPAIR, BILATERAL TAP BLOCK;  Surgeon: [name],  Elmo Putt, MD;  Location: [facility];  Service: General;  Laterality: N/A;    Family History  Problem Relation Age of Onset   CAD Neg Hx    Inflammatory bowel disease Neg Hx     Social History:  reports that he has been smoking cigarettes. He has been smoking an average of .25 packs per  day. He has never used smokeless tobacco. He reports that he does not drink alcohol and does not use drugs.  Allergies:  Allergies  Allergen Reactions   Hydrocodone Itching    Medications: I have reviewed the patient's current medications. Scheduled:  dexamethasone (DECADRON) injection  6 mg Intravenous Q6H   heparin  5,000 Units Subcutaneous Q8H   LORazepam  0.5 mg Oral QHS   pantoprazole (PROTONIX) IV  40 mg Intravenous Q24H   Continuous:  sodium chloride 50 mL/hr at 07/22/22 0546      Latest Ref Rng & Units 07/22/2022    4:08 AM 07/21/2022   10:26 PM 07/20/2022    9:10 AM  BMP  Glucose 70 - 99 mg/dL  147  160   BUN 6 - 20 mg/dL  68  50   Creatinine 0.61 - 1.24 mg/dL 7.49  13.42  11.07   Sodium 135 - 145 mmol/L  133  138   Potassium 3.5 - 5.1 mmol/L  5.1  5.0   Chloride 98 - 111 mmol/L  103  105   CO2 22 - 32 mmol/L  18  21   Calcium 8.9 - 10.3 mg/dL  8.6  9.4       Latest Ref Rng & Units 07/22/2022    4:08 AM 07/21/2022   10:26 PM 07/20/2022    9:10 AM  CBC  WBC 4.0 - 10.5 K/uL 9.4  11.1  9.9   Hemoglobin 13.0 - 17.0 g/dL 8.8  8.3  8.8   Hematocrit 39.0 - 52.0 % 28.6  26.9  28.0   Platelets 150 - 400 K/uL 317  308  313    Urinalysis    Component Value Date/Time   COLORURINE YELLOW 07/22/2022 0046   APPEARANCEUR CLEAR 07/22/2022 0046   LABSPEC 1.010 07/22/2022 0046   PHURINE 6.0 07/22/2022 0046   GLUCOSEU NEGATIVE 07/22/2022 0046   HGBUR NEGATIVE 07/22/2022 0046   BILIRUBINUR NEGATIVE 07/22/2022 0046   KETONESUR NEGATIVE 07/22/2022 0046   PROTEINUR NEGATIVE 07/22/2022 0046   NITRITE NEGATIVE 07/22/2022 0046   LEUKOCYTESUR NEGATIVE 07/22/2022 0046    CT ABDOMEN PELVIS WO CONTRAST  Result Date: 07/22/2022 CLINICAL DATA:  Acute nonlocalized abdominal pain. Left-sided abdominal distention. EXAM: CT ABDOMEN AND PELVIS WITHOUT CONTRAST TECHNIQUE: Multidetector CT imaging of the abdomen and pelvis was performed following the standard protocol without IV contrast.  RADIATION DOSE REDUCTION: This exam was performed according to the departmental dose-optimization program which includes automated exposure control, adjustment of the mA and/or kV according to patient size and/or use of iterative reconstruction technique. COMPARISON:  07/13/2022 FINDINGS: Lower chest: Motion artifact limits evaluation. Lobulated nodule in the left lingula measuring 1.9 cm diameter, unchanged. This is likely metastatic. Hepatobiliary: Liver lesions are again demonstrated although poorly defined on noncontrast imaging. Largest mass is in the dome of the liver, measuring about 7.8 cm diameter. This corresponds to known liver metastasis. There is limited ability to compare the lesion to the previous contrast-enhanced scan. Partial left hepatic resection. Gallbladder and bile ducts are unremarkable. Pancreas: Unremarkable. No pancreatic ductal dilatation or surrounding inflammatory changes. Spleen: Normal in size without focal abnormality. Adrenals/Urinary  Tract: No adrenal gland nodules. There is mild bilateral hydronephrosis. No renal or ureteral stones are demonstrated. The bladder is distended without wall thickening. Changes likely to represent urine stasis or reflux. Stomach/Bowel: Stomach, small bowel, and colon are not abnormally distended. There is a partial colonic resection with left lower quadrant descending colostomy. There is a large peristomal hernia containing small and large bowel. This appears similar to the prior study. Soft tissue infiltration and fluid in the presacral space with infiltration or scarring into the surrounding perirectal fat. This appearance is similar to the prior study and could represent either or combination of residual/recurrent tumor and postoperative change. Continued attention on follow-up imaging is suggested. Vascular/Lymphatic: Aortic atherosclerosis. No enlarged abdominal or pelvic lymph nodes. Reproductive: Prostate is unremarkable. Other: No free air or  free fluid in the abdomen. Musculoskeletal: Degenerative changes in the spine and hips. No destructive bone lesions. IMPRESSION: 1. No significant change since prior study. 2. Again demonstrated are metastatic lesions throughout the liver and in the left lingula. 3. Postoperative changes with partial colectomy. Left lower quadrant ileostomy with large peristomal hernia, similar to prior study. No evidence of proximal obstruction. Gas and soft tissue in the presacral space may represent post treatment changes or residual/recurrent tumor. Follow-up as indicated. 4. Bilateral hydronephrosis and hydroureter with distended bladder, likely representing reflux disease. Bladder distention could be physiologic or due to outlet obstruction or decreased motility. Electronically Signed   By: Lucienne Capers M.D.   On: 07/22/2022 00:11   CT Head Wo Contrast  Result Date: 07/21/2022 CLINICAL DATA:  Altered mental status EXAM: CT HEAD WITHOUT CONTRAST TECHNIQUE: Contiguous axial images were obtained from the base of the skull through the vertex without intravenous contrast. RADIATION DOSE REDUCTION: This exam was performed according to the departmental dose-optimization program which includes automated exposure control, adjustment of the mA and/or kV according to patient size and/or use of iterative reconstruction technique. COMPARISON:  None Available. FINDINGS: Brain: Hyperdense left frontal lobe mass measuring by 3 x 2.8 x 2.5 cm. Considerable surrounding edema and local mass effect. 6 mm midline shift to the right. Compression of the right frontal horn of left lateral ventricle. Diffuse sulcal swelling on the left consistent with edema Vascular: No hyperdense vessels.  Carotid vascular calcification Skull: Normal. Negative for fracture or focal lesion. Sinuses/Orbits: No acute finding. Other: None IMPRESSION: 3 cm hyperdense left frontal lobe mass with prominent surrounding edema and local mass effect. 6 mm midline shift  to the right. Findings favored to represent metastatic disease over primary brain neoplasm given history of metastatic rectal cancer. Critical Value/emergent results were called by telephone at the time of interpretation on 07/21/2022 at 11:31 pm to provider PA Deno Etienne , who verbally acknowledged these results. Electronically Signed   By: Donavan Foil M.D.   On: 07/21/2022 23:35    Review of Systems  Constitutional:  Positive for activity change and fatigue. Negative for chills and fever.  HENT:  Negative for sinus pressure, sinus pain and trouble swallowing.   Eyes:  Negative for photophobia and visual disturbance.  Respiratory:  Negative for chest tightness, shortness of breath and wheezing.   Cardiovascular:  Negative for chest pain and leg swelling.  Gastrointestinal:  Positive for nausea. Negative for blood in stool and vomiting.  Genitourinary:  Positive for difficulty urinating. Negative for dysuria, hematuria and urgency.  Musculoskeletal:  Negative for back pain and myalgias.   Blood pressure 105/72, pulse 60, temperature 97.9 F (36.6 C), resp. rate  16, SpO2 94 %. Physical Exam Vitals and nursing note reviewed.  Constitutional:      Appearance: Normal appearance. He is normal weight. He is not ill-appearing or toxic-appearing.  HENT:     Head: Normocephalic and atraumatic.     Right Ear: External ear normal.     Left Ear: External ear normal.     Nose: Nose normal. No congestion.     Mouth/Throat:     Mouth: Mucous membranes are dry.     Pharynx: Oropharynx is clear.  Eyes:     General: No scleral icterus.    Extraocular Movements: Extraocular movements intact.     Conjunctiva/sclera: Conjunctivae normal.  Cardiovascular:     Rate and Rhythm: Normal rate and regular rhythm.     Pulses: Normal pulses.     Heart sounds: Normal heart sounds.     Comments: Right chest Port-A-Cath Pulmonary:     Effort: Pulmonary effort is normal.     Breath sounds: Normal breath  sounds. No wheezing or rales.  Abdominal:     General: Bowel sounds are normal.     Palpations: Abdomen is soft.     Comments: Left lower quadrant ostomy site pink/healthy with intact colostomy bag.  Exploratory laparotomy scar noted.  Genitourinary:    Comments: Foley catheter in situ Musculoskeletal:     Cervical back: Normal range of motion and neck supple.     Right lower leg: No edema.     Left lower leg: No edema.  Skin:    General: Skin is warm and dry.     Coloration: Skin is pale.  Neurological:     Mental Status: He is alert.     Assessment/Plan: 1.  Acute kidney injury: This appears to be largely postrenal from urine outflow obstruction and anticipate will improve further status post alleviation of this with placement of a Foley catheter.  It is likely that he had some superimposed prerenal injury from decreased oral intake with his deteriorating mental status and he is on gentle intravenous fluids at this time.  We may be able to change plans for possible transfer to [facility] based on labs from this afternoon if creatinine/renal function continues to show improvement.  With his metastatic malignancy that appears to be on a noncurative track, it would be best to avoid chronic dialysis. 2.  Anion gap metabolic acidosis: Secondary to acute kidney injury, monitor with alleviation of urine outflow obstruction/renal recovery. 3.  Hyponatremia: Secondary to acute kidney injury and impaired free water excretion.  Monitor with renal recovery.  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 4.  Metastatic rectal cancer: Management per oncology. 5.  Altered mental status: Likely from a combination of his acute kidney injury/azotemia and more prominently newly discovered brain metastasis.  Started on Decadron to help reduce brain edema and will monitor with renal recovery.  Hollis Tuller K. 07/22/2022, 12:15 PM

## 2022-07-22 NOTE — Consult Note (Signed)
Consultation Note Date: 07/22/2022   Patient Name: James Coffey  DOB: July 05, 1970  MRN: [id number]  Age / Sex: 52 y.o., male  PCP: Patient, No Pcp Per Referring Physician: Florencia Reasons, MD  Reason for Consultation:  "metastatic rectal cancer with new brain mets, aki, bilateral hydro"  HPI/Patient Profile: 52 y.o. male  with past medical history of rectal cancer dx in 2022 with mets to liver and lungs, s/p surgery, colostomy, on chemotherapy, necrotizing fasciitis of the peritoneum, iron deficiency anemia admitted on 07/21/2022 with altered mental status, confusion. Workup reveals new large brain mass with vasogenic edema and midline shift, acute kidney injury d/t outlet obstruction- cr was up to 13.42 but is improving- down to 7.49 now with foley placement. Radiation consulted and plan to offer SRS treatment once Cr and GFR have improved and patient can receive contrast.  Palliative consulted for above.   Primary Decision Maker PATIENT- NOK is spouse  Discussion: Chart reviewed including labs, progress notes, imaging from this and previous encounters. Case also discussed with attending and consultants.  Patient awake and alert. Mildly confused. No family at bedside.  Per chart review prior to admission had excellent performance status.  Called patient's daughter- per chart review she is in nursing school and participates in patient's care.  She has spoken with medical team, no questions for now. Agrees with plan.    SUMMARY OF RECOMMENDATIONS -Plan to meet with patient, spouse, and daughter tomorrow at bedside at 3pm -Full code, full scope    Code Status/Advance Care Planning: Full code   Prognosis:   Unable to determine  Discharge Planning: To Be Determined  Primary Diagnoses: Present on Admission:  AMS (altered mental status)   Review of Systems  Unable to perform ROS: Mental status change     Physical Exam Vitals and nursing note reviewed.  Constitutional:      Comments: Thin  Neurological:     Mental Status: He is alert.     Vital Signs: BP 105/80 (BP Location: Right Arm)   Pulse 62   Temp 97.8 F (36.6 C) (Oral)   Resp 15   SpO2 94%  Pain Scale: 0-10   Pain Score: 4    SpO2: SpO2: 94 % O2 Device:SpO2: 94 % O2 Flow Rate: .   IO: Intake/output summary:  Intake/Output Summary (Last 24 hours) at 07/22/2022 1353 Last data filed at 07/22/2022 0554 Gross per 24 hour  Intake 363.03 ml  Output 2900 ml  Net -2536.97 ml    LBM:   Baseline Weight:   Most recent weight:         Thank you for this consult. Palliative medicine will continue to follow and assist as needed.   Greater than 50%  of this time was spent counseling and coordinating care related to the above assessment and plan.  Signed by: Mariana Kaufman, AGNP-C Palliative Medicine    Please contact Palliative Medicine Team phone at [phone] for questions and concerns.  For individual provider: See Shea Evans

## 2022-07-22 NOTE — Consult Note (Addendum)
[facility] Nurse Consult Note: Reason for Consult: Consult requested for previous perineal abscess site.  He was followed by the surgical team during a previous admission for necrotizing fascitis and is   scheduled for a follow-up appointment with Dr [name] on 2/27; their team has ordered to tuck gauze into the affected area daily. I assessed near the rectum and was unable to locate the open wound, but patient has mod amt yellow drainage from near the rectum/gluteal fold.  Dressing procedure/placement/frequency: Topical treatment orders provided for the bedside nurses to perform as follows to absorb drainage: Tuck small piece of gauze in gluteal fold near rectum and change Q day or PRN when soiled Pt should follow-up with Dr [name] as planned after discharge.   Pt has a colostomy which has been present since 7/22.  He states he and his wife perform ostomy pouch changes and emptying prior to admission.  Current pouch is intact with good seal, small amt tan liquid stool, stoma is red and viable.  He uses a 2 piece pouching system and does not use a barrier ring. There is a visible peristomal hernia surrounding the pouch. Extra set of supplies left at the bedside for staff nurses: Use supplies: wafer Kellie Simmering # 2 pouch Kellie Simmering # [id number]  Please re-consult if further assistance is needed.  Thank-you,  Julien Girt MSN, [place], [place], [place], [place]

## 2022-07-22 NOTE — ED Notes (Signed)
Daughter at bedside updated on plan. Pt reports being comfortable and is alert. No needs at this time.

## 2022-07-22 NOTE — ED Notes (Signed)
Patient denies pain and is resting comfortably.  

## 2022-07-22 NOTE — Progress Notes (Signed)
Informed of patient in [facility] ER. H/O metastatic colorectal Ca, p/w AMS. Found to have mets to brain on CTH. B/l Cr ~0.8, up to 11.07 on 2/12, now 13.4. B/l Hydronephrosis on CT abd with bladder distension. Agree with foley placement and isotonics fluids. His AKI is likely secondary to urinary retention and possibly prerenal injury. Would recommend transfer to [facility] at this time. If fails with supportive care, may need RRT but would recommend addressing GOC/palliative care first before consideration of RRT given noncurative metastatic Ca. Will see patient once he is at [facility]. Discussed with ER provider. Please call with any questions/concerns in the meantime.  Gean Quint, MD [facility]

## 2022-07-22 NOTE — Progress Notes (Signed)
[place]   [phone] [phone]   Inpatient medical oncology consult note   Patient Care Team: Patient, No Pcp Per as PCP - General (General Practice) [name], MD as Consulting Physician (Urology) [name], MD as Consulting Physician (Gastroenterology) [name], MD as Consulting Physician (Colon and Rectal Surgery) [name], MD as Consulting Physician (Oncology)  Date of Service:07/22/22   SUBJECTIVE  James Coffey is a 52 y.o. male with a history of metastatic rectal carcinoma status post extensive interventions including aggressive neoadjuvant chemotherapy followed by chemoradiation to the primary tumor in the rectum, liver directed therapies for liver metastases and resection of his rectal primary. He was recently seen in oncology clinic and was noted to have progression of his liver metastases. Labs done in the oncology clinic showed acute increase in his creatinine to 11 and we have called him and his daughter to go to the emergency room.  Patient initially was hesitant to go to the emergency room.  Subsequently was noted to be somewhat confused by his sister and eventually agreed to go to the emergency room.  In the emergency room today he was noted to have a creatinine of 13.  CT head done to evaluate his altered mental status showed 3 cm hyperdense left frontal lobe mass with prominent surrounding edema and local mass effect.  6 mm midline shift to the right.  He had a urinary catheter placed which showed significant urinary retention of nearly a liter and a half of urine.  His creatinine was rapidly coming down after catheter placement and nephrology has been following.  Based on the recommendations the patient was started on steroids and radiation oncology was consulted for consideration of SRS for his isolated brain metastases likely from his rectal cancer.  MRI of the brain was being planned to plan his  brain radiation therapy though this had to be delayed on account of his renal function.  Patient notes he is feeling much better after starting on steroids.  [name] is at bedside and notes that he is a little brighter but still seems to be confused and his speech. Patient is in good spirits and is happy that he got to see his daughter getting married and is looking forward to attending her graduation from nursing school this may.  I had a detailed goals of care discussion with the patient and his daughter at bedside they understand the current plan of care and are okay with proceeding with treatment for his brain lesion and supportive treatments for his renal insufficiency that appears to be a combination of mainly obstructive uropathy plus some element of dehydration.  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   Cancer ([place])    rectal cancer with mets to liver   Tobacco abuse     SURGICAL HISTORY: Past Surgical History:  Procedure Laterality Date   HERNIA REPAIR     INCISION AND DRAINAGE ABSCESS N/A 12/01/2020   Procedure: INCISION AND DRAINAGE ABSCESS;  Surgeon: Michael Boston, MD;  Location: [facility];  Service: General;  Laterality: N/A;   INCISION AND DRAINAGE ABSCESS N/A 12/04/2020   Procedure: INCISION AND DRAINAGE FOURNIERS GANGRENE;  Surgeon: [name], MD;  Location: [facility];  Service: General;  Laterality: N/A;   INCISION AND DRAINAGE PERIRECTAL ABSCESS N/A 11/30/2020   Procedure: INCISION AND DEBRIDEMENT OF PERIRECTAL ABSCESS AND RECTAL WALL BIOPSIES;  Surgeon: Michael Boston, MD;  Location:  [facility];  Service: General;  Laterality: N/A;   INGUINAL HERNIA REPAIR Right 2007   IR IMAGING GUIDED PORT INSERTION  05/07/2021   IR RADIOLOGIST EVAL & MGMT  08/27/2021   IR RADIOLOGIST EVAL & MGMT  09/12/2021   IR RADIOLOGIST EVAL & MGMT  11/10/2021   IR RADIOLOGIST EVAL & MGMT  01/05/2022   IR SINUS/FIST TUBE CHK-NON  GI  04/28/2022   LAPAROSCOPIC LOOP COLOSTOMY N/A 12/13/2020   Procedure: DIAGNOSTIC LAPAROSCOPY WITH  OPEN DESCENDING LOOP COLOSTOMY;  Surgeon: Armandina Gemma, MD;  Location: [facility];  Service: General;  Laterality: N/A;   LAPAROSCOPY N/A 08/28/2021   Procedure: LAPAROSCOPY DIAGNOSTIC;  Surgeon: Dwan Bolt, MD;  Location: [facility];  Service: General;  Laterality: N/A;   OPEN PARTIAL HEPATECTOMY  Left 08/28/2021   Procedure: OPEN LEFT LATERAL HEPATECTOMY WITH INTRAOPERATIVE ULTRASOUND;  Surgeon: Dwan Bolt, MD;  Location: [facility];  Service: General;  Laterality: Left;   RADIOLOGY WITH ANESTHESIA N/A 10/08/2021   Procedure: MICROWAVE ABLATION;  Surgeon: Aletta Edouard, MD;  Location: [facility];  Service: Radiology;  Laterality: N/A;   RECTAL SURGERY     XI ROBOTIC ASSISTED PARASTOMAL HERNIA REPAIR N/A 02/04/2022   Procedure: ROBOTIC ABDOMINAL PERINEAL RESECTION, PARASTOMAL HERNIA REPAIR, BILATERAL TAP BLOCK;  Surgeon: [name], MD;  Location: [facility];  Service: General;  Laterality: N/A;    ALLERGIES:  is allergic to hydrocodone.  ROS  10 Point review of Systems was done is negative except as noted above.   MEDICATIONS:  Current Facility-Administered Medications  Medication Dose Route Frequency Provider Last Rate Last Admin   0.9 %  sodium chloride infusion   Intravenous Continuous Elmarie Shiley, MD 75 mL/hr at 07/22/22 1303 Rate Change at 07/22/22 1303   acetaminophen (TYLENOL) tablet 650 mg  650 mg Oral Q6H PRN Irene Pap N, DO       dexamethasone (DECADRON) injection 6 mg  6 mg Intravenous Q6H Hall, Carole N, DO   6 mg at 07/22/22 1917   heparin injection 5,000 Units  5,000 Units Subcutaneous Q8H Irene Pap N, DO   5,000 Units at 07/22/22 2126   LORazepam (ATIVAN) tablet 0.5 mg  0.5 mg Oral QHS Irene Pap N, DO   0.5 mg at 07/22/22 2126   pantoprazole (PROTONIX) injection 40 mg  40 mg Intravenous Q24H Irene Pap N, DO   40 mg at 07/22/22 0547   polyethylene glycol (MIRALAX /  GLYCOLAX) packet 17 g  17 g Oral Daily PRN Irene Pap N, DO       prochlorperazine (COMPAZINE) injection 5 mg  5 mg Intravenous Q6H PRN Kayleen Memos, DO       Current Outpatient Medications  Medication Sig Dispense Refill   acetaminophen (TYLENOL) 500 MG tablet Take 2 tablets (1,000 mg total) by mouth every 8 (eight) hours as needed for mild pain or fever. (Patient taking differently: Take 1,000 mg by mouth as needed for mild pain or fever.)     LORazepam (ATIVAN) 0.5 MG tablet Take 1 tablet (0.5 mg total) by mouth at bedtime. (Patient taking differently: Take 0.5 mg by mouth 3 (three) times daily as needed for anxiety or sleep.) 60 tablet 0   oxyCODONE (OXY IR/ROXICODONE) 5 MG immediate release tablet Take 1 tablet (5 mg total) by mouth every 6 (six) hours as needed for moderate pain or severe pain. (Patient taking differently: Take 5 mg by mouth 3 (three) times daily as needed for moderate pain or severe pain.)  60 tablet 0   polyethylene glycol (MIRALAX) 17 g packet Take 17 g by mouth daily. Take daily to maintain regular soft colostomy output. Can increase to twice a day if need. (Patient taking differently: Take 17 g by mouth daily as needed for mild constipation.) 14 each 0   prochlorperazine (COMPAZINE) 10 MG tablet Take 1 tablet (10 mg total) by mouth every 6 (six) hours as needed for nausea or vomiting. (Patient taking differently: Take 10 mg by mouth 3 (three) times daily as needed for nausea or vomiting.) 30 tablet 0   senna-docusate (SENNA S) 8.6-50 MG tablet Take 2 tablets by mouth at bedtime. (Patient not taking: Reported on 07/22/2022) 60 tablet 1   zolpidem (AMBIEN) 10 MG tablet Take 1 tablet (10 mg total) by mouth at bedtime as needed for sleep. (Patient not taking: Reported on 07/22/2022) 30 tablet 1   Facility-Administered Medications Ordered in Other Encounters  Medication Dose Route Frequency Provider Last Rate Last Admin   palonosetron (ALOXI) 0.25 MG/5ML injection             sodium chloride flush (NS) 0.9 % injection 10 mL  10 mL Intracatheter Once [name], MD        PHYSICAL EXAMINATION: ECOG PERFORMANCE STATUS: 1 - Symptomatic but completely ambulatory  Vital signs reviewed Wt Readings from Last 3 Encounters:  07/20/22 165 lb 9.6 oz (75.1 kg)  07/03/22 169 lb 9.6 oz (76.9 kg)  06/24/22 175 lb (79.4 kg)  . GENERAL:alert, in no acute distress and comfortable SKIN: no acute rashes, no significant lesions EYES: conjunctiva are pink and non-injected, sclera anicteric OROPHARYNX: MMM, no exudates, no oropharyngeal erythema or ulceration NECK: supple, no JVD LYMPH:  no palpable lymphadenopathy in the cervical, axillary or inguinal regions LUNGS: clear to auscultation b/l with normal respiratory effort HEART: regular rate & rhythm ABDOMEN:  normoactive bowel sounds , non tender, not distended. Extremity: no pedal edema PSYCH: alert & oriented x 3 , intermittently confused speech per daughter. NEURO: no focal motor/sensory deficits   LABORATORY DATA:  .    Latest Ref Rng & Units 07/22/2022    4:08 AM 07/21/2022   10:26 PM 07/20/2022    9:10 AM  CBC  WBC 4.0 - 10.5 K/uL 9.4  11.1  9.9   Hemoglobin 13.0 - 17.0 g/dL 8.8  8.3  8.8   Hematocrit 39.0 - 52.0 % 28.6  26.9  28.0   Platelets 150 - 400 K/uL 317  308  313    .    Latest Ref Rng & Units 07/22/2022    2:44 PM 07/22/2022    4:08 AM 07/21/2022   10:26 PM  CMP  Glucose 70 - 99 mg/dL 215   147   BUN 6 - 20 mg/dL 30   68   Creatinine 0.61 - 1.24 mg/dL 2.46  7.49  13.42   Sodium 135 - 145 mmol/L 140   133   Potassium 3.5 - 5.1 mmol/L 4.9   5.1   Chloride 98 - 111 mmol/L 108   103   CO2 22 - 32 mmol/L 22   18   Calcium 8.9 - 10.3 mg/dL 9.1   8.6   Total Protein 6.5 - 8.1 g/dL   7.6   Total Bilirubin 0.3 - 1.2 mg/dL   1.4   Alkaline Phos 38 - 126 U/L   118   AST 15 - 41 U/L   14   ALT 0 - 44 U/L  14    Lab Results  Component Value Date   FERRITIN 66 06/02/2022    SURGICAL  PATHOLOGY   THIS IS AN ADDENDUM REPORT   CASE: [id number]  PATIENT: [facility]  Surgical Pathology Report  Addendum    Reason for Addendum #1:  DNA Mismatch Repair IHC Results   Clinical History: Fournier's Gangrene; Cancer ? ([facility])      FINAL MICROSCOPIC DIAGNOSIS:   A. IRRIGATION AND DEBRIDEMENT OF SCROTUM AND PENIS:  Acute necrotizing inflammation extensively involving fascia    B. Rectal wall biopsies:  Adenocarcinoma of the rectum        RADIOGRAPHIC STUDIES: I have personally reviewed the radiological images as listed and agreed with the findings in the report. CT ABDOMEN PELVIS WO CONTRAST  Result Date: 07/22/2022 CLINICAL DATA:  Acute nonlocalized abdominal pain. Left-sided abdominal distention. EXAM: CT ABDOMEN AND PELVIS WITHOUT CONTRAST TECHNIQUE: Multidetector CT imaging of the abdomen and pelvis was performed following the standard protocol without IV contrast. RADIATION DOSE REDUCTION: This exam was performed according to the departmental dose-optimization program which includes automated exposure control, adjustment of the mA and/or kV according to patient size and/or use of iterative reconstruction technique. COMPARISON:  07/13/2022 FINDINGS: Lower chest: Motion artifact limits evaluation. Lobulated nodule in the left lingula measuring 1.9 cm diameter, unchanged. This is likely metastatic. Hepatobiliary: Liver lesions are again demonstrated although poorly defined on noncontrast imaging. Largest mass is in the dome of the liver, measuring about 7.8 cm diameter. This corresponds to known liver metastasis. There is limited ability to compare the lesion to the previous contrast-enhanced scan. Partial left hepatic resection. Gallbladder and bile ducts are unremarkable. Pancreas: Unremarkable. No pancreatic ductal dilatation or surrounding inflammatory changes. Spleen: Normal in size without focal abnormality. Adrenals/Urinary Tract: No adrenal gland nodules. There is  mild bilateral hydronephrosis. No renal or ureteral stones are demonstrated. The bladder is distended without wall thickening. Changes likely to represent urine stasis or reflux. Stomach/Bowel: Stomach, small bowel, and colon are not abnormally distended. There is a partial colonic resection with left lower quadrant descending colostomy. There is a large peristomal hernia containing small and large bowel. This appears similar to the prior study. Soft tissue infiltration and fluid in the presacral space with infiltration or scarring into the surrounding perirectal fat. This appearance is similar to the prior study and could represent either or combination of residual/recurrent tumor and postoperative change. Continued attention on follow-up imaging is suggested. Vascular/Lymphatic: Aortic atherosclerosis. No enlarged abdominal or pelvic lymph nodes. Reproductive: Prostate is unremarkable. Other: No free air or free fluid in the abdomen. Musculoskeletal: Degenerative changes in the spine and hips. No destructive bone lesions. IMPRESSION: 1. No significant change since prior study. 2. Again demonstrated are metastatic lesions throughout the liver and in the left lingula. 3. Postoperative changes with partial colectomy. Left lower quadrant ileostomy with large peristomal hernia, similar to prior study. No evidence of proximal obstruction. Gas and soft tissue in the presacral space may represent post treatment changes or residual/recurrent tumor. Follow-up as indicated. 4. Bilateral hydronephrosis and hydroureter with distended bladder, likely representing reflux disease. Bladder distention could be physiologic or due to outlet obstruction or decreased motility. Electronically Signed   By: Lucienne Capers M.D.   On: 07/22/2022 00:11   CT Head Wo Contrast  Result Date: 07/21/2022 CLINICAL DATA:  Altered mental status EXAM: CT HEAD WITHOUT CONTRAST TECHNIQUE: Contiguous axial images were obtained from the base of the  skull through the vertex without intravenous contrast.  RADIATION DOSE REDUCTION: This exam was performed according to the departmental dose-optimization program which includes automated exposure control, adjustment of the mA and/or kV according to patient size and/or use of iterative reconstruction technique. COMPARISON:  None Available. FINDINGS: Brain: Hyperdense left frontal lobe mass measuring by 3 x 2.8 x 2.5 cm. Considerable surrounding edema and local mass effect. 6 mm midline shift to the right. Compression of the right frontal horn of left lateral ventricle. Diffuse sulcal swelling on the left consistent with edema Vascular: No hyperdense vessels.  Carotid vascular calcification Skull: Normal. Negative for fracture or focal lesion. Sinuses/Orbits: No acute finding. Other: None IMPRESSION: 3 cm hyperdense left frontal lobe mass with prominent surrounding edema and local mass effect. 6 mm midline shift to the right. Findings favored to represent metastatic disease over primary brain neoplasm given history of metastatic rectal cancer. Critical Value/emergent results were called by telephone at the time of interpretation on 07/21/2022 at 11:31 pm to provider PA Deno Etienne , who verbally acknowledged these results. Electronically Signed   By: Donavan Foil M.D.   On: 07/21/2022 23:35     ASSESSMENT & PLAN:   Greyden Cumbo is a 52 y.o. male with   1.  Oligometastatic rectal cancer with 2 visible liver metastases solitary lesion in the right upper lobe of the lung.-newly noted on CT chest abdomen pelvis. KRAS mutated MMR IHC normal  2.  Initially diagnosed locally Advanced Rectal Adenocarcinoma, c(T3c, N2), mismatch repair protein normal -presented to ED on 11/29/20 with septic shock secondary to Fournier's gangrene -CT AP showed findings suspicious for necrotizing perineal soft tissue infection with irregular presacral nodule. Rectal biopsies obtained on 11/30/20 confirmed rectal  adenocarcinoma. -Baseline CEA on 12/09/20 was elevated at 16.7. -He underwent diagnostic laparoscopy and open descending loop colostomy on 12/13/20 with Dr. Harlow Asa. -Chest CT on 12/14/20 was negative for metastatic disease. -Repeat CEA on 01/06/21 showed further elevation to 52.55. -staging pelvis MRI on 01/12/21 showed: rectal adenocarcinoma T stage: At least T3c, possible involvement of prostatic apex; N stage: N2; long segment rectal tumor, involving anal sphincters. -s/p neoadjuvant FOLFOX chemotherapy S/p resection of liver met and thermal ablation of 2nd liver met S/p Chemo radiation of rectal mass S/p SBRT of lung lesions Recent progression of 3 liver lesions which have been evaluated by Dr. Kathlene Cote with the plan to consider Y90 treatments. Pulmonary lesions on recent scans were noted to be stable. He does have some postoperative fluid collection/inflammatory nodule and skin breakdown at his surgical site.  3.  Newly diagnosed left frontal lobe brain metastasis-this admission  4.  Acute kidney injury likely combination of primarily obstructive uropathy plus some element of dehydration.  Creatinine is rapidly improving after Foley's catheter placement and hydration and is down from 13 down to 2.46.  4. H/o Recurrent PERIANAL ABSCESS.  Patient hospitalized in October for pelvic abscess status post abdominal perineal resection.  PLAN: -I had a detailed discussion with the patient and [name] at bedside.  His daughter is soon completing nursing school in 2 to 3 months and has a good understanding of his medical condition and helps with communication with him as well. -Patient and her daughter understand that new changes.  We discussed that his renal function is rapidly improving with placement of Foley's catheter and hydration.  At this time I do not anticipate that he will need dialysis support. -Appreciate nephrology help with this. -We also discussed in the context of his altered  mental status his CT  head shows concern for left frontal centimeter mass with significant edema and 6 mm midline shift.  This is concerning for metastases from his rectal carcinoma. -Patient has been started on IV high-dose steroids to reduce peritumoral edema. -We have consulted radiation oncology who are following the patient and were intending to get an MRI of the brain when his renal function improves to better determine the status of any other brain metastases and after plan his treatment. -We discussed that the next priority is to treat his brain metastasis/es and subsequent to this assuming he is doing well there would be previously planned consideration of Y90 treatment to treat his disease progression in the liver with Dr. Kathlene Cote as outpatient. -We went over goals of care discussion in detail and patient understands that these treatments are palliative and not curative hoping to extend his life and reduce symptoms from his cancer progression. -Patient and his daughter have a good understanding of this.   The total time spent in the appointment was 60 minutes*.  All of the patient's questions were answered with apparent satisfaction. The patient knows to call the clinic with any problems, questions or concerns.   Sullivan Lone MD MS AAHIVMS [facility] Hematology/Oncology Physician [facility]  .*Total Encounter Time as defined by the Centers for Medicare and Medicaid Services includes, in addition to the face-to-face time of a patient visit (documented in the note above) non-face-to-face time: obtaining and reviewing outside history, ordering and reviewing medications, tests or procedures, care coordination (communications with other health care professionals or caregivers) and documentation in the medical record.

## 2022-07-22 NOTE — Progress Notes (Signed)
History of metastatic rectal cancer s/p APR with ileostomy/peristomal hernia, s/p resection of liver mets and thermal ablation of segment liver mets , s/p  SBRT of lung mets presents with altered mental status, found to have new brain mets, AKI bilateral hydronephrosis.  Foley placed, is started on Decadron.   Currently vital signs are stable, good urine output after foley placement, he is calm and pleasant, he is not oriented to time but to place and person.  Consultant: Nephrology Oncology (Dr Irene Limbo, Dr Mickeal Skinner) [place] [facility]

## 2022-07-22 NOTE — Progress Notes (Signed)
   Patient Details Name: James Coffey MRN: [id number] DOB: 1970-10-26   Cancelled treatment:           Macario Golds 07/22/2022, 6:02 PM  Pt has remained in ER all day, RN reports pt taking in some po intake, note plan for follow up with palliative tomorrow at 1500.  SLP will continue efforts to see pt for BSE as ordered by Dr Irene Pap.     Kathleen Lime, MS [place] [facility] [phone]

## 2022-07-23 ENCOUNTER — Other Ambulatory Visit: Payer: Self-pay

## 2022-07-23 ENCOUNTER — Encounter (HOSPITAL_COMMUNITY): Payer: Self-pay | Admitting: Internal Medicine

## 2022-07-23 ENCOUNTER — Inpatient Hospital Stay (HOSPITAL_COMMUNITY): Payer: Medicaid Other

## 2022-07-23 DIAGNOSIS — R4182 Altered mental status, unspecified: Secondary | ICD-10-CM | POA: Diagnosis not present

## 2022-07-23 DIAGNOSIS — N179 Acute kidney failure, unspecified: Secondary | ICD-10-CM

## 2022-07-23 DIAGNOSIS — G9389 Other specified disorders of brain: Secondary | ICD-10-CM | POA: Diagnosis not present

## 2022-07-23 DIAGNOSIS — C20 Malignant neoplasm of rectum: Secondary | ICD-10-CM | POA: Diagnosis not present

## 2022-07-23 DIAGNOSIS — Z7189 Other specified counseling: Secondary | ICD-10-CM | POA: Diagnosis not present

## 2022-07-23 LAB — CBC
HCT: 28.7 % — ABNORMAL LOW (ref 39.0–52.0)
Hemoglobin: 8.5 g/dL — ABNORMAL LOW (ref 13.0–17.0)
MCH: 26.2 pg (ref 26.0–34.0)
MCHC: 29.6 g/dL — ABNORMAL LOW (ref 30.0–36.0)
MCV: 88.6 fL (ref 80.0–100.0)
Platelets: 379 10*3/uL (ref 150–400)
RBC: 3.24 MIL/uL — ABNORMAL LOW (ref 4.22–5.81)
RDW: 17.2 % — ABNORMAL HIGH (ref 11.5–15.5)
WBC: 9.3 10*3/uL (ref 4.0–10.5)
nRBC: 0 % (ref 0.0–0.2)

## 2022-07-23 LAB — MAGNESIUM: Magnesium: 2.5 mg/dL — ABNORMAL HIGH (ref 1.7–2.4)

## 2022-07-23 LAB — RENAL FUNCTION PANEL
Albumin: 2.5 g/dL — ABNORMAL LOW (ref 3.5–5.0)
Anion gap: 9 (ref 5–15)
BUN: 18 mg/dL (ref 6–20)
CO2: 21 mmol/L — ABNORMAL LOW (ref 22–32)
Calcium: 9.4 mg/dL (ref 8.9–10.3)
Chloride: 110 mmol/L (ref 98–111)
Creatinine, Ser: 0.98 mg/dL (ref 0.61–1.24)
GFR, Estimated: >60 mL/min (ref >60)
Glucose, Bld: 172 mg/dL — ABNORMAL HIGH (ref 70–99)
Phosphorus: 3.1 mg/dL (ref 2.5–4.6)
Potassium: 4.7 mmol/L (ref 3.5–5.1)
Sodium: 140 mmol/L (ref 135–145)

## 2022-07-23 MED ORDER — SODIUM CHLORIDE 0.9 % IV SOLN: INTRAVENOUS | Status: DC

## 2022-07-23 MED ORDER — CHLORHEXIDINE GLUCONATE CLOTH 2 % EX PADS
6.0000 | MEDICATED_PAD | Freq: Every day | CUTANEOUS | Status: DC
  Administered 2022-07-24: 6 via TOPICAL

## 2022-07-23 MED ORDER — GADOBUTROL 1 MMOL/ML IV SOLN
7.5000 mL | Freq: Once | INTRAVENOUS | Status: AC | PRN
  Administered 2022-07-23: 7.5 mL via INTRAVENOUS

## 2022-07-23 MED ORDER — ORAL CARE MOUTH RINSE: 15.0000 mL | OROMUCOSAL | Status: DC | PRN

## 2022-07-23 MED ORDER — LACTATED RINGERS IV SOLN: INTRAVENOUS | Status: DC

## 2022-07-23 NOTE — ED Notes (Signed)
Temp cannot be updated because pt is at [facility]

## 2022-07-23 NOTE — Progress Notes (Signed)
Patient ID: James Coffey, male   DOB: [birth year], 52 y.o.   MRN: [id number] [facility] Progress Note   Assessment/ Plan:   1.  Acute kidney injury: Etiology likely from obstruction/postrenal cause and volume contraction from limited oral intake.  This appears to have resolved based on labs from this morning with recovery of renal function back to his baseline as well as robust urine output overnight.  Continue maintenance IV fluids until has reliable oral intake. 2.  Anion gap metabolic acidosis: Secondary to acute kidney injury, improved and will switch fluids to LR. 3.  Hyponatremia: Secondary to acute kidney injury and impaired free water excretion.  Corrected with renal recovery 4.  Metastatic rectal cancer: Management per oncology. 5.  Altered mental status: Likely from a combination of his acute kidney injury/azotemia and more prominently newly discovered brain metastasis.  Started on Decadron to help reduce brain edema and appears to be improving.   Nephrology will sign off at this time and remain available for questions or concerns.  Subjective:   Without acute events overnight, seen by oncology, palliative care service and radiation oncology services.   Objective:   BP 104/60 (BP Location: Left Arm)   Pulse 61   Temp 97.6 F (36.4 C) (Oral)   Resp 12   SpO2 97%   Intake/Output Summary (Last 24 hours) at 07/23/2022 1102 Last data filed at 07/23/2022 0417 Gross per 24 hour  Intake --  Output 2000 ml  Net -2000 ml   Weight change:   Physical Exam: Gen: Comfortably sleeping in bed, awakens to calling his name with limited conversation. CVS: Pulse regular rhythm, normal rate, S1 and S2 normal Resp: Clear to auscultation bilaterally, no rales/rhonchi Abd: Soft, flat, nontender Ext: No lower extremity edema  Imaging: CT ABDOMEN PELVIS WO CONTRAST  Result Date: 07/22/2022 CLINICAL DATA:  Acute nonlocalized abdominal pain. Left-sided abdominal distention. EXAM:  CT ABDOMEN AND PELVIS WITHOUT CONTRAST TECHNIQUE: Multidetector CT imaging of the abdomen and pelvis was performed following the standard protocol without IV contrast. RADIATION DOSE REDUCTION: This exam was performed according to the departmental dose-optimization program which includes automated exposure control, adjustment of the mA and/or kV according to patient size and/or use of iterative reconstruction technique. COMPARISON:  07/13/2022 FINDINGS: Lower chest: Motion artifact limits evaluation. Lobulated nodule in the left lingula measuring 1.9 cm diameter, unchanged. This is likely metastatic. Hepatobiliary: Liver lesions are again demonstrated although poorly defined on noncontrast imaging. Largest mass is in the dome of the liver, measuring about 7.8 cm diameter. This corresponds to known liver metastasis. There is limited ability to compare the lesion to the previous contrast-enhanced scan. Partial left hepatic resection. Gallbladder and bile ducts are unremarkable. Pancreas: Unremarkable. No pancreatic ductal dilatation or surrounding inflammatory changes. Spleen: Normal in size without focal abnormality. Adrenals/Urinary Tract: No adrenal gland nodules. There is mild bilateral hydronephrosis. No renal or ureteral stones are demonstrated. The bladder is distended without wall thickening. Changes likely to represent urine stasis or reflux. Stomach/Bowel: Stomach, small bowel, and colon are not abnormally distended. There is a partial colonic resection with left lower quadrant descending colostomy. There is a large peristomal hernia containing small and large bowel. This appears similar to the prior study. Soft tissue infiltration and fluid in the presacral space with infiltration or scarring into the surrounding perirectal fat. This appearance is similar to the prior study and could represent either or combination of residual/recurrent tumor and postoperative change. Continued attention on follow-up  imaging is suggested.  Vascular/Lymphatic: Aortic atherosclerosis. No enlarged abdominal or pelvic lymph nodes. Reproductive: Prostate is unremarkable. Other: No free air or free fluid in the abdomen. Musculoskeletal: Degenerative changes in the spine and hips. No destructive bone lesions. IMPRESSION: 1. No significant change since prior study. 2. Again demonstrated are metastatic lesions throughout the liver and in the left lingula. 3. Postoperative changes with partial colectomy. Left lower quadrant ileostomy with large peristomal hernia, similar to prior study. No evidence of proximal obstruction. Gas and soft tissue in the presacral space may represent post treatment changes or residual/recurrent tumor. Follow-up as indicated. 4. Bilateral hydronephrosis and hydroureter with distended bladder, likely representing reflux disease. Bladder distention could be physiologic or due to outlet obstruction or decreased motility. Electronically Signed   By: Lucienne Capers M.D.   On: 07/22/2022 00:11   CT Head Wo Contrast  Result Date: 07/21/2022 CLINICAL DATA:  Altered mental status EXAM: CT HEAD WITHOUT CONTRAST TECHNIQUE: Contiguous axial images were obtained from the base of the skull through the vertex without intravenous contrast. RADIATION DOSE REDUCTION: This exam was performed according to the departmental dose-optimization program which includes automated exposure control, adjustment of the mA and/or kV according to patient size and/or use of iterative reconstruction technique. COMPARISON:  None Available. FINDINGS: Brain: Hyperdense left frontal lobe mass measuring by 3 x 2.8 x 2.5 cm. Considerable surrounding edema and local mass effect. 6 mm midline shift to the right. Compression of the right frontal horn of left lateral ventricle. Diffuse sulcal swelling on the left consistent with edema Vascular: No hyperdense vessels.  Carotid vascular calcification Skull: Normal. Negative for fracture or focal lesion.  Sinuses/Orbits: No acute finding. Other: None IMPRESSION: 3 cm hyperdense left frontal lobe mass with prominent surrounding edema and local mass effect. 6 mm midline shift to the right. Findings favored to represent metastatic disease over primary brain neoplasm given history of metastatic rectal cancer. Critical Value/emergent results were called by telephone at the time of interpretation on 07/21/2022 at 11:31 pm to provider PA Deno Etienne , who verbally acknowledged these results. Electronically Signed   By: Donavan Foil M.D.   On: 07/21/2022 23:35    Labs: BMET Recent Labs  Lab 07/20/22 0910 07/21/22 2226 07/22/22 0408 07/22/22 1444 07/23/22 0859  NA 138 133*  --  140 140  K 5.0 5.1  --  4.9 4.7  CL 105 103  --  108 110  CO2 21* 18*  --  22 21*  GLUCOSE 160* 147*  --  215* 172*  BUN 50* 68*  --  30* 18  CREATININE 11.07* 13.42* 7.49* 2.46* 0.98  CALCIUM 9.4 8.6*  --  9.1 9.4  PHOS  --   --   --   --  3.1   CBC Recent Labs  Lab 07/20/22 0910 07/21/22 2226 07/22/22 0408 07/23/22 0500  WBC 9.9 11.1* 9.4 9.3  NEUTROABS 8.1* 9.4*  --   --   HGB 8.8* 8.3* 8.8* 8.5*  HCT 28.0* 26.9* 28.6* 28.7*  MCV 85.6 87.6 88.0 88.6  PLT 313 308 317 379    Medications:     dexamethasone (DECADRON) injection  6 mg Intravenous Q6H   heparin  5,000 Units Subcutaneous Q8H   LORazepam  0.5 mg Oral QHS   pantoprazole (PROTONIX) IV  40 mg Intravenous Q24H   Elmarie Shiley, MD 07/23/2022, 11:02 AM

## 2022-07-23 NOTE — ED Notes (Signed)
Assumed care of patient. Patient currently sleeping in bed with no signs of acute distress noted. Waiting on ready bed upstairs.

## 2022-07-23 NOTE — Progress Notes (Signed)
Palliative-   Meeting planned with patient, his spouse and daughter today at 3pm.  However, patient was transferred to [facility] for MRI and unavailable. I introduced Palliative medicine to daughter and spouse. Palliative medicine is specialized medical care for people living with serious illness. It focuses on providing relief from the symptoms and stress of a serious illness. The goal is to improve quality of life for both the patient and the family.  Garlan Fillers understand and agree with current plan of care, preparation for [facility] brain therapy. No questions or concerns currently.  We rescheduled Palliative consult to Monday 2/18 at 1030am.  If patient is discharged before then- I recommend referral to outpatient Palliative clinic provider Lavone Nian, NP at [facility].   Mariana Kaufman, AGNP-C Palliative Medicine  No charge

## 2022-07-23 NOTE — ED Notes (Signed)
[facility] here to take patient over to [facility] for MRI.

## 2022-07-23 NOTE — ED Notes (Signed)
ED TO INPATIENT HANDOFF REPORT  Name/Age/Gender James Coffey 52 y.o. male  Code Status    Code Status Orders  (From admission, onward)           Start     Ordered   07/22/22 0253  Full code  Continuous       Question:  By:  Answer:  Consent: discussion documented in EHR   07/22/22 0252           Code Status History     Date Active Date Inactive Code Status Order ID Comments User Context   03/16/2022 1200 03/22/2022 1823 Full Code [id number]  Reubin Milan, MD ED   02/04/2022 1555 02/08/2022 1548 Full Code [id number]  [name], MD Inpatient   08/28/2021 1526 08/31/2021 1614 Full Code [id number]  Dwan Bolt, MD Inpatient   11/30/2020 1154 12/19/2020 2009 Full Code [id number]  Michael Boston, MD Inpatient   11/03/2020 2140 11/05/2020 1932 Full Code [id number]  Toy Baker, MD Inpatient       Home/SNF/Other Home  Chief Complaint AMS (altered mental status) [R41.82] AKI (acute kidney injury) ([place]) [N17.9]  Level of Care/Admitting Diagnosis ED Disposition     ED Disposition  Admit   Condition  --   Comment  Hospital Area: [facility] [[id number]]  Level of Care: Telemetry [5]  Admit to tele based on following criteria: Monitor for Ischemic changes  May admit patient to [name] or [name] if equivalent level of care is available:: No  Covid Evaluation: Confirmed COVID Negative  Diagnosis: AKI (acute kidney injury) [facility]) [id number]  Admitting Physician: Florencia Reasons [id number]  Attending Physician: Florencia Reasons [id number]  Certification:: I certify this patient will need inpatient services for at least 2 midnights          Medical History 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 03/17/22  1130  Buttock  128   Colostomy LLQ 12/13/20  1200  LLQ  587   Urethral Catheter Cassandra, RN Coude 16 Fr. 07/22/22  0046  Coude  1   Incision - 5 Ports Abdomen Left;Upper Right;Upper Right;Mid Right;Lower Right;Lateral;Mid 02/04/22  0943  -- 169            Labs/Imaging Results for orders placed or performed during the hospital encounter of 07/21/22 (from the past 48 hour(s))  CBC with Differential     Status: Abnormal   Collection Time: 07/21/22 10:26 PM  Result Value Ref Range   WBC 11.1 (H) 4.0 - 10.5 K/uL   RBC 3.07 (L) 4.22 - 5.81 MIL/uL   Hemoglobin 8.3 (L) 13.0 - 17.0 g/dL   HCT 26.9 (L) 39.0 - 52.0 %   MCV 87.6 80.0 - 100.0 fL   MCH 27.0 26.0 - 34.0 pg   MCHC 30.9 30.0 - 36.0 g/dL   RDW 17.2 (H) 11.5 - 15.5 %   Platelets 308  150 - 400 K/uL   nRBC 0.0 0.0 - 0.2 %   Neutrophils Relative % 84 %   Neutro Abs 9.4 (H) 1.7 - 7.7 K/uL   Lymphocytes Relative 5 %   Lymphs Abs 0.5 (L) 0.7 - 4.0 K/uL   Monocytes Relative 9 %   Monocytes Absolute 1.0 0.1 - 1.0 K/uL   Eosinophils Relative 1 %   Eosinophils Absolute 0.1 0.0 - 0.5 K/uL   Basophils Relative 0 %   Basophils Absolute 0.1 0.0 - 0.1 K/uL   Immature Granulocytes 1 %   Abs Immature Granulocytes 0.09 (H) 0.00 - 0.07 K/uL    Comment: Performed at [facility], [place] [address], [place], [place] [address]  Comprehensive metabolic panel     Status: Abnormal   Collection Time: 07/21/22 10:26 PM  Result Value Ref Range   Sodium 133 (L) 135 - 145 mmol/L   Potassium 5.1 3.5 - 5.1 mmol/L   Chloride 103 98 - 111 mmol/L   CO2 18 (L) 22 - 32 mmol/L   Glucose, Bld 147 (H) 70 - 99 mg/dL    Comment: Glucose reference range applies  only to samples taken after fasting for at least 8 hours.   BUN 68 (H) 6 - 20 mg/dL   Creatinine, Ser 13.42 (H) 0.61 - 1.24 mg/dL   Calcium 8.6 (L) 8.9 - 10.3 mg/dL   Total Protein 7.6 6.5 - 8.1 g/dL   Albumin 2.7 (L) 3.5 - 5.0 g/dL   AST 14 (L) 15 - 41 U/L   ALT 14 0 - 44 U/L   Alkaline Phosphatase 118 38 - 126 U/L   Total Bilirubin 1.4 (H) 0.3 - 1.2 mg/dL   GFR, Estimated 4 (L) >60 mL/min    Comment: (NOTE) Calculated using the CKD-EPI Creatinine Equation (2021)    Anion gap 12 5 - 15    Comment: Performed at [facility], [place] [address] [place], [place] [address]  Lipase, blood     Status: None   Collection Time: 07/21/22 10:26 PM  Result Value Ref Range   Lipase 30 11 - 51 U/L    Comment: Performed at [facility], [place] [address] [place], [place] [address]  Ammonia     Status: None   Collection Time: 07/21/22 11:37 PM  Result Value Ref Range   Ammonia 11 9 - 35 umol/L    Comment: Performed at [facility], [place] [address], [place],  [address]  Resp panel by RT-PCR (RSV, Flu A&B, Covid) Anterior Nasal Swab     Status: None   Collection Time: 07/21/22 11:37 PM   Specimen: Anterior Nasal Swab  Result Value Ref Range   SARS Coronavirus 2 by RT PCR NEGATIVE NEGATIVE    Comment: (NOTE) SARS-CoV-2 target nucleic acids are NOT DETECTED.  The SARS-CoV-2 RNA is generally detectable in upper respiratory specimens during the acute phase of infection. The lowest concentration of SARS-CoV-2 viral copies this assay can detect is 138 copies/mL. A negative result does not preclude SARS-Cov-2 infection and should not be used as the sole basis for treatment or other patient management decisions. A negative result may occur with  improper specimen collection/handling, submission of specimen other than nasopharyngeal swab, presence of viral mutation(s) within the areas targeted by this assay, and inadequate number of  viral copies(<138 copies/mL). A negative result must be combined with clinical observations, patient history, and epidemiological information. The expected result is Negative.  Fact Sheet  for Patients:  EntrepreneurPulse.com.au  Fact Sheet for Healthcare Providers:  IncredibleEmployment.be  This test is no t yet approved or cleared by the [place] FDA and  has been authorized for detection and/or diagnosis of SARS-CoV-2 by FDA under an Emergency Use Authorization (EUA). This EUA will remain  in effect (meaning this test can be used) for the duration of the COVID-19 declaration under Section 564(b)(1) of the Act, 21 U.S.C.section 360bbb-3(b)(1), unless the authorization is terminated  or revoked sooner.       Influenza A by PCR NEGATIVE NEGATIVE   Influenza B by PCR NEGATIVE NEGATIVE    Comment: (NOTE) The Xpert Xpress SARS-CoV-2/FLU/RSV plus assay is intended as an aid in the diagnosis of influenza from Nasopharyngeal swab specimens and should not be used as a sole basis for treatment. Nasal washings and aspirates are unacceptable for Xpert Xpress SARS-CoV-2/FLU/RSV testing.  Fact Sheet for Patients: EntrepreneurPulse.com.au  Fact Sheet for Healthcare Providers: IncredibleEmployment.be  This test is not yet approved or cleared by the [place] FDA and has been authorized for detection and/or diagnosis of SARS-CoV-2 by FDA under an Emergency Use Authorization (EUA). This EUA will remain in effect (meaning this test can be used) for the duration of the COVID-19 declaration under Section 564(b)(1) of the Act, 21 U.S.C. section 360bbb-3(b)(1), unless the authorization is terminated or revoked.     Resp Syncytial Virus by PCR NEGATIVE NEGATIVE    Comment: (NOTE) Fact Sheet for Patients: EntrepreneurPulse.com.au  Fact Sheet for Healthcare  Providers: IncredibleEmployment.be  This test is not yet approved or cleared by the [place] FDA and has been authorized for detection and/or diagnosis of SARS-CoV-2 by FDA under an Emergency Use Authorization (EUA). This EUA will remain in effect (meaning this test can be used) for the duration of the COVID-19 declaration under Section 564(b)(1) of the Act, 21 U.S.C. section 360bbb-3(b)(1), unless the authorization is terminated or revoked.  Performed at [facility], [place] [address], [place], [place] [address]   Lactic acid, plasma     Status: None   Collection Time: 07/21/22 11:37 PM  Result Value Ref Range   Lactic Acid, Venous 1.0 0.5 - 1.9 mmol/L    Comment: Performed at [facility], [place] [address] [place], [place] [address]  Blood gas, venous (at [facility] and [facility])     Status: Abnormal   Collection Time: 07/22/22 12:31 AM  Result Value Ref Range   pH, Ven 7.41 7.25 - 7.43   pCO2, Ven 38 (L) 44 - 60 mmHg   pO2, Ven 35 32 - 45 mmHg   Bicarbonate 24.1 20.0 - 28.0 mmol/L   Acid-base deficit 0.3 0.0 - 2.0 mmol/L   O2 Saturation 57.4 %   Patient temperature 37.0     Comment: Performed at [facility], [place] [address], [place], [place] [address]  Urinalysis, Routine w reflex microscopic -Urine, Clean Catch     Status: None   Collection Time: 07/22/22 12:46 AM  Result Value Ref Range   Color, Urine YELLOW YELLOW   APPearance CLEAR CLEAR   Specific Gravity, Urine 1.010 1.005 - 1.030   pH 6.0 5.0 - 8.0   Glucose, UA NEGATIVE NEGATIVE mg/dL   Hgb urine dipstick NEGATIVE NEGATIVE   Bilirubin Urine NEGATIVE NEGATIVE   Ketones, ur NEGATIVE NEGATIVE mg/dL   Protein, ur NEGATIVE NEGATIVE mg/dL   Nitrite NEGATIVE NEGATIVE   Leukocytes,Ua NEGATIVE NEGATIVE    Comment: Performed at [facility],  [place] [address], [place], [place] [address]  CBC     Status: Abnormal   Collection Time: 07/22/22   4:08 AM  Result Value Ref Range   WBC 9.4 4.0 - 10.5 K/uL   RBC 3.25 (L) 4.22 - 5.81 MIL/uL   Hemoglobin 8.8 (L) 13.0 - 17.0 g/dL   HCT 28.6 (L) 39.0 - 52.0 %   MCV 88.0 80.0 - 100.0 fL   MCH 27.1 26.0 - 34.0 pg   MCHC 30.8 30.0 - 36.0 g/dL   RDW 17.2 (H) 11.5 - 15.5 %   Platelets 317 150 - 400 K/uL   nRBC 0.0 0.0 - 0.2 %    Comment: Performed at [facility], [place] [address], [place], [place] [address]  Creatinine, serum     Status: Abnormal   Collection Time: 07/22/22  4:08 AM  Result Value Ref Range   Creatinine, Ser 7.49 (H) 0.61 - 1.24 mg/dL   GFR, Estimated 8 (L) >60 mL/min    Comment: (NOTE) Calculated using the CKD-EPI Creatinine Equation (2021) Performed at [facility], [place] [address], [place], [place] [id number]   Basic metabolic panel     Status: Abnormal   Collection Time: 07/22/22  2:44 PM  Result Value Ref Range   Sodium 140 135 - 145 mmol/L   Potassium 4.9 3.5 - 5.1 mmol/L   Chloride 108 98 - 111 mmol/L   CO2 22 22 - 32 mmol/L   Glucose, Bld 215 (H) 70 - 99 mg/dL    Comment: Glucose reference range applies only to samples taken after fasting for at least 8 hours.   BUN 30 (H) 6 - 20 mg/dL   Creatinine, Ser 2.46 (H) 0.61 - 1.24 mg/dL   Calcium 9.1 8.9 - 10.3 mg/dL   GFR, Estimated 31 (L) >60 mL/min    Comment: (NOTE) Calculated using the CKD-EPI Creatinine Equation (2021)    Anion gap 10 5 - 15    Comment: Performed at [facility], [place] [address], [place], [place] [address]  CBC     Status: Abnormal   Collection Time: 07/23/22  5:00 AM  Result Value Ref Range   WBC 9.3 4.0 - 10.5 K/uL   RBC 3.24 (L) 4.22 - 5.81 MIL/uL   Hemoglobin 8.5 (L) 13.0 - 17.0 g/dL   HCT 28.7 (L) 39.0 - 52.0 %   MCV 88.6 80.0 - 100.0 fL   MCH 26.2 26.0 - 34.0 pg   MCHC 29.6 (L) 30.0 - 36.0 g/dL   RDW 17.2 (H) 11.5 - 15.5 %   Platelets 379 150 - 400 K/uL   nRBC 0.0 0.0 - 0.2 %    Comment: Performed at [facility], [place] [address] [place], [place] [address]  Magnesium     Status: Abnormal   Collection Time: 07/23/22  8:59 AM  Result Value Ref Range   Magnesium 2.5 (H) 1.7 - 2.4 mg/dL    Comment: Performed at [facility], [place] [address], [place],  [address]  Renal function panel     Status: Abnormal   Collection Time: 07/23/22  8:59 AM  Result Value Ref Range   Sodium 140 135 - 145 mmol/L    Comment: DELTA CHECK NOTED   Potassium 4.7 3.5 - 5.1 mmol/L   Chloride 110 98 - 111 mmol/L   CO2 21 (L) 22 - 32 mmol/L   Glucose, Bld 172 (H) 70 - 99 mg/dL  Comment: Glucose reference range applies only to samples taken after fasting for at least 8 hours.   BUN 18 6 - 20 mg/dL   Creatinine, Ser 0.98 0.61 - 1.24 mg/dL    Comment: DELTA CHECK NOTED   Calcium 9.4 8.9 - 10.3 mg/dL   Phosphorus 3.1 2.5 - 4.6 mg/dL   Albumin 2.5 (L) 3.5 - 5.0 g/dL   GFR, Estimated >60 >60 mL/min    Comment: (NOTE) Calculated using the CKD-EPI Creatinine Equation (2021)    Anion gap 9 5 - 15    Comment: Performed at [facility], [place] [address] [place], [place] [address]   CT ABDOMEN PELVIS WO CONTRAST  Result Date: 07/22/2022 CLINICAL DATA:  Acute nonlocalized abdominal pain. Left-sided abdominal distention. EXAM: CT ABDOMEN AND PELVIS WITHOUT CONTRAST TECHNIQUE: Multidetector CT imaging of the abdomen and pelvis was performed following the standard protocol without IV contrast. RADIATION DOSE REDUCTION: This exam was performed according to the departmental dose-optimization program which includes automated exposure control, adjustment of the mA and/or kV according to patient size and/or use of iterative reconstruction technique. COMPARISON:  07/13/2022 FINDINGS: Lower chest: Motion artifact limits evaluation. Lobulated nodule in the left lingula measuring 1.9 cm diameter, unchanged. This is likely metastatic. Hepatobiliary: Liver lesions are again demonstrated  although poorly defined on noncontrast imaging. Largest mass is in the dome of the liver, measuring about 7.8 cm diameter. This corresponds to known liver metastasis. There is limited ability to compare the lesion to the previous contrast-enhanced scan. Partial left hepatic resection. Gallbladder and bile ducts are unremarkable. Pancreas: Unremarkable. No pancreatic ductal dilatation or surrounding inflammatory changes. Spleen: Normal in size without focal abnormality. Adrenals/Urinary Tract: No adrenal gland nodules. There is mild bilateral hydronephrosis. No renal or ureteral stones are demonstrated. The bladder is distended without wall thickening. Changes likely to represent urine stasis or reflux. Stomach/Bowel: Stomach, small bowel, and colon are not abnormally distended. There is a partial colonic resection with left lower quadrant descending colostomy. There is a large peristomal hernia containing small and large bowel. This appears similar to the prior study. Soft tissue infiltration and fluid in the presacral space with infiltration or scarring into the surrounding perirectal fat. This appearance is similar to the prior study and could represent either or combination of residual/recurrent tumor and postoperative change. Continued attention on follow-up imaging is suggested. Vascular/Lymphatic: Aortic atherosclerosis. No enlarged abdominal or pelvic lymph nodes. Reproductive: Prostate is unremarkable. Other: No free air or free fluid in the abdomen. Musculoskeletal: Degenerative changes in the spine and hips. No destructive bone lesions. IMPRESSION: 1. No significant change since prior study. 2. Again demonstrated are metastatic lesions throughout the liver and in the left lingula. 3. Postoperative changes with partial colectomy. Left lower quadrant ileostomy with large peristomal hernia, similar to prior study. No evidence of proximal obstruction. Gas and soft tissue in the presacral space may represent  post treatment changes or residual/recurrent tumor. Follow-up as indicated. 4. Bilateral hydronephrosis and hydroureter with distended bladder, likely representing reflux disease. Bladder distention could be physiologic or due to outlet obstruction or decreased motility. Electronically Signed   By: Lucienne Capers M.D.   On: 07/22/2022 00:11   CT Head Wo Contrast  Result Date: 07/21/2022 CLINICAL DATA:  Altered mental status EXAM: CT HEAD WITHOUT CONTRAST TECHNIQUE: Contiguous axial images were obtained from the base of the skull through the vertex without intravenous contrast. RADIATION DOSE REDUCTION: This exam was performed according to the departmental dose-optimization program which includes  automated exposure control, adjustment of the mA and/or kV according to patient size and/or use of iterative reconstruction technique. COMPARISON:  None Available. FINDINGS: Brain: Hyperdense left frontal lobe mass measuring by 3 x 2.8 x 2.5 cm. Considerable surrounding edema and local mass effect. 6 mm midline shift to the right. Compression of the right frontal horn of left lateral ventricle. Diffuse sulcal swelling on the left consistent with edema Vascular: No hyperdense vessels.  Carotid vascular calcification Skull: Normal. Negative for fracture or focal lesion. Sinuses/Orbits: No acute finding. Other: None IMPRESSION: 3 cm hyperdense left frontal lobe mass with prominent surrounding edema and local mass effect. 6 mm midline shift to the right. Findings favored to represent metastatic disease over primary brain neoplasm given history of metastatic rectal cancer. Critical Value/emergent results were called by telephone at the time of interpretation on 07/21/2022 at 11:31 pm to provider PA Deno Etienne , who verbally acknowledged these results. Electronically Signed   By: Donavan Foil M.D.   On: 07/21/2022 23:35    Pending Labs Unresulted Labs (From admission, onward)     Start     Ordered   07/23/22 0500   Renal function panel  Daily,   R      07/22/22 1232            Vitals/Pain Today's Vitals   07/23/22 1215 07/23/22 1242 07/23/22 1300 07/23/22 1400  BP:   117/71 112/76  Pulse: (!) 53  (!) 57 (!) 55  Resp: 12  18 16  $ Temp:  98.3 F (36.8 C)    TempSrc:  Oral    SpO2: 99%  98% 97%  PainSc:        Isolation Precautions No active isolations  Medications Medications  heparin injection 5,000 Units (5,000 Units Subcutaneous Given 07/23/22 0513)  dexamethasone (DECADRON) injection 6 mg (6 mg Intravenous Given 07/23/22 1156)  LORazepam (ATIVAN) tablet 0.5 mg (0.5 mg Oral Given 07/22/22 2126)  acetaminophen (TYLENOL) tablet 650 mg (has no administration in time range)  prochlorperazine (COMPAZINE) injection 5 mg (has no administration in time range)  polyethylene glycol (MIRALAX / GLYCOLAX) packet 17 g (has no administration in time range)  pantoprazole (PROTONIX) injection 40 mg (40 mg Intravenous Given 07/23/22 0513)  lactated ringers infusion ( Intravenous New Bag/Given 07/23/22 1201)  dexamethasone (DECADRON) injection 10 mg (10 mg Intravenous Given 07/22/22 0027)  sodium chloride 0.9 % bolus 1,000 mL (0 mLs Intravenous Stopped 07/22/22 0544)    Mobility Unknown at this time.

## 2022-07-23 NOTE — ED Notes (Signed)
Speech coming to evaluate patient.

## 2022-07-23 NOTE — Progress Notes (Signed)
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  Foley placed, is started on Decadron.    Subjective:   Improving, less confused, no agitation, cr normalized, good urine output   Assessment & Plan:  Principal Problem:   AMS (altered mental status) Active Problems:   AKI (acute kidney injury) ([place])   Rectal cancer metastasized to liver [facility])    Assessment and Plan:   Acute metabolic encephalopathy likely from AKI and brain mets -AKI resolved, less confused, continued on Decadron for brain mets  AKI, likely dehydration and postobstructive (bilateral hydronephrosis and hydroureter distended bladder status post Foley insertion on 2/14 ) Creatinine normalized with IV hydration and Foley insertion Nephrology Signed off  Metabolic acidosis Hyponatremia Resolved after hydration and correcting AKI   metastatic rectal cancer s/p APR with ileostomy/peristomal hernia, s/p resection of liver mets and thermal ablation of segment liver mets , s/p  SBRT of lung mets presents with altered mental status, 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 [name] outpatient Local wound care with wound care specialist guidance       .  I have Reviewed nursing notes, Vitals, pain scores, I/o's, Lab results and  imaging results since pt's last encounter, details please see discussion above  I ordered the following labs:  Unresulted Labs (From admission, onward)     Start     Ordered   07/23/22  0610  Magnesium  Once,   R        07/23/22 0610   07/23/22 0610  Renal function panel  Once,   R        07/23/22 0610   07/23/22 0610  Phosphorus  Once,   R        07/23/22 0610   07/23/22 0500  Renal function panel  Daily,   R      07/22/22 1232             DVT prophylaxis: heparin injection 5,000 Units Start: 07/22/22 0600   Code Status:   Code Status: Full Code  Family Communication: Patient Disposition:   Dispo: The patient is from: Home              Anticipated d/c is to: Home              Anticipated d/c date is: TBD, needs radiation oncology clearance  Antimicrobials:   Anti-infectives (From admission, onward)    None           Objective: Vitals:   07/23/22 0116 07/23/22 0400 07/23/22 0458 07/23/22 0743  BP:  110/69  104/60  Pulse:  (!) 58  61  Resp:  14  12  Temp: 98.4 F (36.9 C)  98.6 F (37 C) 97.6 F (36.4 C)  TempSrc:   Oral Oral  SpO2:  96%  97%    Intake/Output Summary (Last 24 hours) at 07/23/2022 0848 Last data filed at 07/23/2022 0417 Gross per 24 hour  Intake --  Output 2000 ml  Net -2000 ml  There were no vitals filed for this visit.  Examination:  General exam: Slightly confused about the month but able to self-correct Respiratory system: Clear to auscultation. Respiratory effort normal. Cardiovascular system:  RRR.  Gastrointestinal system: Abdomen is nondistended, soft and nontender.  Normal bowel sounds heard. Central nervous system: Alert and oriented. No focal neurological deficits. Extremities:  no edema Skin: Wound dressing on to rectum gluteal fold Psychiatry: Calm and cooperative    Data Reviewed: I have personally reviewed  labs and visualized  imaging studies since the last encounter and formulate the plan        Scheduled Meds:  dexamethasone (DECADRON) injection  6 mg Intravenous Q6H   heparin  5,000 Units Subcutaneous Q8H   LORazepam  0.5 mg Oral QHS   pantoprazole (PROTONIX) IV  40 mg Intravenous  Q24H   Continuous Infusions:  sodium chloride       LOS: 1 day   Time spent: 80mns  FFlorencia Reasons MD PhD FACP [facility]  Available via Epic secure chat 7am-7pm for nonurgent issues Please page for urgent issues To page the attending provider between 7A-7P or the covering provider during after hours 7P-7A, please log into the web site [email or web address] and access using universal [place] password for that web site. If you do not have the password, please call the hospital operator.    07/23/2022, 8:48 AM

## 2022-07-23 NOTE — Progress Notes (Signed)
07/23/22 1027  SLP Visit Information  SLP Received On 07/23/22  Subjective  Subjective pt awake in bed  Patient/Family Stated Goal to get food  General Information  Date of Onset 07/23/22  HPI 52 y.o. male with medical history significant for oligometastatic rectal cancer status postchemotherapy, with 2 visible liver metastases, bilateral pulmonary metastases, initially diagnosed with locally advanced rectal adenocarcinoma, iron deficiency anemia, recurrent perianal abscess(hospitalized in October for pelvic abscess status post abdominal perineal resection).  Now admitted with elevated creatinine to 13.  Found to be retaining urine.  CT head showed 3 cm hyperdense left frontal lobe mass with prominent surrounding edema and local mass effect.  6 mm midline shift to the right.    Plan was for brain radiation but on hold due to his renal function.  Type of Study Bedside Swallow Evaluation  Diet Prior to this Study Clear liquid diet  Temperature Spikes Noted No  Respiratory Status Room air  History of Recent Intubation No  Behavior/Cognition Alert;Cooperative;Pleasant mood  Oral Cavity Assessment WFL  Oral Care Completed by SLP No  Oral Cavity - Dentition Dentures, top;Missing dentition (no lower dentition)  Vision Functional for self-feeding  Self-Feeding Abilities Able to feed self  Patient Positioning Upright in bed  Baseline Vocal Quality Normal  Volitional Cough Strong  Volitional Swallow Unable to elicit  Pain Assessment  Pain Assessment No/denies pain  Oral Assessment (Complete on admission/transfer/every shift)  Oral Assessment  (WDL) WDL  Oral Motor/Sensory Function  Overall Oral Motor/Sensory Function WFL  Ice Chips  Ice chips NT  Thin Liquid  Thin Liquid WFL  Nectar Thick Liquid  Nectar Thick Liquid NT  Honey Thick Liquid  Honey Thick Liquid WFL  Puree  Puree NT  Solid  Solid WFL  SLP - End of Session  Patient left in bed  Nurse Communication Aspiration  precautions reviewed;Diet recommendation;Swallow strategies reviewed  SLP Assessment  Clinical Impression Statement (ACUTE ONLY) Pt with normal oropharyngeal swallow ability based on clinical swallow evaluation. He did not pass 3 ounce [place] due to cough within 7 seconds of water consumption - however no furhter coughing epsidoes with several thin trials noted. Pt observed with thin water, cheese stick and 4 graham crackers. Mastication was adequate with full oral clearance and no indication of pharyngeal dysphagia.  Pt has ill fitting upper partial and no lower dentition but reports he eats regular/thin foods at home.    Recommend regular/thin diet to allow pt to choose food items *when he gets to the floor* that he can manage. Of note, attempted to use interpreter Ipad but was unable to get it to connect and pt did not want SLP to locate another as he reports he understands some [place].  No SLP follow up indicated.  SLP Visit Diagnosis Dysphagia, unspecified (R13.10)  Impact on safety and function No limitations  Swallow Evaluation Recommendations  SLP Diet Recommendations Regular;Thin liquid  Liquid Administration via Cup;Straw  Medication Administration Whole meds with liquid  Supervision Patient able to self feed  Compensations Slow rate;Small sips/bites  Postural Changes Seated upright at 90 degrees  Treatment Plan  Treatment Recommendations No treatment recommended at this time  Follow Up Recommendations No SLP follow up  Individuals Consulted  Consulted and Agree with Results and Recommendations MD;Patient;RN  Report Sent to  Referring physician  Progression Toward Goals  Progression toward goals Goals met, education completed, patient discharged from SLP  SLP Time Calculation  SLP Start Time (ACUTE ONLY) 1125  SLP Stop  Time (ACUTE ONLY) 1146  SLP Time Calculation (min) (ACUTE ONLY) 21 min  SLP Evaluations  $ SLP Speech Visit 1 Visit  SLP Evaluations  $BSS Swallow 1 Procedure    Kathleen Lime, MS [facility] SLP Acute Rehab Services Office [phone]

## 2022-07-24 DIAGNOSIS — R4182 Altered mental status, unspecified: Secondary | ICD-10-CM | POA: Diagnosis not present

## 2022-07-24 DIAGNOSIS — C787 Secondary malignant neoplasm of liver and intrahepatic bile duct: Secondary | ICD-10-CM | POA: Diagnosis not present

## 2022-07-24 DIAGNOSIS — G9389 Other specified disorders of brain: Secondary | ICD-10-CM | POA: Diagnosis not present

## 2022-07-24 DIAGNOSIS — C7931 Secondary malignant neoplasm of brain: Secondary | ICD-10-CM | POA: Diagnosis not present

## 2022-07-24 DIAGNOSIS — C20 Malignant neoplasm of rectum: Secondary | ICD-10-CM | POA: Diagnosis not present

## 2022-07-24 DIAGNOSIS — Z7189 Other specified counseling: Secondary | ICD-10-CM | POA: Diagnosis not present

## 2022-07-24 DIAGNOSIS — N179 Acute kidney failure, unspecified: Secondary | ICD-10-CM | POA: Diagnosis not present

## 2022-07-24 LAB — RENAL FUNCTION PANEL
Albumin: 2.8 g/dL — ABNORMAL LOW (ref 3.5–5.0)
Anion gap: 9 (ref 5–15)
BUN: 17 mg/dL (ref 6–20)
CO2: 23 mmol/L (ref 22–32)
Calcium: 9.3 mg/dL (ref 8.9–10.3)
Chloride: 107 mmol/L (ref 98–111)
Creatinine, Ser: 0.94 mg/dL (ref 0.61–1.24)
GFR, Estimated: >60 mL/min (ref >60)
Glucose, Bld: 169 mg/dL — ABNORMAL HIGH (ref 70–99)
Phosphorus: 3.7 mg/dL (ref 2.5–4.6)
Potassium: 4.7 mmol/L (ref 3.5–5.1)
Sodium: 139 mmol/L (ref 135–145)

## 2022-07-24 MED ORDER — SODIUM CHLORIDE 0.9% FLUSH
10.0000 mL | INTRAVENOUS | Status: DC | PRN
  Administered 2022-07-25: 10 mL

## 2022-07-24 MED ORDER — LEVETIRACETAM 500 MG PO TABS
500.0000 mg | ORAL_TABLET | Freq: Two times a day (BID) | ORAL | Status: DC
  Administered 2022-07-24 – 2022-07-25 (×3): 500 mg via ORAL
  Filled 2022-07-24 (×3): qty 1

## 2022-07-24 NOTE — Progress Notes (Signed)
James Coffey   [phone] Fax:([phone]   Inpatient medical oncology consult note   Patient Care Team: Patient, No Pcp Per as PCP - General (General Practice) [name], MD as Consulting Physician (Urology) [name], MD as Consulting Physician (Gastroenterology) [name], MD as Consulting Physician (Colon and Rectal Surgery) [name], MD as Consulting Physician (Oncology)  Date of Service:07/24/22   SUBJECTIVE  James Coffey was seen in medical oncology follow-up today.  He appears to have mentally cleared up.  His daughter at [facility] was at bedside during this discussion.  She agrees that patient is probably more like himself today.  She helped with translation. His renal function now appears back to normal with a creatinine of 0.94.  He is looking forward to trying to get rid of his urinary catheter if he can succeed with urination without significant retention. He had a discussion with radiation oncology regarding possible single dose SRS followed by tumor resection by neurosurgery Dr. [name].  He was asking me if there were any alternative options to surgery and notes that he is not sure he wants to go through surgery.  We discussed that there might be an [place] only option but that he should discuss this with radiation oncology and neurosurgery as per planned visit as outpatient. No seizure activity.  Has been started on Keppra. Notes no other acute new symptoms.  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)    rectal cancer with mets to liver   Tobacco abuse     SURGICAL HISTORY: Past Surgical History:  Procedure Laterality Date   HERNIA REPAIR     INCISION AND DRAINAGE ABSCESS N/A 12/01/2020   Procedure: INCISION AND DRAINAGE ABSCESS;  Surgeon: Michael Boston, MD;  Location: [facility];  Service: General;  Laterality: N/A;   INCISION AND DRAINAGE  ABSCESS N/A 12/04/2020   Procedure: INCISION AND DRAINAGE FOURNIERS GANGRENE;  Surgeon: [name], MD;  Location[facility];  Service: General;  Laterality: N/A;   INCISION AND DRAINAGE PERIRECTAL ABSCESS N/A 11/30/2020   Procedure: INCISION AND DEBRIDEMENT OF PERIRECTAL ABSCESS AND RECTAL WALL BIOPSIES;  Surgeon: Michael Boston, MD;  [facility];  Service: General;  Laterality: N/A;   INGUINAL HERNIA REPAIR Right 2007   IR IMAGING GUIDED PORT INSERTION  05/07/2021   IR RADIOLOGIST EVAL & MGMT  08/27/2021   IR RADIOLOGIST EVAL & MGMT  09/12/2021   IR RADIOLOGIST EVAL & MGMT  11/10/2021   IR RADIOLOGIST EVAL & MGMT  01/05/2022   IR SINUS/FIST TUBE CHK-NON GI  04/28/2022   LAPAROSCOPIC LOOP COLOSTOMY N/A 12/13/2020   Procedure: DIAGNOSTIC LAPAROSCOPY WITH  OPEN DESCENDING LOOP COLOSTOMY;  Surgeon: Armandina Gemma, MD;  [facility];  Service: General;  Laterality: N/A;   LAPAROSCOPY N/A 08/28/2021   Procedure: LAPAROSCOPY DIAGNOSTIC;  Surgeon: Dwan Bolt, MD;  Location: [place];  Service: General;  Laterality: N/A;   OPEN PARTIAL HEPATECTOMY  Left 08/28/2021   Procedure: OPEN LEFT LATERAL HEPATECTOMY WITH INTRAOPERATIVE ULTRASOUND;  Surgeon: Dwan Bolt, MD;  Location: [place];  Service: General;  Laterality: Left;   RADIOLOGY WITH ANESTHESIA N/A 10/08/2021   Procedure: MICROWAVE ABLATION;  Surgeon: Aletta Edouard, MD;  [facility];  Service: Radiology;  Laterality: N/A;   RECTAL SURGERY     XI ROBOTIC ASSISTED PARASTOMAL HERNIA REPAIR N/A 02/04/2022   Procedure: ROBOTIC ABDOMINAL PERINEAL RESECTION, PARASTOMAL HERNIA REPAIR,  BILATERAL TAP BLOCK;  Surgeon: [name], MD;  [facility];  Service: General;  Laterality: N/A;    ALLERGIES:  is allergic to hydrocodone and pork-derived products.  ROS  10 Point review of Systems was done is negative except as noted above.   MEDICATIONS:  Current Facility-Administered Medications  Medication Dose Route Frequency Provider Last  Rate Last Admin   acetaminophen (TYLENOL) tablet 650 mg  650 mg Oral Q6H PRN Kayleen Memos, DO       Chlorhexidine Gluconate Cloth 2 % PADS 6 each  6 each Topical Daily Florencia Reasons, MD   6 each at 07/24/22 0950   dexamethasone (DECADRON) injection 6 mg  6 mg Intravenous Q6H Irene Pap N, DO   6 mg at 07/24/22 1710   heparin injection 5,000 Units  5,000 Units Subcutaneous Q8H Irene Pap N, DO   5,000 Units at 07/24/22 1318   lactated ringers infusion   Intravenous Continuous Elmarie Shiley, MD 50 mL/hr at 07/24/22 1203 New Bag at 07/24/22 1203   levETIRAcetam (KEPPRA) tablet 500 mg  500 mg Oral BID Hayden Pedro, PA-C   500 mg at 07/24/22 1318   LORazepam (ATIVAN) tablet 0.5 mg  0.5 mg Oral QHS Irene Pap N, DO   0.5 mg at 07/23/22 2218   Oral care mouth rinse  15 mL Mouth Rinse PRN Florencia Reasons, MD       pantoprazole (PROTONIX) injection 40 mg  40 mg Intravenous Q24H Irene Pap N, DO   40 mg at 07/24/22 [id number]   polyethylene glycol (MIRALAX / GLYCOLAX) packet 17 g  17 g Oral Daily PRN Irene Pap N, DO       prochlorperazine (COMPAZINE) injection 5 mg  5 mg Intravenous Q6H PRN Irene Pap N, DO       sodium chloride flush (NS) 0.9 % injection 10-40 mL  10-40 mL Intracatheter PRN Florencia Reasons, MD       Facility-Administered Medications Ordered in Other Encounters  Medication Dose Route Frequency Provider Last Rate Last Admin   palonosetron (ALOXI) 0.25 MG/5ML injection            sodium chloride flush (NS) 0.9 % injection 10 mL  10 mL Intracatheter Once [name], MD        PHYSICAL EXAMINATION: ECOG PERFORMANCE STATUS: 1 - Symptomatic but completely ambulatory  Vital signs reviewed Wt Readings from Last 3 Encounters:  07/20/22 165 lb 9.6 oz (75.1 kg)  07/03/22 169 lb 9.6 oz (76.9 kg)  06/24/22 175 lb (79.4 kg)  . [name] GENERAL:alert, in no acute distress and comfortable NECK: supple, no JVD LYMPH:  no palpable lymphadenopathy in James cervical, axillary or inguinal  regions LUNGS: clear to auscultation b/l with normal respiratory effort HEART: regular rate & rhythm ABDOMEN:  normoactive bowel sounds , non tender, not distended. Extremity: Trace bilateral pedal edema PSYCH: alert & oriented x 3 with fluent speech NEURO: no focal motor/sensory deficits    LABORATORY DATA:  .    Latest Ref Rng & Units 07/23/2022    5:00 AM 07/22/2022    4:08 AM 07/21/2022   10:26 PM  CBC  WBC 4.0 - 10.5 K/uL 9.3  9.4  11.1   Hemoglobin 13.0 - 17.0 g/dL 8.5  8.8  8.3   Hematocrit 39.0 - 52.0 % 28.7  28.6  26.9   Platelets 150 - 400 K/uL 379  317  308    .    Latest Ref Rng & Units  07/24/2022    6:01 AM 07/23/2022    8:59 AM 07/22/2022    2:44 PM  CMP  Glucose 70 - 99 mg/dL 169  172  215   BUN 6 - 20 mg/dL 17  18  30   $ Creatinine 0.61 - 1.24 mg/dL 0.94  0.98  2.46   Sodium 135 - 145 mmol/L 139  140  140   Potassium 3.5 - 5.1 mmol/L 4.7  4.7  4.9   Chloride 98 - 111 mmol/L 107  110  108   CO2 22 - 32 mmol/L 23  21  22   $ Calcium 8.9 - 10.3 mg/dL 9.3  9.4  9.1    Lab Results  Component Value Date   FERRITIN 66 06/02/2022    SURGICAL PATHOLOGY   THIS IS AN ADDENDUM REPORT   CASE: [id number]  PATIENT: James Coffey  Surgical Pathology Report  Addendum    Reason for Addendum #1:  DNA Mismatch Repair IHC Results   Clinical History: Fournier's Gangrene; Cancer ? ([facility])      FINAL MICROSCOPIC DIAGNOSIS:   A. IRRIGATION AND DEBRIDEMENT OF SCROTUM AND PENIS:  Acute necrotizing inflammation extensively involving fascia    B. Rectal wall biopsies:  Adenocarcinoma of James rectum        RADIOGRAPHIC STUDIES: I have personally reviewed James radiological images as listed and agreed with James findings in James report. MR BRAIN W WO CONTRAST  Result Date: 07/23/2022 CLINICAL DATA:  Brain/CNS neoplasm, staging SRS with BRAIN LAB scan at [facility] EXAM: MRI HEAD WITHOUT AND WITH CONTRAST TECHNIQUE: Multiplanar, multiecho pulse sequences of James brain and  surrounding structures were obtained without and with intravenous contrast. CONTRAST:  7.60m GADAVIST GADOBUTROL 1 MMOL/ML IV SOLN COMPARISON:  CT head from July 21, 2022. FINDINGS: Brain: 3.0 x 3.4 by 2.9 cm peripherally enhancing mass in James left frontal lobe superiorly. Surrounding edema and mass effect with approximately 7 mm of rightward midline shift at this site. Mild effacement James left lateral ventricle. No evidence of hydrocephalus, acute hemorrhage, acute infarct or extra-axial fluid collection. Vascular: Major arterial flow voids are maintained at James skull base. Skull and upper cervical spine: Normal marrow signal. Sinuses/Orbits: Clear sinuses.  No acute orbital findings. Other: No mastoid effusions. IMPRESSION: Findings compatible with a single 3.4 cm metastasis in James left frontal lobe, described above. Electronically Signed   By: FMargaretha SheffieldM.D.   On: 07/23/2022 17:17     ASSESSMENT & PLAN:   James Logais a 52y.o. male with   1.  Oligometastatic rectal cancer with 2 visible liver metastases solitary lesion in James right upper lobe of James lung.-newly noted on CT chest abdomen pelvis. KRAS mutated MMR IHC normal  2.  Initially diagnosed locally Advanced Rectal Adenocarcinoma, c(T3c, N2), mismatch repair protein normal -presented to ED on 11/29/20 with septic shock secondary to Fournier's gangrene -CT AP showed findings suspicious for necrotizing perineal soft tissue infection with irregular presacral nodule. Rectal biopsies obtained on 11/30/20 confirmed rectal adenocarcinoma. -Baseline CEA on 12/09/20 was elevated at 16.7. -He underwent diagnostic laparoscopy and open descending loop colostomy on 12/13/20 with Dr. GHarlow Asa -Chest CT on 12/14/20 was negative for metastatic disease. -Repeat CEA on 01/06/21 showed further elevation to 52.55. -staging pelvis MRI on 01/12/21 showed: rectal adenocarcinoma T stage: At least T3c, possible involvement of prostatic apex; N stage: N2; long  segment rectal tumor, involving anal sphincters. -s/p neoadjuvant FOLFOX chemotherapy S/p resection of liver met and thermal ablation of 2nd  liver met S/p Chemo radiation of rectal mass S/p SBRT of lung lesions Recent progression of 3 liver lesions which have been evaluated by Dr. Kathlene Cote with James plan to consider Y90 treatments. Pulmonary lesions on recent scans were noted to be stable. He does have some postoperative fluid collection/inflammatory nodule and skin breakdown at his surgical site.  3.  Newly diagnosed left frontal lobe brain metastasis-this admission  4.  Acute kidney injury likely combination of primarily obstructive uropathy plus some element of dehydration.  Creatinine is rapidly improving after Foley's catheter placement and hydration and is down from 13 down to 2.46 has normalized with a creatinine of 0.94  4. H/o Recurrent PERIANAL ABSCESS.  Patient hospitalized in October for pelvic abscess status post abdominal perineal resection.  PLAN: -I had a detailed discussion with James patient and [name] at bedside regarding his labs and treatment course thus far. -We discussed that his renal function has resolved back to his baseline and that James hospital medicine doctor will help me make a decision on whether he needs to go home with a urinary catheter or if he is able to pee without that. -We discussed his MRI brain report shows single 3.4 cm metastasis in James left frontal lobe. -He has been evaluated by radiation oncology and they are considering single dose SRS with consideration of possible resection of isolated metastasis. -He is supposed to meet radiation oncology and Dr. [name] from neurosurgery as outpatient for final treatment plan. -Patient and his daughter wonder if there is an option for SRS only without surgery and I encouraged them to discuss this with James radiation oncologist upon follow-up. -Once he is stable from a brain metastasis standpoint he  will need to follow-up with Dr. Kathlene Cote as per plan to address his liver metastases. -Steroids per radiation oncology -Antiseizure medications per radiation oncology/neurosurgery. -James patient's and his daughter's questions were answered in detail. -We will set him up for outpatient follow-up  James total time spent in James appointment was 25 minutes*.  All of James patient's questions were answered with apparent satisfaction. James patient knows to call James clinic with any problems, questions or concerns.   James Lone MD MS AAHIVMS [facility] Coffey [facility] Hematology/Oncology Physician [facility]  .*Total Encounter Time as defined by James Centers for Medicare and Medicaid Services includes, in addition to James face-to-face time of a patient visit (documented in James note above) non-face-to-face time: obtaining and reviewing outside history, ordering and reviewing medications, tests or procedures, care coordination (communications with other health care professionals or caregivers) and documentation in James medical record.

## 2022-07-24 NOTE — Progress Notes (Signed)
I spoke with the patient's daughter and with Dr. [name] after 3T MRI showed solitary disease in the brain. The plan will be to start anti seizure prophylaxis with Keppra 500 mg BID as per Dr. [name]. Hopefully he can discharge this weekend, and follow up with [place] next week. He will be ocntacted by our special procedures navigator to coordinate simulation early next week followed by treatment in a single fraction with SRS, then craniotomy within a day or two after SRS. He should remain on Dexamethasone 4 mg TID and Keppra as above until surgery.      Carola Rhine, PAC

## 2022-07-24 NOTE — TOC Initial Note (Signed)
Transition of Care [name]) - Initial/Assessment Note    Patient Details  Name: James Coffey MRN: [id number] Date of Birth: [birth year]  [facility]) CM/SW Contact:    Vassie Moselle, LCSW Phone Number: 07/24/2022, 9:27 AM  Clinical Narrative:                 Plan for pt to return home at discharge. TOC following for further recommendations and discharge needs.    Expected Discharge Plan: Home/Self Care Barriers to Discharge: Continued Medical Work up, Other (must enter comment) ([place] conversation)   Patient Goals and CMS Choice   CMS Medicare.gov Compare Post Acute Care list provided to:: Patient Choice offered to / list presented to : Patient      Expected Discharge Plan and Services In-house Referral: Hospice / [place], Interpreting Services Discharge Planning Services: NA   Living arrangements for the past 2 months: Apartment                                      Prior Living Arrangements/Services Living arrangements for the past 2 months: Apartment Lives with:: Spouse Patient language and need for interpreter reviewed:: Yes Do you feel safe going back to the place where you live?: Yes      Need for Family Participation in Patient Care: Yes (Comment) Care giver support system in place?: No (comment) Current home services: DME Criminal Activity/Legal Involvement Pertinent to Current Situation/Hospitalization: No - Comment as needed  Activities of Daily Living Home Assistive Devices/Equipment: Walker (specify type) ADL Screening (condition at time of admission) Patient's cognitive ability adequate to safely complete daily activities?: No Is the patient deaf or have difficulty hearing?: No Does the patient have difficulty seeing, even when wearing glasses/contacts?: No Does the patient have difficulty concentrating, remembering, or making decisions?: Yes Patient able to express need for assistance with ADLs?: Yes Does the patient have  difficulty dressing or bathing?: Yes Independently performs ADLs?: No Communication: Independent Dressing (OT): Needs assistance Is this a change from baseline?: Pre-admission baseline Grooming: Needs assistance Is this a change from baseline?: Pre-admission baseline Feeding: Independent Bathing: Needs assistance Is this a change from baseline?: Pre-admission baseline Toileting: Needs assistance Is this a change from baseline?: Pre-admission baseline In/Out Bed: Needs assistance Is this a change from baseline?: Pre-admission baseline Walks in Home: Independent with device (comment) Does the patient have difficulty walking or climbing stairs?: Yes Weakness of Legs: Both Weakness of Arms/Hands: None  Permission Sought/Granted Permission sought to share information with : Family Supports                Emotional Assessment       Orientation: : Oriented to Self, Oriented to Place, Oriented to  Time, Oriented to Situation Alcohol / Substance Use: Not Applicable Psych Involvement: No (comment)  Admission diagnosis:  Metastasis to brain ([place]) [C79.31] Brain mass [G93.89] AKI (acute kidney injury) ([place]) [N17.9] Rectal cancer metastasized to liver ([place]) [C20, C78.7] Altered mental status, unspecified altered mental status type [R41.82] AMS (altered mental status) [R41.82] Patient Active Problem List   Diagnosis Date Noted   AKI (acute kidney injury) ([place]) 07/23/2022   Rectal cancer metastasized to liver ([place]) 07/23/2022   Metastasis to brain ([place]) 07/22/2022   AMS (altered mental status) 07/22/2022   Surgical wound, non healing 04/22/2022   Fascial dehiscence of Terrial Rhodes site 04/22/2022   Colostomy complication ([place]) [id number]  Aortic ectasia, 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   Anemia 03/18/2022   Intra-abdominal abscess ([place]) 03/16/2022   Hyponatremia 03/16/2022   Mild protein malnutrition ([place]) 03/16/2022   Hyperglycemia 03/16/2022   Rectal cancer metastasized to lung  ([place]) 02/04/2022   Colorectal carcinoma ([place]) 10/08/2021   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 ([place]) 05/07/2021   Counseling regarding advance care planning and goals of care 05/07/2021   Port-A-Cath in place 03/05/2021   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[place])    Malnutrition of moderate degree 12/04/2020   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 11/30/2020   [place] speaking patient - Prefers [place] or [place] 11/30/2020   Fourniers gangrene 11/30/2020   Perianal abscess    Perirectal abscess 11/03/2020   Cellulitis 11/03/2020   Proctitis 11/03/2020   Sepsis ([place]) 11/03/2020   Tobacco abuse 11/03/2020   PCP:  Patient, No Pcp Per Pharmacy:   [place] [address] Phone: [phone] Fax: [phone]  [facility] [place], [place] - [address] [place] [address] Phone: [phone] Fax: [phone]     Social Determinants of Health (SDOH) Social History: [place]: No Food Insecurity (07/23/2022)  Housing: Low Risk  (07/23/2022)  Transportation Needs: No Transportation Needs (07/23/2022)  Utilities: Not At Risk (07/23/2022)  Depression (PHQ2-9): Medium Risk (01/31/2021)  Tobacco Use: High Risk (07/23/2022)   SDOH Interventions: Housing Interventions: Patient Refused   Readmission Risk Interventions    03/22/2022   12:38 PM  Readmission Risk Prevention Plan  Transportation Screening Complete  PCP or Specialist Appt within 5-7 Days Complete  Home Care Screening Complete  Medication Review (RN CM) Complete

## 2022-07-24 NOTE — Progress Notes (Signed)
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  Foley placed, is started on Decadron.    Subjective:  No acute interval changes  Improving, less confused, no agitation, cr normalized, good urine output   Assessment & Plan:  Principal Problem:   AMS (altered mental status) Active Problems:   AKI (acute kidney injury) ([place])   Rectal cancer metastasized to liver [facility])    Assessment and Plan:   Acute metabolic encephalopathy likely from AKI and brain mets -AKI resolved, less confused, continued on Decadron for brain mets  AKI, likely dehydration and postobstructive (bilateral hydronephrosis and hydroureter distended bladder status post Foley insertion on 2/14 ) Creatinine normalized with IV hydration and Foley insertion Nephrology Signed off Needs urology follow up  Metabolic acidosis Hyponatremia Resolved after hydration and correcting AKI  Normocytic anemia Likely multifactorial from GI blood loss, anemia of chronic disease Appears stable above 8 Monitor  Metastatic rectal cancer s/p APR with ileostomy/peristomal hernia, s/p resection of liver mets and thermal ablation of segment liver mets , s/p  SBRT of lung mets presents with altered mental status, 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 [name] outpatient Local wound care with wound care specialist guidance       .  I have Reviewed nursing  notes, Vitals, pain scores, I/o's, Lab results and  imaging results since pt's last encounter, details please see discussion above  I ordered the following labs:  Unresulted Labs (From admission, onward)     Start     Ordered   07/25/22 0500  Iron and TIBC  Tomorrow morning,   R       Question:  Specimen collection method  Answer:  IV Team=IV Team collect   07/24/22 1719   07/25/22 0500  Vitamin B12  Tomorrow morning,   R       Question:  Specimen collection method  Answer:  IV Team=IV Team collect   07/24/22 1719   07/23/22 0500  Renal function panel  Daily,   R      07/22/22 1232             DVT prophylaxis: heparin injection 5,000 Units Start: 07/22/22 0600   Code Status:   Code Status: Full Code  Family Communication: daughter over the phone  Disposition:   Dispo: The patient is from: Home              Anticipated d/c is to: Home              Anticipated d/c date is: TBD, needs radiation oncology clearance  Antimicrobials:   Anti-infectives (From admission, onward)    None           Objective: Vitals:   07/23/22 1818 07/23/22 2103 07/24/22 0403 07/24/22 1401  BP: 116/72 105/72 92/60 104/68  Pulse: (!) 59 60 (!) 55 60  Resp: 18 19 19 20  $ Temp: 98.3 F (36.8 C) 97.8 F (  36.6 C) (!) 97.4 F (36.3 C) 98 F (36.7 C)  TempSrc: Oral   Oral  SpO2:  100% 99% 99%    Intake/Output Summary (Last 24 hours) at 07/24/2022 1730 Last data filed at 07/24/2022 1400 Gross per 24 hour  Intake 105.35 ml  Output 1800 ml  Net -1694.65 ml   There were no vitals filed for this visit.  Examination:  General exam: Slightly confused about the month but able to self-correct Respiratory system: Clear to auscultation. Respiratory effort normal. Cardiovascular system:  RRR.  Gastrointestinal system: Abdomen is nondistended, soft and nontender.  Normal bowel sounds heard. Central nervous system: Alert and oriented. No focal neurological deficits. Extremities:  no  edema Skin: Wound dressing on to rectum gluteal fold Psychiatry: Calm and cooperative    Data Reviewed: I have personally reviewed  labs and visualized  imaging studies since the last encounter and formulate the plan        Scheduled Meds:  Chlorhexidine Gluconate Cloth  6 each Topical Daily   dexamethasone (DECADRON) injection  6 mg Intravenous Q6H   heparin  5,000 Units Subcutaneous Q8H   levETIRAcetam  500 mg Oral BID   LORazepam  0.5 mg Oral QHS   pantoprazole (PROTONIX) IV  40 mg Intravenous Q24H   Continuous Infusions:  lactated ringers 50 mL/hr at 07/24/22 1203     LOS: 2 days   Time spent: 13mns  FFlorencia Reasons MD PhD FACP [facility]  Available via Epic secure chat 7am-7pm for nonurgent issues Please page for urgent issues To page the attending provider between 7A-7P or the covering provider during after hours 7P-7A, please log into the web site [email or web address] and access using universal [place] password for that web site. If you do not have the password, please call the hospital operator.    07/24/2022, 5:30 PM

## 2022-07-25 ENCOUNTER — Other Ambulatory Visit (HOSPITAL_COMMUNITY): Payer: Self-pay

## 2022-07-25 DIAGNOSIS — N1330 Unspecified hydronephrosis: Secondary | ICD-10-CM | POA: Diagnosis not present

## 2022-07-25 DIAGNOSIS — N179 Acute kidney failure, unspecified: Secondary | ICD-10-CM | POA: Diagnosis not present

## 2022-07-25 DIAGNOSIS — R338 Other retention of urine: Secondary | ICD-10-CM | POA: Diagnosis not present

## 2022-07-25 DIAGNOSIS — G9341 Metabolic encephalopathy: Secondary | ICD-10-CM | POA: Diagnosis not present

## 2022-07-25 LAB — IRON AND TIBC
Iron: 69 ug/dL (ref 45–182)
Saturation Ratios: 27 % (ref 17.9–39.5)
TIBC: 258 ug/dL (ref 250–450)
UIBC: 189 ug/dL

## 2022-07-25 LAB — VITAMIN B12: Vitamin B-12: 168 pg/mL — ABNORMAL LOW (ref 180–914)

## 2022-07-25 LAB — RENAL FUNCTION PANEL
Albumin: 2.4 g/dL — ABNORMAL LOW (ref 3.5–5.0)
Anion gap: 8 (ref 5–15)
BUN: 21 mg/dL — ABNORMAL HIGH (ref 6–20)
CO2: 24 mmol/L (ref 22–32)
Calcium: 9.1 mg/dL (ref 8.9–10.3)
Chloride: 105 mmol/L (ref 98–111)
Creatinine, Ser: 0.8 mg/dL (ref 0.61–1.24)
GFR, Estimated: >60 mL/min (ref >60)
Glucose, Bld: 161 mg/dL — ABNORMAL HIGH (ref 70–99)
Phosphorus: 2.9 mg/dL (ref 2.5–4.6)
Potassium: 4.1 mmol/L (ref 3.5–5.1)
Sodium: 137 mmol/L (ref 135–145)

## 2022-07-25 MED ORDER — TAMSULOSIN HCL 0.4 MG PO CAPS: 0.4000 mg | ORAL_CAPSULE | Freq: Every day | ORAL | 0 refills | Status: DC

## 2022-07-25 MED ORDER — LEVETIRACETAM 500 MG PO TABS: 500.0000 mg | ORAL_TABLET | Freq: Two times a day (BID) | ORAL | 0 refills | Status: DC

## 2022-07-25 MED ORDER — DEXAMETHASONE 4 MG PO TABS: 4.0000 mg | ORAL_TABLET | Freq: Three times a day (TID) | ORAL | 0 refills | Status: DC

## 2022-07-25 MED ORDER — HEPARIN SOD (PORK) LOCK FLUSH 100 UNIT/ML IV SOLN
500.0000 [IU] | INTRAVENOUS | Status: AC | PRN
  Administered 2022-07-25: 500 [IU]

## 2022-07-25 MED ORDER — PANTOPRAZOLE SODIUM 40 MG PO TBEC: 40.0000 mg | DELAYED_RELEASE_TABLET | Freq: Every day | ORAL | 0 refills | Status: DC

## 2022-07-25 MED ORDER — VITAMIN B-12 1000 MCG PO TABS: 1000.0000 ug | ORAL_TABLET | Freq: Every day | ORAL | 0 refills | Status: DC

## 2022-07-25 NOTE — Discharge Summary (Signed)
Discharge Summary  James Coffey [id number] DOB: 06/09/1970  PCP: Patient, No Pcp Per  Admit date: 07/21/2022 Discharge date: 07/25/2022  30 Day Unplanned Readmission Risk Score    Flowsheet Row ED to Hosp-Admission (Current) from 07/21/2022 in [place] [facility]  30 Day Unplanned Readmission Risk Score (%) 28.44 Filed at 07/25/2022 0800       This score is the patient's risk of an unplanned readmission within 30 days of being discharged (0 -100%). The score is based on dignosis, age, lab data, medications, orders, and past utilization.   Low:  0-14.9   Medium: 15-21.9   High: 22-29.9   Extreme: 30 and above         Time spent: 56mns, more than 50% time spent on coordination of care.   Recommendations for Outpatient Follow-up:  F/u with medical oncology, radiation oncology , neurosurgery , urology, general surgery and palliative care Discharge with foley, Voiding trial next week     Discharge Diagnoses:  Active Hospital Problems   Diagnosis Date Noted   AMS (altered mental status) 07/22/2022   [place]   Rectal cancer metastasized to liver ([facility] 07/23/2022    Resolved Hospital Problems  No resolved problems to display.    Discharge Condition: stable  Diet recommendation: regular diet   There were no vitals filed for this visit.  History of present illness: ( per admitting provider Dr James Coffey Chief Complaint: Altered mental status.   HPI: James Coffey a 52y.o. male with medical history significant for oligometastatic rectal cancer status postchemotherapy, with 2 visible liver metastases, bilateral pulmonary metastases, initially diagnosed with locally advanced rectal adenocarcinoma, iron deficiency anemia, recurrent perianal abscess(hospitalized in October for pelvic abscess status post abdominal perineal resection) followed by Dr. TMarcello Coffey who presented to the ED from home via EMS due to altered  mental status.  Reportedly according to the daughter he has been more confused lately, more forgetful and not making much sense with his communication.  Associated with poor oral intake over the past 2 days and decreased colostomy output.  The patient had abnormal labs results at his last checkup with a creatinine of 11 from normal baseline.  The patient was brought into the ED for further evaluation.   In the ED, the patient denies having any pain.  Denies any headache or change in his vision.  No nausea or vomiting.  He is alert and oriented x 1.  Noncontrast CT head revealed showed a 3 cm left frontal lobe mass with surrounding edema with local mass effect 6 mm midline shift.  CT abdomen and pelvis without contrast shows bilateral hydronephrosis with distended bladder.  A Foley catheter was inserted with 1300 of urine output.  EDP discussed the case with Dr. KIrene Limbowho recommended IV Decadron 10 mg now then 6 mg every 6 hours for his brain edema.  EDP also discussed the case with nephrology Dr. [facility]who [facility].  [facility] requested [facility].  [name]   ED Course: Tmax 98.6.  BP 120/80, pulse 72, respiratory 17, O2 saturation 96% on room air.  Lab studies significant for serum sodium 133, bicarb 18, glucose 147, BUN 68, creatinine 13.42 from 11.07, albumin 2.7, T. bili 1.4, GFR 4.  WBC 11.1.  Hemoglobin 8.3, MCV 87.  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   AKI, likely dehydration and postobstructive (bilateral hydronephrosis and hydroureter distended bladder status post Foley insertion on 2/14 ) Creatinine normalized with  IV hydration and Foley insertion Nephrology Signed off F/u with urology and catheter clinic for voiding trial    Metabolic acidosis Hyponatremia Resolved after hydration and correcting AKI   Normocytic anemia Likely multifactorial from GI blood loss, anemia of chronic disease Appears stable above 8 Monitor  B12 deficiency: start b12 supplement   Metastatic rectal cancer s/p APR with ileostomy/peristomal hernia, s/p resection of liver mets and thermal ablation of segment liver mets , s/p  SBRT of lung mets presents with altered mental status, 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     History of recurrent perianal abscess, followed by general surgery Dr. Marcello Coffey outpatient Local wound care with wound care specialist guidance      Discharge Exam: BP (!) 104/59 (BP Location: Left Arm)   Pulse (!) 59   Temp 98 F (36.7 C) (Oral)   Resp 20   SpO2 98%   General: NAD, appear frustrated , confusion has almost resolved,  Cardiovascular: RRR Respiratory: normal respiratory effort     Discharge Instructions     Amb Referral to Palliative Care   Complete by: As directed    Diet general   Complete by: As directed    Discharge wound care:   Complete by: As directed    Tuck small piece of gauze in gluteal fold near rectum and change Q day or PRN when soiled Follow-up with Dr. [name]   Increase activity slowly   Complete by: As directed       Allergies as of 07/25/2022       Reactions   Hydrocodone Itching   Pork-derived Products Other (See Comments)        Medication List     STOP taking these medications    zolpidem 10 MG tablet Commonly known as:  AMBIEN       TAKE these medications    acetaminophen 500 MG tablet Commonly known as: TYLENOL Take 2 tablets (1,000 mg total) by mouth every 8 (eight) hours as needed for mild pain or fever. What changed: when to take this   cyanocobalamin 1000 MCG tablet Commonly known as: VITAMIN B12 Take 1 tablet (1,000 mcg total) by mouth daily.   dexamethasone 4 MG tablet Commonly known as: DECADRON Take 1 tablet (4 mg total) by mouth 3 (three) times daily.   levETIRAcetam 500 MG tablet Commonly known as: KEPPRA Take 1 tablet (500 mg total) by mouth 2 (two) times daily.   LORazepam 0.5 MG tablet Commonly known as: ATIVAN Take 1 tablet (0.5 mg total) by mouth at bedtime. What changed:  when to take this reasons to take this   oxyCODONE 5 MG immediate release tablet Commonly known as: Oxy IR/ROXICODONE Take 1 tablet (5 mg total) by mouth every 6 (six) hours as needed for moderate pain or severe  pain. What changed: when to take this   pantoprazole 40 MG tablet Commonly known as: Protonix Take 1 tablet (40 mg total) by mouth daily.   polyethylene glycol 17 g packet Commonly known as: MiraLax Take 17 g by mouth daily. Take daily to maintain regular soft colostomy output. Can increase to twice a day if need. What changed:  when to take this reasons to take this additional instructions   prochlorperazine 10 MG tablet Commonly known as: COMPAZINE Take 1 tablet (10 mg total) by mouth every 6 (six) hours as needed for nausea or vomiting. What changed: when to take this   senna-docusate 8.6-50 MG tablet Commonly known as: Senna S Take 2 tablets by mouth at bedtime.   tamsulosin 0.4 MG Caps capsule Commonly known as: Flomax Take 1 capsule (0.4 mg total) by mouth daily after supper.               Discharge Care Instructions  (From admission, onward)           Start     Ordered   07/25/22 0000  Discharge wound care:       Comments: Tuck small piece of gauze in  gluteal fold near rectum and change Q day or PRN when soiled Follow-up with Dr. [name]   [id number]           Allergies  Allergen Reactions   Hydrocodone Itching   Pork-Derived Products Other (See Comments)    Follow-up Information     [facility] Follow up in 1 week(s).   Why: for bilateral hydronephrosis and hydroureter, urinary retention catheter clinic for voiding trial Raelyn Number management Contact information: [place]        Kyung Rudd, MD Follow up.   Specialty: Radiation Oncology Contact information: [id number] [place] [address] [phone]         [name], MD Follow up.   Specialties: Hematology, Oncology Contact information: [place] [address] [phone]         [name], MD Follow up.   Specialties: General Surgery, Colon and Rectal Surgery Why: for wound care Contact information: [place] [address] [phone]         Jimmy Footman, NP. Schedule an appointment as soon as possible for a visit in 3 week(s).   Specialties: Nurse Practitioner, Pediatric Ophthalmology Why: outpatient palliative care follow up Contact information: [place] [id number] [phone]         [name], MD Follow up.   Specialty: Neurosurgery Contact information: [address] [place] [address] [phone]         [name], MD Follow up.   Specialty: Urology Why: for bilateral hydronephrosis and hydroureter, urinary retention Contact information: [place] [address] [phone]                  The results of significant diagnostics from this hospitalization (including imaging, microbiology, ancillary and laboratory) are listed below for reference.    Significant Diagnostic  Studies: MR BRAIN W WO CONTRAST  Result Date: 07/23/2022 CLINICAL DATA:  Brain/CNS neoplasm, staging SRS with BRAIN LAB scan at [facility] EXAM: MRI HEAD WITHOUT AND WITH CONTRAST TECHNIQUE: Multiplanar, multiecho pulse sequences of the brain and surrounding structures were obtained without and with intravenous contrast. CONTRAST:  7.84m GADAVIST  GADOBUTROL 1 MMOL/ML IV SOLN COMPARISON:  CT head from July 21, 2022. FINDINGS: Brain: 3.0 x 3.4 by 2.9 cm peripherally enhancing mass in the left frontal lobe superiorly. Surrounding edema and mass effect with approximately 7 mm of rightward midline shift at this site. Mild effacement the left lateral ventricle. No evidence of hydrocephalus, acute hemorrhage, acute infarct or extra-axial fluid collection. Vascular: Major arterial flow voids are maintained at the skull base. Skull and upper cervical spine: Normal marrow signal. Sinuses/Orbits: Clear sinuses.  No acute orbital findings. Other: No mastoid effusions. IMPRESSION: Findings compatible with a single 3.4 cm metastasis in the left frontal lobe, described above. Electronically Signed   By: Margaretha Sheffield M.D.   On: 07/23/2022 17:17   CT ABDOMEN PELVIS WO CONTRAST  Result Date: 07/22/2022 CLINICAL DATA:  Acute nonlocalized abdominal pain. Left-sided abdominal distention. EXAM: CT ABDOMEN AND PELVIS WITHOUT CONTRAST TECHNIQUE: Multidetector CT imaging of the abdomen and pelvis was performed following the standard protocol without IV contrast. RADIATION DOSE REDUCTION: This exam was performed according to the departmental dose-optimization program which includes automated exposure control, adjustment of the mA and/or kV according to patient size and/or use of iterative reconstruction technique. COMPARISON:  07/13/2022 FINDINGS: Lower chest: Motion artifact limits evaluation. Lobulated nodule in the left lingula measuring 1.9 cm diameter, unchanged. This is likely metastatic. Hepatobiliary: Liver lesions are  again demonstrated although poorly defined on noncontrast imaging. Largest mass is in the dome of the liver, measuring about 7.8 cm diameter. This corresponds to known liver metastasis. There is limited ability to compare the lesion to the previous contrast-enhanced scan. Partial left hepatic resection. Gallbladder and bile ducts are unremarkable. Pancreas: Unremarkable. No pancreatic ductal dilatation or surrounding inflammatory changes. Spleen: Normal in size without focal abnormality. Adrenals/Urinary Tract: No adrenal gland nodules. There is mild bilateral hydronephrosis. No renal or ureteral stones are demonstrated. The bladder is distended without wall thickening. Changes likely to represent urine stasis or reflux. Stomach/Bowel: Stomach, small bowel, and colon are not abnormally distended. There is a partial colonic resection with left lower quadrant descending colostomy. There is a large peristomal hernia containing small and large bowel. This appears similar to the prior study. Soft tissue infiltration and fluid in the presacral space with infiltration or scarring into the surrounding perirectal fat. This appearance is similar to the prior study and could represent either or combination of residual/recurrent tumor and postoperative change. Continued attention on follow-up imaging is suggested. Vascular/Lymphatic: Aortic atherosclerosis. No enlarged abdominal or pelvic lymph nodes. Reproductive: Prostate is unremarkable. Other: No free air or free fluid in the abdomen. Musculoskeletal: Degenerative changes in the spine and hips. No destructive bone lesions. IMPRESSION: 1. No significant change since prior study. 2. Again demonstrated are metastatic lesions throughout the liver and in the left lingula. 3. Postoperative changes with partial colectomy. Left lower quadrant ileostomy with large peristomal hernia, similar to prior study. No evidence of proximal obstruction. Gas and soft tissue in the presacral  space may represent post treatment changes or residual/recurrent tumor. Follow-up as indicated. 4. Bilateral hydronephrosis and hydroureter with distended bladder, likely representing reflux disease. Bladder distention could be physiologic or due to outlet obstruction or decreased motility. Electronically Signed   By: Lucienne Capers M.D.   On: 07/22/2022 00:11   CT Head Wo Contrast  Result Date: 07/21/2022 CLINICAL DATA:  Altered mental status EXAM: CT HEAD WITHOUT CONTRAST TECHNIQUE: Contiguous axial images were obtained from the base of the skull through the vertex without intravenous contrast.  RADIATION DOSE REDUCTION: This exam was performed according to the departmental dose-optimization program which includes automated exposure control, adjustment of the mA and/or kV according to patient size and/or use of iterative reconstruction technique. COMPARISON:  None Available. FINDINGS: Brain: Hyperdense left frontal lobe mass measuring by 3 x 2.8 x 2.5 cm. Considerable surrounding edema and local mass effect. 6 mm midline shift to the right. Compression of the right frontal horn of left lateral ventricle. Diffuse sulcal swelling on the left consistent with edema Vascular: No hyperdense vessels.  Carotid vascular calcification Skull: Normal. Negative for fracture or focal lesion. Sinuses/Orbits: No acute finding. Other: None IMPRESSION: 3 cm hyperdense left frontal lobe mass with prominent surrounding edema and local mass effect. 6 mm midline shift to the right. Findings favored to represent metastatic disease over primary brain neoplasm given history of metastatic rectal cancer. Critical Value/emergent results were called by telephone at the time of interpretation on 07/21/2022 at 11:31 pm to provider PA Deno Etienne , who verbally acknowledged these results. Electronically Signed   By: Donavan Foil M.D.   On: 07/21/2022 23:35   CT CHEST ABDOMEN PELVIS W CONTRAST  Result Date: 07/13/2022 CLINICAL DATA:   Follow-up metastatic rectal carcinoma. Abdominal pain. Completed chemotherapy. Previous ablation of the hepatic metastasis. Restaging. * Tracking Code: BO * EXAM: CT CHEST, ABDOMEN, AND PELVIS WITH CONTRAST TECHNIQUE: Multidetector CT imaging of the chest, abdomen and pelvis was performed following the standard protocol during bolus administration of intravenous contrast. RADIATION DOSE REDUCTION: This exam was performed according to the departmental dose-optimization program which includes automated exposure control, adjustment of the mA and/or kV according to patient size and/or use of iterative reconstruction technique. CONTRAST:  120m OMNIPAQUE IOHEXOL 300 MG/ML  SOLN COMPARISON:  Chest CT on 03/20/2022, and AP CT on 06/24/2022 FINDINGS: CT CHEST FINDINGS Cardiovascular: No acute findings. Aortic and coronary atherosclerotic calcification incidentally noted. Mediastinum/Lymph Nodes: No masses or pathologically enlarged lymph nodes identified. Lungs/Pleura: Pulmonary nodule in the lingula measuring 2.3 x 1.7 cm shows no significant change since prior study. Central right upper lobe pulmonary nodule measuring 1.5 x 0.9 cm is also stable since prior study. Upper lobe pleural-parenchymal scarring shows no significant change. No new or enlarging pulmonary nodules or masses identified. No evidence of pleural effusion. Musculoskeletal:  No suspicious bone lesions identified. CT ABDOMEN AND PELVIS FINDINGS Hepatobiliary: Postop changes from left hepatic lobe wedge resection remains stable. No evidence of recurrent mass at this site. Ablation defect in the anterior right hepatic lobe also remains stable, without evidence of recurrence at this site. A large hypovascular mass in the medial liver dome shows mild increase in size, currently measuring 7.6 x 6.1 cm, compared to 6.6 x 5.8 cm previously. Another hypovascular mass in the lateral liver dome currently measures 5.2 x 3.5 cm, compared to 5.0 by 3.3 cm previously.  Gallbladder is unremarkable. No evidence of biliary ductal dilatation. Pancreas:  No mass or inflammatory changes. Spleen:  Within normal limits in size and appearance. Adrenals/Urinary tract: No suspicious masses or hydronephrosis. Urinary bladder remains mildly distended, but is unchanged in appearance. Stomach/Bowel: Postop changes again seen from left lower quadrant end colostomy. Large left lower quadrant parastomal hernia is again seen containing multiple small bowel loops. Fluid and gas collection in the presacral space measures 4.7 x 2.1 cm, without significant change since prior study. Asymmetric soft tissue prominence along the left lateral inferior margin of this collection remains stable and could be due to tumor or post treatment changes.  Vascular/Lymphatic: No pathologically enlarged lymph nodes identified. No acute vascular findings. Aortic atherosclerotic calcification incidentally noted. Reproductive:  No mass or other significant abnormality identified. Other:  None. Musculoskeletal:  No suspicious bone lesions identified. IMPRESSION: Mild increase in size of liver metastases in the medial liver dome. No other new or increased sites of metastatic disease identified. Stable bilateral pulmonary metastases. Stable fluid and gas collection in presacral space. Asymmetric soft tissue prominence along the left lateral inferior margin of this collection also remains stable, and could be due to tumor or post treatment changes. Continued attention on follow-up imaging recommended. Stable large left lower quadrant parastomal hernia containing multiple small bowel loops. Aortic Atherosclerosis (ICD10-I70.0). Electronically Signed   By: Marlaine Hind M.D.   On: 07/13/2022 14:06    Microbiology: Recent Results (from the past 240 hour(s))  Resp panel by RT-PCR (RSV, Flu A&B, Covid) Anterior Nasal Swab     Status: None   Collection Time: 07/21/22 11:37 PM   Specimen: Anterior Nasal Swab  Result Value Ref  Range Status   SARS Coronavirus 2 by RT PCR NEGATIVE NEGATIVE Final    Comment: (NOTE) SARS-CoV-2 target nucleic acids are NOT DETECTED.  The SARS-CoV-2 RNA is generally detectable in upper respiratory specimens during the acute phase of infection. The lowest concentration of SARS-CoV-2 viral copies this assay can detect is 138 copies/mL. A negative result does not preclude SARS-Cov-2 infection and should not be used as the sole basis for treatment or other patient management decisions. A negative result may occur with  improper specimen collection/handling, submission of specimen other than nasopharyngeal swab, presence of viral mutation(s) within the areas targeted by this assay, and inadequate number of viral copies(<138 copies/mL). A negative result must be combined with clinical observations, patient history, and epidemiological information. The expected result is Negative.  Fact Sheet for Patients:  EntrepreneurPulse.com.au  Fact Sheet for Healthcare Providers:  IncredibleEmployment.be  This test is no t yet approved or cleared by the [place] FDA and  has been authorized for detection and/or diagnosis of SARS-CoV-2 by FDA under an Emergency Use Authorization (EUA). This EUA will remain  in effect (meaning this test can be used) for the duration of the COVID-19 declaration under Section 564(b)(1) of the Act, 21 U.S.C.section 360bbb-3(b)(1), unless the authorization is terminated  or revoked sooner.       Influenza A by PCR NEGATIVE NEGATIVE Final   Influenza B by PCR NEGATIVE NEGATIVE Final    Comment: (NOTE) The Xpert Xpress SARS-CoV-2/FLU/RSV plus assay is intended as an aid in the diagnosis of influenza from Nasopharyngeal swab specimens and should not be used as a sole basis for treatment. Nasal washings and aspirates are unacceptable for Xpert Xpress SARS-CoV-2/FLU/RSV testing.  Fact Sheet for  Patients: EntrepreneurPulse.com.au  Fact Sheet for Healthcare Providers: IncredibleEmployment.be  This test is not yet approved or cleared by the [place] FDA and has been authorized for detection and/or diagnosis of SARS-CoV-2 by FDA under an Emergency Use Authorization (EUA). This EUA will remain in effect (meaning this test can be used) for the duration of the COVID-19 declaration under Section 564(b)(1) of the Act, 21 U.S.C. section 360bbb-3(b)(1), unless the authorization is terminated or revoked.     Resp Syncytial Virus by PCR NEGATIVE NEGATIVE Final    Comment: (NOTE) Fact Sheet for Patients: EntrepreneurPulse.com.au  Fact Sheet for Healthcare Providers: IncredibleEmployment.be  This test is not yet approved or cleared by the [place] FDA and has been authorized for detection and/or  diagnosis of SARS-CoV-2 by FDA under an Emergency Use Authorization (EUA). This EUA will remain in effect (meaning this test can be used) for the duration of the COVID-19 declaration under Section 564(b)(1) of the Act, 21 U.S.C. section 360bbb-3(b)(1), unless the authorization is terminated or revoked.  Performed at [facility], [place] [address], [place], [place] [address]      Labs: Basic Metabolic Panel: Recent Labs  Lab 07/21/22 2226 07/22/22 0408 07/22/22 1444 07/23/22 0859 07/24/22 0601 07/25/22 0500  NA 133*  --  140 140 139 137  K 5.1  --  4.9 4.7 4.7 4.1  CL 103  --  108 110 107 105  CO2 18*  --  22 21* 23 24  GLUCOSE 147*  --  215* 172* 169* 161*  BUN 68*  --  30* 18 17 21*  CREATININE 13.42* 7.49* 2.46* 0.98 0.94 0.80  CALCIUM 8.6*  --  9.1 9.4 9.3 9.1  MG  --   --   --  2.5*  --   --   PHOS  --   --   --  3.1 3.7 2.9   Liver Function Tests: Recent Labs  Lab 07/20/22 0910 07/21/22 2226 07/23/22 0859 07/24/22 0601 07/25/22 0500  AST 13* 14*  --   --   --   ALT  14 14  --   --   --   ALKPHOS 136* 118  --   --   --   BILITOT 1.0 1.4*  --   --   --   PROT 7.6 7.6  --   --   --   ALBUMIN 3.2* 2.7* 2.5* 2.8* 2.4*   Recent Labs  Lab 07/21/22 2226  LIPASE 30   Recent Labs  Lab 07/21/22 2337  AMMONIA 11   CBC: Recent Labs  Lab 07/20/22 0910 07/21/22 2226 07/22/22 0408 07/23/22 0500  WBC 9.9 11.1* 9.4 9.3  NEUTROABS 8.1* 9.4*  --   --   HGB 8.8* 8.3* 8.8* 8.5*  HCT 28.0* 26.9* 28.6* 28.7*  MCV 85.6 87.6 88.0 88.6  PLT 313 308 317 379   Cardiac Enzymes: No results for input(s): "CKTOTAL", "CKMB", "CKMBINDEX", "TROPONINI" in the last 168 hours. BNP: BNP (last 3 results) No results for input(s): "BNP" in the last 8760 hours.  ProBNP (last 3 results) No results for input(s): "PROBNP" in the last 8760 hours.  CBG: No results for input(s): "GLUCAP" in the last 168 hours.  FURTHER DISCHARGE INSTRUCTIONS:   Get Medicines reviewed and adjusted: Please take all your medications with you for your next visit with your Primary MD   Laboratory/radiological data: Please request your Primary MD to go over all hospital tests and procedure/radiological results at the follow up, please ask your Primary MD to get all Hospital records sent to his/her office.   In some cases, they will be blood work, cultures and biopsy results pending at the time of your discharge. Please request that your primary care M.D. goes through all the records of your hospital data and follows up on these results.   Also Note the following: If you experience worsening of your admission symptoms, develop shortness of breath, life threatening emergency, suicidal or homicidal thoughts you must seek medical attention immediately by calling 911 or calling your MD immediately  if symptoms less severe.   You must read complete instructions/literature along with all the possible adverse reactions/side effects for all the Medicines you take and that have been prescribed to  you.  Take any new Medicines after you have completely understood and accpet all the possible adverse reactions/side effects.    Do not drive when taking Pain medications or sleeping medications (Benzodaizepines)   Do not take more than prescribed Pain, Sleep and Anxiety Medications. It is not advisable to combine anxiety,sleep and pain medications without talking with your primary care practitioner   Special Instructions: If you have smoked or chewed Tobacco  in the last 2 yrs please stop smoking, stop any regular Alcohol  and or any Recreational drug use.   Wear Seat belts while driving.   Please note: You were cared for by a hospitalist during your hospital stay. Once you are discharged, your primary care physician will handle any further medical issues. Please note that NO REFILLS for any discharge medications will be authorized once you are discharged, as it is imperative that you return to your primary care physician (or establish a relationship with a primary care physician if you do not have one) for your post hospital discharge needs so that they can reassess your need for medications and monitor your lab values.     Signed:  Florencia Reasons MD, PhD, FACP  [facility] 07/25/2022, 11:33 AM

## 2022-07-25 NOTE — Plan of Care (Signed)
Education and discharge packet reviewed and given to patient and spouse at bedside. Medications that were sent to the bedside from pharmacy were given to the patient and then handed to patient's spouse by patient at the bedside. No questions or concerns were noted by patient or patient's spouse. Patient condition stable at discharge.   Problem: Education: Goal: Knowledge of General Education information will improve Description: Including pain rating scale, medication(s)/side effects and non-pharmacologic comfort measures Outcome: Adequate for Discharge   Problem: Health Behavior/Discharge Planning: Goal: Ability to manage health-related needs will improve Outcome: Adequate for Discharge   Problem: Clinical Measurements: Goal: Ability to maintain clinical measurements within normal limits will improve Outcome: Adequate for Discharge Goal: Will remain free from infection Outcome: Adequate for Discharge Goal: Diagnostic test results will improve Outcome: Adequate for Discharge Goal: Respiratory complications will improve Outcome: Adequate for Discharge Goal: Cardiovascular complication will be avoided Outcome: Adequate for Discharge   Problem: Activity: Goal: Risk for activity intolerance will decrease Outcome: Adequate for Discharge   Problem: Nutrition: Goal: Adequate nutrition will be maintained Outcome: Adequate for Discharge   Problem: Coping: Goal: Level of anxiety will decrease Outcome: Adequate for Discharge   Problem: Elimination: Goal: Will not experience complications related to bowel motility Outcome: Adequate for Discharge Goal: Will not experience complications related to urinary retention Outcome: Adequate for Discharge   Problem: Pain Managment: Goal: General experience of comfort will improve Outcome: Adequate for Discharge   Problem: Safety: Goal: Ability to remain free from injury will improve Outcome: Adequate for Discharge   Problem: Skin  Integrity: Goal: Risk for impaired skin integrity will decrease Outcome: Adequate for Discharge

## 2022-07-25 NOTE — Evaluation (Signed)
Physical Therapy Evaluation Patient Details Name: James Coffey MRN: [id number] DOB: Oct 30, 1970 Today's Date: 07/25/2022  History of Present Illness  52 y.o. male with medical history significant for oligometastatic rectal cancer status postchemotherapy, with 2 visible liver metastases, bilateral pulmonary metastases, initially diagnosed with locally advanced rectal adenocarcinoma, iron deficiency anemia, recurrent perianal abscess(hospitalized in October for pelvic abscess status post abdominal perineal resection).  Now admitted with elevated creatinine to 13.  Found to be retaining urine.  CT head showed 3 cm hyperdense left frontal lobe mass with prominent surrounding edema and local mass effect.  6 mm midline shift to the right.    Plan was for brain radiation but on hold due to his renal function.   Clinical Impression  Patient evaluated by Physical Therapy with no further acute PT needs identified. All education has been completed and the patient has no further questions. Pt completed all activities today at independent level, refuses issuance of DME, and reports "I just want to go home, where is my wife." Reports no recent falls, no issues with mobility, wife helps with bathing and dressing intermittently. See below for any follow-up Physical Therapy or equipment needs. PT is signing off. Thank you for this referral.       Recommendations for follow up therapy are one component of a multi-disciplinary discharge planning process, led by the attending physician.  Recommendations may be updated based on patient status, additional functional criteria and insurance authorization.  Follow Up Recommendations No PT follow up      Assistance Recommended at Discharge Intermittent Supervision/Assistance  Patient can return home with the following  A little help with walking and/or transfers;A little help with bathing/dressing/bathroom;Assistance with cooking/housework    Equipment Recommendations  None recommended by PT (Pt refusing.)  Recommendations for Other Services       Functional Status Assessment Patient has not had a recent decline in their functional status     Precautions / Restrictions Precautions Precautions: Fall Restrictions Weight Bearing Restrictions: No      Mobility  Bed Mobility Overal bed mobility: Independent                  Transfers Overall transfer level: Independent Equipment used: None                    Ambulation/Gait Ambulation/Gait assistance: Independent Gait Distance (Feet): 50 Feet Assistive device: None Gait Pattern/deviations: Step-through pattern, Decreased dorsiflexion - right, Decreased dorsiflexion - left, Drifts right/left Gait velocity: decreased     General Gait Details: Pt ambulated independently without device, supervision, or physical assist carrying foley bag.  Stairs            Wheelchair Mobility    Modified Rankin (Stroke Patients Only)       Balance Overall balance assessment: Modified Independent                                           Pertinent Vitals/Pain Pain Assessment Pain Assessment: No/denies pain    Home Living Family/patient expects to be discharged to:: Private residence Living Arrangements: Spouse/significant other;Children Available Help at Discharge: Family Type of Home: House Home Access: Level entry       Home Layout: One level Home Equipment: None      Prior Function Prior Level of Function : Independent/Modified Independent  Mobility Comments: IND ADLs Comments: reports wife helps with bathing and dressing     Hand Dominance        Extremity/Trunk Assessment   Upper Extremity Assessment Upper Extremity Assessment: Overall WFL for tasks assessed    Lower Extremity Assessment Lower Extremity Assessment: Overall WFL for tasks assessed;RLE deficits/detail;LLE deficits/detail RLE Deficits / Details: MMT hip  3+/5, knee 4/5, ankle 5/5 RLE Sensation: WNL LLE Deficits / Details: MMT hip 3+/5, knee 4/5, ankle 5/5 LLE Sensation: WNL    Cervical / Trunk Assessment Cervical / Trunk Assessment: Kyphotic  Communication   Communication: Prefers language other than [place] ([place], but understands basic english)  Cognition Arousal/Alertness: Awake/alert Behavior During Therapy: WFL for tasks assessed/performed, Agitated Overall Cognitive Status: Within Functional Limits for tasks assessed                                 General Comments: Pt very focused on discharge and cursing wanting to leave        General Comments      Exercises     Assessment/Plan    PT Assessment All further PT needs can be met in the next venue of care  PT Problem List Decreased strength;Decreased activity tolerance;Decreased balance       PT Treatment Interventions      PT Goals (Current goals can be found in the Care Plan section)  Acute Rehab PT Goals Patient Stated Goal: to go home PT Goal Formulation: With patient Time For Goal Achievement: 08/08/22 Potential to Achieve Goals: Good    Frequency       Co-evaluation               AM-PAC PT "6 Clicks" Mobility  Outcome Measure Help needed turning from your back to your side while in a flat bed without using bedrails?: None Help needed moving from lying on your back to sitting on the side of a flat bed without using bedrails?: None Help needed moving to and from a bed to a chair (including a wheelchair)?: None Help needed standing up from a chair using your arms (e.g., wheelchair or bedside chair)?: None Help needed to walk in hospital room?: None Help needed climbing 3-5 steps with a railing? : A Little 6 Click Score: 23    End of Session   Activity Tolerance: Patient tolerated treatment well Patient left: in chair;with call bell/phone within reach Nurse Communication: Mobility status PT Visit Diagnosis: Unsteadiness on  feet (R26.81)    Time: [id number] PT Time Calculation (min) (ACUTE ONLY): 12 min   Charges:   PT Evaluation $PT Eval Low Complexity: [place], PT, DPT [facility] Department Office: [phone] Weekend pager: [phone]  Coolidge Breeze 07/25/2022, 10:16 AM

## 2022-07-25 NOTE — Progress Notes (Signed)
I attest to student documentation.  Elodia Haviland, MSN-RN Nursing Adjunct Faculty [employer] 

## 2022-07-26 ENCOUNTER — Encounter: Payer: Self-pay | Admitting: Hematology

## 2022-07-27 ENCOUNTER — Other Ambulatory Visit (HOSPITAL_COMMUNITY): Payer: Self-pay | Admitting: Interventional Radiology

## 2022-07-27 ENCOUNTER — Other Ambulatory Visit: Payer: Self-pay | Admitting: Radiation Oncology

## 2022-07-27 ENCOUNTER — Other Ambulatory Visit: Payer: Self-pay | Admitting: Interventional Radiology

## 2022-07-27 ENCOUNTER — Telehealth: Payer: Self-pay | Admitting: Hematology

## 2022-07-27 ENCOUNTER — Other Ambulatory Visit: Payer: Self-pay | Admitting: Neurosurgery

## 2022-07-27 ENCOUNTER — Inpatient Hospital Stay: Payer: Medicaid Other

## 2022-07-27 DIAGNOSIS — C19 Malignant neoplasm of rectosigmoid junction: Secondary | ICD-10-CM

## 2022-07-27 DIAGNOSIS — C787 Secondary malignant neoplasm of liver and intrahepatic bile duct: Secondary | ICD-10-CM

## 2022-07-27 NOTE — Telephone Encounter (Signed)
Per 2/16 IB reached out to patients daughter to provide updated time and date of appointments.

## 2022-07-27 NOTE — Progress Notes (Signed)
Has armband been applied?  Yes  Does patient have an allergy to IV contrast dye?: No   Has patient ever received premedication for IV contrast dye?: n/a   Does patient take metformin?: No  If patient does take metformin when was the last dose: n/a   Date of lab work: 07/25/2022 Renal Function Panel BUN: 21 CR: 0.80 eGfr: >60  IV site: Right Chest Port  Has IV site been added to flowsheet?  Yes  BP 119/88   Pulse 95   Temp 98 F (36.7 C)   Resp 18   SpO2 100%

## 2022-07-27 NOTE — Telephone Encounter (Signed)
Per 2/16 IB reached out to patient left voicemail with appointments for patient to call back to confirm or reschedule.

## 2022-07-28 ENCOUNTER — Ambulatory Visit
Admission: RE | Admit: 2022-07-28 | Discharge: 2022-07-28 | Disposition: A | Discharge status: Home / Self Care | Payer: Medicaid Other | Source: Ambulatory Visit | Attending: Radiation Oncology | Admitting: Radiation Oncology

## 2022-07-28 ENCOUNTER — Other Ambulatory Visit: Payer: Medicaid Other

## 2022-07-28 ENCOUNTER — Other Ambulatory Visit: Payer: Self-pay

## 2022-07-28 ENCOUNTER — Other Ambulatory Visit: Payer: Self-pay | Admitting: Interventional Radiology

## 2022-07-28 ENCOUNTER — Ambulatory Visit: Payer: Medicaid Other

## 2022-07-28 VITALS — BP 119/88 | HR 95 | Temp 98.0°F | Resp 18

## 2022-07-28 DIAGNOSIS — C7931 Secondary malignant neoplasm of brain: Secondary | ICD-10-CM | POA: Diagnosis present

## 2022-07-28 DIAGNOSIS — C787 Secondary malignant neoplasm of liver and intrahepatic bile duct: Secondary | ICD-10-CM

## 2022-07-28 DIAGNOSIS — C19 Malignant neoplasm of rectosigmoid junction: Secondary | ICD-10-CM

## 2022-07-28 MED ORDER — HEPARIN SOD (PORK) LOCK FLUSH 100 UNIT/ML IV SOLN
500.0000 [IU] | Freq: Once | INTRAVENOUS | Status: AC
  Administered 2022-07-28: 500 [IU] via INTRAVENOUS

## 2022-07-28 MED ORDER — SODIUM CHLORIDE 0.9% FLUSH
10.0000 mL | Freq: Once | INTRAVENOUS | Status: AC
  Administered 2022-07-28: 10 mL via INTRAVENOUS

## 2022-07-28 MED ORDER — LORAZEPAM 1 MG PO TABS
0.5000 mg | ORAL_TABLET | Freq: Once | ORAL | Status: AC
  Administered 2022-07-28: 0.5 mg via ORAL
  Filled 2022-07-28: qty 0.5

## 2022-07-29 DIAGNOSIS — C7931 Secondary malignant neoplasm of brain: Secondary | ICD-10-CM | POA: Diagnosis not present

## 2022-07-31 ENCOUNTER — Encounter: Payer: Self-pay | Admitting: Radiation Oncology

## 2022-07-31 ENCOUNTER — Other Ambulatory Visit: Payer: Self-pay

## 2022-07-31 ENCOUNTER — Ambulatory Visit
Admission: RE | Admit: 2022-07-31 | Discharge: 2022-07-31 | Disposition: A | Discharge status: Home / Self Care | Payer: Medicaid Other | Source: Ambulatory Visit | Attending: Radiation Oncology | Admitting: Radiation Oncology

## 2022-07-31 DIAGNOSIS — C7931 Secondary malignant neoplasm of brain: Secondary | ICD-10-CM | POA: Diagnosis not present

## 2022-07-31 LAB — RAD ONC ARIA SESSION SUMMARY
Course Elapsed Days: 0
Plan Fractions Treated to Date: 1
Plan Prescribed Dose Per Fraction: 15 Gy
Plan Total Fractions Prescribed: 1
Plan Total Prescribed Dose: 15 Gy
Reference Point Dosage Given to Date: 15 Gy
Reference Point Session Dosage Given: 15 Gy
Session Number: 1

## 2022-07-31 NOTE — Progress Notes (Signed)
Mr. Koen rested with [place] for 30 minutes following his SRS treatment.  Patient denies headache, dizziness, nausea, diplopia or ringing in the ears. Denies fatigue. Patient without complaints. Understands to avoid strenuous activity for the next 24 hours and call [phone] with needs.   BP 103/68 (BP Location: Right Arm, Patient Position: Sitting, Cuff Size: Normal)   Pulse 62   Temp 97.8 F (36.6 C)   Resp 20   SpO2 100%    Talan Gildner M. Leonie Green, BSN

## 2022-07-31 NOTE — Pre-Procedure Instructions (Signed)
Surgical Instructions    Your procedure is scheduled on Wednesday, February 28th.  Report to [facility] Main Entrance "A" at 10:45 A.M., then check in with the Admitting office.  Call this number if you have problems the morning of surgery:  [phone]  If you have any questions prior to your surgery date call [phone]: Open Monday-Friday 8am-4pm If you experience any cold or flu symptoms such as cough, fever, chills, shortness of breath, etc. between now and your scheduled surgery, please notify us at the above number.     Remember:  Do not eat after midnight the night before your surgery  You may drink clear liquids until 09:45 AM the morning of your surgery.   Clear liquids allowed are: Water, Non-Citrus Juices (without pulp), Carbonated Beverages, Clear Tea, Black Coffee Only (NO MILK, CREAM OR POWDERED CREAMER of any kind), and Gatorade.    Take these medicines the morning of surgery with A SIP OF WATER  dexamethasone (DECADRON)  levETIRAcetam (KEPPRA)  pantoprazole (PROTONIX)    If needed: acetaminophen (TYLENOL)  LORazepam (ATIVAN)  oxyCODONE (OXY IR/ROXICODONE)  prochlorperazine (COMPAZINE)    As of today, STOP taking any Aspirin (unless otherwise instructed by your surgeon) Aleve, Naproxen, Ibuprofen, Motrin, Advil, Goody's, BC's, all herbal medications, fish oil, and all vitamins.                     Do NOT Smoke (Tobacco/Vaping) for 24 hours prior to your procedure.  If you use a CPAP at night, you may bring your mask/headgear for your overnight stay.   Contacts, glasses, piercing's, hearing aid's, dentures or partials may not be worn into surgery, please bring cases for these belongings.    For patients admitted to the hospital, discharge time will be determined by your treatment team.   Patients discharged the day of surgery will not be allowed to drive home, and someone needs to stay with them for 24 hours.  SURGICAL WAITING ROOM VISITATION Patients  having surgery or a procedure may have no more than 2 support people in the waiting area - these visitors may rotate.   Children under the age of 68 must have an adult with them who is not the patient. If the patient needs to stay at the hospital during part of their recovery, the visitor guidelines for inpatient rooms apply. Pre-op nurse will coordinate an appropriate time for 1 support person to accompany patient in pre-op.  This support person may not rotate.   Please refer to the [facility] website for the visitor guidelines for Inpatients (after your surgery is over and you are in a regular room).    Special instructions:   [facility]- Preparing For Surgery  Before surgery, you can play an important role. Because skin is not sterile, your skin needs to be as free of germs as possible. You can reduce the number of germs on your skin by washing with CHG (chlorahexidine gluconate) Soap before surgery.  CHG is an antiseptic cleaner which kills germs and bonds with the skin to continue killing germs even after washing.    Oral Hygiene is also important to reduce your risk of infection.  Remember - BRUSH YOUR TEETH THE MORNING OF SURGERY WITH YOUR REGULAR TOOTHPASTE  Please do not use if you have an allergy to CHG or antibacterial soaps. If your skin becomes reddened/irritated stop using the CHG.  Do not shave (including legs and underarms) for at least 48 hours prior to first CHG shower.  It is OK to shave your face.  Please follow these instructions carefully.   Shower the NIGHT BEFORE SURGERY and the MORNING OF SURGERY  If you chose to wash your hair, wash your hair first as usual with your normal shampoo.  After you shampoo, rinse your hair and body thoroughly to remove the shampoo.  Use CHG Soap as you would any other liquid soap. You can apply CHG directly to the skin and wash gently with a scrungie or a clean washcloth.   Apply the CHG Soap to your body ONLY FROM THE NECK DOWN.  Do  not use on open wounds or open sores. Avoid contact with your eyes, ears, mouth and genitals (private parts). Wash Face and genitals (private parts)  with your normal soap.   Wash thoroughly, paying special attention to the area where your surgery will be performed.  Thoroughly rinse your body with warm water from the neck down.  DO NOT shower/wash with your normal soap after using and rinsing off the CHG Soap.  Pat yourself dry with a CLEAN TOWEL.  Wear CLEAN PAJAMAS to bed the night before surgery  Place CLEAN SHEETS on your bed the night before your surgery  DO NOT SLEEP WITH PETS.   Day of Surgery: Take a shower with CHG soap. Do not wear jewelry or makeup Do not wear lotions, powders, colognes, or deodorant. Men may shave face and neck. Do not bring valuables to the hospital. [facility] is not responsible for any belongings or valuables.  Wear Clean/Comfortable clothing the morning of surgery Remember to brush your teeth WITH YOUR REGULAR TOOTHPASTE.   Please read over the following fact sheets that you were given.    If you received a COVID test during your pre-op visit  it is requested that you wear a mask when out in public, stay away from anyone that may not be feeling well and notify your surgeon if you develop symptoms. If you have been in contact with anyone that has tested positive in the last 10 days please notify you surgeon.

## 2022-08-03 ENCOUNTER — Encounter (HOSPITAL_COMMUNITY): Payer: Self-pay

## 2022-08-03 ENCOUNTER — Ambulatory Visit: Payer: Medicaid Other | Admitting: Hematology

## 2022-08-03 ENCOUNTER — Other Ambulatory Visit: Payer: Self-pay

## 2022-08-03 ENCOUNTER — Encounter (HOSPITAL_COMMUNITY)
Admission: RE | Admit: 2022-08-03 | Discharge: 2022-08-03 | Disposition: A | Discharge status: Home / Self Care | Payer: Medicaid Other | Source: Ambulatory Visit | Attending: Neurosurgery | Admitting: Neurosurgery

## 2022-08-03 VITALS — BP 98/69 | HR 79 | Temp 97.6°F | Resp 18 | Ht 69.0 in | Wt 155.6 lb

## 2022-08-03 DIAGNOSIS — Z01812 Encounter for preprocedural laboratory examination: Secondary | ICD-10-CM | POA: Diagnosis present

## 2022-08-03 DIAGNOSIS — K769 Liver disease, unspecified: Secondary | ICD-10-CM

## 2022-08-03 DIAGNOSIS — C189 Malignant neoplasm of colon, unspecified: Secondary | ICD-10-CM | POA: Diagnosis not present

## 2022-08-03 DIAGNOSIS — C787 Secondary malignant neoplasm of liver and intrahepatic bile duct: Secondary | ICD-10-CM | POA: Insufficient documentation

## 2022-08-03 DIAGNOSIS — Z01818 Encounter for other preprocedural examination: Secondary | ICD-10-CM

## 2022-08-03 LAB — COMPREHENSIVE METABOLIC PANEL
ALT: 49 U/L — ABNORMAL HIGH (ref 0–44)
AST: 25 U/L (ref 15–41)
Albumin: 2.7 g/dL — ABNORMAL LOW (ref 3.5–5.0)
Alkaline Phosphatase: 133 U/L — ABNORMAL HIGH (ref 38–126)
Anion gap: 10 (ref 5–15)
BUN: 19 mg/dL (ref 6–20)
CO2: 22 mmol/L (ref 22–32)
Calcium: 8.5 mg/dL — ABNORMAL LOW (ref 8.9–10.3)
Chloride: 101 mmol/L (ref 98–111)
Creatinine, Ser: 0.77 mg/dL (ref 0.61–1.24)
GFR, Estimated: >60 mL/min (ref >60)
Glucose, Bld: 278 mg/dL — ABNORMAL HIGH (ref 70–99)
Potassium: 4.1 mmol/L (ref 3.5–5.1)
Sodium: 133 mmol/L — ABNORMAL LOW (ref 135–145)
Total Bilirubin: 1 mg/dL (ref 0.3–1.2)
Total Protein: 6.1 g/dL — ABNORMAL LOW (ref 6.5–8.1)

## 2022-08-03 LAB — CBC
HCT: 38.7 % — ABNORMAL LOW (ref 39.0–52.0)
Hemoglobin: 12.3 g/dL — ABNORMAL LOW (ref 13.0–17.0)
MCH: 28.3 pg (ref 26.0–34.0)
MCHC: 31.8 g/dL (ref 30.0–36.0)
MCV: 89 fL (ref 80.0–100.0)
Platelets: 270 10*3/uL (ref 150–400)
RBC: 4.35 MIL/uL (ref 4.22–5.81)
RDW: 20.9 % — ABNORMAL HIGH (ref 11.5–15.5)
WBC: 21 10*3/uL — ABNORMAL HIGH (ref 4.0–10.5)
nRBC: 0 % (ref 0.0–0.2)

## 2022-08-03 NOTE — Progress Notes (Addendum)
PCP - pt denies having PCP Cardiologist - pt denies having cardiologist  PPM/ICD - denies   Chest x-ray - N/A EKG - 10/08/21 Stress Test - denies ECHO - 12/06/20 Cardiac Cath - denies  Sleep Study - denies   Fasting Blood Sugar - N/A  Last dose of GLP1 agonist-  N/A  Blood Thinner Instructions: N/A Aspirin Instructions: N/A  ERAS Protcol - ERAS per order   COVID TEST- N/A   Anesthesia review: yes- recent hospitalization for AMS and AKI. Per Jeneen Rinks, repeat CBC and CMP today at PAT. WBC 21  Patient speaks conversational [place]. Pt states he can read in [place]. [place] interpreter on Ipad used for clarification during PAT appointment. Pt states he does not know his medications that he takes. Pt states that his medications should be correct from his last hospitalization. Pt given call number and instructed to call pharmacy when he gets home today to review medications. Patient verbalized understanding.   Patient denies shortness of breath, fever, cough and chest pain at PAT appointment. Pt denies any wounds/infection.    All instructions explained to the patient, with a verbal understanding of the material. Patient agrees to go over the instructions while at home for a better understanding. The opportunity to ask questions was provided.

## 2022-08-04 ENCOUNTER — Other Ambulatory Visit: Payer: Self-pay | Admitting: Radiation Therapy

## 2022-08-04 ENCOUNTER — Encounter: Payer: Self-pay | Admitting: Hematology

## 2022-08-04 NOTE — Progress Notes (Signed)
                                                                                                                                                             Patient Name: James Coffey MRN: [id number] DOB: 1970/10/26 Referring Physician: Florencia Reasons (Profile Not Attached) Date of Service: 07/31/2022 [facility], [place]                                                        End Of Treatment Note  Diagnoses: C20-Malignant neoplasm of rectum  Cancer Staging: STage IV adenocarcinoma of the rectum.  Intent: Palliative  Radiation Treatment Dates:  07/31/2022 through 07/31/2022 SRS Treatment Site Technique Total Dose (Gy) Dose per Fx (Gy) Completed Fx Beam Energies  Brain: Brain IMRT 15/15 15 1/1 6XFFF   Narrative: The patient tolerated radiation therapy relatively well. He will proceed with craniotomy with Dr. [name] on 08/05/22  Plan: The patient will receive a call in about one month from the radiation oncology department. He will continue follow up with Dr. Irene Limbo as well as Dr. Mickeal Skinner in the brain oncology program.   ________________________________________________    Carola Rhine, [facility]

## 2022-08-04 NOTE — Anesthesia Preprocedure Evaluation (Signed)
Anesthesia Evaluation  Patient identified by MRN, date of birth, ID band Patient awake    Reviewed: Allergy & Precautions, NPO status , Patient's Chart, lab work & pertinent test results  Airway Mallampati: III  TM Distance: >3 FB Neck ROM: Full    Dental  (+) Dental Advisory Given, Lower Dentures, Upper Dentures, Edentulous Lower   Pulmonary Current Smoker and Patient abstained from smoking.   Pulmonary exam normal breath sounds clear to auscultation       Cardiovascular negative cardio ROS  Rhythm:Regular Rate:Normal     Neuro/Psych negative neurological ROS     GI/Hepatic Liver mets s/p resection   parastomal hernia STAGE 4 recal cancer   Endo/Other  negative endocrine ROS    Renal/GU Renal disease     Musculoskeletal negative musculoskeletal ROS (+)    Abdominal   Peds  Hematology  (+) Blood dyscrasia, anemia   Anesthesia Other Findings   Reproductive/Obstetrics                             Anesthesia Physical Anesthesia Plan  ASA: 4  Anesthesia Plan: General   Post-op Pain Management: Ofirmev IV (intra-op)*   Induction: Intravenous  PONV Risk Score and Plan: 2 and Midazolam, Dexamethasone and Ondansetron  Airway Management Planned: Oral ETT  Additional Equipment: Arterial line  Intra-op Plan:   Post-operative Plan: Extubation in OR  Informed Consent: I have reviewed the patients History and Physical, chart, labs and discussed the procedure including the risks, benefits and alternatives for the proposed anesthesia with the patient or authorized representative who has indicated his/her understanding and acceptance.     Dental advisory given  Plan Discussed with: CRNA  Anesthesia Plan Comments: (2 x PIV vs CVL, Remi gtt  PAT note by Karoline Caldwell, PA-C: 52 year old male with medical history significant for oligometastatic rectal cancer status postchemotherapy, with  liver metastases s/p resection and thermal ablation, bilateral pulmonary metastases s/p SBRT, initially diagnosed with locally advanced rectal adenocarcinoma, iron deficiency anemia, recurrent perianal abscess(hospitalized in October for pelvic abscess status post abdominal perineal resection).  Recent admission 2/13 through 07/25/2022 for acute metabolic encephalopathy felt secondary to AKI and brain mets.  He had postobstructive AKI and a Foley was placed on 214.  Creatinine normalized with IV hydration and he was discharged for outpatient urology follow-up.  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  MRI 2/15 showed "findings compatible with a single 3.4 cm metastasis in the left frontal lobe".  He was seen by medical oncology, radiation oncology, and palliative care.  He was discharged home on Decadron and Keppra.  Preop labs reviewed, mild hyponatremia 133 (corrects to normal accounting for glucose 278), albumin low at 2.7, mild anemia with hemoglobin 12.3, elevated WBC 21.0 (likely due to high-dose steroids)  EKG 10/08/2021: NSR.  Rate 68.  TTE 12/06/2020: 1. Left ventricular ejection fraction, by estimation, is 55 to 60%. The  left ventricle has normal function. The left ventricle has no regional  wall motion abnormalities. Left ventricular diastolic parameters were  normal.  2. Right ventricular systolic function is normal. The right ventricular  size is normal.  3. The mitral valve is grossly normal. Trivial mitral valve  regurgitation.  4. The aortic valve is grossly normal. Aortic valve regurgitation is not  visualized.   )        Anesthesia Quick Evaluation

## 2022-08-04 NOTE — Progress Notes (Signed)
Anesthesia Chart Review:  52 year old male with medical history significant for oligometastatic rectal cancer status postchemotherapy, with liver metastases s/p resection and thermal ablation, bilateral pulmonary metastases s/p SBRT, initially diagnosed with locally advanced rectal adenocarcinoma, iron deficiency anemia, recurrent perianal abscess(hospitalized in October for pelvic abscess status post abdominal perineal resection).  Recent admission 2/13 through 07/25/2022 for acute metabolic encephalopathy felt secondary to AKI and brain mets.  He had postobstructive AKI and a Foley was placed on 214.  Creatinine normalized with IV hydration and he was discharged for outpatient urology follow-up.  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  MRI 2/15 showed "findings compatible with a single 3.4 cm metastasis in the left frontal lobe".  He was seen by medical oncology, radiation oncology, and palliative care.  He was discharged home on Decadron and Keppra.  Preop labs reviewed, mild hyponatremia 133 (corrects to normal accounting for glucose 278), albumin low at 2.7, mild anemia with hemoglobin 12.3, elevated WBC 21.0 (likely due to high-dose steroids)  EKG 10/08/2021: NSR.  Rate 68.  TTE 12/06/2020: 1. Left ventricular ejection fraction, by estimation, is 55 to 60%. The  left ventricle has normal function. The left ventricle has no regional  wall motion abnormalities. Left ventricular diastolic parameters were  normal.   2. Right ventricular systolic function is normal. The right ventricular  size is normal.   3. The mitral valve is grossly normal. Trivial mitral valve  regurgitation.   4. The aortic valve is grossly normal. Aortic valve regurgitation is not  visualized.       Wynonia Musty [facility]/Anesthesiology Phone [phone] 08/04/2022 10:34 AM

## 2022-08-05 ENCOUNTER — Inpatient Hospital Stay (HOSPITAL_COMMUNITY): Payer: Medicaid Other | Admitting: Physician Assistant

## 2022-08-05 ENCOUNTER — Inpatient Hospital Stay (HOSPITAL_COMMUNITY): Payer: Medicaid Other | Admitting: Registered Nurse

## 2022-08-05 ENCOUNTER — Inpatient Hospital Stay (HOSPITAL_COMMUNITY)
Admission: RE | Admit: 2022-08-05 | Discharge: 2022-08-07 | DRG: 026 | Disposition: A | Discharge status: Home / Self Care | Payer: Medicaid Other | Attending: Neurosurgery | Admitting: Neurosurgery

## 2022-08-05 ENCOUNTER — Encounter (HOSPITAL_COMMUNITY)
Admission: RE | Disposition: A | Discharge status: Home / Self Care | Payer: Self-pay | Source: Home / Self Care | Attending: Neurosurgery

## 2022-08-05 ENCOUNTER — Inpatient Hospital Stay (HOSPITAL_COMMUNITY): Payer: Medicaid Other

## 2022-08-05 ENCOUNTER — Encounter (HOSPITAL_COMMUNITY): Payer: Self-pay | Admitting: Neurosurgery

## 2022-08-05 ENCOUNTER — Other Ambulatory Visit: Payer: Self-pay

## 2022-08-05 DIAGNOSIS — Z933 Colostomy status: Secondary | ICD-10-CM | POA: Diagnosis not present

## 2022-08-05 DIAGNOSIS — Z9221 Personal history of antineoplastic chemotherapy: Secondary | ICD-10-CM | POA: Diagnosis not present

## 2022-08-05 DIAGNOSIS — Z885 Allergy status to narcotic agent status: Secondary | ICD-10-CM

## 2022-08-05 DIAGNOSIS — Z923 Personal history of irradiation: Secondary | ICD-10-CM | POA: Diagnosis not present

## 2022-08-05 DIAGNOSIS — Z9889 Other specified postprocedural states: Principal | ICD-10-CM

## 2022-08-05 DIAGNOSIS — G939 Disorder of brain, unspecified: Secondary | ICD-10-CM | POA: Diagnosis present

## 2022-08-05 DIAGNOSIS — N289 Disorder of kidney and ureter, unspecified: Secondary | ICD-10-CM | POA: Diagnosis not present

## 2022-08-05 DIAGNOSIS — G9389 Other specified disorders of brain: Secondary | ICD-10-CM

## 2022-08-05 DIAGNOSIS — C7931 Secondary malignant neoplasm of brain: Principal | ICD-10-CM | POA: Diagnosis present

## 2022-08-05 DIAGNOSIS — Z91014 Allergy to mammalian meats: Secondary | ICD-10-CM

## 2022-08-05 DIAGNOSIS — F17210 Nicotine dependence, cigarettes, uncomplicated: Secondary | ICD-10-CM | POA: Diagnosis not present

## 2022-08-05 DIAGNOSIS — C20 Malignant neoplasm of rectum: Secondary | ICD-10-CM

## 2022-08-05 DIAGNOSIS — C19 Malignant neoplasm of rectosigmoid junction: Secondary | ICD-10-CM | POA: Diagnosis present

## 2022-08-05 DIAGNOSIS — D649 Anemia, unspecified: Secondary | ICD-10-CM

## 2022-08-05 DIAGNOSIS — R4701 Aphasia: Secondary | ICD-10-CM | POA: Diagnosis present

## 2022-08-05 HISTORY — PX: APPLICATION OF CRANIAL NAVIGATION: SHX6578

## 2022-08-05 HISTORY — PX: CRANIOTOMY: SHX93

## 2022-08-05 LAB — POCT I-STAT 7, (LYTES, BLD GAS, ICA,H+H)
Acid-Base Excess: 2 mmol/L (ref 0.0–2.0)
Bicarbonate: 25.5 mmol/L (ref 20.0–28.0)
Calcium, Ion: 1.19 mmol/L (ref 1.15–1.40)
HCT: 34 % — ABNORMAL LOW (ref 39.0–52.0)
Hemoglobin: 11.6 g/dL — ABNORMAL LOW (ref 13.0–17.0)
O2 Saturation: 100 %
Patient temperature: 36.9
Potassium: 3.8 mmol/L (ref 3.5–5.1)
Sodium: 142 mmol/L (ref 135–145)
TCO2: 27 mmol/L (ref 22–32)
pCO2 arterial: 33.9 mmHg (ref 32–48)
pH, Arterial: 7.485 — ABNORMAL HIGH (ref 7.35–7.45)
pO2, Arterial: 297 mmHg — ABNORMAL HIGH (ref 83–108)

## 2022-08-05 LAB — MRSA NEXT GEN BY PCR, NASAL: MRSA by PCR Next Gen: DETECTED — AB

## 2022-08-05 SURGERY — CRANIOTOMY TUMOR EXCISION
Anesthesia: General

## 2022-08-05 MED ORDER — SODIUM CHLORIDE 0.9 % IV SOLN: INTRAVENOUS | Status: DC | PRN

## 2022-08-05 MED ORDER — DEXAMETHASONE SODIUM PHOSPHATE 10 MG/ML IJ SOLN
INTRAMUSCULAR | Status: DC | PRN
  Administered 2022-08-05: 10 mg via INTRAVENOUS

## 2022-08-05 MED ORDER — CHLORHEXIDINE GLUCONATE 0.12 % MT SOLN
15.0000 mL | Freq: Once | OROMUCOSAL | Status: AC
  Administered 2022-08-05: 15 mL via OROMUCOSAL

## 2022-08-05 MED ORDER — CHLORHEXIDINE GLUCONATE CLOTH 2 % EX PADS: 6.0000 | MEDICATED_PAD | Freq: Every day | CUTANEOUS | Status: DC

## 2022-08-05 MED ORDER — SODIUM CHLORIDE 0.9 % IV SOLN
0.1500 ug/kg/min | INTRAVENOUS | Status: DC
  Filled 2022-08-05: qty 2000

## 2022-08-05 MED ORDER — FENTANYL CITRATE (PF) 100 MCG/2ML IJ SOLN
25.0000 ug | INTRAMUSCULAR | Status: DC | PRN
  Administered 2022-08-05 (×2): 50 ug via INTRAVENOUS

## 2022-08-05 MED ORDER — ONDANSETRON HCL 4 MG/2ML IJ SOLN
INTRAMUSCULAR | Status: DC | PRN
  Administered 2022-08-05: 4 mg via INTRAVENOUS

## 2022-08-05 MED ORDER — PROPOFOL 10 MG/ML IV BOLUS
INTRAVENOUS | Status: AC
  Filled 2022-08-05: qty 20

## 2022-08-05 MED ORDER — PHENYLEPHRINE 80 MCG/ML (10ML) SYRINGE FOR IV PUSH (FOR BLOOD PRESSURE SUPPORT)
PREFILLED_SYRINGE | INTRAVENOUS | Status: AC
  Filled 2022-08-05: qty 10

## 2022-08-05 MED ORDER — HEMOSTATIC AGENTS (NO CHARGE) OPTIME
TOPICAL | Status: DC | PRN
  Administered 2022-08-05: 1 via TOPICAL

## 2022-08-05 MED ORDER — LABETALOL HCL 5 MG/ML IV SOLN: 10.0000 mg | INTRAVENOUS | Status: DC | PRN

## 2022-08-05 MED ORDER — MAGNESIUM CITRATE PO SOLN: 1.0000 | Freq: Once | ORAL | Status: DC | PRN

## 2022-08-05 MED ORDER — DEXAMETHASONE 4 MG PO TABS
4.0000 mg | ORAL_TABLET | Freq: Four times a day (QID) | ORAL | Status: AC
  Administered 2022-08-06 – 2022-08-07 (×4): 4 mg via ORAL
  Filled 2022-08-05 (×4): qty 1

## 2022-08-05 MED ORDER — LIDOCAINE-EPINEPHRINE 0.5 %-1:200000 IJ SOLN
INTRAMUSCULAR | Status: DC | PRN
  Administered 2022-08-05: 6 mL

## 2022-08-05 MED ORDER — LEVETIRACETAM 500 MG PO TABS: 500.0000 mg | ORAL_TABLET | Freq: Two times a day (BID) | ORAL | Status: DC

## 2022-08-05 MED ORDER — ORAL CARE MOUTH RINSE: 15.0000 mL | Freq: Once | OROMUCOSAL | Status: AC

## 2022-08-05 MED ORDER — ACETAMINOPHEN 650 MG RE SUPP: 650.0000 mg | RECTAL | Status: DC | PRN

## 2022-08-05 MED ORDER — PHENYLEPHRINE 80 MCG/ML (10ML) SYRINGE FOR IV PUSH (FOR BLOOD PRESSURE SUPPORT)
PREFILLED_SYRINGE | INTRAVENOUS | Status: DC | PRN
  Administered 2022-08-05 (×3): 80 ug via INTRAVENOUS

## 2022-08-05 MED ORDER — CHLORHEXIDINE GLUCONATE CLOTH 2 % EX PADS: 6.0000 | MEDICATED_PAD | Freq: Once | CUTANEOUS | Status: DC

## 2022-08-05 MED ORDER — MEPERIDINE HCL 25 MG/ML IJ SOLN: 6.2500 mg | INTRAMUSCULAR | Status: DC | PRN

## 2022-08-05 MED ORDER — ONDANSETRON HCL 4 MG PO TABS: 4.0000 mg | ORAL_TABLET | ORAL | Status: DC | PRN

## 2022-08-05 MED ORDER — ROCURONIUM BROMIDE 10 MG/ML (PF) SYRINGE
PREFILLED_SYRINGE | INTRAVENOUS | Status: AC
  Filled 2022-08-05: qty 10

## 2022-08-05 MED ORDER — THROMBIN 20000 UNITS EX SOLR
CUTANEOUS | Status: AC
  Filled 2022-08-05: qty 20000

## 2022-08-05 MED ORDER — AMISULPRIDE (ANTIEMETIC) 5 MG/2ML IV SOLN: 10.0000 mg | Freq: Once | INTRAVENOUS | Status: DC | PRN

## 2022-08-05 MED ORDER — MORPHINE SULFATE (PF) 2 MG/ML IV SOLN
1.0000 mg | INTRAVENOUS | Status: DC | PRN
  Administered 2022-08-06 (×3): 1 mg via INTRAVENOUS
  Filled 2022-08-05 (×3): qty 1

## 2022-08-05 MED ORDER — ROCURONIUM BROMIDE 10 MG/ML (PF) SYRINGE
PREFILLED_SYRINGE | INTRAVENOUS | Status: DC | PRN
  Administered 2022-08-05: 20 mg via INTRAVENOUS
  Administered 2022-08-05: 60 mg via INTRAVENOUS

## 2022-08-05 MED ORDER — ONDANSETRON HCL 4 MG/2ML IJ SOLN
INTRAMUSCULAR | Status: AC
  Filled 2022-08-05: qty 2

## 2022-08-05 MED ORDER — ONDANSETRON HCL 4 MG/2ML IJ SOLN: 4.0000 mg | INTRAMUSCULAR | Status: DC | PRN

## 2022-08-05 MED ORDER — LACTATED RINGERS IV SOLN: INTRAVENOUS | Status: DC

## 2022-08-05 MED ORDER — MUPIROCIN 2 % EX OINT
1.0000 | TOPICAL_OINTMENT | Freq: Two times a day (BID) | CUTANEOUS | Status: DC
  Administered 2022-08-05 – 2022-08-07 (×3): 1 via NASAL
  Filled 2022-08-05: qty 22

## 2022-08-05 MED ORDER — LIDOCAINE 2% (20 MG/ML) 5 ML SYRINGE
INTRAMUSCULAR | Status: DC | PRN
  Administered 2022-08-05: 70 mg via INTRAVENOUS

## 2022-08-05 MED ORDER — EPHEDRINE SULFATE-NACL 50-0.9 MG/10ML-% IV SOSY
PREFILLED_SYRINGE | INTRAVENOUS | Status: DC | PRN
  Administered 2022-08-05 (×3): 2.5 mg via INTRAVENOUS

## 2022-08-05 MED ORDER — DEXAMETHASONE 4 MG PO TABS: 4.0000 mg | ORAL_TABLET | Freq: Three times a day (TID) | ORAL | Status: DC

## 2022-08-05 MED ORDER — PANTOPRAZOLE SODIUM 40 MG PO TBEC
40.0000 mg | DELAYED_RELEASE_TABLET | Freq: Every day | ORAL | Status: DC
  Administered 2022-08-05 – 2022-08-07 (×3): 40 mg via ORAL
  Filled 2022-08-05 (×3): qty 1

## 2022-08-05 MED ORDER — PROPOFOL 10 MG/ML IV BOLUS
INTRAVENOUS | Status: DC | PRN
  Administered 2022-08-05: 25 ug/kg/min via INTRAVENOUS
  Administered 2022-08-05: 100 mg via INTRAVENOUS

## 2022-08-05 MED ORDER — VITAMIN B-12 1000 MCG PO TABS
1000.0000 ug | ORAL_TABLET | Freq: Every day | ORAL | Status: DC
  Administered 2022-08-05 – 2022-08-07 (×3): 1000 ug via ORAL
  Filled 2022-08-05 (×3): qty 1

## 2022-08-05 MED ORDER — PHENYLEPHRINE HCL-NACL 20-0.9 MG/250ML-% IV SOLN
INTRAVENOUS | Status: DC | PRN
  Administered 2022-08-05: 30 ug/min via INTRAVENOUS

## 2022-08-05 MED ORDER — DEXAMETHASONE SODIUM PHOSPHATE 10 MG/ML IJ SOLN
INTRAMUSCULAR | Status: AC
  Filled 2022-08-05: qty 1

## 2022-08-05 MED ORDER — LEVETIRACETAM IN NACL 500 MG/100ML IV SOLN
500.0000 mg | Freq: Two times a day (BID) | INTRAVENOUS | Status: DC
  Administered 2022-08-05 – 2022-08-06 (×2): 500 mg via INTRAVENOUS
  Filled 2022-08-05 (×2): qty 100

## 2022-08-05 MED ORDER — LORAZEPAM 0.5 MG PO TABS
0.5000 mg | ORAL_TABLET | Freq: Three times a day (TID) | ORAL | Status: DC | PRN
  Administered 2022-08-05: 0.5 mg via ORAL
  Filled 2022-08-05: qty 1

## 2022-08-05 MED ORDER — SODIUM CHLORIDE 0.9 % IR SOLN
Status: DC | PRN
  Administered 2022-08-05: 1000 mL

## 2022-08-05 MED ORDER — BACITRACIN ZINC 500 UNIT/GM EX OINT
TOPICAL_OINTMENT | CUTANEOUS | Status: DC | PRN
  Administered 2022-08-05: 1 via TOPICAL

## 2022-08-05 MED ORDER — DEXAMETHASONE 4 MG PO TABS
6.0000 mg | ORAL_TABLET | Freq: Four times a day (QID) | ORAL | Status: AC
  Administered 2022-08-05 – 2022-08-06 (×4): 6 mg via ORAL
  Filled 2022-08-05 (×4): qty 4

## 2022-08-05 MED ORDER — POTASSIUM CHLORIDE IN NACL 20-0.9 MEQ/L-% IV SOLN
INTRAVENOUS | Status: DC
  Filled 2022-08-05 (×2): qty 1000

## 2022-08-05 MED ORDER — POLYETHYLENE GLYCOL 3350 17 G PO PACK: 17.0000 g | PACK | Freq: Every day | ORAL | Status: DC | PRN

## 2022-08-05 MED ORDER — PROCHLORPERAZINE MALEATE 10 MG PO TABS: 10.0000 mg | ORAL_TABLET | Freq: Three times a day (TID) | ORAL | Status: DC | PRN

## 2022-08-05 MED ORDER — FENTANYL CITRATE (PF) 100 MCG/2ML IJ SOLN
INTRAMUSCULAR | Status: AC
  Filled 2022-08-05: qty 2

## 2022-08-05 MED ORDER — LEVETIRACETAM IN NACL 500 MG/100ML IV SOLN
500.0000 mg | INTRAVENOUS | Status: AC
  Administered 2022-08-05: 500 mg via INTRAVENOUS
  Filled 2022-08-05: qty 100

## 2022-08-05 MED ORDER — ORAL CARE MOUTH RINSE: 15.0000 mL | OROMUCOSAL | Status: DC | PRN

## 2022-08-05 MED ORDER — 0.9 % SODIUM CHLORIDE (POUR BTL) OPTIME
TOPICAL | Status: DC | PRN
  Administered 2022-08-05: 3000 mL

## 2022-08-05 MED ORDER — LIDOCAINE-EPINEPHRINE 0.5 %-1:200000 IJ SOLN
INTRAMUSCULAR | Status: AC
  Filled 2022-08-05: qty 50

## 2022-08-05 MED ORDER — THROMBIN 20000 UNITS EX SOLR: CUTANEOUS | Status: DC | PRN

## 2022-08-05 MED ORDER — NALOXONE HCL 0.4 MG/ML IJ SOLN: 0.0800 mg | INTRAMUSCULAR | Status: DC | PRN

## 2022-08-05 MED ORDER — FENTANYL CITRATE (PF) 250 MCG/5ML IJ SOLN
INTRAMUSCULAR | Status: AC
  Filled 2022-08-05: qty 5

## 2022-08-05 MED ORDER — LIDOCAINE 2% (20 MG/ML) 5 ML SYRINGE
INTRAMUSCULAR | Status: AC
  Filled 2022-08-05: qty 5

## 2022-08-05 MED ORDER — PROMETHAZINE HCL 25 MG/ML IJ SOLN: 6.2500 mg | INTRAMUSCULAR | Status: DC | PRN

## 2022-08-05 MED ORDER — SODIUM CHLORIDE 0.9 % IV SOLN: INTRAVENOUS | Status: DC

## 2022-08-05 MED ORDER — GADOBUTROL 1 MMOL/ML IV SOLN
7.0000 mL | Freq: Once | INTRAVENOUS | Status: AC | PRN
  Administered 2022-08-05: 7 mL via INTRAVENOUS

## 2022-08-05 MED ORDER — SUGAMMADEX SODIUM 200 MG/2ML IV SOLN
INTRAVENOUS | Status: DC | PRN
  Administered 2022-08-05: 200 mg via INTRAVENOUS

## 2022-08-05 MED ORDER — BACITRACIN ZINC 500 UNIT/GM EX OINT
TOPICAL_OINTMENT | CUTANEOUS | Status: AC
  Filled 2022-08-05: qty 28.35

## 2022-08-05 MED ORDER — FENTANYL CITRATE (PF) 250 MCG/5ML IJ SOLN
INTRAMUSCULAR | Status: DC | PRN
  Administered 2022-08-05: 100 ug via INTRAVENOUS
  Administered 2022-08-05: 50 ug via INTRAVENOUS

## 2022-08-05 MED ORDER — CEFAZOLIN SODIUM-DEXTROSE 2-4 GM/100ML-% IV SOLN
2.0000 g | INTRAVENOUS | Status: AC
  Administered 2022-08-05: 2 g via INTRAVENOUS

## 2022-08-05 MED ORDER — SODIUM CHLORIDE 0.9 % IV SOLN
INTRAVENOUS | Status: DC | PRN
  Administered 2022-08-05: .1 ug/kg/min via INTRAVENOUS

## 2022-08-05 MED ORDER — TAMSULOSIN HCL 0.4 MG PO CAPS
0.4000 mg | ORAL_CAPSULE | Freq: Every day | ORAL | Status: DC
  Administered 2022-08-05 – 2022-08-06 (×2): 0.4 mg via ORAL
  Filled 2022-08-05 (×2): qty 1

## 2022-08-05 MED ORDER — ACETAMINOPHEN 325 MG PO TABS
650.0000 mg | ORAL_TABLET | ORAL | Status: DC | PRN
  Administered 2022-08-05 – 2022-08-07 (×4): 650 mg via ORAL
  Filled 2022-08-05 (×4): qty 2

## 2022-08-05 SURGICAL SUPPLY — 92 items
APL SKNCLS STERI-STRIP NONHPOA (GAUZE/BANDAGES/DRESSINGS)
BAG COUNTER SPONGE SURGICOUNT (BAG) ×2 IMPLANT
BAG SPNG CNTER NS LX DISP (BAG) ×2
BAND INSRT 18 STRL LF DISP RB (MISCELLANEOUS)
BAND RUBBER #18 3X1/16 STRL (MISCELLANEOUS) IMPLANT
BENZOIN TINCTURE PRP APPL 2/3 (GAUZE/BANDAGES/DRESSINGS) IMPLANT
BLADE CLIPPER SURG (BLADE) ×2 IMPLANT
BLADE SAW GIGLI 16 STRL (MISCELLANEOUS) IMPLANT
BLADE SURG 15 STRL LF DISP TIS (BLADE) IMPLANT
BLADE SURG 15 STRL SS (BLADE)
BLADE ULTRA TIP 2M (BLADE) IMPLANT
BNDG GAUZE DERMACEA FLUFF 4 (GAUZE/BANDAGES/DRESSINGS) IMPLANT
BNDG GZE DERMACEA 4 6PLY (GAUZE/BANDAGES/DRESSINGS)
BUR ACORN 6.0 PRECISION (BURR) ×2 IMPLANT
BUR MATCHSTICK NEURO 3.0 LAGG (BURR) IMPLANT
BUR SPIRAL ROUTER 2.3 (BUR) IMPLANT
CANISTER SUCT 3000ML PPV (MISCELLANEOUS) ×2 IMPLANT
CASSETTE SUCT IRRIG SONOPET IQ (MISCELLANEOUS) IMPLANT
CATH VENTRIC 35X38 W/TROCAR LG (CATHETERS) IMPLANT
CLIP VESOCCLUDE MED 6/CT (CLIP) IMPLANT
CNTNR URN SCR LID CUP LEK RST (MISCELLANEOUS) ×2 IMPLANT
CONT SPEC 4OZ STRL OR WHT (MISCELLANEOUS) ×2
COVER BURR HOLE UNIV 10 (Orthopedic Implant) IMPLANT
COVERAGE SUPPORT O-ARM STEALTH (MISCELLANEOUS) ×2 IMPLANT
DRAIN SUBARACHNOID (WOUND CARE) IMPLANT
DRAPE CAMERA VIDEO/LASER (DRAPES) IMPLANT
DRAPE MICROSCOPE SLANT 54X150 (MISCELLANEOUS) IMPLANT
DRAPE NEUROLOGICAL W/INCISE (DRAPES) ×2 IMPLANT
DRAPE ORTHO SPLIT 77X108 STRL (DRAPES)
DRAPE SURG 17X23 STRL (DRAPES) IMPLANT
DRAPE SURG ORHT 6 SPLT 77X108 (DRAPES) IMPLANT
DRAPE WARM FLUID 44X44 (DRAPES) ×2 IMPLANT
DRSG TELFA 3X8 NADH STRL (GAUZE/BANDAGES/DRESSINGS) IMPLANT
DURAPREP 6ML APPLICATOR 50/CS (WOUND CARE) ×2 IMPLANT
ELECT REM PT RETURN 9FT ADLT (ELECTROSURGICAL) ×2
ELECTRODE REM PT RTRN 9FT ADLT (ELECTROSURGICAL) ×2 IMPLANT
EVACUATOR 1/8 PVC DRAIN (DRAIN) IMPLANT
EVACUATOR SILICONE 100CC (DRAIN) IMPLANT
FEE COVERAGE SUPPORT O-ARM (MISCELLANEOUS) ×2 IMPLANT
FORCEPS BIPOLAR SPETZLER 8 1.0 (NEUROSURGERY SUPPLIES) IMPLANT
GAUZE 4X4 16PLY ~~LOC~~+RFID DBL (SPONGE) IMPLANT
GAUZE SPONGE 4X4 12PLY STRL (GAUZE/BANDAGES/DRESSINGS) ×2 IMPLANT
GLOVE ECLIPSE 6.5 STRL STRAW (GLOVE) ×4 IMPLANT
GLOVE EXAM NITRILE XL STR (GLOVE) IMPLANT
GOWN STRL REUS W/ TWL LRG LVL3 (GOWN DISPOSABLE) ×4 IMPLANT
GOWN STRL REUS W/ TWL XL LVL3 (GOWN DISPOSABLE) IMPLANT
GOWN STRL REUS W/TWL 2XL LVL3 (GOWN DISPOSABLE) IMPLANT
GOWN STRL REUS W/TWL LRG LVL3 (GOWN DISPOSABLE) ×4
GOWN STRL REUS W/TWL XL LVL3 (GOWN DISPOSABLE)
GRAFT DURAGEN MATRIX 3WX3L (Graft) ×2 IMPLANT
GRAFT DURAGEN MATRIX 3X3 SNGL (Graft) IMPLANT
HEMOSTAT SURGICEL 2X14 (HEMOSTASIS) IMPLANT
HOOK DURA 1/2IN (MISCELLANEOUS) ×2 IMPLANT
KIT BASIN OR (CUSTOM PROCEDURE TRAY) ×2 IMPLANT
KIT DRAIN CSF ACCUDRAIN (MISCELLANEOUS) IMPLANT
KIT TURNOVER KIT B (KITS) ×2 IMPLANT
MARKER SPHERE PSV REFLC 13MM (MARKER) ×6 IMPLANT
NDL HYPO 25X1 1.5 SAFETY (NEEDLE) ×2 IMPLANT
NDL SPNL 18GX3.5 QUINCKE PK (NEEDLE) IMPLANT
NEEDLE HYPO 25X1 1.5 SAFETY (NEEDLE) ×2 IMPLANT
NEEDLE SPNL 18GX3.5 QUINCKE PK (NEEDLE) IMPLANT
NS IRRIG 1000ML POUR BTL (IV SOLUTION) ×6 IMPLANT
PACK BATTERY CMF DISP FOR DVR (ORTHOPEDIC DISPOSABLE SUPPLIES) IMPLANT
PACK CRANIOTOMY CUSTOM (CUSTOM PROCEDURE TRAY) ×2 IMPLANT
PATTIES SURGICAL .25X.25 (GAUZE/BANDAGES/DRESSINGS) IMPLANT
PATTIES SURGICAL .5 X.5 (GAUZE/BANDAGES/DRESSINGS) IMPLANT
PATTIES SURGICAL .5 X1 (DISPOSABLE) IMPLANT
PATTIES SURGICAL .5 X3 (DISPOSABLE) IMPLANT
PATTIES SURGICAL 1/4 X 3 (GAUZE/BANDAGES/DRESSINGS) IMPLANT
PATTIES SURGICAL 1X1 (DISPOSABLE) IMPLANT
SCREW UNIII AXS SD 1.5X4 (Screw) IMPLANT
SPECIMEN JAR SMALL (MISCELLANEOUS) IMPLANT
SPIKE FLUID TRANSFER (MISCELLANEOUS) ×2 IMPLANT
SPONGE NEURO XRAY DETECT 1X3 (DISPOSABLE) IMPLANT
SPONGE SURGIFOAM ABS GEL 100 (HEMOSTASIS) ×2 IMPLANT
STAPLER SKIN PROX WIDE 3.9 (STAPLE) IMPLANT
STAPLER VISISTAT 35W (STAPLE) ×2 IMPLANT
SUT ETHILON 3 0 FSL (SUTURE) IMPLANT
SUT ETHILON 3 0 PS 1 (SUTURE) IMPLANT
SUT NURALON 4 0 TR CR/8 (SUTURE) ×6 IMPLANT
SUT SILK 0 TIES 10X30 (SUTURE) IMPLANT
SUT STEEL 0 (SUTURE)
SUT STEEL 0 18XMFL TIE 17 (SUTURE) IMPLANT
SUT VIC AB 2-0 CT2 18 VCP726D (SUTURE) ×4 IMPLANT
TIP TISSUE SONOPET IQ STD 12 (TIP) IMPLANT
TOWEL GREEN STERILE (TOWEL DISPOSABLE) ×2 IMPLANT
TOWEL GREEN STERILE FF (TOWEL DISPOSABLE) ×2 IMPLANT
TRAY FOLEY MTR SLVR 16FR STAT (SET/KITS/TRAYS/PACK) ×2 IMPLANT
TUBE CONNECTING 12X1/4 (SUCTIONS) ×2 IMPLANT
TUBING FEATHERFLOW (TUBING) IMPLANT
UNDERPAD 30X36 HEAVY ABSORB (UNDERPADS AND DIAPERS) ×2 IMPLANT
WATER STERILE IRR 1000ML POUR (IV SOLUTION) ×2 IMPLANT

## 2022-08-05 NOTE — Op Note (Signed)
08/05/2022  4:50 PM  PATIENT:  James Coffey  52 y.o. male  PRE-OPERATIVE DIAGNOSIS:  Brain mass Left frontal, known colorectal CA POST-OPERATIVE DIAGNOSIS:  Brain mass Left frontal, known colorectal CA PROCEDURE:  Procedure(s): Left frontal craniotomy for resection of tumor with Stealth APPLICATION OF CRANIAL NAVIGATION  SURGEON: Surgeon(s): [name], MD  ASSISTANTS:none  ANESTHESIA:   general  EBL:  Total I/O In: 1700 [I.V.:1500; IV Piggyback:200] Out: 1525 [Urine:1500; Blood:25]  BLOOD ADMINISTERED:none  CELL SAVER GIVEN:not used  COUNT:per nursing  DRAINS: none   SPECIMEN:  Source of Specimen:  left frontal lobe  DICTATION: [name] was taken to the operating room, intubated, and placed under an anesthetic without difficulty. His head was placed in the [place] head holder once adequate anesthesia was obtained. His head was attached to the bed with an adapter. I shaved his head and started the registration to the navigation system. HIs head was registered to the Stealth system.  His head  was prepped and draped in a sterile manner.  I opened the scalp with a 10 blade and made a curvilinear incision in the coronal plane. With the preoperative plan from the stereotactic system I confirmed the location of the incision and subsequent craniotomy. I placed Raney clips on the scalp edges, then a cerebellar retractor to expose the skull. I drilled burr holes, and turned the craniotomy flap centered on the tumor. I removed the bone flap. I opened the dura and the tumor was easily seen.  I resected the tumor in the following manner. I dissected between the capsule and the brain. I then opened the capsule and with the sonopet removed a good deal of tumor causing the capsule to be folded inwards. I went around the capsule until I was able to dissect around it, and deliver it from the brain. I used suction and bipolar cautery to remove the tumor. The specimen was sent to  Pathology.  I achieved hemostasis. I approximated the dura. I approximated the skull flap with plates and screws. I approximated the scalp with subgaleal sutures, then staples for the scalp edges. I placed a sterile dressing . He was extubated and taken to the PACU moving all extremities.  PLAN OF CARE: Admit to inpatient   PATIENT DISPOSITION:  PACU - hemodynamically stable.   Delay start of Pharmacological VTE agent (>24hrs) due to surgical blood loss or risk of bleeding:  yes

## 2022-08-05 NOTE — H&P (Signed)
  James Coffey has a history of colon CA and presented with recent history of word-finding difficulties, difficulty with speech, essentially a global aphasia.  This led to an MRI of the brain, which led to the finding of a metastatic lesion in the left frontal lobe.  He underwent a 3 Tesla study, and he only has one solo lesion.  He is now a candidate for radiosurgery upfront and then subsequent surgical resection. Mr. Saunder is 52 years of age.  He has a history of stage IV oligometastatic rectal cancer with previous metastases to the liver and lungs.  He had undergone a left hepatectomy in March 2023.  He had a percutaneous ablation of a right liver tumor in May 2023, chemotherapy, and previous radiation treatment.  He has had radiation treatment to the rectum.  He had a robotic abdominoperineal resection and parastomal hernia repair in August 2023.  He has dealt with an abscess in the peritoneal cavity and had surgical extirpation of the abscess and placement of a drain.  He has a history of an ectasia of the aorta and tobacco abuse.  He has undergone a herniorrhaphy, incision and drainage of the abscess on 3 different occasions.  He also had an I and D of a perirectal abscess and inguinal herniorrhaphy.  He has had port insertions.  He has had a colostomy, laparoscopy, hepatectomy, rectal surgery.  History in the family of coronary artery disease and inflammatory bowel disease.  He takes Acetaminophen, Oxycodone, Compazine, Zolpidem.  He has had palonosetron.     On examination, he is alert, oriented x4.  He answers all questions appropriately.  He does speak [place], and his daughter works here as a Optometrist, but he is responsive, following all commands.  Strength is normal in the upper and lower extremities.  There is no drift.  Pupils equal, round, and reactive to light.  Full extraocular movements. Full visual fields. Hearing intact to voice.  Uvula elevates in the midline.  Shoulder shrug is normal.   Tongue protrudes in the midline.  He has a negative Romberg test.  Normal gait.  He can tandem walk with ease.  Reflexes 2+ biceps, triceps, brachioradialis, knees, and ankles.     MRI shows a left frontal mass, spherical in its shape.     The plan for Mr. Coultas is that he undergo radiosurgery tomorrow and that he undergo resection of the mass next week, Wednesday.  Risks and benefits, bleeding, stroke, coma, death, infection, were discussed. Brain damage was discussed.  I expect him to be a little bit worse immediately after surgery, but that should improve later.  His daughter stated that his speech improved significantly once he started on Decadron.  He is also on Keppra.  I will see him this coming Wednesday.

## 2022-08-05 NOTE — Transfer of Care (Signed)
Immediate Anesthesia Transfer of Care Note  Patient: James Coffey  Procedure(s) Performed: Left frontal craniotomy for resection of tumor with Stealth (Left) APPLICATION OF CRANIAL NAVIGATION  Patient Location: PACU  Anesthesia Type:General  Level of Consciousness: awake, alert , and patient cooperative  Airway & Oxygen Therapy: Patient connected to face mask oxygen  Post-op Assessment: Report given to RN and Post -op Vital signs reviewed and stable  Post vital signs: Reviewed and stable  Last Vitals:  Vitals Value Taken Time  BP 119/77 08/05/22 1545  Temp    Pulse 82 08/05/22 1547  Resp 12 08/05/22 1548  SpO2 100 % 08/05/22 1547  Vitals shown include unvalidated device data.  Last Pain:  Vitals:   08/05/22 1050  TempSrc:   PainSc: 0-No pain         Complications: There were no known notable events for this encounter.

## 2022-08-05 NOTE — Anesthesia Procedure Notes (Signed)
Arterial Line Insertion Start/End2/28/2024 11:45 AM, 08/05/2022 11:50 AM Performed by: Colin Benton, CRNA, CRNA  Preanesthetic checklist: patient identified, IV checked, site marked, risks and benefits discussed, surgical consent, monitors and equipment checked, pre-op evaluation, timeout performed and anesthesia consent Lidocaine 1% used for infiltration Right, radial was placed Catheter size: 20 G Hand hygiene performed , maximum sterile barriers used  and Seldinger technique used Allen's test indicative of satisfactory collateral circulation Attempts: 1 Procedure performed without using ultrasound guided technique. Following insertion, dressing applied and Biopatch. Post procedure assessment: normal and unchanged  Patient tolerated the procedure well with no immediate complications.

## 2022-08-05 NOTE — Anesthesia Procedure Notes (Signed)
Procedure Name: Intubation Date/Time: 08/05/2022 12:56 PM  Performed by: Ester Rink, CRNAPre-anesthesia Checklist: Patient identified, Emergency Drugs available, Suction available and Patient being monitored Patient Re-evaluated:Patient Re-evaluated prior to induction Oxygen Delivery Method: Circle system utilized Preoxygenation: Pre-oxygenation with 100% oxygen Induction Type: IV induction Ventilation: Mask ventilation without difficulty Laryngoscope Size: Mac and 4 Grade View: Grade I Tube type: Oral Tube size: 7.5 mm Number of attempts: 1 Airway Equipment and Method: Stylet and Oral airway Placement Confirmation: ETT inserted through vocal cords under direct vision, positive ETCO2 and breath sounds checked- equal and bilateral Secured at: 22 cm Tube secured with: Tape Dental Injury: Teeth and Oropharynx as per pre-operative assessment

## 2022-08-06 ENCOUNTER — Encounter (HOSPITAL_COMMUNITY): Payer: Self-pay | Admitting: Neurosurgery

## 2022-08-06 LAB — SURGICAL PATHOLOGY

## 2022-08-06 MED ORDER — LEVETIRACETAM 500 MG PO TABS
500.0000 mg | ORAL_TABLET | Freq: Two times a day (BID) | ORAL | Status: DC
  Administered 2022-08-06 – 2022-08-07 (×2): 500 mg via ORAL
  Filled 2022-08-06 (×2): qty 1

## 2022-08-06 NOTE — Progress Notes (Signed)
Patient ID: James Coffey, male   DOB: 1970-08-14, 52 y.o.   MRN: [id number] BP 103/64 (BP Location: Left Arm)   Pulse (!) 59   Temp 98.2 F (36.8 C)   Resp 11   Ht '5\' 9"'$  (1.753 m)   Wt 77.1 kg   SpO2 98%   BMI 25.10 kg/m  Alert, oriented x 4 Moving all extremities well MRI looks good Possible discharge today Wound is clean, dry, no signs of infection

## 2022-08-06 NOTE — Anesthesia Postprocedure Evaluation (Signed)
Anesthesia Post Note  Patient: Audiological scientist  Procedure(s) Performed: Left frontal craniotomy for resection of tumor with Stealth (Left) [place]     Patient location during evaluation: PACU Anesthesia Type: General Level of consciousness: sedated and patient cooperative Pain management: pain level controlled Vital Signs Assessment: post-procedure vital signs reviewed and stable Respiratory status: spontaneous breathing Cardiovascular status: stable Anesthetic complications: no   There were no known notable events for this encounter.  Last Vitals:  Vitals:   08/06/22 0900 08/06/22 1000  BP: 95/67 93/67  Pulse: (!) 54 (!) 59  Resp: 14 13  Temp:    SpO2: 98% 97%    Last Pain:  Vitals:   08/06/22 0400  TempSrc:   PainSc: [place]

## 2022-08-07 ENCOUNTER — Other Ambulatory Visit (HOSPITAL_COMMUNITY): Payer: Self-pay

## 2022-08-07 MED ORDER — LEVETIRACETAM 500 MG PO TABS: 500.0000 mg | ORAL_TABLET | Freq: Two times a day (BID) | ORAL | 0 refills | Status: DC

## 2022-08-07 MED ORDER — ACETAMINOPHEN-CODEINE 300-30 MG PO TABS
1.0000 | ORAL_TABLET | Freq: Four times a day (QID) | ORAL | 0 refills | Status: DC | PRN
  Filled 2022-08-07: qty 28, 7d supply, fill #0

## 2022-08-07 MED ORDER — DEXAMETHASONE 4 MG PO TABS: 4.0000 mg | ORAL_TABLET | Freq: Two times a day (BID) | ORAL | 0 refills | Status: DC

## 2022-08-07 NOTE — Plan of Care (Signed)
Discharge instruction review with patient and patient verbalized understanding using teach back

## 2022-08-07 NOTE — Progress Notes (Signed)
Paged MD at [phone] Twice today and no return phone call. Patient is requesting to go home.

## 2022-08-07 NOTE — Discharge Instructions (Signed)
Craniotomy °Care After °Please read the instructions outlined below and refer to this sheet in the next few weeks. These discharge instructions provide you with general information on caring for yourself after you leave the hospital. Your surgeon may also give you specific instructions. While your treatment has been planned according to the most current medical practices available, unavoidable complications occasionally occur. If you have any problems or questions after discharge, please call your surgeon. °Although there are many types of brain surgery, recovery following craniotomy (surgical opening of the skull) is much the same for each. However, recovery depends on many factors. These include the type and severity of brain injury and the type of surgery. It also depends on any nervous system function problems (neurological deficits) before surgery. If the craniotomy was done for cancer, chemotherapy and radiation could follow. You could be in the hospital from 5 days to a couple weeks. This depends on the type of surgery, findings, and whether there are complications. °HOME CARE INSTRUCTIONS  °· It is not unusual to hear a clicking noise after a craniotomy, the plates and screws used to attach the bone flap can sometimes cause this. It is a normal occurrence if this does happen °· Do not drive for 10 days after the operation °· Your scalp may feel spongy for a while, because of fluid under it. This will gradually get better. Occasionally, the surgeon will not replace the bone that was removed to access the brain. If there is a bony defect, the surgeon will ask you to wear a helmet for protection. This is a discussion you should have with your surgeon prior to leaving the hospital (discharge). °· Numbness may persist in some areas of your scalp. °· Take all medications as directed. Sometimes steroids to control swelling are prescribed. Anticonvulsants to prevent seizures may also be given. Do not use alcohol,  other drugs, or medications unless your surgeon says it is OK. °· Keep the wound dry and clean. The wound may be washed gently with soap and water. Then, you may gently blot or dab it dry, without rubbing. Do not take baths, use swimming pools or hot tubs for 10 days, or as instructed by your caregiver. It is best to wait to see you surgeon at your first postoperative visit, and to get directions at that time. °· Only take over-the-counter or prescription medicines for pain, discomfort, or fever as directed by your caregiver. °· You may continue your normal diet, as directed. °· Walking is OK for exercise. Wait at least 3 months before you return to mild, non-contact sports or as your surgeon suggests. Contact sports should be avoided for at least 1 year, unless your surgeon says it is OK. °· If you are prescribed steroids, take them exactly as prescribed. If you start having a decrease in nervous system functions (neurological deficits) and headaches as the dose of steroids is reduced, tell your surgeon right away. °· When the anticonvulsant prescription is finished you no longer need to take it. °SEEK IMMEDIATE MEDICAL CARE IF:  °· You develop nausea, vomiting, severe headaches, confusion, or you have a seizure. °· You develop chest pain, a stiff neck, or difficulty breathing. °· There is redness, swelling, or increasing pain in the wound or pin insertion sites. °· You have an increase in swelling or bruising around the eyes. °· There is drainage or pus coming from the wound. °· You have an oral temperature above 102° F (38.9° C), not controlled by medicine. °·   You notice a foul smell coming from the wound or dressing. °· The wound breaks open (edges not staying together) after the stitches have been removed. °· You develop dizziness or fainting while standing. °· You develop a rash. °· You develop any reaction or side effects to the medications given. °Document Released: 08/25/2005 Document Revised: 08/17/2011  Document Reviewed: 06/03/2009 °ExitCare® Patient Information ©2013 [employer]. ° °

## 2022-08-07 NOTE — Discharge Summary (Signed)
Physician Discharge Summary  Patient ID: Sufiyan Mcgaffigan MRN: [id number] DOB/AGE: Mar 18, 1971 52 y.o.  Admit date: 08/05/2022 Discharge date: 08/07/2022  Admission Diagnoses:Metastatic colorectal CA left frontal lobe  Discharge Diagnoses:  Principal Problem:   Status post craniotomy Active Problems:   Colorectal carcinoma [facility])   Discharged Condition: good  Hospital Course: Mr. Matuszek was admitted, and taken to the operating room for an uncomplicated craniotomy and tumor resection. Post op he has moved all extremities well, his wound is clean, dry, and without signs of infection. He is voiding, ambulating, and tolerating a regular diet.   Treatments: surgery: left frontal craniotomy for tumor resection  Discharge Exam: Blood pressure (!) 82/54, pulse 72, temperature 98.1 F (36.7 C), temperature source Axillary, resp. rate 18, height '5\' 9"'$  (1.753 m), weight 77.1 kg, SpO2 99 %. General appearance: alert, cooperative, appears stated age, and no distress  Disposition: Discharge disposition: 01-Home or Self Care      Brain mass  Allergies as of 08/07/2022       Reactions   Hydrocodone Itching   Pork-derived Products Other (See Comments)   Do not eat pork        Medication List     TAKE these medications    acetaminophen 500 MG tablet Commonly known as: TYLENOL Take 2 tablets (1,000 mg total) by mouth every 8 (eight) hours as needed for mild pain or fever.   acetaminophen-codeine 300-30 MG tablet Commonly known as: TYLENOL #3 Take 1 tablet by mouth every 6 (six) hours as needed for up to 7 days for moderate pain.   cyanocobalamin 1000 MCG tablet Commonly known as: VITAMIN B12 Take 1 tablet (1,000 mcg total) by mouth daily.   dexamethasone 4 MG tablet Commonly known as: DECADRON Take 1 tablet (4 mg total) by mouth 2 (two) times daily for 7 days. What changed: when to take this   levETIRAcetam 500 MG tablet Commonly known as: KEPPRA Take 1 tablet (500 mg total) by  mouth 2 (two) times daily.   LORazepam 0.5 MG tablet Commonly known as: ATIVAN Take 1 tablet (0.5 mg total) by mouth at bedtime. What changed:  when to take this reasons to take this   oxyCODONE 5 MG immediate release tablet Commonly known as: Oxy IR/ROXICODONE Take 1 tablet (5 mg total) by mouth every 6 (six) hours as needed for moderate pain or severe pain. What changed: when to take this   pantoprazole 40 MG tablet Commonly known as: Protonix Take 1 tablet (40 mg total) by mouth daily.   polyethylene glycol 17 g packet Commonly known as: MiraLax Take 17 g by mouth daily. Take daily to maintain regular soft colostomy output. Can increase to twice a day if need. What changed:  when to take this reasons to take this additional instructions   prochlorperazine 10 MG tablet Commonly known as: COMPAZINE Take 1 tablet (10 mg total) by mouth every 6 (six) hours as needed for nausea or vomiting. What changed: when to take this   senna-docusate 8.6-50 MG tablet Commonly known as: Senna S Take 2 tablets by mouth at bedtime.   tamsulosin 0.4 MG Caps capsule Commonly known as: Flomax Take 1 capsule (0.4 mg total) by mouth daily after supper.        Follow-up Information     [name], MD Follow up.   Specialty: Neurosurgery Why: call to have your staples removed in one week Contact information: [address] [place] [address] [place] [address] [phone]  SignedAshok Pall 08/07/2022, 2:24 PM

## 2022-08-10 ENCOUNTER — Emergency Department (HOSPITAL_COMMUNITY): Payer: Medicaid Other

## 2022-08-10 ENCOUNTER — Inpatient Hospital Stay: Payer: Medicaid Other | Attending: Hematology

## 2022-08-10 ENCOUNTER — Encounter (HOSPITAL_COMMUNITY): Payer: Self-pay

## 2022-08-10 ENCOUNTER — Other Ambulatory Visit: Payer: Self-pay

## 2022-08-10 ENCOUNTER — Inpatient Hospital Stay (HOSPITAL_COMMUNITY)
Admission: EM | Admit: 2022-08-10 | Discharge: 2022-08-19 | DRG: 871 | Disposition: A | Discharge status: Home Health | Payer: Medicaid Other | Attending: Internal Medicine | Admitting: Internal Medicine

## 2022-08-10 DIAGNOSIS — N3080 Other cystitis without hematuria: Secondary | ICD-10-CM | POA: Insufficient documentation

## 2022-08-10 DIAGNOSIS — I4891 Unspecified atrial fibrillation: Secondary | ICD-10-CM | POA: Diagnosis present

## 2022-08-10 DIAGNOSIS — C7801 Secondary malignant neoplasm of right lung: Secondary | ICD-10-CM | POA: Diagnosis present

## 2022-08-10 DIAGNOSIS — E871 Hypo-osmolality and hyponatremia: Secondary | ICD-10-CM | POA: Diagnosis present

## 2022-08-10 DIAGNOSIS — C7802 Secondary malignant neoplasm of left lung: Secondary | ICD-10-CM | POA: Diagnosis present

## 2022-08-10 DIAGNOSIS — Z1152 Encounter for screening for COVID-19: Secondary | ICD-10-CM | POA: Diagnosis not present

## 2022-08-10 DIAGNOSIS — N12 Tubulo-interstitial nephritis, not specified as acute or chronic: Secondary | ICD-10-CM

## 2022-08-10 DIAGNOSIS — J9601 Acute respiratory failure with hypoxia: Secondary | ICD-10-CM | POA: Diagnosis present

## 2022-08-10 DIAGNOSIS — J189 Pneumonia, unspecified organism: Secondary | ICD-10-CM | POA: Diagnosis present

## 2022-08-10 DIAGNOSIS — G9341 Metabolic encephalopathy: Secondary | ICD-10-CM | POA: Diagnosis not present

## 2022-08-10 DIAGNOSIS — N179 Acute kidney failure, unspecified: Secondary | ICD-10-CM | POA: Diagnosis present

## 2022-08-10 DIAGNOSIS — K611 Rectal abscess: Secondary | ICD-10-CM | POA: Diagnosis present

## 2022-08-10 DIAGNOSIS — A419 Sepsis, unspecified organism: Secondary | ICD-10-CM | POA: Diagnosis present

## 2022-08-10 DIAGNOSIS — D6959 Other secondary thrombocytopenia: Secondary | ICD-10-CM | POA: Diagnosis present

## 2022-08-10 DIAGNOSIS — G9389 Other specified disorders of brain: Secondary | ICD-10-CM

## 2022-08-10 DIAGNOSIS — E872 Acidosis, unspecified: Secondary | ICD-10-CM | POA: Diagnosis present

## 2022-08-10 DIAGNOSIS — F17210 Nicotine dependence, cigarettes, uncomplicated: Secondary | ICD-10-CM | POA: Diagnosis present

## 2022-08-10 DIAGNOSIS — K7200 Acute and subacute hepatic failure without coma: Secondary | ICD-10-CM | POA: Diagnosis present

## 2022-08-10 DIAGNOSIS — N390 Urinary tract infection, site not specified: Secondary | ICD-10-CM | POA: Diagnosis not present

## 2022-08-10 DIAGNOSIS — R319 Hematuria, unspecified: Secondary | ICD-10-CM | POA: Diagnosis present

## 2022-08-10 DIAGNOSIS — A4151 Sepsis due to Escherichia coli [E. coli]: Secondary | ICD-10-CM | POA: Diagnosis present

## 2022-08-10 DIAGNOSIS — R638 Other symptoms and signs concerning food and fluid intake: Secondary | ICD-10-CM

## 2022-08-10 DIAGNOSIS — Z7189 Other specified counseling: Secondary | ICD-10-CM | POA: Diagnosis not present

## 2022-08-10 DIAGNOSIS — Z789 Other specified health status: Secondary | ICD-10-CM | POA: Diagnosis not present

## 2022-08-10 DIAGNOSIS — Z9049 Acquired absence of other specified parts of digestive tract: Secondary | ICD-10-CM

## 2022-08-10 DIAGNOSIS — K435 Parastomal hernia without obstruction or  gangrene: Secondary | ICD-10-CM | POA: Diagnosis present

## 2022-08-10 DIAGNOSIS — D6489 Other specified anemias: Secondary | ICD-10-CM | POA: Diagnosis present

## 2022-08-10 DIAGNOSIS — Z711 Person with feared health complaint in whom no diagnosis is made: Secondary | ICD-10-CM

## 2022-08-10 DIAGNOSIS — G894 Chronic pain syndrome: Secondary | ICD-10-CM | POA: Diagnosis present

## 2022-08-10 DIAGNOSIS — D696 Thrombocytopenia, unspecified: Secondary | ICD-10-CM | POA: Diagnosis not present

## 2022-08-10 DIAGNOSIS — C7931 Secondary malignant neoplasm of brain: Secondary | ICD-10-CM | POA: Diagnosis present

## 2022-08-10 DIAGNOSIS — Z933 Colostomy status: Secondary | ICD-10-CM | POA: Diagnosis not present

## 2022-08-10 DIAGNOSIS — R739 Hyperglycemia, unspecified: Secondary | ICD-10-CM | POA: Diagnosis present

## 2022-08-10 DIAGNOSIS — C20 Malignant neoplasm of rectum: Secondary | ICD-10-CM | POA: Diagnosis present

## 2022-08-10 DIAGNOSIS — Z9221 Personal history of antineoplastic chemotherapy: Secondary | ICD-10-CM

## 2022-08-10 DIAGNOSIS — B9620 Unspecified Escherichia coli [E. coli] as the cause of diseases classified elsewhere: Secondary | ICD-10-CM | POA: Insufficient documentation

## 2022-08-10 DIAGNOSIS — R7881 Bacteremia: Secondary | ICD-10-CM | POA: Diagnosis not present

## 2022-08-10 DIAGNOSIS — Z515 Encounter for palliative care: Secondary | ICD-10-CM | POA: Diagnosis not present

## 2022-08-10 DIAGNOSIS — E2740 Unspecified adrenocortical insufficiency: Secondary | ICD-10-CM | POA: Diagnosis present

## 2022-08-10 DIAGNOSIS — R6521 Severe sepsis with septic shock: Secondary | ICD-10-CM | POA: Diagnosis present

## 2022-08-10 DIAGNOSIS — C775 Secondary and unspecified malignant neoplasm of intrapelvic lymph nodes: Secondary | ICD-10-CM | POA: Diagnosis present

## 2022-08-10 DIAGNOSIS — C787 Secondary malignant neoplasm of liver and intrahepatic bile duct: Secondary | ICD-10-CM | POA: Diagnosis present

## 2022-08-10 DIAGNOSIS — Z66 Do not resuscitate: Secondary | ICD-10-CM | POA: Diagnosis present

## 2022-08-10 DIAGNOSIS — Z923 Personal history of irradiation: Secondary | ICD-10-CM

## 2022-08-10 DIAGNOSIS — B952 Enterococcus as the cause of diseases classified elsewhere: Secondary | ICD-10-CM | POA: Diagnosis present

## 2022-08-10 DIAGNOSIS — G893 Neoplasm related pain (acute) (chronic): Secondary | ICD-10-CM | POA: Diagnosis present

## 2022-08-10 DIAGNOSIS — T380X5A Adverse effect of glucocorticoids and synthetic analogues, initial encounter: Secondary | ICD-10-CM | POA: Diagnosis present

## 2022-08-10 LAB — BLOOD CULTURE ID PANEL (REFLEXED) - BCID2

## 2022-08-10 LAB — CBC WITH DIFFERENTIAL/PLATELET
Abs Immature Granulocytes: 1 10*3/uL — ABNORMAL HIGH (ref 0.00–0.07)
Basophils Absolute: 0 10*3/uL (ref 0.0–0.1)
Basophils Relative: 1 %
Eosinophils Absolute: 0 10*3/uL (ref 0.0–0.5)
Eosinophils Relative: 0 %
HCT: 25.2 % — ABNORMAL LOW (ref 39.0–52.0)
Hemoglobin: 8.3 g/dL — ABNORMAL LOW (ref 13.0–17.0)
Immature Granulocytes: 24 %
Lymphocytes Relative: 1 %
Lymphs Abs: 0.1 10*3/uL — ABNORMAL LOW (ref 0.7–4.0)
MCH: 28.6 pg (ref 26.0–34.0)
MCHC: 32.9 g/dL (ref 30.0–36.0)
MCV: 86.9 fL (ref 80.0–100.0)
Monocytes Absolute: 0 10*3/uL — ABNORMAL LOW (ref 0.1–1.0)
Monocytes Relative: 1 %
Neutro Abs: 3 10*3/uL (ref 1.7–7.7)
Neutrophils Relative %: 73 %
Platelets: 46 10*3/uL — ABNORMAL LOW (ref 150–400)
RBC: 2.9 MIL/uL — ABNORMAL LOW (ref 4.22–5.81)
RDW: 22.4 % — ABNORMAL HIGH (ref 11.5–15.5)
WBC: 4.1 10*3/uL (ref 4.0–10.5)
nRBC: 0 % (ref 0.0–0.2)

## 2022-08-10 LAB — URINALYSIS, ROUTINE W REFLEX MICROSCOPIC
Bilirubin Urine: NEGATIVE
Glucose, UA: NEGATIVE mg/dL
Ketones, ur: NEGATIVE mg/dL
Nitrite: POSITIVE — AB
Protein, ur: 100 mg/dL — AB
Specific Gravity, Urine: 1.011 (ref 1.005–1.030)
WBC, UA: >50 WBC/hpf (ref 0–5)
pH: 5 (ref 5.0–8.0)

## 2022-08-10 LAB — I-STAT CHEM 8, ED
BUN: 37 mg/dL — ABNORMAL HIGH (ref 6–20)
Calcium, Ion: 1 mmol/L — ABNORMAL LOW (ref 1.15–1.40)
Chloride: 99 mmol/L (ref 98–111)
Creatinine, Ser: 2.7 mg/dL — ABNORMAL HIGH (ref 0.61–1.24)
Glucose, Bld: 129 mg/dL — ABNORMAL HIGH (ref 70–99)
HCT: 22 % — ABNORMAL LOW (ref 39.0–52.0)
Hemoglobin: 7.5 g/dL — ABNORMAL LOW (ref 13.0–17.0)
Potassium: 4.5 mmol/L (ref 3.5–5.1)
Sodium: 131 mmol/L — ABNORMAL LOW (ref 135–145)
TCO2: 18 mmol/L — ABNORMAL LOW (ref 22–32)

## 2022-08-10 LAB — PROTIME-INR
INR: 1.6 — ABNORMAL HIGH (ref 0.8–1.2)
Prothrombin Time: 19 seconds — ABNORMAL HIGH (ref 11.4–15.2)

## 2022-08-10 LAB — BASIC METABOLIC PANEL
Anion gap: 12 (ref 5–15)
BUN: 36 mg/dL — ABNORMAL HIGH (ref 6–20)
CO2: 20 mmol/L — ABNORMAL LOW (ref 22–32)
Calcium: 8.3 mg/dL — ABNORMAL LOW (ref 8.9–10.3)
Chloride: 103 mmol/L (ref 98–111)
Creatinine, Ser: 1.73 mg/dL — ABNORMAL HIGH (ref 0.61–1.24)
GFR, Estimated: 47 mL/min — ABNORMAL LOW (ref >60)
Glucose, Bld: 175 mg/dL — ABNORMAL HIGH (ref 70–99)
Potassium: 3.7 mmol/L (ref 3.5–5.1)
Sodium: 135 mmol/L (ref 135–145)

## 2022-08-10 LAB — LACTIC ACID, PLASMA
Lactic Acid, Venous: 8.6 mmol/L (ref 0.5–1.9)
Lactic Acid, Venous: 5.3 mmol/L (ref 0.5–1.9)

## 2022-08-10 LAB — GLUCOSE, CAPILLARY
Glucose-Capillary: 160 mg/dL — ABNORMAL HIGH (ref 70–99)
Glucose-Capillary: 162 mg/dL — ABNORMAL HIGH (ref 70–99)
Glucose-Capillary: 162 mg/dL — ABNORMAL HIGH (ref 70–99)
Glucose-Capillary: 163 mg/dL — ABNORMAL HIGH (ref 70–99)

## 2022-08-10 LAB — COMPREHENSIVE METABOLIC PANEL
ALT: 47 U/L — ABNORMAL HIGH (ref 0–44)
AST: 61 U/L — ABNORMAL HIGH (ref 15–41)
Albumin: 1.5 g/dL — ABNORMAL LOW (ref 3.5–5.0)
Alkaline Phosphatase: 262 U/L — ABNORMAL HIGH (ref 38–126)
Anion gap: 14 (ref 5–15)
BUN: 47 mg/dL — ABNORMAL HIGH (ref 6–20)
CO2: 17 mmol/L — ABNORMAL LOW (ref 22–32)
Calcium: 7.5 mg/dL — ABNORMAL LOW (ref 8.9–10.3)
Chloride: 98 mmol/L (ref 98–111)
Creatinine, Ser: 2.77 mg/dL — ABNORMAL HIGH (ref 0.61–1.24)
GFR, Estimated: 27 mL/min — ABNORMAL LOW (ref >60)
Glucose, Bld: 132 mg/dL — ABNORMAL HIGH (ref 70–99)
Potassium: 4.5 mmol/L (ref 3.5–5.1)
Sodium: 129 mmol/L — ABNORMAL LOW (ref 135–145)
Total Bilirubin: 2.2 mg/dL — ABNORMAL HIGH (ref 0.3–1.2)
Total Protein: 4.5 g/dL — ABNORMAL LOW (ref 6.5–8.1)

## 2022-08-10 LAB — MRSA NEXT GEN BY PCR, NASAL: MRSA by PCR Next Gen: DETECTED — AB

## 2022-08-10 LAB — STREP PNEUMONIAE URINARY ANTIGEN: Strep Pneumo Urinary Antigen: NEGATIVE

## 2022-08-10 LAB — MAGNESIUM
Magnesium: 1.5 mg/dL — ABNORMAL LOW (ref 1.7–2.4)
Magnesium: 3.1 mg/dL — ABNORMAL HIGH (ref 1.7–2.4)

## 2022-08-10 LAB — RESP PANEL BY RT-PCR (RSV, FLU A&B, COVID)  RVPGX2
Influenza A by PCR: NEGATIVE
Influenza B by PCR: NEGATIVE
Resp Syncytial Virus by PCR: NEGATIVE
SARS Coronavirus 2 by RT PCR: NEGATIVE

## 2022-08-10 LAB — PHOSPHORUS: Phosphorus: 3 mg/dL (ref 2.5–4.6)

## 2022-08-10 LAB — PROCALCITONIN: Procalcitonin: 130.49 ng/mL

## 2022-08-10 MED ORDER — ALBUTEROL SULFATE (2.5 MG/3ML) 0.083% IN NEBU: 2.5000 mg | INHALATION_SOLUTION | RESPIRATORY_TRACT | Status: DC

## 2022-08-10 MED ORDER — SODIUM CHLORIDE 0.9 % IV SOLN: INTRAVENOUS | Status: AC

## 2022-08-10 MED ORDER — LACTATED RINGERS IV BOLUS
3000.0000 mL | Freq: Once | INTRAVENOUS | Status: AC
  Administered 2022-08-10: 3000 mL via INTRAVENOUS

## 2022-08-10 MED ORDER — MORPHINE SULFATE (PF) 2 MG/ML IV SOLN
2.0000 mg | Freq: Once | INTRAVENOUS | Status: AC
  Administered 2022-08-10: 2 mg via INTRAVENOUS
  Filled 2022-08-10: qty 1

## 2022-08-10 MED ORDER — SODIUM CHLORIDE 0.9 % IV SOLN
2.0000 g | Freq: Once | INTRAVENOUS | Status: AC
  Administered 2022-08-10: 2 g via INTRAVENOUS
  Filled 2022-08-10: qty 12.5

## 2022-08-10 MED ORDER — ACETAMINOPHEN 10 MG/ML IV SOLN
1000.0000 mg | INTRAVENOUS | Status: AC
  Administered 2022-08-10: 1000 mg via INTRAVENOUS
  Filled 2022-08-10: qty 100

## 2022-08-10 MED ORDER — ONDANSETRON HCL 4 MG/2ML IJ SOLN
4.0000 mg | Freq: Four times a day (QID) | INTRAMUSCULAR | Status: DC | PRN
  Administered 2022-08-14: 4 mg via INTRAVENOUS
  Filled 2022-08-10: qty 2

## 2022-08-10 MED ORDER — METRONIDAZOLE 500 MG/100ML IV SOLN
500.0000 mg | Freq: Once | INTRAVENOUS | Status: AC
  Administered 2022-08-10: 500 mg via INTRAVENOUS
  Filled 2022-08-10: qty 100

## 2022-08-10 MED ORDER — MAGNESIUM SULFATE 2 GM/50ML IV SOLN
2.0000 g | Freq: Once | INTRAVENOUS | Status: AC
  Administered 2022-08-10: 2 g via INTRAVENOUS
  Filled 2022-08-10: qty 50

## 2022-08-10 MED ORDER — HYDROCORTISONE SOD SUC (PF) 100 MG IJ SOLR
50.0000 mg | Freq: Four times a day (QID) | INTRAMUSCULAR | Status: DC
  Administered 2022-08-10 – 2022-08-11 (×5): 50 mg via INTRAVENOUS
  Filled 2022-08-10 (×5): qty 2

## 2022-08-10 MED ORDER — MORPHINE SULFATE (PF) 2 MG/ML IV SOLN
2.0000 mg | INTRAVENOUS | Status: DC | PRN
  Administered 2022-08-10: 2 mg via INTRAVENOUS
  Filled 2022-08-10: qty 1

## 2022-08-10 MED ORDER — ACETAMINOPHEN 325 MG PO TABS
650.0000 mg | ORAL_TABLET | Freq: Four times a day (QID) | ORAL | Status: DC | PRN
  Administered 2022-08-16 – 2022-08-18 (×2): 650 mg via ORAL
  Filled 2022-08-10 (×2): qty 2

## 2022-08-10 MED ORDER — VASOPRESSIN 20 UNITS/100 ML INFUSION FOR SHOCK
0.0300 [IU]/min | INTRAVENOUS | Status: DC
  Administered 2022-08-10: 0.03 [IU]/min via INTRAVENOUS
  Filled 2022-08-10: qty 100

## 2022-08-10 MED ORDER — DOCUSATE SODIUM 100 MG PO CAPS: 100.0000 mg | ORAL_CAPSULE | Freq: Two times a day (BID) | ORAL | Status: DC | PRN

## 2022-08-10 MED ORDER — OXYCODONE-ACETAMINOPHEN 5-325 MG PO TABS
1.0000 | ORAL_TABLET | Freq: Four times a day (QID) | ORAL | Status: DC | PRN
  Administered 2022-08-12 – 2022-08-19 (×8): 1 via ORAL
  Filled 2022-08-10 (×10): qty 1

## 2022-08-10 MED ORDER — SODIUM CHLORIDE 0.9 % IV BOLUS
500.0000 mL | Freq: Once | INTRAVENOUS | Status: AC
  Administered 2022-08-10: 500 mL via INTRAVENOUS

## 2022-08-10 MED ORDER — LEVETIRACETAM IN NACL 500 MG/100ML IV SOLN
500.0000 mg | Freq: Two times a day (BID) | INTRAVENOUS | Status: DC
  Administered 2022-08-10 – 2022-08-12 (×4): 500 mg via INTRAVENOUS
  Filled 2022-08-10 (×4): qty 100

## 2022-08-10 MED ORDER — SODIUM CHLORIDE 0.9 % IV SOLN: 2.0000 g | Freq: Two times a day (BID) | INTRAVENOUS | Status: DC

## 2022-08-10 MED ORDER — ALBUTEROL SULFATE (2.5 MG/3ML) 0.083% IN NEBU: 2.5000 mg | INHALATION_SOLUTION | RESPIRATORY_TRACT | Status: DC | PRN

## 2022-08-10 MED ORDER — LACTATED RINGERS IV SOLN: INTRAVENOUS | Status: DC

## 2022-08-10 MED ORDER — MIDAZOLAM HCL 2 MG/2ML IJ SOLN
1.0000 mg | Freq: Once | INTRAMUSCULAR | Status: DC
  Filled 2022-08-10: qty 2

## 2022-08-10 MED ORDER — NOREPINEPHRINE 4 MG/250ML-% IV SOLN
INTRAVENOUS | Status: AC
  Administered 2022-08-10: 4 mg
  Filled 2022-08-10: qty 250

## 2022-08-10 MED ORDER — VANCOMYCIN HCL 1500 MG/300ML IV SOLN
1500.0000 mg | Freq: Once | INTRAVENOUS | Status: AC
  Administered 2022-08-10: 1500 mg via INTRAVENOUS
  Filled 2022-08-10: qty 300

## 2022-08-10 MED ORDER — SODIUM CHLORIDE 0.9 % IV SOLN
1.0000 g | Freq: Two times a day (BID) | INTRAVENOUS | Status: DC
  Administered 2022-08-10 – 2022-08-11 (×2): 1 g via INTRAVENOUS
  Filled 2022-08-10 (×2): qty 20

## 2022-08-10 MED ORDER — MAGNESIUM SULFATE 2 GM/50ML IV SOLN
INTRAVENOUS | Status: AC
  Filled 2022-08-10: qty 50

## 2022-08-10 MED ORDER — VANCOMYCIN HCL IN DEXTROSE 1-5 GM/200ML-% IV SOLN: 1000.0000 mg | Freq: Once | INTRAVENOUS | Status: DC

## 2022-08-10 MED ORDER — ALBUMIN HUMAN 25 % IV SOLN
50.0000 g | Freq: Four times a day (QID) | INTRAVENOUS | Status: AC
  Administered 2022-08-10 – 2022-08-11 (×3): 50 g via INTRAVENOUS
  Filled 2022-08-10 (×4): qty 200

## 2022-08-10 MED ORDER — LIDOCAINE-EPINEPHRINE (PF) 2 %-1:200000 IJ SOLN
10.0000 mL | Freq: Once | INTRAMUSCULAR | Status: DC
  Filled 2022-08-10: qty 20

## 2022-08-10 MED ORDER — VANCOMYCIN VARIABLE DOSE PER UNSTABLE RENAL FUNCTION (PHARMACIST DOSING): Status: DC

## 2022-08-10 MED ORDER — NOREPINEPHRINE 4 MG/250ML-% IV SOLN: 5.0000 ug/min | INTRAVENOUS | Status: DC

## 2022-08-10 MED ORDER — CHLORHEXIDINE GLUCONATE CLOTH 2 % EX PADS
6.0000 | MEDICATED_PAD | Freq: Every day | CUTANEOUS | Status: DC
  Administered 2022-08-11 – 2022-08-19 (×9): 6 via TOPICAL

## 2022-08-10 MED ORDER — NOREPINEPHRINE 4 MG/250ML-% IV SOLN
0.0000 ug/min | INTRAVENOUS | Status: DC
  Administered 2022-08-10: 9 ug/min via INTRAVENOUS
  Administered 2022-08-10: 30 ug/min via INTRAVENOUS
  Administered 2022-08-10: 6 ug/min via INTRAVENOUS
  Administered 2022-08-11: 8 ug/min via INTRAVENOUS
  Administered 2022-08-12: 3 ug/min via INTRAVENOUS
  Filled 2022-08-10 (×5): qty 250

## 2022-08-10 MED ORDER — INSULIN ASPART 100 UNIT/ML IJ SOLN
0.0000 [IU] | INTRAMUSCULAR | Status: DC
  Administered 2022-08-10 – 2022-08-11 (×3): 3 [IU] via SUBCUTANEOUS
  Administered 2022-08-11 (×3): 2 [IU] via SUBCUTANEOUS
  Administered 2022-08-11 – 2022-08-12 (×3): 3 [IU] via SUBCUTANEOUS
  Administered 2022-08-12: 2 [IU] via SUBCUTANEOUS

## 2022-08-10 MED ORDER — IPRATROPIUM-ALBUTEROL 0.5-2.5 (3) MG/3ML IN SOLN
3.0000 mL | Freq: Four times a day (QID) | RESPIRATORY_TRACT | Status: DC
  Administered 2022-08-10 – 2022-08-15 (×18): 3 mL via RESPIRATORY_TRACT
  Filled 2022-08-10 (×20): qty 3

## 2022-08-10 MED ORDER — SODIUM CHLORIDE 0.9 % IV SOLN
250.0000 mL | INTRAVENOUS | Status: DC
  Administered 2022-08-10 – 2022-08-14 (×2): 250 mL via INTRAVENOUS

## 2022-08-10 MED ORDER — PANTOPRAZOLE SODIUM 40 MG IV SOLR
40.0000 mg | INTRAVENOUS | Status: DC
  Administered 2022-08-10 – 2022-08-11 (×2): 40 mg via INTRAVENOUS
  Filled 2022-08-10 (×2): qty 10

## 2022-08-10 MED ORDER — POLYETHYLENE GLYCOL 3350 17 G PO PACK: 17.0000 g | PACK | Freq: Every day | ORAL | Status: DC | PRN

## 2022-08-10 MED ORDER — CALCIUM GLUCONATE-NACL 1-0.675 GM/50ML-% IV SOLN
1.0000 g | Freq: Once | INTRAVENOUS | Status: AC
  Administered 2022-08-10: 1000 mg via INTRAVENOUS
  Filled 2022-08-10: qty 50

## 2022-08-10 NOTE — Consult Note (Signed)
James Coffey 1971/02/15  [id number].    Requesting MD: Philip Aspen, MD Chief Complaint/Reason for Consult: sepsis, history of metastatic rectal cancer   A Bosnian translator was used for this visit  HPI:  52 y.o. male known to our service with complex medical history significant for diverting colostomy in the setting of fournier's gangrene s/p multiple debridements in 2022, followed by diagnosis of rectal cancer with liver metastasis s/p partial left hepatectomy in March 2023 by Dr. [name]. He then underwent percutaneous ablation of a right liver tumor in early May as well as abdominal perineal resection, robotic approach 02/04/22 by Dr. [name]. He completed radiation therapy June 2023. He had a pre-sacral fluid collection that has been drained by IR. Unfortunately his perineal wound broke down and is requiring wet-to-dry dressing changes, this was last evaluated in our office on 07/07/22 by Dr. [name].  During her exam she made note of a perineal mass that she suspected was inflammatory but that would require biopsy if it grew in size. Last week the patient underwent left frontal craniotomy for resection of a tumor 08/05/22 by Dr. [name], path w/ moderately differentiated adenocarcinoma consistent with known history of metastatic colon cancer.  The patient presents to the ED today with cc fever. Hx limited 2/2 patients confusion. Reports 3 days of fever along with chest pain, sob, dry cough and generalized abdominal pain. Urinary symptoms are hard to delve out but reports he has been able to void prior to presentation. 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 There is a possible fistula between the bladder and the pre-sacral space. He has a peristomal hernia without signs of obstruction. He has new  bilateral ground glass opacities of his lungs. Resp panel negative for flu or covid. His lactic acid level is 5.3 (down from 8.6  2 hours ago). Urine cultures and blood cultures are pending. Urology consulted and foley place. UOP cloudy bloody. We were asked to see. I saw the patient with CCM.   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   pt denies   Cancer ([place])    rectal cancer with mets to liver   Tobacco abuse     Past Surgical History:  Procedure Laterality Date   APPLICATION OF CRANIAL NAVIGATION N/A 08/05/2022   Procedure: APPLICATION OF CRANIAL NAVIGATION;  Surgeon: [name], MD;  Location: [place];  Service: Neurosurgery;  Laterality: N/A;   CRANIOTOMY Left 08/05/2022   Procedure: Left frontal craniotomy for resection of tumor with Stealth;  Surgeon: [name], MD;  Location: [place];  Service: Neurosurgery;  Laterality: Left;  RM 19 to follow   HERNIA REPAIR     INCISION AND DRAINAGE ABSCESS N/A 12/01/2020   Procedure: INCISION AND DRAINAGE ABSCESS;  Surgeon:  Boston, MD;  Location[facility];  Service: General;  Laterality: N/A;   INCISION AND DRAINAGE ABSCESS N/A 12/04/2020   Procedure: INCISION AND DRAINAGE FOURNIERS GANGRENE;  Surgeon: [name], MD;  Location[facility];  Service: General;  Laterality: N/A;   INCISION AND DRAINAGE PERIRECTAL ABSCESS  N/A 11/30/2020   Procedure: INCISION AND DEBRIDEMENT OF PERIRECTAL ABSCESS AND RECTAL WALL BIOPSIES;  Surgeon:  Boston, MD;  [facility];  Service: General;  Laterality: N/A;   INGUINAL HERNIA REPAIR Right 2007   pt denies   IR IMAGING GUIDED PORT INSERTION  05/07/2021   IR RADIOLOGIST EVAL & MGMT  08/27/2021   IR RADIOLOGIST EVAL & MGMT  09/12/2021   IR RADIOLOGIST EVAL & MGMT   11/10/2021   IR RADIOLOGIST EVAL & MGMT  01/05/2022   IR SINUS/FIST TUBE CHK-NON GI  04/28/2022   LAPAROSCOPIC LOOP COLOSTOMY N/A 12/13/2020   Procedure: DIAGNOSTIC LAPAROSCOPY WITH  OPEN DESCENDING LOOP COLOSTOMY;  Surgeon: Armandina Gemma, MD;  [facility];  Service: General;  Laterality: N/A;   LAPAROSCOPY N/A 08/28/2021   Procedure: LAPAROSCOPY DIAGNOSTIC;  Surgeon: Dwan Bolt, MD;  Location: [place];  Service: General;  Laterality: N/A;   OPEN PARTIAL HEPATECTOMY  Left 08/28/2021   Procedure: OPEN LEFT LATERAL HEPATECTOMY WITH INTRAOPERATIVE ULTRASOUND;  Surgeon: Dwan Bolt, MD;  Location: [place];  Service: General;  Laterality: Left;   RADIOLOGY WITH ANESTHESIA N/A 10/08/2021   Procedure: MICROWAVE ABLATION;  Surgeon: Aletta Edouard, MD;  [facility];  Service: Radiology;  Laterality: N/A;   RECTAL SURGERY     XI ROBOTIC ASSISTED PARASTOMAL HERNIA REPAIR N/A 02/04/2022   Procedure: ROBOTIC ABDOMINAL PERINEAL RESECTION, PARASTOMAL HERNIA REPAIR, BILATERAL TAP BLOCK;  Surgeon: [name], MD;  [facility];  Service: General;  Laterality: N/A;    Social History:  reports that he has been smoking cigarettes. He has been smoking an average of .25 packs per day. He has never used smokeless tobacco. He reports that he does not drink alcohol and does not use drugs.  Allergies:  Allergies  Allergen Reactions   Hydrocodone Itching   Pork-Derived Products Other (See Comments)    Do not eat pork    (Not in a hospital admission)    Physical Exam: Blood pressure 118/78, pulse 100, temperature 99.2 F (37.3 C), temperature source Rectal, resp. rate (!) 27, height '5\' 9"'$  (1.753 m), weight 77.1 kg, SpO2 96 %. General: Chronically ill appearing male who is laying in bed HEENT: L frontal craniotomy incision with staples in place cdi. Pupils equal and round. No obvious lesions of nose or ears. MMM  Heart: Tachycardic with regular rhythm.  Lungs: Tachypneic. Distant breath  sounds with course breath sounds at bases b/l. On o2.  Abd:  Soft, NT/ND, +BS. Ostomy viable appearing with liquid stool in ostomy bag. Peristomal hernia is soft.  GU: Foley in place with bloody urine. Perineal wound appears to be ~1.5-2cm deep with some pale tissue at the base. No evidence of surrounding skin changes. There is some thin, clear drainage from the opening.   MS: Trace ble edema Skin: warm and dry  Psych: A&Ox2 Neuro: Able speech, moves all extremities, gait not assessed  Results for orders placed or performed during the hospital encounter of 08/10/22 (from the past 48 hour(s))  Comprehensive metabolic panel     Status: Abnormal   Collection Time: 08/10/22  8:09 AM  Result Value Ref Range   Sodium 129 (L) 135 - 145 mmol/L   Potassium 4.5 3.5 - 5.1 mmol/L   Chloride 98 98 - 111 mmol/L   CO2 17 (L) 22 - 32 mmol/L   Glucose, Bld 132 (H) 70 - 99 mg/dL    Comment: Glucose reference range applies only to samples taken after fasting  for at least 8 hours.   BUN 47 (H) 6 - 20 mg/dL   Creatinine, Ser 2.77 (H) 0.61 - 1.24 mg/dL   Calcium 7.5 (L) 8.9 - 10.3 mg/dL   Total Protein 4.5 (L) 6.5 - 8.1 g/dL   Albumin 1.5 (L) 3.5 - 5.0 g/dL   AST 61 (H) 15 - 41 U/L   ALT 47 (H) 0 - 44 U/L    Comment: RESULT CONFIRMED BY MANUAL DILUTION   Alkaline Phosphatase 262 (H) 38 - 126 U/L   Total Bilirubin 2.2 (H) 0.3 - 1.2 mg/dL   GFR, Estimated 27 (L) >60 mL/min    Comment: (NOTE) Calculated using the CKD-EPI Creatinine Equation (2021)    Anion gap 14 5 - 15    Comment: Performed at [facility] [address] [place], [place] [address]  Lactic acid, plasma     Status: Abnormal   Collection Time: 08/10/22  8:09 AM  Result Value Ref Range   Lactic Acid, Venous 8.6 (HH) 0.5 - 1.9 mmol/L    Comment: CRITICAL RESULT CALLED TO, READ BACK BY AND VERIFIED WITH BRIANNA LUSZCAK RN.'@[id number]'$  ON 3.4.24 BY TCALDWELL MT. Performed at [facility], [place] [address] [place], [place] [address]    CBC with Differential     Status: Abnormal   Collection Time: 08/10/22  8:09 AM  Result Value Ref Range   WBC 4.1 4.0 - 10.5 K/uL    Comment: RESULTS OKAY PER BREANNA LUSZCAK RN [id number] SGALL[address]   RBC 2.90 (L) 4.22 - 5.81 MIL/uL   Hemoglobin 8.3 (L) 13.0 - 17.0 g/dL   HCT 25.2 (L) 39.0 - 52.0 %   MCV 86.9 80.0 - 100.0 fL   MCH 28.6 26.0 - 34.0 pg   MCHC 32.9 30.0 - 36.0 g/dL   RDW 22.4 (H) 11.5 - 15.5 %   Platelets 46 (L) 150 - 400 K/uL    Comment: SPECIMEN CHECKED FOR CLOTS Immature Platelet Fraction may be clinically indicated, consider ordering this additional test [id number] REPEATED TO VERIFY    nRBC 0.0 0.0 - 0.2 %   Neutrophils Relative % 73 %   Neutro Abs 3.0 1.7 - 7.7 K/uL   Lymphocytes Relative 1 %   Lymphs Abs 0.1 (L) 0.7 - 4.0 K/uL   Monocytes Relative 1 %   Monocytes Absolute 0.0 (L) 0.1 - 1.0 K/uL   Eosinophils Relative 0 %   Eosinophils Absolute 0.0 0.0 - 0.5 K/uL   Basophils Relative 1 %   Basophils Absolute 0.0 0.0 - 0.1 K/uL   Immature Granulocytes 24 %   Abs Immature Granulocytes 1.00 (H) 0.00 - 0.07 K/uL    Comment: Performed at [facility], [place] [address] [place], [place] [address]  Protime-INR     Status: Abnormal   Collection Time: 08/10/22  8:09 AM  Result Value Ref Range   Prothrombin Time 19.0 (H) 11.4 - 15.2 seconds   INR 1.6 (H) 0.8 - 1.2    Comment: (NOTE) INR goal varies based on device and disease states. Performed at [facility], [place] [address], [place], [place] [address]   Resp panel by RT-PCR (RSV, Flu A&B, Covid) Anterior Nasal Swab     Status: None   Collection Time: 08/10/22  8:25 AM   Specimen: Anterior Nasal Swab  Result Value Ref Range   SARS Coronavirus 2 by RT PCR NEGATIVE NEGATIVE   Influenza A by PCR NEGATIVE NEGATIVE   Influenza B  by PCR NEGATIVE NEGATIVE    Comment: (NOTE) The Xpert Xpress SARS-CoV-2/FLU/RSV plus assay is intended as an aid in the diagnosis of influenza from Nasopharyngeal swab  specimens and should not be used as a sole basis for treatment. Nasal washings and aspirates are unacceptable for Xpert Xpress SARS-CoV-2/FLU/RSV testing.  Fact Sheet for Patients: EntrepreneurPulse.com.au  Fact Sheet for Healthcare Providers: IncredibleEmployment.be  This test is not yet approved or cleared by the [place] FDA and has been authorized for detection and/or diagnosis of SARS-CoV-2 by FDA under an Emergency Use Authorization (EUA). This EUA will remain in effect (meaning this test can be used) for the duration of the COVID-19 declaration under Section 564(b)(1) of the Act, 21 U.S.C. section 360bbb-3(b)(1), unless the authorization is terminated or revoked.     Resp Syncytial Virus by PCR NEGATIVE NEGATIVE    Comment: (NOTE) Fact Sheet for Patients: EntrepreneurPulse.com.au  Fact Sheet for Healthcare Providers: IncredibleEmployment.be  This test is not yet approved or cleared by the [place] FDA and has been authorized for detection and/or diagnosis of SARS-CoV-2 by FDA under an Emergency Use Authorization (EUA). This EUA will remain in effect (meaning this test can be used) for the duration of the COVID-19 declaration under Section 564(b)(1) of the Act, 21 U.S.C. section 360bbb-3(b)(1), unless the authorization is terminated or revoked.  Performed at [facility], [place] [address] [place], [place] [address]   I-stat chem 8, ED (not at [facility], [facility] or [facility])     Status: Abnormal   Collection Time: 08/10/22  8:53 AM  Result Value Ref Range   Sodium 131 (L) 135 - 145 mmol/L   Potassium 4.5 3.5 - 5.1 mmol/L   Chloride 99 98 - 111 mmol/L   BUN 37 (H) 6 - 20 mg/dL   Creatinine, Ser 2.70 (H) 0.61 - 1.24 mg/dL   Glucose, Bld 129 (H) 70 - 99 mg/dL    Comment: Glucose reference range applies only to samples taken after fasting for at least 8 hours.   Calcium, Ion 1.00 (L) 1.15 - 1.40  mmol/L   TCO2 18 (L) 22 - 32 mmol/L   Hemoglobin 7.5 (L) 13.0 - 17.0 g/dL   HCT 22.0 (L) 39.0 - 52.0 %  Lactic acid, plasma     Status: Abnormal   Collection Time: 08/10/22 10:07 AM  Result Value Ref Range   Lactic Acid, Venous 5.3 (HH) 0.5 - 1.9 mmol/L    Comment: CRITICAL VALUE NOTED. VALUE IS CONSISTENT WITH PREVIOUSLY REPORTED/CALLED VALUE Performed at [facility], [place] [address] [place], [place] [address]   Urinalysis, Routine w reflex microscopic -Urine, Clean Catch     Status: Abnormal   Collection Time: 08/10/22 11:40 AM  Result Value Ref Range   Color, Urine AMBER (A) YELLOW    Comment: BIOCHEMICALS MAY BE AFFECTED BY COLOR   APPearance CLOUDY (A) CLEAR   Specific Gravity, Urine 1.011 1.005 - 1.030   pH 5.0 5.0 - 8.0   Glucose, UA NEGATIVE NEGATIVE mg/dL   Hgb urine dipstick LARGE (A) NEGATIVE   Bilirubin Urine NEGATIVE NEGATIVE   Ketones, ur NEGATIVE NEGATIVE mg/dL   Protein, ur 100 (A) NEGATIVE mg/dL   Nitrite POSITIVE (A) NEGATIVE   Leukocytes,Ua SMALL (A) NEGATIVE   RBC / HPF 21-50 0 - 5 RBC/hpf   WBC, UA >50 0 - 5 WBC/hpf   Bacteria, UA MANY (A) NONE SEEN   Squamous Epithelial / HPF 0-5 0 - 5 /HPF  Amorphous Crystal PRESENT     Comment: Performed at [facility], [place] [address], [place], [place] [address]   CT CHEST ABDOMEN PELVIS WO CONTRAST  Result Date: 08/10/2022 CLINICAL DATA:  History of metastatic colon cancer and recurrent intra-abdominal abscesses with sepsis, right lung rhonchi. * Tracking Code: BO * EXAM: CT CHEST, ABDOMEN AND PELVIS WITHOUT CONTRAST TECHNIQUE: Multidetector CT imaging of the chest, abdomen and pelvis was performed following the standard protocol without IV contrast. RADIATION DOSE REDUCTION: This exam was performed according to the departmental dose-optimization program which includes automated exposure control, adjustment of the mA and/or kV according to patient size and/or use of iterative reconstruction technique.  COMPARISON:  Multiple priors including CT July 13, 2022 and July 21, 2022. FINDINGS: Despite efforts by the technologist and patient, motion artifact is present on today's exam and could not be eliminated. This reduces exam sensitivity and specificity. CT CHEST FINDINGS Cardiovascular: Accessed right chest Port-A-Cath with tip at the superior cavoatrial junction. Gas in the brachiocephalic vein from vascular access. Aortic atherosclerosis. Normal size heart. No significant pericardial effusion/thickening. Mediastinum/Nodes: Suspicious thyroid nodule. No pathologically enlarged mediastinal, hilar or axillary lymph nodes, noting limited sensitivity for the detection of hilar adenopathy on this noncontrast study. The esophagus is grossly unremarkable. Lungs/Pleura: New patchy bilateral ground-glass opacities. Linear band of atelectasis associated with the lobular lingular pulmonary nodule now measuring 2.3 x 2.1 cm in total on image 94/5 previously 2.3 x 1.7 cm. Central right upper lobe pulmonary nodule measures 16 x 9 mm on image 48/5 previously 15 x 9 mm. Upper lobe pleuroparenchymal scarring. Musculoskeletal: No aggressive lytic or blastic lesion of bone. CT ABDOMEN PELVIS FINDINGS Hepatobiliary: Similar surgical changes of prior left hepatic lobe wedge resection. Ablation defect in the anterior right hepatic lobe appears similar. Indexed hepatic lesions are similar to slightly increased in size in comparison to prior, but evaluation is limited by lack of intravenous contrast material. For reference: -medial hepatic dome lesion measures 8.3 x 5.6 cm on image 47/3 previously 7.6 x 6.1 cm. -lateral hepatic dome lesion measures 5.7 x 3.5 cm on image 50/3 previously 5.2 x 3.5 cm. Hyperdense material in the gallbladder may reflect biliary sludge. Gallbladder is nondilated. No biliary ductal dilation. Pancreas: No pancreatic ductal dilation or evidence of acute inflammation. Spleen: No splenomegaly Adrenals/Urinary  Tract: Bilateral adrenal glands are within normal limits. Mild bilateral hydro ureteral nephrosis to the level of the urinary bladder with perinephric and periureteric stranding. Gas in the urinary bladder wall with perivesicular stranding. Intraluminal gas in the gallbladder is well. Possible fistulous connection with the fluid and gas collection in the mesorectal/presacral space on image 122/3. Stomach/Bowel: Stomach is unremarkable for degree of distension. No pathologic dilation of small or large bowel. Similar surgical changes of left lower quadrant end colostomy with large peristomal hernia containing fat and nonobstructed loops of bowel. Similar appearance of the fluid and gas collection in the presacral/mesorectal space measuring approximally 4.2 x 2.7 cm on image 121/3 previously 4.7 x 2.1 cm. Vascular/Lymphatic: Aortic atherosclerosis. Normal caliber abdominal aorta. No pathologically enlarged abdominal or pelvic lymph nodes. Reproductive: Prostate is unremarkable. Other: Increased pelvic soft tissue stranding/fluid. Prior perineal resection. Musculoskeletal: No aggressive lytic or blastic lesion of bone. Diffuse demineralization of bone. Multilevel degenerative changes spine. IMPRESSION: 1. 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 2. Mild bilateral hydroureteral nephrosis to the level of the urinary bladder with perinephric and  periureteric stranding, suggestive of ascending urinary tract infection. 3. Similar appearance of the fluid and gas collection in the presacral/mesorectal space with possible fistulous connection with the urinary bladder. Consider further evaluation with nonemergent CT cystogram. 4. New patchy bilateral ground-glass opacities, which are nonspecific but favored to reflect an infectious or inflammatory etiology. 5. Similar surgical changes of left lower quadrant end colostomy with large  peristomal hernia containing fat and nonobstructed loops of bowel. 6. Indexed hepatic lesions are similar to slightly increased in size in comparison to prior, but evaluation is limited by lack of intravenous contrast material, attention on follow-up contrasted examination suggested. 7. Increased size of the lingular pulmonary nodule at least somewhat enlarged secondary to new associated atelectasis, attention on follow-up imaging suggested. Stable right upper lobe pulmonary nodule. 8. Aortic Atherosclerosis (ICD10-I70.0) and Emphysema (ICD10-J43.9). These results were called by telephone at the time of interpretation on 08/10/2022 at 11:33 am to provider Margaretmary Eddy , who verbally acknowledged these results. Electronically Signed   By: Dahlia Bailiff M.D.   On: 08/10/2022 11:34   CT Head Wo Contrast  Result Date: 08/10/2022 CLINICAL DATA:  Recent craniectomy. EXAM: CT HEAD WITHOUT CONTRAST TECHNIQUE: Contiguous axial images were obtained from the base of the skull through the vertex without intravenous contrast. RADIATION DOSE REDUCTION: This exam was performed according to the departmental dose-optimization program which includes automated exposure control, adjustment of the mA and/or kV according to patient size and/or use of iterative reconstruction technique. COMPARISON:  MRI brain 08/05/2022.  Head CT 07/21/2022. FINDINGS: Brain: Postoperative changes of left frontal craniotomy and resection of the mass in the left middle frontal gyrus. Expected blood products and pneumocephalus in and around the resection cavity. Decreased surrounding vasogenic edema, mass effect and rightward midline shift. No significant change from recent brain MRI. Vascular: No hyperdense vessel or unexpected calcification. Skull: Recent left frontal craniotomy. Sinuses/Orbits: Unremarkable. Other: None. IMPRESSION: Postoperative changes of left frontal craniotomy and resection of the mass in the left middle frontal gyrus. Expected  blood products and pneumocephalus in and around the resection cavity. Decreased surrounding vasogenic edema, mass effect, and rightward midline shift. No significant change from recent brain MRI. Electronically Signed   By: Emmit Alexanders M.D.   On: 08/10/2022 11:07   DG Chest Portable 1 View  Result Date: 08/10/2022 CLINICAL DATA:  Sepsis. EXAM: PORTABLE CHEST 1 VIEW COMPARISON:  November 30, 2020.  July 13, 2022. FINDINGS: Stable cardiomediastinal silhouette. Right internal jugular Port-A-Cath is noted with distal tip in expected position of the SVC. Minimal bibasilar subsegmental atelectasis or edema is noted. Rounded nodular density is noted in the left lower lobe consistent with metastatic disease as noted on prior CT scan. Bony thorax is unremarkable. IMPRESSION: Rounded nodular density seen in left lower lobe consistent with metastatic disease as noted on prior CT scan. Minimal bibasilar subsegmental atelectasis or edema is noted. Electronically Signed   By: Marijo Conception M.D.   On: 08/10/2022 08:48    Metastatic rectal adenocarcinoma: 11/30/2020 - rectal biopsies confirm adenocarcinoma  12/13/2020 - descending loop colostomy, Dr. Harlow Asa S/p neoadjuvant FOLFOX chemotherapy 08/28/2021- open left hepatectomy Dr. [name] 10/2021- ablation of a right liver metastasis  S/p chemotherapy and radiation therapy of rectal mass S/p radiation therapy of lung lesions 02/04/2022 - robotic APR, parastomal hernia repair Dr. [name]  S/p radiation therapy of lung lesions 10-04/2022 IR drainage of pre-sacral collections, injection studies not significant for fistula to bladder at that time. 07/31/2022 S/p single dose of radiation  therapy to the brain 08/05/2022 - left frontal craniotomy for tumor resection, Dr. [name]  Patient currently being considered for Y90 treatments of liver metastasis by IR Dr. Kathlene Cote   Assessment/Plan 52 y/o M undergoing treatment for oligometastatic rectal cancer (see above treatment  timeline) currently with 2 visible liver metastases and a solitary lesion in the right upper lobe of the lung who presents in septic shock. 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 A foley catheter has been placed and Urology has been consulted. His perineal wound is clean without signs of infection and he has a non-obstructive bowel gas pattern. No emergent surgical needs today. I reviewed imaging with my attending and also reached out to Dr. [name] to review his scan. Appreciate CCM mgmt of septic shock. Patient currently requiring pressor support with levophed and vasopressin. Await Urology recs. Agree with establishing clear GOC (primary has consulted palliative care). CCS will follow closely with you.  FEN - NPO, IVF VTE - SCD's. Chemical VTE held due to thrombocytopenia  ID - cefepime 3/4 >> vancomycin 3/4 >> Admit - ICU, CCM service, urology and palliative care consults pending   I reviewed nursing notes, ED provider notes, Consultant Urology notes, hospitalist notes, last 24 h vitals and pain scores, last 48 h intake and output, last 24 h labs and trends, and last 24 h imaging results.  Alferd Apa, [facility] 08/10/2022, 4:16 PM Please see Amion for pager number during day hours 7:00am-4:30pm or 7:00am -11:30am on weekends

## 2022-08-10 NOTE — ED Provider Notes (Signed)
[facility] Provider Note   CSN: [id number] Arrival date & time:        History  Chief Complaint  Patient presents with   Fever    James Coffey is a 52 y.o. male.  History limited due to language barrier.  Bosnian interpreter used to collect history.  52 year old male with metastatic colon cancer to the lungs, brain, and liver status post chemotherapy who presents emergency department from home with fever.  Patient had Foley catheter that was placed and was removed several days ago and reports that he has been having dysuria and fevers for the past 3 days.  Denies any specific locations of pain but says that he is having pain all over.  Per EMS was hypotensive and tachycardic and was given 1 L of fluids prior to arrival.  Patient alert and oriented to self only and unable to provide additional history.  Appears that patient recently had left frontal lobe resection of a brain tumor that was thought to Coffey metastatic disease.  Does also appear to have been hospitalized for perianal abscesses in the past.       Home Medications Prior to Admission medications   Medication Sig Start Date End Date Taking? Authorizing Provider  acetaminophen (TYLENOL) 500 MG tablet Take 2 tablets (1,000 mg total) by mouth every 8 (eight) hours as needed for mild pain or fever. 12/19/20  Yes Barkley Boards R, Coffey-C  acetaminophen-codeine (TYLENOL #3) 300-30 MG tablet Take 1 tablet by mouth every 6 (six) hours as needed for up to 7 days for moderate pain. 08/07/22 08/14/22 Yes [name], MD  cyanocobalamin (VITAMIN B12) 1000 MCG tablet Take 1 tablet (1,000 mcg total) by mouth daily. 07/25/22  Yes Florencia Reasons, MD  dexamethasone (DECADRON) 4 MG tablet Take 1 tablet (4 mg total) by mouth 2 (two) times daily for 7 days. 08/07/22 08/14/22 Yes [name], MD  levETIRAcetam (KEPPRA) 500 MG tablet Take 1 tablet (500 mg total) by mouth 2 (two) times daily. 08/07/22  Yes [name], MD  LORazepam (ATIVAN) 0.5 MG tablet Take 1 tablet (0.5 mg total) by mouth at bedtime. Patient taking differently: Take 0.5 mg by mouth 3 (three) times daily as needed for anxiety or sleep. 07/20/22  Yes [name], MD  oxyCODONE (OXY IR/ROXICODONE) 5 MG immediate release tablet Take 1 tablet (5 mg total) by mouth every 6 (six) hours as needed for moderate pain or severe pain. Patient taking differently: Take 5 mg by mouth 3 (three) times daily as needed for moderate pain or severe pain. 07/20/22  Yes [name], MD  pantoprazole (PROTONIX) 40 MG tablet Take 1 tablet (40 mg total) by mouth daily. 07/25/22 08/24/22 Yes Florencia Reasons, MD  polyethylene glycol (MIRALAX) 17 g packet Take 17 g by mouth daily. Take daily to maintain regular soft colostomy output. Can increase to twice a day if need. Patient taking differently: Take 17 g by mouth daily as needed for mild constipation. 06/24/22  Yes Winferd Humphrey, Coffey-C  prochlorperazine (COMPAZINE) 10 MG tablet Take 1 tablet (10 mg total) by mouth every 6 (six) hours as needed for nausea or vomiting. Patient taking differently: Take 10 mg by mouth 3 (three) times daily as needed for nausea or vomiting. 07/20/22  Yes [name], MD  tamsulosin (FLOMAX) 0.4 MG CAPS capsule Take 1 capsule (0.4 mg total) by mouth daily after supper. 07/25/22  Yes Florencia Reasons, MD  senna-docusate [facility]  S) 8.6-50 MG tablet Take 2 tablets by mouth at bedtime. Patient not taking: Reported on 07/22/2022 07/20/22   [name], MD      Allergies    Hydrocodone and Pork-derived products    Review of Systems   Review of Systems  Physical Exam Updated Vital Signs BP 92/74   Pulse (!) 123   Temp 97.8 F (36.6 C) (Oral)   Resp 20   Ht '5\' 9"'$  (1.753 m)   Wt 76.1 kg   SpO2 93%   BMI 24.78 kg/m  Physical Exam Vitals and nursing note reviewed.  Constitutional:      General: He is in acute distress.     Appearance: He is well-developed. He is  ill-appearing.  HENT:     Head: Atraumatic.     Comments: Craniectomy site on the left parietal and temporal region that is clean dry and intact with staples still in place    Right Ear: External ear normal.     Left Ear: External ear normal.     Nose: Nose normal.  Eyes:     Extraocular Movements: Extraocular movements intact.     Conjunctiva/sclera: Conjunctivae normal.     Pupils: Pupils are equal, round, and reactive to light.  Cardiovascular:     Rate and Rhythm: Regular rhythm. Tachycardia present.     Heart sounds: Normal heart sounds.  Pulmonary:     Effort: Pulmonary effort is normal. No respiratory distress.     Breath sounds: Normal breath sounds. No rhonchi (Right hemifield).  Abdominal:     General: There is no distension.     Palpations: Abdomen is soft. There is no mass.     Tenderness: There is abdominal tenderness. There is no guarding.     Comments: Colostomy in place  Musculoskeletal:     Cervical back: Normal range of motion and neck supple.     Right lower leg: No edema.     Left lower leg: No edema.  Skin:    General: Skin is warm and dry.  Neurological:     Mental Status: He is alert. Mental status is at baseline.     Comments: Cranial nerves II through XII grossly intact.  Right upper and right lower extremity 4/5 strength.  Full strength in other extremities.  Intact sensation light touch elsewhere.  Psychiatric:        Mood and Affect: Mood normal.        Behavior: Behavior normal.     ED Results / Procedures / Treatments   Labs (all labs ordered are listed, but only abnormal results are displayed) Labs Reviewed  COMPREHENSIVE METABOLIC PANEL - Abnormal; Notable for the following components:      Result Value   Sodium 129 (*)    CO2 17 (*)    Glucose, Bld 132 (*)    BUN 47 (*)    Creatinine, Ser 2.77 (*)    Calcium 7.5 (*)    Total Protein 4.5 (*)    Albumin 1.5 (*)    AST 61 (*)    ALT 47 (*)    Alkaline Phosphatase 262 (*)    Total  Bilirubin 2.2 (*)    GFR, Estimated 27 (*)    All other components within normal limits  LACTIC ACID, PLASMA - Abnormal; Notable for the following components:   Lactic Acid, Venous 8.6 (*)    All other components within normal limits  LACTIC ACID, PLASMA - Abnormal; Notable for the following components:  Lactic Acid, Venous 5.3 (*)    All other components within normal limits  CBC WITH DIFFERENTIAL/PLATELET - Abnormal; Notable for the following components:   RBC 2.90 (*)    Hemoglobin 8.3 (*)    HCT 25.2 (*)    RDW 22.4 (*)    Platelets 46 (*)    Lymphs Abs 0.1 (*)    Monocytes Absolute 0.0 (*)    Abs Immature Granulocytes 1.00 (*)    All other components within normal limits  PROTIME-INR - Abnormal; Notable for the following components:   Prothrombin Time 19.0 (*)    INR 1.6 (*)    All other components within normal limits  URINALYSIS, ROUTINE W REFLEX MICROSCOPIC - Abnormal; Notable for the following components:   Color, Urine AMBER (*)    APPearance CLOUDY (*)    Hgb urine dipstick LARGE (*)    Protein, ur 100 (*)    Nitrite POSITIVE (*)    Leukocytes,Ua SMALL (*)    Bacteria, UA MANY (*)    All other components within normal limits  MAGNESIUM - Abnormal; Notable for the following components:   Magnesium 1.5 (*)    All other components within normal limits  GLUCOSE, CAPILLARY - Abnormal; Notable for the following components:   Glucose-Capillary 163 (*)    All other components within normal limits  GLUCOSE, CAPILLARY - Abnormal; Notable for the following components:   Glucose-Capillary 162 (*)    All other components within normal limits  I-STAT CHEM 8, ED - Abnormal; Notable for the following components:   Sodium 131 (*)    BUN 37 (*)    Creatinine, Ser 2.70 (*)    Glucose, Bld 129 (*)    Calcium, Ion 1.00 (*)    TCO2 18 (*)    Hemoglobin 7.5 (*)    HCT 22.0 (*)    All other components within normal limits  RESP PANEL BY RT-PCR (RSV, FLU A&B, COVID)  [id number]   CULTURE, BLOOD (ROUTINE X 2)  CULTURE, BLOOD (ROUTINE X 2)  URINE CULTURE  MRSA NEXT GEN BY PCR, NASAL  PHOSPHORUS  PROCALCITONIN  STREP PNEUMONIAE URINARY ANTIGEN  LEGIONELLA PNEUMOPHILA SEROGP 1 UR AG  CBC  COMPREHENSIVE METABOLIC PANEL  MAGNESIUM  PHOSPHORUS  AMMONIA  LACTIC ACID, PLASMA  PROCALCITONIN    EKG EKG Interpretation  Date/Time:  Monday August 10 2022 08:00:32 EST Ventricular Rate:  105 PR Interval:  100 QRS Duration: 85 QT Interval:  331 QTC Calculation: 438 R Axis:   42 Text Interpretation: Sinus tachycardia Posterior infarct, old Nonspecific repol abnormality, diffuse leads Confirmed by Margaretmary Eddy [phone]) on 08/10/2022 10:51:36 AM  Radiology CT CHEST ABDOMEN PELVIS WO CONTRAST  Result Date: 08/10/2022 CLINICAL DATA:  History of metastatic colon cancer and recurrent intra-abdominal abscesses with sepsis, right lung rhonchi. * Tracking Code: BO * EXAM: CT CHEST, ABDOMEN AND PELVIS WITHOUT CONTRAST TECHNIQUE: Multidetector CT imaging of the chest, abdomen and pelvis was performed following the standard protocol without IV contrast. RADIATION DOSE REDUCTION: This exam was performed according to the departmental dose-optimization program which includes automated exposure control, adjustment of the mA and/or kV according to patient size and/or use of iterative reconstruction technique. COMPARISON:  Multiple priors including CT July 13, 2022 and July 21, 2022. FINDINGS: Despite efforts by the technologist and patient, motion artifact is present on today's exam and could not Coffey eliminated. This reduces exam sensitivity and specificity. CT CHEST FINDINGS Cardiovascular: Accessed right chest Port-A-Cath with tip at the superior cavoatrial  junction. Gas in the brachiocephalic vein from vascular access. Aortic atherosclerosis. Normal size heart. No significant pericardial effusion/thickening. Mediastinum/Nodes: Suspicious thyroid nodule. No pathologically enlarged  mediastinal, hilar or axillary lymph nodes, noting limited sensitivity for the detection of hilar adenopathy on this noncontrast study. The esophagus is grossly unremarkable. Lungs/Pleura: New patchy bilateral ground-glass opacities. Linear band of atelectasis associated with the lobular lingular pulmonary nodule now measuring 2.3 x 2.1 cm in total on image 94/5 previously 2.3 x 1.7 cm. Central right upper lobe pulmonary nodule measures 16 x 9 mm on image 48/5 previously 15 x 9 mm. Upper lobe pleuroparenchymal scarring. Musculoskeletal: No aggressive lytic or blastic lesion of bone. CT ABDOMEN PELVIS FINDINGS Hepatobiliary: Similar surgical changes of prior left hepatic lobe wedge resection. Ablation defect in the anterior right hepatic lobe appears similar. Indexed hepatic lesions are similar to slightly increased in size in comparison to prior, but evaluation is limited by lack of intravenous contrast material. For reference: -medial hepatic dome lesion measures 8.3 x 5.6 cm on image 47/3 previously 7.6 x 6.1 cm. -lateral hepatic dome lesion measures 5.7 x 3.5 cm on image 50/3 previously 5.2 x 3.5 cm. Hyperdense material in the gallbladder may reflect biliary sludge. Gallbladder is nondilated. No biliary ductal dilation. Pancreas: No pancreatic ductal dilation or evidence of acute inflammation. Spleen: No splenomegaly Adrenals/Urinary Tract: Bilateral adrenal glands are within normal limits. Mild bilateral hydro ureteral nephrosis to the level of the urinary bladder with perinephric and periureteric stranding. Gas in the urinary bladder wall with perivesicular stranding. Intraluminal gas in the gallbladder is well. Possible fistulous connection with the fluid and gas collection in the mesorectal/presacral space on image 122/3. Stomach/Bowel: Stomach is unremarkable for degree of distension. No pathologic dilation of small or large bowel. Similar surgical changes of left lower quadrant end colostomy with large  peristomal hernia containing fat and nonobstructed loops of bowel. Similar appearance of the fluid and gas collection in the presacral/mesorectal space measuring approximally 4.2 x 2.7 cm on image 121/3 previously 4.7 x 2.1 cm. Vascular/Lymphatic: Aortic atherosclerosis. Normal caliber abdominal aorta. No pathologically enlarged abdominal or pelvic lymph nodes. Reproductive: Prostate is unremarkable. Other: Increased pelvic soft tissue stranding/fluid. Prior perineal resection. Musculoskeletal: No aggressive lytic or blastic lesion of bone. Diffuse demineralization of bone. Multilevel degenerative changes spine. IMPRESSION: 1. 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 2. Mild bilateral hydroureteral nephrosis to the level of the urinary bladder with perinephric and periureteric stranding, suggestive of ascending urinary tract infection. 3. Similar appearance of the fluid and gas collection in the presacral/mesorectal space with possible fistulous connection with the urinary bladder. Consider further evaluation with nonemergent CT cystogram. 4. New patchy bilateral ground-glass opacities, which are nonspecific but favored to reflect an infectious or inflammatory etiology. 5. Similar surgical changes of left lower quadrant end colostomy with large peristomal hernia containing fat and nonobstructed loops of bowel. 6. Indexed hepatic lesions are similar to slightly increased in size in comparison to prior, but evaluation is limited by lack of intravenous contrast material, attention on follow-up contrasted examination suggested. 7. Increased size of the lingular pulmonary nodule at least somewhat enlarged secondary to new associated atelectasis, attention on follow-up imaging suggested. Stable right upper lobe pulmonary nodule. 8. Aortic Atherosclerosis (ICD10-I70.0) and Emphysema (ICD10-J43.9). These results were called by  telephone at the time of interpretation on 08/10/2022 at 11:33 am to provider Margaretmary Eddy , who verbally acknowledged these results. Electronically  Signed   By: Dahlia Bailiff M.D.   On: 08/10/2022 11:34   CT Head Wo Contrast  Result Date: 08/10/2022 CLINICAL DATA:  Recent craniectomy. EXAM: CT HEAD WITHOUT CONTRAST TECHNIQUE: Contiguous axial images were obtained from the base of the skull through the vertex without intravenous contrast. RADIATION DOSE REDUCTION: This exam was performed according to the departmental dose-optimization program which includes automated exposure control, adjustment of the mA and/or kV according to patient size and/or use of iterative reconstruction technique. COMPARISON:  MRI brain 08/05/2022.  Head CT 07/21/2022. FINDINGS: Brain: Postoperative changes of left frontal craniotomy and resection of the mass in the left middle frontal gyrus. Expected blood products and pneumocephalus in and around the resection cavity. Decreased surrounding vasogenic edema, mass effect and rightward midline shift. No significant change from recent brain MRI. Vascular: No hyperdense vessel or unexpected calcification. Skull: Recent left frontal craniotomy. Sinuses/Orbits: Unremarkable. Other: None. IMPRESSION: Postoperative changes of left frontal craniotomy and resection of the mass in the left middle frontal gyrus. Expected blood products and pneumocephalus in and around the resection cavity. Decreased surrounding vasogenic edema, mass effect, and rightward midline shift. No significant change from recent brain MRI. Electronically Signed   By: Emmit Alexanders M.D.   On: 08/10/2022 11:07   DG Chest Portable 1 View  Result Date: 08/10/2022 CLINICAL DATA:  Sepsis. EXAM: PORTABLE CHEST 1 VIEW COMPARISON:  November 30, 2020.  July 13, 2022. FINDINGS: Stable cardiomediastinal silhouette. Right internal jugular Port-A-Cath is noted with distal tip in expected position of the SVC. Minimal bibasilar  subsegmental atelectasis or edema is noted. Rounded nodular density is noted in the left lower lobe consistent with metastatic disease as noted on prior CT scan. Bony thorax is unremarkable. IMPRESSION: Rounded nodular density seen in left lower lobe consistent with metastatic disease as noted on prior CT scan. Minimal bibasilar subsegmental atelectasis or edema is noted. Electronically Signed   By: Marijo Conception M.D.   On: 08/10/2022 08:48    Procedures Procedures   Medications Ordered in ED Medications  0.9 %  sodium chloride infusion (0 mLs Intravenous Stopped 08/10/22 1418)  norepinephrine (LEVOPHED) '4mg'$  in 234m (0.016 mg/mL) premix infusion (6 mcg/min Intravenous New Bag/Given 08/10/22 1441)  0.9 %  sodium chloride infusion (250 mLs Intravenous New Bag/Given 08/10/22 1417)  vasopressin (PITRESSIN) 20 Units in sodium chloride 0.9 % 100 mL infusion-*FOR SHOCK* (0.03 Units/min Intravenous New Bag/Given 08/10/22 0914)  hydrocortisone sodium succinate (SOLU-CORTEF) 100 MG injection 50 mg (50 mg Intravenous Given 08/10/22 1400)  vancomycin variable dose per unstable renal function (pharmacist dosing) (has no administration in time range)  albumin human 25 % solution 50 g (50 g Intravenous New Bag/Given 08/10/22 1434)  docusate sodium (COLACE) capsule 100 mg (has no administration in time range)  polyethylene glycol (MIRALAX / GLYCOLAX) packet 17 g (has no administration in time range)  lactated ringers infusion ( Intravenous New Bag/Given 08/10/22 1415)  ondansetron (ZOFRAN) injection 4 mg (has no administration in time range)  albuterol (PROVENTIL) (2.5 MG/3ML) 0.083% nebulizer solution 2.5 mg (has no administration in time range)  meropenem (MERREM) 1 g in sodium chloride 0.9 % 100 mL IVPB (1 g Intravenous New Bag/Given 08/10/22 1440)  levETIRAcetam (KEPPRA) IVPB 500 mg/100 mL premix (500 mg Intravenous New Bag/Given 08/10/22 1628)  insulin aspart (novoLOG) injection 0-15 Units (3 Units Subcutaneous Given  08/10/22 1631)  morphine (PF) 2 MG/ML injection 2 mg (2 mg Intravenous Given 08/10/22 1428)  acetaminophen (TYLENOL) tablet 650 mg (has  no administration in time range)  pantoprazole (PROTONIX) injection 40 mg (40 mg Intravenous Given 08/10/22 1632)  oxyCODONE-acetaminophen (PERCOCET/ROXICET) 5-325 MG per tablet 1 tablet (has no administration in time range)  ipratropium-albuterol (DUONEB) 0.5-2.5 (3) MG/3ML nebulizer solution 3 mL (3 mLs Nebulization Given 08/10/22 1625)  lactated ringers bolus 3,000 mL (0 mLs Intravenous Stopped 08/10/22 0959)  ceFEPIme (MAXIPIME) 2 g in sodium chloride 0.9 % 100 mL IVPB (0 g Intravenous Stopped 08/10/22 0906)  metroNIDAZOLE (FLAGYL) IVPB 500 mg (0 mg Intravenous Stopped 08/10/22 1036)  vancomycin (VANCOREADY) IVPB 1500 mg/300 mL (0 mg Intravenous Stopped 08/10/22 1236)  acetaminophen (OFIRMEV) IV 1,000 mg (0 mg Intravenous Stopped 08/10/22 0943)  sodium chloride 0.9 % bolus 500 mL (0 mLs Intravenous Stopped 08/10/22 1143)  morphine (PF) 2 MG/ML injection 2 mg (2 mg Intravenous Given 08/10/22 1154)  calcium gluconate 1 g/ 50 mL sodium chloride IVPB (0 mg Intravenous Stopped 08/10/22 1253)  magnesium sulfate IVPB 2 g 50 mL (0 g Intravenous Stopped 08/10/22 1810)    ED Course/ Medical Decision Making/ A&P Clinical Course as of 08/10/22 1828  Mon Aug 10, 2022  0914 Hemoglobin(!): 8.3 Baseline of 11 [RP]  [phone] James Coffey (daughter) has confirmed full code.  [RP]  Bieber from IV team states that port can Coffey accessed.  She is looking into whether or not pressures can Coffey given through it. [RP]  1132 Emphysematous cystitis with pyelonephritis.  [RP]  1146 Called by radiology.  CT scan shows emphysematous cystitis with mild hydroureter nephrosis bilaterally and pyelonephritis.  Also shows possible fistula connection with the bladder. [RP]  0000000 James Coffey [RP]  [id number] Discussed with Dr. Lovena Neighbours from urology who recommends placing Foley catheter but states that patient does not need  additional acute urologic intervention.  Also recommends reaching out to general surgery about possible fistula. [RP]  22 James Coffey from general surgery to evaluate the patient. [RP]    Clinical Course User Index [RP] Fransico Meadow, MD                             Medical Decision Making Amount and/or Complexity of Data Reviewed Labs: ordered. Decision-making details documented in ED Course. Radiology: ordered.  Risk Prescription drug management. Decision regarding hospitalization.   Fountain Liceaga is a 52 y.o. male with comorbidities that complicate the patient evaluation including metastatic colon cancer with recent craniectomy who presents to the emergency department with fever and dysuria  Initial Ddx:  Urosepsis, intra-abdominal abscess, craniectomy complication, pneumonia  MDM:  Feel that given the patient's symptoms he may have pyelonephritis or urinary tract infection is causing severe sepsis.  He was very ill appearing on exam and will bolus the patient with fluids and consider pressors if he does not improve after fluids and antibiotics.  Will send sepsis labs and blood cultures at this time.  Plan:  Labs Blood cultures Urinalysis IV fluids CT head CT chest, abdomen, pelvis with IV contrast IV antibiotics  ED Summary/Re-evaluation:  Patient reevaluated did not have significant improvement of his blood pressure after IV fluids.  While awaiting port access he was started on peripheral Levophed and vasopressin.  His port was accessed and patient was started on these medications through his port.  Also given stress dose steroids.  He had improvement of his hemodynamics and CT scan showed emphysematous cystitis with possible fistula.  Discussed with urology who felt the patient should have a Foley catheter  placed and will Coffey admitted and did not require any acute intervention.  General surgery was also consulted given the concerns for fistula who will evaluate patient  shortly.  Patient then admitted to critical care and critical condition.  Did verify full code with the patient's daughter at the time of admission.  This patient presents to the ED for concern of complaints listed in HPI, this involves an extensive number of treatment options, and is a complaint that carries with it a high risk of complications and morbidity. Disposition including potential need for admission considered.   Dispo: ICU  Additional history obtained from daughter Records reviewed Outpatient Clinic Notes The following labs were independently interpreted: Urinalysis and show urinary tract infection I independently reviewed the following imaging with scope of interpretation limited to determining acute life threatening conditions related to emergency care: CT Head, CT Chest, and CT Abdomen/Pelvis and agree with the radiologist interpretation with the following exceptions: None I personally reviewed and interpreted cardiac monitoring: sinus tachycardia I personally reviewed and interpreted the pt's EKG: see above for interpretation  I have reviewed the patients home medications and made adjustments as needed Consults: Critical care, General Surgery, and Urology Social Determinants of health:  Bosnian speaking  Final Clinical Impression(s) / ED Diagnoses Final diagnoses:  Emphysematous cystitis  Pyelonephritis  Septic shock ([place])  Non-English speaking patient    Rx / DC Orders ED Discharge Orders     None      CRITICAL CARE Performed by: Fransico Meadow   Total critical care time: 90 minutes  Critical care time was exclusive of separately billable procedures and treating other patients.  Critical care was necessary to treat or prevent imminent or life-threatening deterioration.  Critical care was time spent personally by me on the following activities: development of treatment plan with patient and/or surrogate as well as nursing, discussions with consultants,  evaluation of patient's response to treatment, examination of patient, obtaining history from patient or surrogate, ordering and performing treatments and interventions, ordering and review of laboratory studies, ordering and review of radiographic studies, pulse oximetry and re-evaluation of patient's condition.    Fransico Meadow, MD 08/10/22 [phone]

## 2022-08-10 NOTE — Consult Note (Signed)
Consultation Note Date: 08/10/2022   Patient Name: James Coffey  DOB: 1970-07-20  MRN: [id number]  Age / Sex: 52 y.o., male  PCP: Patient, No Pcp Per Referring Physician: Freddi Starr, MD  Reason for Consultation: Establishing goals of care, "metasttic cancer with septic shock "  HPI/Patient Profile: 52 y.o. 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 Patient was admitted on 08/10/2022 with septic shock requiring pressor support, acute respiratory failure, AKI, thrombocytopenia, hyponatremia, encephalopathy.  Of note, patient has had 4 admissions and 2 ED visits in the last 6 months. This is a 7 day readmission.  Clinical Assessment and Goals of Care: I have reviewed medical records including EPIC notes, labs, and imaging. Received report from primary RN - no acute concerns.   Utilized ipad interpreter for entirety of visit: [employer] [phone]  Went to visit patient at bedside - wife/Adelisa and two daughters present. Patient was lying in bed awake, alert, oriented, and able to participate in conversation though he seems unable to understand more complex medical decisions discussed today. No signs or non-verbal gestures of pain or discomfort noted. No respiratory distress or increased work of breathing. Patient endorses feeling discomfort in his chest. He is on acute 3L O2 [place]. Congested cough noted.  Met with patient and family  to discuss diagnosis, prognosis, GOC, EOL wishes, disposition, and options.  I introduced Palliative Medicine as specialized medical care for people living with serious illness. It focuses on providing relief from the symptoms and stress of a  serious illness. The goal is to improve quality of life for both the patient and the family.  We discussed a brief life review of the patient as well as functional and nutritional status. Patient is married with three daughters. Prior to hospitalization, he was living in a private residence with his wife and daughters. After craniotomy and tumor resection on 08/05/22, he returned home where he has been doing well - was functionally independent; however, family tell me they first noticed his decline about two days ago. Patient began having fevers and poor appetite due to nausea. Patient is not currently receiving chemo or radiation.  We discussed patient's current illness and what it means in the larger context of patient's on-going co-morbidities. Family and patient did not seem to have a clear understanding of his current acute medical situation - provided medical updates and reviewed hospital course since admission. We reviewed patient's current acute medical conditions in context of his cancer in detail. Natural disease trajectory and expectations at EOL were discussed. I attempted to elicit values and goals of care important to the patient. The difference between aggressive medical intervention and comfort care was considered in light of the patient's goals of care.   Detailed and lengthy discussion was had around code status. Encouraged patient/family to consider DNR/DNI status understanding evidenced based poor outcomes in similar hospitalized patient, as the cause of arrest is likely associated with advanced chronic/terminal illness rather than an easily reversible acute cardio-pulmonary event.  I shared that even if we pursued resuscitation we would not able to resolve the underlying factors. I explained that DNR/DNI does not change the medical plan and it only comes into effect after a person has arrested (died).  It is a  protective measure to keep [place] from harming the patient in their last moments of  life. Patient did not seem to understand the complex medical decision making around this topic as he stated, "what thoughts would I have about this," "I want another option," "that isn't the last option," as well as "I want to live much longer." Again reviewed patient's frailty and concern for decline. Discussed that I also hope he can live much longer; however, it is important to prepare for events in case he does decline. He just stated "I don't know." I reviewed his current full code status and poor chance he would be able to successfully wean from ventilator; reviewed quality of life in context of a resuscitation event. Medical recommendation given for DNR/DNI and a peaceful, natural passing. At this time, patient/family were not agreeable to DNR/DNI with understanding that he would receive CPR, defibrillation, ACLS medications, or intubation.   Daughter called SIL/Irfa who is a Marine scientist - reviewed and discussed information as outlined above.   Goals at this time remain full code/full scope.   Discussed with patient/family the importance of continued conversation with each other and the medical providers regarding overall plan of care and treatment options, ensuring decisions are within the context of the patient's values and GOCs.    Questions and concerns were addressed. The patient/family was encouraged to call with questions and/or concerns. PMT card was provided.   Primary Decision Maker: PATIENT with assistance from his family    SUMMARY OF RECOMMENDATIONS   Continue full code/full scope care Ongoing GOC pending clinical course PMT will continue to follow and support holistically   Code Status/Advance Care Planning: Full code  Palliative Prophylaxis:  Aspiration, Frequent Pain Assessment, and Turn Reposition  Additional Recommendations (Limitations, Scope, Preferences): Full Scope Treatment  Psycho-social/Spiritual:  Desire for further Chaplaincy support:no Created space and  opportunity for patient and family to express thoughts and feelings regarding patient's current medical situation.  Emotional support and therapeutic listening provided.  Prognosis:  Unable to determine  Discharge Planning: To Be Determined      Primary Diagnoses: Present on Admission:  Sepsis ([place])   I have reviewed the medical record, interviewed the patient and family, and examined the patient. The following aspects are pertinent.  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  Came to the [place] in 2000.   Social Determinants of Health   Financial Resource Strain: Not on file  Food Insecurity: No Food Insecurity (07/23/2022)   Hunger Vital Sign    Worried About Running Out of Food in the Last Year: Never true    Ran Out of  Food in the Last Year: Never true  Transportation Needs: No Transportation Needs (07/23/2022)   PRAPARE - Hydrologist (Medical): No    Lack of Transportation (Non-Medical): No  Physical Activity: Not on file  Stress: Not on file  Social Connections: Not on file   Family History  Problem Relation Age of Onset   CAD Neg Hx    Inflammatory bowel disease Neg Hx    Scheduled Meds:  albuterol  2.5 mg Nebulization Q4H   hydrocortisone sodium  succinate  50 mg Intravenous Q6H   insulin aspart  0-15 Units Subcutaneous Q4H   pantoprazole (PROTONIX) IV  40 mg Intravenous Q24H   vancomycin variable dose per unstable renal function (pharmacist dosing)   Does not apply See admin instructions   Continuous Infusions:  sodium chloride Stopped (08/10/22 1418)   sodium chloride 250 mL (08/10/22 1417)   albumin human 50 g (08/10/22 1434)   lactated ringers 100 mL/hr at 08/10/22 1415   levETIRAcetam     meropenem (MERREM) IV 1 g (08/10/22 1440)   norepinephrine (LEVOPHED) Adult infusion 6 mcg/min (08/10/22 1441)   vasopressin 0.03 Units/min (08/10/22 0914)   PRN Meds:.acetaminophen, albuterol, docusate sodium, morphine injection, ondansetron (ZOFRAN) IV, oxyCODONE-acetaminophen, polyethylene glycol Medications Prior to Admission:  Prior to Admission medications   Medication Sig Start Date End Date Taking? Authorizing Provider  acetaminophen (TYLENOL) 500 MG tablet Take 2 tablets (1,000 mg total) by mouth every 8 (eight) hours as needed for mild pain or fever. 12/19/20  Yes Barkley Boards R, PA-C  acetaminophen-codeine (TYLENOL #3) 300-30 MG tablet Take 1 tablet by mouth every 6 (six) hours as needed for up to 7 days for moderate pain. 08/07/22 08/14/22 Yes [name], MD  cyanocobalamin (VITAMIN B12) 1000 MCG tablet Take 1 tablet (1,000 mcg total) by mouth daily. 07/25/22  Yes Florencia Reasons, MD  dexamethasone (DECADRON) 4 MG tablet Take 1 tablet (4 mg total) by mouth 2 (two) times daily for 7 days. 08/07/22 08/14/22 Yes [name], MD  levETIRAcetam (KEPPRA) 500 MG tablet Take 1 tablet (500 mg total) by mouth 2 (two) times daily. 08/07/22  Yes [name], MD  LORazepam (ATIVAN) 0.5 MG tablet Take 1 tablet (0.5 mg total) by mouth at bedtime. Patient taking differently: Take 0.5 mg by mouth 3 (three) times daily as needed for anxiety or sleep. 07/20/22  Yes [name], MD  oxyCODONE (OXY IR/ROXICODONE) 5 MG immediate release tablet Take 1  tablet (5 mg total) by mouth every 6 (six) hours as needed for moderate pain or severe pain. Patient taking differently: Take 5 mg by mouth 3 (three) times daily as needed for moderate pain or severe pain. 07/20/22  Yes [name], MD  pantoprazole (PROTONIX) 40 MG tablet Take 1 tablet (40 mg total) by mouth daily. 07/25/22 08/24/22 Yes Florencia Reasons, MD  polyethylene glycol (MIRALAX) 17 g packet Take 17 g by mouth daily. Take daily to maintain regular soft colostomy output. Can increase to twice a day if need. Patient taking differently: Take 17 g by mouth daily as needed for mild constipation. 06/24/22  Yes Winferd Humphrey, PA-C  prochlorperazine (COMPAZINE) 10 MG tablet Take 1 tablet (10 mg total) by mouth every 6 (six) hours as needed for nausea or vomiting. Patient taking differently: Take 10 mg by mouth 3 (three) times daily as needed for nausea or vomiting. 07/20/22  Yes [name], MD  tamsulosin (FLOMAX) 0.4 MG CAPS capsule Take  1 capsule (0.4 mg total) by mouth daily after supper. 07/25/22  Yes Florencia Reasons, MD  senna-docusate (SENNA S) 8.6-50 MG tablet Take 2 tablets by mouth at bedtime. Patient not taking: Reported on 07/22/2022 07/20/22   [name], MD   Allergies  Allergen Reactions   Hydrocodone Itching   Pork-Derived Products Other (See Comments)    Do not eat pork   Review of Systems  Constitutional:  Positive for activity change, appetite change and fatigue.  Respiratory:  Negative for shortness of breath.   Gastrointestinal:  Positive for nausea.  Neurological:  Positive for weakness.  All other systems reviewed and are negative.   Physical Exam Vitals and nursing note reviewed.  Constitutional:      General: He is not in acute distress.    Appearance: He is ill-appearing.  Pulmonary:     Effort: No respiratory distress.  Skin:    General: Skin is warm and dry.  Neurological:     Mental Status: He is alert and oriented to person, place, and time.      Motor: Weakness present.  Psychiatric:        Attention and Perception: Attention normal.        Behavior: Behavior is cooperative.        Cognition and Memory: Cognition and memory normal.     Vital Signs: BP 115/73   Pulse 90   Temp 98.1 F (36.7 C) (Oral)   Resp (!) 25   Ht '5\' 9"'$  (1.753 m)   Wt 76.1 kg   SpO2 91%   BMI 24.78 kg/m  Pain Scale: 0-10   Pain Score: 7    SpO2: SpO2: 91 % O2 Device:SpO2: 91 % O2 Flow Rate: .O2 Flow Rate (L/min): 3 L/min  IO: Intake/output summary:  Intake/Output Summary (Last 24 hours) at 08/10/2022 1450 Last data filed at 08/10/2022 1341 Gross per 24 hour  Intake 4150 ml  Output 1450 ml  Net 2700 ml    LBM: Last BM Date : 08/10/22 Baseline Weight: Weight: 77.1 kg Most recent weight: Weight: 76.1 kg     Palliative Assessment/Data: PPS 10% - NPO currently     Time In: [id number] Time Out: 1645 Time Total: 120 minutes  Greater than 50%  of this time was spent counseling and coordinating care related to the above assessment and plan.  Signed by: Lin Landsman, NP   Please contact Palliative Medicine Team phone at [phone] for questions and concerns.  For individual provider: See Amion  *Portions of this note are a verbal dictation therefore any spelling and/or grammatical errors are due to the "[employer]" system interpretation.

## 2022-08-10 NOTE — Progress Notes (Signed)
Pharmacy Antibiotic Note  James Coffey is a 52 y.o. male admitted on 08/10/2022 with sepsis and recent brain tumor resection on 2/28.  Pharmacy has been consulted for Vancomycin and cefepime dosing.  CrCl currently 30 mL/min - patient is in AKI with Scr 2.7 and baseline <1  Plan: Vancomycin '1500mg'$  IV once - subsequent doses based on renal function trend - Would target trough 15-20 mcg/mL if concerned for meningitis - With current renal function, he would qualify for Q24H dosing. Pharmacy to re-evaluate prior for repeat dosing assessment Cefepime 2g Q12H  Height: '5\' 9"'$  (175.3 cm) Weight: 77.1 kg (170 lb) IBW/kg (Calculated) : 70.7  Temp (24hrs), Avg:102.6 F (39.2 C), Min:102.6 F (39.2 C), Max:102.6 F (39.2 C)  Recent Labs  Lab 08/03/22 1329  WBC 21.0*  CREATININE 0.77    Estimated Creatinine Clearance: 106.8 mL/min (by C-G formula based on SCr of 0.77 mg/dL).    Allergies  Allergen Reactions   Hydrocodone Itching   Pork-Derived Products Other (See Comments)    Do not eat pork    Thank you for allowing pharmacy to be a part of this patient's care.  Merrilee Jansky, PharmD Clinical Pharmacist 08/10/2022 8:29 AM

## 2022-08-10 NOTE — Progress Notes (Signed)
Pharmacy Antibiotic Note  James Coffey is a 52 y.o. male admitted on 08/10/2022 with sepsis and recent brain tumor resection on 2/28.  Pharmacy has been consulted for meropenem dosing.  CrCl currently 30 mL/min - patient is in AKI with Scr 2.7 and baseline <1  Plan: Vancomycin plan as previously outlined in pharmacy note (1500 mg load then repeat dosing per level unless renal function proves stable) CCM is requesting change from cefepime to meropenem Will start meropenem 1g q12h Monitor renal function, clinical course, and cultures De-escalate when appropriate  Height: '5\' 9"'$  (175.3 cm) Weight: 77.1 kg (170 lb) IBW/kg (Calculated) : 70.7  Temp (24hrs), Avg:100.9 F (38.3 C), Min:99.2 F (37.3 C), Max:102.6 F (39.2 C)  Recent Labs  Lab 08/10/22 0809 08/10/22 0853 08/10/22 1007  WBC 4.1  --   --   CREATININE 2.77* 2.70*  --   LATICACIDVEN 8.6*  --  5.3*     Estimated Creatinine Clearance: 31.6 mL/min (A) (by C-G formula based on SCr of 2.7 mg/dL (H)).    Allergies  Allergen Reactions   Hydrocodone Itching   Pork-Derived Products Other (See Comments)    Do not eat pork    Thank you for allowing pharmacy to be a part of this patient's care.  Heloise Purpura, PharmD Clinical Pharmacist 08/10/2022 1:37 PM

## 2022-08-10 NOTE — Progress Notes (Signed)
[facility] Progress Note Patient Name: James Coffey DOB: [birth year] MRN: [id number]   Date of Service  08/10/2022  HPI/Events of Note  Discussed with pharmacy, the patient has positive blood cultures 4 out of 4 with E. coli pansensitive.  Currently covered with vancomycin and meropenem.  Nasal MRSA negative.  eICU Interventions  Given low likelihood of MRSA urinary tract infection and negative nasal MRSA, can discontinue vancomycin.  Alternative respiratory bacteria still possible, maintain meropenem until respiratory cultures finalized otherwise can de-escalate to cephalosporin per day team preference.   0430 -platelets dropped to 16 K this morning.  No evidence of active bleeding.  Transfuse if platelets less than 10 K or 20 K without signs of bleeding.  Will repeat CBC at noon.  No chemoprophylaxis ordered.  0544 - persistent cough with chest pain; added guaifenesin-codeine    Intervention Category Minor Interventions: Routine modifications to care plan (e.g. PRN medications for pain, fever)  Correne Lalani 08/10/2022, 9:46 PM

## 2022-08-10 NOTE — H&P (Signed)
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 He  presents emergency department from home on 08/10/2022 with fever X 3 days.  His complex medical history is significant for diverting colostomy in the setting of fournier's gangrene s/p multiple debridements in 2022, followed by diagnosis of rectal cancer with liver metastasis s/p partial left hepatectomy in March 2023. He then underwent percutaneous ablation of a right liver tumor in early May as well as abdominal perineal resection, robotic approach 02/04/22 . He completed radiation therapy June 2023. He has a pre-sacral fluid collection that has been drained by IR on multiple occasions.  Patient had Foley catheter that was placed and was removed several days ago ( during craniotomy procedure)  and reports that he has been having dysuria and fevers for the past 3 days. Family treated his dysuria with Azo, which may explain the orange red color of his urine. He denies any specific locations of pain but says that he is having pain all over. Per EMS was hypotensive and tachycardic and was given 1 L of fluids prior to arrival. Patient alert and oriented to self only and unable to provide additional history.  Recent Craniotomy with tumor resection 08/05/2022 for suspected metastatic disease.  In the ED patient was given 30 cc per KG fluid resuscitation. Blood Cx were drawn Resp. Viral panel was done and negative for Covid and flu. He was started on Levophed  and vasopressin for hypotension. Initial lactate was 8.6, and at last check is 5.3. 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  Urine and Blood Cx are pending  Additional labs in the ED revealed Na 129/ BUN 47/ Creatinine 2.77/ Calcium of 7.5/ Albumin of 1.5/ AST 61/ ALT 47/ Alk Phos of 262/ Total Bili 2.2 HGB 8.3/ WBC 4.1 ( was 21 last week) , platelets 46.000, PT 19. INR 1.6 UA positive for Nitrites, small leukocytes, large HGB In the ED he received Cefapime, Flagyl, and Vancomycin. 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   pt denies   Cancer ([place])    rectal cancer with mets to liver   Tobacco abuse    Colon cancer with mets to the brain, lung in addition to the  liver  Craniotomy with brain tumor resection 08/05/2022 Significant Hospital Events: Including procedures, antibiotic start and stop dates in addition to other pertinent events   2/28 Craniotomy 3/1 Discharge from [facility] after crani 3/5 Admission to [facility] with Sepsis  Interim History / Subjective:  Currently on Levophed at 15 mcg/min Vasopressin at 0.003 units per minute Oriented to self, answers simple questions  Lactate trending down   Patients physicians: GI: Sadie Haber  GU: [facility]  Imaging  CT chest Abdomen Pelvis 08/10/2022  Gas in the urinary bladder wall with perivesicular stranding and intraluminal gas in the bladder, 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 2. Mild bilateral hydroureteral nephrosis to the level of the urinary bladder with perinephric and periureteric stranding, suggestive of ascending urinary tract  infection. 3. Similar appearance of the fluid and gas collection in the presacral/mesorectal space with possible fistulous connection with the urinary bladder. Consider further evaluation with nonemergent CT cystogram. 4. New patchy bilateral ground-glass opacities, which are nonspecific but favored to reflect an infectious or inflammatory etiology. 5. Similar surgical changes of left lower quadrant end colostomy with large peristomal hernia containing fat and nonobstructed loops of bowel. 6. Indexed hepatic lesions are similar to slightly increased in size in comparison to prior, but evaluation is limited by lack of intravenous contrast material, attention on follow-up contrasted examination suggested. 7. Increased size of the lingular pulmonary nodule at least somewhat enlarged secondary to new associated atelectasis, attention on follow-up imaging suggested. Stable right upper lobe pulmonary nodule. 8. Aortic Atherosclerosis (ICD10-I70.0) and Emphysema (ICD10-J43.9). Objective   Blood pressure 117/76, pulse (!) 102, temperature 99.2 F (37.3 C), temperature source Rectal, resp. rate (!) 27, height '5\' 9"'$  (1.753 m), weight 77.1 kg, SpO2 96 %.        Intake/Output Summary (Last 24 hours) at 08/10/2022 1216 Last data filed at 08/10/2022 1143 Gross per 24 hour  Intake 3800 ml  Output 250 ml  Net 3550 ml   Filed Weights   08/10/22 0758  Weight: 77.1 kg    Examination: General: Critically ill appearing male, Awake and oriented to self, able to amswer simple questions  HENT:  L craniotomy staples  intact, minimal drainage noted, No LAD, No JVD, chemo port R chest Lungs:  Bilateral chest excursion , wheezing with rhonchi , diminished per bases Cardiovascular:  S1, S2, RRR, No MRG, ST per tele Abdomen:  Slightly distended and tender to shallow palpation, colonoscopy with stool noted, pink stoma. Extremities:  Warm to touch , no obvious deformities Neuro:  Eyes are open , follows  simple commands , MAE x 4, alert to self only GU: Foley cath with pink urine draining in adequate amounts  Resolved Hospital Problem list     Assessment & Plan:  Sepsis with hypotension , suspected urinary tract source Potential for Pulmonary source as contributor >>  New patchy bilateral ground-glass opacities noted on CT chest 3/4 Lactate 5.3 from 8.6, WBC 4.1 (was 21 last week)  Plan Continue Levophed to maintain MAP > 65 Wean as able  Continue Vasopressin at 0.03 units Continue Vancomycin and Meropenem Continue Solucortef as ordered Follow Micro Blood Cx and Urine Cx Narrow ABX when able Trend PCT Albumin now as ordered Trend lactate Trend WBC and fever curve CXR prn  LR at 100/ hr Consider Echo  Urology consulted/  interventions as recommended to control source  Bilateral patchy GG opacities on CXR Acute respiratory failure, now requiring oxygen  Current every day smoker  Lung mets Plan CXR in am and prn Titrate oxygen to maintain sats> 90% BD scheduled and prn for shortness of breath or wheezing ABX as above  Additional Cultures as are clinically indicated  Urine for legionella and Strep  AKI in setting of sepsis/ hypotension/ suspected Urinary  Source  Plan Trend BMET Maintain renal perfusion  Avoid nephrotoxic medications  Monitor urine output Trend and Replete electrolytes as needed  Thrombocytopenia most likely 2/2 sepsis PLTS 45K  on admission  Plan Trend CBC Monitor for bleeding  Transfuse platelets as needed  Hyponatremia Plan  Trend BMET Restrict free water  Transition to Saline as maintenance IVF when able  Status post Craniotomy with tumor resection per Dr. Larose Hires 2/28 Plan Keppra 500 mg BID IV Transition to PO once able to tolerate PO's  Seizure precautions  Monitor L crani suture line for any drainage ,swelling  Encephalopathic 2/2 sepsis Plan Frequent re-orientation  Lights on during the day, off at night  Check ammonia level  prn  Pain  Metastatic Cancer  Plan Oxy with morphine  as needed for pain Trend LFT's   Goals of Care in setting of metastatic Cancer  Plan Palliative team have been consulted ED physician discussed with Daughter and Wife on admission Need continued discussions and re-evaluation    Best Practice (right click and "Reselect all SmartList Selections" daily)   Diet/type:  NPO except meds DVT prophylaxis: SCD GI prophylaxis: PPI Lines: Central line>> port Foley:  Yes, and it is still needed Code Status:  full code Last date of multidisciplinary goals of care discussion [08/10/2022 in ED by ED physician ] Ongoing  Labs   CBC: Recent Labs  Lab 08/03/22 1329 08/05/22 1445 08/10/22 0809 08/10/22 0853  WBC 21.0*  --  4.1  --   NEUTROABS  --   --  3.0  --   HGB 12.3* 11.6* 8.3* 7.5*  HCT 38.7* 34.0* 25.2* 22.0*  MCV 89.0  --  86.9  --   PLT 270  --  46*  --     Basic Metabolic Panel: Recent Labs  Lab 08/03/22 1329 08/05/22 1445 08/10/22 0809 08/10/22 0853  NA 133* 142 129* 131*  K 4.1 3.8 4.5 4.5  CL 101  --  98 99  CO2 22  --  17*  --   GLUCOSE 278*  --  132* 129*  BUN 19  --  47* 37*  CREATININE 0.77  --  2.77* 2.70*  CALCIUM 8.5*  --  7.5*  --    GFR: Estimated Creatinine Clearance: 31.6 mL/min (A) (by C-G formula based on SCr of 2.7 mg/dL (H)). Recent Labs  Lab 08/03/22 1329 08/10/22 0809 08/10/22 1007  WBC 21.0* 4.1  --   LATICACIDVEN  --  8.6* 5.3*    Liver Function Tests: Recent Labs  Lab 08/03/22 1329 08/10/22 0809  AST 25 61*  ALT 49* 47*  ALKPHOS 133* 262*  BILITOT 1.0 2.2*  PROT 6.1* 4.5*  ALBUMIN 2.7* 1.5*   No results for input(s): "LIPASE", "AMYLASE" in the last 168 hours. No results for input(s): "AMMONIA" in the last 168 hours.  ABG    Component Value Date/Time   PHART 7.485 (H) 08/05/2022 1445   PCO2ART 33.9 08/05/2022 1445   PO2ART 297 (H) 08/05/2022 1445   HCO3 25.5 08/05/2022 1445   TCO2 18 (L) 08/10/2022 0853    ACIDBASEDEF 0.3 07/22/2022 0031   O2SAT 100 08/05/2022 1445     Coagulation Profile: Recent Labs  Lab 08/10/22 0809  INR 1.6*    Cardiac Enzymes: No results for input(s): "CKTOTAL", "CKMB", "CKMBINDEX", "TROPONINI" in the last 168 hours.  HbA1C: Hgb A1c MFr Bld  Date/Time Value Ref Range Status  03/16/2022 08:49 AM 5.5 4.8 - 5.6 % Final    Comment:    (NOTE) Pre diabetes:          5.7%-6.4%  Diabetes:              >  6.4%  Glycemic control for   <7.0% adults with diabetes   11/04/2020 03:30 AM 5.6 4.8 - 5.6 % Final    Comment:    (NOTE)         Prediabetes: 5.7 - 6.4         Diabetes: >6.4         Glycemic control for adults with diabetes: <7.0     CBG: No results for input(s): "GLUCAP" in the last 168 hours.  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   Surgical History:   Past Surgical History:  Procedure Laterality Date   APPLICATION OF CRANIAL NAVIGATION N/A 08/05/2022   Procedure: APPLICATION OF CRANIAL NAVIGATION;  Surgeon: [name], MD;  Location: [place];  Service: Neurosurgery;  Laterality: N/A;   CRANIOTOMY Left 08/05/2022   Procedure: Left frontal craniotomy for resection of tumor with Stealth;  Surgeon: [name], MD;  Location: [place];  Service: Neurosurgery;  Laterality: Left;  RM 19 to follow   [place] N/A 12/01/2020   Procedure: INCISION AND DRAINAGE ABSCESS;  Surgeon: Michael Boston, MD;  Location: [facility];  Service: General;  Laterality: N/A;   INCISION AND DRAINAGE ABSCESS N/A 12/04/2020   Procedure: INCISION AND DRAINAGE FOURNIERS GANGRENE;  Surgeon: [name], MD;  Location: [facility];  Service: General;  Laterality: N/A;   INCISION AND DRAINAGE PERIRECTAL ABSCESS N/A 11/30/2020   Procedure: INCISION AND DEBRIDEMENT OF PERIRECTAL  ABSCESS AND RECTAL WALL BIOPSIES;  Surgeon: Michael Boston, MD;  Location: [facility];  Service: General;  Laterality: N/A;   INGUINAL HERNIA REPAIR Right 2007   pt denies   IR IMAGING GUIDED PORT INSERTION  05/07/2021   IR RADIOLOGIST EVAL & MGMT  08/27/2021   IR RADIOLOGIST EVAL & MGMT  09/12/2021   IR RADIOLOGIST EVAL & MGMT  11/10/2021   IR RADIOLOGIST EVAL & MGMT  01/05/2022   IR SINUS/FIST TUBE CHK-NON GI  04/28/2022   LAPAROSCOPIC LOOP COLOSTOMY N/A 12/13/2020   Procedure: DIAGNOSTIC LAPAROSCOPY WITH  OPEN DESCENDING LOOP COLOSTOMY;  Surgeon: Armandina Gemma, MD;  Location: [facility];  Service: General;  Laterality: N/A;   LAPAROSCOPY N/A 08/28/2021   Procedure: LAPAROSCOPY DIAGNOSTIC;  Surgeon: Dwan Bolt, MD;  Location: [place];  Service: General;  Laterality: N/A;   OPEN PARTIAL HEPATECTOMY  Left 08/28/2021   Procedure: OPEN LEFT LATERAL HEPATECTOMY WITH INTRAOPERATIVE ULTRASOUND;  Surgeon: Dwan Bolt, MD;  Location: [place];  Service: General;  Laterality: Left;   RADIOLOGY WITH ANESTHESIA N/A 10/08/2021   Procedure: MICROWAVE ABLATION;  Surgeon: Aletta Edouard, MD;  Location[facility];  Service: Radiology;  Laterality: N/A;   RECTAL SURGERY     XI ROBOTIC ASSISTED PARASTOMAL HERNIA REPAIR N/A 02/04/2022   Procedure: ROBOTIC ABDOMINAL PERINEAL RESECTION, PARASTOMAL HERNIA REPAIR, BILATERAL TAP BLOCK;  Surgeon: [name], MD;  Location: [facility];  Service: General;  Laterality: N/A;     Social History:   reports that he has been smoking cigarettes. He has been smoking an average of .25 packs per day. He has never used smokeless tobacco. He reports that he does not drink alcohol and does not use drugs.   Family History:  His family history is negative for CAD and Inflammatory bowel disease.   Allergies Allergies  Allergen  Reactions   Hydrocodone Itching   Pork-Derived Products Other (See Comments)    Do not eat pork     Home Medications  Prior to Admission medications    Medication Sig Start Date End Date Taking? Authorizing Provider  acetaminophen (TYLENOL) 500 MG tablet Take 2 tablets (1,000 mg total) by mouth every 8 (eight) hours as needed for mild pain or fever. 12/19/20  Yes Barkley Boards R, PA-C  acetaminophen-codeine (TYLENOL #3) 300-30 MG tablet Take 1 tablet by mouth every 6 (six) hours as needed for up to 7 days for moderate pain. 08/07/22 08/14/22 Yes [name], MD  cyanocobalamin (VITAMIN B12) 1000 MCG tablet Take 1 tablet (1,000 mcg total) by mouth daily. 07/25/22  Yes Florencia Reasons, MD  dexamethasone (DECADRON) 4 MG tablet Take 1 tablet (4 mg total) by mouth 2 (two) times daily for 7 days. 08/07/22 08/14/22 Yes [name], MD  levETIRAcetam (KEPPRA) 500 MG tablet Take 1 tablet (500 mg total) by mouth 2 (two) times daily. 08/07/22  Yes [name], MD  LORazepam (ATIVAN) 0.5 MG tablet Take 1 tablet (0.5 mg total) by mouth at bedtime. Patient taking differently: Take 0.5 mg by mouth 3 (three) times daily as needed for anxiety or sleep. 07/20/22  Yes [name], MD  oxyCODONE (OXY IR/ROXICODONE) 5 MG immediate release tablet Take 1 tablet (5 mg total) by mouth every 6 (six) hours as needed for moderate pain or severe pain. Patient taking differently: Take 5 mg by mouth 3 (three) times daily as needed for moderate pain or severe pain. 07/20/22  Yes [name], MD  pantoprazole (PROTONIX) 40 MG tablet Take 1 tablet (40 mg total) by mouth daily. 07/25/22 08/24/22 Yes Florencia Reasons, MD  polyethylene glycol (MIRALAX) 17 g packet Take 17 g by mouth daily. Take daily to maintain regular soft colostomy output. Can increase to twice a day if need. Patient taking differently: Take 17 g by mouth daily as needed for mild constipation. 06/24/22  Yes Winferd Humphrey, PA-C  prochlorperazine (COMPAZINE) 10 MG tablet Take 1 tablet (10 mg total) by mouth every 6 (six) hours as needed for nausea or vomiting. Patient taking differently: Take 10 mg by mouth 3 (three)  times daily as needed for nausea or vomiting. 07/20/22  Yes [name], MD  tamsulosin (FLOMAX) 0.4 MG CAPS capsule Take 1 capsule (0.4 mg total) by mouth daily after supper. 07/25/22  Yes Florencia Reasons, MD  senna-docusate (SENNA S) 8.6-50 MG tablet Take 2 tablets by mouth at bedtime. Patient not taking: Reported on 07/22/2022 07/20/22   [name], MD     Critical care time: 23 minutes   Magdalen Spatz, MSN, AGACNP-BC [place] for personal pager PCCM on call pager [phone]  08/10/2022 08/10/2022

## 2022-08-10 NOTE — Progress Notes (Signed)
ED MD paged IVT; requesting ETA on [facility] access as patient is acutely ill. Informed Dr. Philip Aspen that ED is able to access [facility] and that pressors can go thru the [facility]. Stated that our team would respond to consult ASAP however if he is this acutely ill, would consider dropping a central line versus delaying care. No other questions/concerns at this time.  Kenadee Gates Lorita Officer, RN

## 2022-08-10 NOTE — ED Triage Notes (Signed)
Pt brought in by EMS from home; pt had brain surgery, scheduled, 6 days ago and has now had fevers x3 days. Pt had a foley catheter after surgery and since it was discontinued (unclear when exactly) he has been having dysuria and issues voiding. Pt reporting pain all over  BP 70/40's en route with HR 130's and hot to the touch

## 2022-08-10 NOTE — Progress Notes (Signed)
PHARMACY - PHYSICIAN COMMUNICATION CRITICAL VALUE ALERT - BLOOD CULTURE IDENTIFICATION (BCID)  James Coffey is an 52 y.o. male who presented to [facility] on 08/10/2022 with a chief complaint of fever and chest/abdominal pain  Assessment:  4/4 blood cultures positive for GNR - E.coli - no resistance possible urinary tract or pulmonary source  Name of physician (or Provider) Contacted: Dr. Dorian Pod  Current antibiotics: Meropenem and Vancomycin  Changes to prescribed antibiotics recommended:  Decision made with overnight provider to d/c vancomycin and defer to day team regarding further de-escalation of meropenem  Results for orders placed or performed during the hospital encounter of 11/29/20  Blood Culture ID Panel (Reflexed) (Collected: 11/29/2020 11:42 PM)  Result Value Ref Range   Enterococcus faecalis NOT DETECTED NOT DETECTED   Enterococcus Faecium NOT DETECTED NOT DETECTED   Listeria monocytogenes NOT DETECTED NOT DETECTED   Staphylococcus species NOT DETECTED NOT DETECTED   Staphylococcus aureus (BCID) NOT DETECTED NOT DETECTED   Staphylococcus epidermidis NOT DETECTED NOT DETECTED   Staphylococcus lugdunensis NOT DETECTED NOT DETECTED   Streptococcus species NOT DETECTED NOT DETECTED   Streptococcus agalactiae NOT DETECTED NOT DETECTED   Streptococcus pneumoniae NOT DETECTED NOT DETECTED   Streptococcus pyogenes NOT DETECTED NOT DETECTED   A.calcoaceticus-baumannii NOT DETECTED NOT DETECTED   Bacteroides fragilis NOT DETECTED NOT DETECTED   Enterobacterales NOT DETECTED NOT DETECTED   Enterobacter cloacae complex NOT DETECTED NOT DETECTED   Escherichia coli NOT DETECTED NOT DETECTED   Klebsiella aerogenes NOT DETECTED NOT DETECTED   Klebsiella oxytoca NOT DETECTED NOT DETECTED   Klebsiella pneumoniae NOT DETECTED NOT DETECTED   Proteus species NOT DETECTED NOT DETECTED   Salmonella species NOT DETECTED NOT DETECTED   Serratia marcescens NOT DETECTED NOT DETECTED    Haemophilus influenzae NOT DETECTED NOT DETECTED   Neisseria meningitidis NOT DETECTED NOT DETECTED   Pseudomonas aeruginosa NOT DETECTED NOT DETECTED   Stenotrophomonas maltophilia NOT DETECTED NOT DETECTED   Candida albicans NOT DETECTED NOT DETECTED   Candida auris NOT DETECTED NOT DETECTED   Candida glabrata NOT DETECTED NOT DETECTED   Candida krusei NOT DETECTED NOT DETECTED   Candida parapsilosis NOT DETECTED NOT DETECTED   Candida tropicalis NOT DETECTED NOT DETECTED   Cryptococcus neoformans/gattii NOT DETECTED NOT DETECTED    Corinda Gubler 08/10/2022  9:39 PM

## 2022-08-11 ENCOUNTER — Inpatient Hospital Stay (HOSPITAL_COMMUNITY): Payer: Medicaid Other

## 2022-08-11 DIAGNOSIS — N12 Tubulo-interstitial nephritis, not specified as acute or chronic: Secondary | ICD-10-CM

## 2022-08-11 DIAGNOSIS — B9620 Unspecified Escherichia coli [E. coli] as the cause of diseases classified elsewhere: Secondary | ICD-10-CM | POA: Diagnosis not present

## 2022-08-11 DIAGNOSIS — N3080 Other cystitis without hematuria: Secondary | ICD-10-CM | POA: Diagnosis not present

## 2022-08-11 DIAGNOSIS — Z789 Other specified health status: Secondary | ICD-10-CM | POA: Diagnosis not present

## 2022-08-11 DIAGNOSIS — R6521 Severe sepsis with septic shock: Secondary | ICD-10-CM | POA: Diagnosis not present

## 2022-08-11 DIAGNOSIS — N179 Acute kidney failure, unspecified: Secondary | ICD-10-CM | POA: Diagnosis not present

## 2022-08-11 DIAGNOSIS — A419 Sepsis, unspecified organism: Secondary | ICD-10-CM | POA: Diagnosis not present

## 2022-08-11 LAB — COMPREHENSIVE METABOLIC PANEL
ALT: 340 U/L — ABNORMAL HIGH (ref 0–44)
AST: 472 U/L — ABNORMAL HIGH (ref 15–41)
Albumin: 3.3 g/dL — ABNORMAL LOW (ref 3.5–5.0)
Alkaline Phosphatase: 109 U/L (ref 38–126)
Anion gap: 15 (ref 5–15)
BUN: 35 mg/dL — ABNORMAL HIGH (ref 6–20)
CO2: 19 mmol/L — ABNORMAL LOW (ref 22–32)
Calcium: 8.5 mg/dL — ABNORMAL LOW (ref 8.9–10.3)
Chloride: 102 mmol/L (ref 98–111)
Creatinine, Ser: 1.56 mg/dL — ABNORMAL HIGH (ref 0.61–1.24)
GFR, Estimated: 53 mL/min — ABNORMAL LOW (ref >60)
Glucose, Bld: 150 mg/dL — ABNORMAL HIGH (ref 70–99)
Potassium: 3.5 mmol/L (ref 3.5–5.1)
Sodium: 136 mmol/L (ref 135–145)
Total Bilirubin: 3.5 mg/dL — ABNORMAL HIGH (ref 0.3–1.2)
Total Protein: 5.8 g/dL — ABNORMAL LOW (ref 6.5–8.1)

## 2022-08-11 LAB — CBC
HCT: 21.3 % — ABNORMAL LOW (ref 39.0–52.0)
HCT: 22.7 % — ABNORMAL LOW (ref 39.0–52.0)
Hemoglobin: 7.2 g/dL — ABNORMAL LOW (ref 13.0–17.0)
Hemoglobin: 7.4 g/dL — ABNORMAL LOW (ref 13.0–17.0)
MCH: 28.7 pg (ref 26.0–34.0)
MCH: 28.1 pg (ref 26.0–34.0)
MCHC: 33.8 g/dL (ref 30.0–36.0)
MCHC: 32.6 g/dL (ref 30.0–36.0)
MCV: 84.9 fL (ref 80.0–100.0)
MCV: 86.3 fL (ref 80.0–100.0)
Platelets: 16 10*3/uL — CL (ref 150–400)
Platelets: 21 10*3/uL — CL (ref 150–400)
RBC: 2.51 MIL/uL — ABNORMAL LOW (ref 4.22–5.81)
RBC: 2.63 MIL/uL — ABNORMAL LOW (ref 4.22–5.81)
RDW: 22.2 % — ABNORMAL HIGH (ref 11.5–15.5)
RDW: 22.5 % — ABNORMAL HIGH (ref 11.5–15.5)
WBC: 10.5 10*3/uL (ref 4.0–10.5)
WBC: 9.3 10*3/uL (ref 4.0–10.5)
nRBC: 0 % (ref 0.0–0.2)
nRBC: 0 % (ref 0.0–0.2)

## 2022-08-11 LAB — GLUCOSE, CAPILLARY
Glucose-Capillary: 147 mg/dL — ABNORMAL HIGH (ref 70–99)
Glucose-Capillary: 108 mg/dL — ABNORMAL HIGH (ref 70–99)
Glucose-Capillary: 130 mg/dL — ABNORMAL HIGH (ref 70–99)
Glucose-Capillary: 133 mg/dL — ABNORMAL HIGH (ref 70–99)
Glucose-Capillary: 152 mg/dL — ABNORMAL HIGH (ref 70–99)
Glucose-Capillary: 151 mg/dL — ABNORMAL HIGH (ref 70–99)

## 2022-08-11 LAB — AMMONIA: Ammonia: 29 umol/L (ref 9–35)

## 2022-08-11 LAB — TYPE AND SCREEN
ABO/RH(D): A NEG
Antibody Screen: NEGATIVE

## 2022-08-11 LAB — MAGNESIUM: Magnesium: 3.1 mg/dL — ABNORMAL HIGH (ref 1.7–2.4)

## 2022-08-11 LAB — PROCALCITONIN: Procalcitonin: 75.91 ng/mL

## 2022-08-11 LAB — LACTIC ACID, PLASMA: Lactic Acid, Venous: 4.5 mmol/L (ref 0.5–1.9)

## 2022-08-11 LAB — PHOSPHORUS: Phosphorus: 4.9 mg/dL — ABNORMAL HIGH (ref 2.5–4.6)

## 2022-08-11 MED ORDER — HYDROCORTISONE SOD SUC (PF) 100 MG IJ SOLR: 100.0000 mg | Freq: Two times a day (BID) | INTRAMUSCULAR | Status: DC

## 2022-08-11 MED ORDER — HYDROMORPHONE HCL 1 MG/ML IJ SOLN
0.5000 mg | INTRAMUSCULAR | Status: DC | PRN
  Administered 2022-08-11 – 2022-08-19 (×10): 0.5 mg via INTRAVENOUS
  Filled 2022-08-11 (×10): qty 0.5

## 2022-08-11 MED ORDER — GUAIFENESIN-CODEINE 100-10 MG/5ML PO SOLN
5.0000 mL | ORAL | Status: DC | PRN
  Administered 2022-08-11 – 2022-08-15 (×10): 5 mL via ORAL
  Filled 2022-08-11 (×11): qty 5

## 2022-08-11 MED ORDER — SODIUM CHLORIDE 0.9 % IV SOLN
1.0000 g | Freq: Three times a day (TID) | INTRAVENOUS | Status: DC
  Administered 2022-08-11 – 2022-08-12 (×2): 1 g via INTRAVENOUS
  Filled 2022-08-11 (×2): qty 20

## 2022-08-11 MED ORDER — DEXAMETHASONE 4 MG PO TABS
4.0000 mg | ORAL_TABLET | Freq: Two times a day (BID) | ORAL | Status: AC
  Administered 2022-08-11 – 2022-08-14 (×7): 4 mg via ORAL
  Filled 2022-08-11 (×7): qty 1

## 2022-08-11 NOTE — Progress Notes (Signed)
PHARMACY NOTE:  ANTIMICROBIAL RENAL DOSAGE ADJUSTMENT  Current antimicrobial regimen includes a mismatch between antimicrobial dosage and estimated renal function.  As per policy approved by the Pharmacy & Therapeutics and Medical Executive Committees, the antimicrobial dosage will be adjusted accordingly.  Current antimicrobial dosage:  Meropenem 1g Q12H   Indication: E.Coli bacteremia   Renal Function:  Estimated Creatinine Clearance: 54.8 mL/min (A) (by C-G formula based on SCr of 1.56 mg/dL (H)).  Antimicrobial dosage has been changed to:  Meropenem 1g Q8H   Additional comments: AKI is resolving    Thank you for allowing pharmacy to be a part of this patient's care.  Esmeralda Arthur, PharmD, BCCCP  08/11/2022 2:42 PM

## 2022-08-11 NOTE — Progress Notes (Signed)
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 He  presents emergency department from home on 08/10/2022 with fever X 3 days.  His complex medical history is significant for diverting colostomy in the setting of fournier's gangrene s/p multiple debridements in 2022, followed by diagnosis of rectal cancer with liver metastasis s/p partial left hepatectomy in March 2023. He then underwent percutaneous ablation of a right liver tumor in early May as well as abdominal perineal resection, robotic approach 02/04/22 . He completed radiation therapy June 2023. He has a pre-sacral fluid collection that has been drained by IR on multiple occasions.  Patient had Foley catheter that was placed and was removed several days ago ( during craniotomy procedure)  and reports that he has been having dysuria and fevers for the past 3 days. Family treated his dysuria with Azo, which may explain the orange red color of his urine. He denies any specific locations of pain but says that he is having pain all over. Per EMS was hypotensive and tachycardic and was given 1 L of fluids prior to arrival. Patient alert and oriented to self only and unable to provide additional history.  Recent Craniotomy with tumor resection 08/05/2022 for suspected metastatic disease.  In the ED patient was given 30 cc per KG fluid resuscitation. Blood Cx were drawn Resp. Viral panel was done and negative for Covid and flu. He was started on Levophed  and vasopressin for hypotension. Initial lactate was 8.6, and at last check is 5.3. CT Chest Abdomen and pelvis results  are noted in full below, findings concerning for emphysematous cystitis, ascending urinary tract infection, possible fistulous connection with the urinary bladder, New patchy bilateral ground-glass opacities, favored to reflect an infectious or inflammatory etiology, increase in size of both hepatic lesions and pulmonary nodules.  Urine and Blood Cx are pending  Additional labs in the ED revealed Na 129/ BUN 47/ Creatinine 2.77/ Calcium of 7.5/ Albumin of 1.5/ AST 61/ ALT 47/ Alk Phos of 262/ Total Bili 2.2 HGB 8.3/ WBC 4.1 ( was 21 last week) , platelets 46.000, PT 19. INR 1.6 UA positive for Nitrites, small leukocytes, large HGB In the ED he received Cefapime, Flagyl, and Vancomycin. T Max 102.6  PCCM have been asked to admit to ICU for sepsis, suspected urinary tract source.  Pertinent  Medical History  Aortic ectasia Tobacco abuse Colon cancer with mets to the brain, lung in addition to the  liver Craniotomy with brain tumor resection 08/05/2022  Significant Hospital Events: Including procedures, antibiotic start and stop dates in addition to other pertinent events   2/28 Craniotomy 3/1 Discharge from [facility] after crani 3/5 Admission to [facility] with Sepsis  Interim History / Subjective:  Pain in his abdomen is improved, about 3-4/10. He denies worse breathing than yesterday.   Objective   Blood pressure 111/81, pulse (!) 122, temperature 97.8 F (36.6 C), temperature source Oral, resp. rate (!) 23, height '5\' 9"'$  (1.753 m), weight 74.6 kg, SpO2 94 %.        Intake/Output Summary (Last 24 hours) at 08/11/2022 [id number] Last data filed at  08/11/2022 0600 Gross per 24 hour  Intake 8326.64 ml  Output 6425 ml  Net 1901.64 ml    Filed Weights   08/10/22 0758 08/10/22 1401 08/11/22 0403  Weight: 77.1 kg 76.1 kg 74.6 kg    Examination: General: chronically ill appearing man sitting up in bed in NAD  HENT:  Left craniotomy- has scabbing, no erythema or drainage. Lungs:  Rhales bilaterally,  tachypnea but no accessory muscle use. No conversational dyspnea.  Cardiovascular:  S1S2, tachycardic, regular rhythm Abdomen:  Ostomy with stool output, hernia. No TTP. Extremities:  No clubbing or cyanosis, no edema.  Neuro: awake, alert, moving all extremities. Answering questions appropriately.  GU: Ffoley  LA 4.5 BUN 35 Cr 1.56 AST 472 ALT 340 WBC 10.5 H/H 7.2/21.3 Platelets 16 CXR personally reviewed> L& R UL opacities, lingular opacity. RIJ port-a-cath.  Urine culture: pending Blood cultures: E coli 4/4   Resolved Hospital Problem list   hyponatremia  Assessment & Plan:  Septic shock due to E coli bacteremia and UTI with emphysematous cystitis, pyelonephritis. 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 They will see him this afternoon.  -Appreciate surgery's management-- may need IR intervention on the presacral space. -Stop LR -vasopressors to maintain MAP >65 -stress dose steroids  Acute respiratory failure with hypoxia- unsure if inflammatory condition due to bacteremia vs bacterial pneumonia Current every day smoker  Pulmonary mets from colon cancer; lingular lesion likely enlarging -supplemental O2 to maintain SpO2 >90% -d/c LR -will counsel on the importance of smoking cessation -defer to outpatient oncology what is needed for pulmonary nodule; not currently a chemo candidate due to infection  AKI due to septic shock -supportive care -ensure adequate perfusion -avoid nephrotoxic meds, renally dose meds -monitor I/O  Hyperglycemia -SSI PRN -goal BG 140-180  Thrombocytopenia likely 2/2 sepsis -transfuse for platelets <10; monitor -hold chemical DVT prophylaxis  Status-post Craniotomy with tumor resection per  Dr. Larose Hires 2/28 -keppra  -supportive care -seizure precautions  Acute metabolic encephalopathy 2/2 sepsis; improving -reorientation -supportive care -PT consult  Pain due to metastatic cancer  -morphine changed to dilaudid due to AKI -oxycodone PRN  Goals of Care   -Palliative team have been consulted -wife is surrogate decision maker    Best Practice (right click and "Reselect all SmartList Selections" daily)   Diet/type:  regular DVT prophylaxis: SCD GI prophylaxis: PPI Lines: Central line>> port Foley:  Yes, and it is still needed Code Status:  full code Last date of multidisciplinary goals of care discussion [08/10/2022 in ED by ED physician ] Ongoing  Labs   CBC: Recent Labs  Lab 08/05/22 1445 08/10/22 0809 08/10/22 0853 08/11/22 0336  WBC  --  4.1  --  10.5  NEUTROABS  --  3.0  --   --   HGB 11.6* 8.3* 7.5* 7.2*  HCT 34.0* 25.2* 22.0* 21.3*  MCV  --  86.9  --  84.9  PLT  --  46*  --  16*     Basic Metabolic Panel: Recent Labs  Lab 08/05/22 1445 08/10/22 0809 08/10/22 0853 08/10/22 2301 08/11/22 0336  NA 142 129* 131* 135 136  K 3.8 4.5 4.5 3.7 3.5  CL  --  98 99 103 102  CO2  --  17*  --  20* 19*  GLUCOSE  --  132* 129* 175* 150*  BUN  --  47* 37* 36* 35*  CREATININE  --  2.77* 2.70* 1.73* 1.56*  CALCIUM  --  7.5*  --  8.3* 8.5*  MG  --  1.5*  --  3.1* 3.1*  PHOS  --  3.0  --   --  4.9*    GFR: Estimated Creatinine Clearance: 54.8 mL/min (A) (by C-G formula based on SCr of 1.56 mg/dL (H)). Recent Labs  Lab 08/10/22 0809 08/10/22 1007 08/11/22 0336  PROCALCITON 130.49  --  75.91  WBC 4.1  --  10.5  LATICACIDVEN 8.6* 5.3* 4.5*     Liver Function Tests: Recent Labs  Lab 08/10/22 0809 08/11/22 0336  AST 61* 472*  ALT 47* 340*  ALKPHOS 262* 109  BILITOT 2.2* 3.5*  PROT 4.5* 5.8*  ALBUMIN 1.5* 3.3*    No results for input(s): "LIPASE", "AMYLASE" in the last 168 hours. Recent Labs  Lab 08/11/22 0336  AMMONIA 29     Critical care time:      This patient is critically ill with multiple organ system failure which requires frequent high complexity decision making, assessment, support, evaluation, and titration of therapies. This was completed through the application of advanced monitoring technologies and extensive interpretation of multiple databases. During this encounter critical care time was devoted to patient care services described in this note for 48 minutes.  Julian Hy, DO 08/11/22 9:52 AM [facility]  For contact information, see Amion. If no response to pager, please call PCCM consult pager. After hours, 7PM- 7AM, please call Elink.

## 2022-08-11 NOTE — Evaluation (Signed)
Physical Therapy Evaluation Patient Details Name: James Coffey MRN: [id number] DOB: [birth year] Today's Date: 08/11/2022  History of Present Illness  52 y.o. male who presents on 08/10/22 with fever x several days. Was admitted with septic shock, acute respiratory failure, AKI, encephalopathy. PMH includes rectal cancer with mets to liver (L lateral hepatectomy 08/28/21), lungs, and brain (L frontal craniotomy for tumor resection on 08/05/22), fournier gangrene, perianal abscess, loop colostomy.  Clinical Impression  Pt admitted with above diagnosis. Pt received in bed, currently on 4L O2 with O2 sats in 90's, HR in 120's. Pt required min A to mobilize to EOB and stand to pivot to chair. With each bout of mobility pt's HR increased into 140's and up to 151 bpm with O2 sats dropping to 80's. O2 increased to 5L with sats maintained low 90's. Mobility limited by tachycardia but will hopefully be able to progress next session. Pt reports he is home alone while family works. Recommending HHPT at this point but pt may refuse.  Pt currently with functional limitations due to the deficits listed below (see PT Problem List). Pt will benefit from skilled PT to increase their independence and safety with mobility to allow discharge to the venue listed below.          Recommendations for follow up therapy are one component of a multi-disciplinary discharge planning process, led by the attending physician.  Recommendations may be updated based on patient status, additional functional criteria and insurance authorization.  Follow Up Recommendations Home health PT      Assistance Recommended at Discharge Intermittent Supervision/Assistance  Patient can return home with the following  A little help with walking and/or transfers;A little help with bathing/dressing/bathroom;Assistance with cooking/housework    Equipment Recommendations Other (comment) (TBD)  Recommendations for Other Services       Functional  Status Assessment Patient has had a recent decline in their functional status and demonstrates the ability to make significant improvements in function in a reasonable and predictable amount of time.     Precautions / Restrictions Precautions Precautions: Fall Restrictions Weight Bearing Restrictions: No Other Position/Activity Restrictions: colostomy, recent crani      Mobility  Bed Mobility Overal bed mobility: Needs Assistance Bed Mobility: Supine to Sit     Supine to sit: Min assist     General bed mobility comments: min HHA with transition from SL to upright sitting    Transfers Overall transfer level: Needs assistance Equipment used: None Transfers: Sit to/from Stand, Bed to chair/wheelchair/BSC Sit to Stand: Min guard   Step pivot transfers: Min assist       General transfer comment: min guard for safety. HR up as high as 150 with standing. SPO2 dropped to 87% on 4L O2, O2 increased to 5L    Ambulation/Gait Ambulation/Gait assistance: Min assist Gait Distance (Feet): 3 Feet Assistive device: None Gait Pattern/deviations: Step-to pattern Gait velocity: decreased Gait velocity interpretation: <1.31 ft/sec, indicative of household ambulator   General Gait Details: limited by tachycardia, stepped along bedside several times but had pt sit and rest in between each bout due to HR in high 140's and SPO2 dropping to high to mid 80's.  Stairs            Wheelchair Mobility    Modified Rankin (Stroke Patients Only)       Balance Overall balance assessment: Mild deficits observed, not formally tested  Pertinent Vitals/Pain Pain Assessment Pain Assessment: Faces Faces Pain Scale: Hurts a little bit Pain Location: discomfort B thighs Pain Descriptors / Indicators: Grimacing, Guarding Pain Intervention(s): Limited activity within patient's tolerance, Monitored during session    Home Living  Family/patient expects to be discharged to:: Private residence Living Arrangements: Spouse/significant other;Children Available Help at Discharge: Family Type of Home: House Home Access: Level entry       Home Layout: One level Home Equipment: None Additional Comments: pt lives with his wife and daughters, they all work    Prior Function Prior Level of Function : Independent/Modified Independent               ADLs Comments: wife has been helping with bathing and dressing     Hand Dominance        Extremity/Trunk Assessment   Upper Extremity Assessment Upper Extremity Assessment: Generalized weakness    Lower Extremity Assessment Lower Extremity Assessment: Generalized weakness RLE Deficits / Details: hip 3/5, knee ext 4/5, ankle 4+/5 RLE Sensation: WNL LLE Deficits / Details: same as RLE LLE Sensation: WNL    Cervical / Trunk Assessment Cervical / Trunk Assessment: Normal  Communication   Communication: Prefers language other than English (Bosnian)  Cognition Arousal/Alertness: Awake/alert Behavior During Therapy: WFL for tasks assessed/performed, Agitated Overall Cognitive Status: Within Functional Limits for tasks assessed                                 General Comments: pt appropriate with conversation, wants to get back home        General Comments General comments (skin integrity, edema, etc.): pt able to converse during mobility, denies SOB despite O2 sats dropping. Asymptomatic with tachycardia    Exercises     Assessment/Plan    PT Assessment Patient needs continued PT services  PT Problem List Cardiopulmonary status limiting activity;Decreased activity tolerance;Decreased mobility;Decreased balance       PT Treatment Interventions DME instruction;Gait training;Stair training;Functional mobility training;Therapeutic activities;Therapeutic exercise;Balance training;Patient/family education    PT Goals (Current goals can be  found in the Care Plan section)  Acute Rehab PT Goals Patient Stated Goal: to go home PT Goal Formulation: With patient Time For Goal Achievement: 08/25/22 Potential to Achieve Goals: Good    Frequency Min 3X/week     Co-evaluation               AM-PAC PT "6 Clicks" Mobility  Outcome Measure Help needed turning from your back to your side while in a flat bed without using bedrails?: A Little Help needed moving from lying on your back to sitting on the side of a flat bed without using bedrails?: A Little Help needed moving to and from a bed to a chair (including a wheelchair)?: A Little Help needed standing up from a chair using your arms (e.g., wheelchair or bedside chair)?: A Little Help needed to walk in hospital room?: A Little Help needed climbing 3-5 steps with a railing? : A Lot 6 Click Score: 17    End of Session Equipment Utilized During Treatment: Oxygen Activity Tolerance: Treatment limited secondary to medical complications (Comment) (tachy) Patient left: in chair;with call bell/phone within reach Nurse Communication: Mobility status PT Visit Diagnosis: Muscle weakness (generalized) (M62.81);Difficulty in walking, not elsewhere classified (R26.2);Unsteadiness on feet (R26.81)    Time: 1534-1600 PT Time Calculation (min) (ACUTE ONLY): 26 min   Charges:   PT Evaluation $PT Eval Moderate Complexity:  1 Mod PT Treatments $Therapeutic Activity: 8-22 mins        Leighton Roach, PT  Acute Rehab Services Secure chat preferred Office [place] 08/11/2022, 4:24 PM

## 2022-08-11 NOTE — Progress Notes (Signed)
Daily Progress Note   Patient Name: James Coffey       Date: 08/11/2022 DOB: Mar 08, 1971  Age: 52 y.o. MRN#: [id number] Attending Physician: Julian Hy, DO Primary Care Physician: Patient, No Pcp Per Admit Date: 08/10/2022  Reason for Consultation/Follow-up: Establishing goals of care  Subjective: I have reviewed medical records including EPIC notes and labs. Received report from primary RN - no acute concerns. RN reports patient is now off both pressors, remains in a.fib with RVR.   Attempted to utilize interpreter for visit but patient spoke in [place] for responses - interpreter was available for duration of visit in case needed: Aretta Nip [id number]  Went to visit patient at bedside - no family/visitors present. Patient was lying in bed feeding himself lunch. No signs or non-verbal gestures of pain or discomfort noted. No respiratory distress, increased work of breathing, or secretions noted. Monitor shows a.fib with rates 120-140s. He is on 4L O2 [place].  Upon my entrance patient is agitated - he tells me "I want to live my life more," "do not tell me I'm going to die," "I am good!" Attempted to provide therapeutic presence - informed patient that we did not have to discuss information as we did yesterday; however, it would be important to continue discussions as having a clear understanding of his current situation is important. Patient again yells, "I want to live! I am not dead!" He no longer wishes for PMT to be involved as he does not want to discuss [place] - he only wants to "talk about living." His goals remain clear for full code/full scope. Patient does endorse breathing much better today. I offered to call his family to simply offer support, but he states "do not call my family."  Offered coffee or  ice water - he was accepting of coffee and expressed appreciation for bringing and mixing sugar and creamer for him. I provided my card and encouraged him or his family to call if they had questions or concerns I could assist with.   Notified Dr. Carlis Abbott that patient does not wish for PMT to return.   Length of Stay: 1  Current Medications: Scheduled Meds:   Chlorhexidine Gluconate Cloth  6 each Topical Daily   dexamethasone  4 mg Oral Q12H   insulin aspart  0-15 Units Subcutaneous Q4H  ipratropium-albuterol  3 mL Nebulization Q6H   pantoprazole (PROTONIX) IV  40 mg Intravenous Q24H    Continuous Infusions:  sodium chloride 10 mL/hr at 08/11/22 1200   levETIRAcetam 500 mg (08/11/22 1503)   meropenem (MERREM) IV     norepinephrine (LEVOPHED) Adult infusion 4 mcg/min (08/11/22 1200)    PRN Meds: acetaminophen, albuterol, docusate sodium, guaiFENesin-codeine, HYDROmorphone (DILAUDID) injection, ondansetron (ZOFRAN) IV, oxyCODONE-acetaminophen, polyethylene glycol  Physical Exam Vitals and nursing note reviewed.  Constitutional:      General: He is not in acute distress. Pulmonary:     Effort: No respiratory distress.  Skin:    General: Skin is warm and dry.  Neurological:     Mental Status: He is alert and oriented to person, place, and time.     Motor: Weakness present.  Psychiatric:        Attention and Perception: Attention normal.        Mood and Affect: Affect is angry.        Behavior: Behavior is agitated.        Cognition and Memory: Cognition and memory normal.             Vital Signs: BP 96/81   Pulse (!) 109   Temp (!) 97.4 F (36.3 C) (Axillary)   Resp (!) 37   Ht '5\' 9"'$  (1.753 m)   Wt 74.6 kg   SpO2 97%   BMI 24.29 kg/m  SpO2: SpO2: 97 % O2 Device: O2 Device: Nasal Cannula O2 Flow Rate: O2 Flow Rate (L/min): 4 L/min  Intake/output summary:  Intake/Output Summary (Last 24 hours) at 08/11/2022 1527 Last data filed at 08/11/2022 1511 Gross per 24 hour   Intake 3686.09 ml  Output 5850 ml  Net -2163.91 ml   LBM: Last BM Date : 08/10/22 Baseline Weight: Weight: 77.1 kg Most recent weight: Weight: 74.6 kg       Palliative Assessment/Data: PPS 50%      Patient Active Problem List   Diagnosis Date Noted   Status post craniotomy 08/05/2022   AKI (acute kidney injury) ([place]) 07/23/2022   Rectal cancer metastasized to liver ([place]) 07/23/2022   Metastasis to brain ([place]) 07/22/2022   AMS (altered mental status) 07/22/2022   Surgical wound, non healing 04/22/2022   Fascial dehiscence of Terrial Rhodes site 04/22/2022   Colostomy complication ([place]) [id number]   Aortic ectasia, 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[place]) 03/21/2022   Anemia 03/18/2022   Intra-abdominal abscess ([place]) 03/16/2022   Hyponatremia 03/16/2022   Mild protein malnutrition ([place]) 03/16/2022   Hyperglycemia 03/16/2022   Rectal cancer metastasized to lung ([place]) 02/04/2022   Colorectal carcinoma ([place]) 10/08/2021   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 ([place]) 05/07/2021   Counseling regarding advance care planning and goals of care 05/07/2021   Port-A-Cath in place 03/05/2021   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 Patient was  admitted on 08/10/2022 with septic shock requiring pressor support, acute respiratory failure, AKI, thrombocytopenia, hyponatremia, encephalopathy.   Of note, patient has had 4 admissions and 2 ED visits in the last 6 months. This is a 7 day readmission.  Assessment: Principal Problem:   Sepsis ([place])   Concern about end of life  Recommendations/Plan: Continue full code/full scope Patient no longer wishes for PMT to be involved in his care  Patient did not want PMT to call his family today PMT will follow peripherally as patient is at high risk for decline. If patient or family indicate changes to goals or would like for PMT re-involvement, please call the service directly  Goals of Care and Additional Recommendations: Limitations on Scope of Treatment: Full Scope Treatment  Code Status:    Code Status Orders  (From admission, onward)           Start     Ordered   08/10/22 1303  Full code  Continuous       Question:  By:  Answer:  Consent: discussion documented in EHR   08/10/22 1311           Code Status History     Date Active Date Inactive Code Status Order ID Comments User Context   08/10/2022 0918 08/10/2022 1311 Full Code [id number]  Fransico Meadow, MD ED   08/05/2022 1646 08/07/2022 2010 Full Code [id number]  [name], MD Inpatient   07/22/2022 0252 07/25/2022 1640 Full Code [id number]  Kayleen Memos, DO ED   03/16/2022 1200 03/22/2022 1823 Full Code [id number]  Reubin Milan, MD ED   02/04/2022 1555 02/08/2022 1548 Full Code [id number]  [name], MD Inpatient   08/28/2021 1526 08/31/2021 1614 Full Code [id number]  Dwan Bolt, MD Inpatient   11/30/2020 1154 12/19/2020 2009 Full Code [id number]  Michael Boston, MD Inpatient   11/03/2020 2140 11/05/2020 1932 Full Code [id number]  Toy Baker, MD Inpatient       Prognosis:  Poor  Discharge Planning: To Be Determined  Care plan was discussed with primary RN, patient, Dr. Carlis Abbott  Thank you for  allowing the Palliative Medicine Team to assist in the care of this patient.   Total Time 35 minutes Prolonged Time Billed  no       Greater than 50%  of this time was spent counseling and coordinating care related to the above assessment and plan.  Lin Landsman, NP  Please contact Palliative Medicine Team phone at [phone] for questions and concerns.   *Portions of this note are a verbal dictation therefore any spelling and/or grammatical errors are due to the "[place] [employer]" system interpretation.

## 2022-08-11 NOTE — Consult Note (Signed)
Urology Consult   Physician requesting consult: Dr. Carlis Abbott  Reason for consult: urosepsis  History of Present Illness: James Coffey is a 52 y.o. with complex prior h/o Fournier's gangrene 2022, colostomy and subsequent development of metastatic rectal Ca with lung, liver and brain mets (s/p recent craniotomy for tumor).  Patient presented to ED yesterday with findings compatible with urosepsis and pneumonia complaining of fever, cough, chest/abdominal pain.  Patient has had hemodynamic instability requiring pressor support.  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  Patient is also noted to have a similar appearance to previously noted fluid and gas collection in the presacral space question vesicle fistula.  Today the patient has required less pressure support after initiation of broad-spectrum antibiotics.  He denies a history of voiding or storage urinary symptoms, hematuria, UTIs, STDs, urolithiasis, GU malignancy/trauma/surgery.  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   pt denies   Cancer ([place])    rectal cancer with mets to liver   Tobacco abuse     Past Surgical History:  Procedure Laterality Date   APPLICATION OF CRANIAL NAVIGATION N/A 08/05/2022   Procedure: APPLICATION OF CRANIAL NAVIGATION;  Surgeon: [name], MD;  Location: [facility];  Service: Neurosurgery;  Laterality: N/A;   CRANIOTOMY Left 08/05/2022   Procedure: Left frontal craniotomy for resection of tumor with [place];  Surgeon: [name], MD;  Location: [facility];  Service: Neurosurgery;  Laterality: Left;  RM 19 to follow   [place] N/A 12/01/2020   Procedure: INCISION AND DRAINAGE ABSCESS;  Surgeon: Michael Boston, MD;  [facility];  Service: General;  Laterality: N/A;   INCISION AND DRAINAGE ABSCESS N/A 12/04/2020   Procedure: INCISION AND  DRAINAGE FOURNIERS GANGRENE;  Surgeon: [name], MD;  Location[facility];  Service: General;  Laterality: N/A;   INCISION AND DRAINAGE PERIRECTAL ABSCESS N/A 11/30/2020   Procedure: INCISION AND DEBRIDEMENT OF PERIRECTAL ABSCESS AND RECTAL WALL BIOPSIES;  Surgeon: Michael Boston, MD;  [facility];  Service: General;  Laterality: N/A;   INGUINAL HERNIA REPAIR Right 2007   pt denies   IR IMAGING GUIDED PORT INSERTION  05/07/2021   IR RADIOLOGIST EVAL & MGMT  08/27/2021   IR RADIOLOGIST EVAL & MGMT  09/12/2021   IR RADIOLOGIST EVAL & MGMT  11/10/2021   IR RADIOLOGIST EVAL & MGMT  01/05/2022   IR SINUS/FIST TUBE CHK-NON GI  04/28/2022   LAPAROSCOPIC LOOP COLOSTOMY N/A 12/13/2020   Procedure: DIAGNOSTIC LAPAROSCOPY WITH  OPEN DESCENDING LOOP COLOSTOMY;  Surgeon: Armandina Gemma, MD;  [facility];  Service: General;  Laterality: N/A;   LAPAROSCOPY N/A 08/28/2021   Procedure: LAPAROSCOPY DIAGNOSTIC;  Surgeon: Dwan Bolt, MD;  Location: [facility];  Service: General;  Laterality: N/A;   OPEN PARTIAL HEPATECTOMY  Left 08/28/2021   Procedure: OPEN LEFT LATERAL HEPATECTOMY WITH INTRAOPERATIVE ULTRASOUND;  Surgeon: Dwan Bolt, MD;  Location: [facility];  Service: General;  Laterality: Left;   RADIOLOGY WITH ANESTHESIA N/A 10/08/2021   Procedure: MICROWAVE ABLATION;  Surgeon: Aletta Edouard, MD;  [facility];  Service: Radiology;  Laterality: N/A;   RECTAL SURGERY     XI ROBOTIC ASSISTED PARASTOMAL HERNIA REPAIR N/A 02/04/2022   Procedure: ROBOTIC ABDOMINAL PERINEAL RESECTION, PARASTOMAL HERNIA REPAIR, BILATERAL TAP BLOCK;  Surgeon: [name], MD;  [facility];  Service: General;  Laterality: N/A;  Medications:  Home meds:    Scheduled Meds:  Chlorhexidine Gluconate Cloth  6 each Topical Daily   dexamethasone  4 mg Oral Q12H   insulin aspart  0-15 Units Subcutaneous Q4H   ipratropium-albuterol  3 mL Nebulization Q6H   pantoprazole (PROTONIX) IV  40 mg Intravenous  Q24H   Continuous Infusions:  sodium chloride 10 mL/hr at 08/11/22 1200   levETIRAcetam 500 mg (08/11/22 1503)   meropenem (MERREM) IV     norepinephrine (LEVOPHED) Adult infusion 4 mcg/min (08/11/22 1200)   PRN Meds:.acetaminophen, albuterol, docusate sodium, guaiFENesin-codeine, HYDROmorphone (DILAUDID) injection, ondansetron (ZOFRAN) IV, oxyCODONE-acetaminophen, polyethylene glycol  Allergies:  Allergies  Allergen Reactions   Hydrocodone Itching   Pork-Derived Products Other (See Comments)    Do not eat pork    Family History  Problem Relation Age of Onset   CAD Neg Hx    Inflammatory bowel disease Neg Hx     Social History:  reports that he has been smoking cigarettes. He has been smoking an average of .25 packs per day. He has never used smokeless tobacco. He reports that he does not drink alcohol and does not use drugs.  ROS: A complete review of systems was performed.  All systems are negative except for pertinent findings as noted.  Physical Exam:  Vital signs in last 24 hours: Temp:  [96.6 F (35.9 C)-98.2 F (36.8 C)] 97.4 F (36.3 C) (03/05 1506) Pulse Rate:  [85-129] 88 (03/05 1650) Resp:  [12-37] 25 (03/05 1650) BP: (82-132)/(58-98) 100/70 (03/05 1645) SpO2:  [88 %-99 %] 95 % (03/05 1645) Weight:  [74.6 kg] 74.6 kg (03/05 0403) Constitutional:  Alert and oriented, No acute distress Genitourinary: Foley catheter in place   Laboratory Data:  Recent Labs    08/10/22 0809 08/10/22 0853 08/11/22 0336 08/11/22 1216  WBC 4.1  --  10.5 9.3  HGB 8.3* 7.5* 7.2* 7.4*  HCT 25.2* 22.0* 21.3* 22.7*  PLT 46*  --  16* 21*    Recent Labs    08/10/22 0809 08/10/22 0853 08/10/22 2301 08/11/22 0336  NA 129* 131* 135 136  K 4.5 4.5 3.7 3.5  CL 98 99 103 102  GLUCOSE 132* 129* 175* 150*  BUN 47* 37* 36* 35*  CALCIUM 7.5*  --  8.3* 8.5*  CREATININE 2.77* 2.70* 1.73* 1.56*     Results for orders placed or performed during the hospital encounter of 08/10/22  (from the past 24 hour(s))  Glucose, capillary     Status: Abnormal   Collection Time: 08/10/22  7:46 PM  Result Value Ref Range   Glucose-Capillary 160 (H) 70 - 99 mg/dL  Basic metabolic panel     Status: Abnormal   Collection Time: 08/10/22 11:01 PM  Result Value Ref Range   Sodium 135 135 - 145 mmol/L   Potassium 3.7 3.5 - 5.1 mmol/L   Chloride 103 98 - 111 mmol/L   CO2 20 (L) 22 - 32 mmol/L   Glucose, Bld 175 (H) 70 - 99 mg/dL   BUN 36 (H) 6 - 20 mg/dL   Creatinine, Ser 1.73 (H) 0.61 - 1.24 mg/dL   Calcium 8.3 (L) 8.9 - 10.3 mg/dL   GFR, Estimated 47 (L) >60 mL/min   Anion gap 12 5 - 15  Magnesium     Status: Abnormal   Collection Time: 08/10/22 11:01 PM  Result Value Ref Range   Magnesium 3.1 (H) 1.7 - 2.4 mg/dL  Glucose, capillary     Status: Abnormal  Collection Time: 08/10/22 11:31 PM  Result Value Ref Range   Glucose-Capillary 162 (H) 70 - 99 mg/dL  CBC     Status: Abnormal   Collection Time: 08/11/22  3:36 AM  Result Value Ref Range   WBC 10.5 4.0 - 10.5 K/uL   RBC 2.51 (L) 4.22 - 5.81 MIL/uL   Hemoglobin 7.2 (L) 13.0 - 17.0 g/dL   HCT 21.3 (L) 39.0 - 52.0 %   MCV 84.9 80.0 - 100.0 fL   MCH 28.7 26.0 - 34.0 pg   MCHC 33.8 30.0 - 36.0 g/dL   RDW 22.2 (H) 11.5 - 15.5 %   Platelets 16 (LL) 150 - 400 K/uL   nRBC 0.0 0.0 - 0.2 %  Comprehensive metabolic panel     Status: Abnormal   Collection Time: 08/11/22  3:36 AM  Result Value Ref Range   Sodium 136 135 - 145 mmol/L   Potassium 3.5 3.5 - 5.1 mmol/L   Chloride 102 98 - 111 mmol/L   CO2 19 (L) 22 - 32 mmol/L   Glucose, Bld 150 (H) 70 - 99 mg/dL   BUN 35 (H) 6 - 20 mg/dL   Creatinine, Ser 1.56 (H) 0.61 - 1.24 mg/dL   Calcium 8.5 (L) 8.9 - 10.3 mg/dL   Total Protein 5.8 (L) 6.5 - 8.1 g/dL   Albumin 3.3 (L) 3.5 - 5.0 g/dL   AST 472 (H) 15 - 41 U/L   ALT 340 (H) 0 - 44 U/L   Alkaline Phosphatase 109 38 - 126 U/L   Total Bilirubin 3.5 (H) 0.3 - 1.2 mg/dL   GFR, Estimated 53 (L) >60 mL/min   Anion gap 15 5 -  15  Magnesium     Status: Abnormal   Collection Time: 08/11/22  3:36 AM  Result Value Ref Range   Magnesium 3.1 (H) 1.7 - 2.4 mg/dL  Phosphorus     Status: Abnormal   Collection Time: 08/11/22  3:36 AM  Result Value Ref Range   Phosphorus 4.9 (H) 2.5 - 4.6 mg/dL  Ammonia     Status: None   Collection Time: 08/11/22  3:36 AM  Result Value Ref Range   Ammonia 29 9 - 35 umol/L  Lactic acid, plasma     Status: Abnormal   Collection Time: 08/11/22  3:36 AM  Result Value Ref Range   Lactic Acid, Venous 4.5 (HH) 0.5 - 1.9 mmol/L  Procalcitonin     Status: None   Collection Time: 08/11/22  3:36 AM  Result Value Ref Range   Procalcitonin 75.91 ng/mL  Glucose, capillary     Status: Abnormal   Collection Time: 08/11/22  3:39 AM  Result Value Ref Range   Glucose-Capillary 147 (H) 70 - 99 mg/dL  Glucose, capillary     Status: Abnormal   Collection Time: 08/11/22  7:54 AM  Result Value Ref Range   Glucose-Capillary 108 (H) 70 - 99 mg/dL  Glucose, capillary     Status: Abnormal   Collection Time: 08/11/22 11:22 AM  Result Value Ref Range   Glucose-Capillary 130 (H) 70 - 99 mg/dL  Type and screen [place]     Status: None   Collection Time: 08/11/22 12:14 PM  Result Value Ref Range   ABO/RH(D) A NEG    Antibody Screen NEG    Sample Expiration      08/14/2022,2359 Performed at [facility], [address], [place], [place] [address]   CBC  Status: Abnormal   Collection Time: 08/11/22 12:16 PM  Result Value Ref Range   WBC 9.3 4.0 - 10.5 K/uL   RBC 2.63 (L) 4.22 - 5.81 MIL/uL   Hemoglobin 7.4 (L) 13.0 - 17.0 g/dL   HCT 22.7 (L) 39.0 - 52.0 %   MCV 86.3 80.0 - 100.0 fL   MCH 28.1 26.0 - 34.0 pg   MCHC 32.6 30.0 - 36.0 g/dL   RDW 22.5 (H) 11.5 - 15.5 %   Platelets 21 (LL) 150 - 400 K/uL   nRBC 0.0 0.0 - 0.2 %  Glucose, capillary     Status: Abnormal   Collection Time: 08/11/22  3:04 PM  Result Value Ref Range   Glucose-Capillary 133 (H) 70 - 99 mg/dL    Recent Results (from the past 240 hour(s))  MRSA Next Gen by PCR, Nasal     Status: Abnormal   Collection Time: 08/05/22  5:21 PM   Specimen: Nasal Mucosa; Nasal Swab  Result Value Ref Range Status   MRSA by PCR Next Gen DETECTED (A) NOT DETECTED Final    Comment: RESULT CALLED TO, READ BACK BY AND VERIFIED WITH: K JONES,RN'@2236'$  08/05/22 [place] (NOTE) The GeneXpert MRSA Assay (FDA approved for NASAL specimens only), is one component of a comprehensive MRSA colonization surveillance program. It is not intended to diagnose MRSA infection nor to guide or monitor treatment for MRSA infections. Test performance is not FDA approved in patients less than 61 years old. Performed at [facility], [place] [address], [place], [place] [address]   Culture, blood (Routine x 2)     Status: Abnormal (Preliminary result)   Collection Time: 08/10/22  8:14 AM   Specimen: BLOOD RIGHT HAND  Result Value Ref Range Status   Specimen Description BLOOD RIGHT HAND  Final   Special Requests   Final    BOTTLES DRAWN AEROBIC AND ANAEROBIC Blood Culture adequate volume   Culture  Setup Time   Final    GRAM NEGATIVE RODS IN BOTH AEROBIC AND ANAEROBIC BOTTLES CRITICAL RESULT CALLED TO, READ BACK BY AND VERIFIED WITH: Levada Dy [id number] '@2137'$  FH Performed at [facility], [place] [address], [place], [place] [address]    Culture ESCHERICHIA COLI (A)  Final   Report Status PENDING  Incomplete  Culture, blood (Routine x 2)     Status: Abnormal (Preliminary result)   Collection Time: 08/10/22  8:24 AM   Specimen: BLOOD  Result Value Ref Range Status   Specimen Description BLOOD RIGHT ANTECUBITAL  Final   Special Requests   Final    BOTTLES DRAWN AEROBIC AND ANAEROBIC Blood Culture adequate volume   Culture  Setup Time   Final    GRAM NEGATIVE RODS IN BOTH AEROBIC AND ANAEROBIC BOTTLES CRITICAL RESULT CALLED TO, READ BACK BY AND VERIFIED WITH: PHARMD C. Levada Dy [id number] '@2137'$  FH    Culture (A)  Final     ESCHERICHIA COLI CULTURE REINCUBATED FOR BETTER GROWTH Performed at [facility], [place] [address], [place],  [address]    Report Status PENDING  Incomplete  Blood Culture ID Panel (Reflexed)     Status: Abnormal   Collection Time: 08/10/22  8:24 AM  Result Value Ref Range Status   Enterococcus faecalis NOT DETECTED NOT DETECTED Final   Enterococcus Faecium NOT DETECTED NOT DETECTED Final   Listeria monocytogenes NOT DETECTED NOT DETECTED Final   Staphylococcus species NOT DETECTED NOT DETECTED Final   Staphylococcus aureus (BCID) NOT DETECTED NOT  DETECTED Final   Staphylococcus epidermidis NOT DETECTED NOT DETECTED Final   Staphylococcus lugdunensis NOT DETECTED NOT DETECTED Final   Streptococcus species NOT DETECTED NOT DETECTED Final   Streptococcus agalactiae NOT DETECTED NOT DETECTED Final   Streptococcus pneumoniae NOT DETECTED NOT DETECTED Final   Streptococcus pyogenes NOT DETECTED NOT DETECTED Final   A.calcoaceticus-baumannii NOT DETECTED NOT DETECTED Final   Bacteroides fragilis NOT DETECTED NOT DETECTED Final   Enterobacterales DETECTED (A) NOT DETECTED Final    Comment: CRITICAL RESULT CALLED TO, READ BACK BY AND VERIFIED WITH: PHARMD C. PIERCE [id number] '@2137'$  FH    Enterobacter cloacae complex NOT DETECTED NOT DETECTED Final   Escherichia coli DETECTED (A) NOT DETECTED Final    Comment: CRITICAL RESULT CALLED TO, READ BACK BY AND VERIFIED WITH: PHARMD C. PIERCE [id number] '@2137'$  FH    Klebsiella aerogenes NOT DETECTED NOT DETECTED Final   Klebsiella oxytoca NOT DETECTED NOT DETECTED Final   Klebsiella pneumoniae NOT DETECTED NOT DETECTED Final   Proteus species NOT DETECTED NOT DETECTED Final   Salmonella species NOT DETECTED NOT DETECTED Final   Serratia marcescens NOT DETECTED NOT DETECTED Final   Haemophilus influenzae NOT DETECTED NOT DETECTED Final   Neisseria meningitidis NOT DETECTED NOT DETECTED Final   Pseudomonas aeruginosa NOT DETECTED NOT DETECTED  Final   Stenotrophomonas maltophilia NOT DETECTED NOT DETECTED Final   Candida albicans NOT DETECTED NOT DETECTED Final   Candida auris NOT DETECTED NOT DETECTED Final   Candida glabrata NOT DETECTED NOT DETECTED Final   Candida krusei NOT DETECTED NOT DETECTED Final   Candida parapsilosis NOT DETECTED NOT DETECTED Final   Candida tropicalis NOT DETECTED NOT DETECTED Final   Cryptococcus neoformans/gattii NOT DETECTED NOT DETECTED Final   CTX-M ESBL NOT DETECTED NOT DETECTED Final   Carbapenem resistance IMP NOT DETECTED NOT DETECTED Final   Carbapenem resistance KPC NOT DETECTED NOT DETECTED Final   Carbapenem resistance NDM NOT DETECTED NOT DETECTED Final   Carbapenem resist OXA 48 LIKE NOT DETECTED NOT DETECTED Final   Carbapenem resistance VIM NOT DETECTED NOT DETECTED Final    Comment: Performed at [facility], [address] [place], [place] [address]  Resp panel by RT-PCR (RSV, Flu A&B, Covid) Anterior Nasal Swab     Status: None   Collection Time: 08/10/22  8:25 AM   Specimen: Anterior Nasal Swab  Result Value Ref Range Status   SARS Coronavirus 2 by RT PCR NEGATIVE NEGATIVE Final   Influenza A by PCR NEGATIVE NEGATIVE Final   Influenza B by PCR NEGATIVE NEGATIVE Final    Comment: (NOTE) The Xpert Xpress SARS-CoV-2/FLU/RSV plus assay is intended as an aid in the diagnosis of influenza from Nasopharyngeal swab specimens and should not be used as a sole basis for treatment. Nasal washings and aspirates are unacceptable for Xpert Xpress SARS-CoV-2/FLU/RSV testing.  Fact Sheet for Patients: EntrepreneurPulse.com.au  Fact Sheet for Healthcare Providers: IncredibleEmployment.be  This test is not yet approved or cleared by the [place] FDA and has been authorized for detection and/or diagnosis of SARS-CoV-2 by FDA under an Emergency Use Authorization (EUA). This EUA will remain in effect (meaning this test can be used) for the  duration of the COVID-19 declaration under Section 564(b)(1) of the Act, 21 U.S.C. section 360bbb-3(b)(1), unless the authorization is terminated or revoked.     Resp Syncytial Virus by PCR NEGATIVE NEGATIVE Final    Comment: (NOTE) Fact Sheet for Patients: EntrepreneurPulse.com.au  Fact Sheet for Healthcare Providers: IncredibleEmployment.be  This test is not yet approved or cleared by the [place] and has been authorized for detection and/or diagnosis of SARS-CoV-2 by FDA under an Emergency Use Authorization (EUA). This EUA will remain in effect (meaning this test can be used) for the duration of the COVID-19 declaration under Section 564(b)(1) of the Act, 21 U.S.C. section 360bbb-3(b)(1), unless the authorization is terminated or revoked.  Performed at [facility], [place] [address] [place], [place] [address]   Urine Culture (for pregnant, neutropenic or urologic patients or patients with an indwelling urinary catheter)     Status: Abnormal (Preliminary result)   Collection Time: 08/10/22  1:36 PM   Specimen: Urine, Catheterized  Result Value Ref Range Status   Specimen Description URINE, CATHETERIZED  Final   Special Requests NONE  Final   Culture (A)  Final    50,000 COLONIES/mL ESCHERICHIA COLI SUSCEPTIBILITIES TO FOLLOW CULTURE REINCUBATED FOR BETTER GROWTH Performed at [place] [address], [place], [place] [address]    Report Status PENDING  Incomplete  MRSA Next Gen by PCR, Nasal     Status: Abnormal   Collection Time: 08/10/22  5:54 PM   Specimen: Nasal Mucosa; Nasal Swab  Result Value Ref Range Status   MRSA by PCR Next Gen DETECTED (A) NOT DETECTED Final    Comment: RESULT CALLED TO, READ BACK BY AND VERIFIED WITH: RN LESLIE SIMPSON ON 08/10/22 @ 1946 BY DRT (NOTE) The GeneXpert MRSA Assay (FDA approved for NASAL specimens only), is one component of a comprehensive MRSA colonization  surveillance program. It is not intended to diagnose MRSA infection nor to guide or monitor treatment for MRSA infections. Test performance is not FDA approved in patients less than 93 years old. Performed at [facility], [place] [address], [place], [place] [address]     Renal Function: Recent Labs    08/10/22 0809 08/10/22 0853 08/10/22 2301 08/11/22 0336  CREATININE 2.77* 2.70* 1.73* 1.56*   Estimated Creatinine Clearance: 54.8 mL/min (A) (by C-G formula based on SCr of 1.56 mg/dL (H)).  Radiologic Imaging: DG Chest Port 1 View  Result Date: 08/11/2022 CLINICAL DATA:  52 year old male with history of pulmonary infiltrates. EXAM: PORTABLE CHEST 1 VIEW COMPARISON:  Chest x-ray 08/10/2022. FINDINGS: Right internal jugular single-lumen power porta cath with tip terminating in the superior cavoatrial junction. Widespread areas of interstitial prominence and patchy multifocal airspace consolidation scattered throughout the lungs bilaterally, most severe in the right mid lung and throughout the entirety of the left lung, particularly in the left upper lobe. No definite pleural effusions. No pneumothorax. Known lingular nodule partially obscured by airspace consolidation. Pulmonary vasculature appears engorged, but there is no evidence of pulmonary edema. Heart size is upper limits of normal. Upper mediastinal contours are within normal limits. IMPRESSION: 1. Progressive worsening asymmetric airspace consolidation throughout the lungs bilaterally, concerning for severe multilobar bilateral pneumonia. 2. Known lingular nodule now obscured by airspace consolidation. Electronically Signed   By: Vinnie Langton M.D.   On: 08/11/2022 05:55   CT CHEST ABDOMEN PELVIS WO CONTRAST  Result Date: 08/10/2022 CLINICAL DATA:  History of metastatic colon cancer and recurrent intra-abdominal abscesses with sepsis, right lung rhonchi. * Tracking Code: BO * EXAM: CT CHEST, ABDOMEN AND PELVIS WITHOUT CONTRAST  TECHNIQUE: Multidetector CT imaging of the chest, abdomen and pelvis was performed following the standard protocol without IV contrast. RADIATION DOSE REDUCTION: This exam was performed according to the departmental dose-optimization program which includes automated  exposure control, adjustment of the mA and/or kV according to patient size and/or use of iterative reconstruction technique. COMPARISON:  Multiple priors including CT July 13, 2022 and July 21, 2022. FINDINGS: Despite efforts by the technologist and patient, motion artifact is present on today's exam and could not be eliminated. This reduces exam sensitivity and specificity. CT CHEST FINDINGS Cardiovascular: Accessed right chest Port-A-Cath with tip at the superior cavoatrial junction. Gas in the brachiocephalic vein from vascular access. Aortic atherosclerosis. Normal size heart. No significant pericardial effusion/thickening. Mediastinum/Nodes: Suspicious thyroid nodule. No pathologically enlarged mediastinal, hilar or axillary lymph nodes, noting limited sensitivity for the detection of hilar adenopathy on this noncontrast study. The esophagus is grossly unremarkable. Lungs/Pleura: New patchy bilateral ground-glass opacities. Linear band of atelectasis associated with the lobular lingular pulmonary nodule now measuring 2.3 x 2.1 cm in total on image 94/5 previously 2.3 x 1.7 cm. Central right upper lobe pulmonary nodule measures 16 x 9 mm on image 48/5 previously 15 x 9 mm. Upper lobe pleuroparenchymal scarring. Musculoskeletal: No aggressive lytic or blastic lesion of bone. CT ABDOMEN PELVIS FINDINGS Hepatobiliary: Similar surgical changes of prior left hepatic lobe wedge resection. Ablation defect in the anterior right hepatic lobe appears similar. Indexed hepatic lesions are similar to slightly increased in size in comparison to prior, but evaluation is limited by lack of intravenous contrast material. For reference: -medial hepatic dome  lesion measures 8.3 x 5.6 cm on image 47/3 previously 7.6 x 6.1 cm. -lateral hepatic dome lesion measures 5.7 x 3.5 cm on image 50/3 previously 5.2 x 3.5 cm. Hyperdense material in the gallbladder may reflect biliary sludge. Gallbladder is nondilated. No biliary ductal dilation. Pancreas: No pancreatic ductal dilation or evidence of acute inflammation. Spleen: No splenomegaly Adrenals/Urinary Tract: Bilateral adrenal glands are within normal limits. Mild bilateral hydro ureteral nephrosis to the level of the urinary bladder with perinephric and periureteric stranding. Gas in the urinary bladder wall with perivesicular stranding. Intraluminal gas in the gallbladder is well. Possible fistulous connection with the fluid and gas collection in the mesorectal/presacral space on image 122/3. Stomach/Bowel: Stomach is unremarkable for degree of distension. No pathologic dilation of small or large bowel. Similar surgical changes of left lower quadrant end colostomy with large peristomal hernia containing fat and nonobstructed loops of bowel. Similar appearance of the fluid and gas collection in the presacral/mesorectal space measuring approximally 4.2 x 2.7 cm on image 121/3 previously 4.7 x 2.1 cm. Vascular/Lymphatic: Aortic atherosclerosis. Normal caliber abdominal aorta. No pathologically enlarged abdominal or pelvic lymph nodes. Reproductive: Prostate is unremarkable. Other: Increased pelvic soft tissue stranding/fluid. Prior perineal resection. Musculoskeletal: No aggressive lytic or blastic lesion of bone. Diffuse demineralization of bone. Multilevel degenerative changes spine. IMPRESSION: 1. 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 2. Mild bilateral hydroureteral nephrosis to the level of the urinary bladder with perinephric and periureteric stranding, suggestive of ascending urinary tract infection. 3. Similar appearance  of the fluid and gas collection in the presacral/mesorectal space with possible fistulous connection with the urinary bladder. Consider further evaluation with nonemergent CT cystogram. 4. New patchy bilateral ground-glass opacities, which are nonspecific but favored to reflect an infectious or inflammatory etiology. 5. Similar surgical changes of left lower quadrant end colostomy with large peristomal hernia containing fat and nonobstructed loops of bowel. 6. Indexed hepatic lesions are similar to slightly increased in size in comparison to prior, but evaluation is limited by lack of intravenous  contrast material, attention on follow-up contrasted examination suggested. 7. Increased size of the lingular pulmonary nodule at least somewhat enlarged secondary to new associated atelectasis, attention on follow-up imaging suggested. Stable right upper lobe pulmonary nodule. 8. Aortic Atherosclerosis (ICD10-I70.0) and Emphysema (ICD10-J43.9). These results were called by telephone at the time of interpretation on 08/10/2022 at 11:33 am to provider Margaretmary Eddy , who verbally acknowledged these results. Electronically Signed   By: Dahlia Bailiff M.D.   On: 08/10/2022 11:34   CT Head Wo Contrast  Result Date: 08/10/2022 CLINICAL DATA:  Recent craniectomy. EXAM: CT HEAD WITHOUT CONTRAST TECHNIQUE: Contiguous axial images were obtained from the base of the skull through the vertex without intravenous contrast. RADIATION DOSE REDUCTION: This exam was performed according to the departmental dose-optimization program which includes automated exposure control, adjustment of the mA and/or kV according to patient size and/or use of iterative reconstruction technique. COMPARISON:  MRI brain 08/05/2022.  Head CT 07/21/2022. FINDINGS: Brain: Postoperative changes of left frontal craniotomy and resection of the mass in the left middle frontal gyrus. Expected blood products and pneumocephalus in and around the resection cavity.  Decreased surrounding vasogenic edema, mass effect and rightward midline shift. No significant change from recent brain MRI. Vascular: No hyperdense vessel or unexpected calcification. Skull: Recent left frontal craniotomy. Sinuses/Orbits: Unremarkable. Other: None. IMPRESSION: Postoperative changes of left frontal craniotomy and resection of the mass in the left middle frontal gyrus. Expected blood products and pneumocephalus in and around the resection cavity. Decreased surrounding vasogenic edema, mass effect, and rightward midline shift. No significant change from recent brain MRI. Electronically Signed   By: Emmit Alexanders M.D.   On: 08/10/2022 11:07   DG Chest Portable 1 View  Result Date: 08/10/2022 CLINICAL DATA:  Sepsis. EXAM: PORTABLE CHEST 1 VIEW COMPARISON:  November 30, 2020.  July 13, 2022. FINDINGS: Stable cardiomediastinal silhouette. Right internal jugular Port-A-Cath is noted with distal tip in expected position of the SVC. Minimal bibasilar subsegmental atelectasis or edema is noted. Rounded nodular density is noted in the left lower lobe consistent with metastatic disease as noted on prior CT scan. Bony thorax is unremarkable. IMPRESSION: Rounded nodular density seen in left lower lobe consistent with metastatic disease as noted on prior CT scan. Minimal bibasilar subsegmental atelectasis or edema is noted. Electronically Signed   By: Marijo Conception M.D.   On: 08/10/2022 08:48    I independently reviewed the above imaging studies.  Impression/Recommendation Urosepis with emphysematous cystitis.   Agree with current MGMT with bladder drainage (would leave Foley in place until repeat imaging shows resolution) and broad-spectrum antibiotics pending culture results.   Most cases will resolve with bladder drainage and iv antibiotics but emphysematous cystitis is associated with significant mortality.  Agree with surgery FU imaging of presacral collection and IR drainage if  necessary.  Pamala Hurry 08/11/2022, 6:00 PM

## 2022-08-11 NOTE — Progress Notes (Signed)
Subjective: CC: Seen with RN.  Off vaso. Levo down slightly at 22mg/min. Still on stress dose steroids.  More alert today. Orientated to self, place and knows he is in the hospital for infection. Not orientated for time.  No change in abdominal pain. NPO. Having ostomy output.  UOP bloody. Awaiting urology consult. Palliative saw last night. Full code/full scope.  4/4 Bcx + for E. Coli  Afebrile over the last 24 hours.  Lactic 8.6 > 5.3 > 4.5. WBC 10.5.  AKI improving with Cr 1.56 this am. LFT's still elevated. Plt 16  Objective: Vital signs in last 24 hours: Temp:  [96.6 F (35.9 C)-99.2 F (37.3 C)] 96.6 F (35.9 C) (03/05 0758) Pulse Rate:  [85-129] 127 (03/05 0900) Resp:  [12-31] 30 (03/05 0900) BP: (82-132)/(58-98) 120/82 (03/05 0900) SpO2:  [87 %-100 %] 92 % (03/05 0900) Weight:  [74.6 kg-76.1 kg] 74.6 kg (03/05 0403) Last BM Date : 08/10/22  Intake/Output from previous day: 03/04 0701 - 03/05 0700 In: 8466.6 [I.V.:3352.2; IV Piggyback:5114.4] Out: 6425 [Urine:6200; Stool:225] Intake/Output this shift: Total I/O In: 137.3 [I.V.:137.3] Out: -   PE: General: Chronically ill appearing male who is laying in bed HEENT: L frontal craniotomy incision with staples in place cdi. Card:  Tachycardic.  Abd:  Soft, NT/ND, +BS. Ostomy viable appearing with liquid stool in ostomy bag. Peristomal hernia is soft.  GU: Foley in place with bloody urine. Psych: A&Ox 2-3  Lab Results:  Recent Labs    08/10/22 0809 08/10/22 0853 08/11/22 0336  WBC 4.1  --  10.5  HGB 8.3* 7.5* 7.2*  HCT 25.2* 22.0* 21.3*  PLT 46*  --  16*   BMET Recent Labs    08/10/22 2301 08/11/22 0336  NA 135 136  K 3.7 3.5  CL 103 102  CO2 20* 19*  GLUCOSE 175* 150*  BUN 36* 35*  CREATININE 1.73* 1.56*  CALCIUM 8.3* 8.5*   PT/INR Recent Labs    08/10/22 0809  LABPROT 19.0*  INR 1.6*   CMP     Component Value Date/Time   NA 136 08/11/2022 0336   K 3.5 08/11/2022 0336   CL  102 08/11/2022 0336   CO2 19 (L) 08/11/2022 0336   GLUCOSE 150 (H) 08/11/2022 0336   BUN 35 (H) 08/11/2022 0336   CREATININE 1.56 (H) 08/11/2022 0336   CREATININE 11.07 (HH) 07/20/2022 0910   CALCIUM 8.5 (L) 08/11/2022 0336   PROT 5.8 (L) 08/11/2022 0336   ALBUMIN 3.3 (L) 08/11/2022 0336   AST 472 (H) 08/11/2022 0336   AST 13 (L) 07/20/2022 0910   ALT 340 (H) 08/11/2022 0336   ALT 14 07/20/2022 0910   ALKPHOS 109 08/11/2022 0336   BILITOT 3.5 (H) 08/11/2022 0336   BILITOT 1.0 07/20/2022 0910   GFRNONAA 53 (L) 08/11/2022 0336   GFRNONAA 5 (L) 07/20/2022 0910   Lipase     Component Value Date/Time   LIPASE 30 07/21/2022 2226    Studies/Results: DG Chest Port 1 View  Result Date: 08/11/2022 CLINICAL DATA:  52year old male with history of pulmonary infiltrates. EXAM: PORTABLE CHEST 1 VIEW COMPARISON:  Chest x-ray 08/10/2022. FINDINGS: Right internal jugular single-lumen power porta cath with tip terminating in the superior cavoatrial junction. Widespread areas of interstitial prominence and patchy multifocal airspace consolidation scattered throughout the lungs bilaterally, most severe in the right mid lung and throughout the entirety of the left lung, particularly in the left upper lobe. No definite pleural  effusions. No pneumothorax. Known lingular nodule partially obscured by airspace consolidation. Pulmonary vasculature appears engorged, but there is no evidence of pulmonary edema. Heart size is upper limits of normal. Upper mediastinal contours are within normal limits. IMPRESSION: 1. Progressive worsening asymmetric airspace consolidation throughout the lungs bilaterally, concerning for severe multilobar bilateral pneumonia. 2. Known lingular nodule now obscured by airspace consolidation. Electronically Signed   By: Vinnie Langton M.D.   On: 08/11/2022 05:55   CT CHEST ABDOMEN PELVIS WO CONTRAST  Result Date: 08/10/2022 CLINICAL DATA:  History of metastatic colon cancer and  recurrent intra-abdominal abscesses with sepsis, right lung rhonchi. * Tracking Code: BO * EXAM: CT CHEST, ABDOMEN AND PELVIS WITHOUT CONTRAST TECHNIQUE: Multidetector CT imaging of the chest, abdomen and pelvis was performed following the standard protocol without IV contrast. RADIATION DOSE REDUCTION: This exam was performed according to the departmental dose-optimization program which includes automated exposure control, adjustment of the mA and/or kV according to patient size and/or use of iterative reconstruction technique. COMPARISON:  Multiple priors including CT July 13, 2022 and July 21, 2022. FINDINGS: Despite efforts by the technologist and patient, motion artifact is present on today's exam and could not be eliminated. This reduces exam sensitivity and specificity. CT CHEST FINDINGS Cardiovascular: Accessed right chest Port-A-Cath with tip at the superior cavoatrial junction. Gas in the brachiocephalic vein from vascular access. Aortic atherosclerosis. Normal size heart. No significant pericardial effusion/thickening. Mediastinum/Nodes: Suspicious thyroid nodule. No pathologically enlarged mediastinal, hilar or axillary lymph nodes, noting limited sensitivity for the detection of hilar adenopathy on this noncontrast study. The esophagus is grossly unremarkable. Lungs/Pleura: New patchy bilateral ground-glass opacities. Linear band of atelectasis associated with the lobular lingular pulmonary nodule now measuring 2.3 x 2.1 cm in total on image 94/5 previously 2.3 x 1.7 cm. Central right upper lobe pulmonary nodule measures 16 x 9 mm on image 48/5 previously 15 x 9 mm. Upper lobe pleuroparenchymal scarring. Musculoskeletal: No aggressive lytic or blastic lesion of bone. CT ABDOMEN PELVIS FINDINGS Hepatobiliary: Similar surgical changes of prior left hepatic lobe wedge resection. Ablation defect in the anterior right hepatic lobe appears similar. Indexed hepatic lesions are similar to slightly  increased in size in comparison to prior, but evaluation is limited by lack of intravenous contrast material. For reference: -medial hepatic dome lesion measures 8.3 x 5.6 cm on image 47/3 previously 7.6 x 6.1 cm. -lateral hepatic dome lesion measures 5.7 x 3.5 cm on image 50/3 previously 5.2 x 3.5 cm. Hyperdense material in the gallbladder may reflect biliary sludge. Gallbladder is nondilated. No biliary ductal dilation. Pancreas: No pancreatic ductal dilation or evidence of acute inflammation. Spleen: No splenomegaly Adrenals/Urinary Tract: Bilateral adrenal glands are within normal limits. Mild bilateral hydro ureteral nephrosis to the level of the urinary bladder with perinephric and periureteric stranding. Gas in the urinary bladder wall with perivesicular stranding. Intraluminal gas in the gallbladder is well. Possible fistulous connection with the fluid and gas collection in the mesorectal/presacral space on image 122/3. Stomach/Bowel: Stomach is unremarkable for degree of distension. No pathologic dilation of small or large bowel. Similar surgical changes of left lower quadrant end colostomy with large peristomal hernia containing fat and nonobstructed loops of bowel. Similar appearance of the fluid and gas collection in the presacral/mesorectal space measuring approximally 4.2 x 2.7 cm on image 121/3 previously 4.7 x 2.1 cm. Vascular/Lymphatic: Aortic atherosclerosis. Normal caliber abdominal aorta. No pathologically enlarged abdominal or pelvic lymph nodes. Reproductive: Prostate is unremarkable. Other: Increased pelvic soft tissue stranding/fluid.  Prior perineal resection. Musculoskeletal: No aggressive lytic or blastic lesion of bone. Diffuse demineralization of bone. Multilevel degenerative changes spine. IMPRESSION: 1. 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 2. Mild bilateral hydroureteral nephrosis to the  level of the urinary bladder with perinephric and periureteric stranding, suggestive of ascending urinary tract infection. 3. Similar appearance of the fluid and gas collection in the presacral/mesorectal space with possible fistulous connection with the urinary bladder. Consider further evaluation with nonemergent CT cystogram. 4. New patchy bilateral ground-glass opacities, which are nonspecific but favored to reflect an infectious or inflammatory etiology. 5. Similar surgical changes of left lower quadrant end colostomy with large peristomal hernia containing fat and nonobstructed loops of bowel. 6. Indexed hepatic lesions are similar to slightly increased in size in comparison to prior, but evaluation is limited by lack of intravenous contrast material, attention on follow-up contrasted examination suggested. 7. Increased size of the lingular pulmonary nodule at least somewhat enlarged secondary to new associated atelectasis, attention on follow-up imaging suggested. Stable right upper lobe pulmonary nodule. 8. Aortic Atherosclerosis (ICD10-I70.0) and Emphysema (ICD10-J43.9). These results were called by telephone at the time of interpretation on 08/10/2022 at 11:33 am to provider Margaretmary Eddy , who verbally acknowledged these results. Electronically Signed   By: Dahlia Bailiff M.D.   On: 08/10/2022 11:34   CT Head Wo Contrast  Result Date: 08/10/2022 CLINICAL DATA:  Recent craniectomy. EXAM: CT HEAD WITHOUT CONTRAST TECHNIQUE: Contiguous axial images were obtained from the base of the skull through the vertex without intravenous contrast. RADIATION DOSE REDUCTION: This exam was performed according to the departmental dose-optimization program which includes automated exposure control, adjustment of the mA and/or kV according to patient size and/or use of iterative reconstruction technique. COMPARISON:  MRI brain 08/05/2022.  Head CT 07/21/2022. FINDINGS: Brain: Postoperative changes of left frontal  craniotomy and resection of the mass in the left middle frontal gyrus. Expected blood products and pneumocephalus in and around the resection cavity. Decreased surrounding vasogenic edema, mass effect and rightward midline shift. No significant change from recent brain MRI. Vascular: No hyperdense vessel or unexpected calcification. Skull: Recent left frontal craniotomy. Sinuses/Orbits: Unremarkable. Other: None. IMPRESSION: Postoperative changes of left frontal craniotomy and resection of the mass in the left middle frontal gyrus. Expected blood products and pneumocephalus in and around the resection cavity. Decreased surrounding vasogenic edema, mass effect, and rightward midline shift. No significant change from recent brain MRI. Electronically Signed   By: Emmit Alexanders M.D.   On: 08/10/2022 11:07   DG Chest Portable 1 View  Result Date: 08/10/2022 CLINICAL DATA:  Sepsis. EXAM: PORTABLE CHEST 1 VIEW COMPARISON:  November 30, 2020.  July 13, 2022. FINDINGS: Stable cardiomediastinal silhouette. Right internal jugular Port-A-Cath is noted with distal tip in expected position of the SVC. Minimal bibasilar subsegmental atelectasis or edema is noted. Rounded nodular density is noted in the left lower lobe consistent with metastatic disease as noted on prior CT scan. Bony thorax is unremarkable. IMPRESSION: Rounded nodular density seen in left lower lobe consistent with metastatic disease as noted on prior CT scan. Minimal bibasilar subsegmental atelectasis or edema is noted. Electronically Signed   By: Marijo Conception M.D.   On: 08/10/2022 08:48    Anti-infectives: Anti-infectives (From admission, onward)    Start     Dose/Rate Route Frequency Ordered Stop   08/10/22 2200  ceFEPIme (MAXIPIME) 2 g in sodium chloride 0.9 %  100 mL IVPB  Status:  Discontinued        2 g 200 mL/hr over 30 Minutes Intravenous Every 12 hours 08/10/22 0942 08/10/22 1311   08/10/22 1600  meropenem (MERREM) 1 g in sodium chloride  0.9 % 100 mL IVPB        1 g 200 mL/hr over 30 Minutes Intravenous Every 12 hours 08/10/22 1342     08/10/22 1010  vancomycin variable dose per unstable renal function (pharmacist dosing)  Status:  Discontinued         Does not apply See admin instructions 08/10/22 1010 08/10/22 2146   08/10/22 0830  ceFEPIme (MAXIPIME) 2 g in sodium chloride 0.9 % 100 mL IVPB        2 g 200 mL/hr over 30 Minutes Intravenous  Once 08/10/22 0825 08/10/22 0906   08/10/22 0830  metroNIDAZOLE (FLAGYL) IVPB 500 mg        500 mg 100 mL/hr over 60 Minutes Intravenous  Once 08/10/22 0825 08/10/22 1036   08/10/22 0830  vancomycin (VANCOCIN) IVPB 1000 mg/200 mL premix  Status:  Discontinued        1,000 mg 200 mL/hr over 60 Minutes Intravenous  Once 08/10/22 0825 08/10/22 0828   08/10/22 0830  vancomycin (VANCOREADY) IVPB 1500 mg/300 mL        1,500 mg 150 mL/hr over 120 Minutes Intravenous  Once 08/10/22 0828 08/10/22 1236        Metastatic rectal adenocarcinoma: 11/30/2020 - rectal biopsies confirm adenocarcinoma  12/13/2020 - descending loop colostomy, Dr. Harlow Asa S/p neoadjuvant FOLFOX chemotherapy 08/28/2021- open left hepatectomy Dr. [name] 10/2021- ablation of a right liver metastasis  S/p chemotherapy and radiation therapy of rectal mass S/p radiation therapy of lung lesions 02/04/2022 - robotic APR, parastomal hernia repair Dr. [name]  S/p radiation therapy of lung lesions 10-04/2022 IR drainage of pre-sacral collections, injection studies not significant for fistula to bladder at that time. 07/31/2022 S/p single dose of radiation therapy to the brain 08/05/2022 - left frontal craniotomy for tumor resection, Dr. [name]  Patient currently being considered for Y90 treatments of liver metastasis by IR Dr. Kathlene Cote    Assessment/Plan 51 y/o M undergoing treatment for oligometastatic rectal cancer (see above treatment timeline) currently with 2 visible liver metastases and a solitary lesion in the right upper  lobe of the lung who presents in septic shock. 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 Foley in place. Now with +Bcx's. Perineal wound clean without signs of infection yesterday and he has a non-obstructive bowel gas pattern. No emergent surgical needs today.  The fluid in the presacral space may be phlegmon or recurrent cancer. If he does not improve, will ask IR to evaluate for potential aspiration, drain, or biopsy. Appreciate CCM mgmt of septic shock. He has decreasing pressor support with levophed and is off vasopressin. Await Urology recs. Agree with establishing clear GOC (palliative following). CCS will follow closely with you.   FEN - NPO, IVF VTE - SCD's. Chemical VTE held due to thrombocytopenia  ID - On Merrem. E. Coli UTI/Bacteremia.  Admit - ICU, CCM service, urology consults pending   I reviewed nursing notes, hospitalist notes, last 24 h vitals and pain scores, last 48 h intake and output, last 24 h labs and trends, and last 24 h imaging results.   LOS: 1 day    Jillyn Ledger , [facility] 08/11/2022, 9:50 AM Please see Amion for  pager number during day hours 7:00am-4:30pm

## 2022-08-12 ENCOUNTER — Inpatient Hospital Stay: Payer: Medicaid Other | Admitting: Nurse Practitioner

## 2022-08-12 DIAGNOSIS — R6521 Severe sepsis with septic shock: Secondary | ICD-10-CM | POA: Diagnosis not present

## 2022-08-12 DIAGNOSIS — A419 Sepsis, unspecified organism: Secondary | ICD-10-CM | POA: Diagnosis not present

## 2022-08-12 DIAGNOSIS — J9601 Acute respiratory failure with hypoxia: Secondary | ICD-10-CM

## 2022-08-12 DIAGNOSIS — B9620 Unspecified Escherichia coli [E. coli] as the cause of diseases classified elsewhere: Secondary | ICD-10-CM | POA: Diagnosis not present

## 2022-08-12 LAB — GLUCOSE, CAPILLARY
Glucose-Capillary: 140 mg/dL — ABNORMAL HIGH (ref 70–99)
Glucose-Capillary: 158 mg/dL — ABNORMAL HIGH (ref 70–99)
Glucose-Capillary: 234 mg/dL — ABNORMAL HIGH (ref 70–99)
Glucose-Capillary: 257 mg/dL — ABNORMAL HIGH (ref 70–99)
Glucose-Capillary: 355 mg/dL — ABNORMAL HIGH (ref 70–99)
Glucose-Capillary: 256 mg/dL — ABNORMAL HIGH (ref 70–99)

## 2022-08-12 LAB — TYPE AND SCREEN
ABO/RH(D): A NEG
Antibody Screen: NEGATIVE

## 2022-08-12 MED ORDER — AMIODARONE HCL IN DEXTROSE 360-4.14 MG/200ML-% IV SOLN
60.0000 mg/h | INTRAVENOUS | Status: AC
  Administered 2022-08-12 (×2): 60 mg/h via INTRAVENOUS
  Filled 2022-08-12 (×2): qty 200

## 2022-08-12 MED ORDER — INSULIN ASPART 100 UNIT/ML IJ SOLN
0.0000 [IU] | Freq: Every day | INTRAMUSCULAR | Status: DC
  Administered 2022-08-12: 3 [IU] via SUBCUTANEOUS
  Administered 2022-08-13 – 2022-08-14 (×2): 2 [IU] via SUBCUTANEOUS

## 2022-08-12 MED ORDER — ORAL CARE MOUTH RINSE: 15.0000 mL | OROMUCOSAL | Status: DC | PRN

## 2022-08-12 MED ORDER — PANTOPRAZOLE SODIUM 40 MG PO TBEC
40.0000 mg | DELAYED_RELEASE_TABLET | Freq: Every day | ORAL | Status: DC
  Administered 2022-08-12 – 2022-08-19 (×8): 40 mg via ORAL
  Filled 2022-08-12 (×8): qty 1

## 2022-08-12 MED ORDER — AMIODARONE HCL IN DEXTROSE 360-4.14 MG/200ML-% IV SOLN
30.0000 mg/h | INTRAVENOUS | Status: DC
  Administered 2022-08-12 – 2022-08-15 (×9): 30 mg/h via INTRAVENOUS
  Filled 2022-08-12 (×7): qty 200

## 2022-08-12 MED ORDER — VASOPRESSIN 20 UNITS/100 ML INFUSION FOR SHOCK
0.0000 [IU]/min | INTRAVENOUS | Status: DC
  Administered 2022-08-12 – 2022-08-13 (×3): 0.03 [IU]/min via INTRAVENOUS
  Filled 2022-08-12 (×3): qty 100

## 2022-08-12 MED ORDER — AMIODARONE LOAD VIA INFUSION
150.0000 mg | Freq: Once | INTRAVENOUS | Status: AC
  Administered 2022-08-12: 150 mg via INTRAVENOUS
  Filled 2022-08-12: qty 83.34

## 2022-08-12 MED ORDER — SODIUM CHLORIDE 0.9 % IV SOLN
2.0000 g | INTRAVENOUS | Status: DC
  Administered 2022-08-12 – 2022-08-13 (×2): 2 g via INTRAVENOUS
  Filled 2022-08-12 (×2): qty 20

## 2022-08-12 MED ORDER — LEVETIRACETAM 500 MG PO TABS
500.0000 mg | ORAL_TABLET | Freq: Two times a day (BID) | ORAL | Status: AC
  Administered 2022-08-12 – 2022-08-15 (×6): 500 mg via ORAL
  Filled 2022-08-12 (×6): qty 1

## 2022-08-12 MED ORDER — INSULIN ASPART 100 UNIT/ML IJ SOLN
0.0000 [IU] | Freq: Three times a day (TID) | INTRAMUSCULAR | Status: DC
  Administered 2022-08-12: 5 [IU] via SUBCUTANEOUS
  Administered 2022-08-12: 15 [IU] via SUBCUTANEOUS
  Administered 2022-08-13 (×3): 8 [IU] via SUBCUTANEOUS
  Administered 2022-08-14: 2 [IU] via SUBCUTANEOUS
  Administered 2022-08-14: 3 [IU] via SUBCUTANEOUS
  Administered 2022-08-14: 8 [IU] via SUBCUTANEOUS
  Administered 2022-08-15: 2 [IU] via SUBCUTANEOUS
  Administered 2022-08-15: 8 [IU] via SUBCUTANEOUS
  Administered 2022-08-16: 3 [IU] via SUBCUTANEOUS
  Administered 2022-08-17: 5 [IU] via SUBCUTANEOUS
  Administered 2022-08-17: 3 [IU] via SUBCUTANEOUS

## 2022-08-12 MED ORDER — MAGNESIUM SULFATE 2 GM/50ML IV SOLN
2.0000 g | Freq: Once | INTRAVENOUS | Status: AC
  Administered 2022-08-12: 2 g via INTRAVENOUS
  Filled 2022-08-12: qty 50

## 2022-08-12 MED ORDER — LEVETIRACETAM IN NACL 500 MG/100ML IV SOLN: 500.0000 mg | Freq: Two times a day (BID) | INTRAVENOUS | Status: DC

## 2022-08-12 NOTE — Progress Notes (Signed)
Physical Therapy Treatment Patient Details Name: James Coffey MRN: [id number] DOB: 12-12-1970 Today's Date: 08/12/2022   History of Present Illness 52 y.o. male who presents on 08/10/22 with fever x several days. Was admitted with septic shock, acute respiratory failure, AKI, encephalopathy. PMH includes rectal cancer with mets to liver (L lateral hepatectomy 08/28/21), lungs, and brain (L frontal craniotomy for tumor resection on 08/05/22), fournier gangrene, perianal abscess, loop colostomy.    PT Comments    Pt requires mod encouragement to participate in mobility this date, states he has been tired all day. Pt agreeable to transfer-level session only today given fatigue, mobilizing at min assist level at this time. Pt is self-limiting, anticipate pt could walk hallway distances if motivated. PT to continue to follow.      Recommendations for follow up therapy are one component of a multi-disciplinary discharge planning process, led by the attending physician.  Recommendations may be updated based on patient status, additional functional criteria and insurance authorization.  Follow Up Recommendations  Home health PT     Assistance Recommended at Discharge Intermittent Supervision/Assistance  Patient can return home with the following A little help with walking and/or transfers;A little help with bathing/dressing/bathroom;Assistance with cooking/housework   Equipment Recommendations  None recommended by PT    Recommendations for Other Services       Precautions / Restrictions Precautions Precautions: Fall Restrictions Weight Bearing Restrictions: No Other Position/Activity Restrictions: colostomy, recent crani     Mobility  Bed Mobility Overal bed mobility: Needs Assistance Bed Mobility: Supine to Sit, Sit to Supine     Supine to sit: Min assist     General bed mobility comments: assist for trunk elevation, LE progression completely off EOB    Transfers Overall transfer  level: Needs assistance Equipment used: 1 person hand held assist Transfers: Sit to/from Stand Sit to Stand: Min assist   Step pivot transfers: Min assist       General transfer comment: assist for power up, rise, steady, and pivot to recliner towards L. SpO2 stable on 3LO2, HR up to 130s in afib rhyhtm    Ambulation/Gait                   Stairs             Wheelchair Mobility    Modified Rankin (Stroke Patients Only)       Balance Overall balance assessment: Needs assistance Sitting-balance support: No upper extremity supported, Feet supported Sitting balance-Leahy Scale: Fair     Standing balance support: Single extremity supported, During functional activity Standing balance-Leahy Scale: Fair                              Cognition Arousal/Alertness: Awake/alert Behavior During Therapy: WFL for tasks assessed/performed Overall Cognitive Status: Within Functional Limits for tasks assessed                                 General Comments: irritable about getting OOB, and intermittently about different things during session (catheter, lines/leads)        Exercises      General Comments        Pertinent Vitals/Pain Pain Assessment Pain Assessment: Faces Faces Pain Scale: Hurts little more Pain Location: urethral catheter site Pain Descriptors / Indicators: Grimacing, Guarding Pain Intervention(s): Limited activity within patient's tolerance, Monitored during session, Repositioned  Home Living                          Prior Function            PT Goals (current goals can now be found in the care plan section) Acute Rehab PT Goals Patient Stated Goal: to go home PT Goal Formulation: With patient Time For Goal Achievement: 08/25/22 Potential to Achieve Goals: Good Progress towards PT goals: Progressing toward goals    Frequency    Min 3X/week      PT Plan Current plan remains appropriate     Co-evaluation              AM-PAC PT "6 Clicks" Mobility   Outcome Measure  Help needed turning from your back to your side while in a flat bed without using bedrails?: A Little Help needed moving from lying on your back to sitting on the side of a flat bed without using bedrails?: A Little Help needed moving to and from a bed to a chair (including a wheelchair)?: A Little Help needed standing up from a chair using your arms (e.g., wheelchair or bedside chair)?: A Little Help needed to walk in hospital room?: A Lot Help needed climbing 3-5 steps with a railing? : A Lot 6 Click Score: 16    End of Session Equipment Utilized During Treatment: Oxygen Activity Tolerance: Patient limited by fatigue Patient left: in chair;with call bell/phone within reach;with chair alarm set Nurse Communication: Mobility status PT Visit Diagnosis: Muscle weakness (generalized) (M62.81);Difficulty in walking, not elsewhere classified (R26.2);Unsteadiness on feet (R26.81)     Time: [id number] PT Time Calculation (min) (ACUTE ONLY): 17 min  Charges:  $Therapeutic Activity: 8-22 mins                     James Coffey, PT DPT Acute Rehabilitation Services Pager [phone]  Office [phone]    James Coffey 08/12/2022, 4:10 PM

## 2022-08-12 NOTE — Progress Notes (Signed)
Subjective: CC: Seen with RN.  Weaned off pressors this am.  New onset A. Fib. Getting placed on amio. Heparin held due to thrombocytopenia   More alert today. Abdominal pain improved. Tolerating reg diet. No n/v. Having ostomy output.    UOP now more amber/less bloody. 2.3L/24 hours. Urology saw yesterday. Note reviewed.  Palliative saw last night. Full code/full scope. Note reviewed. Patient does not want PMT to call his family or to be involved in his care.   Afebrile over the last 24 hours. WBC 9.3 Plt 21 Met panel pending.   Objective: Vital signs in last 24 hours: Temp:  [97.4 F (36.3 C)-99 F (37.2 C)] 99 F (37.2 C) (03/06 0725) Pulse Rate:  [79-133] 116 (03/06 0810) Resp:  [17-39] 21 (03/06 0810) BP: (74-124)/(53-95) 111/70 (03/06 0810) SpO2:  [91 %-100 %] 100 % (03/06 0810) Weight:  [78.8 kg] 78.8 kg (03/06 0333) Last BM Date : 08/12/22  Intake/Output from previous day: 03/05 0701 - 03/06 0700 In: 1125 [I.V.:624.9; IV Piggyback:500.1] Out: 2375 [Urine:2375] Intake/Output this shift: Total I/O In: 34.9 [I.V.:34.9] Out: 200 [Urine:200]  PE: General: Chronically ill appearing male who is laying in bed Card:  Tachycardic.  Abd:  Soft, NT/ND, +BS. Ostomy viable appearing with liquid stool in ostomy bag. Peristomal hernia is soft.  GU: Foley in place with amber colored urine.  Lab Results:  Recent Labs    08/11/22 0336 08/11/22 1216  WBC 10.5 9.3  HGB 7.2* 7.4*  HCT 21.3* 22.7*  PLT 16* 21*    BMET Recent Labs    08/10/22 2301 08/11/22 0336  NA 135 136  K 3.7 3.5  CL 103 102  CO2 20* 19*  GLUCOSE 175* 150*  BUN 36* 35*  CREATININE 1.73* 1.56*  CALCIUM 8.3* 8.5*    PT/INR Recent Labs    08/10/22 0809  LABPROT 19.0*  INR 1.6*    CMP     Component Value Date/Time   NA 136 08/11/2022 0336   K 3.5 08/11/2022 0336   CL 102 08/11/2022 0336   CO2 19 (L) 08/11/2022 0336   GLUCOSE 150 (H) 08/11/2022 0336   BUN 35 (H) 08/11/2022  0336   CREATININE 1.56 (H) 08/11/2022 0336   CREATININE 11.07 (HH) 07/20/2022 0910   CALCIUM 8.5 (L) 08/11/2022 0336   PROT 5.8 (L) 08/11/2022 0336   ALBUMIN 3.3 (L) 08/11/2022 0336   AST 472 (H) 08/11/2022 0336   AST 13 (L) 07/20/2022 0910   ALT 340 (H) 08/11/2022 0336   ALT 14 07/20/2022 0910   ALKPHOS 109 08/11/2022 0336   BILITOT 3.5 (H) 08/11/2022 0336   BILITOT 1.0 07/20/2022 0910   GFRNONAA 53 (L) 08/11/2022 0336   GFRNONAA 5 (L) 07/20/2022 0910   Lipase     Component Value Date/Time   LIPASE 30 07/21/2022 2226    Studies/Results: DG Chest Port 1 View  Result Date: 08/11/2022 CLINICAL DATA:  52 year old male with history of pulmonary infiltrates. EXAM: PORTABLE CHEST 1 VIEW COMPARISON:  Chest x-ray 08/10/2022. FINDINGS: Right internal jugular single-lumen power porta cath with tip terminating in the superior cavoatrial junction. Widespread areas of interstitial prominence and patchy multifocal airspace consolidation scattered throughout the lungs bilaterally, most severe in the right mid lung and throughout the entirety of the left lung, particularly in the left upper lobe. No definite pleural effusions. No pneumothorax. Known lingular nodule partially obscured by airspace consolidation. Pulmonary vasculature appears engorged, but there is no evidence of  pulmonary edema. Heart size is upper limits of normal. Upper mediastinal contours are within normal limits. IMPRESSION: 1. Progressive worsening asymmetric airspace consolidation throughout the lungs bilaterally, concerning for severe multilobar bilateral pneumonia. 2. Known lingular nodule now obscured by airspace consolidation. Electronically Signed   By: Vinnie Langton M.D.   On: 08/11/2022 05:55   CT CHEST ABDOMEN PELVIS WO CONTRAST  Result Date: 08/10/2022 CLINICAL DATA:  History of metastatic colon cancer and recurrent intra-abdominal abscesses with sepsis, right lung rhonchi. * Tracking Code: BO * EXAM: CT CHEST, ABDOMEN  AND PELVIS WITHOUT CONTRAST TECHNIQUE: Multidetector CT imaging of the chest, abdomen and pelvis was performed following the standard protocol without IV contrast. RADIATION DOSE REDUCTION: This exam was performed according to the departmental dose-optimization program which includes automated exposure control, adjustment of the mA and/or kV according to patient size and/or use of iterative reconstruction technique. COMPARISON:  Multiple priors including CT July 13, 2022 and July 21, 2022. FINDINGS: Despite efforts by the technologist and patient, motion artifact is present on today's exam and could not be eliminated. This reduces exam sensitivity and specificity. CT CHEST FINDINGS Cardiovascular: Accessed right chest Port-A-Cath with tip at the superior cavoatrial junction. Gas in the brachiocephalic vein from vascular access. Aortic atherosclerosis. Normal size heart. No significant pericardial effusion/thickening. Mediastinum/Nodes: Suspicious thyroid nodule. No pathologically enlarged mediastinal, hilar or axillary lymph nodes, noting limited sensitivity for the detection of hilar adenopathy on this noncontrast study. The esophagus is grossly unremarkable. Lungs/Pleura: New patchy bilateral ground-glass opacities. Linear band of atelectasis associated with the lobular lingular pulmonary nodule now measuring 2.3 x 2.1 cm in total on image 94/5 previously 2.3 x 1.7 cm. Central right upper lobe pulmonary nodule measures 16 x 9 mm on image 48/5 previously 15 x 9 mm. Upper lobe pleuroparenchymal scarring. Musculoskeletal: No aggressive lytic or blastic lesion of bone. CT ABDOMEN PELVIS FINDINGS Hepatobiliary: Similar surgical changes of prior left hepatic lobe wedge resection. Ablation defect in the anterior right hepatic lobe appears similar. Indexed hepatic lesions are similar to slightly increased in size in comparison to prior, but evaluation is limited by lack of intravenous contrast material. For  reference: -medial hepatic dome lesion measures 8.3 x 5.6 cm on image 47/3 previously 7.6 x 6.1 cm. -lateral hepatic dome lesion measures 5.7 x 3.5 cm on image 50/3 previously 5.2 x 3.5 cm. Hyperdense material in the gallbladder may reflect biliary sludge. Gallbladder is nondilated. No biliary ductal dilation. Pancreas: No pancreatic ductal dilation or evidence of acute inflammation. Spleen: No splenomegaly Adrenals/Urinary Tract: Bilateral adrenal glands are within normal limits. Mild bilateral hydro ureteral nephrosis to the level of the urinary bladder with perinephric and periureteric stranding. Gas in the urinary bladder wall with perivesicular stranding. Intraluminal gas in the gallbladder is well. Possible fistulous connection with the fluid and gas collection in the mesorectal/presacral space on image 122/3. Stomach/Bowel: Stomach is unremarkable for degree of distension. No pathologic dilation of small or large bowel. Similar surgical changes of left lower quadrant end colostomy with large peristomal hernia containing fat and nonobstructed loops of bowel. Similar appearance of the fluid and gas collection in the presacral/mesorectal space measuring approximally 4.2 x 2.7 cm on image 121/3 previously 4.7 x 2.1 cm. Vascular/Lymphatic: Aortic atherosclerosis. Normal caliber abdominal aorta. No pathologically enlarged abdominal or pelvic lymph nodes. Reproductive: Prostate is unremarkable. Other: Increased pelvic soft tissue stranding/fluid. Prior perineal resection. Musculoskeletal: No aggressive lytic or blastic lesion of bone. Diffuse demineralization of bone. Multilevel degenerative changes spine. IMPRESSION:  1. 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 2. Mild bilateral hydroureteral nephrosis to the level of the urinary bladder with perinephric and periureteric stranding, suggestive of ascending urinary tract  infection. 3. Similar appearance of the fluid and gas collection in the presacral/mesorectal space with possible fistulous connection with the urinary bladder. Consider further evaluation with nonemergent CT cystogram. 4. New patchy bilateral ground-glass opacities, which are nonspecific but favored to reflect an infectious or inflammatory etiology. 5. Similar surgical changes of left lower quadrant end colostomy with large peristomal hernia containing fat and nonobstructed loops of bowel. 6. Indexed hepatic lesions are similar to slightly increased in size in comparison to prior, but evaluation is limited by lack of intravenous contrast material, attention on follow-up contrasted examination suggested. 7. Increased size of the lingular pulmonary nodule at least somewhat enlarged secondary to new associated atelectasis, attention on follow-up imaging suggested. Stable right upper lobe pulmonary nodule. 8. Aortic Atherosclerosis (ICD10-I70.0) and Emphysema (ICD10-J43.9). These results were called by telephone at the time of interpretation on 08/10/2022 at 11:33 am to provider Margaretmary Eddy , who verbally acknowledged these results. Electronically Signed   By: Dahlia Bailiff M.D.   On: 08/10/2022 11:34   CT Head Wo Contrast  Result Date: 08/10/2022 CLINICAL DATA:  Recent craniectomy. EXAM: CT HEAD WITHOUT CONTRAST TECHNIQUE: Contiguous axial images were obtained from the base of the skull through the vertex without intravenous contrast. RADIATION DOSE REDUCTION: This exam was performed according to the departmental dose-optimization program which includes automated exposure control, adjustment of the mA and/or kV according to patient size and/or use of iterative reconstruction technique. COMPARISON:  MRI brain 08/05/2022.  Head CT 07/21/2022. FINDINGS: Brain: Postoperative changes of left frontal craniotomy and resection of the mass in the left middle frontal gyrus. Expected blood products and pneumocephalus in and  around the resection cavity. Decreased surrounding vasogenic edema, mass effect and rightward midline shift. No significant change from recent brain MRI. Vascular: No hyperdense vessel or unexpected calcification. Skull: Recent left frontal craniotomy. Sinuses/Orbits: Unremarkable. Other: None. IMPRESSION: Postoperative changes of left frontal craniotomy and resection of the mass in the left middle frontal gyrus. Expected blood products and pneumocephalus in and around the resection cavity. Decreased surrounding vasogenic edema, mass effect, and rightward midline shift. No significant change from recent brain MRI. Electronically Signed   By: Emmit Alexanders M.D.   On: 08/10/2022 11:07   DG Chest Portable 1 View  Result Date: 08/10/2022 CLINICAL DATA:  Sepsis. EXAM: PORTABLE CHEST 1 VIEW COMPARISON:  November 30, 2020.  July 13, 2022. FINDINGS: Stable cardiomediastinal silhouette. Right internal jugular Port-A-Cath is noted with distal tip in expected position of the SVC. Minimal bibasilar subsegmental atelectasis or edema is noted. Rounded nodular density is noted in the left lower lobe consistent with metastatic disease as noted on prior CT scan. Bony thorax is unremarkable. IMPRESSION: Rounded nodular density seen in left lower lobe consistent with metastatic disease as noted on prior CT scan. Minimal bibasilar subsegmental atelectasis or edema is noted. Electronically Signed   By: Marijo Conception M.D.   On: 08/10/2022 08:48    Anti-infectives: Anti-infectives (From admission, onward)    Start     Dose/Rate Route Frequency Ordered Stop   08/12/22 0830  cefTRIAXone (ROCEPHIN) 2 g in sodium chloride 0.9 % 100 mL IVPB        2 g 200 mL/hr over 30 Minutes Intravenous Every 24 hours  08/12/22 0737     08/11/22 1800  meropenem (MERREM) 1 g in sodium chloride 0.9 % 100 mL IVPB  Status:  Discontinued        1 g 200 mL/hr over 30 Minutes Intravenous Every 8 hours 08/11/22 1442 08/12/22 0737   08/10/22 2200   ceFEPIme (MAXIPIME) 2 g in sodium chloride 0.9 % 100 mL IVPB  Status:  Discontinued        2 g 200 mL/hr over 30 Minutes Intravenous Every 12 hours 08/10/22 0942 08/10/22 1311   08/10/22 1600  meropenem (MERREM) 1 g in sodium chloride 0.9 % 100 mL IVPB  Status:  Discontinued        1 g 200 mL/hr over 30 Minutes Intravenous Every 12 hours 08/10/22 1342 08/11/22 1442   08/10/22 1010  vancomycin variable dose per unstable renal function (pharmacist dosing)  Status:  Discontinued         Does not apply See admin instructions 08/10/22 1010 08/10/22 2146   08/10/22 0830  ceFEPIme (MAXIPIME) 2 g in sodium chloride 0.9 % 100 mL IVPB        2 g 200 mL/hr over 30 Minutes Intravenous  Once 08/10/22 0825 08/10/22 0906   08/10/22 0830  metroNIDAZOLE (FLAGYL) IVPB 500 mg        500 mg 100 mL/hr over 60 Minutes Intravenous  Once 08/10/22 0825 08/10/22 1036   08/10/22 0830  vancomycin (VANCOCIN) IVPB 1000 mg/200 mL premix  Status:  Discontinued        1,000 mg 200 mL/hr over 60 Minutes Intravenous  Once 08/10/22 0825 08/10/22 0828   08/10/22 0830  vancomycin (VANCOREADY) IVPB 1500 mg/300 mL        1,500 mg 150 mL/hr over 120 Minutes Intravenous  Once 08/10/22 0828 08/10/22 1236        Metastatic rectal adenocarcinoma: 11/30/2020 - rectal biopsies confirm adenocarcinoma  12/13/2020 - descending loop colostomy, Dr. Harlow Asa S/p neoadjuvant FOLFOX chemotherapy 08/28/2021- open left hepatectomy Dr. [name] 10/2021- ablation of a right liver metastasis  S/p chemotherapy and radiation therapy of rectal mass S/p radiation therapy of lung lesions 02/04/2022 - robotic APR, parastomal hernia repair Dr. [name]  S/p radiation therapy of lung lesions 10-04/2022 IR drainage of pre-sacral collections, injection studies not significant for fistula to bladder at that time. 07/31/2022 S/p single dose of radiation therapy to the brain 08/05/2022 - left frontal craniotomy for tumor resection, Dr. [name]  Patient currently  being considered for Y90 treatments of liver metastasis by IR Dr. Kathlene Cote    Assessment/Plan 52 y/o M undergoing treatment for oligometastatic rectal cancer (see above treatment timeline) currently with 2 visible liver metastases and a solitary lesion in the right upper lobe of the lung who presents in septic shock. 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 Foley in place. Urology has seen. Appreciate recs. +Bcx's for E. Coli. On abx.  Perineal wound clean on admission without signs of infection and he has a non-obstructive bowel gas pattern. No emergent surgical needs today.  The fluid in the presacral space may be phlegmon or recurrent cancer. If he does not improve, would ask IR to evaluate for potential aspiration, drain, or biopsy.  Appreciate CCM mgmt of septic shock. He is now off pressors. Will discuss with md but anticipate we will follow peripherally moving forward. Please call back with any questions, concerns, or changes. Timing of repeat CT scan per Urology.   FEN - On Reg  diet, IVF per CCM VTE - SCD's. Chemical VTE held due to thrombocytopenia  ID - On Rocephin. E. Coli UTI/Bacteremia.   I reviewed nursing notes, hospitalist notes, last 24 h vitals and pain scores, last 48 h intake and output, last 24 h labs and trends, and last 24 h imaging results.   LOS: 2 days    Jillyn Ledger , [facility] 08/12/2022, 8:25 AM Please see Amion for pager number during day hours 7:00am-4:30pm

## 2022-08-12 NOTE — Progress Notes (Signed)
EKG confirms new onset Afib. Amiodarone, 2g Mg+. Hold heparin due to thrombocytopenia Vasopressin preferred first pressor to limit tachycardia  Julian Hy, DO 08/12/22 8:20 AM [facility]  For contact information, see Amion. If no response to pager, please call PCCM consult pager. After hours, 7PM- 7AM, please call Elink.

## 2022-08-12 NOTE — Progress Notes (Addendum)
NAME:  James Coffey, MRN:  [id number], DOB:  [birth year], LOS: 2 ADMISSION DATE:  08/10/2022, CONSULTATION DATE:  08/10/2022 REFERRING MD:  Philip Aspen- ED, CHIEF COMPLAINT:  septic shock  History of Present Illness:  52 year old male current every day smoker  with metastatic colon cancer to the lungs, brain, and liver status post chemotherapy and left frontal lobe resection of brain tumor  that was thought to be metastatic disease on  08/05/2022. He  presents emergency department from home on 08/10/2022 with fever X 3 days.  His complex medical history is significant for diverting colostomy in the setting of fournier's gangrene s/p multiple debridements in 2022, followed by diagnosis of rectal cancer with liver metastasis s/p partial left hepatectomy in March 2023. He then underwent percutaneous ablation of a right liver tumor in early May as well as abdominal perineal resection, robotic approach 02/04/22 . He completed radiation therapy June 2023. He has a pre-sacral fluid collection that has been drained by IR on multiple occasions.  Patient had Foley catheter that was placed and was removed several days ago ( during craniotomy procedure)  and reports that he has been having dysuria and fevers for the past 3 days. Family treated his dysuria with Azo, which may explain the orange red color of his urine. He denies any specific locations of pain but says that he is having pain all over. Per EMS was hypotensive and tachycardic and was given 1 L of fluids prior to arrival. Patient alert and oriented to self only and unable to provide additional history.  Recent Craniotomy with tumor resection 08/05/2022 for suspected metastatic disease.  In the ED patient was given 30 cc per KG fluid resuscitation. Blood Cx were drawn Resp. Viral panel was done and negative for Covid and flu. He was started on Levophed  and vasopressin for hypotension. Initial lactate was 8.6, and at last check is 5.3. CT Chest Abdomen and pelvis results  are noted in full below, findings concerning for emphysematous cystitis, ascending urinary tract infection, possible fistulous connection with the urinary bladder, New patchy bilateral ground-glass opacities, favored to reflect an infectious or inflammatory etiology, increase in size of both hepatic lesions and pulmonary nodules.  Urine and Blood Cx are pending  Additional labs in the ED revealed Na 129/ BUN 47/ Creatinine 2.77/ Calcium of 7.5/ Albumin of 1.5/ AST 61/ ALT 47/ Alk Phos of 262/ Total Bili 2.2 HGB 8.3/ WBC 4.1 ( was 21 last week) , platelets 46.000, PT 19. INR 1.6 UA positive for Nitrites, small leukocytes, large HGB In the ED he received Cefapime, Flagyl, and Vancomycin. T Max 102.6  PCCM have been asked to admit to ICU for sepsis, suspected urinary tract source.  Pertinent  Medical History  Aortic ectasia Tobacco abuse Colon cancer with mets to the brain, lung in addition to the  liver Craniotomy with brain tumor resection 08/05/2022  Significant Hospital Events: Including procedures, antibiotic start and stop dates in addition to other pertinent events   2/28 Craniotomy 3/1 Discharge from [facility] after crani 3/5 Admission to [facility] with Sepsis  Interim History / Subjective:  Still has some mild abdominal pain. Down to NE 62mg. Appetite is poor.   Objective   Blood pressure 124/66, pulse (!) 103, temperature 99 F (37.2 C), temperature source Oral, resp. rate (!) 23, height '5\' 9"'$  (1.753 m), weight 78.8 kg, SpO2 100 %.        Intake/Output Summary (Last 24 hours) at 08/12/2022 0727 Last data filed at 08/12/2022  0600 Gross per 24 hour  Intake 1124.98 ml  Output 2375 ml  Net -1250.02 ml    Filed Weights   08/10/22 1401 08/11/22 0403 08/12/22 0333  Weight: 76.1 kg 74.6 kg 78.8 kg    Examination: General: chronically ill appearing man sitting up in bed in NAD HENT:  left craniotomy- no erythema or drainage Lungs:  rhales bilaterally, mild tachypnea but no accessory  muscle use or conversational dyspnea.  Cardiovascular:  S1S2, tachycardic, reg rhythm Abdomen:  ostomy with stool and gas output, pink. Mild abdominal tenderness mostly in lower abdomen.  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  Lactic acidosis due to sepsis -ok to deescalate to ceftriaxone -foley palced at admission, needs to stay until follow up imaging demonstrates resolution of infection per urology -repeat blood cultures pending -appreciate surgery's management -vasopressors PRN to maintain MAP >65 -dexamethasone  Acute respiratory failure with hypoxia- unsure if inflammatory condition due to bacteremia vs bacterial pneumonia Current every day smoker  Pulmonary mets from colon cancer; lingular lesion likely enlarging -supplemental O2 to maintain SpO2 >90% -recommend smoking cessation -defer enlarging nodule to OP follow up   AKI due to septic shock -supportive care for sepsis -recheck BMP tomorrow -strict I/O -keep foley -ensure adequate perfusion -Renally dose meds, avoid nephrotoxic meds  Hyperglycemia -SSI PRN -goal BG 140-180  Thrombocytopenia likely 2/2 sepsis -transfuse for platelets <10  -recheck CBC tomorrow  Status-post Craniotomy with tumor resection per Dr. Larose Hires 2/28 -con't keppra, dexamethasone per NS -seizure precautions -supportive care   Acute metabolic encephalopathy 2/2 sepsis; resolved -supportive care --avoid deliriogenic meds  Pain due to metastatic cancer  -dilaudid and oxycodone PRN  Goals of Care   -Palliative team have been consulted and he has requested they no  longer participate in his care.  -wife is surrogate Manufacturing systems engineer (right click and "Reselect all SmartList Selections" daily)   Diet/type:  regular DVT prophylaxis: SCD GI prophylaxis: PPI Lines: Central line>> port Foley:  Yes, and it is still needed Code Status:  full code Last date of multidisciplinary goals of care discussion [3/5- see Palliative care note ]   Labs   CBC: Recent Labs  Lab 08/05/22 1445 08/10/22 0809 08/10/22 0853 08/11/22 0336 08/11/22 1216  WBC  --  4.1  --  10.5 9.3  NEUTROABS  --  3.0  --   --   --   HGB 11.6* 8.3* 7.5* 7.2* 7.4*  HCT 34.0* 25.2* 22.0* 21.3* 22.7*  MCV  --  86.9  --  84.9 86.3  PLT  --  46*  --  16* 21*     Basic Metabolic Panel: Recent Labs  Lab 08/05/22 1445 08/10/22 0809 08/10/22 0853 08/10/22 2301 08/11/22 0336  NA 142 129* 131* 135 136  K 3.8 4.5 4.5 3.7 3.5  CL  --  98 99 103 102  CO2  --  17*  --  20* 19*  GLUCOSE  --  132* 129* 175* 150*  BUN  --  47* 37* 36* 35*  CREATININE  --  2.77* 2.70* 1.73* 1.56*  CALCIUM  --  7.5*  --  8.3* 8.5*  MG  --  1.5*  --  3.1* 3.1*  PHOS  --  3.0  --   --  4.9*    GFR: Estimated Creatinine Clearance: 54.8 mL/min (A) (by C-G formula based on SCr of 1.56 mg/dL (H)). Recent Labs  Lab 08/10/22 0809 08/10/22 1007 08/11/22 0336 08/11/22 1216  PROCALCITON 130.49  --  75.91  --   WBC 4.1  --  10.5 9.3  LATICACIDVEN 8.6* 5.3* 4.5*  --      Liver Function Tests: Recent Labs  Lab 08/10/22 0809 08/11/22 0336  AST 61* 472*  ALT 47* 340*  ALKPHOS 262* 109  BILITOT 2.2* 3.5*  PROT 4.5* 5.8*  ALBUMIN 1.5* 3.3*    No results for input(s): "LIPASE", "AMYLASE" in the last 168 hours. Recent Labs  Lab 08/11/22 0336  AMMONIA 29     Critical care time:      This patient is critically ill with multiple organ system failure which requires frequent high complexity decision making, assessment, support, evaluation, and titration of therapies. This was  completed through the application of advanced monitoring technologies and extensive interpretation of multiple databases. During this encounter critical care time was devoted to patient care services described in this note for 42 minutes.  Julian Hy, DO 08/12/22 7:49 AM [facility]  For contact information, see Amion. If no response to pager, please call PCCM consult pager. After hours, 7PM- 7AM, please call Elink.   Tele now with irreg rhythm, appears to be Afib. Will confirm on 12-lead. Due to low platelets, not currently a good candidate for [facility]. Julian Hy, DO 08/12/22 7:53 AM [facility]  For contact information, see Amion. If no response to pager, please call PCCM consult pager. After hours, 7PM- 7AM, please call Elink.

## 2022-08-12 NOTE — Progress Notes (Signed)
Brief Palliative Medicine Progress Note:  PMT following peripherally for needs/decline.  Medical records reviewed including progress notes, labs, imaging.   Patient was scheduled for outpatient Palliative Care at [facility] on 08/12/22 - he was not able to make this appointment due to admission. Discussed patient's case in detail with outpatient Palliative Care provider, Alda Lea, NP. She will follow up with patient after discharge for ongoing support, symptom management needs, as well as GOC as able.   PMT will continue to follow peripherally as patient is at high risk of decline during admission. If there are any imminent needs please call the service directly. Family also has PMT contact information should further needs arise.  Thank you for allowing PMT to assist in the care of this patient.  Tenya Araque M. Tamala Julian [facility] Palliative Medicine Team Team Phone: [phone] NO CHARGE

## 2022-08-13 ENCOUNTER — Inpatient Hospital Stay (HOSPITAL_COMMUNITY): Payer: Medicaid Other

## 2022-08-13 DIAGNOSIS — D696 Thrombocytopenia, unspecified: Secondary | ICD-10-CM

## 2022-08-13 DIAGNOSIS — C775 Secondary and unspecified malignant neoplasm of intrapelvic lymph nodes: Secondary | ICD-10-CM

## 2022-08-13 DIAGNOSIS — I4891 Unspecified atrial fibrillation: Secondary | ICD-10-CM | POA: Diagnosis not present

## 2022-08-13 DIAGNOSIS — C20 Malignant neoplasm of rectum: Secondary | ICD-10-CM

## 2022-08-13 DIAGNOSIS — A419 Sepsis, unspecified organism: Secondary | ICD-10-CM | POA: Diagnosis not present

## 2022-08-13 DIAGNOSIS — N3080 Other cystitis without hematuria: Secondary | ICD-10-CM | POA: Diagnosis not present

## 2022-08-13 DIAGNOSIS — K611 Rectal abscess: Secondary | ICD-10-CM | POA: Diagnosis not present

## 2022-08-13 DIAGNOSIS — R7881 Bacteremia: Secondary | ICD-10-CM | POA: Insufficient documentation

## 2022-08-13 DIAGNOSIS — B9620 Unspecified Escherichia coli [E. coli] as the cause of diseases classified elsewhere: Secondary | ICD-10-CM | POA: Insufficient documentation

## 2022-08-13 DIAGNOSIS — R6521 Severe sepsis with septic shock: Secondary | ICD-10-CM | POA: Diagnosis not present

## 2022-08-13 LAB — URINE CULTURE: Culture: 50000 — AB

## 2022-08-13 LAB — BASIC METABOLIC PANEL
Anion gap: 10 (ref 5–15)
BUN: 43 mg/dL — ABNORMAL HIGH (ref 6–20)
CO2: 22 mmol/L (ref 22–32)
Calcium: 8.5 mg/dL — ABNORMAL LOW (ref 8.9–10.3)
Chloride: 106 mmol/L (ref 98–111)
Creatinine, Ser: 0.87 mg/dL (ref 0.61–1.24)
GFR, Estimated: >60 mL/min (ref >60)
Glucose, Bld: 275 mg/dL — ABNORMAL HIGH (ref 70–99)
Potassium: 3.6 mmol/L (ref 3.5–5.1)
Sodium: 138 mmol/L (ref 135–145)

## 2022-08-13 LAB — CBC
HCT: 22.7 % — ABNORMAL LOW (ref 39.0–52.0)
Hemoglobin: 7.6 g/dL — ABNORMAL LOW (ref 13.0–17.0)
MCH: 28.7 pg (ref 26.0–34.0)
MCHC: 33.5 g/dL (ref 30.0–36.0)
MCV: 85.7 fL (ref 80.0–100.0)
Platelets: 20 10*3/uL — CL (ref 150–400)
RBC: 2.65 MIL/uL — ABNORMAL LOW (ref 4.22–5.81)
RDW: 22.5 % — ABNORMAL HIGH (ref 11.5–15.5)
WBC: 3.6 10*3/uL — ABNORMAL LOW (ref 4.0–10.5)
nRBC: 0 % (ref 0.0–0.2)

## 2022-08-13 LAB — LEGIONELLA PNEUMOPHILA SEROGP 1 UR AG: L. pneumophila Serogp 1 Ur Ag: NEGATIVE

## 2022-08-13 LAB — CULTURE, BLOOD (ROUTINE X 2)
Special Requests: ADEQUATE
Special Requests: ADEQUATE

## 2022-08-13 LAB — GLUCOSE, CAPILLARY
Glucose-Capillary: 269 mg/dL — ABNORMAL HIGH (ref 70–99)
Glucose-Capillary: 258 mg/dL — ABNORMAL HIGH (ref 70–99)
Glucose-Capillary: 257 mg/dL — ABNORMAL HIGH (ref 70–99)
Glucose-Capillary: 217 mg/dL — ABNORMAL HIGH (ref 70–99)

## 2022-08-13 MED ORDER — IOHEXOL 300 MG/ML  SOLN
50.0000 mL | Freq: Once | INTRAMUSCULAR | Status: AC | PRN
  Administered 2022-08-13: 50 mL via INTRATHECAL

## 2022-08-13 MED ORDER — POTASSIUM CHLORIDE CRYS ER 20 MEQ PO TBCR
40.0000 meq | EXTENDED_RELEASE_TABLET | Freq: Once | ORAL | Status: DC
  Filled 2022-08-13: qty 2

## 2022-08-13 MED ORDER — SODIUM CHLORIDE 0.9 % IV SOLN
2.0000 g | INTRAVENOUS | Status: DC
  Administered 2022-08-13: 2 g via INTRAVENOUS
  Filled 2022-08-13: qty 2000

## 2022-08-13 MED ORDER — INSULIN DETEMIR 100 UNIT/ML ~~LOC~~ SOLN
10.0000 [IU] | Freq: Two times a day (BID) | SUBCUTANEOUS | Status: DC
  Administered 2022-08-13 – 2022-08-15 (×5): 10 [IU] via SUBCUTANEOUS
  Filled 2022-08-13 (×6): qty 0.1

## 2022-08-13 MED ORDER — INSULIN ASPART 100 UNIT/ML IJ SOLN
3.0000 [IU] | Freq: Three times a day (TID) | INTRAMUSCULAR | Status: DC
  Administered 2022-08-13 – 2022-08-17 (×10): 3 [IU] via SUBCUTANEOUS

## 2022-08-13 MED ORDER — SODIUM CHLORIDE 0.9 % IV SOLN
3.0000 g | Freq: Four times a day (QID) | INTRAVENOUS | Status: DC
  Administered 2022-08-13 – 2022-08-19 (×23): 3 g via INTRAVENOUS
  Filled 2022-08-13 (×23): qty 8

## 2022-08-13 MED ORDER — IOHEXOL 350 MG/ML SOLN
75.0000 mL | Freq: Once | INTRAVENOUS | Status: AC | PRN
  Administered 2022-08-13: 75 mL via INTRAVENOUS

## 2022-08-13 MED ORDER — POTASSIUM CHLORIDE 20 MEQ PO PACK
40.0000 meq | PACK | Freq: Once | ORAL | Status: AC
  Administered 2022-08-13: 40 meq via ORAL
  Filled 2022-08-13: qty 2

## 2022-08-13 NOTE — Progress Notes (Signed)
PT Cancellation Note  Patient Details Name: James Coffey MRN: [id number] DOB: August 14, 1970   Cancelled Treatment:    Reason Eval/Treat Not Completed: Patient at procedure or test/unavailable - at CT, will check back as able.   Stacie Glaze, PT DPT Acute Rehabilitation Services Pager [phone]  Office [phone]    Louis Matte 08/13/2022, 3:25 PM

## 2022-08-13 NOTE — Consult Note (Signed)
[name] for Infectious Disease    Date of Admission:  08/10/2022     Reason for Consult: Urinary tract infection and vesicular fistula     Referring Physician: Dr. Carlis Abbott  Current antibiotics: Ampicillin  ASSESSMENT:    52 y.o. male admitted with:  E. coli and Enterococcus faecalis urinary tract infection: With emphysematous cystitis and concern for ascending infection.  Imaging also concerning for fistulous connection to the bladder from presacral fluid and gas collection (4.2 x 2.7 cm). E. coli bacteremia: Secondary to #1. Colon cancer: With metastases to the lungs, brain, liver.  Status post chemotherapy and left frontal lobe resection 08/05/2022. Septic shock: Secondary to #1 and #2. Acute kidney injury: Presented with creatinine 2.77 which has normalized to 0.87 today. Goals of care: Palliative team consulted but patient requested they no longer participate in his care.  RECOMMENDATIONS:    Change antibiotics to Unasyn for anaerobic coverage in the setting of suspected fistula from presacral fluid collection This is a situation where antibiotics alone are not going to solve the dilemma if there is an infected presacral fluid collection and fistula to the bladder complicated by his widespread metastatic disease Will obtain CT cystogram to evaluate for fistula Based on this result, will have IR assess for drain placement May need urology and/or surgery to have more input on management of fistula if present   Principal Problem:   Sepsis ([place])   MEDICATIONS:    Scheduled Meds:  Chlorhexidine Gluconate Cloth  6 each Topical Daily   dexamethasone  4 mg Oral Q12H   insulin aspart  0-15 Units Subcutaneous TID WC   insulin aspart  0-5 Units Subcutaneous QHS   insulin aspart  3 Units Subcutaneous TID WC   insulin detemir  10 Units Subcutaneous BID   ipratropium-albuterol  3 mL Nebulization Q6H   levETIRAcetam  500 mg Oral BID   pantoprazole  40 mg Oral Daily    Continuous Infusions:  sodium chloride 10 mL/hr at 08/13/22 1200   amiodarone 30 mg/hr (08/13/22 1200)   ampicillin-sulbactam (UNASYN) IV     norepinephrine (LEVOPHED) Adult infusion Stopped (08/12/22 0719)   vasopressin Stopped (08/13/22 1100)   PRN Meds:.acetaminophen, albuterol, docusate sodium, guaiFENesin-codeine, HYDROmorphone (DILAUDID) injection, ondansetron (ZOFRAN) IV, mouth rinse, oxyCODONE-acetaminophen, polyethylene glycol  HPI:    James Coffey is a 52 y.o. male with complex past medical history including metastatic colon cancer with metastases to the lungs, brain, liver status postchemotherapy and left frontal lobe resection of tumor that was felt to be due to metastatic disease.  This was completed on 08/05/2022 by Dr. [name].  He presented to the emergency department on 08/10/2022 with fever x 3 days.  In the emergency department, he was given IV fluids for volume resuscitation.  Blood cultures were obtained.  He required pressors for blood pressure support.  His blood cultures have grown E. coli.  Urine cultures have grown E. coli and Enterococcus faecalis.  Repeat blood cultures obtained 2 days ago are no growth.  He underwent imaging upon admission with a CT of the head that showed postoperative changes as expected.  CT chest, abdomen, pelvis was also obtained which showed gas in the urinary bladder with perivesicular stranding concerning for emphysematous pyelonephritis.  There was also a fluid and gas collection in the presacral space with concern for possible fistulous connection with the urinary bladder.  He was seen by urology on 08/11/2022 in consultation.  They recommended leaving his Foley catheter in place until  repeat imaging shows resolution and continuing antibiotics.  They did not comment on the fistula in particular.  General surgery also consulted on the patient.  They did not recommend any IR drainage of the presacral collection nor management of his fistula in  particular as well.  Regarding his presacral fluid collection, this has been followed in the past by IR.  Prior injection studies have not shown significant concern for fistula to the bladder at that time.   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 rate (!) 23, height '5\' 9"'$  (1.753 m), weight 78.8 kg, SpO2 99 %. Body mass index is 25.65 kg/m.  Physical Exam   Lab Results: Lab Results  Component Value Date   WBC 3.6 (L) 08/13/2022   HGB 7.6 (L) 08/13/2022   HCT 22.7 (L) 08/13/2022   MCV 85.7 08/13/2022   PLT 20 (LL) 08/13/2022    Lab Results  Component Value Date   NA 138 08/13/2022   K 3.6 08/13/2022   CO2 22 08/13/2022   GLUCOSE 275 (H) 08/13/2022   BUN 43 (H) 08/13/2022   CREATININE 0.87 08/13/2022   CALCIUM 8.5 (L) 08/13/2022   GFRNONAA >60 08/13/2022    Lab Results  Component Value Date   ALT 340 (H) 08/11/2022   AST 472 (H) 08/11/2022   ALKPHOS 109 08/11/2022   BILITOT 3.5 (H) 08/11/2022    No results found for: "CRP"  No results found for: "ESRSEDRATE"  I have reviewed the micro and lab results in Epic.  Imaging: No results found.   Imaging independently reviewed in Epic.  Raynelle [facility] Infectious [facility] [phone] pager 08/13/2022, 1:29 PM

## 2022-08-13 NOTE — Progress Notes (Signed)
NAME:  James Coffey, MRN:  [id number], DOB:  08/16/1970, LOS: 3 ADMISSION DATE:  08/10/2022, CONSULTATION DATE:  08/10/2022 REFERRING MD:  Philip Aspen- ED, CHIEF COMPLAINT:  septic shock  History of Present Illness:  52 year old male current every day smoker  with metastatic colon cancer to the lungs, brain, and liver status post chemotherapy and left frontal lobe resection of brain tumor  that was thought to be metastatic disease on  08/05/2022. He  presents emergency department from home on 08/10/2022 with fever X 3 days.  His complex medical history is significant for diverting colostomy in the setting of fournier's gangrene s/p multiple debridements in 2022, followed by diagnosis of rectal cancer with liver metastasis s/p partial left hepatectomy in March 2023. He then underwent percutaneous ablation of a right liver tumor in early May as well as abdominal perineal resection, robotic approach 02/04/22 . He completed radiation therapy June 2023. He has a pre-sacral fluid collection that has been drained by IR on multiple occasions.  Patient had Foley catheter that was placed and was removed several days ago ( during craniotomy procedure)  and reports that he has been having dysuria and fevers for the past 3 days. Family treated his dysuria with Azo, which may explain the orange red color of his urine. He denies any specific locations of pain but says that he is having pain all over. Per EMS was hypotensive and tachycardic and was given 1 L of fluids prior to arrival. Patient alert and oriented to self only and unable to provide additional history.  Recent Craniotomy with tumor resection 08/05/2022 for suspected metastatic disease.  In the ED patient was given 30 cc per KG fluid resuscitation. Blood Cx were drawn Resp. Viral panel was done and negative for Covid and flu. He was started on Levophed  and vasopressin for hypotension. Initial lactate was 8.6, and at last check is 5.3. CT Chest Abdomen and pelvis results  are noted in full below, findings concerning for emphysematous cystitis, ascending urinary tract infection, possible fistulous connection with the urinary bladder, New patchy bilateral ground-glass opacities, favored to reflect an infectious or inflammatory etiology, increase in size of both hepatic lesions and pulmonary nodules.  Urine and Blood Cx are pending  Additional labs in the ED revealed Na 129/ BUN 47/ Creatinine 2.77/ Calcium of 7.5/ Albumin of 1.5/ AST 61/ ALT 47/ Alk Phos of 262/ Total Bili 2.2 HGB 8.3/ WBC 4.1 ( was 21 last week) , platelets 46.000, PT 19. INR 1.6 UA positive for Nitrites, small leukocytes, large HGB In the ED he received Cefapime, Flagyl, and Vancomycin. T Max 102.6  PCCM have been asked to admit to ICU for sepsis, suspected urinary tract source.  Pertinent  Medical History  Aortic ectasia Tobacco abuse Colon cancer with mets to the brain, lung in addition to the  liver Craniotomy with brain tumor resection 08/05/2022  Significant Hospital Events: Including procedures, antibiotic start and stop dates in addition to other pertinent events   2/28 Craniotomy 3/1 Discharge from [facility] after crani 3/5 Admission to [facility] with Sepsis  Interim History / Subjective:  Abdominal pain improving.  Has not been walking much; does not want to until later today. Remains on low-dose pressors .  Objective   Blood pressure 101/73, pulse 77, temperature 97.7 F (36.5 C), temperature source Oral, resp. rate 19, height '5\' 9"'$  (1.753 m), weight 78.8 kg, SpO2 100 %.        Intake/Output Summary (Last 24 hours) at 08/13/2022 [phone]  Last data filed at 08/13/2022 0700 Gross per 24 hour  Intake 1299.45 ml  Output 1700 ml  Net -400.55 ml    Filed Weights   08/10/22 1401 08/11/22 0403 08/12/22 0333  Weight: 76.1 kg 74.6 kg 78.8 kg    Examination: General: chronically ill appearing man lying in bed in NAD HENT:  left crani scar without bleeding or erythema Lungs:  rhales  improved today, breathing comfortably on   Cardiovascular:  S1S2, tachycardic, irreg rhythm Abdomen:  ostomy with stool, abd soft, NT Extremities:  no peripheral edema, no cyanosis Neuro: awake, alert, moving all extremities, answering questions appropriately  GU: urine clearer today  Urine culture: pan-sensitive E coli, E faecalis Blood cultures: E coli 4/4 Repeat blood cultures NGTD  BUN 43 Cr 0.84 WBC 3.6 H/H 7.6/22.7 Platelets 20  BG 200s   Resolved Hospital Problem list   Hyponatremia Acute metabolic encephalopathy 2/2 sepsis  Assessment & Plan:  Septic shock due to E coli bacteremia and E Coli & Enterococcus UTI with emphysematous cystitis, pyelonephritis 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 Will consult ID for recommendations for antibiotic duration with possible fistula and fluid collection. No current plans for sampling fluid collection.  -vasopressors to maintain MAP >65 -dexamethasone  Acute respiratory failure with hypoxia- unsure if inflammatory condition due to bacteremia vs bacterial pneumonia Current every day smoker  Pulmonary mets from colon cancer; lingular lesion likely enlarging -wean supplemental O2 -pulmonary hygiene -OOB mobility -recommend quitting smoking  New onset Afib with RVR -amiodarone -unable to [facility] due to severe thrombocytopenia; discussed risk of stroke or other clots with him today  AKI due to septic shock; resolved -maintain adequate perfusion -strict I/O -renally dose meds, avoid nephrotoxic meds -con't foley  Hyperglycemia, uncontrolled -adding detemir 10 units BID -adding mealtime insulin 3 units TIDAC -SSI PRN -goal BG 140-180  Thrombocytopenia likely 2/2 sepsis -transfuse for platelets <10,  monitor periodically -unable to give DVT prophylaxis due to high bleeding risk, but he is at very high risk of DVT (cancer, critical illness, recent surgery, immobility)  Status-post Craniotomy with tumor resection per Dr. Larose Hires 2/28 -con't keppra and dexamethsone per NS -seizure precautions  Pain due to metastatic cancer  -dilaudid and oxycodone PRN  Goals of Care   -Palliative team have been consulted and he has requested they no longer participate in his care.  -wife is surrogate Media planner  We discussed how critical it is for him to walk more. Con't PT. D/w RN as well.    Best Practice (right click and "Reselect all SmartList Selections" daily)   Diet/type:  regular DVT prophylaxis: SCD GI prophylaxis: PPI Lines: Central line>> port Foley:  Yes, and it is still needed Code Status:  full code Last date of multidisciplinary goals of care discussion [3/5- see Palliative care note ]   Labs   CBC: Recent Labs  Lab 08/10/22 0809 08/10/22 0853 08/11/22 0336 08/11/22 1216 08/13/22 0633  WBC 4.1  --  10.5 9.3 3.6*  NEUTROABS 3.0  --   --   --   --   HGB 8.3* 7.5* 7.2* 7.4* 7.6*  HCT 25.2* 22.0* 21.3* 22.7* 22.7*  MCV 86.9  --  84.9 86.3 85.7  PLT 46*  --  16* 21* 20*     Basic Metabolic Panel: Recent Labs  Lab  08/10/22 0809 08/10/22 [id number] 08/10/22 2301 08/11/22 0336 08/13/22 0633  NA 129* 131* 135 136 138  K 4.5 4.5 3.7 3.5 3.6  CL 98 99 103 102 106  CO2 17*  --  20* 19* 22  GLUCOSE 132* 129* 175* 150* 275*  BUN 47* 37* 36* 35* 43*  CREATININE 2.77* 2.70* 1.73* 1.56* 0.87  CALCIUM 7.5*  --  8.3* 8.5* 8.5*  MG 1.5*  --  3.1* 3.1*  --   PHOS 3.0  --   --  4.9*  --     GFR: Estimated Creatinine Clearance: 98.2 mL/min (by C-G formula based on SCr of 0.87 mg/dL). Recent Labs  Lab 08/10/22 0809 08/10/22 1007 08/11/22 0336 08/11/22 1216 08/13/22 0633  PROCALCITON 130.49  --  75.91  --   --   WBC 4.1  --  10.5 9.3 3.6*  LATICACIDVEN 8.6* 5.3* 4.5*   --   --      Liver Function Tests: Recent Labs  Lab 08/10/22 0809 08/11/22 0336  AST 61* 472*  ALT 47* 340*  ALKPHOS 262* 109  BILITOT 2.2* 3.5*  PROT 4.5* 5.8*  ALBUMIN 1.5* 3.3*    No results for input(s): "LIPASE", "AMYLASE" in the last 168 hours. Recent Labs  Lab 08/11/22 0336  AMMONIA 29     Critical care time:      This patient is critically ill with multiple organ system failure which requires frequent high complexity decision making, assessment, support, evaluation, and titration of therapies. This was completed through the application of advanced monitoring technologies and extensive interpretation of multiple databases. During this encounter critical care time was devoted to patient care services described in this note for 36 minutes.  Julian Hy, DO 08/13/22 9:52 AM [facility]  For contact information, see Amion. If no response to pager, please call PCCM consult pager. After hours, 7PM- 7AM, please call [facility].

## 2022-08-13 NOTE — Inpatient Diabetes Management (Signed)
Inpatient Diabetes Program Recommendations  AACE/ADA: New Consensus Statement on Inpatient Glycemic Control (2015)  Target Ranges:  Prepandial:   less than 140 mg/dL      Peak postprandial:   less than 180 mg/dL (1-2 hours)      Critically ill patients:  140 - 180 mg/dL    Latest Reference Range & Units 08/11/22 23:28 08/12/22 03:21 08/12/22 07:22 08/12/22 11:44 08/12/22 15:06 08/12/22 17:46 08/12/22 22:09  Glucose-Capillary 70 - 99 mg/dL 151 (H)  3 units Novolog  140 (H)  2 units Novolog 158 (H)  3 units Novolog 234 (H)  5 units Novolog 257 (H) 355 (H)  15 units Novolog 256 (H)  3 units Novolog  (H): Data is abnormally high  Latest Reference Range & Units 08/13/22 07:50  Glucose-Capillary 70 - 99 mg/dL 269 (H)  8 units Novolog   (H): Data is abnormally high     Septic shock due to E coli bacteremia and UTI with emphysematous cystitis, pyelonephritis 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  CBG 269 this AM.  May consider adding Basal insulin to in-hospital regimen while pt remains on Decadron:  1. Semglee 12 units Daily (0.15 units/kg)  2. May also consider increasing the frequency of the Novolog SSI back to Q4 hours while pt remains on Steroids    --Will follow patient during hospitalization--  Wyn Quaker RN, MSN, [facility] Diabetes Coordinator Inpatient Glycemic Control Team Team Pager: [phone] (8a-5p)

## 2022-08-14 DIAGNOSIS — A419 Sepsis, unspecified organism: Secondary | ICD-10-CM | POA: Diagnosis not present

## 2022-08-14 DIAGNOSIS — C20 Malignant neoplasm of rectum: Secondary | ICD-10-CM | POA: Diagnosis not present

## 2022-08-14 DIAGNOSIS — N12 Tubulo-interstitial nephritis, not specified as acute or chronic: Secondary | ICD-10-CM | POA: Diagnosis not present

## 2022-08-14 DIAGNOSIS — N3080 Other cystitis without hematuria: Secondary | ICD-10-CM | POA: Diagnosis not present

## 2022-08-14 DIAGNOSIS — R6521 Severe sepsis with septic shock: Secondary | ICD-10-CM | POA: Diagnosis not present

## 2022-08-14 LAB — CBC
HCT: 30.2 % — ABNORMAL LOW (ref 39.0–52.0)
Hemoglobin: 9.4 g/dL — ABNORMAL LOW (ref 13.0–17.0)
MCH: 28 pg (ref 26.0–34.0)
MCHC: 31.1 g/dL (ref 30.0–36.0)
MCV: 89.9 fL (ref 80.0–100.0)
Platelets: 38 10*3/uL — ABNORMAL LOW (ref 150–400)
RBC: 3.36 MIL/uL — ABNORMAL LOW (ref 4.22–5.81)
RDW: 23 % — ABNORMAL HIGH (ref 11.5–15.5)
WBC: 5.7 10*3/uL (ref 4.0–10.5)
nRBC: 0.5 % — ABNORMAL HIGH (ref 0.0–0.2)

## 2022-08-14 LAB — BASIC METABOLIC PANEL
Anion gap: 12 (ref 5–15)
BUN: 34 mg/dL — ABNORMAL HIGH (ref 6–20)
CO2: 24 mmol/L (ref 22–32)
Calcium: 9 mg/dL (ref 8.9–10.3)
Chloride: 107 mmol/L (ref 98–111)
Creatinine, Ser: 0.91 mg/dL (ref 0.61–1.24)
GFR, Estimated: >60 mL/min (ref >60)
Glucose, Bld: 170 mg/dL — ABNORMAL HIGH (ref 70–99)
Potassium: 4.2 mmol/L (ref 3.5–5.1)
Sodium: 143 mmol/L (ref 135–145)

## 2022-08-14 LAB — GLUCOSE, CAPILLARY
Glucose-Capillary: 139 mg/dL — ABNORMAL HIGH (ref 70–99)
Glucose-Capillary: 157 mg/dL — ABNORMAL HIGH (ref 70–99)
Glucose-Capillary: 274 mg/dL — ABNORMAL HIGH (ref 70–99)
Glucose-Capillary: 230 mg/dL — ABNORMAL HIGH (ref 70–99)

## 2022-08-14 LAB — MAGNESIUM: Magnesium: 1.9 mg/dL (ref 1.7–2.4)

## 2022-08-14 LAB — PHOSPHORUS: Phosphorus: 4 mg/dL (ref 2.5–4.6)

## 2022-08-14 NOTE — Progress Notes (Signed)
[facility] for Infectious Disease  Date of Admission:  08/10/2022           Reason for visit: Follow up on emphysematous cystitis and bacteremia  Current antibiotics: Unasyn    ASSESSMENT:    52 y.o. male admitted with:  E. coli and Enterococcus faecalis urinary tract infection: With emphysematous cystitis and concern for ascending infection.  CT cystogram obtained 08/13/2022 was negative for fistula and necrotic presacral mass was favored to represent residual/recurrent rectal tumor rather than fluid collection/abscess. E. coli bacteremia: Secondary to #1. 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  Status postchemotherapy and left frontal lobe resection 08/05/2022. Septic shock: Secondary to #1 and #2.  Currently off pressors.  RECOMMENDATIONS:    Continue Unasyn IV while patient is admitted Would transition to Augmentin at discharge Plan to complete 2 weeks of IV antibiotics in the setting of bacteremia with complicated UTI End date for antibiotics = 08/27/2022 Follow-up with urology and colorectal surgery as an outpatient Follow-up with palliative care Will sign off, please call as needed.   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)   E coli bacteremia   Emphysematous cystitis   Septic shock (HCC)    MEDICATIONS:    Scheduled Meds:  Chlorhexidine Gluconate Cloth  6 each Topical Daily   dexamethasone  4 mg Oral Q12H   insulin aspart  0-15 Units Subcutaneous TID WC   insulin aspart  0-5 Units Subcutaneous QHS   insulin aspart  3 Units Subcutaneous TID WC   insulin detemir  10 Units Subcutaneous BID   ipratropium-albuterol  3 mL Nebulization Q6H   levETIRAcetam  500 mg Oral BID   pantoprazole  40 mg Oral Daily   Continuous Infusions:  sodium chloride 10 mL/hr at 08/14/22 0700   amiodarone 30 mg/hr (08/14/22 0700)   ampicillin-sulbactam  (UNASYN) IV Stopped (08/14/22 0342)   norepinephrine (LEVOPHED) Adult infusion Stopped (08/12/22 0719)   vasopressin Stopped (08/13/22 1852)   PRN Meds:.acetaminophen, albuterol, docusate sodium, guaiFENesin-codeine, HYDROmorphone (DILAUDID) injection, ondansetron (ZOFRAN) IV, mouth rinse, oxyCODONE-acetaminophen, polyethylene glycol  SUBJECTIVE:   24 hour events:  No acute events overnight noted Tmax 97.9 No new labs this morning CT cystogram of the abdomen/pelvis was notable for cystitis and a necrotic presacral mass at the site of prior fluid collection.  This is favored to represent residual/recurrent tumor rather than abscess. There was no CT evidence of a fistula   No new complaints.  He is off pressors.  Review of Systems  All other systems reviewed and are negative.     OBJECTIVE:   Blood pressure 109/85, pulse 95, temperature 97.9 F (36.6 C), temperature source Oral, resp. rate (!) 29, height '5\' 9"'$  (1.753 m), weight 78.5 kg, SpO2 100 %. Body mass index is 25.56 kg/m.  Physical Exam Constitutional:      Appearance: Normal appearance.  HENT:     Head:     Comments: Craniotomy incision with no redness or drainage Eyes:     Extraocular Movements: Extraocular movements intact.     Conjunctiva/sclera: Conjunctivae normal.  Pulmonary:     Effort: Pulmonary effort is normal. No respiratory distress.  Abdominal:     General: There is no distension.     Palpations: Abdomen is soft.     Comments: Colostomy in place  Musculoskeletal:     Cervical back: Normal range of motion  and neck supple.  Skin:    General: Skin is warm and dry.  Neurological:     General: No focal deficit present.     Mental Status: He is alert and oriented to person, place, and time.  Psychiatric:        Mood and Affect: Mood normal.        Behavior: Behavior normal.      Lab Results: Lab Results  Component Value Date   WBC 3.6 (L) 08/13/2022   HGB 7.6 (L) 08/13/2022   HCT 22.7 (L)  08/13/2022   MCV 85.7 08/13/2022   PLT 20 (LL) 08/13/2022    Lab Results  Component Value Date   NA 138 08/13/2022   K 3.6 08/13/2022   CO2 22 08/13/2022   GLUCOSE 275 (H) 08/13/2022   BUN 43 (H) 08/13/2022   CREATININE 0.87 08/13/2022   CALCIUM 8.5 (L) 08/13/2022   GFRNONAA >60 08/13/2022    Lab Results  Component Value Date   ALT 340 (H) 08/11/2022   AST 472 (H) 08/11/2022   ALKPHOS 109 08/11/2022   BILITOT 3.5 (H) 08/11/2022    No results found for: "CRP"  No results found for: "ESRSEDRATE"   I have reviewed the micro and lab results in Epic.  Imaging: CT CYSTOGRAM ABD/PELVIS  Result Date: 08/13/2022 CLINICAL DATA:  Metastatic colon cancer, evaluate for vesicointestinal fistula EXAM: CT CYSTOGRAM (CT ABDOMEN AND PELVIS WITH CONTRAST) TECHNIQUE: Multi-detector CT imaging through the abdomen and pelvis was performed after dilute contrast had been introduced into the bladder for the purposes of performing CT cystography. RADIATION DOSE REDUCTION: This exam was performed according to the departmental dose-optimization program which includes automated exposure control, adjustment of the mA and/or kV according to patient size and/or use of iterative reconstruction technique. CONTRAST:  14m OMNIPAQUE IOHEXOL 350 MG/ML SOLN, 540mOMNIPAQUE IOHEXOL 300 MG/ML SOLN COMPARISON:  CT abdomen/pelvis without contrast dated 08/10/2022 and 07/21/2022. CT abdomen/pelvis with contrast dated 07/13/2022. FINDINGS: Lower chest: Multifocal ground-glass opacities in the lungs bilaterally, perihilar distribution with subpleural sparing (series 5/image 1), include visualized. This is new from the recent prior and suggests interstitial edema. Small bilateral pleural effusions, left greater than right. Hepatobiliary: Two large central hepatic metastases measuring up to 8.0 cm (series 3/image 13). Additional smaller inferior right hepatic lobe lesion measures 3.2 cm. This appearance is progressive from prior  enhanced study. Gallbladder is decompressed. No intrahepatic or extrahepatic duct dilatation. Pancreas: Within normal limits. Spleen: Within normal limits. Adrenals/Urinary Tract: Adrenal glands are within normal limits. Kidneys are within normal limits, noting reflux of contrast into the right renal collecting system. Mild fullness of the right renal collecting system without frank hydronephrosis. Mildly trabeculated bladder with indwelling Foley catheter and injected contrast with nondependent gas. Recent bladder pneumatosis is not well visualized on the current study, but there is irregular bladder wall thickening superiorly (series 8/image 21), related to the patient's suspected cystitis. Notably, on delayed imaging, there is no extraluminal reflux of contrast into a loop of small bowel, including adjacent small bowel (series 8/image 15) or the necrotic presacral mass (described below). Stomach/Bowel: Stomach is within normal limits. No evidence of bowel obstruction. Appendix is not discretely visualized. Status post left hemicolectomy with left mid abdominal colostomy. Large bowel containing parastomal hernia (series 3/image 85), unchanged. 4.2 x 6.4 cm necrotic presacral mass (series 3/image 86), at the site of prior fluid collection status post drainage, although currently favoring residual/recurrent rectal tumor rather than a fluid collection/abscess. Persistent nondependent gas  within this lesion (series 3/image 36), but when correlating with prior studies this is favored to be related to external communication (series 3/image 95) rather than vesicocolonic or enterocolonic fistula. Vascular/Lymphatic: No evidence of abdominal aortic aneurysm. Atherosclerotic calcifications of the abdominal aorta and branch vessels. No suspicious abdominopelvic lymphadenopathy. Reproductive: Prostate is poorly visualized. Other: Small volume left retroperitoneal/pelvic ascites. No free air. Musculoskeletal: Visualized  osseous structures are within normal limits. IMPRESSION: No evidence of vesicointestinal fistula. Irregular bladder thickening, likely reflecting cystitis, with indwelling Foley catheter. 4.2 x 6.4 cm necrotic presacral mass, at the site of prior fluid collection status post drainage, although currently favoring residual/recurrent rectal tumor rather than a fluid collection/abscess. Progressive hepatic metastases, as above. Suspected interstitial edema, incompletely visualized. Small bilateral pleural effusions, left greater than right. Additional ancillary findings as above. Electronically Signed   By: Julian Hy M.D.   On: 08/13/2022 18:42     Imaging independently reviewed in Epic.    [facility] Infectious [facility] [phone] pager 08/14/2022, 8:09 AM

## 2022-08-14 NOTE — Progress Notes (Signed)
PT Cancellation Note  Patient Details Name: James Coffey MRN: [id number] DOB: 01/28/1971   Cancelled Treatment:    Reason Eval/Treat Not Completed: Other (comment) - Pt arrived to pt room around 1100, at that time pt requested PT come back later so he could rest. PT came back to room at 1230, pt's lunch just arrived and he would like to eat. PT to check back at a later time.  Stacie Glaze, PT DPT Acute Rehabilitation Services Pager [phone]  Office [phone]    Louis Matte 08/14/2022, 12:28 PM

## 2022-08-14 NOTE — Progress Notes (Signed)
Brief Palliative Medicine Progress Note:  PMT following peripherally for needs/decline.  Medical records reviewed including progress notes, labs, imaging.   Patient is stabilizing with pending transfer out of the ICU.  Goals remain clear for full code/full scope treatment and return home.  Patient will follow-up with outpatient palliative care in the cancer center after discharge.  PMT will continue to follow peripherally as patient is at high risk of decline during admission. If there are any imminent needs please call the service directly. Family also has PMT contact information should further needs arise.  Thank you for allowing PMT to assist in the care of this patient.  Dorthy Cooler, PA-C Palliative Medicine Team Team Phone: [phone] NO CHARGE

## 2022-08-14 NOTE — Progress Notes (Signed)
NAME:  James Coffey, MRN:  [id number], DOB:  [birth year], LOS: 4 ADMISSION DATE:  08/10/2022, CONSULTATION DATE:  08/10/2022 REFERRING MD:  Philip Aspen- ED, CHIEF COMPLAINT:  septic shock  History of Present Illness:  52 year old male current every day smoker  with metastatic colon cancer to the lungs, brain, and liver status post chemotherapy and left frontal lobe resection of brain tumor  that was thought to be metastatic disease on  08/05/2022. He  presents emergency department from home on 08/10/2022 with fever X 3 days.  His complex medical history is significant for diverting colostomy in the setting of fournier's gangrene s/p multiple debridements in 2022, followed by diagnosis of rectal cancer with liver metastasis s/p partial left hepatectomy in March 2023. He then underwent percutaneous ablation of a right liver tumor in early May as well as abdominal perineal resection, robotic approach 02/04/22 . He completed radiation therapy June 2023. He has a pre-sacral fluid collection that has been drained by IR on multiple occasions.  Patient had Foley catheter that was placed and was removed several days ago ( during craniotomy procedure)  and reports that he has been having dysuria and fevers for the past 3 days. Family treated his dysuria with Azo, which may explain the orange red color of his urine. He denies any specific locations of pain but says that he is having pain all over. Per EMS was hypotensive and tachycardic and was given 1 L of fluids prior to arrival. Patient alert and oriented to self only and unable to provide additional history.  Recent Craniotomy with tumor resection 08/05/2022 for suspected metastatic disease.  In the ED patient was given 30 cc per KG fluid resuscitation. Blood Cx were drawn Resp. Viral panel was done and negative for Covid and flu. He was started on Levophed  and vasopressin for hypotension. Initial lactate was 8.6, and at last check is 5.3. CT Chest Abdomen and pelvis results  are noted in full below, findings concerning for emphysematous cystitis, ascending urinary tract infection, possible fistulous connection with the urinary bladder, New patchy bilateral ground-glass opacities, favored to reflect an infectious or inflammatory etiology, increase in size of both hepatic lesions and pulmonary nodules.  Urine and Blood Cx are pending  Additional labs in the ED revealed Na 129/ BUN 47/ Creatinine 2.77/ Calcium of 7.5/ Albumin of 1.5/ AST 61/ ALT 47/ Alk Phos of 262/ Total Bili 2.2 HGB 8.3/ WBC 4.1 ( was 21 last week) , platelets 46.000, PT 19. INR 1.6 UA positive for Nitrites, small leukocytes, large HGB In the ED he received Cefapime, Flagyl, and Vancomycin. T Max 102.6  PCCM have been asked to admit to ICU for sepsis, suspected urinary tract source.  Pertinent  Medical History  Aortic ectasia Tobacco abuse Colon cancer with mets to the brain, lung in addition to the  liver Craniotomy with brain tumor resection 08/05/2022  Significant Hospital Events: Including procedures, antibiotic start and stop dates in addition to other pertinent events   2/28 Craniotomy 3/1 Discharge from [facility] after crani 3/5 Admission to [facility] with Sepsis from emphysematous cystitis 3/8 off pressors, no vesicointestinal fistula on CT pyelogram  Interim History / Subjective:  Denies any abdominal pain today Able to tolerate some food Off pressors  Labs pending today Made 2400cc Urine yesterday  Objective   Blood pressure 109/85, pulse 95, temperature 97.9 F (36.6 C), temperature source Oral, resp. rate (!) 29, height '5\' 9"'$  (1.753 m), weight 78.5 kg, SpO2 100 %.  FiO2 (%):  [32 %] 32 %   Intake/Output Summary (Last 24 hours) at 08/14/2022 0741 Last data filed at 08/14/2022 0700 Gross per 24 hour  Intake 1133.12 ml  Output 2425 ml  Net -1291.88 ml    Filed Weights   08/11/22 0403 08/12/22 0333 08/14/22 0459  Weight: 74.6 kg 78.8 kg 78.5 kg    General:  chronically  ill-appearing M HEENT: MM pink/moist Neuro: awake, alert and oriented CV: s1s2 irregular, no m/r/g PULM:  clear bilaterally with equal chest rise and no distress GI: soft, colostomy in place, non-tender to palpation Extremities: warm/dry, no edema  Skin: no rashes or lesions     Urine culture: pan-sensitive E coli, E faecalis Blood cultures: E coli 4/4 Repeat blood cultures NGTD    Resolved Hospital Problem list   Hyponatremia Acute metabolic encephalopathy 2/2 sepsis Lactic acidosis due to sepsis AKI due to septic shock; resolved  Assessment & Plan:    Septic shock due to E coli bacteremia and E Coli & Enterococcus UTI with emphysematous cystitis, pyelonephritis Improving and off pressors since yesterday -appreciate ID and urology consults, CT pyelogram without vesiculointestinal fistula -continue Unasyn per ID -continue foley  -repeat blood cultures pending, NGTD -no presacral fluid collection on CT, noted 4.2x 6.4cm presacral mass favoring residual or recurrent rectal tumor -he is stable for transfer out of ICU today   Acute respiratory failure with hypoxia- unsure if inflammatory condition due to bacteremia vs bacterial pneumonia Current every day smoker  Pulmonary mets from colon cancer; lingular lesion likely enlarging -wean supplemental O2, down to 2L [place] -pulmonary hygiene -OOB mobility -recommend quitting smoking  New onset Afib with RVR -continue amiodarone -unable to [facility] due to severe thrombocytopenia; discussed risk of stroke or other clots with him today -check TSH    Hyperglycemia, uncontrolled -will likely improve once off steroids -continue detemir 10 units BID - mealtime insulin 3 units TIDAC -SSI PRN -goal BG 140-180  Thrombocytopenia likely 2/2 sepsis -transfuse for platelets <10, monitor periodically -unable to give DVT prophylaxis due to high bleeding risk, but he is at very high risk of DVT (cancer, critical illness, recent surgery,  immobility) -monitor daily CBC  Status-post Craniotomy with tumor resection per Dr. Larose Hires 2/28 -con't keppra and dexamethsone per NS -seizure precautions -finishing dexamethasone course today  Pain due to metastatic cancer  -dilaudid and oxycodone PRN  Goals of Care   -Palliative team have been consulted and he has requested they no longer participate in his care.  -wife is Air traffic controller -encourage mobilization and work with PT     Best Practice (right click and "Reselect all SmartList Selections" daily)   Diet/type:  regular DVT prophylaxis: SCD GI prophylaxis: PPI Lines: Central line>> port Foley:  Yes, and it is still needed Code Status:  full code Last date of multidisciplinary goals of care discussion [3/5- see Palliative care note ]   Labs   CBC: Recent Labs  Lab 08/10/22 0809 08/10/22 0853 08/11/22 0336 08/11/22 1216 08/13/22 0633  WBC 4.1  --  10.5 9.3 3.6*  NEUTROABS 3.0  --   --   --   --   HGB 8.3* 7.5* 7.2* 7.4* 7.6*  HCT 25.2* 22.0* 21.3* 22.7* 22.7*  MCV 86.9  --  84.9 86.3 85.7  PLT 46*  --  16* 21* 20*     Basic Metabolic Panel: Recent Labs  Lab 08/10/22 0809 08/10/22 0853 08/10/22 2301 08/11/22 0336 08/13/22 0633  NA 129* 131* 135 136  138  K 4.5 4.5 3.7 3.5 3.6  CL 98 99 103 102 106  CO2 17*  --  20* 19* 22  GLUCOSE 132* 129* 175* 150* 275*  BUN 47* 37* 36* 35* 43*  CREATININE 2.77* 2.70* 1.73* 1.56* 0.87  CALCIUM 7.5*  --  8.3* 8.5* 8.5*  MG 1.5*  --  3.1* 3.1*  --   PHOS 3.0  --   --  4.9*  --     GFR: Estimated Creatinine Clearance: 98.2 mL/min (by C-G formula based on SCr of 0.87 mg/dL). Recent Labs  Lab 08/10/22 0809 08/10/22 1007 08/11/22 0336 08/11/22 1216 08/13/22 0633  PROCALCITON 130.49  --  75.91  --   --   WBC 4.1  --  10.5 9.3 3.6*  LATICACIDVEN 8.6* 5.3* 4.5*  --   --      Liver Function Tests: Recent Labs  Lab 08/10/22 0809 08/11/22 0336  AST 61* 472*  ALT 47* 340*  ALKPHOS 262* 109   BILITOT 2.2* 3.5*  PROT 4.5* 5.8*  ALBUMIN 1.5* 3.3*    No results for input(s): "LIPASE", "AMYLASE" in the last 168 hours. Recent Labs  Lab 08/11/22 0336  AMMONIA 29     Critical care time:     Otilio Carpen Osmond Steckman, PA-C [facility] See Amion for pager If no response to pager , please call [phone] until 7pm After 7:00 pm call Elink  [phone]?Early

## 2022-08-14 NOTE — TOC Initial Note (Addendum)
Transition of Care [facility]) - Initial/Assessment Note    Patient Details  Name: James Coffey MRN: [id number] Date of Birth: 20-Mar-1971  Transition of Care [facility]) CM/SW Contact:    Tom-Johnson, Renea Ee, RN Phone Number: 08/14/2022, 3:30 PM  Clinical Narrative:                  Patient is admitted for Sepsis 2/2 Emphysematous Cystitis and E. Coli Bacteremia. On IV abx till 03/21 then transition to Augmentin at discharge per ID.  Patient has hx of Rectal and Colon Cancer with Metastasis to the Lungs, Liver, Brain, s/p Left Frontal Lobe resection of Brain tumor on 08/05/2022. Oncology following. History of Fournier's gangrene for which he had multiple debridements in 2022. Has a Colostomy and gets supplies from [employer].   From home with wife and three children. Not employed, on disability. Family transports to appointments. Patient does not have a PCP but sees his Oncologist, Laurena Spies, [place] referral called in to [facility] and [facility] voiced acceptance, info on AVS. CM will continue to follow as patient progresses with care towards discharge.        Expected Discharge Plan: [place] Barriers to Discharge: Continued Medical Work up   Patient Goals and CMS Choice Patient states their goals for this hospitalization and ongoing recovery are:: To return home CMS Medicare.gov Compare Post Acute Care list provided to:: Patient Choice offered to / list presented to : Patient, Spouse, Adult Children (Daughter, Adelisa)      Expected Discharge Plan and Services   Discharge Planning Services: CM Consult Post Acute Care Choice: [place] arrangements for the past 2 months: Apartment                 DME Arranged: N/A DME Agency: NA       HH Arranged: PT HH Agency: [place] Date [place]: 08/13/22 Time HH Agency Contacted: 35 Representative spoke with at [place]: Tommi Rumps  Prior Living  Arrangements/Services Living arrangements for the past 2 months: Apartment Lives with:: Spouse, Adult Children Patient language and need for interpreter reviewed:: Yes Do you feel safe going back to the place where you live?: Yes      Need for Family Participation in Patient Care: Yes (Comment) Care giver support system in place?: Yes (comment)   Criminal Activity/Legal Involvement Pertinent to Current Situation/Hospitalization: No - Comment as needed  Activities of Daily Living      Permission Sought/Granted Permission sought to share information with : Case Manager, Customer service manager, Family Supports Permission granted to share information with : Yes, Verbal Permission Granted              Emotional Assessment Appearance:: Appears stated age Attitude/Demeanor/Rapport: Engaged, Gracious Affect (typically observed): Accepting, Appropriate, Calm, Hopeful, Pleasant Orientation: : Oriented to Self, Oriented to Place, Oriented to  Time, Oriented to Situation Alcohol / Substance Use: Not Applicable Psych Involvement: No (comment)  Admission diagnosis:  Sepsis [facility]) [A41.9] Patient Active Problem List   Diagnosis Date Noted   Pyelonephritis 08/14/2022   E coli bacteremia 08/13/2022   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   Septic shock ([place]) 08/13/2022   Status post craniotomy 08/05/2022   AKI (acute kidney injury) ([place]) 07/23/2022   Rectal cancer metastasized to liver ([place]) 07/23/2022   Metastasis to brain ([place]) 07/22/2022   AMS (altered mental status) 07/22/2022   Surgical wound, non healing 04/22/2022   Fascial dehiscence of  Terrial Rhodes site 04/22/2022   Colostomy complication ([place]) [id number]   Aortic ectasia, 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/21/2022   Anemia 03/18/2022   Intra-abdominal abscess ([place]) 03/16/2022   Hyponatremia 03/16/2022   Mild protein malnutrition ([place]) 03/16/2022   Hyperglycemia 03/16/2022   Rectal cancer metastasized to lung ([place]) 02/04/2022    Colorectal carcinoma ([place]) 10/08/2021   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 ([place]) 05/07/2021   Counseling regarding advance care planning and goals of care 05/07/2021   Port-A-Cath in place 03/05/2021   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[place])    Malnutrition of moderate degree 12/04/2020   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 11/30/2020   [place] speaking patient - Prefers [place] or [place] 11/30/2020   Fourniers gangrene 11/30/2020   Perianal abscess    Perirectal abscess 11/03/2020   Cellulitis 11/03/2020   Proctitis 11/03/2020   Sepsis ([place]) 11/03/2020   Tobacco abuse 11/03/2020   PCP:  Patient, No Pcp Per Pharmacy:   [place] [address] Phone: [phone] Fax: [phone]  [facility] [place], [place] - [address] AT [address] [place] [address] Phone: [phone] Fax: [phone]     Social Determinants of Health (SDOH) Social History: [place]: No Food Insecurity (07/23/2022)  Housing: Low Risk  (07/23/2022)  Transportation Needs: No Transportation Needs (07/23/2022)  Utilities: Not At Risk (07/23/2022)  Depression (PHQ2-9): Medium Risk (01/31/2021)  Tobacco Use: High Risk (08/10/2022)   SDOH Interventions: Transportation Interventions: Intervention Not Indicated, Inpatient TOC, Patient Resources (Friends/Family)   Readmission Risk Interventions    03/22/2022   12:38 PM  Readmission Risk Prevention Plan  Transportation Screening Complete  PCP or Specialist Appt within 5-7 Days Complete  Home Care Screening Complete  Medication Review (RN CM) Complete

## 2022-08-15 ENCOUNTER — Other Ambulatory Visit (HOSPITAL_COMMUNITY): Payer: Medicaid Other

## 2022-08-15 LAB — CBC
HCT: 25.4 % — ABNORMAL LOW (ref 39.0–52.0)
Hemoglobin: 8.3 g/dL — ABNORMAL LOW (ref 13.0–17.0)
MCH: 28.6 pg (ref 26.0–34.0)
MCHC: 32.7 g/dL (ref 30.0–36.0)
MCV: 87.6 fL (ref 80.0–100.0)
Platelets: 45 10*3/uL — ABNORMAL LOW (ref 150–400)
RBC: 2.9 MIL/uL — ABNORMAL LOW (ref 4.22–5.81)
RDW: 23.1 % — ABNORMAL HIGH (ref 11.5–15.5)
WBC: 6.3 10*3/uL (ref 4.0–10.5)
nRBC: 1.4 % — ABNORMAL HIGH (ref 0.0–0.2)

## 2022-08-15 LAB — BASIC METABOLIC PANEL
Anion gap: 9 (ref 5–15)
BUN: 27 mg/dL — ABNORMAL HIGH (ref 6–20)
CO2: 25 mmol/L (ref 22–32)
Calcium: 8.5 mg/dL — ABNORMAL LOW (ref 8.9–10.3)
Chloride: 106 mmol/L (ref 98–111)
Creatinine, Ser: 0.84 mg/dL (ref 0.61–1.24)
GFR, Estimated: >60 mL/min (ref >60)
Glucose, Bld: 193 mg/dL — ABNORMAL HIGH (ref 70–99)
Potassium: 4 mmol/L (ref 3.5–5.1)
Sodium: 140 mmol/L (ref 135–145)

## 2022-08-15 LAB — GLUCOSE, CAPILLARY
Glucose-Capillary: 60 mg/dL — ABNORMAL LOW (ref 70–99)
Glucose-Capillary: 118 mg/dL — ABNORMAL HIGH (ref 70–99)

## 2022-08-15 LAB — MAGNESIUM: Magnesium: 1.8 mg/dL (ref 1.7–2.4)

## 2022-08-15 LAB — TSH: TSH: 0.577 u[IU]/mL (ref 0.350–4.500)

## 2022-08-15 MED ORDER — INSULIN DETEMIR 100 UNIT/ML ~~LOC~~ SOLN
6.0000 [IU] | Freq: Two times a day (BID) | SUBCUTANEOUS | Status: DC
  Administered 2022-08-15 – 2022-08-17 (×5): 6 [IU] via SUBCUTANEOUS
  Filled 2022-08-15 (×7): qty 0.06

## 2022-08-15 NOTE — Progress Notes (Signed)
PROGRESS NOTE        PATIENT DETAILS Name: James Coffey Age: 52 y.o. Sex: male Date of Birth: 18-Mar-1971 Admit Date: 08/10/2022 Admitting Physician Freddi Starr, MD QP:[id number], No Pcp Per  Brief Summary: Patient is a 52 y.o.  male history of metastatic colon cancer-s/p colectomy-s/p recent craniotomy with tumor resection 2/28-who presented with septic shock in the setting of emphysematous cystitis/pyelonephritis and E. coli bacteremia.  Significant events: 2/28>> craniotomy 3/01>> discharged from [facility] after craniotomy 3/05>> admit to ICU by PCCM with septic shock/AKI 3/08>> off pressors, no vesicle intestinal fistula on CT cystogram. 3/09>> transfer to [facility]  Significant studies: 3/04>> CT head: Postoperative changes in the left frontal craniotomy 3/04>> CT chest/abdomen/pelvis: Gas in the urinary bladder wall-concerning for emphysematous cystitis, mild bilateral hydroureteral nephrosis at the level of the urinary bladder, similar appearance of the fluid/gas collection in the presacral/mesorectal space.  New patchy bilateral groundglass opacities. 3/07>> CT cystogram abdomen/pelvis: No evidence of vesicle intestinal fistula, 4.2 x 6.4 cm and necrotic presacral mass-at the site of prior fluid collection-s/p drainage-favoring residual/recurrent rectal tumor rather than a fluid collection.  Progressive hepatic metastases.  Significant microbiology data: 3/04>> COVID/influenza/RSV PCR: Negative 3/04>> blood cultures: E. Coli 3/04>> urine culture: E. coli, Enterococcus 3/05>> blood cultures: No growth  Procedures: None  Consults: PCCM General surgery Infectious disease Urology  Subjective: Lying comfortably in bed-denies any chest pain or shortness of breath.  Objective: Vitals: Blood pressure 92/72, pulse 88, temperature 98.2 F (36.8 C), temperature source Oral, resp. rate 16, height '5\' 9"'$  (1.753 m), weight 74.7 kg, SpO2 99 %.   Exam: Gen  Exam:Alert awake-not in any distress.  [name] sick appearing. HEENT:atraumatic, normocephalic Chest: B/L clear to auscultation anteriorly CVS:S1S2 regular Abdomen:soft non tender, non distended.  Colostomy in place. Extremities:no edema Neurology: Non focal-but generalized weakness. Skin: no rash  Pertinent Labs/Radiology:    Latest Ref Rng & Units 08/15/2022    3:24 AM 08/14/2022   11:10 AM 08/13/2022    6:33 AM  CBC  WBC 4.0 - 10.5 K/uL 6.3  5.7  3.6   Hemoglobin 13.0 - 17.0 g/dL 8.3  9.4  7.6   Hematocrit 39.0 - 52.0 % 25.4  30.2  22.7   Platelets 150 - 400 K/uL 45  38  20     Lab Results  Component Value Date   NA 140 08/15/2022   K 4.0 08/15/2022   CL 106 08/15/2022   CO2 25 08/15/2022      Assessment/Plan: Septic shock due to E. coli bacteremia and E. coli emphysematous cystitis Sepsis physiology has improved Continue Unasyn per ID  Acute hypoxic respiratory failure likely due to PNA Improving-on minimal amount of oxygen Continue pulmonary toileting On IV Unasyn  A-fib RVR Rate controlled with amiodarone Not a candidate for anticoagulation currently given severe thrombocytopenia TSH stable Check echo  AKI Hemodynamically mediated in the setting of septic shock Resolved with supportive care Foley catheter remains in place-Per urology consult note-leave Foley catheter in place-until repeat imaging shows resolution of presenting issues  Transaminitis Probably shock liver Check transaminases with a.m. labs  Thrombocytopenia Secondary to septic shock Improving Follow CBC  Normocytic anemia Due to critical illness Follow CBC Transfuse if Hb <7  Presacral fluid fluid collection General surgery has no plans for drainage/biopsy-as this is felt to be a residual collection  Recent left  frontal craniotomy with tumor resection-2/28 (Dr. [name]) Per chart review-has completed a short course of steroids/Keppra  Steroid induced hyperglycemia (A1c 5.51  03/16/2022) Seems to have completed a course of Decadron on 3/8 Watch CBGs closely-will need to start cutting back on his insulin dose  Chronic pain syndrome Continue as needed narcotics  Metastatic rectal adenocarcinoma 11/30/2020 - rectal biopsies confirm adenocarcinoma  12/13/2020 - descending loop colostomy, Dr. Harlow Asa S/p neoadjuvant FOLFOX chemotherapy 08/28/2021- open left hepatectomy Dr. [name] 10/2021- ablation of a right liver metastasis  S/p chemotherapy and radiation therapy of rectal mass S/p radiation therapy of lung lesions 02/04/2022 - robotic APR, parastomal hernia repair Dr. [name]  S/p radiation therapy of lung lesions 10-04/2022 IR drainage of pre-sacral collections, injection studies not significant for fistula to bladder at that time. 07/31/2022 S/p single dose of radiation therapy to the brain 08/05/2022 - left frontal craniotomy for tumor resection, Dr. [name]  Patient currently being considered for Y90 treatments of liver metastasis by IR Dr. Midge Minium w Dr. Irene Limbo  BMI: Estimated body mass index is 24.32 kg/m as calculated from the following:   Height as of this encounter: '5\' 9"'$  (1.753 m).   Weight as of this encounter: 74.7 kg.   Code status:   Code Status: Full Code   DVT Prophylaxis: SCDs Start: 08/10/22 1304 Start pharmacological prophylaxis once thrombocytopenia improves further.   Family Communication: None at bedside   Disposition Plan: Status is: Inpatient Remains inpatient appropriate because: Severity of illness   Planned Discharge Destination:Skilled nursing facility versus home health   Diet: Diet Order             Diet regular Room service appropriate? Yes; Fluid consistency: Thin  Diet effective now                     Antimicrobial agents: Anti-infectives (From admission, onward)    Start     Dose/Rate Route Frequency Ordered Stop   08/13/22 1500  Ampicillin-Sulbactam (UNASYN) 3 g in sodium chloride 0.9 % 100 mL  IVPB        3 g 200 mL/hr over 30 Minutes Intravenous Every 6 hours 08/13/22 1147 08/27/22 2359   08/13/22 1045  ampicillin (OMNIPEN) 2 g in sodium chloride 0.9 % 100 mL IVPB  Status:  Discontinued        2 g 300 mL/hr over 20 Minutes Intravenous Every 4 hours 08/13/22 0955 08/13/22 1147   08/12/22 0830  cefTRIAXone (ROCEPHIN) 2 g in sodium chloride 0.9 % 100 mL IVPB  Status:  Discontinued        2 g 200 mL/hr over 30 Minutes Intravenous Every 24 hours 08/12/22 0737 08/13/22 0955   08/11/22 1800  meropenem (MERREM) 1 g in sodium chloride 0.9 % 100 mL IVPB  Status:  Discontinued        1 g 200 mL/hr over 30 Minutes Intravenous Every 8 hours 08/11/22 1442 08/12/22 0737   08/10/22 2200  ceFEPIme (MAXIPIME) 2 g in sodium chloride 0.9 % 100 mL IVPB  Status:  Discontinued        2 g 200 mL/hr over 30 Minutes Intravenous Every 12 hours 08/10/22 0942 08/10/22 1311   08/10/22 1600  meropenem (MERREM) 1 g in sodium chloride 0.9 % 100 mL IVPB  Status:  Discontinued        1 g 200 mL/hr over 30 Minutes Intravenous Every 12 hours 08/10/22 1342 08/11/22 1442   08/10/22 1010  vancomycin variable dose per  unstable renal function (pharmacist dosing)  Status:  Discontinued         Does not apply See admin instructions 08/10/22 1010 08/10/22 2146   08/10/22 0830  ceFEPIme (MAXIPIME) 2 g in sodium chloride 0.9 % 100 mL IVPB        2 g 200 mL/hr over 30 Minutes Intravenous  Once 08/10/22 0825 08/10/22 0906   08/10/22 0830  metroNIDAZOLE (FLAGYL) IVPB 500 mg        500 mg 100 mL/hr over 60 Minutes Intravenous  Once 08/10/22 0825 08/10/22 1036   08/10/22 0830  vancomycin (VANCOCIN) IVPB 1000 mg/200 mL premix  Status:  Discontinued        1,000 mg 200 mL/hr over 60 Minutes Intravenous  Once 08/10/22 0825 08/10/22 0828   08/10/22 0830  vancomycin (VANCOREADY) IVPB 1500 mg/300 mL        1,500 mg 150 mL/hr over 120 Minutes Intravenous  Once 08/10/22 0828 08/10/22 1236        MEDICATIONS: Scheduled Meds:   Chlorhexidine Gluconate Cloth  6 each Topical Daily   insulin aspart  0-15 Units Subcutaneous TID WC   insulin aspart  0-5 Units Subcutaneous QHS   insulin aspart  3 Units Subcutaneous TID WC   insulin detemir  10 Units Subcutaneous BID   pantoprazole  40 mg Oral Daily   Continuous Infusions:  sodium chloride 10 mL/hr at 08/15/22 0600   amiodarone 30 mg/hr (08/15/22 0600)   ampicillin-sulbactam (UNASYN) IV 3 g (08/15/22 0825)   PRN Meds:.acetaminophen, albuterol, docusate sodium, guaiFENesin-codeine, HYDROmorphone (DILAUDID) injection, ondansetron (ZOFRAN) IV, mouth rinse, oxyCODONE-acetaminophen, polyethylene glycol   I have personally reviewed following labs and imaging studies  LABORATORY DATA: CBC: Recent Labs  Lab 08/10/22 0809 08/10/22 0853 08/11/22 0336 08/11/22 1216 08/13/22 0633 08/14/22 1110 08/15/22 0324  WBC 4.1  --  10.5 9.3 3.6* 5.7 6.3  NEUTROABS 3.0  --   --   --   --   --   --   HGB 8.3*   < > 7.2* 7.4* 7.6* 9.4* 8.3*  HCT 25.2*   < > 21.3* 22.7* 22.7* 30.2* 25.4*  MCV 86.9  --  84.9 86.3 85.7 89.9 87.6  PLT 46*  --  16* 21* 20* 38* 45*   < > = values in this interval not displayed.    Basic Metabolic Panel: Recent Labs  Lab 08/10/22 0809 08/10/22 0853 08/10/22 2301 08/11/22 0336 08/13/22 0633 08/14/22 1110 08/15/22 0324  NA 129*   < > 135 136 138 143 140  K 4.5   < > 3.7 3.5 3.6 4.2 4.0  CL 98   < > 103 102 106 107 106  CO2 17*  --  20* 19* '22 24 25  '$ GLUCOSE 132*   < > 175* 150* 275* 170* 193*  BUN 47*   < > 36* 35* 43* 34* 27*  CREATININE 2.77*   < > 1.73* 1.56* 0.87 0.91 0.84  CALCIUM 7.5*  --  8.3* 8.5* 8.5* 9.0 8.5*  MG 1.5*  --  3.1* 3.1*  --  1.9 1.8  PHOS 3.0  --   --  4.9*  --  4.0  --    < > = values in this interval not displayed.    GFR: Estimated Creatinine Clearance: 101.7 mL/min (by C-G formula based on SCr of 0.84 mg/dL).  Liver Function Tests: Recent Labs  Lab 08/10/22 0809 08/11/22 0336  AST 61* 472*  ALT 47* 340*   ALKPHOS  262* 109  BILITOT 2.2* 3.5*  PROT 4.5* 5.8*  ALBUMIN 1.5* 3.3*   No results for input(s): "LIPASE", "AMYLASE" in the last 168 hours. Recent Labs  Lab 08/11/22 0336  AMMONIA 29    Coagulation Profile: Recent Labs  Lab 08/10/22 0809  INR 1.6*    Cardiac Enzymes: No results for input(s): "CKTOTAL", "CKMB", "CKMBINDEX", "TROPONINI" in the last 168 hours.  BNP (last 3 results) No results for input(s): "PROBNP" in the last 8760 hours.  Lipid Profile: No results for input(s): "CHOL", "HDL", "LDLCALC", "TRIG", "CHOLHDL", "LDLDIRECT" in the last 72 hours.  Thyroid Function Tests: Recent Labs    08/15/22 0324  TSH 0.577    Anemia Panel: No results for input(s): "VITAMINB12", "FOLATE", "FERRITIN", "TIBC", "IRON", "RETICCTPCT" in the last 72 hours.  Urine analysis:    Component Value Date/Time   COLORURINE AMBER (A) 08/10/2022 1140   APPEARANCEUR CLOUDY (A) 08/10/2022 1140   LABSPEC 1.011 08/10/2022 1140   PHURINE 5.0 08/10/2022 1140   GLUCOSEU NEGATIVE 08/10/2022 1140   HGBUR LARGE (A) 08/10/2022 1140   BILIRUBINUR NEGATIVE 08/10/2022 1140   KETONESUR NEGATIVE 08/10/2022 1140   PROTEINUR 100 (A) 08/10/2022 1140   NITRITE POSITIVE (A) 08/10/2022 1140   LEUKOCYTESUR SMALL (A) 08/10/2022 1140    Sepsis Labs: Lactic Acid, Venous    Component Value Date/Time   LATICACIDVEN 4.5 (HH) 08/11/2022 0336    MICROBIOLOGY: Recent Results (from the past 240 hour(s))  MRSA Next Gen by PCR, Nasal     Status: Abnormal   Collection Time: 08/05/22  5:21 PM   Specimen: Nasal Mucosa; Nasal Swab  Result Value Ref Range Status   MRSA by PCR Next Gen DETECTED (A) NOT DETECTED Final    Comment: RESULT CALLED TO, READ BACK BY AND VERIFIED WITH: K JONES,RN'@2236'$  08/05/22 [place] (NOTE) The GeneXpert MRSA Assay (FDA approved for NASAL specimens only), is one component of a comprehensive MRSA colonization surveillance program. It is not intended to diagnose MRSA infection nor to  guide or monitor treatment for MRSA infections. Test performance is not FDA approved in patients less than 38 years old. Performed at [facility], [place] [address], [place], [place] [address]   Culture, blood (Routine x 2)     Status: Abnormal   Collection Time: 08/10/22  8:14 AM   Specimen: BLOOD RIGHT HAND  Result Value Ref Range Status   Specimen Description BLOOD RIGHT HAND  Final   Special Requests   Final    BOTTLES DRAWN AEROBIC AND ANAEROBIC Blood Culture adequate volume   Culture  Setup Time   Final    GRAM NEGATIVE RODS IN BOTH AEROBIC AND ANAEROBIC BOTTLES CRITICAL RESULT CALLED TO, READ BACK BY AND VERIFIED WITH: Levada Dy [id number] '@2137'$  FH    Culture (A)  Final    ESCHERICHIA COLI SUSCEPTIBILITIES PERFORMED ON PREVIOUS CULTURE WITHIN THE LAST 5 DAYS. Performed at [facility], [place] [address], [place], [place] [address]    Report Status 08/13/2022 FINAL  Final  Culture, blood (Routine x 2)     Status: Abnormal   Collection Time: 08/10/22  8:24 AM   Specimen: BLOOD  Result Value Ref Range Status   Specimen Description BLOOD RIGHT ANTECUBITAL  Final   Special Requests   Final    BOTTLES DRAWN AEROBIC AND ANAEROBIC Blood Culture adequate volume   Culture  Setup Time   Final    GRAM NEGATIVE RODS IN BOTH AEROBIC AND ANAEROBIC BOTTLES CRITICAL RESULT CALLED TO, READ  BACK BY AND VERIFIED WITH: PHARMD C. Levada Dy [id number] '@2137'$  FH Performed at [facility] [address], [place], [place] [address]    Culture ESCHERICHIA COLI (A)  Final   Report Status 08/13/2022 FINAL  Final   Organism ID, Bacteria ESCHERICHIA COLI  Final      Susceptibility   Escherichia coli - MIC*    AMPICILLIN <=2 SENSITIVE Sensitive     CEFEPIME <=0.12 SENSITIVE Sensitive     CEFTAZIDIME <=1 SENSITIVE Sensitive     CEFTRIAXONE <=0.25 SENSITIVE Sensitive     CIPROFLOXACIN <=0.25 SENSITIVE Sensitive     GENTAMICIN <=1 SENSITIVE Sensitive     IMIPENEM <=0.25 SENSITIVE Sensitive      TRIMETH/SULFA <=20 SENSITIVE Sensitive     AMPICILLIN/SULBACTAM <=2 SENSITIVE Sensitive     PIP/TAZO <=4 SENSITIVE Sensitive     * ESCHERICHIA COLI  Blood Culture ID Panel (Reflexed)     Status: Abnormal   Collection Time: 08/10/22  8:24 AM  Result Value Ref Range Status   Enterococcus faecalis NOT DETECTED NOT DETECTED Final   Enterococcus Faecium NOT DETECTED NOT DETECTED Final   Listeria monocytogenes NOT DETECTED NOT DETECTED Final   Staphylococcus species NOT DETECTED NOT DETECTED Final   Staphylococcus aureus (BCID) NOT DETECTED NOT DETECTED Final   Staphylococcus epidermidis NOT DETECTED NOT DETECTED Final   Staphylococcus lugdunensis NOT DETECTED NOT DETECTED Final   Streptococcus species NOT DETECTED NOT DETECTED Final   Streptococcus agalactiae NOT DETECTED NOT DETECTED Final   Streptococcus pneumoniae NOT DETECTED NOT DETECTED Final   Streptococcus pyogenes NOT DETECTED NOT DETECTED Final   A.calcoaceticus-baumannii NOT DETECTED NOT DETECTED Final   Bacteroides fragilis NOT DETECTED NOT DETECTED Final   Enterobacterales DETECTED (A) NOT DETECTED Final    Comment: CRITICAL RESULT CALLED TO, READ BACK BY AND VERIFIED WITH: PHARMD C. PIERCE [id number] '@2137'$  FH    Enterobacter cloacae complex NOT DETECTED NOT DETECTED Final   Escherichia coli DETECTED (A) NOT DETECTED Final    Comment: CRITICAL RESULT CALLED TO, READ BACK BY AND VERIFIED WITH: PHARMD C. Levada Dy [id number] '@2137'$  FH    Klebsiella aerogenes NOT DETECTED NOT DETECTED Final   Klebsiella oxytoca NOT DETECTED NOT DETECTED Final   Klebsiella pneumoniae NOT DETECTED NOT DETECTED Final   Proteus species NOT DETECTED NOT DETECTED Final   Salmonella species NOT DETECTED NOT DETECTED Final   Serratia marcescens NOT DETECTED NOT DETECTED Final   Haemophilus influenzae NOT DETECTED NOT DETECTED Final   Neisseria meningitidis NOT DETECTED NOT DETECTED Final   Pseudomonas aeruginosa NOT DETECTED NOT DETECTED Final    Stenotrophomonas maltophilia NOT DETECTED NOT DETECTED Final   Candida albicans NOT DETECTED NOT DETECTED Final   Candida auris NOT DETECTED NOT DETECTED Final   Candida glabrata NOT DETECTED NOT DETECTED Final   Candida krusei NOT DETECTED NOT DETECTED Final   Candida parapsilosis NOT DETECTED NOT DETECTED Final   Candida tropicalis NOT DETECTED NOT DETECTED Final   Cryptococcus neoformans/gattii NOT DETECTED NOT DETECTED Final   CTX-M ESBL NOT DETECTED NOT DETECTED Final   Carbapenem resistance IMP NOT DETECTED NOT DETECTED Final   Carbapenem resistance KPC NOT DETECTED NOT DETECTED Final   Carbapenem resistance NDM NOT DETECTED NOT DETECTED Final   Carbapenem resist OXA 48 LIKE NOT DETECTED NOT DETECTED Final   Carbapenem resistance VIM NOT DETECTED NOT DETECTED Final    Comment: Performed at [facility], [address] [place],  [address]  Resp panel by RT-PCR (RSV, Flu  A&B, Covid) Anterior Nasal Swab     Status: None   Collection Time: 08/10/22  8:25 AM   Specimen: Anterior Nasal Swab  Result Value Ref Range Status   SARS Coronavirus 2 by RT PCR NEGATIVE NEGATIVE Final   Influenza A by PCR NEGATIVE NEGATIVE Final   Influenza B by PCR NEGATIVE NEGATIVE Final    Comment: (NOTE) The Xpert Xpress SARS-CoV-2/FLU/RSV plus assay is intended as an aid in the diagnosis of influenza from Nasopharyngeal swab specimens and should not be used as a sole basis for treatment. Nasal washings and aspirates are unacceptable for Xpert Xpress SARS-CoV-2/FLU/RSV testing.  Fact Sheet for Patients: EntrepreneurPulse.com.au  Fact Sheet for Healthcare Providers: IncredibleEmployment.be  This test is not yet approved or cleared by the [place] FDA and has been authorized for detection and/or diagnosis of SARS-CoV-2 by FDA under an Emergency Use Authorization (EUA). This EUA will remain in effect (meaning this test can be used) for the duration of  the COVID-19 declaration under Section 564(b)(1) of the Act, 21 U.S.C. section 360bbb-3(b)(1), unless the authorization is terminated or revoked.     Resp Syncytial Virus by PCR NEGATIVE NEGATIVE Final    Comment: (NOTE) Fact Sheet for Patients: EntrepreneurPulse.com.au  Fact Sheet for Healthcare Providers: IncredibleEmployment.be  This test is not yet approved or cleared by the [place] FDA and has been authorized for detection and/or diagnosis of SARS-CoV-2 by FDA under an Emergency Use Authorization (EUA). This EUA will remain in effect (meaning this test can be used) for the duration of the COVID-19 declaration under Section 564(b)(1) of the Act, 21 U.S.C. section 360bbb-3(b)(1), unless the authorization is terminated or revoked.  Performed at [facility], [place] [address] [place],  [address]   Urine Culture (for pregnant, neutropenic or urologic patients or patients with an indwelling urinary catheter)     Status: Abnormal   Collection Time: 08/10/22  1:36 PM   Specimen: Urine, Catheterized  Result Value Ref Range Status   Specimen Description URINE, CATHETERIZED  Final   Special Requests   Final    NONE Performed at [facility], [place] [address] [place], [place] [address]    Culture (A)  Final    50,000 COLONIES/mL ESCHERICHIA COLI 10,000 COLONIES/mL ENTEROCOCCUS FAECALIS    Report Status 08/13/2022 FINAL  Final   Organism ID, Bacteria ESCHERICHIA COLI (A)  Final   Organism ID, Bacteria ENTEROCOCCUS FAECALIS (A)  Final      Susceptibility   Escherichia coli - MIC*    AMPICILLIN <=2 SENSITIVE Sensitive     CEFAZOLIN <=4 SENSITIVE Sensitive     CEFEPIME <=0.12 SENSITIVE Sensitive     CEFTRIAXONE <=0.25 SENSITIVE Sensitive     CIPROFLOXACIN <=0.25 SENSITIVE Sensitive     GENTAMICIN <=1 SENSITIVE Sensitive     IMIPENEM <=0.25 SENSITIVE Sensitive     NITROFURANTOIN <=16 SENSITIVE Sensitive     TRIMETH/SULFA  <=20 SENSITIVE Sensitive     AMPICILLIN/SULBACTAM <=2 SENSITIVE Sensitive     PIP/TAZO <=4 SENSITIVE Sensitive     * 50,000 COLONIES/mL ESCHERICHIA COLI   Enterococcus faecalis - MIC*    AMPICILLIN <=2 SENSITIVE Sensitive     NITROFURANTOIN <=16 SENSITIVE Sensitive     VANCOMYCIN 1 SENSITIVE Sensitive     LINEZOLID 1 SENSITIVE Sensitive     * 10,000 COLONIES/mL ENTEROCOCCUS FAECALIS  MRSA Next Gen by PCR, Nasal     Status: Abnormal   Collection Time: 08/10/22  5:54 PM   Specimen:  Nasal Mucosa; Nasal Swab  Result Value Ref Range Status   MRSA by PCR Next Gen DETECTED (A) NOT DETECTED Final    Comment: RESULT CALLED TO, READ BACK BY AND VERIFIED WITH: RN LESLIE SIMPSON ON 08/10/22 @ 1946 BY DRT (NOTE) The GeneXpert MRSA Assay (FDA approved for NASAL specimens only), is one component of a comprehensive MRSA colonization surveillance program. It is not intended to diagnose MRSA infection nor to guide or monitor treatment for MRSA infections. Test performance is not FDA approved in patients less than 27 years old. Performed at [facility], [place] [address], [place], [place] [address]   Culture, blood (Routine X 2) w Reflex to ID Panel     Status: None (Preliminary result)   Collection Time: 08/11/22  8:19 AM   Specimen: BLOOD LEFT HAND  Result Value Ref Range Status   Specimen Description BLOOD LEFT HAND  Final   Special Requests   Final    BOTTLES DRAWN AEROBIC AND ANAEROBIC Blood Culture results may not be optimal due to an inadequate volume of blood received in culture bottles   Culture   Final    NO GROWTH 4 DAYS Performed at [facility], [place] [address] [place], [place] [address]    Report Status PENDING  Incomplete  Culture, blood (Routine X 2) w Reflex to ID Panel     Status: None (Preliminary result)   Collection Time: 08/11/22  8:19 AM   Specimen: BLOOD LEFT HAND  Result Value Ref Range Status   Specimen Description BLOOD LEFT HAND  Final   Special Requests    Final    BOTTLES DRAWN AEROBIC ONLY Blood Culture results may not be optimal due to an inadequate volume of blood received in culture bottles   Culture   Final    NO GROWTH 4 DAYS Performed at [facility], [place] [address], [place], [place] [address]    Report Status PENDING  Incomplete    RADIOLOGY STUDIES/RESULTS: CT CYSTOGRAM ABD/PELVIS  Result Date: 08/13/2022 CLINICAL DATA:  Metastatic colon cancer, evaluate for vesicointestinal fistula EXAM: CT CYSTOGRAM (CT ABDOMEN AND PELVIS WITH CONTRAST) TECHNIQUE: Multi-detector CT imaging through the abdomen and pelvis was performed after dilute contrast had been introduced into the bladder for the purposes of performing CT cystography. RADIATION DOSE REDUCTION: This exam was performed according to the departmental dose-optimization program which includes automated exposure control, adjustment of the mA and/or kV according to patient size and/or use of iterative reconstruction technique. CONTRAST:  61m OMNIPAQUE IOHEXOL 350 MG/ML SOLN, 551mOMNIPAQUE IOHEXOL 300 MG/ML SOLN COMPARISON:  CT abdomen/pelvis without contrast dated 08/10/2022 and 07/21/2022. CT abdomen/pelvis with contrast dated 07/13/2022. FINDINGS: Lower chest: Multifocal ground-glass opacities in the lungs bilaterally, perihilar distribution with subpleural sparing (series 5/image 1), include visualized. This is new from the recent prior and suggests interstitial edema. Small bilateral pleural effusions, left greater than right. Hepatobiliary: Two large central hepatic metastases measuring up to 8.0 cm (series 3/image 13). Additional smaller inferior right hepatic lobe lesion measures 3.2 cm. This appearance is progressive from prior enhanced study. Gallbladder is decompressed. No intrahepatic or extrahepatic duct dilatation. Pancreas: Within normal limits. Spleen: Within normal limits. Adrenals/Urinary Tract: Adrenal glands are within normal limits. Kidneys are within normal limits, noting  reflux of contrast into the right renal collecting system. Mild fullness of the right renal collecting system without frank hydronephrosis. Mildly trabeculated bladder with indwelling Foley catheter and injected contrast with nondependent gas. Recent bladder pneumatosis  is not well visualized on the current study, but there is irregular bladder wall thickening superiorly (series 8/image 21), related to the patient's suspected cystitis. Notably, on delayed imaging, there is no extraluminal reflux of contrast into a loop of small bowel, including adjacent small bowel (series 8/image 15) or the necrotic presacral mass (described below). Stomach/Bowel: Stomach is within normal limits. No evidence of bowel obstruction. Appendix is not discretely visualized. Status post left hemicolectomy with left mid abdominal colostomy. Large bowel containing parastomal hernia (series 3/image 85), unchanged. 4.2 x 6.4 cm necrotic presacral mass (series 3/image 86), at the site of prior fluid collection status post drainage, although currently favoring residual/recurrent rectal tumor rather than a fluid collection/abscess. Persistent nondependent gas within this lesion (series 3/image 36), but when correlating with prior studies this is favored to be related to external communication (series 3/image 95) rather than vesicocolonic or enterocolonic fistula. Vascular/Lymphatic: No evidence of abdominal aortic aneurysm. Atherosclerotic calcifications of the abdominal aorta and branch vessels. No suspicious abdominopelvic lymphadenopathy. Reproductive: Prostate is poorly visualized. Other: Small volume left retroperitoneal/pelvic ascites. No free air. Musculoskeletal: Visualized osseous structures are within normal limits. IMPRESSION: No evidence of vesicointestinal fistula. Irregular bladder thickening, likely reflecting cystitis, with indwelling Foley catheter. 4.2 x 6.4 cm necrotic presacral mass, at the site of prior fluid collection  status post drainage, although currently favoring residual/recurrent rectal tumor rather than a fluid collection/abscess. Progressive hepatic metastases, as above. Suspected interstitial edema, incompletely visualized. Small bilateral pleural effusions, left greater than right. Additional ancillary findings as above. Electronically Signed   By: Julian Hy M.D.   On: 08/13/2022 18:42     LOS: 5 days   Oren Binet, MD  [facility]    To contact the attending provider between 7A-7P or the covering provider during after hours 7P-7A, please log into the web site [email or web address] and access using universal [place] password for that web site. If you do not have the password, please call the hospital operator.  08/15/2022, 9:35 AM

## 2022-08-15 NOTE — Plan of Care (Signed)
  Problem: Education: Goal: Knowledge of the prescribed therapeutic regimen will improve Outcome: Progressing   Problem: Clinical Measurements: Goal: Usual level of consciousness will be regained or maintained. Outcome: Progressing Goal: Neurologic status will improve Outcome: Progressing Goal: Ability to maintain intracranial pressure will improve Outcome: Progressing   Problem: Skin Integrity: Goal: Demonstration of wound healing without infection will improve Outcome: Progressing   Problem: Education: Goal: Knowledge of General Education information will improve Description: Including pain rating scale, medication(s)/side effects and non-pharmacologic comfort measures Outcome: Progressing   Problem: Health Behavior/Discharge Planning: Goal: Ability to manage health-related needs will improve Outcome: Progressing   Problem: Clinical Measurements: Goal: Ability to maintain clinical measurements within normal limits will improve Outcome: Progressing Goal: Will remain free from infection Outcome: Progressing Goal: Diagnostic test results will improve Outcome: Progressing Goal: Respiratory complications will improve Outcome: Progressing Goal: Cardiovascular complication will be avoided Outcome: Progressing   Problem: Activity: Goal: Risk for activity intolerance will decrease Outcome: Progressing   Problem: Nutrition: Goal: Adequate nutrition will be maintained Outcome: Progressing   Problem: Coping: Goal: Level of anxiety will decrease Outcome: Progressing   Problem: Elimination: Goal: Will not experience complications related to bowel motility Outcome: Progressing Goal: Will not experience complications related to urinary retention Outcome: Progressing   Problem: Pain Managment: Goal: General experience of comfort will improve Outcome: Progressing   Problem: Safety: Goal: Ability to remain free from injury will improve Outcome: Progressing   Problem:  Skin Integrity: Goal: Risk for impaired skin integrity will decrease Outcome: Progressing   Problem: Education: Goal: Ability to describe self-care measures that may prevent or decrease complications (Diabetes Survival Skills Education) will improve Outcome: Progressing Goal: Individualized Educational Video(s) Outcome: Progressing   Problem: Coping: Goal: Ability to adjust to condition or change in health will improve Outcome: Progressing   Problem: Fluid Volume: Goal: Ability to maintain a balanced intake and output will improve Outcome: Progressing   Problem: Health Behavior/Discharge Planning: Goal: Ability to identify and utilize available resources and services will improve Outcome: Progressing Goal: Ability to manage health-related needs will improve Outcome: Progressing   Problem: Metabolic: Goal: Ability to maintain appropriate glucose levels will improve Outcome: Progressing   Problem: Nutritional: Goal: Maintenance of adequate nutrition will improve Outcome: Progressing Goal: Progress toward achieving an optimal weight will improve Outcome: Progressing   Problem: Skin Integrity: Goal: Risk for impaired skin integrity will decrease Outcome: Progressing   Problem: Tissue Perfusion: Goal: Adequacy of tissue perfusion will improve Outcome: Progressing

## 2022-08-16 ENCOUNTER — Inpatient Hospital Stay (HOSPITAL_COMMUNITY): Payer: Medicaid Other

## 2022-08-16 DIAGNOSIS — I4891 Unspecified atrial fibrillation: Secondary | ICD-10-CM | POA: Diagnosis not present

## 2022-08-16 DIAGNOSIS — R7881 Bacteremia: Secondary | ICD-10-CM

## 2022-08-16 LAB — CBC WITH DIFFERENTIAL/PLATELET
Abs Immature Granulocytes: 0.31 10*3/uL — ABNORMAL HIGH (ref 0.00–0.07)
Basophils Absolute: 0 10*3/uL (ref 0.0–0.1)
Basophils Relative: 0 %
Eosinophils Absolute: 0 10*3/uL (ref 0.0–0.5)
Eosinophils Relative: 0 %
HCT: 24.8 % — ABNORMAL LOW (ref 39.0–52.0)
Hemoglobin: 8 g/dL — ABNORMAL LOW (ref 13.0–17.0)
Immature Granulocytes: 3 %
Lymphocytes Relative: 6 %
Lymphs Abs: 0.7 10*3/uL (ref 0.7–4.0)
MCH: 28.6 pg (ref 26.0–34.0)
MCHC: 32.3 g/dL (ref 30.0–36.0)
MCV: 88.6 fL (ref 80.0–100.0)
Monocytes Absolute: 0.2 10*3/uL (ref 0.1–1.0)
Monocytes Relative: 2 %
Neutro Abs: 9.3 10*3/uL — ABNORMAL HIGH (ref 1.7–7.7)
Neutrophils Relative %: 89 %
Platelets: 64 10*3/uL — ABNORMAL LOW (ref 150–400)
RBC: 2.8 MIL/uL — ABNORMAL LOW (ref 4.22–5.81)
RDW: 23.5 % — ABNORMAL HIGH (ref 11.5–15.5)
WBC: 10.4 10*3/uL (ref 4.0–10.5)
nRBC: 2.6 % — ABNORMAL HIGH (ref 0.0–0.2)

## 2022-08-16 LAB — ECHOCARDIOGRAM COMPLETE
AR max vel: 1.72 cm2
AV Area VTI: 1.57 cm2
AV Area mean vel: 1.47 cm2
AV Mean grad: 3.5 mmHg
AV Peak grad: 6.4 mmHg
Ao pk vel: 1.27 m/s
Area-P 1/2: 4.68 cm2
Calc EF: 42.7 %
Height: 69 in
MV M vel: 2.68 m/s
MV Peak grad: 28.7 mmHg
S' Lateral: 4.2 cm
Single Plane A2C EF: 42.8 %
Single Plane A4C EF: 43.6 %
Weight: 2712.54 oz

## 2022-08-16 LAB — COMPREHENSIVE METABOLIC PANEL
ALT: 120 U/L — ABNORMAL HIGH (ref 0–44)
AST: 32 U/L (ref 15–41)
Albumin: 2 g/dL — ABNORMAL LOW (ref 3.5–5.0)
Alkaline Phosphatase: 165 U/L — ABNORMAL HIGH (ref 38–126)
Anion gap: 6 (ref 5–15)
BUN: 23 mg/dL — ABNORMAL HIGH (ref 6–20)
CO2: 27 mmol/L (ref 22–32)
Calcium: 8.3 mg/dL — ABNORMAL LOW (ref 8.9–10.3)
Chloride: 108 mmol/L (ref 98–111)
Creatinine, Ser: 0.74 mg/dL (ref 0.61–1.24)
GFR, Estimated: >60 mL/min (ref >60)
Glucose, Bld: 96 mg/dL (ref 70–99)
Potassium: 4 mmol/L (ref 3.5–5.1)
Sodium: 141 mmol/L (ref 135–145)
Total Bilirubin: 2.9 mg/dL — ABNORMAL HIGH (ref 0.3–1.2)
Total Protein: 4.6 g/dL — ABNORMAL LOW (ref 6.5–8.1)

## 2022-08-16 LAB — GLUCOSE, CAPILLARY
Glucose-Capillary: 110 mg/dL — ABNORMAL HIGH (ref 70–99)
Glucose-Capillary: 93 mg/dL (ref 70–99)
Glucose-Capillary: 191 mg/dL — ABNORMAL HIGH (ref 70–99)
Glucose-Capillary: 115 mg/dL — ABNORMAL HIGH (ref 70–99)

## 2022-08-16 LAB — CULTURE, BLOOD (ROUTINE X 2)
Culture: NO GROWTH
Culture: NO GROWTH

## 2022-08-16 LAB — MAGNESIUM: Magnesium: 1.6 mg/dL — ABNORMAL LOW (ref 1.7–2.4)

## 2022-08-16 LAB — CORTISOL: Cortisol, Plasma: 1.5 ug/dL

## 2022-08-16 LAB — PROCALCITONIN: Procalcitonin: 4.81 ng/mL

## 2022-08-16 LAB — BRAIN NATRIURETIC PEPTIDE: B Natriuretic Peptide: 821.7 pg/mL — ABNORMAL HIGH (ref 0.0–100.0)

## 2022-08-16 LAB — TSH: TSH: 1.071 u[IU]/mL (ref 0.350–4.500)

## 2022-08-16 LAB — LACTIC ACID, PLASMA
Lactic Acid, Venous: 1.7 mmol/L (ref 0.5–1.9)
Lactic Acid, Venous: 1.5 mmol/L (ref 0.5–1.9)

## 2022-08-16 LAB — C-REACTIVE PROTEIN: CRP: 7.6 mg/dL — ABNORMAL HIGH (ref <1.0)

## 2022-08-16 MED ORDER — MELATONIN 5 MG PO TABS
5.0000 mg | ORAL_TABLET | Freq: Every evening | ORAL | Status: DC | PRN
  Administered 2022-08-16 – 2022-08-18 (×3): 5 mg via ORAL
  Filled 2022-08-16 (×3): qty 1

## 2022-08-16 MED ORDER — MIDODRINE HCL 5 MG PO TABS
5.0000 mg | ORAL_TABLET | Freq: Three times a day (TID) | ORAL | Status: DC
  Administered 2022-08-16 (×3): 5 mg via ORAL
  Filled 2022-08-16 (×3): qty 1

## 2022-08-16 MED ORDER — LACTATED RINGERS IV BOLUS
1000.0000 mL | Freq: Once | INTRAVENOUS | Status: AC
  Administered 2022-08-16: 1000 mL via INTRAVENOUS

## 2022-08-16 MED ORDER — MAGNESIUM SULFATE 2 GM/50ML IV SOLN
2.0000 g | Freq: Once | INTRAVENOUS | Status: AC
  Administered 2022-08-16: 2 g via INTRAVENOUS
  Filled 2022-08-16: qty 50

## 2022-08-16 MED ORDER — HEPARIN SODIUM (PORCINE) 5000 UNIT/ML IJ SOLN
5000.0000 [IU] | Freq: Three times a day (TID) | INTRAMUSCULAR | Status: DC
  Administered 2022-08-16 – 2022-08-18 (×8): 5000 [IU] via SUBCUTANEOUS
  Filled 2022-08-16 (×8): qty 1

## 2022-08-16 MED ORDER — HYDROCORTISONE SOD SUC (PF) 100 MG IJ SOLR
100.0000 mg | Freq: Three times a day (TID) | INTRAMUSCULAR | Status: DC
  Administered 2022-08-16 – 2022-08-18 (×6): 100 mg via INTRAVENOUS
  Filled 2022-08-16 (×7): qty 2

## 2022-08-16 NOTE — Progress Notes (Signed)
   08/15/22 2347  Assess: MEWS Score  Temp 98 F (36.7 C)  BP (!) 72/53  MAP (mmHg) (!) 61  Pulse Rate (!) 57  ECG Heart Rate (!) 58  Resp 18  Level of Consciousness Alert  SpO2 99 %  O2 Device Room Air  Patient Activity (if Appropriate) In bed  Assess: MEWS Score  MEWS Temp 0  MEWS Systolic 2  MEWS Pulse 0  MEWS RR 0  MEWS LOC 0  MEWS Score 2  MEWS Score Color Yellow  Assess: if the MEWS score is Yellow or Red  Were vital signs taken at a resting state? Yes  Focused Assessment Change from prior assessment (see assessment flowsheet)  Does the patient meet 2 or more of the SIRS criteria? No  MEWS guidelines implemented  Yes, yellow  Treat  MEWS Interventions Considered administering scheduled or prn medications/treatments as ordered  Take Vital Signs  Increase Vital Sign Frequency  Yellow: Q2hr x1, continue Q4hrs until patient remains green for 12hrs  Escalate  MEWS: Escalate Yellow: Discuss with charge nurse and consider notifying provider and/or RRT  Notify: Charge Nurse/RN  Name of Charge Nurse/RN Notified [facility] RN  Provider Notification  Provider Name/Title Dr.Opyd  Date Provider Notified 08/15/22  Time Provider Notified 2355  Method of Notification Page  Notification Reason Change in status (Pt BP dropped to 70's/50's and HR dropped to 50's.)  Provider response See new orders (Stop Amio drip and start LR bolus)  Date of Provider Response 08/16/22  Time of Provider Response 0007  Assess: SIRS CRITERIA  SIRS Temperature  0  SIRS Pulse 0  SIRS Respirations  0  SIRS WBC 0  SIRS Score Sum  0

## 2022-08-16 NOTE — Progress Notes (Signed)
  Echocardiogram 2D Echocardiogram has been performed.  [name] 08/16/2022, 9:43 AM

## 2022-08-16 NOTE — Progress Notes (Signed)
Dr. Candiss Norse ok with B/P as long as MAP stays >65.

## 2022-08-16 NOTE — Progress Notes (Signed)
Patient become bradycardic and hypotensive on amiodarone infusion. Plan to stop amio and give IV fluid bolus.

## 2022-08-16 NOTE — Progress Notes (Addendum)
PROGRESS NOTE        PATIENT DETAILS Name: James Coffey Age: 52 y.o. Sex: male Date of Birth: [birth year] Admit Date: 08/10/2022 Admitting Physician Freddi Starr, MD [id number], No Pcp Per  Brief Summary: Patient is a 52 y.o.  male history of metastatic colon cancer-s/p colectomy-s/p recent craniotomy with tumor resection 2/28-who presented with septic shock in the setting of emphysematous cystitis/pyelonephritis and E. coli bacteremia.  Significant events: 2/28>> craniotomy 3/01>> discharged from [facility] after craniotomy 3/05>> admit to ICU by PCCM with septic shock/AKI 3/08>> off pressors, no vesicle intestinal fistula on CT cystogram. 3/09>> transfer to [facility]  Significant studies: 3/04>> CT head: Postoperative changes in the left frontal craniotomy 3/04>> CT chest/abdomen/pelvis: Gas in the urinary bladder wall-concerning for emphysematous cystitis, mild bilateral hydroureteral nephrosis at the level of the urinary bladder, similar appearance of the fluid/gas collection in the presacral/mesorectal space.  New patchy bilateral groundglass opacities. 3/07>> CT cystogram abdomen/pelvis: No evidence of vesicle intestinal fistula, 4.2 x 6.4 cm and necrotic presacral mass-at the site of prior fluid collection-s/p drainage-favoring residual/recurrent rectal tumor rather than a fluid collection.  Progressive hepatic metastases.  Significant microbiology data: 3/04>> COVID/influenza/RSV PCR: Negative 3/04>> blood cultures: E. Coli 3/04>> urine culture: E. coli, Enterococcus 3/05>> blood cultures: No growth  Procedures: None  Consults: PCCM General surgery Infectious disease Urology  Subjective:  Patient in bed, appears comfortable, denies any headache, no fever, no chest pain or pressure, no shortness of breath , no abdominal pain. No new focal weakness.  Feels generalized weakness but overall still okay,   Objective: Vitals: Blood pressure 94/61,  pulse (!) 54, temperature 98 F (36.7 C), temperature source Oral, resp. rate 20, height '5\' 9"'$  (1.753 m), weight 76.9 kg, SpO2 99 %.   Exam:  Awake Alert, No new F.N deficits, Normal affect Cranial post op scar stable, R.P.Cath, LLQ Colostomy, Foley Supple Neck, No JVD,   Symmetrical Chest wall movement, Good air movement bilaterally, CTAB RRR,No Gallops, Rubs or new Murmurs,  +ve B.Sounds, Abd Soft, No tenderness,   No Cyanosis, Clubbing or edema    Assessment/Plan:  Septic shock due to E. coli bacteremia and E. coli emphysematous cystitis Sepsis pathophysiology had initially improved, hypotensive again morning of 08/16/2022, 2 L of LR, midodrine, stress dose steroids, cortisol despite being hypotensive was 1.5, will check lactic acid also and monitor closely.  May require going back to ICU if does not improve. Continue Unasyn per ID  Acute hypoxic respiratory failure likely due to PNA Improving-on minimal amount of oxygen Continue pulmonary toileting On IV Unasyn  A-fib RVR this odes of bradycardia on night of 08/15/2022 vas 2 score of greater than 3. Rate controlled was on amiodarone stopped on 08/15/2022 night due to episodes of bradycardia Not a candidate for anticoagulation currently given severe thrombocytopenia TSH stable Check echo   AKI Hemodynamically mediated in the setting of septic shock Resolved with supportive care Foley catheter remains in place-Per urology consult note-leave Foley catheter in place-until repeat imaging shows resolution of presenting issues  Transaminitis Probably shock liver Check transaminases with a.m. labs  Thrombocytopenia Secondary to septic shock Improving Follow CBC  Normocytic anemia Due to critical illness Follow CBC Transfuse if Hb <7  Presacral fluid fluid collection General surgery has no plans for drainage/biopsy-as this is felt to be a residual collection  Recent left frontal craniotomy  with tumor resection-2/28 (Dr.  [name]) Per chart review-has completed a short course of steroids/Keppra  Chronic pain syndrome Continue as needed narcotics  Metastatic rectal adenocarcinoma 11/30/2020 - rectal biopsies confirm adenocarcinoma  12/13/2020 - descending loop colostomy, Dr. Harlow Asa S/p neoadjuvant FOLFOX chemotherapy 08/28/2021- open left hepatectomy Dr. [name] 10/2021- ablation of a right liver metastasis  S/p chemotherapy and radiation therapy of rectal mass S/p radiation therapy of lung lesions 02/04/2022 - robotic APR, parastomal hernia repair Dr. [name]  S/p radiation therapy of lung lesions 10-04/2022 IR drainage of pre-sacral collections, injection studies not significant for fistula to bladder at that time. 07/31/2022 S/p single dose of radiation therapy to the brain 08/05/2022 - left frontal craniotomy for tumor resection, Dr. [name]  Patient currently being considered for Y90 treatments of liver metastasis by IR Dr. Midge Minium w Dr. Irene Limbo  Steroid induced hyperglycemia (A1c 5.51 03/16/2022) On sliding scale monitor  Lab Results  Component Value Date   HGBA1C 5.5 03/16/2022   CBG (last 3)  Recent Labs    08/15/22 1632 08/15/22 2039 08/16/22 0759  GLUCAP 60* 118* 110*    BMI: Estimated body mass index is 25.04 kg/m as calculated from the following:   Height as of this encounter: '5\' 9"'$  (1.753 m).   Weight as of this encounter: 76.9 kg.   Code status:   Code Status: Full Code   DVT Prophylaxis: SCDs Start: 08/10/22 1304 Placed on subcu heparin and monitor clinically along with his platelet count.   Family Communication: None at bedside   Disposition Plan: Status is: Inpatient Remains inpatient appropriate because: Severity of illness   Planned Discharge Destination:Skilled nursing facility versus home health   Diet: Diet Order             Diet regular Room service appropriate? Yes; Fluid consistency: Thin  Diet effective now                     MEDICATIONS: Scheduled Meds:  Chlorhexidine Gluconate Cloth  6 each Topical Daily   hydrocortisone sod succinate (SOLU-CORTEF) inj  100 mg Intravenous Q8H   insulin aspart  0-15 Units Subcutaneous TID WC   insulin aspart  0-5 Units Subcutaneous QHS   insulin aspart  3 Units Subcutaneous TID WC   insulin detemir  6 Units Subcutaneous BID   midodrine  5 mg Oral TID WC   pantoprazole  40 mg Oral Daily   Continuous Infusions:  sodium chloride 10 mL/hr at 08/15/22 0600   ampicillin-sulbactam (UNASYN) IV 3 g (08/16/22 0814)   magnesium sulfate bolus IVPB     PRN Meds:.acetaminophen, albuterol, docusate sodium, guaiFENesin-codeine, HYDROmorphone (DILAUDID) injection, ondansetron (ZOFRAN) IV, mouth rinse, oxyCODONE-acetaminophen, polyethylene glycol   I have personally reviewed following labs and imaging studies  LABORATORY DATA:  Recent Labs  Lab 08/10/22 0809 08/10/22 0853 08/11/22 1216 08/13/22 0633 08/14/22 1110 08/15/22 0324 08/16/22 0333  WBC 4.1   < > 9.3 3.6* 5.7 6.3 10.4  HGB 8.3*   < > 7.4* 7.6* 9.4* 8.3* 8.0*  HCT 25.2*   < > 22.7* 22.7* 30.2* 25.4* 24.8*  PLT 46*   < > 21* 20* 38* 45* 64*  MCV 86.9   < > 86.3 85.7 89.9 87.6 88.6  MCH 28.6   < > 28.1 28.7 28.0 28.6 28.6  MCHC 32.9   < > 32.6 33.5 31.1 32.7 32.3  RDW 22.4*   < > 22.5* 22.5* 23.0* 23.1* 23.5*  LYMPHSABS 0.1*  --   --   --   --   --  0.7  MONOABS 0.0*  --   --   --   --   --  0.2  EOSABS 0.0  --   --   --   --   --  0.0  BASOSABS 0.0  --   --   --   --   --  0.0   < > = values in this interval not displayed.    Recent Labs  Lab 08/10/22 0809 08/10/22 0853 08/10/22 1007 08/10/22 2301 08/11/22 0336 08/13/22 [id number] 08/14/22 1110 08/15/22 0324 08/16/22 0333 08/16/22 0618  NA 129*   < >  --  135 136 138 143 140 141  --   K 4.5   < >  --  3.7 3.5 3.6 4.2 4.0 4.0  --   CL 98   < >  --  103 102 106 107 106 108  --   CO2 17*  --   --  20* 19* '22 24 25 27  '$ --   ANIONGAP 14  --   --  '12 15 10 12  9 6  '$ --   GLUCOSE 132*   < >  --  175* 150* 275* 170* 193* 96  --   BUN 47*   < >  --  36* 35* 43* 34* 27* 23*  --   CREATININE 2.77*   < >  --  1.73* 1.56* 0.87 0.91 0.84 0.74  --   AST 61*  --   --   --  472*  --   --   --  32  --   ALT 47*  --   --   --  340*  --   --   --  120*  --   ALKPHOS 262*  --   --   --  109  --   --   --  165*  --   BILITOT 2.2*  --   --   --  3.5*  --   --   --  2.9*  --   ALBUMIN 1.5*  --   --   --  3.3*  --   --   --  2.0*  --   CRP  --   --   --   --   --   --   --   --   --  7.6*  PROCALCITON 130.49  --   --   --  75.91  --   --   --   --  4.81  LATICACIDVEN 8.6*  --  5.3*  --  4.5*  --   --   --   --   --   INR 1.6*  --   --   --   --   --   --   --   --   --   TSH  --   --   --   --   --   --   --  0.577 1.071  --   AMMONIA  --   --   --   --  29  --   --   --   --   --   BNP  --   --   --   --   --   --   --   --  821.7*  --   MG 1.5*  --   --  3.1* 3.1*  --  1.9 1.8 1.6*  --   CALCIUM 7.5*  --   --  8.3* 8.5* 8.5* 9.0 8.5* 8.3*  --    < > = values in this interval not displayed.      Recent Labs  Lab 08/10/22 0809 08/10/22 1007 08/10/22 2301 08/11/22 0336 08/13/22 [id number] 08/14/22 1110 08/15/22 0324 08/16/22 0333 08/16/22 0618  CRP  --   --   --   --   --   --   --   --  7.6*  PROCALCITON 130.49  --   --  75.91  --   --   --   --  4.81  LATICACIDVEN 8.6* 5.3*  --  4.5*  --   --   --   --   --   INR 1.6*  --   --   --   --   --   --   --   --   TSH  --   --   --   --   --   --  0.577 1.071  --   AMMONIA  --   --   --  29  --   --   --   --   --   BNP  --   --   --   --   --   --   --  821.7*  --   MG 1.5*  --  3.1* 3.1*  --  1.9 1.8 1.6*  --   CALCIUM 7.5*  --  8.3* 8.5* 8.5* 9.0 8.5* 8.3*  --       RADIOLOGY STUDIES/RESULTS: DG Chest Port 1 View  Result Date: 08/16/2022 CLINICAL DATA:  52 year old male with shortness of breath. Metastatic colon cancer. EXAM: PORTABLE CHEST 1 VIEW COMPARISON:  CT Abdomen and Pelvis 08/13/2022.  FINDINGS: Portable AP semi upright view at 0736 hours. Stable right chest power port, accessed. Lung volumes and mediastinal contours have not significantly changed since 08/11/2022. Visualized tracheal air column is within normal limits. No pneumothorax. Asymmetric Patchy and confluent left greater than right lung opacity. Small superimposed layering pleural effusions demonstrated by CT on 08/13/2022. Lung base airspace opacity on that exam also. Ventilation has mildly improved since 08/11/2022. No pneumothorax. Stable visualized osseous structures. IMPRESSION: 1. Improved but unresolved patchy and confluent left greater than right nonspecific pulmonary opacity since 08/11/2022. This could reflect pulmonary infection or inflammation (including but not limited to drug reaction). Small pleural effusions recently demonstrated on CT likely persist. 2. No new cardiopulmonary abnormality. Electronically Signed   By: Genevie Ann M.D.   On: 08/16/2022 08:06     LOS: 6 days   Signature  -    Lala Lund M.D on 08/16/2022 at 11:07 AM   -  To page go to [email or web address]

## 2022-08-16 NOTE — Progress Notes (Signed)
Brief Palliative Medicine Progress Note:  PMT following peripherally for needs/decline.  Patient has very clearly insisted that he no longer wants PMT to be involved in his care.  Medical records reviewed including progress notes, labs, imaging.  Chest x-ray today shows improved but unresolved pulmonary opacity, left greater than right.  Noted patient's bradycardia and hypotension overnight as well.  Goals of care are clear for full code/full scope treatment.  Patient and family have been provided with PMT contact information if additional palliative support is desired.  Plan is for follow-up with outpatient palliative care in the cancer center after discharge.  PMT to continue to follow peripherally.  Thank you for allowing PMT to assist in the care of this patient.  Dorthy Cooler, PA-C Palliative Medicine Team Team Phone: [phone] NO CHARGE

## 2022-08-17 ENCOUNTER — Other Ambulatory Visit (HOSPITAL_COMMUNITY): Payer: Self-pay

## 2022-08-17 ENCOUNTER — Other Ambulatory Visit: Payer: Self-pay | Admitting: Internal Medicine

## 2022-08-17 DIAGNOSIS — C787 Secondary malignant neoplasm of liver and intrahepatic bile duct: Secondary | ICD-10-CM

## 2022-08-17 DIAGNOSIS — C20 Malignant neoplasm of rectum: Secondary | ICD-10-CM

## 2022-08-17 LAB — GLUCOSE, CAPILLARY
Glucose-Capillary: 272 mg/dL — ABNORMAL HIGH (ref 70–99)
Glucose-Capillary: 134 mg/dL — ABNORMAL HIGH (ref 70–99)
Glucose-Capillary: 120 mg/dL — ABNORMAL HIGH (ref 70–99)
Glucose-Capillary: 226 mg/dL — ABNORMAL HIGH (ref 70–99)
Glucose-Capillary: 188 mg/dL — ABNORMAL HIGH (ref 70–99)
Glucose-Capillary: 92 mg/dL (ref 70–99)

## 2022-08-17 LAB — COMPREHENSIVE METABOLIC PANEL
ALT: 89 U/L — ABNORMAL HIGH (ref 0–44)
AST: 30 U/L (ref 15–41)
Albumin: 1.9 g/dL — ABNORMAL LOW (ref 3.5–5.0)
Alkaline Phosphatase: 241 U/L — ABNORMAL HIGH (ref 38–126)
Anion gap: 5 (ref 5–15)
BUN: 20 mg/dL (ref 6–20)
CO2: 27 mmol/L (ref 22–32)
Calcium: 7.9 mg/dL — ABNORMAL LOW (ref 8.9–10.3)
Chloride: 104 mmol/L (ref 98–111)
Creatinine, Ser: 0.7 mg/dL (ref 0.61–1.24)
GFR, Estimated: >60 mL/min (ref >60)
Glucose, Bld: 135 mg/dL — ABNORMAL HIGH (ref 70–99)
Potassium: 3.6 mmol/L (ref 3.5–5.1)
Sodium: 136 mmol/L (ref 135–145)
Total Bilirubin: 2.8 mg/dL — ABNORMAL HIGH (ref 0.3–1.2)
Total Protein: 4.5 g/dL — ABNORMAL LOW (ref 6.5–8.1)

## 2022-08-17 LAB — CBC WITH DIFFERENTIAL/PLATELET
Abs Immature Granulocytes: 0.17 10*3/uL — ABNORMAL HIGH (ref 0.00–0.07)
Basophils Absolute: 0 10*3/uL (ref 0.0–0.1)
Basophils Relative: 0 %
Eosinophils Absolute: 0 10*3/uL (ref 0.0–0.5)
Eosinophils Relative: 0 %
HCT: 24.5 % — ABNORMAL LOW (ref 39.0–52.0)
Hemoglobin: 7.6 g/dL — ABNORMAL LOW (ref 13.0–17.0)
Immature Granulocytes: 2 %
Lymphocytes Relative: 5 %
Lymphs Abs: 0.4 10*3/uL — ABNORMAL LOW (ref 0.7–4.0)
MCH: 28.5 pg (ref 26.0–34.0)
MCHC: 31 g/dL (ref 30.0–36.0)
MCV: 91.8 fL (ref 80.0–100.0)
Monocytes Absolute: 0.1 10*3/uL (ref 0.1–1.0)
Monocytes Relative: 1 %
Neutro Abs: 7.2 10*3/uL (ref 1.7–7.7)
Neutrophils Relative %: 92 %
Platelets: 69 10*3/uL — ABNORMAL LOW (ref 150–400)
RBC: 2.67 MIL/uL — ABNORMAL LOW (ref 4.22–5.81)
RDW: 23.9 % — ABNORMAL HIGH (ref 11.5–15.5)
Smear Review: DECREASED
WBC: 7.8 10*3/uL (ref 4.0–10.5)
nRBC: 1.9 % — ABNORMAL HIGH (ref 0.0–0.2)

## 2022-08-17 LAB — PROCALCITONIN: Procalcitonin: 2.67 ng/mL

## 2022-08-17 LAB — MAGNESIUM: Magnesium: 2 mg/dL (ref 1.7–2.4)

## 2022-08-17 LAB — BRAIN NATRIURETIC PEPTIDE: B Natriuretic Peptide: 1071.9 pg/mL — ABNORMAL HIGH (ref 0.0–100.0)

## 2022-08-17 LAB — C-REACTIVE PROTEIN: CRP: 12.2 mg/dL — ABNORMAL HIGH (ref <1.0)

## 2022-08-17 MED ORDER — MIDODRINE HCL 5 MG PO TABS
10.0000 mg | ORAL_TABLET | Freq: Three times a day (TID) | ORAL | Status: DC
  Administered 2022-08-17 – 2022-08-19 (×8): 10 mg via ORAL
  Filled 2022-08-17 (×8): qty 2

## 2022-08-17 NOTE — Progress Notes (Signed)
PROGRESS NOTE        PATIENT DETAILS Name: James Coffey Age: 52 y.o. Sex: male Date of Birth: 01-Mar-1971 Admit Date: 08/10/2022 Admitting Physician Freddi Starr, MD [id number], No Pcp Per  Brief Summary: Patient is a 52 y.o.  male history of metastatic colon cancer-s/p colectomy-s/p recent craniotomy with tumor resection 2/28-who presented with septic shock in the setting of emphysematous cystitis/pyelonephritis and E. coli bacteremia.  Significant events: 2/28>> craniotomy 3/01>> discharged from [facility] after craniotomy 3/05>> admit to ICU by PCCM with septic shock/AKI 3/08>> off pressors, no vesicle intestinal fistula on CT cystogram. 3/09>> transfer to [facility]  Significant studies: 3/04>> CT head: Postoperative changes in the left frontal craniotomy 3/04>> CT chest/abdomen/pelvis: Gas in the urinary bladder wall-concerning for emphysematous cystitis, mild bilateral hydroureteral nephrosis at the level of the urinary bladder, similar appearance of the fluid/gas collection in the presacral/mesorectal space.  New patchy bilateral groundglass opacities. 3/07>> CT cystogram abdomen/pelvis: No evidence of vesicle intestinal fistula, 4.2 x 6.4 cm and necrotic presacral mass-at the site of prior fluid collection-s/p drainage-favoring residual/recurrent rectal tumor rather than a fluid collection.  Progressive hepatic metastases.  Significant microbiology data: 3/04>> COVID/influenza/RSV PCR: Negative 3/04>> blood cultures: E. Coli 3/04>> urine culture: E. coli, Enterococcus 3/05>> blood cultures: No growth  Procedures: None  Consults: PCCM General surgery Infectious disease Urology  Subjective:  Patient in bed, appears comfortable, denies any headache, no fever, no chest pain or pressure, no shortness of breath , no abdominal pain. No focal weakness.  Objective: Vitals: Blood pressure (!) 88/60, pulse (!) 53, temperature 98.1 F (36.7 C), temperature  source Oral, resp. rate 14, height '5\' 9"'$  (1.753 m), weight 76.9 kg, SpO2 100 %.   Exam:  Awake Alert, No new F.N deficits, Normal affect Cranial post op scar stable, R.P.Cath, LLQ Colostomy, Foley Supple Neck, No JVD,   Symmetrical Chest wall movement, Good air movement bilaterally, CTAB RRR,No Gallops, Rubs or new Murmurs,  +ve B.Sounds, Abd Soft, No tenderness,   No Cyanosis, Clubbing or edema    Assessment/Plan:  Septic shock due to E. coli bacteremia and E. coli emphysematous cystitis Sepsis pathophysiology had initially improved, hypotensive again morning of 08/16/2022, 2 L of LR, midodrine, stress dose steroids, cortisol despite being hypotensive was 1.5, will check lactic acid also and monitor closely.  May require going back to ICU if does not improve. Continue Unasyn per ID  Acute hypoxic respiratory failure likely due to PNA Improving-on minimal amount of oxygen Continue pulmonary toileting On IV Unasyn  A-fib RVR this odes of bradycardia on night of 08/15/2022 vas 2 score of greater than 3. Rate controlled was on amiodarone stopped on 08/15/2022 night due to episodes of bradycardia Not a candidate for anticoagulation currently given severe thrombocytopenia TSH stable Check echo   AKI Hemodynamically mediated in the setting of septic shock Resolved with supportive care Foley catheter remains in place-Per urology consult note-leave Foley catheter in place-until repeat imaging shows resolution of presenting issues  Transaminitis Probably shock liver Check transaminases with a.m. labs  Thrombocytopenia Secondary to septic shock Improving Follow CBC  Normocytic anemia Due to critical illness Follow CBC Transfuse if Hb <7  Presacral fluid fluid collection General surgery has no plans for drainage/biopsy-as this is felt to be a residual collection  Recent left frontal craniotomy with tumor resection-2/28 (Dr. [name]) Per chart review-has completed  a short course  of steroids/Keppra  Chronic pain syndrome Continue as needed narcotics  Metastatic rectal adenocarcinoma 11/30/2020 - rectal biopsies confirm adenocarcinoma  12/13/2020 - descending loop colostomy, Dr. Harlow Asa S/p neoadjuvant FOLFOX chemotherapy 08/28/2021- open left hepatectomy Dr. [name] 10/2021- ablation of a right liver metastasis  S/p chemotherapy and radiation therapy of rectal mass S/p radiation therapy of lung lesions 02/04/2022 - robotic APR, parastomal hernia repair Dr. [name]  S/p radiation therapy of lung lesions 10-04/2022 IR drainage of pre-sacral collections, injection studies not significant for fistula to bladder at that time. 07/31/2022 S/p single dose of radiation therapy to the brain 08/05/2022 - left frontal craniotomy for tumor resection, Dr. [name]  Patient currently being considered for Y90 treatments of liver metastasis by IR Dr. Midge Minium w Dr. Irene Limbo, case discussed with him on 08/17/2022 he expresses that patient has very poor prognosis and unfortunately has poor insight into his condition as well, discussed care plan with patient's daughter on 08/17/2022 who will be here with her sister on 08/18/2022 to discuss long-term plans.  She understands that his overall prognosis is poor.  Steroid induced hyperglycemia (A1c 5.51 03/16/2022) On sliding scale monitor  Lab Results  Component Value Date   HGBA1C 5.5 03/16/2022   CBG (last 3)  Recent Labs    08/16/22 1653 08/16/22 2017 08/17/22 0801  GLUCAP 191* 115* 120*    BMI: Estimated body mass index is 25.04 kg/m as calculated from the following:   Height as of this encounter: '5\' 9"'$  (1.753 m).   Weight as of this encounter: 76.9 kg.   Code status:   Code Status: Full Code   DVT Prophylaxis: heparin injection 5,000 Units Start: 08/16/22 1400 Place and maintain sequential compression device Start: 08/16/22 1110 SCDs Start: 08/10/22 1304 Placed on subcu heparin and monitor clinically along with his  platelet count.   Family Communication: Daughter bedside on 08/17/2022, explained poor prognosis.   Disposition Plan: Status is: Inpatient Remains inpatient appropriate because: Severity of illness   Planned Discharge Destination:Skilled nursing facility versus home health   Diet: Diet Order             Diet regular Room service appropriate? Yes; Fluid consistency: Thin  Diet effective now                    MEDICATIONS: Scheduled Meds:  Chlorhexidine Gluconate Cloth  6 each Topical Daily   heparin injection (subcutaneous)  5,000 Units Subcutaneous Q8H   hydrocortisone sod succinate (SOLU-CORTEF) inj  100 mg Intravenous Q8H   insulin aspart  0-15 Units Subcutaneous TID WC   insulin aspart  0-5 Units Subcutaneous QHS   insulin aspart  3 Units Subcutaneous TID WC   insulin detemir  6 Units Subcutaneous BID   midodrine  10 mg Oral TID WC   pantoprazole  40 mg Oral Daily   Continuous Infusions:  sodium chloride 10 mL/hr at 08/15/22 0600   ampicillin-sulbactam (UNASYN) IV 3 g (08/17/22 0944)   PRN Meds:.acetaminophen, albuterol, docusate sodium, guaiFENesin-codeine, HYDROmorphone (DILAUDID) injection, melatonin, ondansetron (ZOFRAN) IV, mouth rinse, oxyCODONE-acetaminophen, polyethylene glycol   I have personally reviewed following labs and imaging studies  LABORATORY DATA:  Recent Labs  Lab 08/13/22 0633 08/14/22 1110 08/15/22 0324 08/16/22 0333 08/17/22 0212  WBC 3.6* 5.7 6.3 10.4 7.8  HGB 7.6* 9.4* 8.3* 8.0* 7.6*  HCT 22.7* 30.2* 25.4* 24.8* 24.5*  PLT 20* 38* 45* 64* 69*  MCV 85.7 89.9 87.6 88.6 91.8  MCH 28.7  28.0 28.6 28.6 28.5  MCHC 33.5 31.1 32.7 32.3 31.0  RDW 22.5* 23.0* 23.1* 23.5* 23.9*  LYMPHSABS  --   --   --  0.7 0.4*  MONOABS  --   --   --  0.2 0.1  EOSABS  --   --   --  0.0 0.0  BASOSABS  --   --   --  0.0 0.0    Recent Labs  Lab 08/11/22 0336 08/13/22 0633 08/14/22 1110 08/15/22 0324 08/16/22 0333 08/16/22 0618 08/16/22 1117  08/16/22 1418 08/17/22 0212  NA 136 138 143 140 141  --   --   --  136  K 3.5 3.6 4.2 4.0 4.0  --   --   --  3.6  CL 102 106 107 106 108  --   --   --  104  CO2 19* '22 24 25 27  '$ --   --   --  27  ANIONGAP '15 10 12 9 6  '$ --   --   --  5  GLUCOSE 150* 275* 170* 193* 96  --   --   --  135*  BUN 35* 43* 34* 27* 23*  --   --   --  20  CREATININE 1.56* 0.87 0.91 0.84 0.74  --   --   --  0.70  AST 472*  --   --   --  32  --   --   --  30  ALT 340*  --   --   --  120*  --   --   --  89*  ALKPHOS 109  --   --   --  165*  --   --   --  241*  BILITOT 3.5*  --   --   --  2.9*  --   --   --  2.8*  ALBUMIN 3.3*  --   --   --  2.0*  --   --   --  1.9*  CRP  --   --   --   --   --  7.6*  --   --  12.2*  PROCALCITON 75.91  --   --   --   --  4.81  --   --  2.67  LATICACIDVEN 4.5*  --   --   --   --   --  1.7 1.5  --   TSH  --   --   --  0.577 1.071  --   --   --   --   AMMONIA 29  --   --   --   --   --   --   --   --   BNP  --   --   --   --  821.7*  --   --   --  1,071.9*  MG 3.1*  --  1.9 1.8 1.6*  --   --   --  2.0  CALCIUM 8.5* 8.5* 9.0 8.5* 8.3*  --   --   --  7.9*      Recent Labs  Lab 08/11/22 0336 08/13/22 0633 08/14/22 1110 08/15/22 0324 08/16/22 0333 08/16/22 0618 08/16/22 1117 08/16/22 1418 08/17/22 0212  CRP  --   --   --   --   --  7.6*  --   --  12.2*  PROCALCITON 75.91  --   --   --   --  4.81  --   --  2.67  LATICACIDVEN 4.5*  --   --   --   --   --  1.7 1.5  --   TSH  --   --   --  0.577 1.071  --   --   --   --   AMMONIA 29  --   --   --   --   --   --   --   --   BNP  --   --   --   --  821.7*  --   --   --  1,071.9*  MG 3.1*  --  1.9 1.8 1.6*  --   --   --  2.0  CALCIUM 8.5* 8.5* 9.0 8.5* 8.3*  --   --   --  7.9*      RADIOLOGY STUDIES/RESULTS: ECHOCARDIOGRAM COMPLETE  Result Date: 08/16/2022    ECHOCARDIOGRAM REPORT   Patient Name:   TRAVAS ORNDOFF Date of Exam: 08/16/2022 Medical Rec #:  [id number]    Height:       69.0 in Accession #:    [id number]   Weight:        169.5 lb Date of Birth:  23-Mar-1971    BSA:          1.926 m Patient Age:    72 years     BP:           99/72 mmHg Patient Gender: M            HR:           53 bpm. Exam Location:  Inpatient Procedure: 2D Echo, Cardiac Doppler and Color Doppler Indications:    Atrial Fibrillation I48.91  History:        Patient has prior history of Echocardiogram examinations, most                 recent 12/06/2020. Arrythmias:Atrial Fibrillation; Risk                 Factors:Current Smoker.  Sonographer:    Greer Pickerel Referring Phys: [place]  Sonographer Comments: Image acquisition challenging due to patient body habitus and Image acquisition challenging due to respiratory motion. IMPRESSIONS  1. Left ventricular ejection fraction, by estimation, is 45 to 50%. The left ventricle has mildly decreased function. The left ventricle demonstrates global hypokinesis. There is mild left ventricular hypertrophy. Left ventricular diastolic parameters are consistent with Grade II diastolic dysfunction (pseudonormalization). Elevated left atrial pressure.  2. Right ventricular systolic function is normal. The right ventricular size is normal. Tricuspid regurgitation signal is inadequate for assessing PA pressure.  3. Left atrial size was severely dilated.  4. Right atrial size was mildly dilated.  5. The mitral valve is abnormal. Mild to moderate mitral valve regurgitation. No evidence of mitral stenosis.  6. The tricuspid valve is abnormal.  7. The aortic valve has an indeterminant number of cusps. Aortic valve regurgitation is not visualized. No aortic stenosis is present.  8. The inferior vena cava is normal in size with greater than 50% respiratory variability, suggesting right atrial pressure of 3 mmHg. FINDINGS  Left Ventricle: Left ventricular ejection fraction, by estimation, is 45 to 50%. The left ventricle has mildly decreased function. The left ventricle demonstrates global hypokinesis. The left ventricular internal  cavity size was normal in size. There is  mild left ventricular hypertrophy. Left ventricular diastolic parameters are consistent with Grade II diastolic dysfunction (pseudonormalization). Elevated left atrial pressure. Right Ventricle:  The right ventricular size is normal. Right vetricular wall thickness was not well visualized. Right ventricular systolic function is normal. Tricuspid regurgitation signal is inadequate for assessing PA pressure. Left Atrium: Left atrial size was severely dilated. Right Atrium: Right atrial size was mildly dilated. Pericardium: There is no evidence of pericardial effusion. Mitral Valve: The mitral valve is abnormal. Mild to moderate mitral valve regurgitation. No evidence of mitral valve stenosis. Tricuspid Valve: The tricuspid valve is abnormal. Tricuspid valve regurgitation is mild . No evidence of tricuspid stenosis. Aortic Valve: The aortic valve has an indeterminant number of cusps. Aortic valve regurgitation is not visualized. No aortic stenosis is present. Aortic valve mean gradient measures 3.5 mmHg. Aortic valve peak gradient measures 6.4 mmHg. Aortic valve area, by VTI measures 1.57 cm. Pulmonic Valve: The pulmonic valve was not well visualized. Pulmonic valve regurgitation is not visualized. No evidence of pulmonic stenosis. Aorta: The aortic root is normal in size and structure and the ascending aorta was not well visualized. Venous: The inferior vena cava is normal in size with greater than 50% respiratory variability, suggesting right atrial pressure of 3 mmHg. IAS/Shunts: The interatrial septum was not well visualized.  LEFT VENTRICLE PLAX 2D LVIDd:         4.90 cm      Diastology LVIDs:         4.20 cm      LV e' medial:    5.11 cm/s LV PW:         1.10 cm      LV E/e' medial:  18.4 LV IVS:        1.00 cm      LV e' lateral:   8.92 cm/s LVOT diam:     1.80 cm      LV E/e' lateral: 10.5 LV SV:         42 LV SV Index:   22 LVOT Area:     2.54 cm  LV Volumes (MOD) LV  vol d, MOD A2C: 136.0 ml LV vol d, MOD A4C: 134.0 ml LV vol s, MOD A2C: 77.8 ml LV vol s, MOD A4C: 75.6 ml LV SV MOD A2C:     58.2 ml LV SV MOD A4C:     134.0 ml LV SV MOD BP:      59.5 ml RIGHT VENTRICLE RV S prime:     10.60 cm/s TAPSE (M-mode): 2.0 cm LEFT ATRIUM              Index        RIGHT ATRIUM           Index LA diam:        4.70 cm  2.44 cm/m   RA Area:     20.90 cm LA Vol (A2C):   114.0 ml 59.19 ml/m  RA Volume:   61.20 ml  31.78 ml/m LA Vol (A4C):   81.8 ml  42.47 ml/m LA Biplane Vol: 104.0 ml 54.00 ml/m  AORTIC VALVE                    PULMONIC VALVE AV Area (Vmax):    1.72 cm     PR End Diast Vel: 5.76 msec AV Area (Vmean):   1.47 cm AV Area (VTI):     1.57 cm AV Vmax:           126.55 cm/s AV Vmean:          89.453 cm/s AV VTI:  0.271 m AV Peak Grad:      6.4 mmHg AV Mean Grad:      3.5 mmHg LVOT Vmax:         85.40 cm/s LVOT Vmean:        51.700 cm/s LVOT VTI:          0.167 m LVOT/AV VTI ratio: 0.62  AORTA Ao Root diam: 3.10 cm MITRAL VALVE MV Area (PHT): 4.68 cm    SHUNTS MV Decel Time: 162 msec    Systemic VTI:  0.17 m MR Peak grad: 28.7 mmHg    Systemic Diam: 1.80 cm MR Vmax:      268.00 cm/s MV E velocity: 94.00 cm/s MV A velocity: 25.00 cm/s MV E/A ratio:  3.76 Carlyle Dolly MD Electronically signed by Carlyle Dolly MD Signature Date/Time: 08/16/2022/11:32:51 AM    Final    DG Chest Port 1 View  Result Date: 08/16/2022 CLINICAL DATA:  52 year old male with shortness of breath. Metastatic colon cancer. EXAM: PORTABLE CHEST 1 VIEW COMPARISON:  CT Abdomen and Pelvis 08/13/2022. FINDINGS: Portable AP semi upright view at 0736 hours. Stable right chest power port, accessed. Lung volumes and mediastinal contours have not significantly changed since 08/11/2022. Visualized tracheal air column is within normal limits. No pneumothorax. Asymmetric Patchy and confluent left greater than right lung opacity. Small superimposed layering pleural effusions demonstrated by CT on  08/13/2022. Lung base airspace opacity on that exam also. Ventilation has mildly improved since 08/11/2022. No pneumothorax. Stable visualized osseous structures. IMPRESSION: 1. Improved but unresolved patchy and confluent left greater than right nonspecific pulmonary opacity since 08/11/2022. This could reflect pulmonary infection or inflammation (including but not limited to drug reaction). Small pleural effusions recently demonstrated on CT likely persist. 2. No new cardiopulmonary abnormality. Electronically Signed   By: Genevie Ann M.D.   On: 08/16/2022 08:06     LOS: 7 days   Signature  -    Lala Lund M.D on 08/17/2022 at 12:19 PM   -  To page go to [email or web address]

## 2022-08-17 NOTE — Evaluation (Signed)
Occupational Therapy Evaluation Patient Details Name: James Coffey MRN: [id number] DOB: [birth year] Today's Date: 08/17/2022   History of Present Illness 52 y.o. male who presents on 08/10/22 with fever x several days. Was admitted with septic shock, acute respiratory failure, AKI, encephalopathy. PMH includes rectal cancer with mets to liver (L lateral hepatectomy 08/28/21), lungs, and brain (L frontal craniotomy for tumor resection on 08/05/22), fournier gangrene, perianal abscess, loop colostomy.   Clinical Impression   Pt was ambulating independently and receiving help for bathing and dressing, primarily LB, and all IADLs prior to admission. Daughter in room and interpreting. Pt minimizing any difficulties he may have at home and need for DME. Pt with c/o B LE pain. He stood from chair with min guard assist and RW and took several steps with min assist. He fatigues easily. Pt requires set up to max assist for ADLs. Educated pt and daughter on multiple uses of 3 in 1 and benefits of tub transfer bench. Also recommended w/c for longer distances.      Recommendations for follow up therapy are one component of a multi-disciplinary discharge planning process, led by the attending physician.  Recommendations may be updated based on patient status, additional functional criteria and insurance authorization.   Follow Up Recommendations  Home health OT     Assistance Recommended at Discharge Frequent or constant Supervision/Assistance  Patient can return home with the following A little help with walking and/or transfers;A lot of help with bathing/dressing/bathroom;Assistance with cooking/housework;Direct supervision/assist for medications management;Direct supervision/assist for financial management;Assist for transportation;Help with stairs or ramp for entrance    Functional Status Assessment  Patient has had a recent decline in their functional status and demonstrates the ability to make significant  improvements in function in a reasonable and predictable amount of time.  Equipment Recommendations  BSC/3in1;Tub/shower bench;Wheelchair (measurements OT);Wheelchair cushion (measurements OT);Other (comment) (RW)    Recommendations for Other Services       Precautions / Restrictions Precautions Precautions: Fall Restrictions Weight Bearing Restrictions: No Other Position/Activity Restrictions: colostomy, recent crani      Mobility Bed Mobility               General bed mobility comments: received in chair    Transfers Overall transfer level: Needs assistance Equipment used: Rolling walker (2 wheels) Transfers: Sit to/from Stand Sit to Stand: Min guard           General transfer comment: cues for hand placement      Balance Overall balance assessment: Needs assistance   Sitting balance-Leahy Scale: Fair       Standing balance-Leahy Scale: Poor                             ADL either performed or assessed with clinical judgement   ADL Overall ADL's : Needs assistance/impaired Eating/Feeding: Independent;Sitting   Grooming: Set up;Sitting   Upper Body Bathing: Minimal assistance;Sitting   Lower Body Bathing: Maximal assistance;Sit to/from stand   Upper Body Dressing : Set up;Sitting   Lower Body Dressing: Maximal assistance;Sit to/from stand   Toilet Transfer: Minimal assistance;Rolling walker (2 wheels)           Functional mobility during ADLs: Minimal assistance;Rolling walker (2 wheels) General ADL Comments: fatigues easily, educated pt and daughter in benefits and multiple uses of 3 in 1, tub bench     Vision Baseline Vision/History: 0 No visual deficits       Perception  Praxis      Pertinent Vitals/Pain Pain Assessment Pain Assessment: Faces Faces Pain Scale: Hurts even more Pain Location: LEs Pain Descriptors / Indicators: Grimacing, Guarding, Discomfort Pain Intervention(s): Repositioned, Limited activity  within patient's tolerance     Hand Dominance Right   Extremity/Trunk Assessment Upper Extremity Assessment Upper Extremity Assessment: Generalized weakness   Lower Extremity Assessment Lower Extremity Assessment: Defer to PT evaluation   Cervical / Trunk Assessment Cervical / Trunk Assessment: Normal   Communication Communication Communication: Prefers language other than [place] Writer, daughter interpreted)   Cognition Arousal/Alertness: Awake/alert Behavior During Therapy: Flat affect Overall Cognitive Status: Impaired/Different from baseline Area of Impairment: Safety/judgement                         Safety/Judgement: Decreased awareness of safety, Decreased awareness of deficits     General Comments: pt stating he did not need any DME, does not appear to appreciate the gravity of his illness     General Comments       Exercises     Shoulder Instructions      Home Living   Living Arrangements: Spouse/significant other;Children Available Help at Discharge: Family;Available 24 hours/day Type of Home: Apartment Home Access: Level entry     Home Layout: One level               Home Equipment: None          Prior Functioning/Environment Prior Level of Function : Needs assist               ADLs Comments: wife has been helping with bathing and dressing and all IADLs        OT Problem List: Decreased strength;Decreased activity tolerance;Impaired balance (sitting and/or standing);Decreased knowledge of use of DME or AE;Decreased safety awareness;Decreased cognition;Pain      OT Treatment/Interventions: Self-care/ADL training;DME and/or AE instruction;Therapeutic activities;Patient/family education;Balance training;Cognitive remediation/compensation    OT Goals(Current goals can be found in the care plan section) Acute Rehab OT Goals OT Goal Formulation: With patient/family Time For Goal Achievement: 08/31/22 Potential to  Achieve Goals: Good ADL Goals Pt Will Perform Grooming: with min guard assist;standing Pt Will Perform Upper Body Bathing: with set-up;sitting Pt Will Perform Upper Body Dressing: with set-up;sitting Pt Will Transfer to Toilet: with supervision;ambulating;bedside commode Pt Will Perform Toileting - Clothing Manipulation and hygiene: with supervision;sit to/from stand Pt Will Perform Tub/Shower Transfer: Tub transfer;with min assist;ambulating;tub bench;rolling walker Additional ADL Goal #1: Pt will generalize energy conservation strategies in ADLs and mobility.  OT Frequency: Min 2X/week    Co-evaluation              AM-PAC OT "6 Clicks" Daily Activity     Outcome Measure Help from another person eating meals?: None Help from another person taking care of personal grooming?: A Little Help from another person toileting, which includes using toliet, bedpan, or urinal?: A Lot Help from another person bathing (including washing, rinsing, drying)?: A Little Help from another person to put on and taking off regular upper body clothing?: A Lot   6 Click Score: 14   End of Session Equipment Utilized During Treatment: Rolling walker (2 wheels);Gait belt Nurse Communication: Other (comment) (daughter asking whether foley will remain)  Activity Tolerance: Patient tolerated treatment well Patient left: in chair;with call bell/phone within reach;with chair alarm set;with family/visitor present  OT Visit Diagnosis: Unsteadiness on feet (R26.81);Other abnormalities of gait and mobility (R26.89);Pain;Muscle weakness (generalized) (M62.81);Other (comment) (decreased activity  tolerance)                Time: [id number] OT Time Calculation (min): 21 min Charges:  OT General Charges $OT Visit: 1 Visit OT Evaluation $OT Eval Moderate Complexity: [place], OTR/L Acute Rehabilitation Services Office: [phone]   Malka So 08/17/2022, 12:41 PM

## 2022-08-17 NOTE — Progress Notes (Signed)
Physical Therapy Treatment Patient Details Name: James Coffey MRN: [id number] DOB: 10/23/1970 Today's Date: 08/17/2022   History of Present Illness 52 y.o. male who presents on 08/10/22 with fever x several days. Was admitted with septic shock, acute respiratory failure, AKI, encephalopathy. PMH includes rectal cancer with mets to liver (L lateral hepatectomy 08/28/21), lungs, and brain (L frontal craniotomy for tumor resection on 08/05/22), fournier gangrene, perianal abscess, loop colostomy.    PT Comments    Pt tolerated today's session fairly, limited by fatigue and pain, requiring rest breaks after bed mobility, sit>stand, and transfer to chair. Attempted to get pt to ambulate further or perform sit<>stand transfers from chair, but pt declining at this time. Pt requiring modA for bed mobility and power-up to standing, with minA for transfer to chair with use of RW. Updated DME recs for discharge as pt would benefit from a wheelchair for further mobility, only tolerating ambulating brief distances at this time. Per conversation with OT, pt's family agreeable to support pt at home, so discharge recommendations remain appropriate. Acute PT will continue to follow up with pt to progress mobility.     Recommendations for follow up therapy are one component of a multi-disciplinary discharge planning process, led by the attending physician.  Recommendations may be updated based on patient status, additional functional criteria and insurance authorization.  Follow Up Recommendations  Home health PT     Assistance Recommended at Discharge Intermittent Supervision/Assistance  Patient can return home with the following A little help with walking and/or transfers;A little help with bathing/dressing/bathroom;Assistance with cooking/housework;Help with stairs or ramp for entrance;Assist for transportation   Equipment Recommendations  BSC/3in1;Wheelchair (measurements PT);Wheelchair cushion (measurements  PT);Other (comment) (tub bench)    Recommendations for Other Services       Precautions / Restrictions Precautions Precautions: Fall Restrictions Weight Bearing Restrictions: No Other Position/Activity Restrictions: colostomy, recent crani     Mobility  Bed Mobility Overal bed mobility: Needs Assistance Bed Mobility: Supine to Sit     Supine to sit: HOB elevated, Mod assist     General bed mobility comments: use of bed rails, assist for trunk support    Transfers Overall transfer level: Needs assistance Equipment used: Rolling walker (2 wheels) Transfers: Sit to/from Stand, Bed to chair/wheelchair/BSC Sit to Stand: Mod assist   Step pivot transfers: Min assist       General transfer comment: modA to stand for power-up and steady, minA for balance with transfer    Ambulation/Gait Ambulation/Gait assistance: Min assist Gait Distance (Feet): 3 Feet Assistive device: Rolling walker (2 wheels) Gait Pattern/deviations: Step-to pattern, Decreased stride length, Knee flexed in stance - right, Knee flexed in stance - left, Trunk flexed Gait velocity: decreased     General Gait Details: increased time to step to the chair and minA provided for balance, trunk flexed with B knee flexion cued for upright posture   Stairs             Wheelchair Mobility    Modified Rankin (Stroke Patients Only)       Balance Overall balance assessment: Needs assistance Sitting-balance support: Bilateral upper extremity supported, Feet supported Sitting balance-Leahy Scale: Fair     Standing balance support: Bilateral upper extremity supported, During functional activity Standing balance-Leahy Scale: Poor Standing balance comment: reliant on RW and assist for balance                            Cognition Arousal/Alertness:  Awake/alert Behavior During Therapy: Flat affect Overall Cognitive Status: Impaired/Different from baseline Area of Impairment:  Safety/judgement                         Safety/Judgement: Decreased awareness of safety, Decreased awareness of deficits     General Comments: pt requiring increased time with mobility but attempting to decline use of RW for transfer, requiring assist to stand today        Exercises      General Comments General comments (skin integrity, edema, etc.): vitals stable with transfer but increased time required for rest breaks after mobility, pt on room air today      Pertinent Vitals/Pain Pain Assessment Pain Assessment: Faces Faces Pain Scale: Hurts little more Pain Location: generalized Pain Descriptors / Indicators: Grimacing, Guarding, Discomfort Pain Intervention(s): Monitored during session, Limited activity within patient's tolerance, Repositioned    Home Living   Living Arrangements: Spouse/significant other;Children Available Help at Discharge: Family;Available 24 hours/day Type of Home: Apartment Home Access: Level entry       Home Layout: One level Home Equipment: None      Prior Function            PT Goals (current goals can now be found in the care plan section) Acute Rehab PT Goals Patient Stated Goal: to go home PT Goal Formulation: With patient Time For Goal Achievement: 08/25/22 Potential to Achieve Goals: Good Progress towards PT goals: Progressing toward goals    Frequency    Min 3X/week      PT Plan Current plan remains appropriate    Co-evaluation              AM-PAC PT "6 Clicks" Mobility   Outcome Measure  Help needed turning from your back to your side while in a flat bed without using bedrails?: A Little Help needed moving from lying on your back to sitting on the side of a flat bed without using bedrails?: A Lot Help needed moving to and from a bed to a chair (including a wheelchair)?: A Lot Help needed standing up from a chair using your arms (e.g., wheelchair or bedside chair)?: A Lot Help needed to walk in  hospital room?: A Little Help needed climbing 3-5 steps with a railing? : A Lot 6 Click Score: 14    End of Session Equipment Utilized During Treatment: Gait belt Activity Tolerance: Patient limited by fatigue Patient left: in chair;with call bell/phone within reach;with chair alarm set Nurse Communication: Mobility status PT Visit Diagnosis: Muscle weakness (generalized) (M62.81);Difficulty in walking, not elsewhere classified (R26.2);Unsteadiness on feet (R26.81)     Time: [id number] PT Time Calculation (min) (ACUTE ONLY): 16 min  Charges:  $Therapeutic Activity: 8-22 mins                     Charlynne Cousins, PT DPT Acute Rehabilitation Services Office [phone]    Luvenia Heller 08/17/2022, 2:35 PM

## 2022-08-18 ENCOUNTER — Ambulatory Visit (HOSPITAL_COMMUNITY): Payer: Medicaid Other

## 2022-08-18 ENCOUNTER — Other Ambulatory Visit (HOSPITAL_COMMUNITY): Payer: Medicaid Other

## 2022-08-18 LAB — CBC WITH DIFFERENTIAL/PLATELET
Abs Immature Granulocytes: 0.12 10*3/uL — ABNORMAL HIGH (ref 0.00–0.07)
Abs Immature Granulocytes: 0.1 10*3/uL — ABNORMAL HIGH (ref 0.00–0.07)
Basophils Absolute: 0 10*3/uL (ref 0.0–0.1)
Basophils Absolute: 0 10*3/uL (ref 0.0–0.1)
Basophils Relative: 0 %
Basophils Relative: 0 %
Eosinophils Absolute: 0 10*3/uL (ref 0.0–0.5)
Eosinophils Absolute: 0 10*3/uL (ref 0.0–0.5)
Eosinophils Relative: 0 %
Eosinophils Relative: 0 %
HCT: 25.5 % — ABNORMAL LOW (ref 39.0–52.0)
HCT: 26.2 % — ABNORMAL LOW (ref 39.0–52.0)
Hemoglobin: 8 g/dL — ABNORMAL LOW (ref 13.0–17.0)
Hemoglobin: 7.9 g/dL — ABNORMAL LOW (ref 13.0–17.0)
Immature Granulocytes: 1 %
Immature Granulocytes: 1 %
Lymphocytes Relative: 7 %
Lymphocytes Relative: 4 %
Lymphs Abs: 0.5 10*3/uL — ABNORMAL LOW (ref 0.7–4.0)
Lymphs Abs: 0.3 10*3/uL — ABNORMAL LOW (ref 0.7–4.0)
MCH: 28.8 pg (ref 26.0–34.0)
MCH: 28.6 pg (ref 26.0–34.0)
MCHC: 31.4 g/dL (ref 30.0–36.0)
MCHC: 30.2 g/dL (ref 30.0–36.0)
MCV: 91.7 fL (ref 80.0–100.0)
MCV: 94.9 fL (ref 80.0–100.0)
Monocytes Absolute: 0.1 10*3/uL (ref 0.1–1.0)
Monocytes Absolute: 0.1 10*3/uL (ref 0.1–1.0)
Monocytes Relative: 1 %
Monocytes Relative: 1 %
Neutro Abs: 7.5 10*3/uL (ref 1.7–7.7)
Neutro Abs: 7.4 10*3/uL (ref 1.7–7.7)
Neutrophils Relative %: 91 %
Neutrophils Relative %: 94 %
Platelets: 84 10*3/uL — ABNORMAL LOW (ref 150–400)
Platelets: 94 10*3/uL — ABNORMAL LOW (ref 150–400)
RBC: 2.78 MIL/uL — ABNORMAL LOW (ref 4.22–5.81)
RBC: 2.76 MIL/uL — ABNORMAL LOW (ref 4.22–5.81)
RDW: 24.7 % — ABNORMAL HIGH (ref 11.5–15.5)
RDW: 25.7 % — ABNORMAL HIGH (ref 11.5–15.5)
WBC: 8.3 10*3/uL (ref 4.0–10.5)
WBC: 8 10*3/uL (ref 4.0–10.5)
nRBC: 1.3 % — ABNORMAL HIGH (ref 0.0–0.2)
nRBC: 1.2 % — ABNORMAL HIGH (ref 0.0–0.2)

## 2022-08-18 LAB — COMPREHENSIVE METABOLIC PANEL
ALT: 72 U/L — ABNORMAL HIGH (ref 0–44)
AST: 27 U/L (ref 15–41)
Albumin: 1.9 g/dL — ABNORMAL LOW (ref 3.5–5.0)
Alkaline Phosphatase: 246 U/L — ABNORMAL HIGH (ref 38–126)
Anion gap: 9 (ref 5–15)
BUN: 17 mg/dL (ref 6–20)
CO2: 21 mmol/L — ABNORMAL LOW (ref 22–32)
Calcium: 7.8 mg/dL — ABNORMAL LOW (ref 8.9–10.3)
Chloride: 109 mmol/L (ref 98–111)
Creatinine, Ser: 0.64 mg/dL (ref 0.61–1.24)
GFR, Estimated: >60 mL/min (ref >60)
Glucose, Bld: 74 mg/dL (ref 70–99)
Potassium: 3.5 mmol/L (ref 3.5–5.1)
Sodium: 139 mmol/L (ref 135–145)
Total Bilirubin: 2.2 mg/dL — ABNORMAL HIGH (ref 0.3–1.2)
Total Protein: 4.6 g/dL — ABNORMAL LOW (ref 6.5–8.1)

## 2022-08-18 LAB — C-REACTIVE PROTEIN: CRP: 9.1 mg/dL — ABNORMAL HIGH (ref <1.0)

## 2022-08-18 LAB — PROTIME-INR
INR: 1.2 (ref 0.8–1.2)
Prothrombin Time: 15.4 seconds — ABNORMAL HIGH (ref 11.4–15.2)

## 2022-08-18 LAB — PROCALCITONIN: Procalcitonin: 1.62 ng/mL

## 2022-08-18 LAB — GLUCOSE, CAPILLARY
Glucose-Capillary: 83 mg/dL (ref 70–99)
Glucose-Capillary: 297 mg/dL — ABNORMAL HIGH (ref 70–99)
Glucose-Capillary: 100 mg/dL — ABNORMAL HIGH (ref 70–99)
Glucose-Capillary: 97 mg/dL (ref 70–99)

## 2022-08-18 LAB — BRAIN NATRIURETIC PEPTIDE: B Natriuretic Peptide: 1428.7 pg/mL — ABNORMAL HIGH (ref 0.0–100.0)

## 2022-08-18 LAB — MAGNESIUM: Magnesium: 1.7 mg/dL (ref 1.7–2.4)

## 2022-08-18 MED ORDER — POTASSIUM CHLORIDE CRYS ER 20 MEQ PO TBCR
40.0000 meq | EXTENDED_RELEASE_TABLET | Freq: Once | ORAL | Status: AC
  Administered 2022-08-18: 40 meq via ORAL
  Filled 2022-08-18: qty 2

## 2022-08-18 MED ORDER — INSULIN DETEMIR 100 UNIT/ML ~~LOC~~ SOLN
6.0000 [IU] | Freq: Every day | SUBCUTANEOUS | Status: DC
  Administered 2022-08-18 – 2022-08-19 (×2): 6 [IU] via SUBCUTANEOUS
  Filled 2022-08-18 (×2): qty 0.06

## 2022-08-18 MED ORDER — HYDROCORTISONE SOD SUC (PF) 100 MG IJ SOLR
50.0000 mg | Freq: Two times a day (BID) | INTRAMUSCULAR | Status: DC
  Administered 2022-08-18 – 2022-08-19 (×2): 50 mg via INTRAVENOUS
  Filled 2022-08-18 (×3): qty 1

## 2022-08-18 MED ORDER — SODIUM CHLORIDE 0.9 % IV SOLN: INTRAVENOUS | Status: DC

## 2022-08-18 MED ORDER — MAGNESIUM SULFATE 4 GM/100ML IV SOLN
4.0000 g | Freq: Once | INTRAVENOUS | Status: AC
  Administered 2022-08-18: 4 g via INTRAVENOUS
  Filled 2022-08-18: qty 100

## 2022-08-18 MED ORDER — INSULIN ASPART 100 UNIT/ML IJ SOLN
0.0000 [IU] | Freq: Three times a day (TID) | INTRAMUSCULAR | Status: DC
  Administered 2022-08-18: 5 [IU] via SUBCUTANEOUS

## 2022-08-18 NOTE — Progress Notes (Signed)
PROGRESS NOTE        PATIENT DETAILS Name: James Coffey Age: 52 y.o. Sex: male Date of Birth: 09/01/1970 Admit Date: 08/10/2022 Admitting Physician Freddi Starr, MD [id number], No Pcp Per  Brief Summary: Patient is a 52 y.o.  male history of metastatic colon cancer-s/p colectomy-s/p recent craniotomy with tumor resection 2/28-who presented with septic shock in the setting of emphysematous cystitis/pyelonephritis and E. coli bacteremia.  Significant events: 2/28>> craniotomy 3/01>> discharged from [facility] after craniotomy 3/05>> admit to ICU by PCCM with septic shock/AKI 3/08>> off pressors, no vesicle intestinal fistula on CT cystogram. 3/09>> transfer to [facility]  Significant studies: 3/04>> CT head: Postoperative changes in the left frontal craniotomy 3/04>> CT chest/abdomen/pelvis: Gas in the urinary bladder wall-concerning for emphysematous cystitis, mild bilateral hydroureteral nephrosis at the level of the urinary bladder, similar appearance of the fluid/gas collection in the presacral/mesorectal space.  New patchy bilateral groundglass opacities. 3/07>> CT cystogram abdomen/pelvis: No evidence of vesicle intestinal fistula, 4.2 x 6.4 cm and necrotic presacral mass-at the site of prior fluid collection-s/p drainage-favoring residual/recurrent rectal tumor rather than a fluid collection.  Progressive hepatic metastases.  Significant microbiology data: 3/04>> COVID/influenza/RSV PCR: Negative 3/04>> blood cultures: E. Coli 3/04>> urine culture: E. coli, Enterococcus 3/05>> blood cultures: No growth  Procedures:  TTE - 1. Left ventricular ejection fraction, by estimation, is 45 to 50%. The left ventricle has mildly decreased function. The left ventricle demonstrates global hypokinesis. There is mild left ventricular hypertrophy. Left ventricular diastolic parameters are consistent with Grade II diastolic dysfunction (pseudonormalization). Elevated left atrial  pressure.  2. Right ventricular systolic function is normal. The right ventricular size is normal. Tricuspid regurgitation signal is inadequate for assessing PA pressure.  3. Left atrial size was severely dilated.  4. Right atrial size was mildly dilated.  5. The mitral valve is abnormal. Mild to moderate mitral valve regurgitation. No evidence of mitral stenosis.  6. The tricuspid valve is abnormal.  7. The aortic valve has an indeterminant number of cusps. Aortic valve regurgitation is not visualized. No aortic stenosis is present.  8. The inferior vena cava is normal in size with greater than 50% respiratory variability, suggesting right atrial pressure of 3 mmHg  Consults: PCCM General surgery Infectious disease Urology  Subjective:  Patient in bed, appears comfortable, denies any headache, no fever, no chest pain or pressure, no shortness of breath , no abdominal pain. No new focal weakness.   Objective: Vitals: Blood pressure 95/61, pulse (!) 47, temperature 98.3 F (36.8 C), temperature source Oral, resp. rate 15, height '5\' 9"'$  (1.753 m), weight 76.9 kg, SpO2 100 %.   Exam:  Awake Alert, No new F.N deficits, Normal affect Cranial post op scar stable, R.P.Cath, LLQ Colostomy, Foley Supple Neck, No JVD,   Symmetrical Chest wall movement, Good air movement bilaterally, CTAB RRR,No Gallops, Rubs or new Murmurs,  +ve B.Sounds, Abd Soft, No tenderness,   No Cyanosis, Clubbing or edema    Assessment/Plan:  Septic shock due to E. coli bacteremia and E. coli emphysematous cystitis Sepsis pathophysiology had initially improved, hypotensive again morning of 08/16/2022, 2 L of LR, midodrine, stress dose steroids, cortisol despite being hypotensive was 1.5, will check lactic acid also and monitor closely.  May require going back to ICU if does not improve. Continue Unasyn per ID  Acute hypoxic respiratory failure likely due  to PNA Improving-on minimal amount of oxygen Continue pulmonary  toileting On IV Unasyn  A-fib RVR this odes of bradycardia on night of 08/15/2022 vas 2 score of greater than 3. Rate controlled was on amiodarone stopped on 08/15/2022 night due to episodes of bradycardia Not a candidate for anticoagulation currently given severe thrombocytopenia TSH stable Echo noted   AKI Hemodynamically mediated in the setting of septic shock Resolved with supportive care Foley catheter remains in place-Per urology consult note-leave Foley catheter in place-until repeat imaging shows resolution of presenting issues  Transaminitis Probably shock liver Check transaminases with a.m. labs  Thrombocytopenia Secondary to septic shock Improving Follow CBC  Normocytic anemia Due to critical illness Follow CBC Transfuse if Hb <7  Presacral fluid fluid collection General surgery has no plans for drainage/biopsy-as this is felt to be a residual collection  Recent left frontal craniotomy with tumor resection-2/28 (Dr. [name]) Per chart review-has completed a short course of steroids/Keppra  Chronic pain syndrome Continue as needed narcotics  Metastatic rectal adenocarcinoma 11/30/2020 - rectal biopsies confirm adenocarcinoma  12/13/2020 - descending loop colostomy, Dr. Harlow Asa S/p neoadjuvant FOLFOX chemotherapy 08/28/2021- open left hepatectomy Dr. [name] 10/2021- ablation of a right liver metastasis  S/p chemotherapy and radiation therapy of rectal mass S/p radiation therapy of lung lesions 02/04/2022 - robotic APR, parastomal hernia repair Dr. [name]  S/p radiation therapy of lung lesions 10-04/2022 IR drainage of pre-sacral collections, injection studies not significant for fistula to bladder at that time. 07/31/2022 S/p single dose of radiation therapy to the brain 08/05/2022 - left frontal craniotomy for tumor resection, Dr. [name]  Patient currently being considered for Y90 treatments of liver metastasis by IR Dr. Midge Minium w Dr. Irene Limbo, case discussed  with him on 08/17/2022 he expresses that patient has very poor prognosis and unfortunately has poor insight into his condition as well, discussed care plan with patient's daughters on 08/17/2022 & 08/18/2022 understand the poor prognosis and her open for palliative care follow-up at home, this will be initiated on 08/18/2018 for possible discharge home with palliative care on 08/19/2022  Steroid induced hyperglycemia (A1c 5.51 03/16/2022) On sliding scale monitor  Lab Results  Component Value Date   HGBA1C 5.5 03/16/2022   CBG (last 3)  Recent Labs    08/17/22 1616 08/17/22 2123 08/18/22 0800  GLUCAP 188* 92 83    BMI: Estimated body mass index is 25.04 kg/m as calculated from the following:   Height as of this encounter: '5\' 9"'$  (1.753 m).   Weight as of this encounter: 76.9 kg.   Code status:   Code Status: Full Code   DVT Prophylaxis: heparin injection 5,000 Units Start: 08/16/22 1400 Place and maintain sequential compression device Start: 08/16/22 1110 SCDs Start: 08/10/22 1304 Placed on subcu heparin and monitor clinically along with his platelet count.   Family Communication: Daughter bedside on 08/17/2022, explained poor prognosis.   Disposition Plan: Status is: Inpatient Remains inpatient appropriate because: Severity of illness   Planned Discharge Destination:Skilled nursing facility versus home health   Diet: Diet Order             Diet regular Room service appropriate? Yes with Assist; Fluid consistency: Thin  Diet effective now                    MEDICATIONS: Scheduled Meds:  Chlorhexidine Gluconate Cloth  6 each Topical Daily   heparin injection (subcutaneous)  5,000 Units Subcutaneous Q8H   hydrocortisone sod succinate (SOLU-CORTEF) inj  50 mg Intravenous Q12H   insulin aspart  0-9 Units Subcutaneous TID WC   insulin detemir  6 Units Subcutaneous Daily   midodrine  10 mg Oral TID WC   pantoprazole  40 mg Oral Daily   Continuous Infusions:   sodium chloride 10 mL/hr at 08/15/22 0600   ampicillin-sulbactam (UNASYN) IV 3 g (08/18/22 0250)   PRN Meds:.acetaminophen, albuterol, docusate sodium, guaiFENesin-codeine, HYDROmorphone (DILAUDID) injection, melatonin, ondansetron (ZOFRAN) IV, mouth rinse, oxyCODONE-acetaminophen, polyethylene glycol   I have personally reviewed following labs and imaging studies  LABORATORY DATA:  Recent Labs  Lab 08/14/22 1110 08/15/22 0324 08/16/22 0333 08/17/22 0212 08/18/22 0340  WBC 5.7 6.3 10.4 7.8 8.3  HGB 9.4* 8.3* 8.0* 7.6* 8.0*  HCT 30.2* 25.4* 24.8* 24.5* 25.5*  PLT 38* 45* 64* 69* 84*  MCV 89.9 87.6 88.6 91.8 91.7  MCH 28.0 28.6 28.6 28.5 28.8  MCHC 31.1 32.7 32.3 31.0 31.4  RDW 23.0* 23.1* 23.5* 23.9* 24.7*  LYMPHSABS  --   --  0.7 0.4* 0.5*  MONOABS  --   --  0.2 0.1 0.1  EOSABS  --   --  0.0 0.0 0.0  BASOSABS  --   --  0.0 0.0 0.0    Recent Labs  Lab 08/14/22 1110 08/15/22 0324 08/16/22 0333 08/16/22 0618 08/16/22 1117 08/16/22 1418 08/17/22 0212 08/18/22 0340  NA 143 140 141  --   --   --  136 139  K 4.2 4.0 4.0  --   --   --  3.6 3.5  CL 107 106 108  --   --   --  104 109  CO2 '24 25 27  '$ --   --   --  27 21*  ANIONGAP '12 9 6  '$ --   --   --  5 9  GLUCOSE 170* 193* 96  --   --   --  135* 74  BUN 34* 27* 23*  --   --   --  20 17  CREATININE 0.91 0.84 0.74  --   --   --  0.70 0.64  AST  --   --  32  --   --   --  30 27  ALT  --   --  120*  --   --   --  89* 72*  ALKPHOS  --   --  165*  --   --   --  241* 246*  BILITOT  --   --  2.9*  --   --   --  2.8* 2.2*  ALBUMIN  --   --  2.0*  --   --   --  1.9* 1.9*  CRP  --   --   --  7.6*  --   --  12.2* 9.1*  PROCALCITON  --   --   --  4.81  --   --  2.67 1.62  LATICACIDVEN  --   --   --   --  1.7 1.5  --   --   TSH  --  0.577 1.071  --   --   --   --   --   BNP  --   --  821.7*  --   --   --  1,071.9* 1,428.7*  MG 1.9 1.8 1.6*  --   --   --  2.0 1.7  CALCIUM 9.0 8.5* 8.3*  --   --   --  7.9* 7.8*  Recent Labs   Lab 08/14/22 1110 08/15/22 0324 08/16/22 0333 08/16/22 0618 08/16/22 1117 08/16/22 1418 08/17/22 0212 08/18/22 0340  CRP  --   --   --  7.6*  --   --  12.2* 9.1*  PROCALCITON  --   --   --  4.81  --   --  2.67 1.62  LATICACIDVEN  --   --   --   --  1.7 1.5  --   --   TSH  --  0.577 1.071  --   --   --   --   --   BNP  --   --  821.7*  --   --   --  1,071.9* 1,428.7*  MG 1.9 1.8 1.6*  --   --   --  2.0 1.7  CALCIUM 9.0 8.5* 8.3*  --   --   --  7.9* 7.8*      RADIOLOGY STUDIES/RESULTS: ECHOCARDIOGRAM COMPLETE  Result Date: 08/16/2022    ECHOCARDIOGRAM REPORT   Patient Name:   JOSEGUADALUPE BISHARA Date of Exam: 08/16/2022 Medical Rec #:  [id number]    Height:       69.0 in Accession #:    [id number]   Weight:       169.5 lb Date of Birth:  [birth year]    BSA:          1.926 m Patient Age:    59 years     BP:           99/72 mmHg Patient Gender: M            HR:           53 bpm. Exam Location:  Inpatient Procedure: 2D Echo, Cardiac Doppler and Color Doppler Indications:    Atrial Fibrillation I48.91  History:        Patient has prior history of Echocardiogram examinations, most                 recent 12/06/2020. Arrythmias:Atrial Fibrillation; Risk                 Factors:Current Smoker.  Sonographer:    Greer Pickerel Referring Phys: [place]  Sonographer Comments: Image acquisition challenging due to patient body habitus and Image acquisition challenging due to respiratory motion. IMPRESSIONS  1. Left ventricular ejection fraction, by estimation, is 45 to 50%. The left ventricle has mildly decreased function. The left ventricle demonstrates global hypokinesis. There is mild left ventricular hypertrophy. Left ventricular diastolic parameters are consistent with Grade II diastolic dysfunction (pseudonormalization). Elevated left atrial pressure.  2. Right ventricular systolic function is normal. The right ventricular size is normal. Tricuspid regurgitation signal is inadequate for assessing PA  pressure.  3. Left atrial size was severely dilated.  4. Right atrial size was mildly dilated.  5. The mitral valve is abnormal. Mild to moderate mitral valve regurgitation. No evidence of mitral stenosis.  6. The tricuspid valve is abnormal.  7. The aortic valve has an indeterminant number of cusps. Aortic valve regurgitation is not visualized. No aortic stenosis is present.  8. The inferior vena cava is normal in size with greater than 50% respiratory variability, suggesting right atrial pressure of 3 mmHg. FINDINGS  Left Ventricle: Left ventricular ejection fraction, by estimation, is 45 to 50%. The left ventricle has mildly decreased function. The left ventricle demonstrates global hypokinesis. The left ventricular internal cavity size was normal in size. There is  mild left ventricular  hypertrophy. Left ventricular diastolic parameters are consistent with Grade II diastolic dysfunction (pseudonormalization). Elevated left atrial pressure. Right Ventricle: The right ventricular size is normal. Right vetricular wall thickness was not well visualized. Right ventricular systolic function is normal. Tricuspid regurgitation signal is inadequate for assessing PA pressure. Left Atrium: Left atrial size was severely dilated. Right Atrium: Right atrial size was mildly dilated. Pericardium: There is no evidence of pericardial effusion. Mitral Valve: The mitral valve is abnormal. Mild to moderate mitral valve regurgitation. No evidence of mitral valve stenosis. Tricuspid Valve: The tricuspid valve is abnormal. Tricuspid valve regurgitation is mild . No evidence of tricuspid stenosis. Aortic Valve: The aortic valve has an indeterminant number of cusps. Aortic valve regurgitation is not visualized. No aortic stenosis is present. Aortic valve mean gradient measures 3.5 mmHg. Aortic valve peak gradient measures 6.4 mmHg. Aortic valve area, by VTI measures 1.57 cm. Pulmonic Valve: The pulmonic valve was not well visualized.  Pulmonic valve regurgitation is not visualized. No evidence of pulmonic stenosis. Aorta: The aortic root is normal in size and structure and the ascending aorta was not well visualized. Venous: The inferior vena cava is normal in size with greater than 50% respiratory variability, suggesting right atrial pressure of 3 mmHg. IAS/Shunts: The interatrial septum was not well visualized.  LEFT VENTRICLE PLAX 2D LVIDd:         4.90 cm      Diastology LVIDs:         4.20 cm      LV e' medial:    5.11 cm/s LV PW:         1.10 cm      LV E/e' medial:  18.4 LV IVS:        1.00 cm      LV e' lateral:   8.92 cm/s LVOT diam:     1.80 cm      LV E/e' lateral: 10.5 LV SV:         42 LV SV Index:   22 LVOT Area:     2.54 cm  LV Volumes (MOD) LV vol d, MOD A2C: 136.0 ml LV vol d, MOD A4C: 134.0 ml LV vol s, MOD A2C: 77.8 ml LV vol s, MOD A4C: 75.6 ml LV SV MOD A2C:     58.2 ml LV SV MOD A4C:     134.0 ml LV SV MOD BP:      59.5 ml RIGHT VENTRICLE RV S prime:     10.60 cm/s TAPSE (M-mode): 2.0 cm LEFT ATRIUM              Index        RIGHT ATRIUM           Index LA diam:        4.70 cm  2.44 cm/m   RA Area:     20.90 cm LA Vol (A2C):   114.0 ml 59.19 ml/m  RA Volume:   61.20 ml  31.78 ml/m LA Vol (A4C):   81.8 ml  42.47 ml/m LA Biplane Vol: 104.0 ml 54.00 ml/m  AORTIC VALVE                    PULMONIC VALVE AV Area (Vmax):    1.72 cm     PR End Diast Vel: 5.76 msec AV Area (Vmean):   1.47 cm AV Area (VTI):     1.57 cm AV Vmax:           126.55  cm/s AV Vmean:          89.453 cm/s AV VTI:            0.271 m AV Peak Grad:      6.4 mmHg AV Mean Grad:      3.5 mmHg LVOT Vmax:         85.40 cm/s LVOT Vmean:        51.700 cm/s LVOT VTI:          0.167 m LVOT/AV VTI ratio: 0.62  AORTA Ao Root diam: 3.10 cm MITRAL VALVE MV Area (PHT): 4.68 cm    SHUNTS MV Decel Time: 162 msec    Systemic VTI:  0.17 m MR Peak grad: 28.7 mmHg    Systemic Diam: 1.80 cm MR Vmax:      268.00 cm/s MV E velocity: 94.00 cm/s MV A velocity: 25.00 cm/s MV  E/A ratio:  3.76 Carlyle Dolly MD Electronically signed by Carlyle Dolly MD Signature Date/Time: 08/16/2022/11:32:51 AM    Final      LOS: 8 days   Signature  -    Lala Lund M.D on 08/18/2022 at 9:18 AM   -  To page go to [email or web address]

## 2022-08-18 NOTE — TOC Transition Note (Signed)
Transition of Care [facility]) - CM/SW Discharge Note   Patient Details  Name: James Coffey MRN: [id number] Date of Birth: Dec 31, 1970  Transition of [facility]) CM/SW Contact:  Levonne Lapping, RN Phone Number: 08/18/2022, 3:05 PM   Clinical Narrative:     DME has been ordered through [facility].  BSC/ wheelchair and shower chair     Final next level of care: [place] Barriers to Discharge: Continued Medical Work up   Patient Goals and CMS Choice CMS Medicare.gov Compare Post Acute Care list provided to:: Patient Choice offered to / list presented to : Patient, Spouse, Adult Children (Daughter, Engineer, technical sales)  Discharge Placement                         Discharge Plan and Services Additional resources added to the After Visit Summary for     Discharge Planning Services: CM Consult Post Acute Care Choice: Home Health          DME Arranged: N/A DME Agency: NA       HH Arranged: PT HH Agency: [place] Date [place]: 08/13/22 Time [place]: [id number] Representative spoke with at [place]: [place] Determinants of Health ([place]) Interventions SDOH Screenings   Food Insecurity: No Food Insecurity (07/23/2022)  Housing: Low Risk  (07/23/2022)  Transportation Needs: No Transportation Needs (07/23/2022)  Utilities: Not At Risk (07/23/2022)  Depression (PHQ2-9): Medium Risk (01/31/2021)  Tobacco Use: High Risk (08/10/2022)     Readmission Risk Interventions    03/22/2022   12:38 PM  Readmission Risk Prevention Plan  Transportation Screening Complete  PCP or Specialist Appt within 5-7 Days Complete  Home Care Screening Complete  Medication Review (RN CM) Complete

## 2022-08-18 NOTE — Progress Notes (Signed)
Brief Palliative Medicine Progress Note:  PMT following peripherally for needs/decline. Patient has very clearly insisted that he no longer wants PMT to be involved in his care.   Medical records reviewed including progress notes, labs, imaging. Possible discharge pending 08/19/22. Outpatient Palliative Care was arranged with [facility] today; however, patient was already scheduled to be followed with outpatient Palliative Care at [facility] as indicated in previous PMT notes. TOC and Dr. Candiss Norse notified that he is now being followed by two services.  Goals of care were clear for full code/full scope treatment.    PMT will continue to follow peripherally. If there are any imminent needs please call the service directly. Family also has PMT contact information should further needs arise.  Thank you for allowing PMT to assist in the care of this patient.  Genita Nilsson M. Tamala Julian [facility] Palliative Medicine Team Team Phone: [phone] NO CHARGE

## 2022-08-18 NOTE — TOC Progression Note (Signed)
Transition of Care [facility]) - Progression Note    Patient Details  Name: James Coffey MRN: [id number] Date of Birth: Jan 04, 1971  Transition of Care [facility]) CM/SW [place], LCSW Phone Number: 08/18/2022, 9:31 AM  Clinical Narrative:    CSW received request from MD to speak with patient's daughter, [name]. She reported understanding and agreement with CSW arranging outpatient palliative. CSW requested [facility] Palliative evaluate patient to see if he qualifies for their program in accordance with daughter's request. She requests she be the contact [phone]).   CSW went over home health services that have been set up with [facility]. She requested interpreter be used with patient when home health is there and family is not available [place] or [place]). She requests all recommended DME be delivered to patient's apartment (3in1, Wheelchair, and shower stool). Confirmed address on Facesheet.   Update: Patient already being followed by OP Palliative at [facility] center. Reached out to [place] with [facility] to cancel request for [facility] and was told that patient can have both programs follow him for extra support. CSW placed info on AVS.   Expected Discharge Plan: [place] Barriers to Discharge: Continued Medical Work up  Expected Discharge Plan and Services   Discharge Planning Services: CM Consult Post Acute Care Choice: [place] arrangements for the past 2 months: Apartment                 DME Arranged: N/A DME Agency: NA       HH Arranged: PT HH Agency: [place] Date [place]: 08/13/22 Time [place]: [id number] Representative spoke with at [place]: [place] Determinants of Health ([place]) Interventions SDOH Screenings   Food Insecurity: No Food Insecurity (07/23/2022)  Housing: Low Risk  (07/23/2022)  Transportation Needs: No Transportation Needs (07/23/2022)  Utilities: Not At Risk (07/23/2022)   Depression (PHQ2-9): Medium Risk (01/31/2021)  Tobacco Use: High Risk (08/10/2022)    Readmission Risk Interventions    03/22/2022   12:38 PM  Readmission Risk Prevention Plan  Transportation Screening Complete  PCP or Specialist Appt within 5-7 Days Complete  Home Care Screening Complete  Medication Review (RN CM) Complete

## 2022-08-18 NOTE — Progress Notes (Addendum)
[place] [facility]) Hospital Liaison Note  Notified by Arta Silence, LCSW of patient/family request for [facility] Palliative services at home after discharge.  [facility] hospital liaison will follow patient for discharge disposition.  Please call with any hospice or outpatient care related questions.  Thank you for the opportunity to participate in this patients care.  Margaretmary Eddy, BSN, RN [facility] Liaison [phone]

## 2022-08-19 ENCOUNTER — Other Ambulatory Visit: Payer: Self-pay

## 2022-08-19 ENCOUNTER — Other Ambulatory Visit (HOSPITAL_COMMUNITY): Payer: Self-pay

## 2022-08-19 DIAGNOSIS — C7931 Secondary malignant neoplasm of brain: Secondary | ICD-10-CM

## 2022-08-19 DIAGNOSIS — Z515 Encounter for palliative care: Secondary | ICD-10-CM

## 2022-08-19 LAB — GLUCOSE, CAPILLARY: Glucose-Capillary: 86 mg/dL (ref 70–99)

## 2022-08-19 MED ORDER — LEVETIRACETAM 500 MG PO TABS
500.0000 mg | ORAL_TABLET | Freq: Two times a day (BID) | ORAL | 0 refills | Status: DC
  Filled 2022-08-19: qty 60, 30d supply, fill #0

## 2022-08-19 MED ORDER — HEPARIN SOD (PORK) LOCK FLUSH 100 UNIT/ML IV SOLN: 500.0000 [IU] | INTRAVENOUS | Status: DC | PRN

## 2022-08-19 MED ORDER — HEPARIN SOD (PORK) LOCK FLUSH 100 UNIT/ML IV SOLN
500.0000 [IU] | INTRAVENOUS | Status: AC | PRN
  Administered 2022-08-19: 500 [IU]

## 2022-08-19 MED ORDER — OXYCODONE HCL 5 MG PO TABS
5.0000 mg | ORAL_TABLET | Freq: Three times a day (TID) | ORAL | 0 refills | Status: DC | PRN
  Filled 2022-08-19: qty 20, 7d supply, fill #0

## 2022-08-19 MED ORDER — DEXAMETHASONE 2 MG PO TABS
2.0000 mg | ORAL_TABLET | Freq: Every day | ORAL | 0 refills | Status: DC
  Filled 2022-08-19: qty 30, 30d supply, fill #0

## 2022-08-19 MED ORDER — HYDROMORPHONE HCL 1 MG/ML IJ SOLN
1.0000 mg | Freq: Once | INTRAMUSCULAR | Status: AC
  Administered 2022-08-19: 1 mg via INTRAVENOUS
  Filled 2022-08-19: qty 1

## 2022-08-19 MED ORDER — LEVETIRACETAM 500 MG PO TABS
500.0000 mg | ORAL_TABLET | Freq: Two times a day (BID) | ORAL | Status: DC
  Administered 2022-08-19: 500 mg via ORAL
  Filled 2022-08-19: qty 1

## 2022-08-19 MED ORDER — MIDODRINE HCL 10 MG PO TABS
10.0000 mg | ORAL_TABLET | Freq: Three times a day (TID) | ORAL | 0 refills | Status: DC
  Filled 2022-08-19: qty 90, 30d supply, fill #0

## 2022-08-19 MED ORDER — AMOXICILLIN-POT CLAVULANATE 875-125 MG PO TABS
1.0000 | ORAL_TABLET | Freq: Two times a day (BID) | ORAL | 0 refills | Status: DC
  Filled 2022-08-19: qty 20, 10d supply, fill #0

## 2022-08-19 MED ORDER — HEPARIN SOD (PORK) LOCK FLUSH 100 UNIT/ML IV SOLN
500.0000 [IU] | Freq: Once | INTRAVENOUS | Status: DC
  Filled 2022-08-19: qty 5

## 2022-08-19 NOTE — Plan of Care (Signed)
  Problem: Education: Goal: Knowledge of the prescribed therapeutic regimen will improve Outcome: Progressing   Problem: Clinical Measurements: Goal: Usual level of consciousness will be regained or maintained. Outcome: Progressing Goal: Neurologic status will improve Outcome: Progressing Goal: Ability to maintain intracranial pressure will improve Outcome: Progressing   Problem: Skin Integrity: Goal: Demonstration of wound healing without infection will improve Outcome: Progressing   Problem: Education: Goal: Knowledge of General Education information will improve Description: Including pain rating scale, medication(s)/side effects and non-pharmacologic comfort measures Outcome: Progressing   Problem: Health Behavior/Discharge Planning: Goal: Ability to manage health-related needs will improve Outcome: Progressing   Problem: Clinical Measurements: Goal: Ability to maintain clinical measurements within normal limits will improve Outcome: Progressing Goal: Will remain free from infection Outcome: Progressing Goal: Diagnostic test results will improve Outcome: Progressing Goal: Respiratory complications will improve Outcome: Progressing Goal: Cardiovascular complication will be avoided Outcome: Progressing   Problem: Activity: Goal: Risk for activity intolerance will decrease Outcome: Progressing   Problem: Nutrition: Goal: Adequate nutrition will be maintained Outcome: Progressing   Problem: Coping: Goal: Level of anxiety will decrease Outcome: Progressing   Problem: Elimination: Goal: Will not experience complications related to bowel motility Outcome: Progressing Goal: Will not experience complications related to urinary retention Outcome: Progressing   Problem: Pain Managment: Goal: General experience of comfort will improve Outcome: Progressing   Problem: Safety: Goal: Ability to remain free from injury will improve Outcome: Progressing   Problem:  Skin Integrity: Goal: Risk for impaired skin integrity will decrease Outcome: Progressing   Problem: Education: Goal: Ability to describe self-care measures that may prevent or decrease complications (Diabetes Survival Skills Education) will improve Outcome: Progressing Goal: Individualized Educational Video(s) Outcome: Progressing   Problem: Coping: Goal: Ability to adjust to condition or change in health will improve Outcome: Progressing   Problem: Fluid Volume: Goal: Ability to maintain a balanced intake and output will improve Outcome: Progressing   Problem: Health Behavior/Discharge Planning: Goal: Ability to identify and utilize available resources and services will improve Outcome: Progressing Goal: Ability to manage health-related needs will improve Outcome: Progressing   Problem: Metabolic: Goal: Ability to maintain appropriate glucose levels will improve Outcome: Progressing   Problem: Nutritional: Goal: Maintenance of adequate nutrition will improve Outcome: Progressing Goal: Progress toward achieving an optimal weight will improve Outcome: Progressing   Problem: Skin Integrity: Goal: Risk for impaired skin integrity will decrease Outcome: Progressing   Problem: Tissue Perfusion: Goal: Adequacy of tissue perfusion will improve Outcome: Progressing   

## 2022-08-19 NOTE — Progress Notes (Signed)
COMMUNITY PALLIATIVE CARE SW NOTE  PATIENT NAMEDavarian Lohse DOB: 02/22/1971 MRN: [id number]  PRIMARY CARE PROVIDER: Patient, No Pcp Per  RESPONSIBLE PARTY:  Acct ID - Guarantor Home Phone Work Phone Relationship Acct Type  [id number] [name] [phone]  Self P/F     [place], [place], [place] [address]   Initial Palliative Care Encounter/Clinical Social Work  [facility] SW completed an initial telephonic encounter with patient's daughter. SW provided education to her regarding the palliative care program, services and visit frequency. SW also provided contact information. The daughter was open to palliative care services and gave a verbal consent to services.  She provided a brief status update on patient. She advised that patient just got in from the hospital. He is using a wheelchair to ambulate. He requires assistance with personal care needs. He has orders to receive physical therapy through [facility], but is waiting to receive a call from them to get it set up.  Patient does not have advance directives. She is not interested in completing at this time. Her desire is to get patient adjusted to being back at home and get all his services set up and in place. SW scheduled a follow-up visit for 09/11/22 @ 9am.    Social History   Tobacco Use   Smoking status: Every Day    Packs/day: 0.25    Types: Cigarettes   Smokeless tobacco: Never  Substance Use Topics   Alcohol use: No    CODE STATUS: Full Code ADVANCED DIRECTIVES: No MOST FORM COMPLETE: No HOSPICE EDUCATION PROVIDED: No  Duration of encounter and documentation: 30 minutes.  [address] [place], [place]

## 2022-08-19 NOTE — Discharge Instructions (Signed)
Follow with Primary MD & your oncologist Dr. Irene Limbo in 7 days   Get CBC, CMP, Magnesium, 2 view Chest X ray -  checked next visit with your primary MD   Activity: As tolerated with Full fall precautions use walker/cane & assistance as needed  Disposition Home    Diet: Soft diet with feeding assistance and aspiration precautions.  Special Instructions: If you have smoked or chewed Tobacco  in the last 2 yrs please stop smoking, stop any regular Alcohol  and or any Recreational drug use.  On your next visit with your primary care physician please Get Medicines reviewed and adjusted.  Please request your Prim.MD to go over all Hospital Tests and Procedure/Radiological results at the follow up, please get all Hospital records sent to your Prim MD by signing hospital release before you go home.  If you experience worsening of your admission symptoms, develop shortness of breath, life threatening emergency, suicidal or homicidal thoughts you must seek medical attention immediately by calling 911 or calling your MD immediately  if symptoms less severe.  You Must read complete instructions/literature along with all the possible adverse reactions/side effects for all the Medicines you take and that have been prescribed to you. Take any new Medicines after you have completely understood and accpet all the possible adverse reactions/side effects.

## 2022-08-19 NOTE — Progress Notes (Signed)
Physical Therapy Treatment Patient Details Name: James Coffey MRN: [id number] DOB: [birth year] Today's Date: 08/19/2022   History of Present Illness 52 y.o. male who presents on 08/10/22 with fever x several days. Was admitted with septic shock, acute respiratory failure, AKI, encephalopathy. PMH includes rectal cancer with mets to liver (L lateral hepatectomy 08/28/21), lungs, and brain (L frontal craniotomy for tumor resection on 08/05/22), fournier gangrene, perianal abscess, loop colostomy.    PT Comments    Pt tolerated today's session well but limited by fatigue after transfer to chair. Pt with wheelchair in his room, pt declining use today but educated on all parts and for use with mobility, especially for increased distances. Pt progressing to minA for bed mobility and transfer, ambulating with RW briefly to chair with verbal cues for proper technique. Acute PT will continue to follow up with pt to progress mobility and activity tolerance, discharge recommendations remain appropriate.     Recommendations for follow up therapy are one component of a multi-disciplinary discharge planning process, led by the attending physician.  Recommendations may be updated based on patient status, additional functional criteria and insurance authorization.  Follow Up Recommendations  Home health PT     Assistance Recommended at Discharge Intermittent Supervision/Assistance  Patient can return home with the following A little help with walking and/or transfers;A little help with bathing/dressing/bathroom;Assistance with cooking/housework;Help with stairs or ramp for entrance;Assist for transportation   Equipment Recommendations  BSC/3in1;Wheelchair (measurements PT);Wheelchair cushion (measurements PT);Other (comment)    Recommendations for Other Services       Precautions / Restrictions Precautions Precautions: Fall Precaution Comments: colostomy, recent crani, going home with  foley Restrictions Weight Bearing Restrictions: No Other Position/Activity Restrictions: colostomy, recent crani, going home with foley     Mobility  Bed Mobility Overal bed mobility: Needs Assistance Bed Mobility: Supine to Sit     Supine to sit: Min assist, HOB elevated     General bed mobility comments: use of bed rail, minA for trunk support    Transfers Overall transfer level: Needs assistance Equipment used: Rolling walker (2 wheels) Transfers: Sit to/from Stand Sit to Stand: Min assist           General transfer comment: minA to power up, cued for proper hand placement    Ambulation/Gait Ambulation/Gait assistance: Min guard Gait Distance (Feet): 4 Feet Assistive device: Rolling walker (2 wheels) Gait Pattern/deviations: Step-to pattern, Decreased stride length, Knee flexed in stance - right, Knee flexed in stance - left, Trunk flexed Gait velocity: decreased     General Gait Details: minG for safety and line management. Pt with standing rest break after sit>stand, cued for upright posture and turning to chair   Stairs             Wheelchair Mobility    Modified Rankin (Stroke Patients Only)       Balance Overall balance assessment: Needs assistance Sitting-balance support: Bilateral upper extremity supported, Feet supported Sitting balance-Leahy Scale: Fair     Standing balance support: Bilateral upper extremity supported, During functional activity Standing balance-Leahy Scale: Poor Standing balance comment: reliant on RW for balance                            Cognition Arousal/Alertness: Awake/alert Behavior During Therapy: Flat affect Overall Cognitive Status: Impaired/Different from baseline Area of Impairment: Safety/judgement, Problem solving  Safety/Judgement: Decreased awareness of safety, Decreased awareness of deficits   Problem Solving: Slow processing, Requires verbal  cues General Comments: pt instructed on safe use of wheelchair as well as wheelchair parts, pt declining use today but verbalizes understanding. Pt declining use of interpreter today        Exercises      General Comments General comments (skin integrity, edema, etc.): VSS on room air      Pertinent Vitals/Pain Pain Assessment Pain Assessment: Faces Faces Pain Scale: Hurts little more Pain Location: generalized Pain Descriptors / Indicators: Grimacing, Guarding, Discomfort Pain Intervention(s): Limited activity within patient's tolerance, Monitored during session, Repositioned    Home Living                          Prior Function            PT Goals (current goals can now be found in the care plan section) Acute Rehab PT Goals Patient Stated Goal: to go home PT Goal Formulation: With patient Time For Goal Achievement: 08/25/22 Potential to Achieve Goals: Good Progress towards PT goals: Progressing toward goals    Frequency    Min 3X/week      PT Plan Current plan remains appropriate    Co-evaluation              AM-PAC PT "6 Clicks" Mobility   Outcome Measure  Help needed turning from your back to your side while in a flat bed without using bedrails?: A Little Help needed moving from lying on your back to sitting on the side of a flat bed without using bedrails?: A Little Help needed moving to and from a bed to a chair (including a wheelchair)?: A Little Help needed standing up from a chair using your arms (e.g., wheelchair or bedside chair)?: A Little Help needed to walk in hospital room?: A Little Help needed climbing 3-5 steps with a railing? : Total 6 Click Score: 16    End of Session Equipment Utilized During Treatment: Gait belt Activity Tolerance: Patient tolerated treatment well Patient left: in chair;with call bell/phone within reach;with chair alarm set Nurse Communication: Mobility status PT Visit Diagnosis: Muscle weakness  (generalized) (M62.81);Difficulty in walking, not elsewhere classified (R26.2);Unsteadiness on feet (R26.81)     Time: [id number] PT Time Calculation (min) (ACUTE ONLY): 14 min  Charges:  $Therapeutic Activity: 8-22 mins                     Charlynne Cousins, PT DPT Acute Rehabilitation Services Office [phone]    Luvenia Heller 08/19/2022, 3:40 PM

## 2022-08-19 NOTE — Discharge Summary (Addendum)
James Coffey [id number] DOB: 07-Feb-1971 DOA: 08/10/2022  PCP: Patient, No Pcp Per  Admit date: 08/10/2022  Discharge date: 08/19/2022  Admitted From: Home   Disposition:  Home with Palliative Care   Recommendations for Outpatient Follow-up:   Follow up with PCP in 1-2 weeks  PCP Please obtain BMP/CBC, 2 view CXR in 1week,  (see Discharge instructions)   PCP Please follow up on the following pending results: Patient has advanced metastatic rectal cancer with extremely poor prognosis, is being discharged with palliative care services, if there is continued clinical decline in the outpatient setting consider hospice.   Home Health: [facility]   Equipment/Devices: see below  Consultations: Onc over phone, Pall Care, PCCM, ID, N.Surgery, Urology Discharge Condition: Guarded CODE STATUS: Full   Diet Recommendation: Soft diet with feeding assistance and aspiration precautions.     Chief Complaint  Patient presents with   Fever     Brief history of present illness from the day of admission and additional interim summary    52 y.o.  male history of metastatic colon cancer-s/p colectomy-s/p recent craniotomy with tumor resection 2/28-who presented with septic shock in the setting of emphysematous cystitis/pyelonephritis and E. coli bacteremia.   Significant events: 2/28>> craniotomy 3/01>> discharged from [facility] after craniotomy 3/05>> admit to ICU by PCCM with septic shock/AKI 3/08>> off pressors, no vesicle intestinal fistula on CT cystogram. 3/09>> transfer to [facility]   Significant studies: 3/04>> CT head: Postoperative changes in the left frontal craniotomy 3/04>> CT chest/abdomen/pelvis: Gas in the urinary bladder wall-concerning for emphysematous cystitis, mild bilateral hydroureteral nephrosis at the level of the  urinary bladder, similar appearance of the fluid/gas collection in the presacral/mesorectal space.  New patchy bilateral groundglass opacities. 3/07>> CT cystogram abdomen/pelvis: No evidence of vesicle intestinal fistula, 4.2 x 6.4 cm and necrotic presacral mass-at the site of prior fluid collection-s/p drainage-favoring residual/recurrent rectal tumor rather than a fluid collection.  Progressive hepatic metastases.   Significant microbiology data: 3/04>> COVID/influenza/RSV PCR: Negative 3/04>> blood cultures: E. Coli 3/04>> urine culture: E. coli, Enterococcus 3/05>> blood cultures: No growth   Procedures:   TTE - 1. Left ventricular ejection fraction, by estimation, is 45 to 50%. The left ventricle has mildly decreased function. The left ventricle demonstrates global hypokinesis. There is mild left ventricular hypertrophy. Left ventricular diastolic parameters are consistent with Grade II diastolic dysfunction (pseudonormalization). Elevated left atrial pressure.  2. Right ventricular systolic function is normal. The right ventricular size is normal. Tricuspid regurgitation signal is inadequate for assessing PA pressure.  3. Left atrial size was severely dilated.  4. Right atrial size was mildly dilated.  5. The mitral valve is abnormal. Mild to moderate mitral valve regurgitation. No evidence of mitral stenosis.  6. The tricuspid valve is abnormal.  7. The aortic valve has an indeterminant number of cusps. Aortic valve regurgitation is not visualized. No aortic stenosis is present.  8. The inferior vena cava is normal in size with greater than 50% respiratory variability, suggesting right atrial pressure of 3 mmHg  Hospital Course   Septic shock due to E. coli bacteremia and E. coli emphysematous cystitis Sepsis pathophysiology had initially improved, hypotensive again morning of 08/16/2022, 2 L of LR, midodrine, stress dose steroids,  cortisol despite being hypotensive was 1.5, with midodrine and steroids blood pressure has improved, has been kept on Unasyn and will be transition to Augmentin for 10 more days to complete antibiotics on 08/27/2022.  Blood pressure has improved, will be discharged home on midodrine along with low-dose oral Decadron.  PCP to monitor and adjust.    Acute hypoxic respiratory failure likely due to PNA Improving-on minimal amount of oxygen Continue pulmonary toileting On IV Unasyn   A-fib RVR this odes of bradycardia on night of 08/15/2022 vas 2 score of greater than 3. Rate controlled was on amiodarone stopped on 08/15/2022 night due to episodes of bradycardia Not a candidate for anticoagulation currently given severe thrombocytopenia, recent craniotomy with surgery and now transitioning to home palliative care. TSH stable Echo noted     AKI Hemodynamically mediated in the setting of septic shock Resolved with supportive care Foley catheter remains in place-Per urology consult note-leave Foley catheter in place-until repeat imaging shows resolution of presenting issues, requested to follow-up with urology postdischarge.   Transaminitis Probably shock liver Check transaminases with a.m. labs   Thrombocytopenia Secondary to septic shock Improving Follow CBC   Normocytic anemia Due to critical illness Follow CBC Transfuse if Hb <7   Presacral fluid fluid collection General surgery has no plans for drainage/biopsy-as this is felt to be a residual collection   Recent left frontal craniotomy with tumor resection-2/28 (Dr. [name]) Stable.  For now steroids is being used for hypotension and relative adrenal insufficiency upon discharge, continue Keppra for 1 more month upon discharge.  Thereafter per neurosurgery.  He received 1 month supply on 08/07/2022 by Dr. [name] during his previous discharge.  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 Harlow Asa S/p neoadjuvant FOLFOX chemotherapy 08/28/2021- open left hepatectomy Dr. [name] 10/2021- ablation of a right liver metastasis  S/p chemotherapy and radiation therapy of rectal mass S/p radiation therapy of lung lesions 02/04/2022 - robotic APR, parastomal hernia repair Dr. [name]  S/p radiation therapy of lung lesions 10-04/2022 IR drainage of pre-sacral collections, injection studies not significant for fistula to bladder at that time. 07/31/2022 S/p single dose of radiation therapy to the brain 08/05/2022 - left frontal craniotomy for tumor resection, Dr. [name]  Patient currently being considered for Y90 treatments of liver metastasis by IR Dr. Kathlene Cote, case discussed with Dr. Pascal Lux IR on 08/19/2022. Follows w Dr. Irene Limbo, case discussed with him on 08/17/2022 he expresses that patient has very poor prognosis and unfortunately has poor insight into his condition as well, discussed care plan with patient's daughters on 08/17/2022 & 08/18/2022 understand the poor prognosis and her open for palliative care follow-up at home, patient at this point as per discussions with his daughter and him will be discharged home with palliative care services on 08/19/2022.  Again long-term prognosis is extremely poor.    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 Irene Limbo in 7 days   Get CBC, CMP, Magnesium, 2 view Chest X ray -  checked next visit with your primary MD   Activity: As tolerated with Full fall precautions use walker/cane &  assistance as needed  Disposition Home    Diet: Soft diet with feeding assistance and aspiration precautions.  Special Instructions: If you have smoked or chewed Tobacco  in the last 2 yrs please stop smoking, stop any regular Alcohol  and or any Recreational drug use.  On your next visit with your primary care physician please Get Medicines reviewed and adjusted.  Please request your Prim.MD to go over all Hospital Tests and Procedure/Radiological results at the follow up, please get all Hospital records sent to your Prim MD by signing hospital release before you go home.  If you experience worsening of your admission symptoms, develop shortness of breath, life threatening emergency, suicidal or homicidal thoughts you must seek medical attention immediately by calling 911 or calling your MD immediately  if symptoms less severe.  You Must read complete instructions/literature along with all the possible adverse reactions/side effects for all the Medicines you take and that have been prescribed to you. Take any new Medicines after you have completely understood and accpet all the possible adverse reactions/side effects.   Increase activity slowly   Complete by: As directed        Discharge Medications   Allergies as of 08/19/2022       Reactions   Hydrocodone Itching   Pork-derived Products Other (See Comments)   Do not eat pork, Patient is ok with receiving heparin products        Medication List     STOP taking these medications    acetaminophen-codeine 300-30 MG tablet Commonly known as: TYLENOL #3   senna-docusate 8.6-50 MG tablet Commonly known as: Senna S       TAKE these medications    acetaminophen 500 MG tablet Commonly known as: TYLENOL Take 2 tablets (1,000 mg total) by mouth every 8 (eight) hours as needed for mild pain or fever.   amoxicillin-clavulanate 875-125 MG tablet Commonly known as: AUGMENTIN Take 1 tablet by mouth 2 (two) times daily.    cyanocobalamin 1000 MCG tablet Commonly known as: VITAMIN B12 Take 1 tablet (1,000 mcg total) by mouth daily.   dexamethasone 2 MG tablet Commonly known as: DECADRON Take 1 tablet (2 mg total) by mouth daily. What changed:  medication strength how much to take when to take this   levETIRAcetam 500 MG tablet Commonly known as: KEPPRA Take 1 tablet (500 mg total) by mouth 2 (two) times daily.   LORazepam 0.5 MG tablet Commonly known as: ATIVAN Take 1 tablet (0.5 mg total) by mouth at bedtime. What changed:  when to take this reasons to take this   midodrine 10 MG tablet Commonly known as: PROAMATINE Take 1 tablet (10 mg total) by mouth 3 (three) times daily with meals.   oxyCODONE 5 MG immediate release tablet Commonly known as: Oxy IR/ROXICODONE Take 1 tablet (5 mg total) by mouth 3 (three) times daily as needed for moderate pain or severe pain.   pantoprazole 40 MG tablet Commonly known as: Protonix Take 1 tablet (40 mg total) by mouth daily.   polyethylene glycol 17 g packet Commonly known as: MiraLax Take 17 g by mouth daily. Take daily to maintain regular soft colostomy  output. Can increase to twice a day if need. What changed:  when to take this reasons to take this additional instructions   prochlorperazine 10 MG tablet Commonly known as: COMPAZINE Take 1 tablet (10 mg total) by mouth every 6 (six) hours as needed for nausea or vomiting. What changed: when to take this   tamsulosin 0.4 MG Caps capsule Commonly known as: Flomax Take 1 capsule (0.4 mg total) by mouth daily after supper.               Durable Medical Equipment  (From admission, onward)           Start     Ordered   08/18/22 1008  For home use only DME 3 n 1  Once        08/18/22 1007   08/18/22 1008  For home use only DME Shower stool  Once        08/18/22 1007   08/18/22 1007  For home use only DME lightweight manual wheelchair with seat cushion  Once       Comments:  Patient suffers from advanced metastatic cancer with severe deconditioning which impairs their ability to perform daily activities like bathing, dressing, feeding, grooming, and toileting in the home.  A cane, crutch, or walker will not resolve  issue with performing activities of daily living. A wheelchair will allow patient to safely perform daily activities. Patient is not able to propel themselves in the home using a standard weight wheelchair due to arm weakness, endurance, and general weakness. Patient can self propel in the lightweight wheelchair. Length of need 12 months . Accessories: elevating leg rests (ELRs), wheel locks, extensions and anti-tippers.   08/18/22 1007             Follow-up Information     [name], MD. Go on [id number].   Specialties: General Surgery, Colon and Rectal Surgery Why: 930am. Please arrive 30 minutes prior to your appointment for paperwork. Bring a copy of your photo ID & insurance card Contact information: [place] [phone]         Care, [facility] Follow up.   Specialty: Home Health Services Why: Someone will call you to schedule first home visit. If you have not received a call after two days of discharging home, call their number listed. If no one comes to assess, call Case Manager at [phone]. Contact information: [name] [address] [phone]         outpatient Palliative Care at [facility] Follow up.   Why: She will call you to follow up. Contact information: Alda Lea, NP        [facility] Follow up.   Why: Palliative care agency following for extra support in addition to the [facility] cancer center team. Contact information: [place] [phone]        [name], MD Follow up.   Specialties: Hematology, Oncology Contact information: [place] [address] [id number]         Pamala Hurry, MD. Schedule an appointment as soon as possible for a visit in 1 week(s).   Specialty: Urology Contact information: [place] [address] [phone]                 Major procedures and Radiology Reports - PLEASE review detailed and final reports thoroughly  -  ECHOCARDIOGRAM COMPLETE  Result Date: 08/16/2022    ECHOCARDIOGRAM REPORT   Patient Name:   James Coffey Date of Exam: 08/16/2022 Medical Rec #:  [id number]    Height:       69.0 in Accession #:    [id number]   Weight:       169.5 lb Date of Birth:  14-Mar-1971    BSA:          1.926 m Patient Age:    44 years     BP:           99/72 mmHg Patient Gender: M            HR:           53 bpm. Exam Location:  Inpatient Procedure: 2D Echo, Cardiac Doppler and Color Doppler Indications:    Atrial Fibrillation I48.91  History:        Patient has prior history of Echocardiogram examinations, most                 recent 12/06/2020. Arrythmias:Atrial Fibrillation; Risk                 Factors:Current Smoker.  Sonographer:    Greer Pickerel Referring Phys: [place]  Sonographer Comments: Image acquisition challenging due to patient body habitus and Image acquisition challenging due to respiratory motion. IMPRESSIONS  1. Left ventricular ejection fraction, by estimation, is 45 to 50%. The left ventricle has mildly decreased function. The left ventricle demonstrates global hypokinesis. There is mild left ventricular hypertrophy. Left ventricular diastolic parameters are consistent with Grade II diastolic dysfunction (pseudonormalization). Elevated left atrial pressure.  2. Right ventricular systolic function is normal. The right ventricular size is normal. Tricuspid regurgitation signal is inadequate for assessing PA pressure.  3. Left atrial size was severely dilated.  4. Right atrial size was mildly dilated.  5. The mitral valve is  abnormal. Mild to moderate mitral valve regurgitation. No evidence of mitral stenosis.  6. The tricuspid valve is abnormal.  7. The aortic valve has an indeterminant number of cusps. Aortic valve regurgitation is not visualized. No aortic stenosis is present.  8. The inferior vena cava is normal in size with greater than 50% respiratory variability, suggesting right atrial pressure of 3 mmHg. FINDINGS  Left Ventricle: Left ventricular ejection fraction, by estimation, is 45 to 50%. The left ventricle has mildly decreased function. The left ventricle demonstrates global hypokinesis. The left ventricular internal cavity size was normal in size. There is  mild left ventricular hypertrophy. Left ventricular diastolic parameters are consistent with Grade II diastolic dysfunction (pseudonormalization). Elevated left atrial pressure. Right Ventricle: The right ventricular size is normal. Right vetricular wall thickness was not well visualized. Right ventricular systolic function is normal. Tricuspid regurgitation signal is inadequate for assessing PA pressure. Left Atrium: Left atrial size was severely dilated. Right Atrium: Right atrial size was mildly dilated. Pericardium: There is no evidence of pericardial effusion. Mitral Valve: The mitral valve is abnormal. Mild to moderate mitral valve regurgitation. No evidence of mitral valve stenosis. Tricuspid Valve: The tricuspid valve is abnormal. Tricuspid valve regurgitation is mild . No evidence of tricuspid stenosis. Aortic Valve: The aortic valve has an indeterminant number of cusps. Aortic valve regurgitation is not visualized. No aortic stenosis is present. Aortic valve mean gradient measures 3.5 mmHg. Aortic valve peak gradient measures 6.4 mmHg. Aortic valve area, by VTI measures 1.57 cm. Pulmonic Valve: The pulmonic valve was not well visualized. Pulmonic valve  regurgitation is not visualized. No evidence of pulmonic stenosis. Aorta: The aortic root is normal in  size and structure and the ascending aorta was not well visualized. Venous: The inferior vena cava is normal in size with greater than 50% respiratory variability, suggesting right atrial pressure of 3 mmHg. IAS/Shunts: The interatrial septum was not well visualized.  LEFT VENTRICLE PLAX 2D LVIDd:         4.90 cm      Diastology LVIDs:         4.20 cm      LV e' medial:    5.11 cm/s LV PW:         1.10 cm      LV E/e' medial:  18.4 LV IVS:        1.00 cm      LV e' lateral:   8.92 cm/s LVOT diam:     1.80 cm      LV E/e' lateral: 10.5 LV SV:         42 LV SV Index:   22 LVOT Area:     2.54 cm  LV Volumes (MOD) LV vol d, MOD A2C: 136.0 ml LV vol d, MOD A4C: 134.0 ml LV vol s, MOD A2C: 77.8 ml LV vol s, MOD A4C: 75.6 ml LV SV MOD A2C:     58.2 ml LV SV MOD A4C:     134.0 ml LV SV MOD BP:      59.5 ml RIGHT VENTRICLE RV S prime:     10.60 cm/s TAPSE (M-mode): 2.0 cm LEFT ATRIUM              Index        RIGHT ATRIUM           Index LA diam:        4.70 cm  2.44 cm/m   RA Area:     20.90 cm LA Vol (A2C):   114.0 ml 59.19 ml/m  RA Volume:   61.20 ml  31.78 ml/m LA Vol (A4C):   81.8 ml  42.47 ml/m LA Biplane Vol: 104.0 ml 54.00 ml/m  AORTIC VALVE                    PULMONIC VALVE AV Area (Vmax):    1.72 cm     PR End Diast Vel: 5.76 msec AV Area (Vmean):   1.47 cm AV Area (VTI):     1.57 cm AV Vmax:           126.55 cm/s AV Vmean:          89.453 cm/s AV VTI:            0.271 m AV Peak Grad:      6.4 mmHg AV Mean Grad:      3.5 mmHg LVOT Vmax:         85.40 cm/s LVOT Vmean:        51.700 cm/s LVOT VTI:          0.167 m LVOT/AV VTI ratio: 0.62  AORTA Ao Root diam: 3.10 cm MITRAL VALVE MV Area (PHT): 4.68 cm    SHUNTS MV Decel Time: 162 msec    Systemic VTI:  0.17 m MR Peak grad: 28.7 mmHg    Systemic Diam: 1.80 cm MR Vmax:      268.00 cm/s MV E velocity: 94.00 cm/s MV A velocity: 25.00 cm/s MV E/A ratio:  3.76 Carlyle Dolly MD Electronically signed by Carlyle Dolly  MD Signature Date/Time: 08/16/2022/11:32:51  AM    Final    DG Chest Port 1 View  Result Date: 08/16/2022 CLINICAL DATA:  52 year old male with shortness of breath. Metastatic colon cancer. EXAM: PORTABLE CHEST 1 VIEW COMPARISON:  CT Abdomen and Pelvis 08/13/2022. FINDINGS: Portable AP semi upright view at 0736 hours. Stable right chest power port, accessed. Lung volumes and mediastinal contours have not significantly changed since 08/11/2022. Visualized tracheal air column is within normal limits. No pneumothorax. Asymmetric Patchy and confluent left greater than right lung opacity. Small superimposed layering pleural effusions demonstrated by CT on 08/13/2022. Lung base airspace opacity on that exam also. Ventilation has mildly improved since 08/11/2022. No pneumothorax. Stable visualized osseous structures. IMPRESSION: 1. Improved but unresolved patchy and confluent left greater than right nonspecific pulmonary opacity since 08/11/2022. This could reflect pulmonary infection or inflammation (including but not limited to drug reaction). Small pleural effusions recently demonstrated on CT likely persist. 2. No new cardiopulmonary abnormality. Electronically Signed   By: Genevie Ann M.D.   On: 08/16/2022 08:06   CT CYSTOGRAM ABD/PELVIS  Result Date: 08/13/2022 CLINICAL DATA:  Metastatic colon cancer, evaluate for vesicointestinal fistula EXAM: CT CYSTOGRAM (CT ABDOMEN AND PELVIS WITH CONTRAST) TECHNIQUE: Multi-detector CT imaging through the abdomen and pelvis was performed after dilute contrast had been introduced into the bladder for the purposes of performing CT cystography. RADIATION DOSE REDUCTION: This exam was performed according to the departmental dose-optimization program which includes automated exposure control, adjustment of the mA and/or kV according to patient size and/or use of iterative reconstruction technique. CONTRAST:  85m OMNIPAQUE IOHEXOL 350 MG/ML SOLN, 556mOMNIPAQUE IOHEXOL 300 MG/ML SOLN COMPARISON:  CT abdomen/pelvis without  contrast dated 08/10/2022 and 07/21/2022. CT abdomen/pelvis with contrast dated 07/13/2022. FINDINGS: Lower chest: Multifocal ground-glass opacities in the lungs bilaterally, perihilar distribution with subpleural sparing (series 5/image 1), include visualized. This is new from the recent prior and suggests interstitial edema. Small bilateral pleural effusions, left greater than right. Hepatobiliary: Two large central hepatic metastases measuring up to 8.0 cm (series 3/image 13). Additional smaller inferior right hepatic lobe lesion measures 3.2 cm. This appearance is progressive from prior enhanced study. Gallbladder is decompressed. No intrahepatic or extrahepatic duct dilatation. Pancreas: Within normal limits. Spleen: Within normal limits. Adrenals/Urinary Tract: Adrenal glands are within normal limits. Kidneys are within normal limits, noting reflux of contrast into the right renal collecting system. Mild fullness of the right renal collecting system without frank hydronephrosis. Mildly trabeculated bladder with indwelling Foley catheter and injected contrast with nondependent gas. Recent bladder pneumatosis is not well visualized on the current study, but there is irregular bladder wall thickening superiorly (series 8/image 21), related to the patient's suspected cystitis. Notably, on delayed imaging, there is no extraluminal reflux of contrast into a loop of small bowel, including adjacent small bowel (series 8/image 15) or the necrotic presacral mass (described below). Stomach/Bowel: Stomach is within normal limits. No evidence of bowel obstruction. Appendix is not discretely visualized. Status post left hemicolectomy with left mid abdominal colostomy. Large bowel containing parastomal hernia (series 3/image 85), unchanged. 4.2 x 6.4 cm necrotic presacral mass (series 3/image 86), at the site of prior fluid collection status post drainage, although currently favoring residual/recurrent rectal tumor rather  than a fluid collection/abscess. Persistent nondependent gas within this lesion (series 3/image 36), but when correlating with prior studies this is favored to be related to external communication (series 3/image 95) rather than vesicocolonic or enterocolonic fistula. Vascular/Lymphatic: No evidence of abdominal aortic  aneurysm. Atherosclerotic calcifications of the abdominal aorta and branch vessels. No suspicious abdominopelvic lymphadenopathy. Reproductive: Prostate is poorly visualized. Other: Small volume left retroperitoneal/pelvic ascites. No free air. Musculoskeletal: Visualized osseous structures are within normal limits. IMPRESSION: No evidence of vesicointestinal fistula. Irregular bladder thickening, likely reflecting cystitis, with indwelling Foley catheter. 4.2 x 6.4 cm necrotic presacral mass, at the site of prior fluid collection status post drainage, although currently favoring residual/recurrent rectal tumor rather than a fluid collection/abscess. Progressive hepatic metastases, as above. Suspected interstitial edema, incompletely visualized. Small bilateral pleural effusions, left greater than right. Additional ancillary findings as above. Electronically Signed   By: Julian Hy M.D.   On: 08/13/2022 18:42   DG Chest Port 1 View  Result Date: 08/11/2022 CLINICAL DATA:  52 year old male with history of pulmonary infiltrates. EXAM: PORTABLE CHEST 1 VIEW COMPARISON:  Chest x-ray 08/10/2022. FINDINGS: Right internal jugular single-lumen power porta cath with tip terminating in the superior cavoatrial junction. Widespread areas of interstitial prominence and patchy multifocal airspace consolidation scattered throughout the lungs bilaterally, most severe in the right mid lung and throughout the entirety of the left lung, particularly in the left upper lobe. No definite pleural effusions. No pneumothorax. Known lingular nodule partially obscured by airspace consolidation. Pulmonary vasculature  appears engorged, but there is no evidence of pulmonary edema. Heart size is upper limits of normal. Upper mediastinal contours are within normal limits. IMPRESSION: 1. Progressive worsening asymmetric airspace consolidation throughout the lungs bilaterally, concerning for severe multilobar bilateral pneumonia. 2. Known lingular nodule now obscured by airspace consolidation. Electronically Signed   By: Vinnie Langton M.D.   On: 08/11/2022 05:55   CT CHEST ABDOMEN PELVIS WO CONTRAST  Result Date: 08/10/2022 CLINICAL DATA:  History of metastatic colon cancer and recurrent intra-abdominal abscesses with sepsis, right lung rhonchi. * Tracking Code: BO * EXAM: CT CHEST, ABDOMEN AND PELVIS WITHOUT CONTRAST TECHNIQUE: Multidetector CT imaging of the chest, abdomen and pelvis was performed following the standard protocol without IV contrast. RADIATION DOSE REDUCTION: This exam was performed according to the departmental dose-optimization program which includes automated exposure control, adjustment of the mA and/or kV according to patient size and/or use of iterative reconstruction technique. COMPARISON:  Multiple priors including CT July 13, 2022 and July 21, 2022. FINDINGS: Despite efforts by the technologist and patient, motion artifact is present on today's exam and could not be eliminated. This reduces exam sensitivity and specificity. CT CHEST FINDINGS Cardiovascular: Accessed right chest Port-A-Cath with tip at the superior cavoatrial junction. Gas in the brachiocephalic vein from vascular access. Aortic atherosclerosis. Normal size heart. No significant pericardial effusion/thickening. Mediastinum/Nodes: Suspicious thyroid nodule. No pathologically enlarged mediastinal, hilar or axillary lymph nodes, noting limited sensitivity for the detection of hilar adenopathy on this noncontrast study. The esophagus is grossly unremarkable. Lungs/Pleura: New patchy bilateral ground-glass opacities. Linear band of  atelectasis associated with the lobular lingular pulmonary nodule now measuring 2.3 x 2.1 cm in total on image 94/5 previously 2.3 x 1.7 cm. Central right upper lobe pulmonary nodule measures 16 x 9 mm on image 48/5 previously 15 x 9 mm. Upper lobe pleuroparenchymal scarring. Musculoskeletal: No aggressive lytic or blastic lesion of bone. CT ABDOMEN PELVIS FINDINGS Hepatobiliary: Similar surgical changes of prior left hepatic lobe wedge resection. Ablation defect in the anterior right hepatic lobe appears similar. Indexed hepatic lesions are similar to slightly increased in size in comparison to prior, but evaluation is limited by lack of intravenous contrast material. For reference: -medial hepatic dome lesion measures  8.3 x 5.6 cm on image 47/3 previously 7.6 x 6.1 cm. -lateral hepatic dome lesion measures 5.7 x 3.5 cm on image 50/3 previously 5.2 x 3.5 cm. Hyperdense material in the gallbladder may reflect biliary sludge. Gallbladder is nondilated. No biliary ductal dilation. Pancreas: No pancreatic ductal dilation or evidence of acute inflammation. Spleen: No splenomegaly Adrenals/Urinary Tract: Bilateral adrenal glands are within normal limits. Mild bilateral hydro ureteral nephrosis to the level of the urinary bladder with perinephric and periureteric stranding. Gas in the urinary bladder wall with perivesicular stranding. Intraluminal gas in the gallbladder is well. Possible fistulous connection with the fluid and gas collection in the mesorectal/presacral space on image 122/3. Stomach/Bowel: Stomach is unremarkable for degree of distension. No pathologic dilation of small or large bowel. Similar surgical changes of left lower quadrant end colostomy with large peristomal hernia containing fat and nonobstructed loops of bowel. Similar appearance of the fluid and gas collection in the presacral/mesorectal space measuring approximally 4.2 x 2.7 cm on image 121/3 previously 4.7 x 2.1 cm. Vascular/Lymphatic:  Aortic atherosclerosis. Normal caliber abdominal aorta. No pathologically enlarged abdominal or pelvic lymph nodes. Reproductive: Prostate is unremarkable. Other: Increased pelvic soft tissue stranding/fluid. Prior perineal resection. Musculoskeletal: No aggressive lytic or blastic lesion of bone. Diffuse demineralization of bone. Multilevel degenerative changes spine. IMPRESSION: 1. 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 2. Mild bilateral hydroureteral nephrosis to the level of the urinary bladder with perinephric and periureteric stranding, suggestive of ascending urinary tract infection. 3. Similar appearance of the fluid and gas collection in the presacral/mesorectal space with possible fistulous connection with the urinary bladder. Consider further evaluation with nonemergent CT cystogram. 4. New patchy bilateral ground-glass opacities, which are nonspecific but favored to reflect an infectious or inflammatory etiology. 5. Similar surgical changes of left lower quadrant end colostomy with large peristomal hernia containing fat and nonobstructed loops of bowel. 6. Indexed hepatic lesions are similar to slightly increased in size in comparison to prior, but evaluation is limited by lack of intravenous contrast material, attention on follow-up contrasted examination suggested. 7. Increased size of the lingular pulmonary nodule at least somewhat enlarged secondary to new associated atelectasis, attention on follow-up imaging suggested. Stable right upper lobe pulmonary nodule. 8. Aortic Atherosclerosis (ICD10-I70.0) and Emphysema (ICD10-J43.9). These results were called by telephone at the time of interpretation on 08/10/2022 at 11:33 am to provider Margaretmary Eddy , who verbally acknowledged these results. Electronically Signed   By: Dahlia Bailiff M.D.   On: 08/10/2022 11:34   CT Head Wo Contrast  Result Date:  08/10/2022 CLINICAL DATA:  Recent craniectomy. EXAM: CT HEAD WITHOUT CONTRAST TECHNIQUE: Contiguous axial images were obtained from the base of the skull through the vertex without intravenous contrast. RADIATION DOSE REDUCTION: This exam was performed according to the departmental dose-optimization program which includes automated exposure control, adjustment of the mA and/or kV according to patient size and/or use of iterative reconstruction technique. COMPARISON:  MRI brain 08/05/2022.  Head CT 07/21/2022. FINDINGS: Brain: Postoperative changes of left frontal craniotomy and resection of the mass in the left middle frontal gyrus. Expected blood products and pneumocephalus in and around the resection cavity. Decreased surrounding vasogenic edema, mass effect and rightward midline shift. No significant change from recent brain MRI. Vascular: No hyperdense vessel or unexpected calcification. Skull: Recent left frontal craniotomy. Sinuses/Orbits: Unremarkable. Other: None. IMPRESSION: Postoperative changes of left frontal craniotomy and resection of the mass in the  left middle frontal gyrus. Expected blood products and pneumocephalus in and around the resection cavity. Decreased surrounding vasogenic edema, mass effect, and rightward midline shift. No significant change from recent brain MRI. Electronically Signed   By: Emmit Alexanders M.D.   On: 08/10/2022 11:07   DG Chest Portable 1 View  Result Date: 08/10/2022 CLINICAL DATA:  Sepsis. EXAM: PORTABLE CHEST 1 VIEW COMPARISON:  November 30, 2020.  July 13, 2022. FINDINGS: Stable cardiomediastinal silhouette. Right internal jugular Port-A-Cath is noted with distal tip in expected position of the SVC. Minimal bibasilar subsegmental atelectasis or edema is noted. Rounded nodular density is noted in the left lower lobe consistent with metastatic disease as noted on prior CT scan. Bony thorax is unremarkable. IMPRESSION: Rounded nodular density seen in left lower lobe  consistent with metastatic disease as noted on prior CT scan. Minimal bibasilar subsegmental atelectasis or edema is noted. Electronically Signed   By: Marijo Conception M.D.   On: 08/10/2022 08:48   MR BRAIN W WO CONTRAST  Result Date: 08/06/2022 CLINICAL DATA:  Follow-up examination status post craniotomy for tumor resection. EXAM: MRI HEAD WITHOUT AND WITH CONTRAST TECHNIQUE: Multiplanar, multiecho pulse sequences of the brain and surrounding structures were obtained without and with intravenous contrast. CONTRAST:  45m GADAVIST GADOBUTROL 1 MMOL/ML IV SOLN COMPARISON:  Prior study from 07/23/2022. FINDINGS: Brain: Postoperative changes from interval left frontal craniotomy for tumor resection. Small amount of postoperative pneumocephalus overlies the anterior left frontal convexity. Small extra-axial collection subjacent to the craniotomy bone flap measures no more than 3-4 mm without significant mass effect. The previously seen left frontal lobe mass has been resected, with postoperative blood products seen within the resection cavity. No visible nodular or concerning enhancement to suggest residual tumor. No peri resection infarct or other complication. Persistent vasogenic edema within the left frontal lobe with 7 mm of left-to-right shift, similar. No hydrocephalus or trapping. Basilar cisterns remain patent. Remainder of the brain is otherwise normal in otherwise unchanged. No acute or subacute infarct. No other mass lesion or abnormal enhancement. Vascular: Major intracranial vascular flow voids are maintained. Skull and upper cervical spine: Craniocervical junction within normal limits. Bone marrow signal intensity normal. Post craniotomy changes at the left frontal calvarium. Skin staples in place. Sinuses/Orbits: Globes orbital soft tissues demonstrate no acute finding. Paranasal sinuses and mastoid air cells are largely clear. Other: None. IMPRESSION: 1. Postoperative changes from interval left  frontal craniotomy for tumor resection without adverse features. No visible residual tumor. 2. Persistent vasogenic edema within the left frontal lobe with 7 mm of left-to-right shift. Electronically Signed   By: BJeannine BogaM.D.   On: 08/06/2022 03:33   MR BRAIN W WO CONTRAST  Result Date: 07/23/2022 CLINICAL DATA:  Brain/CNS neoplasm, staging SRS with BRAIN LAB scan at [facility] EXAM: MRI HEAD WITHOUT AND WITH CONTRAST TECHNIQUE: Multiplanar, multiecho pulse sequences of the brain and surrounding structures were obtained without and with intravenous contrast. CONTRAST:  7.52mGADAVIST GADOBUTROL 1 MMOL/ML IV SOLN COMPARISON:  CT head from July 21, 2022. FINDINGS: Brain: 3.0 x 3.4 by 2.9 cm peripherally enhancing mass in the left frontal lobe superiorly. Surrounding edema and mass effect with approximately 7 mm of rightward midline shift at this site. Mild effacement the left lateral ventricle. No evidence of hydrocephalus, acute hemorrhage, acute infarct or extra-axial fluid collection. Vascular: Major arterial flow voids are maintained at the skull base. Skull and upper cervical spine: Normal marrow signal. Sinuses/Orbits: Clear sinuses.  No acute  orbital findings. Other: No mastoid effusions. IMPRESSION: Findings compatible with a single 3.4 cm metastasis in the left frontal lobe, described above. Electronically Signed   By: Margaretha Sheffield M.D.   On: 07/23/2022 17:17   CT ABDOMEN PELVIS WO CONTRAST  Result Date: 07/22/2022 CLINICAL DATA:  Acute nonlocalized abdominal pain. Left-sided abdominal distention. EXAM: CT ABDOMEN AND PELVIS WITHOUT CONTRAST TECHNIQUE: Multidetector CT imaging of the abdomen and pelvis was performed following the standard protocol without IV contrast. RADIATION DOSE REDUCTION: This exam was performed according to the departmental dose-optimization program which includes automated exposure control, adjustment of the mA and/or kV according to patient size and/or use of  iterative reconstruction technique. COMPARISON:  07/13/2022 FINDINGS: Lower chest: Motion artifact limits evaluation. Lobulated nodule in the left lingula measuring 1.9 cm diameter, unchanged. This is likely metastatic. Hepatobiliary: Liver lesions are again demonstrated although poorly defined on noncontrast imaging. Largest mass is in the dome of the liver, measuring about 7.8 cm diameter. This corresponds to known liver metastasis. There is limited ability to compare the lesion to the previous contrast-enhanced scan. Partial left hepatic resection. Gallbladder and bile ducts are unremarkable. Pancreas: Unremarkable. No pancreatic ductal dilatation or surrounding inflammatory changes. Spleen: Normal in size without focal abnormality. Adrenals/Urinary Tract: No adrenal gland nodules. There is mild bilateral hydronephrosis. No renal or ureteral stones are demonstrated. The bladder is distended without wall thickening. Changes likely to represent urine stasis or reflux. Stomach/Bowel: Stomach, small bowel, and colon are not abnormally distended. There is a partial colonic resection with left lower quadrant descending colostomy. There is a large peristomal hernia containing small and large bowel. This appears similar to the prior study. Soft tissue infiltration and fluid in the presacral space with infiltration or scarring into the surrounding perirectal fat. This appearance is similar to the prior study and could represent either or combination of residual/recurrent tumor and postoperative change. Continued attention on follow-up imaging is suggested. Vascular/Lymphatic: Aortic atherosclerosis. No enlarged abdominal or pelvic lymph nodes. Reproductive: Prostate is unremarkable. Other: No free air or free fluid in the abdomen. Musculoskeletal: Degenerative changes in the spine and hips. No destructive bone lesions. IMPRESSION: 1. No significant change since prior study. 2. Again demonstrated are metastatic lesions  throughout the liver and in the left lingula. 3. Postoperative changes with partial colectomy. Left lower quadrant ileostomy with large peristomal hernia, similar to prior study. No evidence of proximal obstruction. Gas and soft tissue in the presacral space may represent post treatment changes or residual/recurrent tumor. Follow-up as indicated. 4. Bilateral hydronephrosis and hydroureter with distended bladder, likely representing reflux disease. Bladder distention could be physiologic or due to outlet obstruction or decreased motility. Electronically Signed   By: Lucienne Capers M.D.   On: 07/22/2022 00:11   CT Head Wo Contrast  Result Date: 07/21/2022 CLINICAL DATA:  Altered mental status EXAM: CT HEAD WITHOUT CONTRAST TECHNIQUE: Contiguous axial images were obtained from the base of the skull through the vertex without intravenous contrast. RADIATION DOSE REDUCTION: This exam was performed according to the departmental dose-optimization program which includes automated exposure control, adjustment of the mA and/or kV according to patient size and/or use of iterative reconstruction technique. COMPARISON:  None Available. FINDINGS: Brain: Hyperdense left frontal lobe mass measuring by 3 x 2.8 x 2.5 cm. Considerable surrounding edema and local mass effect. 6 mm midline shift to the right. Compression of the right frontal horn of left lateral ventricle. Diffuse sulcal swelling on the left consistent with edema Vascular: No hyperdense  vessels.  Carotid vascular calcification Skull: Normal. Negative for fracture or focal lesion. Sinuses/Orbits: No acute finding. Other: None IMPRESSION: 3 cm hyperdense left frontal lobe mass with prominent surrounding edema and local mass effect. 6 mm midline shift to the right. Findings favored to represent metastatic disease over primary brain neoplasm given history of metastatic rectal cancer. Critical Value/emergent results were called by telephone at the time of  interpretation on 07/21/2022 at 11:31 pm to provider PA Deno Etienne , who verbally acknowledged these results. Electronically Signed   By: Donavan Foil M.D.   On: 07/21/2022 23:35    Today   Subjective    James Coffey today has no headache,no chest abdominal pain,no new weakness tingling or numbness, feels much better wants to go home today.    Objective   Blood pressure 108/69, pulse (!) 48, temperature 98.1 F (36.7 C), temperature source Oral, resp. rate 12, height '5\' 9"'$  (1.753 m), weight 76.9 kg, SpO2 100 %.   Intake/Output Summary (Last 24 hours) at 08/19/2022 1105 Last data filed at 08/19/2022 0900 Gross per 24 hour  Intake 1079.12 ml  Output 2050 ml  Net -970.88 ml    Exam  Awake Alert, No new F.N deficits, Normal affect Cranial post op scar stable, R.P.Cath, LLQ Colostomy, Foley Supple Neck, No JVD,   Symmetrical Chest wall movement, Good air movement bilaterally, CTAB RRR,No Gallops, Rubs or new Murmurs,  +ve B.Sounds, Abd Soft, No tenderness,   No Cyanosis, Clubbing or edema     Data Review   Recent Labs  Lab 08/15/22 0324 08/16/22 0333 08/17/22 0212 08/18/22 0340 08/18/22 2051  WBC 6.3 10.4 7.8 8.3 8.0  HGB 8.3* 8.0* 7.6* 8.0* 7.9*  HCT 25.4* 24.8* 24.5* 25.5* 26.2*  PLT 45* 64* 69* 84* 94*  MCV 87.6 88.6 91.8 91.7 94.9  MCH 28.6 28.6 28.5 28.8 28.6  MCHC 32.7 32.3 31.0 31.4 30.2  RDW 23.1* 23.5* 23.9* 24.7* 25.7*  LYMPHSABS  --  0.7 0.4* 0.5* 0.3*  MONOABS  --  0.2 0.1 0.1 0.1  EOSABS  --  0.0 0.0 0.0 0.0  BASOSABS  --  0.0 0.0 0.0 0.0    Recent Labs  Lab 08/14/22 1110 08/15/22 0324 08/16/22 0333 08/16/22 0618 08/16/22 1117 08/16/22 1418 08/17/22 0212 08/18/22 0340 08/18/22 2051  NA 143 140 141  --   --   --  136 139  --   K 4.2 4.0 4.0  --   --   --  3.6 3.5  --   CL 107 106 108  --   --   --  104 109  --   CO2 '24 25 27  '$ --   --   --  27 21*  --   ANIONGAP '12 9 6  '$ --   --   --  5 9  --   GLUCOSE 170* 193* 96  --   --   --  135*  74  --   BUN 34* 27* 23*  --   --   --  20 17  --   CREATININE 0.91 0.84 0.74  --   --   --  0.70 0.64  --   AST  --   --  32  --   --   --  30 27  --   ALT  --   --  120*  --   --   --  89* 72*  --   ALKPHOS  --   --  165*  --   --   --  241* 246*  --   BILITOT  --   --  2.9*  --   --   --  2.8* 2.2*  --   ALBUMIN  --   --  2.0*  --   --   --  1.9* 1.9*  --   CRP  --   --   --  7.6*  --   --  12.2* 9.1*  --   PROCALCITON  --   --   --  4.81  --   --  2.67 1.62  --   LATICACIDVEN  --   --   --   --  1.7 1.5  --   --   --   INR  --   --   --   --   --   --   --   --  1.2  TSH  --  0.577 1.071  --   --   --   --   --   --   BNP  --   --  821.7*  --   --   --  1,071.9* 1,428.7*  --   MG 1.9 1.8 1.6*  --   --   --  2.0 1.7  --   CALCIUM 9.0 8.5* 8.3*  --   --   --  7.9* 7.8*  --      Total Time in preparing paper work, data evaluation and todays exam - 35 minutes  Signature  -    Lala Lund M.D on 08/19/2022 at 11:05 AM   -  To page go to [email or web address]

## 2022-08-19 NOTE — Progress Notes (Signed)
Occupational Therapy Treatment Patient Details Name: James Coffey MRN: [id number] DOB: 1970-11-27 Today's Date: 08/19/2022   History of present illness 52 y.o. male who presents on 08/10/22 with fever x several days. Was admitted with septic shock, acute respiratory failure, AKI, encephalopathy. PMH includes rectal cancer with mets to liver (L lateral hepatectomy 08/28/21), lungs, and brain (L frontal craniotomy for tumor resection on 08/05/22), fournier gangrene, perianal abscess, loop colostomy.   OT comments  Focus of session educating pt and wife in energy conservation strategies and technique for transferring to tub transfer bench. Pt does not require BSC as he is returning home with foley. Pt participated in grooming seated EOB. Wife appears to be aware of level of care pt requires and reports family is able to assist him.    Recommendations for follow up therapy are one component of a multi-disciplinary discharge planning process, led by the attending physician.  Recommendations may be updated based on patient status, additional functional criteria and insurance authorization.    Follow Up Recommendations  Home health OT     Assistance Recommended at Discharge Frequent or constant Supervision/Assistance  Patient can return home with the following  A little help with walking and/or transfers;A lot of help with bathing/dressing/bathroom;Assistance with cooking/housework;Direct supervision/assist for medications management;Direct supervision/assist for financial management;Assist for transportation;Help with stairs or ramp for entrance   Equipment Recommendations  Tub/shower bench;Wheelchair (measurements OT);Wheelchair cushion (measurements OT)    Recommendations for Other Services      Precautions / Restrictions Precautions Precautions: Fall Restrictions Weight Bearing Restrictions: No Other Position/Activity Restrictions: colostomy, recent crani, going home with foley        Mobility Bed Mobility Overal bed mobility: Needs Assistance Bed Mobility: Supine to Sit, Sit to Supine     Supine to sit: HOB elevated, Mod assist Sit to supine: Mod assist   General bed mobility comments: assist to raise trunk and for LEs back into bed    Transfers                         Balance Overall balance assessment: Needs assistance   Sitting balance-Leahy Scale: Fair                                     ADL either performed or assessed with clinical judgement   ADL Overall ADL's : Needs assistance/impaired Eating/Feeding: Independent;Sitting   Grooming: Wash/dry hands;Wash/dry face;Set up;Sitting                                 General ADL Comments: Educated in energy conservation strategies as endurance likely to vary.    Extremity/Trunk Assessment              Vision       Perception     Praxis      Cognition Arousal/Alertness: Awake/alert Behavior During Therapy: Flat affect Overall Cognitive Status: Impaired/Different from baseline Area of Impairment: Safety/judgement, Problem solving                         Safety/Judgement: Decreased awareness of safety, Decreased awareness of deficits   Problem Solving: Slow processing, Requires verbal cues General Comments: Educated wife and pt in use of tub transfer bench to avoid stepping over edge of tub. Pt does not require 3 in1  as the plan is to send him home with a foley. Instructed in safe use of w/c.        Exercises      Shoulder Instructions       General Comments      Pertinent Vitals/ Pain       Pain Assessment Pain Assessment: Faces Faces Pain Scale: Hurts little more Pain Location: generalized Pain Descriptors / Indicators: Grimacing, Guarding, Discomfort Pain Intervention(s): Monitored during session, Repositioned  Home Living                                          Prior Functioning/Environment               Frequency  Min 2X/week        Progress Toward Goals  OT Goals(current goals can now be found in the care plan section)  Progress towards OT goals: Progressing toward goals  Acute Rehab OT Goals OT Goal Formulation: With patient/family Time For Goal Achievement: 08/31/22 Potential to Achieve Goals: Good  Plan Discharge plan remains appropriate    Co-evaluation                 AM-PAC OT "6 Clicks" Daily Activity     Outcome Measure   Help from another person eating meals?: None Help from another person taking care of personal grooming?: A Little Help from another person toileting, which includes using toliet, bedpan, or urinal?: A Lot Help from another person bathing (including washing, rinsing, drying)?: A Little Help from another person to put on and taking off regular upper body clothing?: A Lot Help from another person to put on and taking off regular lower body clothing?: Total 6 Click Score: 15    End of Session    OT Visit Diagnosis: Unsteadiness on feet (R26.81);Other abnormalities of gait and mobility (R26.89);Pain;Muscle weakness (generalized) (M62.81);Other (comment) Pain - Right/Left: Right   Activity Tolerance Patient tolerated treatment well   Patient Left in bed;with call bell/phone within reach;with bed alarm set;with family/visitor present   Nurse Communication          Time: [id number] OT Time Calculation (min): 16 min  Charges: OT General Charges $OT Visit: 1 Visit OT Treatments $Self Care/Home Management : 8-22 mins  Cleta Alberts, OTR/L Acute Rehabilitation Services Office: [phone]   James Coffey 08/19/2022, 1:56 PM

## 2022-08-19 NOTE — TOC Transition Note (Addendum)
Transition of Care [facility]) - CM/SW Discharge Note   Patient Details  Name: Sirr Marquez MRN: [id number] Date of Birth: [birth year]  Transition of Care [facility]) CM/SW Contact:  Levonne Lapping, RN Phone Number: 08/19/2022, 10:51 AM   Clinical Narrative:     Update : 1:56 PM [employer] will provide the requested Tub Transfer bench ([employer] out of stock) . [facility] has now declined referral as RN was also needed and they cannot provide one due to staffing. Daughter (Nursing student) and Patient's Aunt Investment banker, corporate) both agreed they would handle Foley care but [facility] still declined.  [facility] has agreed to take this referral. Daughter and Family members to assist in translating.  Dr Fabienne Bruns, Patients oncologist has agreed to follow for home health, per Dr Annett Fabian RN, [place]ust[place]io Maize. AVS is updated   Patient will DC to home today- Daughter to transport. Patient will have home health from [place] (PT). Daughter is a Presenter, broadcasting and Elenor Legato is a Marine scientist and they will handle foley care. [facility] has no nursing available at the moment . Wheelchair has been delivered to room from [employer]. Daughter has requested the [facility] get picked up- Patient has a foley and ostomy so no need. Shower chair is needed and will be delivered to the home, per [place]. Patients spouse to transport home. No additional TOC needs identified        Final next level of care: Home w Home Health Services Barriers to Discharge: Continued Medical Work up   Patient Goals and CMS Choice CMS Medicare.gov Compare Post Acute Care list provided to:: Patient Choice offered to / list presented to : Patient, Spouse, Adult Children (Daughter, Engineer, technical sales)  Discharge Placement                         Discharge Plan and Services Additional resources added to the After Visit Summary for     Discharge Planning Services: CM Consult Post Acute Care Choice: Home Health          DME Arranged: N/A DME Agency: NA       HH Arranged: PT HH Agency: [place] Date [place]: 08/13/22 Time [place]: [id number] Representative spoke with at [place]: [place] Determinants of Health ([place]) Interventions SDOH Screenings   Food Insecurity: No Food Insecurity (07/23/2022)  Housing: Low Risk  (07/23/2022)  Transportation Needs: No Transportation Needs (07/23/2022)  Utilities: Not At Risk (07/23/2022)  Depression (PHQ2-9): Medium Risk (01/31/2021)  Tobacco Use: High Risk (08/10/2022)     Readmission Risk Interventions    03/22/2022   12:38 PM  Readmission Risk Prevention Plan  Transportation Screening Complete  PCP or Specialist Appt within 5-7 Days Complete  Home Care Screening Complete  Medication Review (RN CM) Complete

## 2022-08-21 ENCOUNTER — Inpatient Hospital Stay: Payer: Medicaid Other

## 2022-08-21 ENCOUNTER — Inpatient Hospital Stay: Payer: Medicaid Other | Admitting: Hematology

## 2022-08-24 ENCOUNTER — Other Ambulatory Visit: Payer: Medicaid Other

## 2022-08-24 ENCOUNTER — Ambulatory Visit: Payer: Medicaid Other | Admitting: Hematology

## 2022-08-26 ENCOUNTER — Telehealth: Payer: Medicaid Other | Admitting: Physician Assistant

## 2022-08-26 DIAGNOSIS — B370 Candidal stomatitis: Secondary | ICD-10-CM | POA: Diagnosis not present

## 2022-08-26 MED ORDER — NYSTATIN 100000 UNIT/ML MT SUSP: 5.0000 mL | Freq: Four times a day (QID) | OROMUCOSAL | 0 refills | Status: DC

## 2022-08-26 NOTE — Progress Notes (Signed)
E-Visit for Mouth Ulcers  We are sorry that you are not feeling well.  Here is how we plan to help!  Based on what you have shared with me, it appears that you do have thrush.     The following medications should decrease the discomfort and help with healing. Nystatin mouthwash Use  50mL every 6 hours swish and swallow for 5 days   While the exact causes are unknown, some common causes and factors that may aggravate mouth ulcers include: Genetics - Sometimes mouth ulcers run in families High alcohol intake Acidic foods such as citrus fruits like pineapple, grapefruit, orange fruits/juices, may aggravate mouth ulcers Other foods high in acidity or spice such as coffee, chocolate, chips, pretzels, eggs, nuts, cheese Quitting smoking Injury caused by biting the tongue or inside of the cheek Diet lacking in 0000000, zinc, folic acid or iron Male hormone shifts with menstruation Excessive fatigue, emotional stress or anxiety Prevention: Talk to your doctor if you are taking meds that are known to cause mouth ulcers such as:   Anti-inflammatory drugs (for example Ibuprofen, Naproxen sodium), pain killers, Beta blockers, Oral nicotine replacement drugs, Some street drugs (heroin).   Avoid allowing any tablets to dissolve in your mouth that are meant to swallowed whole Avoid foods/drinks that trigger or worsen symptoms Keep your mouth clean with daily brushing and flossing  Home Care: The goal with treatment is to ease the pain where ulcers occur and help them heal as quickly as possible.  There is no medical treatment to prevent mouth ulcers from coming back or recurring.  Avoid spicy and acidic foods Eat soft foods and avoid rough, crunchy foods Avoid chewing gum Do not use toothpaste that contains sodium lauryl sulphite Use a straw to drink which helps avoid liquids toughing the ulcers near the front of your mouth Use a very soft toothbrush If you have dentures or dental hardware that you  feel is not fitting well or contributing to his, please see your dentist. Use saltwater mouthwash which helps healing. Dissolve a  teaspoon of salt in a glass of warm water. Swish around your mouth and spit it out. This can be used as needed if it is soothing.   GET HELP RIGHT AWAY IF: Persistent ulcers require checking IN PERSON (face to face). Any mouth lesion lasting longer than a month should be seen by your DENTIST as soon as possible for evaluation for possible oral cancer. If you have a non-painful ulcer in 1 or more areas of your mouth Ulcers that are spreading, are very large or particularly painful Ulcers last longer than one week without improving on treatment If you develop a fever, swollen glands and begin to feel unwell Ulcers that developed after starting a new medication MAKE SURE YOU: Understand these instructions. Will watch your condition. Will get help right away if you are not doing well or get worse.  Thank you for choosing an e-visit.  Your e-visit answers were reviewed by a board certified advanced clinical practitioner to complete your personal care plan. Depending upon the condition, your plan could have included both over the counter or prescription medications.  Please review your pharmacy choice. Make sure the pharmacy is open so you can pick up prescription now. If there is a problem, you may contact your provider through [employer] and have the prescription routed to another pharmacy.  Your safety is important to [place]. If you have drug allergies check your prescription carefully.   For the  next 24 hours you can use MyChart to ask questions about today's visit, request a non-urgent call back, or ask for a work or school excuse. You will get an email in the next two days asking about your experience. I hope that your e-visit has been valuable and will speed your recovery.  I have spent 5 minutes in review of e-visit questionnaire, review and updating patient  chart, medical decision making and response to patient.   Mar Daring, PA-C

## 2022-08-31 ENCOUNTER — Other Ambulatory Visit: Payer: Self-pay | Admitting: Radiation Therapy

## 2022-08-31 ENCOUNTER — Other Ambulatory Visit: Payer: Self-pay | Admitting: Radiology

## 2022-08-31 DIAGNOSIS — C787 Secondary malignant neoplasm of liver and intrahepatic bile duct: Secondary | ICD-10-CM

## 2022-08-31 DIAGNOSIS — C7931 Secondary malignant neoplasm of brain: Secondary | ICD-10-CM

## 2022-08-31 NOTE — H&P (Signed)
Referring Physician(s): [place]  Supervising Physician: Aletta Edouard  Patient Status:  [facility]  Chief Complaint: Metastatic colon cancer to liver   Subjective: James Coffey is a 52 y.o. 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 He is status post resection of a segment III left lobe metastatic lesion by Dr. [name] on 08/28/2021.  He underwent AP resection of rectal carcinoma on 02/04/2022 by Dr. [name] with pathology demonstrating moderately differentiated invasive carcinoma with focally positive margin, lymphovascular invasion, perineural invasion and metastatic carcinoma in 5 of 10 lymph nodes. A postoperative presacral fluid collection was drained by percutaneous catheter drainage on 03/17/2022.The drain was removed on 04/28/2022.  CT on 04/28/2022 demonstrated 2 new metastatic lesions in the superior liver with a 3.5 x 2.9 cm lesion at the boundary of segments IV and V and a 2.7 x 2.1 cm lesion in the lateral dome of the right lobe in segment VIII.  Both lesions showed increase in size on 06/24/2022 with the larger measuring 6 x 5.8 cm and the more peripheral lesion 5.0 x 3.3 cm.  Follow-up CT on 07/13/2022 demonstrates stable size of lingular and right upper lobe pulmonary nodules after prior radiation therapy and increase in the liver lesions now measuring 7.6 x 6.1 cm and 5.2 x 3.5 cm.  Previously resected and ablated lesions have not recurred.  Latest CT chest abdomen pelvis performed on 08/10/2022 revealed:  1. 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 2. Mild bilateral hydroureteral nephrosis to the level of the urinary bladder with perinephric and periureteric stranding, suggestive of ascending urinary tract infection. 3. Similar  appearance of the fluid and gas collection in the presacral/mesorectal space with possible fistulous connection with the urinary bladder. Consider further evaluation with nonemergent CT cystogram. 4. New patchy bilateral ground-glass opacities, which are nonspecific but favored to reflect an infectious or inflammatory etiology. 5. Similar surgical changes of left lower quadrant end colostomy with large peristomal hernia containing fat and nonobstructed loops of bowel. 6. Indexed hepatic lesions are similar to slightly increased in size in comparison to prior, but evaluation is limited by lack of intravenous contrast material, attention on follow-up contrasted examination suggested. 7. Increased size of the lingular pulmonary nodule at least somewhat enlarged secondary to new associated atelectasis, attention on follow-up imaging suggested. Stable right upper lobe pulmonary nodule. 8. Aortic Atherosclerosis (ICD10-I70.0) and Emphysema  Follow-up CT cystogram revealed no evidence of vesicle -intestinal fistula.  Following discussions with Dr. Kathlene Cote patient was deemed an appropriate candidate for hepatic Y90 radioembolization and presents today for visceral/hepatic arterial roadmapping study with possible embolization/test Y-90 dosing.        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   pt denies   Cancer ([place])    rectal cancer with mets to liver   Tobacco abuse    Past Surgical History:  Procedure Laterality Date   APPLICATION OF CRANIAL NAVIGATION N/A 08/05/2022   Procedure: APPLICATION OF CRANIAL NAVIGATION;  Surgeon: [name], MD;  Location: [place];  Service: Neurosurgery;  Laterality: N/A;   CRANIOTOMY Left 08/05/2022   Procedure: Left frontal craniotomy for resection of tumor with Stealth;  Surgeon: [name], MD;  Location: [place];  Service: Neurosurgery;  Laterality: Left;  RM 19 to follow   HERNIA REPAIR     INCISION  AND DRAINAGE ABSCESS N/A 12/01/2020   Procedure: INCISION AND DRAINAGE ABSCESS;  Surgeon: Michael Boston, MD;  [facility];  Service: General;  Laterality: N/A;   INCISION AND DRAINAGE ABSCESS N/A 12/04/2020   Procedure: INCISION AND DRAINAGE FOURNIERS GANGRENE;  Surgeon: [name], MD;  [facility];  Service: General;  Laterality: N/A;   INCISION AND DRAINAGE PERIRECTAL ABSCESS N/A 11/30/2020   Procedure: INCISION AND DEBRIDEMENT OF PERIRECTAL ABSCESS AND RECTAL WALL BIOPSIES;  Surgeon: Michael Boston, MD;  [facility];  Service: General;  Laterality: N/A;   INGUINAL HERNIA REPAIR Right 2007   pt denies   IR IMAGING GUIDED PORT INSERTION  05/07/2021   IR RADIOLOGIST EVAL & MGMT  08/27/2021   IR RADIOLOGIST EVAL & MGMT  09/12/2021   IR RADIOLOGIST EVAL & MGMT  11/10/2021   IR RADIOLOGIST EVAL & MGMT  01/05/2022   IR SINUS/FIST TUBE CHK-NON GI  04/28/2022   LAPAROSCOPIC LOOP COLOSTOMY N/A 12/13/2020   Procedure: DIAGNOSTIC LAPAROSCOPY WITH  OPEN DESCENDING LOOP COLOSTOMY;  Surgeon: Armandina Gemma, MD;  [facility];  Service: General;  Laterality: N/A;   LAPAROSCOPY N/A 08/28/2021   Procedure: LAPAROSCOPY DIAGNOSTIC;  Surgeon: Dwan Bolt, MD;  Location: [place];  Service: General;  Laterality: N/A;   OPEN PARTIAL HEPATECTOMY  Left 08/28/2021   Procedure: OPEN LEFT LATERAL HEPATECTOMY WITH INTRAOPERATIVE ULTRASOUND;  Surgeon: Dwan Bolt, MD;  Location: [place];  Service: General;  Laterality: Left;   RADIOLOGY WITH ANESTHESIA N/A 10/08/2021   Procedure: MICROWAVE ABLATION;  Surgeon: Aletta Edouard, MD;  [facility];  Service: Radiology;  Laterality: N/A;   RECTAL SURGERY     XI ROBOTIC ASSISTED PARASTOMAL HERNIA REPAIR N/A 02/04/2022   Procedure: ROBOTIC ABDOMINAL PERINEAL RESECTION, PARASTOMAL HERNIA REPAIR, BILATERAL TAP BLOCK;  Surgeon: [name], MD;  [facility];  Service: General;  Laterality: N/A;       Allergies: Hydrocodone and  Pork-derived products  Medications: Prior to Admission medications   Medication Sig Start Date End Date Taking? Authorizing Provider  acetaminophen (TYLENOL) 500 MG tablet Take 2 tablets (1,000 mg total) by mouth every 8 (eight) hours as needed for mild pain or fever. 12/19/20   Norm Parcel, PA-C  amoxicillin-clavulanate (AUGMENTIN) 875-125 MG tablet Take 1 tablet by mouth 2 (two) times daily. 08/19/22   Thurnell Lose, MD  cyanocobalamin (VITAMIN B12) 1000 MCG tablet Take 1 tablet (1,000 mcg total) by mouth daily. 07/25/22   Florencia Reasons, MD  dexamethasone (DECADRON) 2 MG tablet Take 1 tablet (2 mg total) by mouth daily. 08/19/22   Thurnell Lose, MD  levETIRAcetam (KEPPRA) 500 MG tablet Take 1 tablet (500 mg total) by mouth 2 (two) times daily. 08/19/22   Thurnell Lose, MD  LORazepam (ATIVAN) 0.5 MG tablet Take 1 tablet (0.5 mg total) by mouth at bedtime. Patient taking differently: Take 0.5 mg by mouth 3 (three) times daily as needed for anxiety or sleep. 07/20/22   [name], MD  midodrine (PROAMATINE) 10 MG tablet Take 1 tablet (10 mg total) by mouth 3 (three) times daily with meals. 08/19/22   Thurnell Lose, MD  nystatin (MYCOSTATIN) 100000 UNIT/ML suspension Take 5 mLs (500,000 Units total) by mouth 4 (four) times daily. 08/26/22   Mar Daring, PA-C  oxyCODONE (OXY IR/ROXICODONE) 5 MG immediate release tablet Take 1 tablet (5 mg total) by mouth  3 (three) times daily as needed for moderate pain or severe pain. 08/19/22   Thurnell Lose, MD  pantoprazole (PROTONIX) 40 MG tablet Take 1 tablet (40 mg total) by mouth daily. 07/25/22 08/24/22  Florencia Reasons, MD  polyethylene glycol (MIRALAX) 17 g packet Take 17 g by mouth daily. Take daily to maintain regular soft colostomy output. Can increase to twice a day if need. Patient taking differently: Take 17 g by mouth daily as needed for mild constipation. 06/24/22   Winferd Humphrey, PA-C  prochlorperazine (COMPAZINE) 10 MG  tablet Take 1 tablet (10 mg total) by mouth every 6 (six) hours as needed for nausea or vomiting. Patient taking differently: Take 10 mg by mouth 3 (three) times daily as needed for nausea or vomiting. 07/20/22   [name], MD  tamsulosin (FLOMAX) 0.4 MG CAPS capsule Take 1 capsule (0.4 mg total) by mouth daily after supper. 07/25/22   Florencia Reasons, MD     Vital Signs:    Code Status:   Physical Exam  Imaging: No results found.  Labs:  CBC: Recent Labs    08/16/22 0333 08/17/22 0212 08/18/22 0340 08/18/22 2051  WBC 10.4 7.8 8.3 8.0  HGB 8.0* 7.6* 8.0* 7.9*  HCT 24.8* 24.5* 25.5* 26.2*  PLT 64* 69* 84* 94*    COAGS: Recent Labs    10/02/21 0907 03/17/22 0438 08/10/22 0809 08/18/22 2051  INR 1.0 1.4* 1.6* 1.2    BMP: Recent Labs    08/15/22 0324 08/16/22 0333 08/17/22 0212 08/18/22 0340  NA 140 141 136 139  K 4.0 4.0 3.6 3.5  CL 106 108 104 109  CO2 25 27 27  21*  GLUCOSE 193* 96 135* 74  BUN 27* 23* 20 17  CALCIUM 8.5* 8.3* 7.9* 7.8*  CREATININE 0.84 0.74 0.70 0.64  GFRNONAA >60 >60 >60 >60    LIVER FUNCTION TESTS: Recent Labs    08/11/22 0336 08/16/22 0333 08/17/22 0212 08/18/22 0340  BILITOT 3.5* 2.9* 2.8* 2.2*  AST 472* 32 30 27  ALT 340* 120* 89* 72*  ALKPHOS 109 165* 241* 246*  PROT 5.8* 4.6* 4.5* 4.6*  ALBUMIN 3.3* 2.0* 1.9* 1.9*    Assessment and Plan: James Coffey is a 52 y.o. 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 He is status post resection of a segment III left lobe metastatic lesion by Dr. [name] on 08/28/2021.  He underwent AP resection of rectal carcinoma on 02/04/2022 by Dr. [name] with pathology demonstrating moderately differentiated invasive carcinoma with focally positive margin, lymphovascular invasion, perineural invasion and metastatic carcinoma in 5  of 10 lymph nodes. A postoperative presacral fluid collection was drained by percutaneous catheter drainage on 03/17/2022.The drain was removed on 04/28/2022.  CT on 04/28/2022 demonstrated 2 new metastatic lesions in the superior liver with a 3.5 x 2.9 cm lesion at the boundary of segments IV and V and a 2.7 x 2.1 cm lesion in the lateral dome of the right lobe in segment VIII.  Both lesions showed increase in size on 06/24/2022 with the larger measuring 6 x 5.8 cm and the more peripheral lesion 5.0 x 3.3 cm.  Follow-up CT on 07/13/2022 demonstrates stable size of lingular and right upper lobe pulmonary nodules after prior radiation therapy and increase in the liver lesions now measuring 7.6 x 6.1 cm and  5.2 x 3.5 cm.  Previously resected and ablated lesions have not recurred.  Latest CT chest abdomen pelvis performed on 08/10/2022 revealed:  1. 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 2. Mild bilateral hydroureteral nephrosis to the level of the urinary bladder with perinephric and periureteric stranding, suggestive of ascending urinary tract infection. 3. Similar appearance of the fluid and gas collection in the presacral/mesorectal space with possible fistulous connection with the urinary bladder. Consider further evaluation with nonemergent CT cystogram. 4. New patchy bilateral ground-glass opacities, which are nonspecific but favored to reflect an infectious or inflammatory etiology. 5. Similar surgical changes of left lower quadrant end colostomy with large peristomal hernia containing fat and nonobstructed loops of bowel. 6. Indexed hepatic lesions are similar to slightly increased in size in comparison to prior, but evaluation is limited by lack of intravenous contrast material, attention on follow-up contrasted examination suggested. 7. Increased size of the lingular pulmonary nodule at least  somewhat enlarged secondary to new associated atelectasis, attention on follow-up imaging suggested. Stable right upper lobe pulmonary nodule. 8. Aortic Atherosclerosis (ICD10-I70.0) and Emphysema  Follow-up CT cystogram revealed no evidence of vesicle -intestinal fistula.  Following discussions with Dr. Kathlene Cote patient was deemed an appropriate candidate for hepatic Y90 radioembolization and presents today for visceral/hepatic arterial roadmapping study with possible embolization/test Y-90 dosing.Risks and benefits of procedure were discussed with the patient /daughter including, but not limited to bleeding, infection, vascular injury or contrast induced renal failure.  This interventional procedure involves the use of X-rays and because of the nature of the planned procedure, it is possible that we will have prolonged use of X-ray fluoroscopy.  Potential radiation risks to you include (but are not limited to) the following: - A slightly elevated risk for cancer  several years later in life. This risk is typically less than 0.5% percent. This risk is low in comparison to the normal incidence of human cancer, which is 33% for women and 50% for men according to the [employer]. - Radiation induced injury can include skin redness, resembling a rash, tissue breakdown / ulcers and hair loss (which can be temporary or permanent).   The likelihood of either of these occurring depends on the difficulty of the procedure and whether you are sensitive to radiation due to previous procedures, disease, or genetic conditions.   IF your procedure requires a prolonged use of radiation, you will be notified and given written instructions for further action.  It is your responsibility to monitor the irradiated area for the 2 weeks following the procedure and to notify your physician if you are concerned that you have suffered a radiation induced injury.    All of the patient's questions were  answered, patient is agreeable to proceed.  Consent signed and in chart.      Electronically Signed: D. Rowe Robert, PA-C 08/31/2022, 2:33 PM   I spent a total of 25 minutes at the the patient's bedside AND on the patient's hospital floor or unit, greater than 50% of which was counseling/coordinating care for visceral/hepatic arteriogram  with possible embolization/test Y-90 dosing

## 2022-09-01 ENCOUNTER — Other Ambulatory Visit: Payer: Self-pay | Admitting: Interventional Radiology

## 2022-09-01 ENCOUNTER — Other Ambulatory Visit (HOSPITAL_COMMUNITY): Payer: Medicaid Other

## 2022-09-01 ENCOUNTER — Ambulatory Visit (HOSPITAL_COMMUNITY)
Admit: 2022-09-01 | Discharge: 2022-09-01 | Disposition: A | Discharge status: Home / Self Care | Payer: Medicaid Other | Source: Ambulatory Visit | Attending: Interventional Radiology | Admitting: Interventional Radiology

## 2022-09-01 ENCOUNTER — Ambulatory Visit (HOSPITAL_COMMUNITY)
Admit: 2022-09-01 | Discharge: 2022-09-01 | Disposition: A | Discharge status: Home / Self Care | Payer: Medicaid Other | Attending: Interventional Radiology | Admitting: Interventional Radiology

## 2022-09-01 ENCOUNTER — Ambulatory Visit (HOSPITAL_COMMUNITY): Payer: Medicaid Other

## 2022-09-01 ENCOUNTER — Other Ambulatory Visit: Payer: Self-pay | Admitting: Radiation Therapy

## 2022-09-01 ENCOUNTER — Encounter (HOSPITAL_COMMUNITY): Payer: Self-pay

## 2022-09-01 ENCOUNTER — Inpatient Hospital Stay (HOSPITAL_COMMUNITY)
Admission: RE | Admit: 2022-09-01 | Discharge: 2022-09-01 | Disposition: A | Discharge status: Home / Self Care | Payer: Medicaid Other | Source: Ambulatory Visit | Attending: Interventional Radiology | Admitting: Interventional Radiology

## 2022-09-01 DIAGNOSIS — N3090 Cystitis, unspecified without hematuria: Secondary | ICD-10-CM | POA: Insufficient documentation

## 2022-09-01 DIAGNOSIS — C787 Secondary malignant neoplasm of liver and intrahepatic bile duct: Secondary | ICD-10-CM

## 2022-09-01 DIAGNOSIS — J439 Emphysema, unspecified: Secondary | ICD-10-CM | POA: Insufficient documentation

## 2022-09-01 DIAGNOSIS — I700 Atherosclerosis of aorta: Secondary | ICD-10-CM | POA: Diagnosis not present

## 2022-09-01 DIAGNOSIS — Z933 Colostomy status: Secondary | ICD-10-CM | POA: Diagnosis not present

## 2022-09-01 DIAGNOSIS — C20 Malignant neoplasm of rectum: Secondary | ICD-10-CM | POA: Diagnosis not present

## 2022-09-01 DIAGNOSIS — J9811 Atelectasis: Secondary | ICD-10-CM | POA: Diagnosis not present

## 2022-09-01 DIAGNOSIS — C19 Malignant neoplasm of rectosigmoid junction: Secondary | ICD-10-CM

## 2022-09-01 DIAGNOSIS — Z85048 Personal history of other malignant neoplasm of rectum, rectosigmoid junction, and anus: Secondary | ICD-10-CM | POA: Diagnosis not present

## 2022-09-01 DIAGNOSIS — C7931 Secondary malignant neoplasm of brain: Secondary | ICD-10-CM

## 2022-09-01 DIAGNOSIS — Z923 Personal history of irradiation: Secondary | ICD-10-CM | POA: Diagnosis not present

## 2022-09-01 DIAGNOSIS — B9689 Other specified bacterial agents as the cause of diseases classified elsewhere: Secondary | ICD-10-CM | POA: Insufficient documentation

## 2022-09-01 HISTORY — PX: IR ANGIOGRAM VISCERAL SELECTIVE: IMG657

## 2022-09-01 HISTORY — PX: IR US GUIDE VASC ACCESS RIGHT: IMG2390

## 2022-09-01 HISTORY — PX: IR ANGIOGRAM SELECTIVE EACH ADDITIONAL VESSEL: IMG667

## 2022-09-01 LAB — CBC WITH DIFFERENTIAL/PLATELET
Abs Immature Granulocytes: 0.19 10*3/uL — ABNORMAL HIGH (ref 0.00–0.07)
Basophils Absolute: 0 10*3/uL (ref 0.0–0.1)
Basophils Relative: 0 %
Eosinophils Absolute: 0 10*3/uL (ref 0.0–0.5)
Eosinophils Relative: 0 %
HCT: 30.8 % — ABNORMAL LOW (ref 39.0–52.0)
Hemoglobin: 9.6 g/dL — ABNORMAL LOW (ref 13.0–17.0)
Immature Granulocytes: 2 %
Lymphocytes Relative: 12 %
Lymphs Abs: 1.2 10*3/uL (ref 0.7–4.0)
MCH: 30 pg (ref 26.0–34.0)
MCHC: 31.2 g/dL (ref 30.0–36.0)
MCV: 96.3 fL (ref 80.0–100.0)
Monocytes Absolute: 0.9 10*3/uL (ref 0.1–1.0)
Monocytes Relative: 9 %
Neutro Abs: 8.1 10*3/uL — ABNORMAL HIGH (ref 1.7–7.7)
Neutrophils Relative %: 77 %
Platelets: 148 10*3/uL — ABNORMAL LOW (ref 150–400)
RBC: 3.2 MIL/uL — ABNORMAL LOW (ref 4.22–5.81)
RDW: 23.3 % — ABNORMAL HIGH (ref 11.5–15.5)
WBC: 10.4 10*3/uL (ref 4.0–10.5)
nRBC: 0 % (ref 0.0–0.2)

## 2022-09-01 LAB — PROTIME-INR
INR: 1.1 (ref 0.8–1.2)
Prothrombin Time: 14.2 seconds (ref 11.4–15.2)

## 2022-09-01 LAB — COMPREHENSIVE METABOLIC PANEL WITH GFR
ALT: 45 U/L — ABNORMAL HIGH (ref 0–44)
AST: 20 U/L (ref 15–41)
Albumin: 2.8 g/dL — ABNORMAL LOW (ref 3.5–5.0)
Alkaline Phosphatase: 238 U/L — ABNORMAL HIGH (ref 38–126)
Anion gap: 9 (ref 5–15)
BUN: 23 mg/dL — ABNORMAL HIGH (ref 6–20)
CO2: 24 mmol/L (ref 22–32)
Calcium: 8.7 mg/dL — ABNORMAL LOW (ref 8.9–10.3)
Chloride: 99 mmol/L (ref 98–111)
Creatinine, Ser: 0.47 mg/dL — ABNORMAL LOW (ref 0.61–1.24)
GFR, Estimated: >60 mL/min
Glucose, Bld: 154 mg/dL — ABNORMAL HIGH (ref 70–99)
Potassium: 4.2 mmol/L (ref 3.5–5.1)
Sodium: 132 mmol/L — ABNORMAL LOW (ref 135–145)
Total Bilirubin: 1.5 mg/dL — ABNORMAL HIGH (ref 0.3–1.2)
Total Protein: 6.1 g/dL — ABNORMAL LOW (ref 6.5–8.1)

## 2022-09-01 MED ORDER — HEPARIN SOD (PORK) LOCK FLUSH 100 UNIT/ML IV SOLN
500.0000 [IU] | INTRAVENOUS | Status: AC | PRN
  Administered 2022-09-01: 500 [IU]
  Filled 2022-09-01: qty 5

## 2022-09-01 MED ORDER — MIDAZOLAM HCL 2 MG/2ML IJ SOLN
INTRAMUSCULAR | Status: AC | PRN
  Administered 2022-09-01: .5 mg via INTRAVENOUS

## 2022-09-01 MED ORDER — FENTANYL CITRATE (PF) 100 MCG/2ML IJ SOLN
INTRAMUSCULAR | Status: AC | PRN
  Administered 2022-09-01: 25 ug via INTRAVENOUS

## 2022-09-01 MED ORDER — SODIUM CHLORIDE 0.9 % IV SOLN: INTRAVENOUS | Status: DC

## 2022-09-01 MED ORDER — LIDOCAINE HCL 1 % IJ SOLN
INTRAMUSCULAR | Status: AC
  Filled 2022-09-01: qty 20

## 2022-09-01 MED ORDER — IOHEXOL 300 MG/ML  SOLN
100.0000 mL | Freq: Once | INTRAMUSCULAR | Status: AC | PRN
  Administered 2022-09-01: 15 mL via INTRA_ARTERIAL

## 2022-09-01 MED ORDER — FENTANYL CITRATE (PF) 100 MCG/2ML IJ SOLN
INTRAMUSCULAR | Status: AC
  Filled 2022-09-01: qty 2

## 2022-09-01 MED ORDER — MIDAZOLAM HCL 2 MG/2ML IJ SOLN
INTRAMUSCULAR | Status: AC
  Filled 2022-09-01: qty 2

## 2022-09-01 MED ORDER — IOHEXOL 300 MG/ML  SOLN
100.0000 mL | Freq: Once | INTRAMUSCULAR | Status: AC | PRN
  Administered 2022-09-01: 10 mL via INTRA_ARTERIAL

## 2022-09-01 MED ORDER — TECHNETIUM TO 99M ALBUMIN AGGREGATED
4.3900 | Freq: Once | INTRAVENOUS | Status: AC | PRN
  Administered 2022-09-01: 4.39 via INTRAVENOUS

## 2022-09-01 NOTE — Procedures (Signed)
Interventional Radiology Procedure Note  Procedure: Celiac, proper hepatic and right hepatic arteriography. MAA injection into right hepatic artery.  Complications: None  Estimated Blood Loss: < 10 mL  Findings: Normal celiac branching anatomy. Large right lobe hepatic hypovascular defects due to known metastatic lesions. Supply appears to be from right hepatic artery branches with no significant supply from left hepatic artery. MAA injected into right hepatic artery for lung shunt fraction calculation and NM imaging to follow.  Right CFA access closed with AngioSeal.  Mayelin Panos T. Kathlene Cote, M.D Pager:  [phone]

## 2022-09-01 NOTE — Progress Notes (Signed)
Order entered to access port prior to brain MRI in May.

## 2022-09-04 ENCOUNTER — Telehealth: Payer: Medicaid Other | Admitting: Physician Assistant

## 2022-09-04 DIAGNOSIS — J208 Acute bronchitis due to other specified organisms: Secondary | ICD-10-CM | POA: Diagnosis not present

## 2022-09-04 DIAGNOSIS — B9689 Other specified bacterial agents as the cause of diseases classified elsewhere: Secondary | ICD-10-CM | POA: Diagnosis not present

## 2022-09-04 MED ORDER — BENZONATATE 100 MG PO CAPS: 100.0000 mg | ORAL_CAPSULE | Freq: Three times a day (TID) | ORAL | 0 refills | Status: DC | PRN

## 2022-09-04 MED ORDER — DOXYCYCLINE HYCLATE 100 MG PO TABS: 100.0000 mg | ORAL_TABLET | Freq: Two times a day (BID) | ORAL | 0 refills | Status: DC

## 2022-09-04 NOTE — Progress Notes (Signed)

## 2022-09-07 ENCOUNTER — Encounter: Payer: Medicaid Other | Admitting: Urology

## 2022-09-07 ENCOUNTER — Ambulatory Visit
Admission: RE | Admit: 2022-09-07 | Discharge: 2022-09-07 | Disposition: A | Discharge status: Home / Self Care | Payer: Medicaid Other | Source: Ambulatory Visit | Attending: Hematology | Admitting: Hematology

## 2022-09-07 DIAGNOSIS — C7931 Secondary malignant neoplasm of brain: Secondary | ICD-10-CM | POA: Insufficient documentation

## 2022-09-07 NOTE — Progress Notes (Signed)
  Radiation Oncology         [phone] ________________________________  Name: James Coffey MRN: [id number]  Date of Service: 09/07/2022  DOB: [birth year]  Post Treatment Telephone Note  Diagnosis:  STage IV adenocarcinoma of the rectum.  Intent: Palliative  Radiation Treatment Dates:  07/31/2022 through 07/31/2022 [facility] Treatment Site Technique Total Dose (Gy) Dose per Fx (Gy) Completed Fx Beam Energies  Brain: Brain IMRT 15/15 15 1/1 6XFFF   (as documented in provider EOT note)   The patient was not available for call today. Voicemail left.  The patient was counseled that he  will be contacted by our brain and spine navigator to schedule surveillance imaging. The patient was encouraged to call if he  have not received a call to schedule imaging, or if he develop concerns or questions regarding radiation. The patient will also continue to follow up with Dr. Irene Limbo as well as Dr. Mickeal Skinner in the brain oncology program.   Leandra Kern, LPN

## 2022-09-08 ENCOUNTER — Encounter: Payer: Self-pay | Admitting: Hematology

## 2022-09-09 ENCOUNTER — Encounter: Payer: Self-pay | Admitting: Hematology

## 2022-09-09 ENCOUNTER — Other Ambulatory Visit: Payer: Self-pay | Admitting: Physician Assistant

## 2022-09-09 DIAGNOSIS — B370 Candidal stomatitis: Secondary | ICD-10-CM

## 2022-09-10 ENCOUNTER — Other Ambulatory Visit (INDEPENDENT_AMBULATORY_CARE_PROVIDER_SITE_OTHER): Payer: Self-pay | Admitting: Physician Assistant

## 2022-09-10 DIAGNOSIS — B370 Candidal stomatitis: Secondary | ICD-10-CM

## 2022-09-11 ENCOUNTER — Other Ambulatory Visit: Payer: Medicaid Other

## 2022-09-11 ENCOUNTER — Telehealth: Payer: Medicaid Other | Admitting: Nurse Practitioner

## 2022-09-11 DIAGNOSIS — M62830 Muscle spasm of back: Secondary | ICD-10-CM | POA: Diagnosis not present

## 2022-09-11 DIAGNOSIS — Z515 Encounter for palliative care: Secondary | ICD-10-CM

## 2022-09-11 MED ORDER — BACLOFEN 10 MG PO TABS
10.0000 mg | ORAL_TABLET | Freq: Three times a day (TID) | ORAL | 0 refills | Status: DC
Start: 2022-09-11 — End: 2022-10-01

## 2022-09-11 NOTE — Progress Notes (Signed)
We are sorry that you are not feeling well.  Here is how we plan to help!  Based on what you have shared with me it looks like you mostly have acute back pain.  Acute back pain is defined as musculoskeletal pain that can resolve in 1-3 weeks with conservative treatment.  I have prescribed Baclofen 10 mg every eight hours as needed which is a muscle relaxer   Please keep in mind that muscle relaxer's can cause fatigue and should not be taken while at work or driving.  Back pain is very common.  The pain often gets better over time.  The cause of back pain is usually not dangerous.  Most people can learn to manage their back pain on their own.  Home Care Stay active.  Start with short walks on flat ground if you can.  Try to walk farther each day. Do not sit, drive or stand in one place for more than 30 minutes.  Do not stay in bed. Do not avoid exercise or work.  Activity can help your back heal faster. Be careful when you bend or lift an object.  Bend at your knees, keep the object close to you, and do not twist. Sleep on a firm mattress.  Lie on your side, and bend your knees.  If you lie on your back, put a pillow under your knees. Only take medicines as told by your doctor. Put ice on the injured area. Put ice in a plastic bag Place a towel between your skin and the bag Leave the ice on for 15-20 minutes, 3-4 times a day for the first 2-3 days. 210 After that, you can switch between ice and heat packs. Ask your doctor about back exercises or massage. Avoid feeling anxious or stressed.  Find good ways to deal with stress, such as exercise.  Get Help Right Way If: Your pain does not go away with rest or medicine. Your pain does not go away in 1 week. You have new problems. You do not feel well. The pain spreads into your legs. You cannot control when you poop (bowel movement) or pee (urinate) You feel sick to your stomach (nauseous) or throw up (vomit) You have belly (abdominal)  pain. You feel like you may pass out (faint). If you develop a fever.  Make Sure you: Understand these instructions. Will watch your condition Will get help right away if you are not doing well or get worse.  Your e-visit answers were reviewed by a board certified advanced clinical practitioner to complete your personal care plan.  Depending on the condition, your plan could have included both over the counter or prescription medications.  If there is a problem please reply  once you have received a response from your provider.  Your safety is important to [place].  If you have drug allergies check your prescription carefully.    You can use MyChart to ask questions about today's visit, request a non-urgent call back, or ask for a work or school excuse for 24 hours related to this e-Visit. If it has been greater than 24 hours you will need to follow up with your provider, or enter a new e-Visit to address those concerns.  You will get an e-mail in the next two days asking about your experience.  I hope that your e-visit has been valuable and will speed your recovery. Thank you for using e-visits.   Meds ordered this encounter  Medications   baclofen (LIORESAL) 10  MG tablet    Sig: Take 1 tablet (10 mg total) by mouth 3 (three) times daily.    Dispense:  30 each    Refill:  0    I spent approximately 5 minutes reviewing the patient's history, current symptoms and coordinating their care today.

## 2022-09-15 ENCOUNTER — Other Ambulatory Visit: Payer: Self-pay | Admitting: Radiology

## 2022-09-15 DIAGNOSIS — C787 Secondary malignant neoplasm of liver and intrahepatic bile duct: Secondary | ICD-10-CM

## 2022-09-16 ENCOUNTER — Encounter (HOSPITAL_COMMUNITY): Payer: Self-pay

## 2022-09-16 ENCOUNTER — Ambulatory Visit (HOSPITAL_COMMUNITY)
Admit: 2022-09-16 | Discharge: 2022-09-16 | Disposition: A | Discharge status: Home / Self Care | Payer: Medicaid Other | Attending: Interventional Radiology | Admitting: Interventional Radiology

## 2022-09-16 ENCOUNTER — Other Ambulatory Visit: Payer: Self-pay | Admitting: Interventional Radiology

## 2022-09-16 ENCOUNTER — Ambulatory Visit (HOSPITAL_COMMUNITY)
Admission: RE | Admit: 2022-09-16 | Discharge: 2022-09-16 | Disposition: A | Discharge status: Home / Self Care | Payer: Medicaid Other | Source: Ambulatory Visit | Attending: Interventional Radiology | Admitting: Interventional Radiology

## 2022-09-16 ENCOUNTER — Other Ambulatory Visit: Payer: Self-pay

## 2022-09-16 DIAGNOSIS — C19 Malignant neoplasm of rectosigmoid junction: Secondary | ICD-10-CM

## 2022-09-16 DIAGNOSIS — C787 Secondary malignant neoplasm of liver and intrahepatic bile duct: Secondary | ICD-10-CM

## 2022-09-16 HISTORY — PX: IR US GUIDE VASC ACCESS RIGHT: IMG2390

## 2022-09-16 HISTORY — PX: IR ANGIOGRAM VISCERAL SELECTIVE: IMG657

## 2022-09-16 HISTORY — PX: IR EMBO TUMOR ORGAN ISCHEMIA INFARCT INC GUIDE ROADMAPPING: IMG5449

## 2022-09-16 HISTORY — PX: IR ANGIOGRAM SELECTIVE EACH ADDITIONAL VESSEL: IMG667

## 2022-09-16 LAB — COMPREHENSIVE METABOLIC PANEL
ALT: 24 U/L (ref 0–44)
AST: 35 U/L (ref 15–41)
Albumin: 2.3 g/dL — ABNORMAL LOW (ref 3.5–5.0)
Alkaline Phosphatase: 188 U/L — ABNORMAL HIGH (ref 38–126)
Anion gap: 16 — ABNORMAL HIGH (ref 5–15)
BUN: 28 mg/dL — ABNORMAL HIGH (ref 6–20)
CO2: 21 mmol/L — ABNORMAL LOW (ref 22–32)
Calcium: 8.7 mg/dL — ABNORMAL LOW (ref 8.9–10.3)
Chloride: 100 mmol/L (ref 98–111)
Creatinine, Ser: 0.96 mg/dL (ref 0.61–1.24)
GFR, Estimated: >60 mL/min (ref >60)
Glucose, Bld: 215 mg/dL — ABNORMAL HIGH (ref 70–99)
Potassium: 3.4 mmol/L — ABNORMAL LOW (ref 3.5–5.1)
Sodium: 137 mmol/L (ref 135–145)
Total Bilirubin: 1.3 mg/dL — ABNORMAL HIGH (ref 0.3–1.2)
Total Protein: 6.5 g/dL (ref 6.5–8.1)

## 2022-09-16 LAB — CBC
HCT: 27.5 % — ABNORMAL LOW (ref 39.0–52.0)
Hemoglobin: 8.5 g/dL — ABNORMAL LOW (ref 13.0–17.0)
MCH: 30.7 pg (ref 26.0–34.0)
MCHC: 30.9 g/dL (ref 30.0–36.0)
MCV: 99.3 fL (ref 80.0–100.0)
Platelets: 187 10*3/uL (ref 150–400)
RBC: 2.77 MIL/uL — ABNORMAL LOW (ref 4.22–5.81)
RDW: 20 % — ABNORMAL HIGH (ref 11.5–15.5)
WBC: 6.6 10*3/uL (ref 4.0–10.5)
nRBC: 2 % — ABNORMAL HIGH (ref 0.0–0.2)

## 2022-09-16 LAB — PROTIME-INR
INR: 1.3 — ABNORMAL HIGH (ref 0.8–1.2)
Prothrombin Time: 16.3 seconds — ABNORMAL HIGH (ref 11.4–15.2)

## 2022-09-16 LAB — APTT: aPTT: 31 seconds (ref 24–36)

## 2022-09-16 MED ORDER — HEPARIN SOD (PORK) LOCK FLUSH 100 UNIT/ML IV SOLN
500.0000 [IU] | Freq: Once | INTRAVENOUS | Status: AC
  Administered 2022-09-16: 500 [IU] via INTRAVENOUS
  Filled 2022-09-16: qty 5

## 2022-09-16 MED ORDER — LIDOCAINE HCL 1 % IJ SOLN: 5.0000 mL | Freq: Once | INTRAMUSCULAR | Status: DC

## 2022-09-16 MED ORDER — MIDAZOLAM HCL 2 MG/2ML IJ SOLN
INTRAMUSCULAR | Status: AC
  Filled 2022-09-16: qty 4

## 2022-09-16 MED ORDER — FENTANYL CITRATE (PF) 100 MCG/2ML IJ SOLN
INTRAMUSCULAR | Status: AC | PRN
  Administered 2022-09-16: 50 ug via INTRAVENOUS

## 2022-09-16 MED ORDER — ONDANSETRON HCL 4 MG/2ML IJ SOLN
4.0000 mg | Freq: Once | INTRAMUSCULAR | Status: AC
  Administered 2022-09-16: 4 mg via INTRAVENOUS

## 2022-09-16 MED ORDER — MIDAZOLAM HCL 2 MG/2ML IJ SOLN
INTRAMUSCULAR | Status: AC | PRN
  Administered 2022-09-16: 1 mg via INTRAVENOUS

## 2022-09-16 MED ORDER — ONDANSETRON HCL 4 MG/2ML IJ SOLN
8.0000 mg | Freq: Once | INTRAMUSCULAR | Status: DC
  Filled 2022-09-16: qty 4

## 2022-09-16 MED ORDER — LIDOCAINE HCL 1 % IJ SOLN
INTRAMUSCULAR | Status: AC
  Filled 2022-09-16: qty 20

## 2022-09-16 MED ORDER — YTTRIUM 90 INJECTION: 28.0000 | INJECTION | Freq: Once | INTRAVENOUS | Status: DC

## 2022-09-16 MED ORDER — SODIUM CHLORIDE 0.9 % IV SOLN: INTRAVENOUS | Status: DC

## 2022-09-16 MED ORDER — DEXAMETHASONE SODIUM PHOSPHATE 10 MG/ML IJ SOLN
INTRAMUSCULAR | Status: AC
  Filled 2022-09-16: qty 1

## 2022-09-16 MED ORDER — MIDODRINE HCL 5 MG PO TABS
10.0000 mg | ORAL_TABLET | Freq: Once | ORAL | Status: AC
  Administered 2022-09-16: 10 mg via ORAL
  Filled 2022-09-16: qty 2

## 2022-09-16 MED ORDER — PANTOPRAZOLE SODIUM 40 MG IV SOLR
40.0000 mg | Freq: Once | INTRAVENOUS | Status: AC
  Administered 2022-09-16: 40 mg via INTRAVENOUS

## 2022-09-16 MED ORDER — DEXAMETHASONE SODIUM PHOSPHATE 10 MG/ML IJ SOLN
8.0000 mg | Freq: Once | INTRAMUSCULAR | Status: AC
  Administered 2022-09-16: 8 mg via INTRAVENOUS

## 2022-09-16 MED ORDER — IOHEXOL 300 MG/ML  SOLN
50.0000 mL | Freq: Once | INTRAMUSCULAR | Status: AC | PRN
  Administered 2022-09-16: 20 mL via INTRA_ARTERIAL

## 2022-09-16 MED ORDER — SODIUM CHLORIDE 0.9 % IV SOLN
2.0000 g | Freq: Once | INTRAVENOUS | Status: AC
  Administered 2022-09-16: 2 g via INTRAVENOUS
  Filled 2022-09-16: qty 2

## 2022-09-16 MED ORDER — PANTOPRAZOLE SODIUM 40 MG IV SOLR
INTRAVENOUS | Status: AC
  Filled 2022-09-16: qty 10

## 2022-09-16 MED ORDER — FENTANYL CITRATE (PF) 100 MCG/2ML IJ SOLN
INTRAMUSCULAR | Status: AC
  Filled 2022-09-16: qty 2

## 2022-09-16 MED ORDER — IOHEXOL 300 MG/ML  SOLN
100.0000 mL | Freq: Once | INTRAMUSCULAR | Status: AC | PRN
  Administered 2022-09-16: 30 mL via INTRA_ARTERIAL

## 2022-09-16 NOTE — Progress Notes (Signed)
Dr. Mena Goes called. Discussed situation about pt complaining about his catheter. MD reviewed pt history and per MD, replace existing foley.

## 2022-09-16 NOTE — H&P (Signed)
Referring Physician(s): Kale,G  Supervising Physician: Irish Lack  Patient Status:  [facility] OP  Chief Complaint:  Metastatic colon cancer to liver  Subjective: James Coffey is a 52 y.o. 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 He is status post resection of a segment III left lobe metastatic lesion by Dr. Freida Busman on 08/28/2021.  He underwent AP resection of rectal carcinoma on 02/04/2022 by Dr. Maisie Fus with pathology demonstrating moderately differentiated invasive carcinoma with focally positive margin, lymphovascular invasion, perineural invasion and metastatic carcinoma in 5 of 10 lymph nodes. A postoperative presacral fluid collection was drained by percutaneous catheter drainage on 03/17/2022.The drain was removed on 04/28/2022.  CT on 04/28/2022 demonstrated 2 new metastatic lesions in the superior liver with a 3.5 x 2.9 cm lesion at the boundary of segments IV and V and a 2.7 x 2.1 cm lesion in the lateral dome of the right lobe in segment VIII.  Both lesions showed increase in size on 06/24/2022 with the larger measuring 6 x 5.8 cm and the more peripheral lesion 5.0 x 3.3 cm.  Follow-up CT on 07/13/2022 demonstrates stable size of lingular and right upper lobe pulmonary nodules after prior radiation therapy and increase in the liver lesions now measuring 7.6 x 6.1 cm and 5.2 x 3.5 cm.  Previously resected and ablated lesions have not recurred.  Latest CT chest abdomen pelvis performed on 08/10/2022 revealed:   1. 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 2. Mild bilateral hydroureteral nephrosis to the level of the urinary bladder with perinephric and periureteric stranding, suggestive of ascending urinary tract infection. 3.  Similar appearance of the fluid and gas collection in the presacral/mesorectal space with possible fistulous connection with the urinary bladder. Consider further evaluation with nonemergent CT cystogram. 4. New patchy bilateral ground-glass opacities, which are nonspecific but favored to reflect an infectious or inflammatory etiology. 5. Similar surgical changes of left lower quadrant end colostomy with large peristomal hernia containing fat and nonobstructed loops of bowel. 6. Indexed hepatic lesions are similar to slightly increased in size in comparison to prior, but evaluation is limited by lack of intravenous contrast material, attention on follow-up contrasted examination suggested. 7. Increased size of the lingular pulmonary nodule at least somewhat enlarged secondary to new associated atelectasis, attention on follow-up imaging suggested. Stable right upper lobe pulmonary nodule. 8. Aortic Atherosclerosis (ICD10-I70.0) and Emphysema   Follow-up CT cystogram revealed no evidence of vesicle -intestinal fistula.   Following discussions with Dr. Fredia Sorrow patient was deemed an appropriate candidate for hepatic Y90 radioembolization and presents today for right hepatic Y-90 radioembolization. He denies fever,HA,worsening dyspnea, back pain, or bleeding. He does have some CP assoc with cough , intermittent epigastric pain, recent N/V. He also complains of some leaking around foley .   Allergies: Hydrocodone and Pork-derived products  Medications: Prior to Admission medications   Medication Sig Start Date End Date Taking? Authorizing Provider  acetaminophen (TYLENOL) 500 MG tablet Take 2 tablets (1,000 mg total) by mouth every 8 (eight) hours as needed for mild pain or fever. 12/19/20   Juliet Rude, PA-C  baclofen (LIORESAL) 10 MG tablet Take 1 tablet (10 mg total) by mouth 3 (three) times daily. 09/11/22   Viviano Simas, FNP  benzonatate (TESSALON) 100 MG  capsule Take 1 capsule  (100 mg total) by mouth 3 (three) times daily as needed. 09/04/22   Margaretann LovelessBurnette, Jennifer M, PA-C  cyanocobalamin (VITAMIN B12) 1000 MCG tablet Take 1 tablet (1,000 mcg total) by mouth daily. 07/25/22   Albertine GratesXu, Fang, MD  dexamethasone (DECADRON) 2 MG tablet Take 1 tablet (2 mg total) by mouth daily. 08/19/22   Leroy SeaSingh, Prashant K, MD  doxycycline (VIBRA-TABS) 100 MG tablet Take 1 tablet (100 mg total) by mouth 2 (two) times daily. 09/04/22   Margaretann LovelessBurnette, Jennifer M, PA-C  levETIRAcetam (KEPPRA) 500 MG tablet Take 1 tablet (500 mg total) by mouth 2 (two) times daily. 08/19/22   Leroy SeaSingh, Prashant K, MD  LORazepam (ATIVAN) 0.5 MG tablet Take 1 tablet (0.5 mg total) by mouth at bedtime. Patient taking differently: Take 0.5 mg by mouth 3 (three) times daily as needed for anxiety or sleep. 07/20/22   Johney MaineKale, Gautam Kishore, MD  midodrine (PROAMATINE) 10 MG tablet Take 1 tablet (10 mg total) by mouth 3 (three) times daily with meals. 08/19/22   Leroy SeaSingh, Prashant K, MD  nystatin (MYCOSTATIN) 100000 UNIT/ML suspension Take 5 mLs (500,000 Units total) by mouth 4 (four) times daily. 08/26/22   Margaretann LovelessBurnette, Jennifer M, PA-C  oxyCODONE (OXY IR/ROXICODONE) 5 MG immediate release tablet Take 1 tablet (5 mg total) by mouth 3 (three) times daily as needed for moderate pain or severe pain. 08/19/22   Leroy SeaSingh, Prashant K, MD  pantoprazole (PROTONIX) 40 MG tablet Take 1 tablet (40 mg total) by mouth daily. 07/25/22 08/24/22  Albertine GratesXu, Fang, MD  polyethylene glycol (MIRALAX) 17 g packet Take 17 g by mouth daily. Take daily to maintain regular soft colostomy output. Can increase to twice a day if need. Patient taking differently: Take 17 g by mouth daily as needed for mild constipation. 06/24/22   Eric FormKabrich, Martha H, PA-C  prochlorperazine (COMPAZINE) 10 MG tablet Take 1 tablet (10 mg total) by mouth every 6 (six) hours as needed for nausea or vomiting. Patient taking differently: Take 10 mg by mouth 3 (three) times daily as needed for nausea or vomiting. 07/20/22    Johney MaineKale, Gautam Kishore, MD  tamsulosin (FLOMAX) 0.4 MG CAPS capsule Take 1 capsule (0.4 mg total) by mouth daily after supper. 07/25/22   Albertine GratesXu, Fang, MD     Vital Signs: BP (!) 85/70   Pulse 95   Temp 97.7 F (36.5 C) (Oral)   Resp 17   SpO2 98%    Code Status: FULL CODE  Physical Exam: awake/alert; chest- sl dim BS bases, no wheezing; clean, intact rt chest port a cath; heart- RRR; abd- soft,+BS, intact LLQ colostomy, mildly tender epigastric region; no LE edema, foley cath in place   Imaging: No results found.  Labs:  CBC: Recent Labs    08/17/22 0212 08/18/22 0340 08/18/22 2051 09/01/22 0800  WBC 7.8 8.3 8.0 10.4  HGB 7.6* 8.0* 7.9* 9.6*  HCT 24.5* 25.5* 26.2* 30.8*  PLT 69* 84* 94* 148*    COAGS: Recent Labs    03/17/22 0438 08/10/22 0809 08/18/22 2051 09/01/22 0800  INR 1.4* 1.6* 1.2 1.1    BMP: Recent Labs    08/16/22 0333 08/17/22 0212 08/18/22 0340 09/01/22 0800  NA 141 136 139 132*  K 4.0 3.6 3.5 4.2  CL 108 104 109 99  CO2 27 27 21* 24  GLUCOSE 96 135* 74 154*  BUN 23* 20 17 23*  CALCIUM 8.3* 7.9* 7.8* 8.7*  CREATININE 0.74 0.70 0.64 0.47*  GFRNONAA >  60 >60 >60 >60    LIVER FUNCTION TESTS: Recent Labs    08/16/22 0333 08/17/22 0212 08/18/22 0340 09/01/22 0800  BILITOT 2.9* 2.8* 2.2* 1.5*  AST 32 30 27 20   ALT 120* 89* 72* 45*  ALKPHOS 165* 241* 246* 238*  PROT 4.6* 4.5* 4.6* 6.1*  ALBUMIN 2.0* 1.9* 1.9* 2.8*    Assessment and Plan: 52 y.o. 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 He is status post resection of a segment III left lobe metastatic lesion by Dr. Freida Busman on 08/28/2021.  He underwent AP resection of rectal carcinoma on 02/04/2022 by Dr. Maisie Fus with pathology demonstrating moderately differentiated invasive carcinoma with focally positive margin,  lymphovascular invasion, perineural invasion and metastatic carcinoma in 5 of 10 lymph nodes. He now has recurrent metastatic disease in right lobe of liver and following discussions with Dr. Fredia Sorrow was deemed an appropriate candidate for hepatic Y-90 treatment. He presents today for right hepatic Y-90 radioembolization. Risks and benefits of procedure were discussed with the patient via interpreter including, but not limited to bleeding, infection, vascular injury or contrast induced renal failure.  This interventional procedure involves the use of X-rays and because of the nature of the planned procedure, it is possible that we will have prolonged use of X-ray fluoroscopy.  Potential radiation risks to you include (but are not limited to) the following: - A slightly elevated risk for cancer  several years later in life. This risk is typically less than 0.5% percent. This risk is low in comparison to the normal incidence of human cancer, which is 33% for women and 50% for men according to the [employer] Society. - Radiation induced injury can include skin redness, resembling a rash, tissue breakdown / ulcers and hair loss (which can be temporary or permanent).   The likelihood of either of these occurring depends on the difficulty of the procedure and whether you are sensitive to radiation due to previous procedures, disease, or genetic conditions.   IF your procedure requires a prolonged use of radiation, you will be notified and given written instructions for further action.  It is your responsibility to monitor the irradiated area for the 2 weeks following the procedure and to notify your physician if you are concerned that you have suffered a radiation induced injury.    All of the patient's questions were answered, patient is agreeable to proceed.  Consent signed and in chart. Labs reviewed.      Electronically Signed: D. Jeananne Rama, PA-C 09/16/2022, 8:31 AM   I spent a  total of 20 minutes at the the patient's bedside AND on the patient's hospital floor or unit, greater than 50% of which was counseling/coordinating care for visceral/hepatic arteriogram with right hepatic Y-90 radioembolization

## 2022-09-16 NOTE — Progress Notes (Signed)
Discharge instruction reviewed and discussed with patient. Pt c/o of long term indwelling catheter leaking and refuses to be discharged until the leaking is stopped. Pt educated that today's visit is for Y-90 procedure and the plan was to get discharged after if no complications are present. Pt still adamant about fixing his leaking catheter. Pt's daughter [name] called to discuss this complaint and she states that this leak "has been going on for a while" and that she is working on getting an appointment with a urologist to assess his catheter. Informed Dr. Fredia Sorrow. Per MD, will put consult in for Urology.

## 2022-09-16 NOTE — Progress Notes (Signed)
Dr. Mena Goes at bedside and replaced pt's foley catheter. Pt tolerated well. No s/s of distress at this time.

## 2022-09-16 NOTE — Procedures (Signed)
Interventional Radiology Procedure Note  Procedure: Hepatic arteriography and right hepatic Y-90 radioembolization  Complications: None  Estimated Blood Loss: < 10 mL  Findings: Right hepatic artery Y-90 microsphere radioembolization performed with SIR-Spheres with delivery of 28 mCi dose.   NM imaging to follow.  Jodi Marble. Fredia Sorrow, M.D Pager:  [phone]

## 2022-09-16 NOTE — Discharge Instructions (Addendum)
Please call Interventional Radiology clinic [phone] with any questions or concerns.  You may remove your dressing and shower tomorrow.  Schedule appointment with [facility] @ [phone]    Hepatic Artery Radioembolization, Care After The following information offers guidance on how to care for yourself after your procedure. Your health care provider may also give you more specific instructions. If you have problems or questions, contact your health care provider. What can I expect after the procedure? After the procedure, it is possible to have: A slight fever for 7 to 10 days. This may be accompanied by pain, nausea, or vomiting, which is referred to as post-embolization syndrome. You may be given medicine to help relieve these symptoms. If your fever gets worse, tell your health care provider. Tiredness (fatigue). Loss of appetite. This should gradually improve after about 1 week. Abdominal pain on your right side. Soreness and tenderness in your groin area where the needle and catheter were placed (puncture site). Follow these instructions at home: Puncture site care Follow instructions from your health care provider about how to take care of the puncture site. Make sure you: Wash your hands with soap and water for at least 20 seconds before and after you change your bandage (dressing). If soap and water are not available, use hand sanitizer. Change your dressing as told by your health care provider. Check your puncture site every day for signs of infection. Check for: More redness, swelling, or pain. Fluid or blood. Warmth. Pus or a bad smell. Activity Rest as told by your health care provider. Return to your normal activities as told by your health care provider. Ask your health care provider what activities are safe for you. Avoid sitting for a long time without moving. Get up to take short walks every 1-2 hours. This is important to improve blood flow and breathing.  Ask for help if you feel weak or unsteady. If you were given a sedative during the procedure, it can affect you for several hours. Do not drive or operate machinery until your health care provider says that it is safe. Do not lift anything that is heavier than 10 lb (4.5 kg), or the limit that you are told, until your health care provider says that it is safe. Medicines Take over-the-counter and prescription medicines only as told by your health care provider. Ask your health care provider if the medicine prescribed to you: Requires you to avoid driving or using machinery. Can cause constipation. You may need to take these actions to prevent or treat constipation: Drink enough fluid to keep your urine pale yellow. Take over-the-counter or prescription medicines. Eat foods that are high in fiber, such as beans, whole grains, and fresh fruits and vegetables. Limit foods that are high in fat and processed sugars, such as fried or sweet foods. General instructions Eat frequent, small meals until your appetite returns. Follow instructions from your health care provider about eating or drinking restrictions. Do not take baths, swim, or use a hot tub until your health care provider approves. You may take showers. Wash your puncture site with mild soap and water, and pat the area dry. Wear compression stockings as told by your health care provider. These stockings help to prevent blood clots and reduce swelling in your legs. Keep all follow-up visits. This is important. You may need to have blood tests and imaging tests. Contact a health care provider if: You have any of these signs of infection: More redness, swelling, or pain around your  puncture site. Fluid or blood coming from your puncture site. Warmth coming from your puncture site. Pus or a bad smell coming from your puncture site. You have pain that: Gets worse. Does not get better with medicine. Feels like very bad heartburn. Is in the  middle of your abdomen, above your belly button. You have any signs of infection or liver failure, such as: Your skin or the white parts of your eyes turn yellow (jaundice). The color of your urine changes to dark brown. The color of your stool (feces) changes to light yellow. Your abdominal measurement (girth) increases in a short period of time. You gain more than 5 lb (2.3 kg) in a short period of time. Get help right away if: You have a fever that lasts more than 10 days or is higher than what your health care provider told you to expect. You develop any of the following in your legs: Pain. Swelling. Skin that is cold or pale or turns blue. You have chest pain. You have blood in your vomit, saliva, or stool. You have trouble breathing. These symptoms may represent a serious problem that is an emergency. Do not wait to see if the symptoms will go away. Get medical help right away. Call your local emergency services (911 in the [place] Do not drive yourself to the hospital. Summary After the procedure, it is possible to have a slight fever for up to 7-10 days, tiredness, loss of appetite, abdominal pain on the right side, and groin tenderness where the catheter was placed. Do not come in close contact with people for up to a week after your procedure, as told by your health care provider. Follow instructions from your health care provider about how to take care of the puncture site. Contact a health care provider if you have any signs of infection. Get help right away if you develop pain or swelling in your legs or if your legs feel cool or look pale. This information is not intended to replace advice given to you by your health care provider. Make sure you discuss any questions you have with your health care provider. Document Revised: 04/28/2020 Document Reviewed: 04/28/2020 Elsevier Patient Education  2022 Elsevier Inc.      Moderate Conscious Sedation, Adult, Care After This  sheet gives you information about how to care for yourself after your procedure. Your health care provider may also give you more specific instructions. If you have problems or questions, contact your health care provider. What can I expect after the procedure? After the procedure, it is common to have: Sleepiness for several hours. Impaired judgment for several hours. Difficulty with balance. Vomiting if you eat too soon. Follow these instructions at home: For the time period you were told by your health care provider: Rest. Do not participate in activities where you could fall or become injured. Do not drive or use machinery. Do not drink alcohol. Do not take sleeping pills or medicines that cause drowsiness. Do not make important decisions or sign legal documents. Do not take care of children on your own.      Eating and drinking Follow the diet recommended by your health care provider. Drink enough fluid to keep your urine pale yellow. If you vomit: Drink water, juice, or soup when you can drink without vomiting. Make sure you have little or no nausea before eating solid foods.   General instructions Take over-the-counter and prescription medicines only as told by your health care provider. Have a  responsible adult stay with you for the time you are told. It is important to have someone help care for you until you are awake and alert. Do not smoke. Keep all follow-up visits as told by your health care provider. This is important. Contact a health care provider if: You are still sleepy or having trouble with balance after 24 hours. You feel light-headed. You keep feeling nauseous or you keep vomiting. You develop a rash. You have a fever. You have redness or swelling around the IV site. Get help right away if: You have trouble breathing. You have new-onset confusion at home. Summary After the procedure, it is common to feel sleepy, have impaired judgment, or feel nauseous  if you eat too soon. Rest after you get home. Know the things you should not do after the procedure. Follow the diet recommended by your health care provider and drink enough fluid to keep your urine pale yellow. Get help right away if you have trouble breathing or new-onset confusion at home. This information is not intended to replace advice given to you by your health care provider. Make sure you discuss any questions you have with your health care provider. Document Revised: 09/22/2019 Document Reviewed: 04/20/2019 Elsevier Patient Education  2021 Elsevier Inc.     RADIATION PRECAUTIONS:  For up to a week after your procedure, there will be a small amount of radioactivity near your liver. This is not especially dangerous to other people. However, as told by your health care provider, you should follow these precautions for 7 days: Do not come in close contact with people. Do not sleep in the same bed as someone else. Do not hold children or babies. Do not have contact with pregnant women.  Post Y-90 Radioembolization Discharge Instructions  You have been given a radioactive material during your procedure.  While it is safe for you to be discharged home from the hospital, you need to proceed directly home.    Do not use public transportation, including air travel, lasting more than 2 hours for 1 week.  Avoid crowded public places for 1 week.  Adult visitors should try to avoid close contact with you for 1 week.    Children and pregnant females should not visit or have close contact with you for 1 week.  Items that you touch are not radioactive.  Do not sleep in the same bed as your partner for 1 week, and a condom should be used for sexual activity during the first 24 hours.  Your blood may be radioactive and caution should be used if any bleeding occurs during the recovery period.  Body fluids may be radioactive for 24 hours.  Wash your hands after voiding.  Men should sit to  urinate.  Dispose of any soiled materials (flush down toilet or place in trash at home) during the first day.  Drink 6 to 8 glasses of fluids per day for 5 days to hydrate yourself.  If you need to see a doctor during the first week, you must let them know that you were treated with yttrium-90 microspheres, and will be slightly radioactive.  They can call Interventional Radiology [phone] with any questions.

## 2022-09-21 ENCOUNTER — Telehealth: Payer: Medicaid Other | Admitting: Physician Assistant

## 2022-09-21 DIAGNOSIS — R052 Subacute cough: Secondary | ICD-10-CM

## 2022-09-21 NOTE — Progress Notes (Signed)
Because of failing first-line antibiotics and being high risk for complications, I feel your condition warrants further evaluation and I recommend that you be seen in a face to face visit to be evaluated for pneumonia.   NOTE: There will be NO CHARGE for this eVisit   If you are having a true medical emergency please call 911.      For an urgent face to face visit, [facility] has eight urgent care centers for your convenience:   NEW!! [facility] Center at [facility] Get Driving Directions [phone] [address] [place], [address]    [facility] at [facility] Get Driving Directions [phone] [address] [place], [place] [address]   [facility]) Get Driving Directions [phone] [address] [place], [place] [address]  [facility] Health Urgent Care Center [facility]) Get Driving Directions [phone] [address] [place],  [place]  [address]  [facility] Care Center [facility] - at [employer]  [phone] [address] [place],  [place] [address]   [facility] Health Urgent Care at [facility] Get Driving Directions [phone] [address] [place], [place] [address]   [facility] Urgent Care at [facility] Get Driving Directions  [phone] [address] [place], [place] [address]   [facility] Health Urgent Care at [facility] Directions [phone] [address] [place], [place] [address]  Your MyChart E-visit questionnaire answers were reviewed by a board certified advanced clinical practitioner to complete your personal care plan based on your specific symptoms.  Thank you for using e-Visits.   I have spent 5 minutes in review of e-visit questionnaire, review and updating patient chart, medical decision making and response  to patient.   Margaretann Loveless, PA-C

## 2022-09-22 NOTE — Progress Notes (Signed)
PATIENT NAME: James Coffey DOB: [birth year] MRN: [id number]  PRIMARY CARE PROVIDER: Patient, No Pcp Per  RESPONSIBLE PARTY:  Acct ID - Guarantor Home Phone Work Phone Relationship Acct Type  [id number] [name] [phone]  Self P/F     [address], [place], [place] [address]    Telephone call made to this patient and his Daughter [name]. There was no answer. Left a message for a return call and currently awaiting a call back.   PHYSICAL EXAM:   VITALS:There were no vitals filed for this visit.         [name], LPN

## 2022-09-23 ENCOUNTER — Other Ambulatory Visit (HOSPITAL_COMMUNITY): Payer: Self-pay

## 2022-09-23 ENCOUNTER — Other Ambulatory Visit: Payer: Self-pay | Admitting: Hematology

## 2022-09-23 DIAGNOSIS — G4709 Other insomnia: Secondary | ICD-10-CM

## 2022-09-23 MED ORDER — LORAZEPAM 1 MG PO TABS
1.0000 mg | ORAL_TABLET | Freq: Every evening | ORAL | 0 refills | Status: DC | PRN
Start: 2022-09-23 — End: 2022-10-01
  Filled 2022-09-23: qty 30, 30d supply, fill #0

## 2022-09-25 ENCOUNTER — Other Ambulatory Visit (HOSPITAL_COMMUNITY): Payer: Self-pay

## 2022-09-27 ENCOUNTER — Inpatient Hospital Stay (HOSPITAL_COMMUNITY)
Admission: EM | Admit: 2022-09-27 | Discharge: 2022-10-07 | DRG: 871 | Disposition: E | Payer: Medicaid Other | Attending: Family Medicine | Admitting: Family Medicine

## 2022-09-27 ENCOUNTER — Emergency Department (HOSPITAL_COMMUNITY): Payer: Medicaid Other

## 2022-09-27 ENCOUNTER — Other Ambulatory Visit: Payer: Self-pay

## 2022-09-27 DIAGNOSIS — N322 Vesical fistula, not elsewhere classified: Secondary | ICD-10-CM

## 2022-09-27 DIAGNOSIS — Z85048 Personal history of other malignant neoplasm of rectum, rectosigmoid junction, and anus: Secondary | ICD-10-CM

## 2022-09-27 DIAGNOSIS — Z79899 Other long term (current) drug therapy: Secondary | ICD-10-CM

## 2022-09-27 DIAGNOSIS — Z9221 Personal history of antineoplastic chemotherapy: Secondary | ICD-10-CM

## 2022-09-27 DIAGNOSIS — N390 Urinary tract infection, site not specified: Secondary | ICD-10-CM | POA: Diagnosis present

## 2022-09-27 DIAGNOSIS — G9341 Metabolic encephalopathy: Secondary | ICD-10-CM | POA: Diagnosis present

## 2022-09-27 DIAGNOSIS — R001 Bradycardia, unspecified: Secondary | ICD-10-CM | POA: Diagnosis not present

## 2022-09-27 DIAGNOSIS — T83511A Infection and inflammatory reaction due to indwelling urethral catheter, initial encounter: Principal | ICD-10-CM | POA: Diagnosis present

## 2022-09-27 DIAGNOSIS — B9620 Unspecified Escherichia coli [E. coli] as the cause of diseases classified elsewhere: Secondary | ICD-10-CM | POA: Diagnosis present

## 2022-09-27 DIAGNOSIS — R739 Hyperglycemia, unspecified: Secondary | ICD-10-CM | POA: Diagnosis present

## 2022-09-27 DIAGNOSIS — F17210 Nicotine dependence, cigarettes, uncomplicated: Secondary | ICD-10-CM | POA: Diagnosis present

## 2022-09-27 DIAGNOSIS — D509 Iron deficiency anemia, unspecified: Secondary | ICD-10-CM | POA: Diagnosis present

## 2022-09-27 DIAGNOSIS — J181 Lobar pneumonia, unspecified organism: Secondary | ICD-10-CM | POA: Diagnosis present

## 2022-09-27 DIAGNOSIS — R6520 Severe sepsis without septic shock: Secondary | ICD-10-CM | POA: Diagnosis present

## 2022-09-27 DIAGNOSIS — Z91014 Allergy to mammalian meats: Secondary | ICD-10-CM

## 2022-09-27 DIAGNOSIS — Z66 Do not resuscitate: Secondary | ICD-10-CM | POA: Diagnosis present

## 2022-09-27 DIAGNOSIS — K435 Parastomal hernia without obstruction or  gangrene: Secondary | ICD-10-CM | POA: Diagnosis present

## 2022-09-27 DIAGNOSIS — R4182 Altered mental status, unspecified: Secondary | ICD-10-CM | POA: Diagnosis present

## 2022-09-27 DIAGNOSIS — Z885 Allergy status to narcotic agent status: Secondary | ICD-10-CM

## 2022-09-27 DIAGNOSIS — Z9049 Acquired absence of other specified parts of digestive tract: Secondary | ICD-10-CM

## 2022-09-27 DIAGNOSIS — I2489 Other forms of acute ischemic heart disease: Secondary | ICD-10-CM | POA: Diagnosis present

## 2022-09-27 DIAGNOSIS — C775 Secondary and unspecified malignant neoplasm of intrapelvic lymph nodes: Secondary | ICD-10-CM | POA: Diagnosis present

## 2022-09-27 DIAGNOSIS — K611 Rectal abscess: Secondary | ICD-10-CM | POA: Diagnosis present

## 2022-09-27 DIAGNOSIS — J189 Pneumonia, unspecified organism: Secondary | ICD-10-CM

## 2022-09-27 DIAGNOSIS — Z7401 Bed confinement status: Secondary | ICD-10-CM

## 2022-09-27 DIAGNOSIS — D696 Thrombocytopenia, unspecified: Secondary | ICD-10-CM | POA: Diagnosis present

## 2022-09-27 DIAGNOSIS — Z933 Colostomy status: Secondary | ICD-10-CM

## 2022-09-27 DIAGNOSIS — E440 Moderate protein-calorie malnutrition: Secondary | ICD-10-CM | POA: Diagnosis present

## 2022-09-27 DIAGNOSIS — K651 Peritoneal abscess: Secondary | ICD-10-CM

## 2022-09-27 DIAGNOSIS — J9601 Acute respiratory failure with hypoxia: Secondary | ICD-10-CM | POA: Diagnosis not present

## 2022-09-27 DIAGNOSIS — E875 Hyperkalemia: Secondary | ICD-10-CM | POA: Diagnosis present

## 2022-09-27 DIAGNOSIS — E872 Acidosis, unspecified: Secondary | ICD-10-CM | POA: Diagnosis present

## 2022-09-27 DIAGNOSIS — E876 Hypokalemia: Secondary | ICD-10-CM | POA: Diagnosis not present

## 2022-09-27 DIAGNOSIS — C20 Malignant neoplasm of rectum: Secondary | ICD-10-CM | POA: Diagnosis present

## 2022-09-27 DIAGNOSIS — A419 Sepsis, unspecified organism: Principal | ICD-10-CM | POA: Diagnosis present

## 2022-09-27 DIAGNOSIS — Y846 Urinary catheterization as the cause of abnormal reaction of the patient, or of later complication, without mention of misadventure at the time of the procedure: Secondary | ICD-10-CM | POA: Diagnosis present

## 2022-09-27 DIAGNOSIS — R17 Unspecified jaundice: Secondary | ICD-10-CM

## 2022-09-27 DIAGNOSIS — Z681 Body mass index (BMI) 19 or less, adult: Secondary | ICD-10-CM

## 2022-09-27 DIAGNOSIS — C7800 Secondary malignant neoplasm of unspecified lung: Secondary | ICD-10-CM | POA: Diagnosis present

## 2022-09-27 DIAGNOSIS — R54 Age-related physical debility: Secondary | ICD-10-CM | POA: Diagnosis present

## 2022-09-27 DIAGNOSIS — E871 Hypo-osmolality and hyponatremia: Secondary | ICD-10-CM | POA: Diagnosis present

## 2022-09-27 DIAGNOSIS — Z923 Personal history of irradiation: Secondary | ICD-10-CM

## 2022-09-27 DIAGNOSIS — C7931 Secondary malignant neoplasm of brain: Secondary | ICD-10-CM | POA: Diagnosis present

## 2022-09-27 DIAGNOSIS — Z515 Encounter for palliative care: Secondary | ICD-10-CM

## 2022-09-27 DIAGNOSIS — I4891 Unspecified atrial fibrillation: Secondary | ICD-10-CM | POA: Diagnosis not present

## 2022-09-27 DIAGNOSIS — I9589 Other hypotension: Secondary | ICD-10-CM | POA: Diagnosis present

## 2022-09-27 DIAGNOSIS — C787 Secondary malignant neoplasm of liver and intrahepatic bile duct: Secondary | ICD-10-CM | POA: Diagnosis present

## 2022-09-27 LAB — CBC WITH DIFFERENTIAL/PLATELET
Abs Immature Granulocytes: 0.37 10*3/uL — ABNORMAL HIGH (ref 0.00–0.07)
Basophils Absolute: 0.1 10*3/uL (ref 0.0–0.1)
Basophils Relative: 0 %
Eosinophils Absolute: 0 10*3/uL (ref 0.0–0.5)
Eosinophils Relative: 0 %
HCT: 41.9 % (ref 39.0–52.0)
Hemoglobin: 13 g/dL (ref 13.0–17.0)
Immature Granulocytes: 2 %
Lymphocytes Relative: 1 %
Lymphs Abs: 0.2 10*3/uL — ABNORMAL LOW (ref 0.7–4.0)
MCH: 31.4 pg (ref 26.0–34.0)
MCHC: 31 g/dL (ref 30.0–36.0)
MCV: 101.2 fL — ABNORMAL HIGH (ref 80.0–100.0)
Monocytes Absolute: 1.4 10*3/uL — ABNORMAL HIGH (ref 0.1–1.0)
Monocytes Relative: 6 %
Neutro Abs: 21.4 10*3/uL — ABNORMAL HIGH (ref 1.7–7.7)
Neutrophils Relative %: 91 %
Platelets: 323 10*3/uL (ref 150–400)
RBC: 4.14 MIL/uL — ABNORMAL LOW (ref 4.22–5.81)
RDW: 20.4 % — ABNORMAL HIGH (ref 11.5–15.5)
WBC: 23.4 10*3/uL — ABNORMAL HIGH (ref 4.0–10.5)
nRBC: 0.1 % (ref 0.0–0.2)

## 2022-09-27 LAB — COMPREHENSIVE METABOLIC PANEL
ALT: 40 U/L (ref 0–44)
AST: 51 U/L — ABNORMAL HIGH (ref 15–41)
Albumin: 2.8 g/dL — ABNORMAL LOW (ref 3.5–5.0)
Alkaline Phosphatase: 331 U/L — ABNORMAL HIGH (ref 38–126)
Anion gap: 17 — ABNORMAL HIGH (ref 5–15)
BUN: 32 mg/dL — ABNORMAL HIGH (ref 6–20)
CO2: 18 mmol/L — ABNORMAL LOW (ref 22–32)
Calcium: 9 mg/dL (ref 8.9–10.3)
Chloride: 95 mmol/L — ABNORMAL LOW (ref 98–111)
Creatinine, Ser: 0.77 mg/dL (ref 0.61–1.24)
GFR, Estimated: >60 mL/min (ref >60)
Glucose, Bld: 200 mg/dL — ABNORMAL HIGH (ref 70–99)
Potassium: 5.2 mmol/L — ABNORMAL HIGH (ref 3.5–5.1)
Sodium: 130 mmol/L — ABNORMAL LOW (ref 135–145)
Total Bilirubin: 3.2 mg/dL — ABNORMAL HIGH (ref 0.3–1.2)
Total Protein: 6.7 g/dL (ref 6.5–8.1)

## 2022-09-27 NOTE — ED Provider Notes (Signed)
[facility]ERGENCY DEPT [facility] Emergency Department Provider Note MRN:  [id number]  Arrival date & time: 09/28/22     Chief Complaint   catheter Issue   History of Present Illness   James Coffey is a 52 y.o. year-old male presents to the ED with chief complaint of confusion.  States that he woke up yelling and yanking his catheter and ostomy bag tonight.  Daughter states that this is very unusual.  Has been on ativan, daughter concerned for polypharmacy. Recent diagnosis of new tumor burden in brain.  Hx of colon cancer.  History provided by daughter, [name].   Review of Systems  Pertinent positive and negative review of systems noted in HPI.    Physical Exam   Vitals:   09/28/22 0245 09/28/22 0330  BP: (!) 117/91 (!) 127/91  Pulse: 77 75  Resp: 11 13  Temp:    SpO2: 95% 98%    CONSTITUTIONAL:  chronically ill-appearing, NAD NEURO:  Alert and oriented x 3, CN 3-12 grossly intact EYES:  eyes equal and reactive ENT/NECK:  Supple, no stridor  CARDIO:  normal rate, regular rhythm, appears well-perfused  PULM:  No respiratory distress, no wheezes GI/GU:  non-distended, ostomy in RLQ, foley catheter in place MSK/SPINE:  No gross deformities, no edema, moves all extremities  SKIN:  no rash, atraumatic   *Additional and/or pertinent findings included in MDM below  Diagnostic and Interventional Summary    EKG Interpretation  Date/Time:  Monday September 28 2022 00:21:20 EDT Ventricular Rate:  100 PR Interval:  115 QRS Duration: 97 QT Interval:  343 QTC Calculation: 443 R Axis:   14 Text Interpretation: Sinus tachycardia Biatrial enlargement Abnormal R-wave progression, early transition Repol abnrm, severe global ischemia (LM/MVD) Confirmed by Molpus, John ([id number]) on 09/28/2022 12:26:26 AM       Labs Reviewed  CBC WITH DIFFERENTIAL/PLATELET - Abnormal; Notable for the following components:      Result Value   WBC 23.4 (*)    RBC 4.14 (*)    MCV 101.2 (*)     RDW 20.4 (*)    Neutro Abs 21.4 (*)    Lymphs Abs 0.2 (*)    Monocytes Absolute 1.4 (*)    Abs Immature Granulocytes 0.37 (*)    All other components within normal limits  COMPREHENSIVE METABOLIC PANEL - Abnormal; Notable for the following components:   Sodium 130 (*)    Potassium 5.2 (*)    Chloride 95 (*)    CO2 18 (*)    Glucose, Bld 200 (*)    BUN 32 (*)    Albumin 2.8 (*)    AST 51 (*)    Alkaline Phosphatase 331 (*)    Total Bilirubin 3.2 (*)    Anion gap 17 (*)    All other components within normal limits  URINALYSIS, ROUTINE W REFLEX MICROSCOPIC - Abnormal; Notable for the following components:   APPearance TURBID (*)    Hgb urine dipstick MODERATE (*)    Protein, ur >=300 (*)    Leukocytes,Ua MODERATE (*)    All other components within normal limits  LACTIC ACID, PLASMA - Abnormal; Notable for the following components:   Lactic Acid, Venous 6.5 (*)    All other components within normal limits  LACTIC ACID, PLASMA - Abnormal; Notable for the following components:   Lactic Acid, Venous 6.0 (*)    All other components within normal limits  CULTURE, BLOOD (ROUTINE X 2)  CULTURE, BLOOD (ROUTINE X 2)  PROTIME-INR  APTT  TROPONIN I (HIGH SENSITIVITY)  TROPONIN I (HIGH SENSITIVITY)    CT ABDOMEN PELVIS W CONTRAST  Final Result    DG Chest Port 1 View  Final Result    CT HEAD WO CONTRAST ( )  Final Result    IR Radiologist Eval & Mgmt    (Results Pending)    Medications  lactated ringers infusion ( Intravenous New Bag/Given 09/28/22 0040)  lactated ringers infusion ( Intravenous Not Given 09/28/22 0307)  lactated ringers bolus 1,000 mL (0 mLs Intravenous Stopped 09/28/22 0155)  ceFEPIme (MAXIPIME) 2 g in sodium chloride 0.9 % 100 mL IVPB (0 g Intravenous Stopped 09/28/22 0155)  metroNIDAZOLE (FLAGYL) IVPB 500 mg (0 mg Intravenous Stopped 09/28/22 0134)  vancomycin (VANCOCIN) IVPB 1000 mg/200 mL premix (0 mg Intravenous Stopped 09/28/22 0232)  HYDROmorphone  (DILAUDID) injection 0.5 mg (0.5 mg Intravenous Given 09/28/22 0040)  lactated ringers bolus 1,000 mL (0 mLs Intravenous Stopped 09/28/22 0306)    And  lactated ringers bolus 1,000 mL (1,000 mLs Intravenous New Bag/Given 09/28/22 0330)  iohexol (OMNIPAQUE) 300 MG/ML solution 100 mL (100 mLs Intravenous Contrast Given 09/28/22 0225)  sodium bicarbonate injection 100 mEq (100 mEq Intravenous Given 09/28/22 0330)     Procedures  /  Critical Care .Critical Care  Performed by: Roxy Horseman, PA-C Authorized by: Roxy Horseman, PA-C   Critical care provider statement:    Critical care time (minutes):  80   Critical care was necessary to treat or prevent imminent or life-threatening deterioration of the following conditions:  Sepsis   Critical care was time spent personally by me on the following activities:  Development of treatment plan with patient or surrogate, discussions with consultants, evaluation of patient's response to treatment, examination of patient, ordering and review of laboratory studies, ordering and review of radiographic studies, ordering and performing treatments and interventions, pulse oximetry, re-evaluation of patient's condition and review of old charts   ED Course and Medical Decision Making  I have reviewed the triage vital signs, the nursing notes, and pertinent available records from the EMR.  Social Determinants Affecting Complexity of Care: Patient has no clinically significant social determinants affecting this chief complaint..   ED Course: Clinical Course as of 09/28/22 0405  Mon Sep 28, 2022  0026 EKG concerning for ischemia.  Reviewed with Dr. Read Drivers.  Troponins added.   Patient reassessed, denies any chest pain. [RB]    Clinical Course User Index [RB] Roxy Horseman, PA-C    Medical Decision Making Patient with metastatic colon cancer.  Here with new confusion.  Will check labs and reassess.  Labs discussed below.  Patient does not report  any significant abdominal tenderness, but given his recent hospitalization, will repeat CT.  Amount and/or Complexity of Data Reviewed Labs: ordered.    Details: Significant leukocytosis to 23 with left shift.  Patient is not febrile, nor is he hypotensive.  Low threshold for activating sepsis protocol, but waiting on lactic.  Lactic results at 6.5, sepsis protocol activated, patient given weight-based fluids and broad-spectrum antibiotics.  Lactic down trended some after initial liter.  Still getting fluid bolus.  Urinalysis concerning for infection. Radiology: ordered and independent interpretation performed.    Details: Concern for multifocal pneumonia seen on CT as well as pelvic abscess. ECG/medicine tests: ordered and independent interpretation performed.    Details: Appears to have some ischemic changes, but no chest pain, non-elevated troponins.  Risk Prescription drug management. Decision regarding hospitalization.     Consultants: I discussed the  case with Hospitalist, Dr. Joneen Roach, who is appreciated for admitting. I consulted with Dr. Gaynell Face, who will consult.  Plan for hospitalist admission.  Treatment and Plan: Patient's exam and diagnostic results are concerning for sepsis.  Feel that patient will need admission to the hospital for further treatment and evaluation.  Patient seen by and discussed with attending physician, Dr. Read Drivers, who agrees with plan for admission.  Final Clinical Impressions(s) / ED Diagnoses     ICD-10-CM   1. Sepsis, due to unspecified organism, unspecified whether acute organ dysfunction present  A41.9     2. Pelvic abscess in male  K65.1     3. Multifocal pneumonia  J18.9     4. Urinary tract infection associated with indwelling urethral catheter, initial encounter  T83.511A    N39.0       ED Discharge Orders     None         Discharge Instructions Discussed with and Provided to Patient:   Discharge Instructions   None       Roxy Horseman, PA-C 09/28/22 0407    Molpus, Jonny Ruiz, MD 09/28/22 [phone]

## 2022-09-27 NOTE — ED Triage Notes (Signed)
Patient BIB EMS from home c/o leaking catheter. Family report pt keep pulling his catheter. Family report bleeding and leaking in the area. Family wants catheter to be change. Pt hx of Colorectal cancer with brain mets.

## 2022-09-28 ENCOUNTER — Encounter (HOSPITAL_COMMUNITY): Payer: Self-pay | Admitting: Emergency Medicine

## 2022-09-28 ENCOUNTER — Emergency Department (HOSPITAL_COMMUNITY): Payer: Medicaid Other

## 2022-09-28 DIAGNOSIS — A419 Sepsis, unspecified organism: Secondary | ICD-10-CM

## 2022-09-28 DIAGNOSIS — R739 Hyperglycemia, unspecified: Secondary | ICD-10-CM

## 2022-09-28 DIAGNOSIS — J181 Lobar pneumonia, unspecified organism: Secondary | ICD-10-CM | POA: Diagnosis present

## 2022-09-28 DIAGNOSIS — I4891 Unspecified atrial fibrillation: Secondary | ICD-10-CM | POA: Diagnosis not present

## 2022-09-28 DIAGNOSIS — I213 ST elevation (STEMI) myocardial infarction of unspecified site: Secondary | ICD-10-CM | POA: Diagnosis not present

## 2022-09-28 DIAGNOSIS — R17 Unspecified jaundice: Secondary | ICD-10-CM

## 2022-09-28 DIAGNOSIS — R6520 Severe sepsis without septic shock: Secondary | ICD-10-CM | POA: Diagnosis present

## 2022-09-28 DIAGNOSIS — K651 Peritoneal abscess: Secondary | ICD-10-CM

## 2022-09-28 DIAGNOSIS — J9601 Acute respiratory failure with hypoxia: Secondary | ICD-10-CM | POA: Diagnosis not present

## 2022-09-28 DIAGNOSIS — D509 Iron deficiency anemia, unspecified: Secondary | ICD-10-CM | POA: Diagnosis present

## 2022-09-28 DIAGNOSIS — J189 Pneumonia, unspecified organism: Secondary | ICD-10-CM | POA: Diagnosis present

## 2022-09-28 DIAGNOSIS — G9341 Metabolic encephalopathy: Secondary | ICD-10-CM | POA: Diagnosis present

## 2022-09-28 DIAGNOSIS — C7931 Secondary malignant neoplasm of brain: Secondary | ICD-10-CM | POA: Diagnosis present

## 2022-09-28 DIAGNOSIS — N322 Vesical fistula, not elsewhere classified: Secondary | ICD-10-CM | POA: Diagnosis not present

## 2022-09-28 DIAGNOSIS — E875 Hyperkalemia: Secondary | ICD-10-CM

## 2022-09-28 DIAGNOSIS — C20 Malignant neoplasm of rectum: Secondary | ICD-10-CM | POA: Diagnosis not present

## 2022-09-28 DIAGNOSIS — R4182 Altered mental status, unspecified: Secondary | ICD-10-CM

## 2022-09-28 DIAGNOSIS — T83511A Infection and inflammatory reaction due to indwelling urethral catheter, initial encounter: Secondary | ICD-10-CM | POA: Diagnosis present

## 2022-09-28 DIAGNOSIS — E440 Moderate protein-calorie malnutrition: Secondary | ICD-10-CM | POA: Diagnosis present

## 2022-09-28 DIAGNOSIS — C787 Secondary malignant neoplasm of liver and intrahepatic bile duct: Secondary | ICD-10-CM

## 2022-09-28 DIAGNOSIS — C775 Secondary and unspecified malignant neoplasm of intrapelvic lymph nodes: Secondary | ICD-10-CM | POA: Diagnosis present

## 2022-09-28 DIAGNOSIS — Z681 Body mass index (BMI) 19 or less, adult: Secondary | ICD-10-CM | POA: Diagnosis not present

## 2022-09-28 DIAGNOSIS — E871 Hypo-osmolality and hyponatremia: Secondary | ICD-10-CM | POA: Diagnosis present

## 2022-09-28 DIAGNOSIS — N390 Urinary tract infection, site not specified: Secondary | ICD-10-CM

## 2022-09-28 DIAGNOSIS — I2489 Other forms of acute ischemic heart disease: Secondary | ICD-10-CM | POA: Diagnosis present

## 2022-09-28 DIAGNOSIS — R627 Adult failure to thrive: Secondary | ICD-10-CM | POA: Diagnosis not present

## 2022-09-28 DIAGNOSIS — C7800 Secondary malignant neoplasm of unspecified lung: Secondary | ICD-10-CM | POA: Diagnosis present

## 2022-09-28 DIAGNOSIS — Y846 Urinary catheterization as the cause of abnormal reaction of the patient, or of later complication, without mention of misadventure at the time of the procedure: Secondary | ICD-10-CM | POA: Diagnosis present

## 2022-09-28 DIAGNOSIS — Z66 Do not resuscitate: Secondary | ICD-10-CM | POA: Diagnosis present

## 2022-09-28 DIAGNOSIS — D696 Thrombocytopenia, unspecified: Secondary | ICD-10-CM | POA: Diagnosis present

## 2022-09-28 DIAGNOSIS — Z515 Encounter for palliative care: Secondary | ICD-10-CM | POA: Diagnosis not present

## 2022-09-28 DIAGNOSIS — Z933 Colostomy status: Secondary | ICD-10-CM | POA: Diagnosis not present

## 2022-09-28 DIAGNOSIS — K611 Rectal abscess: Secondary | ICD-10-CM | POA: Diagnosis present

## 2022-09-28 DIAGNOSIS — E872 Acidosis, unspecified: Secondary | ICD-10-CM | POA: Diagnosis present

## 2022-09-28 LAB — LACTIC ACID, PLASMA
Lactic Acid, Venous: 6.5 mmol/L (ref 0.5–1.9)
Lactic Acid, Venous: 6 mmol/L (ref 0.5–1.9)
Lactic Acid, Venous: 6 mmol/L (ref 0.5–1.9)
Lactic Acid, Venous: 5.3 mmol/L (ref 0.5–1.9)

## 2022-09-28 LAB — CBC WITH DIFFERENTIAL/PLATELET
Abs Immature Granulocytes: 0.23 10*3/uL — ABNORMAL HIGH (ref 0.00–0.07)
Basophils Absolute: 0 10*3/uL (ref 0.0–0.1)
Basophils Relative: 0 %
Eosinophils Absolute: 0 10*3/uL (ref 0.0–0.5)
Eosinophils Relative: 0 %
HCT: 33.1 % — ABNORMAL LOW (ref 39.0–52.0)
Hemoglobin: 10.3 g/dL — ABNORMAL LOW (ref 13.0–17.0)
Immature Granulocytes: 1 %
Lymphocytes Relative: 2 %
Lymphs Abs: 0.3 10*3/uL — ABNORMAL LOW (ref 0.7–4.0)
MCH: 31.3 pg (ref 26.0–34.0)
MCHC: 31.1 g/dL (ref 30.0–36.0)
MCV: 100.6 fL — ABNORMAL HIGH (ref 80.0–100.0)
Monocytes Absolute: 0.8 10*3/uL (ref 0.1–1.0)
Monocytes Relative: 4 %
Neutro Abs: 17.4 10*3/uL — ABNORMAL HIGH (ref 1.7–7.7)
Neutrophils Relative %: 93 %
Platelets: 224 10*3/uL (ref 150–400)
RBC: 3.29 MIL/uL — ABNORMAL LOW (ref 4.22–5.81)
RDW: 20.2 % — ABNORMAL HIGH (ref 11.5–15.5)
WBC: 18.7 10*3/uL — ABNORMAL HIGH (ref 4.0–10.5)
nRBC: 0 % (ref 0.0–0.2)

## 2022-09-28 LAB — URINALYSIS, ROUTINE W REFLEX MICROSCOPIC
Bacteria, UA: NONE SEEN
Bilirubin Urine: NEGATIVE
Glucose, UA: NEGATIVE mg/dL
Ketones, ur: NEGATIVE mg/dL
Nitrite: NEGATIVE
Protein, ur: >=300 mg/dL — AB
RBC / HPF: >50 RBC/hpf (ref 0–5)
Specific Gravity, Urine: 1.017 (ref 1.005–1.030)
WBC, UA: >50 WBC/hpf (ref 0–5)
pH: 7 (ref 5.0–8.0)

## 2022-09-28 LAB — COMPREHENSIVE METABOLIC PANEL
ALT: 44 U/L (ref 0–44)
AST: 64 U/L — ABNORMAL HIGH (ref 15–41)
Albumin: 2.4 g/dL — ABNORMAL LOW (ref 3.5–5.0)
Alkaline Phosphatase: 248 U/L — ABNORMAL HIGH (ref 38–126)
Anion gap: 15 (ref 5–15)
BUN: 29 mg/dL — ABNORMAL HIGH (ref 6–20)
CO2: 25 mmol/L (ref 22–32)
Calcium: 8.5 mg/dL — ABNORMAL LOW (ref 8.9–10.3)
Chloride: 93 mmol/L — ABNORMAL LOW (ref 98–111)
Creatinine, Ser: 0.7 mg/dL (ref 0.61–1.24)
GFR, Estimated: >60 mL/min (ref >60)
Glucose, Bld: 122 mg/dL — ABNORMAL HIGH (ref 70–99)
Potassium: 4.2 mmol/L (ref 3.5–5.1)
Sodium: 133 mmol/L — ABNORMAL LOW (ref 135–145)
Total Bilirubin: 3 mg/dL — ABNORMAL HIGH (ref 0.3–1.2)
Total Protein: 5.7 g/dL — ABNORMAL LOW (ref 6.5–8.1)

## 2022-09-28 LAB — HEMOGLOBIN A1C
Hgb A1c MFr Bld: 5 % (ref 4.8–5.6)
Mean Plasma Glucose: 96.8 mg/dL

## 2022-09-28 LAB — PROTIME-INR
INR: 1.3 — ABNORMAL HIGH (ref 0.8–1.2)
INR: 1.2 (ref 0.8–1.2)
Prothrombin Time: 16.3 seconds — ABNORMAL HIGH (ref 11.4–15.2)
Prothrombin Time: 15 seconds (ref 11.4–15.2)

## 2022-09-28 LAB — GLUCOSE, CAPILLARY
Glucose-Capillary: 124 mg/dL — ABNORMAL HIGH (ref 70–99)
Glucose-Capillary: 126 mg/dL — ABNORMAL HIGH (ref 70–99)
Glucose-Capillary: 103 mg/dL — ABNORMAL HIGH (ref 70–99)
Glucose-Capillary: 91 mg/dL (ref 70–99)
Glucose-Capillary: 100 mg/dL — ABNORMAL HIGH (ref 70–99)

## 2022-09-28 LAB — OSMOLALITY: Osmolality: 294 mOsm/kg (ref 275–295)

## 2022-09-28 LAB — TROPONIN I (HIGH SENSITIVITY)
Troponin I (High Sensitivity): 15 ng/L (ref <18)
Troponin I (High Sensitivity): 17 ng/L (ref <18)

## 2022-09-28 LAB — APTT: aPTT: 23 seconds — ABNORMAL LOW (ref 24–36)

## 2022-09-28 LAB — MRSA NEXT GEN BY PCR, NASAL: MRSA by PCR Next Gen: NOT DETECTED

## 2022-09-28 LAB — AMMONIA: Ammonia: 34 umol/L (ref 9–35)

## 2022-09-28 MED ORDER — SODIUM CHLORIDE 0.9 % IV SOLN: 2.0000 g | Freq: Three times a day (TID) | INTRAVENOUS | Status: DC

## 2022-09-28 MED ORDER — HYDROMORPHONE HCL 1 MG/ML IJ SOLN
0.5000 mg | INTRAMUSCULAR | Status: DC | PRN
  Administered 2022-09-28 – 2022-09-30 (×7): 1 mg via INTRAVENOUS
  Filled 2022-09-28 (×6): qty 1

## 2022-09-28 MED ORDER — INSULIN ASPART 100 UNIT/ML IJ SOLN
0.0000 [IU] | INTRAMUSCULAR | Status: DC
  Administered 2022-09-28 – 2022-09-29 (×2): 2 [IU] via SUBCUTANEOUS
  Filled 2022-09-28: qty 0.15

## 2022-09-28 MED ORDER — MORPHINE SULFATE (PF) 2 MG/ML IV SOLN
2.0000 mg | INTRAVENOUS | Status: DC | PRN
  Administered 2022-09-28: 2 mg via INTRAVENOUS
  Filled 2022-09-28: qty 1

## 2022-09-28 MED ORDER — VANCOMYCIN HCL 1250 MG/250ML IV SOLN
1250.0000 mg | Freq: Two times a day (BID) | INTRAVENOUS | Status: DC
  Filled 2022-09-28: qty 250

## 2022-09-28 MED ORDER — HYDROMORPHONE HCL 1 MG/ML IJ SOLN
0.5000 mg | INTRAMUSCULAR | Status: DC | PRN
  Filled 2022-09-28: qty 1

## 2022-09-28 MED ORDER — SODIUM CHLORIDE 0.9 % IV SOLN
2.0000 g | Freq: Once | INTRAVENOUS | Status: AC
  Administered 2022-09-28: 2 g via INTRAVENOUS
  Filled 2022-09-28: qty 12.5

## 2022-09-28 MED ORDER — IOHEXOL 300 MG/ML  SOLN
100.0000 mL | Freq: Once | INTRAMUSCULAR | Status: AC | PRN
  Administered 2022-09-28: 100 mL via INTRAVENOUS

## 2022-09-28 MED ORDER — SODIUM CHLORIDE 0.9 % IV SOLN: INTRAVENOUS | Status: DC

## 2022-09-28 MED ORDER — SODIUM CHLORIDE (PF) 0.9 % IJ SOLN
INTRAMUSCULAR | Status: AC
  Filled 2022-09-28: qty 50

## 2022-09-28 MED ORDER — SODIUM CHLORIDE 0.9 % IV SOLN: 2.0000 g | Freq: Two times a day (BID) | INTRAVENOUS | Status: DC

## 2022-09-28 MED ORDER — HYDROMORPHONE HCL 1 MG/ML IJ SOLN
0.5000 mg | Freq: Once | INTRAMUSCULAR | Status: AC
  Administered 2022-09-28: 0.5 mg via INTRAVENOUS
  Filled 2022-09-28: qty 1

## 2022-09-28 MED ORDER — VANCOMYCIN HCL IN DEXTROSE 1-5 GM/200ML-% IV SOLN
1000.0000 mg | Freq: Once | INTRAVENOUS | Status: AC
  Administered 2022-09-28: 1000 mg via INTRAVENOUS
  Filled 2022-09-28: qty 200

## 2022-09-28 MED ORDER — METRONIDAZOLE 500 MG/100ML IV SOLN
500.0000 mg | Freq: Once | INTRAVENOUS | Status: AC
  Administered 2022-09-28: 500 mg via INTRAVENOUS
  Filled 2022-09-28: qty 100

## 2022-09-28 MED ORDER — LACTATED RINGERS IV SOLN: INTRAVENOUS | Status: DC

## 2022-09-28 MED ORDER — LACTATED RINGERS IV BOLUS
1000.0000 mL | Freq: Once | INTRAVENOUS | Status: AC
  Administered 2022-09-28: 1000 mL via INTRAVENOUS

## 2022-09-28 MED ORDER — SODIUM BICARBONATE 8.4 % IV SOLN
100.0000 meq | Freq: Once | INTRAVENOUS | Status: AC
  Administered 2022-09-28: 100 meq via INTRAVENOUS
  Filled 2022-09-28: qty 50

## 2022-09-28 MED ORDER — ORAL CARE MOUTH RINSE
15.0000 mL | OROMUCOSAL | Status: DC | PRN
  Administered 2022-09-30: 15 mL via OROMUCOSAL

## 2022-09-28 MED ORDER — LACTATED RINGERS IV BOLUS (SEPSIS)
1000.0000 mL | Freq: Once | INTRAVENOUS | Status: AC
  Administered 2022-09-28: 1000 mL via INTRAVENOUS

## 2022-09-28 MED ORDER — CHLORHEXIDINE GLUCONATE CLOTH 2 % EX PADS
6.0000 | MEDICATED_PAD | Freq: Every day | CUTANEOUS | Status: DC
  Administered 2022-09-28 – 2022-09-30 (×3): 6 via TOPICAL

## 2022-09-28 MED ORDER — SODIUM CHLORIDE 0.9 % IV SOLN
2.0000 g | INTRAVENOUS | Status: DC
  Administered 2022-09-28 – 2022-09-29 (×2): 2 g via INTRAVENOUS
  Filled 2022-09-28 (×2): qty 20

## 2022-09-28 NOTE — Consult Note (Signed)
NAME:  James Coffey, MRN:  [id number], DOB:  Nov 22, 1970, LOS: 0 ADMISSION DATE:  09/18/2022, CONSULTATION DATE:  09/28/22 REFERRING MD:  [facility], CHIEF COMPLAINT:  lactate elevated   History of Present Illness:  52 yo pt with h/o colon cancer and ostomy placement now metastatic to multiple organs presented to ed with confusion. Ros is unobtainable/unreliable with pt's confusion. He remains hemodynamically stable at this time but appears to have sepsis 2/2 uti. His lactate is 6.5 and ccm was called for recommendations which were ivf and recheck. Lactate did improve slightly with 1L to 6. [facility] admitting but requested formal consult re: elevated lactate.   This lab is elevated 2/2 acute sepsis and also tumor burden in liver with delayed clearance. Not surprising it is elevated and will take time to clear.   Do recommend palliative consultation in light of his diffuse metastatic (presumably).  Defer to primary for conversations with family and goals of care.   Pertinent  Medical History  Colon cancer with mets.   Significant Hospital Events: Including procedures, antibiotic start and stop dates in addition to other pertinent events   Admitted to [facility] for sepsis  Interim History / Subjective:    Objective   Blood pressure (!) 113/94, pulse 76, temperature (!) 97.5 F (36.4 C), temperature source Oral, resp. rate 10, height  (1.753 m), weight 80 kg, SpO2 99 %.        Intake/Output Summary (Last 24 hours) at 09/28/2022 6962 Last data filed at 09/28/2022 0609 Gross per 24 hour  Intake 4172.5 ml  Output --  Net 4172.5 ml   Filed Weights   09/16/2022 2135  Weight: 80 kg    Examination: General: nad, resting comfortably in bed on 1-2L [place] HENT: ncat eomi mm pink but dry Lungs: diminished bilaterally, R ant chest wall port in place Cardiovascular: rrr Abdomen: soft nt/nd bs+ with LLQ ostomy in place Extremities: no c/c/e, muscle wasting Neuro: arousable, but drowsy, oriented to self no  focal deficits GU: deferred  Resolved Hospital Problem list     Assessment & Plan:  Lactic acidosis:  -in setting of sepsis and new tumor burden in liver -continue ivf, will take time to clear in light of above.  -some improvement with 1 L ivf -only on 2L [place] so could utilize some more fluids.   Colon cancer s/p colectomy with ostomy placement.  -new mets in brain, lung, liver -consider palliative care consult.   Sepsis 2/2 uti:  -recommend abx and following cx -hemodynamically stable at this time.   All other issues per primary  Best Practice (right click and "Reselect all SmartList Selections" daily)   Diet/type: per primar DVT prophylaxis: other per primary GI prophylaxis: per primary Lines: N/A no primary Foley:  Yes, and it is still needed port access Code Status:  full code Last date of multidisciplinary goals of care discussion [per primary]  Labs   CBC: Recent Labs  Lab 09/19/2022 2220  WBC 23.4*  NEUTROABS 21.4*  HGB 13.0  HCT 41.9  MCV 101.2*  PLT 323    Basic Metabolic Panel: Recent Labs  Lab 09/09/2022 2220  NA 130*  K 5.2*  CL 95*  CO2 18*  GLUCOSE 200*  BUN 32*  CREATININE 0.77  CALCIUM 9.0   GFR: Estimated Creatinine Clearance: 106.8 mL/min (by C-G formula based on SCr of 0.77 mg/dL). Recent Labs  Lab 10/04/2022 2220 09/28/22 0011 09/28/22 0210  WBC 23.4*  --   --   LATICACIDVEN  --  6.5* 6.0*    Liver Function Tests: Recent Labs  Lab 09/29/2022 2220  AST 51*  ALT 40  ALKPHOS 331*  BILITOT 3.2*  PROT 6.7  ALBUMIN 2.8*   No results for input(s): "LIPASE", "AMYLASE" in the last 168 hours. No results for input(s): "AMMONIA" in the last 168 hours.  ABG    Component Value Date/Time   PHART 7.485 (H) 08/05/2022 1445   PCO2ART 33.9 08/05/2022 1445   PO2ART 297 (H) 08/05/2022 1445   HCO3 25.5 08/05/2022 1445   TCO2 18 (L) 08/10/2022 0853   ACIDBASEDEF 0.3 07/22/2022 0031   O2SAT 100 08/05/2022 1445     Coagulation  Profile: No results for input(s): "INR", "PROTIME" in the last 168 hours.  Cardiac Enzymes: No results for input(s): "CKTOTAL", "CKMB", "CKMBINDEX", "TROPONINI" in the last 168 hours.  HbA1C: Hgb A1c MFr Bld  Date/Time Value Ref Range Status  03/16/2022 08:49 AM 5.5 4.8 - 5.6 % Final    Comment:    (NOTE) Pre diabetes:          5.7%-6.4%  Diabetes:              >6.4%  Glycemic control for   <7.0% adults with diabetes   11/04/2020 03:30 AM 5.6 4.8 - 5.6 % Final    Comment:    (NOTE)         Prediabetes: 5.7 - 6.4         Diabetes: >6.4         Glycemic control for adults with diabetes: <7.0     CBG: Recent Labs  Lab 09/28/22 0613  GLUCAP 124*    Review of Systems:   No pain reported, no personal complaint but also confused at this time.   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   Surgical History:   Past Surgical History:  Procedure Laterality Date   APPLICATION OF CRANIAL NAVIGATION N/A 08/05/2022   Procedure: APPLICATION OF CRANIAL NAVIGATION;  Surgeon: Coletta Memos, MD;  Location: [facility] OR;  Service: Neurosurgery;  Laterality: N/A;   CRANIOTOMY Left 08/05/2022   Procedure: Left frontal craniotomy for resection of tumor with Stealth;  Surgeon: Coletta Memos, MD;  Location[facility];  Service: Neurosurgery;  Laterality: Left;  RM 19 to follow   HERNIA REPAIR     INCISION AND DRAINAGE ABSCESS N/A 12/01/2020   Procedure: INCISION AND DRAINAGE ABSCESS;  Surgeon: Karie Soda, MD;  Location: [facility] ORS;  Service: General;  Laterality: N/A;   INCISION AND DRAINAGE ABSCESS N/A 12/04/2020   Procedure: INCISION AND DRAINAGE FOURNIERS GANGRENE;  Surgeon: Luretha Murphy, MD;  Location: [facility] ORS;  Service: General;  Laterality: N/A;   INCISION AND DRAINAGE PERIRECTAL ABSCESS N/A 11/30/2020   Procedure: INCISION AND DEBRIDEMENT OF PERIRECTAL ABSCESS AND RECTAL WALL BIOPSIES;  Surgeon: Karie Soda,  MD;  Location: [facility]S;  Service: General;  Laterality: N/A;   INGUINAL HERNIA REPAIR Right 2007   pt denies   IR ANGIOGRAM SELECTIVE EACH ADDITIONAL VESSEL  09/01/2022   IR ANGIOGRAM SELECTIVE EACH ADDITIONAL VESSEL  09/01/2022   IR ANGIOGRAM SELECTIVE EACH ADDITIONAL VESSEL  09/16/2022   IR ANGIOGRAM VISCERAL SELECTIVE  09/01/2022   IR ANGIOGRAM VISCERAL SELECTIVE  09/16/2022   IR EMBO TUMOR ORGAN ISCHEMIA INFARCT INC GUIDE ROADMAPPING  09/16/2022   IR IMAGING GUIDED PORT INSERTION  05/07/2021   IR RADIOLOGIST EVAL & MGMT  08/27/2021   IR RADIOLOGIST EVAL &  MGMT  09/12/2021   IR RADIOLOGIST EVAL & MGMT  11/10/2021   IR RADIOLOGIST EVAL & MGMT  01/05/2022   IR SINUS/FIST TUBE CHK-NON GI  04/28/2022   IR US GUIDE VASC ACCESS RIGHT  09/01/2022   IR US GUIDE VASC ACCESS RIGHT  09/16/2022   LAPAROSCOPIC LOOP COLOSTOMY N/A 12/13/2020   Procedure: DIAGNOSTIC LAPAROSCOPY WITH  OPEN DESCENDING LOOP COLOSTOMY;  Surgeon: Darnell Level, MD;  Location: [facility];  Service: General;  Laterality: N/A;   LAPAROSCOPY N/A 08/28/2021   Procedure: LAPAROSCOPY DIAGNOSTIC;  Surgeon: Fritzi Mandes, MD;  Location: [facility] OR;  Service: General;  Laterality: N/A;   OPEN PARTIAL HEPATECTOMY  Left 08/28/2021   Procedure: OPEN LEFT LATERAL HEPATECTOMY WITH INTRAOPERATIVE ULTRASOUND;  Surgeon: Fritzi Mandes, MD;  Location: [facility] OR;  Service: General;  Laterality: Left;   RADIOLOGY WITH ANESTHESIA N/A 10/08/2021   Procedure: MICROWAVE ABLATION;  Surgeon: Irish Lack, MD;  Location: [facility];  Service: Radiology;  Laterality: N/A;   RECTAL SURGERY     XI ROBOTIC ASSISTED PARASTOMAL HERNIA REPAIR N/A 02/04/2022   Procedure: ROBOTIC ABDOMINAL PERINEAL RESECTION, PARASTOMAL HERNIA REPAIR, BILATERAL TAP BLOCK;  Surgeon: Romie Levee, MD;  Location: [facility];  Service: General;  Laterality: N/A;     Social History:   reports that he has been smoking cigarettes. He has been smoking an average of .25 packs per day. He has never used  smokeless tobacco. He reports that he does not drink alcohol and does not use drugs.   Family History:  His family history is negative for CAD and Inflammatory bowel disease.   Allergies Allergies  Allergen Reactions   Hydrocodone Itching   Pork-Derived Products Other (See Comments)    Do not eat pork, Patient is ok with receiving heparin products     Home Medications  Prior to Admission medications   Medication Sig Start Date End Date Taking? Authorizing Provider  acetaminophen (TYLENOL) 500 MG tablet Take 2 tablets (1,000 mg total) by mouth every 8 (eight) hours as needed for mild pain or fever. 12/19/20   Juliet Rude, PA-C  baclofen (LIORESAL) 10 MG tablet Take 1 tablet (10 mg total) by mouth 3 (three) times daily. 09/11/22   Viviano Simas, FNP  benzonatate (TESSALON) 100 MG capsule Take 1 capsule (100 mg total) by mouth 3 (three) times daily as needed. 09/04/22   Margaretann Loveless, PA-C  cyanocobalamin (VITAMIN B12) 1000 MCG tablet Take 1 tablet (1,000 mcg total) by mouth daily. 07/25/22   Albertine Grates, MD  dexamethasone (DECADRON) 2 MG tablet Take 1 tablet (2 mg total) by mouth daily. 08/19/22   Leroy Sea, MD  doxycycline (VIBRA-TABS) 100 MG tablet Take 1 tablet (100 mg total) by mouth 2 (two) times daily. 09/04/22   Margaretann Loveless, PA-C  levETIRAcetam (KEPPRA) 500 MG tablet Take 1 tablet (500 mg total) by mouth 2 (two) times daily. 08/19/22   Leroy Sea, MD  LORazepam (ATIVAN) 1 MG tablet Take 1 tablet (1 mg total) by mouth at bedtime as needed for anxiety. 09/23/22   Johney Maine, MD  midodrine (PROAMATINE) 10 MG tablet Take 1 tablet (10 mg total) by mouth 3 (three) times daily with meals. 08/19/22   Leroy Sea, MD  nystatin (MYCOSTATIN) 100000 UNIT/ML suspension Take 5 mLs (500,000 Units total) by mouth 4 (four) times daily. 08/26/22   Margaretann Loveless, PA-C  oxyCODONE (OXY IR/ROXICODONE) 5 MG immediate release tablet Take 1 tablet (5 mg  total) by  mouth 3 (three) times daily as needed for moderate pain or severe pain. 08/19/22   Leroy Sea, MD  pantoprazole (PROTONIX) 40 MG tablet Take 1 tablet (40 mg total) by mouth daily. 07/25/22 08/24/22  Albertine Grates, MD  polyethylene glycol (MIRALAX) 17 g packet Take 17 g by mouth daily. Take daily to maintain regular soft colostomy output. Can increase to twice a day if need. Patient taking differently: Take 17 g by mouth daily as needed for mild constipation. 06/24/22   Eric Form, PA-C  prochlorperazine (COMPAZINE) 10 MG tablet Take 1 tablet (10 mg total) by mouth every 6 (six) hours as needed for nausea or vomiting. Patient taking differently: Take 10 mg by mouth 3 (three) times daily as needed for nausea or vomiting. 07/20/22   Johney Maine, MD  tamsulosin (FLOMAX) 0.4 MG CAPS capsule Take 1 capsule (0.4 mg total) by mouth daily after supper. 07/25/22   Albertine Grates, MD     Critical care time: 

## 2022-09-28 NOTE — H&P (Addendum)
PCP:   Patient, No Pcp Per   Chief Complaint:  Altered mentation  HPI: This is a 52 year old male, primarily [place] speaking.  He has a significant past medical history of rectal cancer with mets to the liver, lung, and brain.  Patient's sp colectomy, chemotherapy, craniotomy, Y90 radioembolization of liver mets, radiation of lung lesions.  Patient recently admitted 3/43/13 with E. coli bacteremia and E. coli emphysematous cystitis.  During that admission he had an episode of atrial fibrillation.  Patient w/ advanced metastatic rectal cancer with extremely poor prognosis.  Patient was discharged home with palliative care.  Patient wishes to be full code.  Patient sent to ER by family because today he was pulling on his Foley catheter.  He was unable to explain why he kept doing it.  When they finally got him to stop, he did not realize what he was doing.  In the ER patient's lactic acid 6.5, WBCs 23.4, T. bili 3.2, potassium 5.2, CO2 18.  CT chest with pneumonia and pelvic abscess.  Patient afebrile, hemodynamically stable.  With some confusion.  Hospitalization requested.  History provided by chart review, EDP and patient's daughter via phone  Review of Systems:  The patient denies anorexia, fever, weight loss,, vision loss, decreased hearing, hoarseness, chest pain, syncope, dyspnea on exertion, peripheral edema, balance deficits, hemoptysis, abdominal pain, melena, hematochezia, severe indigestion/heartburn, hematuria, incontinence, genital sores, muscle weakness, suspicious skin lesions, transient blindness, difficulty walking, depression, unusual weight change, abnormal bleeding, enlarged lymph nodes, angioedema, and breast masses. Positive: Altered mentation  Past Medical History: 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   pt denies   Cancer    rectal cancer with mets to liver   Tobacco abuse    Past Surgical History:  Procedure Laterality Date    APPLICATION OF CRANIAL NAVIGATION N/A 08/05/2022   Procedure: APPLICATION OF CRANIAL NAVIGATION;  Surgeon: Coletta Memos, MD;  Location: [facility] OR;  Service: Neurosurgery;  Laterality: N/A;   CRANIOTOMY Left 08/05/2022   Procedure: Left frontal craniotomy for resection of tumor with Stealth;  Surgeon: Coletta Memos, MD;  Location[facility] OR;  Service: Neurosurgery;  Laterality: Left;  RM 19 to follow   HERNIA REPAIR     INCISION AND DRAINAGE ABSCESS N/A 12/01/2020   Procedure: INCISION AND DRAINAGE ABSCESS;  Surgeon: Karie Soda, MD;  Location: [facility] ORS;  Service: General;  Laterality: N/A;   INCISION AND DRAINAGE ABSCESS N/A 12/04/2020   Procedure: INCISION AND DRAINAGE FOURNIERS GANGRENE;  Surgeon: Luretha Murphy, MD;  Location: [facility];  Service: General;  Laterality: N/A;   INCISION AND DRAINAGE PERIRECTAL ABSCESS N/A 11/30/2020   Procedure: INCISION AND DEBRIDEMENT OF PERIRECTAL ABSCESS AND RECTAL WALL BIOPSIES;  Surgeon: Karie Soda, MD;  Location: [facility];  Service: General;  Laterality: N/A;   INGUINAL HERNIA REPAIR Right 2007   pt denies   IR ANGIOGRAM SELECTIVE EACH ADDITIONAL VESSEL  09/01/2022   IR ANGIOGRAM SELECTIVE EACH ADDITIONAL VESSEL  09/01/2022   IR ANGIOGRAM SELECTIVE EACH ADDITIONAL VESSEL  09/16/2022   IR ANGIOGRAM VISCERAL SELECTIVE  09/01/2022   IR ANGIOGRAM VISCERAL SELECTIVE  09/16/2022   IR EMBO TUMOR ORGAN ISCHEMIA INFARCT INC GUIDE ROADMAPPING  09/16/2022   IR IMAGING GUIDED PORT INSERTION  05/07/2021   IR RADIOLOGIST EVAL & MGMT  08/27/2021   IR RADIOLOGIST EVAL & MGMT  09/12/2021   IR RADIOLOGIST EVAL & MGMT  11/10/2021   IR RADIOLOGIST EVAL & MGMT  01/05/2022   IR  SINUS/FIST TUBE CHK-NON GI  04/28/2022   IR US GUIDE VASC ACCESS RIGHT  09/01/2022   IR US GUIDE VASC ACCESS RIGHT  09/16/2022   LAPAROSCOPIC LOOP COLOSTOMY N/A 12/13/2020   Procedure: DIAGNOSTIC LAPAROSCOPY WITH  OPEN DESCENDING LOOP COLOSTOMY;  Surgeon: Darnell Level, MD;  Location: [facility];  Service: General;   Laterality: N/A;   LAPAROSCOPY N/A 08/28/2021   Procedure: LAPAROSCOPY DIAGNOSTIC;  Surgeon: Fritzi Mandes, MD;  Location: [facility] OR;  Service: General;  Laterality: N/A;   OPEN PARTIAL HEPATECTOMY  Left 08/28/2021   Procedure: OPEN LEFT LATERAL HEPATECTOMY WITH INTRAOPERATIVE ULTRASOUND;  Surgeon: Fritzi Mandes, MD;  Location: [facility] OR;  Service: General;  Laterality: Left;   RADIOLOGY WITH ANESTHESIA N/A 10/08/2021   Procedure: MICROWAVE ABLATION;  Surgeon: Irish Lack, MD;  Location: [facility];  Service: Radiology;  Laterality: N/A;   RECTAL SURGERY     XI ROBOTIC ASSISTED PARASTOMAL HERNIA REPAIR N/A 02/04/2022   Procedure: ROBOTIC ABDOMINAL PERINEAL RESECTION, PARASTOMAL HERNIA REPAIR, BILATERAL TAP BLOCK;  Surgeon: Romie Levee, MD;  Location: [facility];  Service: General;  Laterality: N/A;    Medications: Prior to Admission medications   Medication Sig Start Date End Date Taking? Authorizing Provider  acetaminophen (TYLENOL) 500 MG tablet Take 2 tablets (1,000 mg total) by mouth every 8 (eight) hours as needed for mild pain or fever. 12/19/20   Juliet Rude, PA-C  baclofen (LIORESAL) 10 MG tablet Take 1 tablet (10 mg total) by mouth 3 (three) times daily. 09/11/22   Viviano Simas, FNP  benzonatate (TESSALON) 100 MG capsule Take 1 capsule (100 mg total) by mouth 3 (three) times daily as needed. 09/04/22   Margaretann Loveless, PA-C  cyanocobalamin (VITAMIN B12) 1000 MCG tablet Take 1 tablet (1,000 mcg total) by mouth daily. 07/25/22   Albertine Grates, MD  dexamethasone (DECADRON) 2 MG tablet Take 1 tablet (2 mg total) by mouth daily. 08/19/22   Leroy Sea, MD  doxycycline (VIBRA-TABS) 100 MG tablet Take 1 tablet (100 mg total) by mouth 2 (two) times daily. 09/04/22   Margaretann Loveless, PA-C  levETIRAcetam (KEPPRA) 500 MG tablet Take 1 tablet (500 mg total) by mouth 2 (two) times daily. 08/19/22   Leroy Sea, MD  LORazepam (ATIVAN) 1 MG tablet Take 1 tablet (1 mg total) by mouth at  bedtime as needed for anxiety. 09/23/22   Johney Maine, MD  midodrine (PROAMATINE) 10 MG tablet Take 1 tablet (10 mg total) by mouth 3 (three) times daily with meals. 08/19/22   Leroy Sea, MD  nystatin (MYCOSTATIN) 100000 UNIT/ML suspension Take 5 mLs (500,000 Units total) by mouth 4 (four) times daily. 08/26/22   Margaretann Loveless, PA-C  oxyCODONE (OXY IR/ROXICODONE) 5 MG immediate release tablet Take 1 tablet (5 mg total) by mouth 3 (three) times daily as needed for moderate pain or severe pain. 08/19/22   Leroy Sea, MD  pantoprazole (PROTONIX) 40 MG tablet Take 1 tablet (40 mg total) by mouth daily. 07/25/22 08/24/22  Albertine Grates, MD  polyethylene glycol (MIRALAX) 17 g packet Take 17 g by mouth daily. Take daily to maintain regular soft colostomy output. Can increase to twice a day if need. Patient taking differently: Take 17 g by mouth daily as needed for mild constipation. 06/24/22   Eric Form, PA-C  prochlorperazine (COMPAZINE) 10 MG tablet Take 1 tablet (10 mg total) by mouth every 6 (six) hours as needed for nausea or vomiting. Patient taking  differently: Take 10 mg by mouth 3 (three) times daily as needed for nausea or vomiting. 07/20/22   Johney Maine, MD  tamsulosin (FLOMAX) 0.4 MG CAPS capsule Take 1 capsule (0.4 mg total) by mouth daily after supper. 07/25/22   Albertine Grates, MD    Allergies:   Allergies  Allergen Reactions   Hydrocodone Itching   Pork-Derived Products Other (See Comments)    Do not eat pork, Patient is ok with receiving heparin products    Social History:  reports that he has been smoking cigarettes. He has been smoking an average of .25 packs per day. He has never used smokeless tobacco. He reports that he does not drink alcohol and does not use drugs.  Family History: Family History  Problem Relation Age of Onset   CAD Neg Hx    Inflammatory bowel disease Neg Hx     Physical Exam: Vitals:   09/28/22 0145 09/28/22 0205  09/28/22 0245 09/28/22 0330  BP: (!) 118/92  (!) 117/91 (!) 127/91  Pulse: 74  77 75  Resp: 13  11 13   Temp:  98.3 F (36.8 C)    TempSrc:  Oral    SpO2: 97%  95% 98%  Weight:      Height:        General:  Alert and arousals, some confusion, well developed and nourished, no acute distress, ill appearing Eyes: PERRLA, pink conjunctiva, scleral icterus ENT: dry oral mucosa, neck supple, no thyromegaly Lungs: clear to ascultation, no wheeze, no crackles, no use of accessory muscles Cardiovascular: regular rate and rhythm, no regurgitation, no gallops, no murmurs. No carotid bruits, no JVD. Port right chest wall Abdomen: soft, positive BS, non-tender, non-distended, no organomegaly, not an acute abdomen, colostomy LLQ GU: Foley in place, blood-tinged urine Neuro: CN II - XII grossly intact Musculoskeletal: moves all extremities, no cyanosis or edema Skin: no rash, no subcutaneous crepitation, no decubitus, jaundiced Psych: somewhat confused patient   Labs on Admission:  Recent Labs    09/11/2022 2220  NA 130*  K 5.2*  CL 95*  CO2 18*  GLUCOSE 200*  BUN 32*  CREATININE 0.77  CALCIUM 9.0   Recent Labs    09/08/2022 2220  AST 51*  ALT 40  ALKPHOS 331*  BILITOT 3.2*  PROT 6.7  ALBUMIN 2.8*    Recent Labs    09/08/2022 2220  WBC 23.4*  NEUTROABS 21.4*  HGB 13.0  HCT 41.9  MCV 101.2*  PLT 323     Radiological Exams on Admission: CT ABDOMEN PELVIS W CONTRAST  Result Date: 09/28/2022 CLINICAL DATA:  Abdominal pain. EXAM: CT ABDOMEN AND PELVIS WITH CONTRAST TECHNIQUE: Multidetector CT imaging of the abdomen and pelvis was performed using the standard protocol following bolus administration of intravenous contrast. RADIATION DOSE REDUCTION: This exam was performed according to the departmental dose-optimization program which includes automated exposure control, adjustment of the mA and/or kV according to patient size and/or use of iterative reconstruction technique.  CONTRAST:  OMNIPAQUE IOHEXOL 300 MG/ML  SOLN COMPARISON:  August 13, 2022 FINDINGS: Lower chest: Mild multifocal right middle lobe, left upper lobe and right lower lobe infiltrates are seen. A 3.3 cm x 2.1 cm lingular soft tissue mass and adjacent 9 mm diameter lung nodule are seen. Hepatobiliary: Innumerable heterogeneous low-attenuation liver masses of various sizes are seen within an enlarged liver. These are markedly increased in size and number when compared to the prior study. Multiple surgical clips are seen along the anterior  aspect of the left lobe of the liver consistent with history of partial hepatectomy. The gallbladder is contracted. No gallstones, gallbladder wall thickening, or biliary dilatation. Pancreas: Unremarkable. No pancreatic ductal dilatation or surrounding inflammatory changes. Spleen: A 9 mm focus of parenchymal low attenuation is seen within the posteromedial aspect of a heterogeneous appearing spleen. Adrenals/Urinary Tract: Adrenal glands are unremarkable. Kidneys are normal in size, without renal calculi or hydronephrosis. A 2.0 cm simple cyst is seen within the anteromedial aspect of the upper right kidney. A Foley catheter is seen within an empty urinary bladder. Stomach/Bowel: Stomach is within normal limits. Postoperative changes are seen with a subsequent left lower quadrant ostomy site. There is an associated large fat containing parastomal hernia. No evidence of bowel wall thickening, distention, or inflammatory changes. Vascular/Lymphatic: Aortic atherosclerosis. A 1.5 cm lymph node is seen within the region anterior to the suprarenal abdominal aorta and posterior aspect of the left lobe of the liver (axial CT image 30, CT series 2). This represents a new finding. Reproductive: The prostate gland is poorly visualized. Other: A 5.3 cm x 3.5 cm x 6.9 cm complex abscess is seen within the posterior aspect of the pelvis. Is within the presacral region and extends from the  region posterior to the urinary bladder to the rectum. Marked severity presacral inflammatory fat stranding is also seen. A fistulous connection to the urinary bladder cannot be excluded. Musculoskeletal: Mild degenerative changes are seen within the lower lumbar spine. IMPRESSION: 1. 5.3 cm x 3.5 cm x 6.9 cm complex abscess within the posterior aspect of the pelvis, as described above. A fistulous connection to the urinary bladder cannot be excluded. 2. Mild multifocal right middle lobe, left upper lobe and right lower lobe infiltrates. 3. 3.3 cm x 2.1 cm lingular soft tissue mass and adjacent 9 mm diameter lung nodule, consistent with metastatic disease. 4. Innumerable heterogeneous low-attenuation liver masses of various sizes, markedly increased in size and number when compared to the prior study, consistent with worsening metastatic disease. 5. Postoperative changes with a left lower quadrant ostomy site. 6. Large fat containing parastomal hernia. 7. Aortic atherosclerosis. Aortic Atherosclerosis (ICD10-I70.0). Electronically Signed   By: Aram Candela M.D.   On: 09/28/2022 02:42   DG Chest Port 1 View  Result Date: 09/28/2022 CLINICAL DATA:  Metastatic colon cancer EXAM: PORTABLE CHEST 1 VIEW COMPARISON:  08/16/2022 FINDINGS: The lungs are well expanded. Previously noted diffuse airspace infiltrate has largely resolved though nodular opacities persist within the peripheral upper lung zones bilaterally. Pulmonary mass is seen at the left lung base partially silhouetting the cardiac border corresponding to the lobulated pulmonary mass within the lingula seen on CT examination of 08/10/2022. No pneumothorax or pleural effusion. Right internal jugular chest port tip noted within the superior cavoatrial junction. Cardiac size within normal limits. No acute bone abnormality. IMPRESSION: 1. Near complete resolution of previously noted diffuse airspace infiltrate. Persistent nodular opacities within the  peripheral upper lung zones bilaterally. 2. Left basilar pulmonary mass, grossly stable since CT examination of 08/10/2022. Electronically Signed   By: Helyn Numbers M.D.   On: 09/28/2022 01:11   CT HEAD WO CONTRAST ( )  Result Date: 25-Oct-2022 CLINICAL DATA:  Delirium. EXAM: CT HEAD WITHOUT CONTRAST TECHNIQUE: Contiguous axial images were obtained from the base of the skull through the vertex without intravenous contrast. RADIATION DOSE REDUCTION: This exam was performed according to the departmental dose-optimization program which includes automated exposure control, adjustment of the mA and/or kV according to patient  size and/or use of iterative reconstruction technique. COMPARISON:  Head CT 08/10/2022 FINDINGS: Brain: Near complete of the previous postoperative pneumocephalus and blood products. Encephalomalacia in the left frontal lobe. Expected postsurgical changes in the overlying dura. The previous midline shift has resolved. No evidence of acute intracranial hemorrhage or ischemia no intracranial mass on this unenhanced exam. No hydrocephalus. Vascular: Atherosclerosis of skullbase vasculature without hyperdense vessel or abnormal calcification. Skull: Prior left frontal craniotomy. No evidence of a calvarial lesion. Sinuses/Orbits: Similar fluid/mucosal thickening in right side of sphenoid sinus. Paranasal sinuses are otherwise clear. No mastoid effusion. Other: No evidence of scalp fluid collection. IMPRESSION: 1. No acute intracranial abnormality. 2. Encephalomalacia in the left frontal lobe. Near complete resolution of previous postoperative pneumocephalus and blood products. Expected postsurgical changes in the overlying dura. Electronically Signed   By: Narda Rutherford M.D.   On: 09/26/2022 23:52    Assessment/Plan Present on Admission:  AMS (altered mental status)  Sepsis due to pneumonia and pelvic abscesses/lactic acidosis -Admitted to stepdown -Broad broad-spectrum antibiotics  cefepime and vancomycin, pharmacy dosing -IV fluid hydration -Following lactic acid curve -Lactic acid 6.5, patient hemodynamically stable.  CCM has been consulted by EDP, they have okayed patient for admission to stepdown.  They will leave a note.   Hyponatremia -IV fluid hydration.  CMP in a.m.   Hypokalemia -Treating with IV fluid hydration.  Repeat CMP at noon -Hyponatremia mild, at risk of developing SIADH.  Urine sodium, urine osmolality and plasma osmolality ordered   Hyperglycemia -Sliding scale insulin -Patient currently n.p.o.   Jaundice -Multifactorial etiology.  Liver mets [which are larger], recent Y90 embolization, possible sepsis. -CMP in a.m.  -Ammonia level and INR ordered for today.   Autonomic hypotension -Continue midodrine, and decadron   Recent left frontal craniotomy with tumor resection - 2/28 (Dr. Franky Macho) Stable.  Continue Keppra for 1 more month upon discharge.  Thereafter per neurosurgery.  -Patient still on Keppra.  Defer to a.m. team to continue to touch base with NS   Foley in place -Foley catheter remains in place-Per urology consult note-leave Foley catheter in place-until repeat imaging shows resolution of presenting issues. -CT abdomen and pelvis without intraluminal gas in bladder or perivesicular stranding.  No gas in urinary bladder.  No current evidence of residual emphysematous cystitis.  However, will defer to a.m. team/urology removal of Foley  -Not convinced patient with recurrent UTI vs simply colonization leading to the WBCs noted on urinalysis. -Patient on broad-spectrum antibiotics for above -Continue Flomax   Metastatic rectal carcinoma  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  Rectal cancer metastasized to liver  Rectal cancer metastasized to lung -Per patient and oncology -PRN ativan resumed  Goals of care Per Palliative note 08/11/22: Upon my entrance patient is agitated - he tells me "I want to live my  life more," "do not tell me I'm going to die," "I am good!" Attempted to provide therapeutic presence - informed patient that we did not have to discuss information as we did yesterday; however, it would be important to continue discussions as having a clear understanding of his current situation is important. Patient again yells, "I want to live! I am not dead!" He no longer wishes for PMT to be involved as he does not want to discuss GOC - he only wants to "talk about living." His goals remain clear for full code/full scope. Patient does endorse breathing much better today. I offered to call his family to simply offer support, but  he states "do not call my family.  -Palliative not re-consulted    Metastatic rectal adenocarcinoma 11/30/2020 - rectal biopsies confirm adenocarcinoma  12/13/2020 - descending loop colostomy, Dr. Gerrit Friends S/p neoadjuvant FOLFOX chemotherapy 08/28/2021- open left hepatectomy Dr. Freida Busman 10/2021- ablation of a right liver metastasis  S/p chemotherapy and radiation therapy of rectal mass S/p radiation therapy of lung lesions 02/04/2022 - robotic APR, parastomal hernia repair Dr. Maisie Fus  S/p radiation therapy of lung lesions 10-04/2022 IR drainage of pre-sacral collections, injection studies not significant for fistula to bladder at that time. 07/31/2022 S/p single dose of radiation therapy to the brain 08/05/2022 - left frontal craniotomy for tumor resection, Dr. Franky Macho  09/16/22 -  right hepatic Y-90 radioembolization of liver metastasis by IR Dr. Veryl Speak, Cordel Drewes 09/28/2022, 4:08 AM

## 2022-09-28 NOTE — Consult Note (Signed)
WOC Nurse ostomy consult note Stoma type/location: LUQ colostomy present since 7/22. Last seen by my associate D. Engels on 07/22/22. Please see note from that encounter. Stomal assessment/size: Not seen today Peristomal assessment: Not seen today Treatment options for stomal/peristomal skin: None, patient does not use a skin barrier ring Ostomy pouching: 2pc. 2 and 3/4 inch ostomy pouching system: Pouch is [employer] [id number], skin barrier is Press photographer # 2 Education provided: None Enrolled patient in [employer] DC program: No. Patient is established with a supplier.  Wife assists patient with ostomy care. Guidance has been provided to Nursing to provide 5 sets of ostomy pouching systems to the Bedside.  WOC nursing team will not follow, but will remain available to this patient, the nursing and medical teams.  Please re-consult if needed.  Thank you for inviting [place] to participate in this patient's Plan of Care.  Ladona Mow, MSN, RN, CNS, GNP, Leda Min, [employer], [employer] phone:  [phone]

## 2022-09-28 NOTE — Progress Notes (Signed)
Pharmacy Antibiotic Note  James Coffey is a 52 y.o. male admitted on 09/19/2022 with confusion.  Hx of colon cancer and recent diagnosis of new tumor in brain.  Pharmacy has been consulted for vancomycin and cefepime dosing for sepsis.  1st doses given in the ED  Plan: Vancomycin  IV q12h (AUC 466.5, Scr 0.8) Cefepime 2gm IV q8h Follow renal function, cultures and clinical course  Height:  (175.3 cm) Weight: 80 kg (176 lb 5.9 oz) IBW/kg (Calculated) : 70.7  Temp (24hrs), Avg:98.2 F (36.8 C), Min:98 F (36.7 C), Max:98.3 F (36.8 C)  Recent Labs  Lab 09/29/2022 2220 09/28/22 0011 09/28/22 0210  WBC 23.4*  --   --   CREATININE 0.77  --   --   LATICACIDVEN  --  6.5* 6.0*    Estimated Creatinine Clearance: 106.8 mL/min (by C-G formula based on SCr of 0.77 mg/dL).    Allergies  Allergen Reactions   Hydrocodone Itching   Pork-Derived Products Other (See Comments)    Do not eat pork, Patient is ok with receiving heparin products    Antimicrobials this admission: Cefepime 4/22 >> Vanc 4/22 >> Flagyl 4/22 x 1  Dose adjustments this admission:   Microbiology results: 4/21 BCx:   Thank you for allowing pharmacy to be a part of this patient's care. Arley Phenix RPh 09/28/2022, 4:26 AM

## 2022-09-28 NOTE — Consult Note (Signed)
James Coffey 1971-01-23  [id number].    Requesting MD: Dr. Rickey Barbara Chief Complaint/Reason for Consult: presacral abscess  HPI:  This is a 52 yo [place] male who is well-known to our service for metastatic rectal cancer.  He has a "complex medical history significant for diverting colostomy in the setting of fournier's gangrene s/p multiple debridements in 2022, followed by diagnosis of rectal cancer with liver metastasis s/p partial left hepatectomy in March 2023 by Dr. Freida Busman. He then underwent percutaneous ablation of a right liver tumor in early May as well as abdominal perineal resection, robotic approach 02/04/22 by Dr. Maisie Fus. He completed radiation therapy June 2023. He had a pre-sacral fluid collection that has been drained by IR. Unfortunately his perineal wound broke down and is requiring wet-to-dry dressing changes, this was last evaluated in our office on 07/07/22 by Dr. Maisie Fus.  During her exam she made note of a perineal mass that she suspected was inflammatory but that would require biopsy if it grew in size. Last week the patient underwent left frontal craniotomy for resection of a tumor 08/05/22 by Dr. 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  His CT cysto at the time was negative.    He was discharged home with a foley in place.  The patient is not a great historian and sounds like he re-presented due to chronic abdominal pain but the chart states he was having confusion and woke up yelling and pulling at this catheter and ostomy bag.  He has had a new CT scan that reveals the same presacral fluid collection that appears to be abscess but also tumor.  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  He states he has been draining some type  of fluid from his rectum for an unknown period of time.  We have been asked to see the patient for further recommendations regarding this collection.  He does have a WBC of 18 today but was 23K yesterday on admit.  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   pt denies   Cancer    rectal cancer with mets to liver   Tobacco abuse     Past Surgical History:  Procedure Laterality Date   APPLICATION OF CRANIAL NAVIGATION N/A 08/05/2022   Procedure: APPLICATION OF CRANIAL NAVIGATION;  Surgeon: Coletta Memos, MD;  Location: [facility];  Service: Neurosurgery;  Laterality: N/A;   CRANIOTOMY Left 08/05/2022   Procedure: Left frontal craniotomy for resection of tumor with Stealth;  Surgeon: Coletta Memos, MD;  Location: [facility] OR;  Service: Neurosurgery;  Laterality: Left;  RM 19 to follow   HERNIA REPAIR     INCISION AND DRAINAGE ABSCESS N/A 12/01/2020   Procedure: INCISION AND DRAINAGE ABSCESS;  Surgeon: Karie Soda, MD;  Location: [facility] ORS;  Service: General;  Laterality: N/A;   INCISION AND DRAINAGE ABSCESS N/A 12/04/2020   Procedure: INCISION AND DRAINAGE FOURNIERS GANGRENE;  Surgeon: Luretha Murphy, MD;  Location: [facility];  Service: General;  Laterality: N/A;   INCISION AND DRAINAGE PERIRECTAL ABSCESS N/A 11/30/2020   Procedure: INCISION AND  DEBRIDEMENT OF PERIRECTAL ABSCESS AND RECTAL WALL BIOPSIES;  Surgeon: Karie Soda, MD;  Location: [facility];  Service: General;  Laterality: N/A;   INGUINAL HERNIA REPAIR Right 2007   pt denies   IR ANGIOGRAM SELECTIVE EACH ADDITIONAL VESSEL  09/01/2022   IR ANGIOGRAM SELECTIVE EACH ADDITIONAL VESSEL  09/01/2022   IR ANGIOGRAM SELECTIVE EACH ADDITIONAL VESSEL  09/16/2022   IR ANGIOGRAM VISCERAL SELECTIVE  09/01/2022   IR ANGIOGRAM VISCERAL SELECTIVE  09/16/2022   IR EMBO TUMOR ORGAN ISCHEMIA INFARCT INC GUIDE  ROADMAPPING  09/16/2022   IR IMAGING GUIDED PORT INSERTION  05/07/2021   IR RADIOLOGIST EVAL & MGMT  08/27/2021   IR RADIOLOGIST EVAL & MGMT  09/12/2021   IR RADIOLOGIST EVAL & MGMT  11/10/2021   IR RADIOLOGIST EVAL & MGMT  01/05/2022   IR SINUS/FIST TUBE CHK-NON GI  04/28/2022   IR US GUIDE VASC ACCESS RIGHT  09/01/2022   IR US GUIDE VASC ACCESS RIGHT  09/16/2022   LAPAROSCOPIC LOOP COLOSTOMY N/A 12/13/2020   Procedure: DIAGNOSTIC LAPAROSCOPY WITH  OPEN DESCENDING LOOP COLOSTOMY;  Surgeon: Darnell Level, MD;  Location: [facility];  Service: General;  Laterality: N/A;   LAPAROSCOPY N/A 08/28/2021   Procedure: LAPAROSCOPY DIAGNOSTIC;  Surgeon: Fritzi Mandes, MD;  [facility] OR;  Service: General;  Laterality: N/A;   OPEN PARTIAL HEPATECTOMY  Left 08/28/2021   Procedure: OPEN LEFT LATERAL HEPATECTOMY WITH INTRAOPERATIVE ULTRASOUND;  Surgeon: Fritzi Mandes, MD;  Location: [facility] OR;  Service: General;  Laterality: Left;   RADIOLOGY WITH ANESTHESIA N/A 10/08/2021   Procedure: MICROWAVE ABLATION;  Surgeon: Irish Lack, MD;  Location: [facility];  Service: Radiology;  Laterality: N/A;   RECTAL SURGERY     XI ROBOTIC ASSISTED PARASTOMAL HERNIA REPAIR N/A 02/04/2022   Procedure: ROBOTIC ABDOMINAL PERINEAL RESECTION, PARASTOMAL HERNIA REPAIR, BILATERAL TAP BLOCK;  Surgeon: Romie Levee, MD;  Location: [facility];  Service: General;  Laterality: N/A;    Social History:  reports that he has been smoking cigarettes. He has been smoking an average of .25 packs per day. He has never used smokeless tobacco. He reports that he does not drink alcohol and does not use drugs.  Allergies:  Allergies  Allergen Reactions   Hydrocodone Itching   Pork-Derived Products Other (See Comments)    Do not eat pork, Patient is ok with receiving heparin products    Medications Prior to Admission  Medication Sig Dispense Refill   acetaminophen (TYLENOL) 500 MG tablet Take 2 tablets (1,000 mg total) by mouth every 8 (eight)  hours as needed for mild pain or fever.     baclofen (LIORESAL) 10 MG tablet Take 1 tablet (10 mg total) by mouth 3 (three) times daily. 30 each 0   benzonatate (TESSALON) 100 MG capsule Take 1 capsule (100 mg total) by mouth 3 (three) times daily as needed. 30 capsule 0   cyanocobalamin (VITAMIN B12) 1000 MCG tablet Take 1 tablet (1,000 mcg total) by mouth daily. 30 tablet 0   dexamethasone (DECADRON) 2 MG tablet Take 1 tablet (2 mg total) by mouth daily. 30 tablet 0   doxycycline (VIBRA-TABS) 100 MG tablet Take 1 tablet (100 mg total) by mouth 2 (two) times daily. 20 tablet 0   levETIRAcetam (KEPPRA) 500 MG tablet Take 1 tablet (500 mg total) by mouth 2 (two) times daily. 60 tablet 0   LORazepam (ATIVAN) 1 MG tablet Take 1 tablet (1 mg total) by mouth at bedtime as needed for anxiety. 30 tablet  0   midodrine (PROAMATINE) 10 MG tablet Take 1 tablet (10 mg total) by mouth 3 (three) times daily with meals. 90 tablet 0   nystatin (MYCOSTATIN) 100000 UNIT/ML suspension Take 5 mLs (500,000 Units total) by mouth 4 (four) times daily. 120 mL 0   oxyCODONE (OXY IR/ROXICODONE) 5 MG immediate release tablet Take 1 tablet (5 mg total) by mouth 3 (three) times daily as needed for moderate pain or severe pain. 20 tablet 0   pantoprazole (PROTONIX) 40 MG tablet Take 1 tablet (40 mg total) by mouth daily. 30 tablet 0   polyethylene glycol (MIRALAX) 17 g packet Take 17 g by mouth daily. Take daily to maintain regular soft colostomy output. Can increase to twice a day if need. (Patient taking differently: Take 17 g by mouth daily as needed for mild constipation.) 14 each 0   prochlorperazine (COMPAZINE) 10 MG tablet Take 1 tablet (10 mg total) by mouth every 6 (six) hours as needed for nausea or vomiting. (Patient taking differently: Take 10 mg by mouth 3 (three) times daily as needed for nausea or vomiting.) 30 tablet 0   tamsulosin (FLOMAX) 0.4 MG CAPS capsule Take 1 capsule (0.4 mg total) by mouth daily after  supper. 30 capsule 0     Physical Exam: Blood pressure (!) 147/111, pulse 81, temperature 97.7 F (36.5 C), temperature source Oral, resp. rate 13, height 5\' 9"  (1.753 m), weight 38.6 kg, SpO2 (!) 86 %. General: pleasant, chronically ill appearing white male who is laying in bed in NAD HEENT: head is normocephalic, atraumatic.  Sclera are noninjected.  PERRL.  Ears and nose without any masses or lesions.  Mouth is pink and moist Heart: regular, rate, and rhythm.  Normal s1,s2. No obvious murmurs, gallops, or rubs noted.  Lungs: CTAB, no wheezes, rhonchi, or rales noted.  Respiratory effort nonlabored Abd: soft, mildly tender, ND, +BS, no masses or organomegaly.  Left-sided colostomy in place with minimal output, but bag appears to be fresh.  Hernia present around colostomy. GU: rectum with obvious tumor present growing out of the anus.  He has clear drainage from the rectum consistent with urine, copious amounts, despite foley in place Psych: Alert and seems oriented, but not clear that he fully grasps the extent of his situation   Results for orders placed or performed during the hospital encounter of 10/03/2022 (from the past 48 hour(s))  CBC with Differential     Status: Abnormal   Collection Time: 09/25/2022 10:20 PM  Result Value Ref Range   WBC 23.4 (H) 4.0 - 10.5 K/uL   RBC 4.14 (L) 4.22 - 5.81 MIL/uL   Hemoglobin 13.0 13.0 - 17.0 g/dL   HCT 78.2 95.6 - 21.3 %   MCV 101.2 (H) 80.0 - 100.0 fL   MCH 31.4 26.0 - 34.0 pg   MCHC 31.0 30.0 - 36.0 g/dL   RDW 08.6 (H) 57.8 - 46.9 %   Platelets 323 150 - 400 K/uL   nRBC 0.1 0.0 - 0.2 %   Neutrophils Relative % 91 %   Neutro Abs 21.4 (H) 1.7 - 7.7 K/uL   Lymphocytes Relative 1 %   Lymphs Abs 0.2 (L) 0.7 - 4.0 K/uL   Monocytes Relative 6 %   Monocytes Absolute 1.4 (H) 0.1 - 1.0 K/uL   Eosinophils Relative 0 %   Eosinophils Absolute 0.0 0.0 - 0.5 K/uL   Basophils Relative 0 %   Basophils Absolute 0.1 0.0 - 0.1 K/uL   Immature  Granulocytes 2 %   Abs Immature Granulocytes 0.37 (H) 0.00 - 0.07 K/uL    Comment: Performed at [facility], [address], [place], [place] [address]  Comprehensive metabolic panel     Status: Abnormal   Collection Time: 10/02/22 10:20 PM  Result Value Ref Range   Sodium 130 (L) 135 - 145 mmol/L   Potassium 5.2 (H) 3.5 - 5.1 mmol/L   Chloride 95 (L) 98 - 111 mmol/L   CO2 18 (L) 22 - 32 mmol/L   Glucose, Bld 200 (H) 70 - 99 mg/dL    Comment: Glucose reference range applies only to samples taken after fasting for at least 8 hours.   BUN 32 (H) 6 - 20 mg/dL   Creatinine, Ser 6.04 0.61 - 1.24 mg/dL   Calcium 9.0 8.9 - 54.0 mg/dL   Total Protein 6.7 6.5 - 8.1 g/dL   Albumin 2.8 (L) 3.5 - 5.0 g/dL   AST 51 (H) 15 - 41 U/L   ALT 40 0 - 44 U/L   Alkaline Phosphatase 331 (H) 38 - 126 U/L   Total Bilirubin 3.2 (H) 0.3 - 1.2 mg/dL   GFR, Estimated >98 >11 mL/min    Comment: (NOTE) Calculated using the CKD-EPI Creatinine Equation (2021)    Anion gap 17 (H) 5 - 15    Comment: Performed at [facility], [address], [place], [place] [address]  Urinalysis, Routine w reflex microscopic -Urine, Catheterized; Indwelling urinary catheter     Status: Abnormal   Collection Time: Oct 02, 2022 10:20 PM  Result Value Ref Range   Color, Urine YELLOW YELLOW   APPearance TURBID (A) CLEAR   Specific Gravity, Urine 1.017 1.005 - 1.030   pH 7.0 5.0 - 8.0   Glucose, UA NEGATIVE NEGATIVE mg/dL   Hgb urine dipstick MODERATE (A) NEGATIVE   Bilirubin Urine NEGATIVE NEGATIVE   Ketones, ur NEGATIVE NEGATIVE mg/dL   Protein, ur >=295 (A) NEGATIVE mg/dL   Nitrite NEGATIVE NEGATIVE   Leukocytes,Ua MODERATE (A) NEGATIVE   RBC / HPF >50 0 - 5 RBC/hpf   WBC, UA >50 0 - 5 WBC/hpf   Bacteria, UA NONE SEEN NONE SEEN   Squamous Epithelial / HPF 0-5 0 - 5 /HPF   WBC Clumps PRESENT    Mucus PRESENT     Comment: Performed at [facility], [address],  [place], [place] [address]  Troponin I (High Sensitivity)     Status: None   Collection Time: 10/02/22 11:02 PM  Result Value Ref Range   Troponin I (High Sensitivity) 15 <18 ng/L    Comment: (NOTE) Elevated high sensitivity troponin I (hsTnI) values and significant  changes across serial measurements may suggest ACS but many other  chronic and acute conditions are known to elevate hsTnI results.  Refer to the "Links" section for chest pain algorithms and additional  guidance. Performed at [facility], [address], [place], [place] [address]   Blood Culture (routine x 2)     Status: None (Preliminary result)   Collection Time: 2022/10/02 11:47 PM   Specimen: Porta Cath; Blood  Result Value Ref Range   Specimen Description PORTA CATH    Special Requests      BOTTLES DRAWN AEROBIC AND ANAEROBIC Blood Culture results may not be optimal due to an inadequate volume of blood received in culture bottles Performed at [facility], [address] [place], [place] [address]    Culture  PENDING    Report Status PENDING   Lactic acid, plasma     Status: Abnormal   Collection Time: 09/28/22 12:11 AM  Result Value Ref Range   Lactic Acid, Venous 6.5 (HH) 0.5 - 1.9 mmol/L    Comment: CRITICAL RESULT CALLED TO, READ BACK BY AND VERIFIED WITH CARTER,S AT 0118 ON 09/28/22 BY LUZOLOP Performed at [facility], [address], [place], [place] [address]   Lactic acid, plasma     Status: Abnormal   Collection Time: 09/28/22  2:10 AM  Result Value Ref Range   Lactic Acid, Venous 6.0 (HH) 0.5 - 1.9 mmol/L    Comment: CRITICAL VALUE NOTED. VALUE IS CONSISTENT WITH PREVIOUSLY REPORTED/CALLED VALUE Performed at [facility], [address], [place], [place] [address]   Troponin I (High Sensitivity)     Status: None   Collection Time: 09/28/22  2:10 AM  Result Value Ref Range   Troponin I (High Sensitivity) 17 <18 ng/L    Comment:  (NOTE) Elevated high sensitivity troponin I (hsTnI) values and significant  changes across serial measurements may suggest ACS but many other  chronic and acute conditions are known to elevate hsTnI results.  Refer to the "Links" section for chest pain algorithms and additional  guidance. Performed at [facility], [address] [place], [place] [address]   Comprehensive metabolic panel     Status: Abnormal   Collection Time: 09/28/22  5:30 AM  Result Value Ref Range   Sodium 133 (L) 135 - 145 mmol/L   Potassium 4.2 3.5 - 5.1 mmol/L   Chloride 93 (L) 98 - 111 mmol/L   CO2 25 22 - 32 mmol/L   Glucose, Bld 122 (H) 70 - 99 mg/dL    Comment: Glucose reference range applies only to samples taken after fasting for at least 8 hours.   BUN 29 (H) 6 - 20 mg/dL   Creatinine, Ser 2.13 0.61 - 1.24 mg/dL   Calcium 8.5 (L) 8.9 - 10.3 mg/dL   Total Protein 5.7 (L) 6.5 - 8.1 g/dL   Albumin 2.4 (L) 3.5 - 5.0 g/dL   AST 64 (H) 15 - 41 U/L   ALT 44 0 - 44 U/L   Alkaline Phosphatase 248 (H) 38 - 126 U/L   Total Bilirubin 3.0 (H) 0.3 - 1.2 mg/dL   GFR, Estimated >08 >65 mL/min    Comment: (NOTE) Calculated using the CKD-EPI Creatinine Equation (2021)    Anion gap 15 5 - 15    Comment: Performed at [facility], [address], [place], [place] [address]  Protime-INR     Status: Abnormal   Collection Time: 09/28/22  5:30 AM  Result Value Ref Range   Prothrombin Time 16.3 (H) 11.4 - 15.2 seconds   INR 1.3 (H) 0.8 - 1.2    Comment: (NOTE) INR goal varies based on device and disease states. Performed at [facility], [address] [place], [place] [address]   Ammonia     Status: None   Collection Time: 09/28/22  5:30 AM  Result Value Ref Range   Ammonia 34 9 - 35 umol/L    Comment: HEMOLYSIS AT THIS LEVEL MAY AFFECT RESULT Performed at [facility], [address] [place], [place] [address]   Lactic acid, plasma      Status: Abnormal   Collection Time: 09/28/22  5:30 AM  Result Value Ref Range   Lactic Acid, Venous  6.0 (HH) 0.5 - 1.9 mmol/L    Comment: CRITICAL VALUE NOTED. VALUE IS CONSISTENT WITH PREVIOUSLY REPORTED/CALLED VALUE Performed at [facility], [address], [place], [place] [address]   MRSA Next Gen by PCR, Nasal     Status: None   Collection Time: 09/28/22  6:09 AM   Specimen: Nasal Mucosa; Nasal Swab  Result Value Ref Range   MRSA by PCR Next Gen NOT DETECTED NOT DETECTED    Comment: (NOTE) The GeneXpert MRSA Assay (FDA approved for NASAL specimens only), is one component of a comprehensive MRSA colonization surveillance program. It is not intended to diagnose MRSA infection nor to guide or monitor treatment for MRSA infections. Test performance is not FDA approved in patients less than 53 years old. Performed at [facility], [address], [place], [place] [address]   Glucose, capillary     Status: Abnormal   Collection Time: 09/28/22  6:13 AM  Result Value Ref Range   Glucose-Capillary 124 (H) 70 - 99 mg/dL    Comment: Glucose reference range applies only to samples taken after fasting for at least 8 hours.  CBC with Differential/Platelet     Status: Abnormal   Collection Time: 09/28/22  8:06 AM  Result Value Ref Range   WBC 18.7 (H) 4.0 - 10.5 K/uL   RBC 3.29 (L) 4.22 - 5.81 MIL/uL   Hemoglobin 10.3 (L) 13.0 - 17.0 g/dL   HCT 09.8 (L) 11.9 - 14.7 %   MCV 100.6 (H) 80.0 - 100.0 fL   MCH 31.3 26.0 - 34.0 pg   MCHC 31.1 30.0 - 36.0 g/dL   RDW 82.9 (H) 56.2 - 13.0 %   Platelets 224 150 - 400 K/uL   nRBC 0.0 0.0 - 0.2 %   Neutrophils Relative % 93 %   Neutro Abs 17.4 (H) 1.7 - 7.7 K/uL   Lymphocytes Relative 2 %   Lymphs Abs 0.3 (L) 0.7 - 4.0 K/uL   Monocytes Relative 4 %   Monocytes Absolute 0.8 0.1 - 1.0 K/uL   Eosinophils Relative 0 %   Eosinophils Absolute 0.0 0.0 - 0.5 K/uL   Basophils Relative 0 %   Basophils  Absolute 0.0 0.0 - 0.1 K/uL   Immature Granulocytes 1 %   Abs Immature Granulocytes 0.23 (H) 0.00 - 0.07 K/uL    Comment: Performed at [facility], [address], [place], [place] [address]  Hemoglobin A1c     Status: None   Collection Time: 09/28/22  8:06 AM  Result Value Ref Range   Hgb A1c MFr Bld 5.0 4.8 - 5.6 %    Comment: (NOTE) Pre diabetes:          5.7%-6.4%  Diabetes:              >6.4%  Glycemic control for   <7.0% adults with diabetes    Mean Plasma Glucose 96.8 mg/dL    Comment: Performed at [facility], [address], [place], [place] [address]  Glucose, capillary     Status: Abnormal   Collection Time: 09/28/22  8:28 AM  Result Value Ref Range   Glucose-Capillary 126 (H) 70 - 99 mg/dL    Comment: Glucose reference range applies only to samples taken after fasting for at least 8 hours.  Protime-INR     Status: None   Collection Time: 09/28/22 10:58 AM  Result Value Ref Range   Prothrombin Time 15.0 11.4 - 15.2 seconds  INR 1.2 0.8 - 1.2    Comment: (NOTE) INR goal varies based on device and disease states. Performed at [facility], [address] [place], [place] [address]   APTT     Status: Abnormal   Collection Time: 09/28/22 10:58 AM  Result Value Ref Range   aPTT 23 (L) 24 - 36 seconds    Comment: Performed at [facility], [address], [place], [place] [address]  Lactic acid, plasma     Status: Abnormal   Collection Time: 09/28/22 10:58 AM  Result Value Ref Range   Lactic Acid, Venous 5.3 (HH) 0.5 - 1.9 mmol/L    Comment: CRITICAL VALUE NOTED. VALUE IS CONSISTENT WITH PREVIOUSLY REPORTED/CALLED VALUE Performed at [facility] [address], [place], [place] [address]   Glucose, capillary     Status: Abnormal   Collection Time: 09/28/22 12:04 PM  Result Value Ref Range   Glucose-Capillary 103 (H) 70 - 99 mg/dL    Comment: Glucose reference range applies only  to samples taken after fasting for at least 8 hours.   CT ABDOMEN PELVIS W CONTRAST  Result Date: 09/28/2022 CLINICAL DATA:  Abdominal pain. EXAM: CT ABDOMEN AND PELVIS WITH CONTRAST TECHNIQUE: Multidetector CT imaging of the abdomen and pelvis was performed using the standard protocol following bolus administration of intravenous contrast. RADIATION DOSE REDUCTION: This exam was performed according to the departmental dose-optimization program which includes automated exposure control, adjustment of the mA and/or kV according to patient size and/or use of iterative reconstruction technique. CONTRAST:  OMNIPAQUE IOHEXOL 300 MG/ML  SOLN COMPARISON:  August 13, 2022 FINDINGS: Lower chest: Mild multifocal right middle lobe, left upper lobe and right lower lobe infiltrates are seen. A 3.3 cm x 2.1 cm lingular soft tissue mass and adjacent 9 mm diameter lung nodule are seen. Hepatobiliary: Innumerable heterogeneous low-attenuation liver masses of various sizes are seen within an enlarged liver. These are markedly increased in size and number when compared to the prior study. Multiple surgical clips are seen along the anterior aspect of the left lobe of the liver consistent with history of partial hepatectomy. The gallbladder is contracted. No gallstones, gallbladder wall thickening, or biliary dilatation. Pancreas: Unremarkable. No pancreatic ductal dilatation or surrounding inflammatory changes. Spleen: A 9 mm focus of parenchymal low attenuation is seen within the posteromedial aspect of a heterogeneous appearing spleen. Adrenals/Urinary Tract: Adrenal glands are unremarkable. Kidneys are normal in size, without renal calculi or hydronephrosis. A 2.0 cm simple cyst is seen within the anteromedial aspect of the upper right kidney. A Foley catheter is seen within an empty urinary bladder. Stomach/Bowel: Stomach is within normal limits. Postoperative changes are seen with a subsequent left lower quadrant ostomy  site. There is an associated large fat containing parastomal hernia. No evidence of bowel wall thickening, distention, or inflammatory changes. Vascular/Lymphatic: Aortic atherosclerosis. A 1.5 cm lymph node is seen within the region anterior to the suprarenal abdominal aorta and posterior aspect of the left lobe of the liver (axial CT image 30, CT series 2). This represents a new finding. Reproductive: The prostate gland is poorly visualized. Other: A 5.3 cm x 3.5 cm x 6.9 cm complex abscess is seen within the posterior aspect of the pelvis. Is within the presacral region and extends from the region posterior to the urinary bladder to the rectum. Marked severity presacral inflammatory fat stranding is also seen. A fistulous connection to the urinary bladder cannot be excluded. Musculoskeletal: Mild  degenerative changes are seen within the lower lumbar spine. IMPRESSION: 1. 5.3 cm x 3.5 cm x 6.9 cm complex abscess within the posterior aspect of the pelvis, as described above. A fistulous connection to the urinary bladder cannot be excluded. 2. Mild multifocal right middle lobe, left upper lobe and right lower lobe infiltrates. 3. 3.3 cm x 2.1 cm lingular soft tissue mass and adjacent 9 mm diameter lung nodule, consistent with metastatic disease. 4. Innumerable heterogeneous low-attenuation liver masses of various sizes, markedly increased in size and number when compared to the prior study, consistent with worsening metastatic disease. 5. Postoperative changes with a left lower quadrant ostomy site. 6. Large fat containing parastomal hernia. 7. Aortic atherosclerosis. Aortic Atherosclerosis (ICD10-I70.0). Electronically Signed   By: Aram Candela M.D.   On: 09/28/2022 02:42   DG Chest Port 1 View  Result Date: 09/28/2022 CLINICAL DATA:  Metastatic colon cancer EXAM: PORTABLE CHEST 1 VIEW COMPARISON:  08/16/2022 FINDINGS: The lungs are well expanded. Previously noted diffuse airspace infiltrate has largely  resolved though nodular opacities persist within the peripheral upper lung zones bilaterally. Pulmonary mass is seen at the left lung base partially silhouetting the cardiac border corresponding to the lobulated pulmonary mass within the lingula seen on CT examination of 08/10/2022. No pneumothorax or pleural effusion. Right internal jugular chest port tip noted within the superior cavoatrial junction. Cardiac size within normal limits. No acute bone abnormality. IMPRESSION: 1. Near complete resolution of previously noted diffuse airspace infiltrate. Persistent nodular opacities within the peripheral upper lung zones bilaterally. 2. Left basilar pulmonary mass, grossly stable since CT examination of 08/10/2022. Electronically Signed   By: Helyn Numbers M.D.   On: 09/28/2022 01:11   CT HEAD WO CONTRAST ( )  Result Date: 2022/10/23 CLINICAL DATA:  Delirium. EXAM: CT HEAD WITHOUT CONTRAST TECHNIQUE: Contiguous axial images were obtained from the base of the skull through the vertex without intravenous contrast. RADIATION DOSE REDUCTION: This exam was performed according to the departmental dose-optimization program which includes automated exposure control, adjustment of the mA and/or kV according to patient size and/or use of iterative reconstruction technique. COMPARISON:  Head CT 08/10/2022 FINDINGS: Brain: Near complete of the previous postoperative pneumocephalus and blood products. Encephalomalacia in the left frontal lobe. Expected postsurgical changes in the overlying dura. The previous midline shift has resolved. No evidence of acute intracranial hemorrhage or ischemia no intracranial mass on this unenhanced exam. No hydrocephalus. Vascular: Atherosclerosis of skullbase vasculature without hyperdense vessel or abnormal calcification. Skull: Prior left frontal craniotomy. No evidence of a calvarial lesion. Sinuses/Orbits: Similar fluid/mucosal thickening in right side of sphenoid sinus. Paranasal sinuses  are otherwise clear. No mastoid effusion. Other: No evidence of scalp fluid collection. IMPRESSION: 1. No acute intracranial abnormality. 2. Encephalomalacia in the left frontal lobe. Near complete resolution of previous postoperative pneumocephalus and blood products. Expected postsurgical changes in the overlying dura. Electronically Signed   By: Narda Rutherford M.D.   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 I have discussed this patient with IR as well.  The patient has extensive, worsening metastatic disease that clearly appears to have progressed in the last month, just since his last CT scan.  He has this persistent area in the presacral space with external evidence of tumor burden along with what appears to be urine draining from his rectum.  He does  have a component of this that is abscess.  IR states that this is drainable; however, given this connection to his bladder this drain would almost certainly stay in the rest of his life and not be removed.  The patient and I discussed this via an interpreter.  We discussed that this may help with this area of infection; however, this would not cure or even help his worsening metastatic cancer.  We discussed that his situation is dire and that despite all of his treatments, his cancer appears to fairly rapidly be worsening.  We discussed options of no drain and to discuss comfort measures to control his pain and symptoms.  We also discussed that IR would be agreeable to a drain if he wishes to continue to pursue treatment.  He thinks he wants to pursue treatment, but would like to think about it tonight.  This patient would really benefit from further palliative care intervention and discussion with his oncologist to determine if there are any further treatment options or this is  futile.  We will recheck the patient in the morning, but do not have anything else really to offer from a surgical standpoint.  Cont abx therapy.   FEN - NPO, IVFs VTE - ok for prophylaxis from surgical standpoint ID - [email or web address]   I reviewed hospitalist notes, last 24 h vitals and pain scores, last 48 h intake and output, last 24 h labs and trends, and last 24 h imaging results.  Letha Cape, [facility] 09/28/2022, 2:26 PM Please see Amion for pager number during day hours 7:00am-4:30pm or 7:00am -11:30am on weekends

## 2022-09-28 NOTE — Hospital Course (Signed)
52 year old man PMH metastatic rectal cancer to liver, lung, brain, with history of chemo radiation, colostomy radioembolization liver mets and craniotomy presenting with confusion, admitted for sepsis.   Consultants Urology General Surgery Oncology Palliative Medicine PCCM  Procedures

## 2022-09-28 NOTE — Sepsis Progress Note (Signed)
Elink following for Sepsis Protocol 

## 2022-09-28 NOTE — Progress Notes (Signed)
Progress Note   Patient: James Coffey ZOX:[id number] DOB: 1970/11/16 DOA: 09/12/2022     0 DOS: the patient was seen and examined on 09/28/2022   Brief hospital course: 52 year old male, primarily [place] speaking.  He has a significant past medical history of rectal cancer with mets to the liver, lung, and brain.  Patient's sp colectomy, chemotherapy, craniotomy, Y90 radioembolization of liver mets, radiation of lung lesions.  Patient recently admitted 3/43/13 with E. coli bacteremia and E. coli emphysematous cystitis.  During that admission he had an episode of atrial fibrillation.  Patient w/ advanced metastatic rectal cancer with extremely poor prognosis.  Patient was discharged home with palliative care.  Patient wishes to be full code.   Patient sent to ER by family because today he was pulling on his Foley catheter.  He was unable to explain why he kept doing it.  When they finally got him to stop, he did not realize what he was doing.   In the ER patient's lactic acid 6.5, WBCs 23.4, T. bili 3.2, potassium 5.2, CO2 18.  CT chest with pneumonia and pelvic abscess.  Patient afebrile, hemodynamically stable.  With some confusion.  Hospitalization requested.    Assessment and Plan: Present on Admission:  Toxic metabolic encephalopathy  Sepsis due to pelvic abscesses/lactic acidosis present on admit, pneumonia ruled out -Continue cefepime as tolerated -IV fluid hydration -Following lactic acid curve -CT abd reviewed. 5x7cm abscess in the posterior pelvis with concerns of fistulizing into the bladder noted -Appreciate input by General Surgery and Urology. Unfortunately, given pt's comorbidities and metastatic disease, pt's situation is noted to be dire. Consideration for IR drainage, however a drain would almost certainly stay in the rest of his life, per Surgery -Palliative Care has been consulted. Chart reviewed and pt historically had previously declined discussing end of life issues.  However, given the unfortunate circumstances, have told pt and family that a goals of care discussion is absolutely necessary at this time    Hyponatremia -cont IV fluid hydration.   -recheck bmet in AM    Hypokalemia -Corrected -Recheck bmet in AM    Hyperglycemia -Sliding scale insulin -Patient currently n.p.o.    Jaundice -Suspect related to worsening liver mets -ammonia levels unremarkable -recheck CMP in aM    Autonomic hypotension -Continue midodrine, and decadron    Recent left frontal craniotomy with tumor resection - 2/28 (Dr. Franky Macho) Stable.  Continue Keppra for 1 more month upon discharge.  Thereafter per neurosurgery.  -Patient still on Keppra, continued    Atrial fib -Not a candidate for anticoagulation currently given severe thrombocytopenia, recent craniotomy with surgery and now transitioning to home palliative care.    Foley in place -Foley replaced 4/21 -Urology following -Patient on broad-spectrum antibiotics for above -Continued Flomax    Metastatic rectal carcinoma  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  Rectal cancer metastasized to liver  Rectal cancer metastasized to lung -Per patient and oncology -PRN ativan resumed   Subjective: Pt complaining of increased abd pain today, asking for more pain meds  Physical Exam: Vitals:   09/28/22 1422 09/28/22 1500 09/28/22 1600 09/28/22 1700  BP:  110/81 118/88 116/88  Pulse: 81  93   Resp: Temp:      TempSrc:      SpO2: (!) 86%     Weight:      Height:       General exam: Awake, laying in bed, in nad Respiratory system: Normal respiratory  effort, no wheezing Cardiovascular system: regular rate, s1, s2 Gastrointestinal system: Soft, nondistended, positive BS Central nervous system: CN2-12 grossly intact, strength intact Extremities: Perfused, no clubbing Skin: Normal skin turgor, no notable skin lesions seen Psychiatry: Mood normal // no visual  hallucinations   Data Reviewed:  Labs reviewed: na 133, K 4.2, Cr 0.7, Lactate 5.3, WBC 18.7, Hgb 10.3  Family Communication: Pt in room, family at bedside  Disposition: Status is: Inpatient Remains inpatient appropriate because: Severity of illness  Planned Discharge Destination:  Unclear at this time    Author: Rickey Barbara, MD 09/28/2022 5:46 PM  For on call review [email or web address].

## 2022-09-28 NOTE — Progress Notes (Addendum)
Urology Consult  Referring physician: Dr. Rhona Leavens Reason for referral: perisacral abscess leakage, possible bladder communication  Chief Complaint: Fatigue and altered mental status  History of Present Illness:  James Coffey is a 52y male presents to [facility] emergency department at the direction of family because he was pulling on his Foley catheter but unaware that he was doing it.  On arrival he was found to have lactic acidosis, WBCs to 23.4, elevated potassium and T. bili.  CT imaging revealed a pelvic abscess with a cutaneous perirectal, and possible bladder communication.  PMH is significant for rectal cancer metastatic to the liver, lung, and brain.  S/p colectomy, chemotherapy, craniotomy, radio embolization of liver metastasis and radiation of lung lesions.  He was seen by our service in June 2022 for Fournier's gangrene requiring multiple debridements. 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  Assessment was completed with the aid of a video interpreter.  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   pt denies   Cancer    rectal cancer with mets to liver   Tobacco abuse    Past Surgical History:  Procedure Laterality Date   APPLICATION OF CRANIAL NAVIGATION N/A 08/05/2022   Procedure: APPLICATION OF CRANIAL NAVIGATION;  Surgeon: Coletta Memos, MD;  Location: [facility] OR;  Service: Neurosurgery;  Laterality: N/A;   CRANIOTOMY Left 08/05/2022   Procedure: Left frontal craniotomy for resection of tumor with Stealth;  Surgeon: Coletta Memos, MD;  Location: [facility] OR;  Service: Neurosurgery;  Laterality: Left;  RM 19 to follow   HERNIA REPAIR     INCISION AND DRAINAGE ABSCESS N/A 12/01/2020   Procedure: INCISION AND DRAINAGE ABSCESS;  Surgeon: Karie Soda, MD;  Location: [facility] ORS;  Service: General;  Laterality: N/A;   INCISION AND DRAINAGE ABSCESS N/A 12/04/2020   Procedure: INCISION AND DRAINAGE FOURNIERS GANGRENE;  Surgeon: Luretha Murphy, MD;  Location: [facility];  Service: General;  Laterality: N/A;   INCISION AND DRAINAGE PERIRECTAL ABSCESS N/A 11/30/2020   Procedure: INCISION AND DEBRIDEMENT OF PERIRECTAL ABSCESS AND RECTAL WALL BIOPSIES;  Surgeon: Karie Soda, MD;  Location: [facility];  Service: General;  Laterality: N/A;   INGUINAL HERNIA REPAIR Right 2007   pt denies   IR ANGIOGRAM SELECTIVE EACH ADDITIONAL VESSEL  09/01/2022   IR ANGIOGRAM SELECTIVE EACH ADDITIONAL VESSEL  09/01/2022   IR ANGIOGRAM SELECTIVE EACH ADDITIONAL VESSEL  09/16/2022   IR ANGIOGRAM VISCERAL SELECTIVE  09/01/2022   IR ANGIOGRAM VISCERAL SELECTIVE  09/16/2022   IR EMBO TUMOR ORGAN ISCHEMIA INFARCT INC GUIDE ROADMAPPING  09/16/2022   IR IMAGING GUIDED PORT INSERTION  05/07/2021   IR RADIOLOGIST EVAL & MGMT  08/27/2021   IR RADIOLOGIST EVAL & MGMT  09/12/2021   IR RADIOLOGIST EVAL & MGMT  11/10/2021   IR RADIOLOGIST EVAL & MGMT  01/05/2022   IR SINUS/FIST TUBE CHK-NON GI  04/28/2022   IR US GUIDE VASC ACCESS RIGHT  09/01/2022   IR US GUIDE VASC ACCESS RIGHT  09/16/2022   LAPAROSCOPIC LOOP COLOSTOMY N/A 12/13/2020   Procedure: DIAGNOSTIC LAPAROSCOPY WITH  OPEN DESCENDING LOOP COLOSTOMY;  Surgeon: Darnell Level, MD;  Location: [facility];  Service: General;  Laterality: N/A;   LAPAROSCOPY N/A 08/28/2021   Procedure: LAPAROSCOPY DIAGNOSTIC;  Surgeon: Fritzi Mandes, MD;  Location: [facility] OR;  Service: General;  Laterality: N/A;   OPEN PARTIAL HEPATECTOMY  Left 08/28/2021   Procedure: OPEN LEFT LATERAL HEPATECTOMY WITH INTRAOPERATIVE ULTRASOUND;  Surgeon: Freida Busman,  Ree Kida, MD;  Location: [facility] OR;  Service: General;  Laterality: Left;   RADIOLOGY WITH ANESTHESIA N/A 10/08/2021   Procedure: MICROWAVE ABLATION;  Surgeon: Irish Lack, MD;  Location: [facility];  Service: Radiology;  Laterality: N/A;   RECTAL SURGERY     XI ROBOTIC ASSISTED PARASTOMAL HERNIA REPAIR N/A 02/04/2022   Procedure: ROBOTIC ABDOMINAL PERINEAL RESECTION, PARASTOMAL HERNIA REPAIR, BILATERAL  TAP BLOCK;  Surgeon: Romie Levee, MD;  Location: [facility];  Service: General;  Laterality: N/A;     Allergies:  Allergies  Allergen Reactions   Hydrocodone Itching   Pork-Derived Products Other (See Comments)    Do not eat pork, Patient is ok with receiving heparin products    Family History  Problem Relation Age of Onset   CAD Neg Hx    Inflammatory bowel disease Neg Hx    Social History:  reports that he has been smoking cigarettes. He has been smoking an average of .25 packs per day. He has never used smokeless tobacco. He reports that he does not drink alcohol and does not use drugs.  ROS: UTA   Physical Exam:  Vital signs in last 24 hours: Temp:  [97.5 F (36.4 C)-98.3 F (36.8 C)] 97.7 F (36.5 C) (04/22 1115) Pulse Rate:  [65-100] 81 (04/22 1422) Resp:  [10-23] 13 (04/22 1422) BP: (95-147)/(75-111) 147/111 (04/22 1400) SpO2:  [86 %-100 %] 86 % (04/22 1422) Weight:  [38.6 kg-80 kg] 38.6 kg (04/22 0600)  Cardiovascular: Skin warm; not flushed Respiratory: Breaths quiet; no shortness of breath Abdomen: Colostomy and multiple surgical scars. Neurological: Variable mentation. Musculoskeletal: Normal motor function arms and legs Genitourinary: Foley catheter in place.  Appears to have been poorly maintained.  Dark brown foul-smelling urine in bag..  Laboratory Data:  Results for orders placed or performed during the hospital encounter of 10/17/22 (from the past 72 hour(s))  CBC with Differential     Status: Abnormal   Collection Time: 10-17-22 10:20 PM  Result Value Ref Range   WBC 23.4 (H) 4.0 - 10.5 K/uL   RBC 4.14 (L) 4.22 - 5.81 MIL/uL   Hemoglobin 13.0 13.0 - 17.0 g/dL   HCT 29.5 62.1 - 30.8 %   MCV 101.2 (H) 80.0 - 100.0 fL   MCH 31.4 26.0 - 34.0 pg   MCHC 31.0 30.0 - 36.0 g/dL   RDW 65.7 (H) 84.6 - 96.2 %   Platelets 323 150 - 400 K/uL   nRBC 0.1 0.0 - 0.2 %   Neutrophils Relative % 91 %   Neutro Abs 21.4 (H) 1.7 - 7.7 K/uL   Lymphocytes Relative 1 %    Lymphs Abs 0.2 (L) 0.7 - 4.0 K/uL   Monocytes Relative 6 %   Monocytes Absolute 1.4 (H) 0.1 - 1.0 K/uL   Eosinophils Relative 0 %   Eosinophils Absolute 0.0 0.0 - 0.5 K/uL   Basophils Relative 0 %   Basophils Absolute 0.1 0.0 - 0.1 K/uL   Immature Granulocytes 2 %   Abs Immature Granulocytes 0.37 (H) 0.00 - 0.07 K/uL    Comment: Performed at [facility], [address] [place], [place] [address]  Comprehensive metabolic panel     Status: Abnormal   Collection Time: 17-Oct-2022 10:20 PM  Result Value Ref Range   Sodium 130 (L) 135 - 145 mmol/L   Potassium 5.2 (H) 3.5 - 5.1 mmol/L   Chloride 95 (L) 98 - 111 mmol/L   CO2 18 (L) 22 - 32 mmol/L  Glucose, Bld 200 (H) 70 - 99 mg/dL    Comment: Glucose reference range applies only to samples taken after fasting for at least 8 hours.   BUN 32 (H) 6 - 20 mg/dL   Creatinine, Ser 1.61 0.61 - 1.24 mg/dL   Calcium 9.0 8.9 - 09.6 mg/dL   Total Protein 6.7 6.5 - 8.1 g/dL   Albumin 2.8 (L) 3.5 - 5.0 g/dL   AST 51 (H) 15 - 41 U/L   ALT 40 0 - 44 U/L   Alkaline Phosphatase 331 (H) 38 - 126 U/L   Total Bilirubin 3.2 (H) 0.3 - 1.2 mg/dL   GFR, Estimated >04 >54 mL/min    Comment: (NOTE) Calculated using the CKD-EPI Creatinine Equation (2021)    Anion gap 17 (H) 5 - 15    Comment: Performed at [facility], [address], [place], [place] [address]  Urinalysis, Routine w reflex microscopic -Urine, Catheterized; Indwelling urinary catheter     Status: Abnormal   Collection Time: 09/23/2022 10:20 PM  Result Value Ref Range   Color, Urine YELLOW YELLOW   APPearance TURBID (A) CLEAR   Specific Gravity, Urine 1.017 1.005 - 1.030   pH 7.0 5.0 - 8.0   Glucose, UA NEGATIVE NEGATIVE mg/dL   Hgb urine dipstick MODERATE (A) NEGATIVE   Bilirubin Urine NEGATIVE NEGATIVE   Ketones, ur NEGATIVE NEGATIVE mg/dL   Protein, ur >=914 (A) NEGATIVE mg/dL   Nitrite NEGATIVE NEGATIVE   Leukocytes,Ua MODERATE (A) NEGATIVE    RBC / HPF >50 0 - 5 RBC/hpf   WBC, UA >50 0 - 5 WBC/hpf   Bacteria, UA NONE SEEN NONE SEEN   Squamous Epithelial / HPF 0-5 0 - 5 /HPF   WBC Clumps PRESENT    Mucus PRESENT     Comment: Performed at [facility], [address] [place], [place] [address]  Troponin I (High Sensitivity)     Status: None   Collection Time: 09/30/2022 11:02 PM  Result Value Ref Range   Troponin I (High Sensitivity) 15 <18 ng/L    Comment: (NOTE) Elevated high sensitivity troponin I (hsTnI) values and significant  changes across serial measurements may suggest ACS but many other  chronic and acute conditions are known to elevate hsTnI results.  Refer to the "Links" section for chest pain algorithms and additional  guidance. Performed at [facility], [address], [place], [place] [address]   Blood Culture (routine x 2)     Status: None (Preliminary result)   Collection Time: 09/16/2022 11:47 PM   Specimen: Porta Cath; Blood  Result Value Ref Range   Specimen Description PORTA CATH    Special Requests      BOTTLES DRAWN AEROBIC AND ANAEROBIC Blood Culture results may not be optimal due to an inadequate volume of blood received in culture bottles Performed at [facility], [address] [place], [place] [address]    Culture PENDING    Report Status PENDING   Lactic acid, plasma     Status: Abnormal   Collection Time: 09/28/22 12:11 AM  Result Value Ref Range   Lactic Acid, Venous 6.5 (HH) 0.5 - 1.9 mmol/L    Comment: CRITICAL RESULT CALLED TO, READ BACK BY AND VERIFIED WITH CARTER,S AT 0118 ON 09/28/22 BY LUZOLOP Performed at [facility], [address], [place], [place] [address]   Lactic acid, plasma     Status: Abnormal   Collection Time: 09/28/22  2:10 AM  Result Value Ref Range   Lactic Acid, Venous 6.0 (HH) 0.5 - 1.9 mmol/L    Comment: CRITICAL VALUE NOTED. VALUE IS CONSISTENT WITH PREVIOUSLY REPORTED/CALLED VALUE Performed at  [facility], [address], [place], [place] [address]   Troponin I (High Sensitivity)     Status: None   Collection Time: 09/28/22  2:10 AM  Result Value Ref Range   Troponin I (High Sensitivity) 17 <18 ng/L    Comment: (NOTE) Elevated high sensitivity troponin I (hsTnI) values and significant  changes across serial measurements may suggest ACS but many other  chronic and acute conditions are known to elevate hsTnI results.  Refer to the "Links" section for chest pain algorithms and additional  guidance. Performed at [facility], [address], [place], [place] [address]   Comprehensive metabolic panel     Status: Abnormal   Collection Time: 09/28/22  5:30 AM  Result Value Ref Range   Sodium 133 (L) 135 - 145 mmol/L   Potassium 4.2 3.5 - 5.1 mmol/L   Chloride 93 (L) 98 - 111 mmol/L   CO2 25 22 - 32 mmol/L   Glucose, Bld 122 (H) 70 - 99 mg/dL    Comment: Glucose reference range applies only to samples taken after fasting for at least 8 hours.   BUN 29 (H) 6 - 20 mg/dL   Creatinine, Ser 0.98 0.61 - 1.24 mg/dL   Calcium 8.5 (L) 8.9 - 10.3 mg/dL   Total Protein 5.7 (L) 6.5 - 8.1 g/dL   Albumin 2.4 (L) 3.5 - 5.0 g/dL   AST 64 (H) 15 - 41 U/L   ALT 44 0 - 44 U/L   Alkaline Phosphatase 248 (H) 38 - 126 U/L   Total Bilirubin 3.0 (H) 0.3 - 1.2 mg/dL   GFR, Estimated >11 >91 mL/min    Comment: (NOTE) Calculated using the CKD-EPI Creatinine Equation (2021)    Anion gap 15 5 - 15    Comment: Performed at [facility], [address], [place], [place] [address]  Protime-INR     Status: Abnormal   Collection Time: 09/28/22  5:30 AM  Result Value Ref Range   Prothrombin Time 16.3 (H) 11.4 - 15.2 seconds   INR 1.3 (H) 0.8 - 1.2    Comment: (NOTE) INR goal varies based on device and disease states. Performed at [facility], [address] [place], [place] [address]   Ammonia     Status: None    Collection Time: 09/28/22  5:30 AM  Result Value Ref Range   Ammonia 34 9 - 35 umol/L    Comment: HEMOLYSIS AT THIS LEVEL MAY AFFECT RESULT Performed at [facility], [address], [place], [place] [address]   Lactic acid, plasma     Status: Abnormal   Collection Time: 09/28/22  5:30 AM  Result Value Ref Range   Lactic Acid, Venous 6.0 (HH) 0.5 - 1.9 mmol/L    Comment: CRITICAL VALUE NOTED. VALUE IS CONSISTENT WITH PREVIOUSLY REPORTED/CALLED VALUE Performed at [facility], [address] [place], [place] [address]   MRSA Next Gen by PCR, Nasal     Status: None   Collection Time: 09/28/22  6:09 AM   Specimen: Nasal Mucosa; Nasal Swab  Result Value Ref Range   MRSA by PCR Next Gen NOT DETECTED NOT DETECTED    Comment: (NOTE) The GeneXpert MRSA Assay (FDA approved for NASAL specimens  only), is one component of a comprehensive MRSA colonization surveillance program. It is not intended to diagnose MRSA infection nor to guide or monitor treatment for MRSA infections. Test performance is not FDA approved in patients less than 27 years old. Performed at [facility], [address], [place], [place] [address]   Glucose, capillary     Status: Abnormal   Collection Time: 09/28/22  6:13 AM  Result Value Ref Range   Glucose-Capillary 124 (H) 70 - 99 mg/dL    Comment: Glucose reference range applies only to samples taken after fasting for at least 8 hours.  CBC with Differential/Platelet     Status: Abnormal   Collection Time: 09/28/22  8:06 AM  Result Value Ref Range   WBC 18.7 (H) 4.0 - 10.5 K/uL   RBC 3.29 (L) 4.22 - 5.81 MIL/uL   Hemoglobin 10.3 (L) 13.0 - 17.0 g/dL   HCT 19.1 (L) 47.8 - 29.5 %   MCV 100.6 (H) 80.0 - 100.0 fL   MCH 31.3 26.0 - 34.0 pg   MCHC 31.1 30.0 - 36.0 g/dL   RDW 62.1 (H) 30.8 - 65.7 %   Platelets 224 150 - 400 K/uL   nRBC 0.0 0.0 - 0.2 %   Neutrophils Relative % 93 %   Neutro Abs 17.4 (H) 1.7 -  7.7 K/uL   Lymphocytes Relative 2 %   Lymphs Abs 0.3 (L) 0.7 - 4.0 K/uL   Monocytes Relative 4 %   Monocytes Absolute 0.8 0.1 - 1.0 K/uL   Eosinophils Relative 0 %   Eosinophils Absolute 0.0 0.0 - 0.5 K/uL   Basophils Relative 0 %   Basophils Absolute 0.0 0.0 - 0.1 K/uL   Immature Granulocytes 1 %   Abs Immature Granulocytes 0.23 (H) 0.00 - 0.07 K/uL    Comment: Performed at [facility], [address] [place], [place] [address]  Hemoglobin A1c     Status: None   Collection Time: 09/28/22  8:06 AM  Result Value Ref Range   Hgb A1c MFr Bld 5.0 4.8 - 5.6 %    Comment: (NOTE) Pre diabetes:          5.7%-6.4%  Diabetes:              >6.4%  Glycemic control for   <7.0% adults with diabetes    Mean Plasma Glucose 96.8 mg/dL    Comment: Performed at [facility], [address] [place], [place] [address]  Glucose, capillary     Status: Abnormal   Collection Time: 09/28/22  8:28 AM  Result Value Ref Range   Glucose-Capillary 126 (H) 70 - 99 mg/dL    Comment: Glucose reference range applies only to samples taken after fasting for at least 8 hours.  Osmolality     Status: None   Collection Time: 09/28/22  9:59 AM  Result Value Ref Range   Osmolality 294 275 - 295 mOsm/kg    Comment: Performed at [facility], [address] [place], [place] [address]  Protime-INR     Status: None   Collection Time: 09/28/22 10:58 AM  Result Value Ref Range   Prothrombin Time 15.0 11.4 - 15.2 seconds   INR 1.2 0.8 - 1.2    Comment: (NOTE) INR goal varies based on device and disease states. Performed at [facility], [address] [place], [place] [address]   APTT     Status: Abnormal   Collection Time:  09/28/22 10:58 AM  Result Value Ref Range   aPTT 23 (L) 24 - 36 seconds    Comment: Performed at [facility], [address] [place], [place] [address]  Lactic acid, plasma     Status: Abnormal   Collection Time:  09/28/22 10:58 AM  Result Value Ref Range   Lactic Acid, Venous 5.3 (HH) 0.5 - 1.9 mmol/L    Comment: CRITICAL VALUE NOTED. VALUE IS CONSISTENT WITH PREVIOUSLY REPORTED/CALLED VALUE Performed at [facility], [address], [place], [place] [address]   Glucose, capillary     Status: Abnormal   Collection Time: 09/28/22 12:04 PM  Result Value Ref Range   Glucose-Capillary 103 (H) 70 - 99 mg/dL    Comment: Glucose reference range applies only to samples taken after fasting for at least 8 hours.   Recent Results (from the past 240 hour(s))  Blood Culture (routine x 2)     Status: None (Preliminary result)   Collection Time: 10/02/2022 11:47 PM   Specimen: Porta Cath; Blood  Result Value Ref Range Status   Specimen Description PORTA CATH  Final   Special Requests   Final    BOTTLES DRAWN AEROBIC AND ANAEROBIC Blood Culture results may not be optimal due to an inadequate volume of blood received in culture bottles Performed at [facility], [address], [place], [place] [address]    Culture PENDING  Incomplete   Report Status PENDING  Incomplete  MRSA Next Gen by PCR, Nasal     Status: None   Collection Time: 09/28/22  6:09 AM   Specimen: Nasal Mucosa; Nasal Swab  Result Value Ref Range Status   MRSA by PCR Next Gen NOT DETECTED NOT DETECTED Final    Comment: (NOTE) The GeneXpert MRSA Assay (FDA approved for NASAL specimens only), is one component of a comprehensive MRSA colonization surveillance program. It is not intended to diagnose MRSA infection nor to guide or monitor treatment for MRSA infections. Test performance is not FDA approved in patients less than 48 years old. Performed at [facility], [address], [place], [place] [address]    Creatinine: Recent Labs    10/06/2022 2220 09/28/22 0530  CREATININE 0.77 0.70    Impression/Assessment:  CT cystogram ordered.  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  He would be a poor surgical candidate regardless of the outcome of the cystogram.  I would suggest palliative drainage at this time.  Formal plan will be developed following the results of his imaging.  Scherrie Bateman [place] 09/28/2022, 4:59 PM   Attestation:   I have independently seen the patient and reviewed the pertinent imaging and labs and concur with the recommendation.

## 2022-09-28 NOTE — Progress Notes (Signed)
PHARMACY NOTE:  ANTIMICROBIAL RENAL DOSAGE ADJUSTMENT  Current antimicrobial regimen includes a mismatch between antimicrobial dosage and estimated renal function.  As per policy approved by the Pharmacy & Therapeutics and Medical Executive Committees, the antimicrobial dosage will be adjusted accordingly.  Current antimicrobial dosage:  Cefepime 2g IV q8h  Indication: urosepsis  Renal Function: Estimated Creatinine Clearance: 58.3 mL/min (by C-G formula based on SCr of 0.7 mg/dL).    Antimicrobial dosage has been changed to:  Cefepime 2g IV q12h  Additional comments:   Thank you for allowing pharmacy to be a part of this patient's care.  Lynann Beaver PharmD, BCPS [facility] main pharmacy [phone] 09/28/2022 9:26 AM

## 2022-09-28 NOTE — Progress Notes (Signed)
A consult was received from an ED physician for vanc and cefepime per pharmacy dosing.  The patient's profile has been reviewed for ht/wt/allergies/indication/available labs.   A one time order has been placed for vanc 1gm and cefepime 2gm.    Further antibiotics/pharmacy consults should be ordered by admitting physician if indicated.                       Thank you, Arley Phenix RPh 09/28/2022, 12:19 AM

## 2022-09-29 ENCOUNTER — Inpatient Hospital Stay (HOSPITAL_COMMUNITY): Payer: Medicaid Other

## 2022-09-29 ENCOUNTER — Other Ambulatory Visit: Payer: Self-pay | Admitting: Hematology

## 2022-09-29 ENCOUNTER — Encounter (HOSPITAL_COMMUNITY): Payer: Self-pay | Admitting: Family Medicine

## 2022-09-29 DIAGNOSIS — K651 Peritoneal abscess: Secondary | ICD-10-CM | POA: Diagnosis not present

## 2022-09-29 DIAGNOSIS — N390 Urinary tract infection, site not specified: Secondary | ICD-10-CM

## 2022-09-29 DIAGNOSIS — N322 Vesical fistula, not elsewhere classified: Secondary | ICD-10-CM

## 2022-09-29 DIAGNOSIS — C20 Malignant neoplasm of rectum: Secondary | ICD-10-CM | POA: Diagnosis not present

## 2022-09-29 DIAGNOSIS — J189 Pneumonia, unspecified organism: Secondary | ICD-10-CM | POA: Diagnosis not present

## 2022-09-29 DIAGNOSIS — A419 Sepsis, unspecified organism: Secondary | ICD-10-CM | POA: Diagnosis not present

## 2022-09-29 LAB — URINE CULTURE

## 2022-09-29 LAB — COMPREHENSIVE METABOLIC PANEL
ALT: 321 U/L — ABNORMAL HIGH (ref 0–44)
AST: 542 U/L — ABNORMAL HIGH (ref 15–41)
Albumin: 2.4 g/dL — ABNORMAL LOW (ref 3.5–5.0)
Alkaline Phosphatase: 252 U/L — ABNORMAL HIGH (ref 38–126)
Anion gap: 17 — ABNORMAL HIGH (ref 5–15)
BUN: 31 mg/dL — ABNORMAL HIGH (ref 6–20)
CO2: 20 mmol/L — ABNORMAL LOW (ref 22–32)
Calcium: 8.4 mg/dL — ABNORMAL LOW (ref 8.9–10.3)
Chloride: 100 mmol/L (ref 98–111)
Creatinine, Ser: 0.66 mg/dL (ref 0.61–1.24)
GFR, Estimated: >60 mL/min (ref >60)
Glucose, Bld: 110 mg/dL — ABNORMAL HIGH (ref 70–99)
Potassium: 4.7 mmol/L (ref 3.5–5.1)
Sodium: 137 mmol/L (ref 135–145)
Total Bilirubin: 3.2 mg/dL — ABNORMAL HIGH (ref 0.3–1.2)
Total Protein: 5.6 g/dL — ABNORMAL LOW (ref 6.5–8.1)

## 2022-09-29 LAB — CBC
HCT: 39.8 % (ref 39.0–52.0)
Hemoglobin: 11.9 g/dL — ABNORMAL LOW (ref 13.0–17.0)
MCH: 31.9 pg (ref 26.0–34.0)
MCHC: 29.9 g/dL — ABNORMAL LOW (ref 30.0–36.0)
MCV: 106.7 fL — ABNORMAL HIGH (ref 80.0–100.0)
Platelets: 179 10*3/uL (ref 150–400)
RBC: 3.73 MIL/uL — ABNORMAL LOW (ref 4.22–5.81)
RDW: 20.6 % — ABNORMAL HIGH (ref 11.5–15.5)
WBC: 17 10*3/uL — ABNORMAL HIGH (ref 4.0–10.5)
nRBC: 0.2 % (ref 0.0–0.2)

## 2022-09-29 LAB — GLUCOSE, CAPILLARY
Glucose-Capillary: 101 mg/dL — ABNORMAL HIGH (ref 70–99)
Glucose-Capillary: 103 mg/dL — ABNORMAL HIGH (ref 70–99)
Glucose-Capillary: 108 mg/dL — ABNORMAL HIGH (ref 70–99)
Glucose-Capillary: 116 mg/dL — ABNORMAL HIGH (ref 70–99)
Glucose-Capillary: 119 mg/dL — ABNORMAL HIGH (ref 70–99)
Glucose-Capillary: 125 mg/dL — ABNORMAL HIGH (ref 70–99)
Glucose-Capillary: 80 mg/dL (ref 70–99)

## 2022-09-29 LAB — CULTURE, BLOOD (ROUTINE X 2): Culture: NO GROWTH

## 2022-09-29 LAB — TROPONIN I (HIGH SENSITIVITY)
Troponin I (High Sensitivity): 32 ng/L — ABNORMAL HIGH (ref <18)
Troponin I (High Sensitivity): 33 ng/L — ABNORMAL HIGH (ref <18)

## 2022-09-29 LAB — MAGNESIUM: Magnesium: 2.3 mg/dL (ref 1.7–2.4)

## 2022-09-29 MED ORDER — METOPROLOL TARTRATE 25 MG PO TABS
25.0000 mg | ORAL_TABLET | Freq: Two times a day (BID) | ORAL | Status: DC
  Administered 2022-09-29 – 2022-09-30 (×3): 25 mg via ORAL
  Filled 2022-09-29 (×3): qty 1

## 2022-09-29 MED ORDER — LABETALOL HCL 5 MG/ML IV SOLN: 5.0000 mg | INTRAVENOUS | Status: AC

## 2022-09-29 MED ORDER — LORAZEPAM 1 MG PO TABS: 1.0000 mg | ORAL_TABLET | Freq: Every evening | ORAL | Status: DC | PRN

## 2022-09-29 MED ORDER — LABETALOL HCL 5 MG/ML IV SOLN
INTRAVENOUS | Status: AC
  Administered 2022-09-29: 5 mg via INTRAVENOUS
  Filled 2022-09-29: qty 4

## 2022-09-29 MED ORDER — SODIUM CHLORIDE (PF) 0.9 % IJ SOLN
INTRAMUSCULAR | Status: AC
  Filled 2022-09-29: qty 50

## 2022-09-29 MED ORDER — SODIUM CHLORIDE 0.9 % IV SOLN
INTRAVENOUS | Status: AC
  Filled 2022-09-29: qty 250

## 2022-09-29 MED ORDER — BACLOFEN 10 MG PO TABS
10.0000 mg | ORAL_TABLET | Freq: Three times a day (TID) | ORAL | Status: DC
  Administered 2022-09-29 – 2022-09-30 (×4): 10 mg via ORAL
  Filled 2022-09-29 (×4): qty 1

## 2022-09-29 MED ORDER — GUAIFENESIN-DM 100-10 MG/5ML PO SYRP
5.0000 mL | ORAL_SOLUTION | ORAL | Status: DC | PRN
  Administered 2022-09-29 – 2022-09-30 (×2): 5 mL via ORAL
  Filled 2022-09-29 (×2): qty 10

## 2022-09-29 MED ORDER — IOHEXOL 300 MG/ML  SOLN
100.0000 mL | Freq: Once | INTRAMUSCULAR | Status: AC | PRN
  Administered 2022-09-29: 100 mL via INTRAVENOUS

## 2022-09-29 MED ORDER — ONDANSETRON HCL 4 MG/2ML IJ SOLN
4.0000 mg | Freq: Four times a day (QID) | INTRAMUSCULAR | Status: DC | PRN
  Administered 2022-09-29: 4 mg via INTRAVENOUS
  Filled 2022-09-29: qty 2

## 2022-09-29 MED ORDER — ASPIRIN 81 MG PO TBEC
81.0000 mg | DELAYED_RELEASE_TABLET | Freq: Every day | ORAL | Status: DC
  Administered 2022-09-29 – 2022-09-30 (×2): 81 mg via ORAL
  Filled 2022-09-29 (×2): qty 1

## 2022-09-29 MED ORDER — DEXAMETHASONE 2 MG PO TABS
2.0000 mg | ORAL_TABLET | Freq: Every day | ORAL | Status: DC
  Administered 2022-09-29 – 2022-09-30 (×2): 2 mg via ORAL
  Filled 2022-09-29 (×2): qty 1

## 2022-09-29 NOTE — Progress Notes (Signed)
Subjective: Patient upon entry with HR of 180-220.  Denies chest pain or SOB.  He does say he wants the IR drain in the presacral fluid collection.  EKG obtained showing STEMI.  RN and I got in contact with primary MD.  ROS: See above, otherwise other systems negative  Objective: Vital signs in last 24 hours: Temp:  [97.1 F (36.2 C)-97.7 F (36.5 C)] 97.1 F (36.2 C) (04/23 0400) Pulse Rate:  [78-106] 79 (04/23 0935) Resp:  [9-23] 19 (04/23 0935) BP: (101-147)/(67-111) 123/71 (04/23 0935) SpO2:  [86 %-100 %] 95 % (04/23 0935) Last BM Date :  (Colostomy)  Intake/Output from previous day: 04/22 0701 - 04/23 0700 In: 3509.6 [I.V.:3412.8; IV Piggyback:96.8] Out: 75 [Urine:75] Intake/Output this shift: Total I/O In: 250.1 [I.V.:250.1] Out: -   PE: Gen: NAD HR: tachy, irregular Lungs: effort nonlabored Abd: soft, peristomal hernia present.  NT  Lab Results:  Recent Labs    09/28/22 0806 09/29/22 0546  WBC 18.7* 17.0*  HGB 10.3* 11.9*  HCT 33.1* 39.8  PLT 224 179   BMET Recent Labs    09/28/22 0530 09/29/22 0546  NA 133* 137  K 4.2 4.7  CL 93* 100  CO2 25 20*  GLUCOSE 122* 110*  BUN 29* 31*  CREATININE 0.70 0.66  CALCIUM 8.5* 8.4*   PT/INR Recent Labs    09/28/22 0530 09/28/22 1058  LABPROT 16.3* 15.0  INR 1.3* 1.2   CMP     Component Value Date/Time   NA 137 09/29/2022 0546   K 4.7 09/29/2022 0546   CL 100 09/29/2022 0546   CO2 20 (L) 09/29/2022 0546   GLUCOSE 110 (H) 09/29/2022 0546   BUN 31 (H) 09/29/2022 0546   CREATININE 0.66 09/29/2022 0546   CREATININE 11.07 (HH) 07/20/2022 0910   CALCIUM 8.4 (L) 09/29/2022 0546   PROT 5.6 (L) 09/29/2022 0546   ALBUMIN 2.4 (L) 09/29/2022 0546   AST 542 (H) 09/29/2022 0546   AST 13 (L) 07/20/2022 0910   ALT 321 (H) 09/29/2022 0546   ALT 14 07/20/2022 0910   ALKPHOS 252 (H) 09/29/2022 0546   BILITOT 3.2 (H) 09/29/2022 0546   BILITOT 1.0 07/20/2022 0910   GFRNONAA >60 09/29/2022 0546    GFRNONAA 5 (L) 07/20/2022 0910   Lipase     Component Value Date/Time   LIPASE 30 07/21/2022 2226       Studies/Results: CT CYSTOGRAM PELVIS  Result Date: 09/29/2022 CLINICAL DATA:  Perirectal abscess with possible fistulous connection to bladder. EXAM: CT CYSTOGRAM (CT ABDOMEN AND PELVIS WITH CONTRAST) TECHNIQUE: Multi-detector CT imaging through the abdomen and pelvis was performed after dilute contrast had been introduced into the bladder for the purposes of performing CT cystography. RADIATION DOSE REDUCTION: This exam was performed according to the departmental dose-optimization program which includes automated exposure control, adjustment of the mA and/or kV according to patient size and/or use of iterative reconstruction technique. CONTRAST:  OMNIPAQUE IOHEXOL 300 MG/ML  SOLN COMPARISON:  09/28/2022. FINDINGS: Lower chest: The heart is normal in size and there is a trace pericardial effusion. Coronary artery calcifications are noted. The distal tip of a central venous catheter is seen in the right atrium. Patchy airspace disease is noted at the lung bases. There is a pulmonary nodule in the right lower lobe measuring 6 mm, axial image 22. Nodules are present in the left upper lobe measuring 3.3 and 1 cm, axial image 9. There is a 7 mm nodule  in the left lower lobe, axial image 13. Hepatobiliary: Multiple low-attenuation masses are noted in the liver, the largest measuring 10.1 cm, compatible with metastatic disease. Hyperdense material is present in the gallbladder, possible excreted contrast. No biliary ductal dilatation. Pancreas: Unremarkable. No pancreatic ductal dilatation or surrounding inflammatory changes. Spleen: Normal in size without focal abnormality. Adrenals/Urinary Tract: No adrenal nodule or mass. The kidneys enhance symmetrically. A cyst is noted in the upper pole of the right kidney. No renal calculus or hydronephrosis. There is diffuse bladder wall thickening and colic  catheter is in place. A tract is noted between the posterior bladder on the right and a collection in the presacral space measuring 3.1 x 1.8 cm, possibly representing the rectum and decreased in size from the prior exam, compatible with fistula. Stomach/Bowel: Stomach is within normal limits. There is evidence of partial colectomy with left lower quadrant colostomy and peristomal herniation of large and small bowel. No bowel obstruction, free air, or pneumatosis. The appendix is not seen. Diffuse soft tissue thickening is noted in the perirectal space and presacral space. Vascular/Lymphatic: Aortic atherosclerosis. Enlarged lymph node is present in the gastrohepatic ligament measuring up to 9 mm in short axis diameter. Reproductive: The prostate gland is not well delineated on exam. Other: No abdominopelvic ascites. Musculoskeletal: Degenerative changes are noted in the lumbar spine. No acute or suspicious osseous abnormality. IMPRESSION: 1. Fistulous communication of the urinary bladder with a collection in the perirectal space and possibly rectum. The collection is reduced in size from the previous exam, now measuring 3.1 x 1.8 cm. 2. Diffuse bladder wall thickening, possible infectious or inflammatory cystitis. 3. Rectal wall thickening with marked soft tissue prominence in the perirectal and presacral spaces, increased from the prior exam, concerning for residual/recurrent tumor. 4. Multiple hepatic masses and left upper and lower lobe pulmonary nodules, compatible with metastatic disease. 5. Airspace disease at the lung bases bilaterally, suggesting infection. 6. Postoperative changes in the pelvis with left lower quadrant colostomy and peristomal herniation of multiple loops of small and large bowel. 7. Coronary artery calcifications and aortic atherosclerosis. 8. Remaining incidental findings as described above. Electronically Signed   By: Thornell Sartorius M.D.   On: 09/29/2022 01:24   CT ABDOMEN PELVIS W  CONTRAST  Result Date: 09/28/2022 CLINICAL DATA:  Abdominal pain. EXAM: CT ABDOMEN AND PELVIS WITH CONTRAST TECHNIQUE: Multidetector CT imaging of the abdomen and pelvis was performed using the standard protocol following bolus administration of intravenous contrast. RADIATION DOSE REDUCTION: This exam was performed according to the departmental dose-optimization program which includes automated exposure control, adjustment of the mA and/or kV according to patient size and/or use of iterative reconstruction technique. CONTRAST:  OMNIPAQUE IOHEXOL 300 MG/ML  SOLN COMPARISON:  August 13, 2022 FINDINGS: Lower chest: Mild multifocal right middle lobe, left upper lobe and right lower lobe infiltrates are seen. A 3.3 cm x 2.1 cm lingular soft tissue mass and adjacent 9 mm diameter lung nodule are seen. Hepatobiliary: Innumerable heterogeneous low-attenuation liver masses of various sizes are seen within an enlarged liver. These are markedly increased in size and number when compared to the prior study. Multiple surgical clips are seen along the anterior aspect of the left lobe of the liver consistent with history of partial hepatectomy. The gallbladder is contracted. No gallstones, gallbladder wall thickening, or biliary dilatation. Pancreas: Unremarkable. No pancreatic ductal dilatation or surrounding inflammatory changes. Spleen: A 9 mm focus of parenchymal low attenuation is seen within the posteromedial aspect of a  heterogeneous appearing spleen. Adrenals/Urinary Tract: Adrenal glands are unremarkable. Kidneys are normal in size, without renal calculi or hydronephrosis. A 2.0 cm simple cyst is seen within the anteromedial aspect of the upper right kidney. A Foley catheter is seen within an empty urinary bladder. Stomach/Bowel: Stomach is within normal limits. Postoperative changes are seen with a subsequent left lower quadrant ostomy site. There is an associated large fat containing parastomal hernia. No  evidence of bowel wall thickening, distention, or inflammatory changes. Vascular/Lymphatic: Aortic atherosclerosis. A 1.5 cm lymph node is seen within the region anterior to the suprarenal abdominal aorta and posterior aspect of the left lobe of the liver (axial CT image 30, CT series 2). This represents a new finding. Reproductive: The prostate gland is poorly visualized. Other: A 5.3 cm x 3.5 cm x 6.9 cm complex abscess is seen within the posterior aspect of the pelvis. Is within the presacral region and extends from the region posterior to the urinary bladder to the rectum. Marked severity presacral inflammatory fat stranding is also seen. A fistulous connection to the urinary bladder cannot be excluded. Musculoskeletal: Mild degenerative changes are seen within the lower lumbar spine. IMPRESSION: 1. 5.3 cm x 3.5 cm x 6.9 cm complex abscess within the posterior aspect of the pelvis, as described above. A fistulous connection to the urinary bladder cannot be excluded. 2. Mild multifocal right middle lobe, left upper lobe and right lower lobe infiltrates. 3. 3.3 cm x 2.1 cm lingular soft tissue mass and adjacent 9 mm diameter lung nodule, consistent with metastatic disease. 4. Innumerable heterogeneous low-attenuation liver masses of various sizes, markedly increased in size and number when compared to the prior study, consistent with worsening metastatic disease. 5. Postoperative changes with a left lower quadrant ostomy site. 6. Large fat containing parastomal hernia. 7. Aortic atherosclerosis. Aortic Atherosclerosis (ICD10-I70.0). Electronically Signed   By: Aram Candela M.D.   On: 09/28/2022 02:42   DG Chest Port 1 View  Result Date: 09/28/2022 CLINICAL DATA:  Metastatic colon cancer EXAM: PORTABLE CHEST 1 VIEW COMPARISON:  08/16/2022 FINDINGS: The lungs are well expanded. Previously noted diffuse airspace infiltrate has largely resolved though nodular opacities persist within the peripheral upper lung  zones bilaterally. Pulmonary mass is seen at the left lung base partially silhouetting the cardiac border corresponding to the lobulated pulmonary mass within the lingula seen on CT examination of 08/10/2022. No pneumothorax or pleural effusion. Right internal jugular chest port tip noted within the superior cavoatrial junction. Cardiac size within normal limits. No acute bone abnormality. IMPRESSION: 1. Near complete resolution of previously noted diffuse airspace infiltrate. Persistent nodular opacities within the peripheral upper lung zones bilaterally. 2. Left basilar pulmonary mass, grossly stable since CT examination of 08/10/2022. Electronically Signed   By: Helyn Numbers M.D.   On: 09/28/2022 01:11   CT HEAD WO CONTRAST ( )  Result Date: 09/20/2022 CLINICAL DATA:  Delirium. EXAM: CT HEAD WITHOUT CONTRAST TECHNIQUE: Contiguous axial images were obtained from the base of the skull through the vertex without intravenous contrast. RADIATION DOSE REDUCTION: This exam was performed according to the departmental dose-optimization program which includes automated exposure control, adjustment of the mA and/or kV according to patient size and/or use of iterative reconstruction technique. COMPARISON:  Head CT 08/10/2022 FINDINGS: Brain: Near complete of the previous postoperative pneumocephalus and blood products. Encephalomalacia in the left frontal lobe. Expected postsurgical changes in the overlying dura. The previous midline shift has resolved. No evidence of acute intracranial hemorrhage or ischemia no intracranial  mass on this unenhanced exam. No hydrocephalus. Vascular: Atherosclerosis of skullbase vasculature without hyperdense vessel or abnormal calcification. Skull: Prior left frontal craniotomy. No evidence of a calvarial lesion. Sinuses/Orbits: Similar fluid/mucosal thickening in right side of sphenoid sinus. Paranasal sinuses are otherwise clear. No mastoid effusion. Other: No evidence of scalp  fluid collection. IMPRESSION: 1. No acute intracranial abnormality. 2. Encephalomalacia in the left frontal lobe. Near complete resolution of previous postoperative pneumocephalus and blood products. Expected postsurgical changes in the overlying dura. Electronically Signed   By: Narda Rutherford M.D.   On: Oct 12, 2022 23:52    Anti-infectives: Anti-infectives (From admission, onward)    Start     Dose/Rate Route Frequency Ordered Stop   09/28/22 1400  cefTRIAXone (ROCEPHIN) 2 g in sodium chloride 0.9 % 100 mL IVPB        2 g 200 mL/hr over 30 Minutes Intravenous Every 24 hours 09/28/22 0932     09/28/22 1200  vancomycin (VANCOREADY) IVPB 1250 mg/250 mL  Status:  Discontinued        1,250 mg 166.7 mL/hr over 90 Minutes Intravenous Every 12 hours 09/28/22 0427 09/28/22 0932   09/28/22 1200  ceFEPIme (MAXIPIME) 2 g in sodium chloride 0.9 % 100 mL IVPB  Status:  Discontinued        2 g 200 mL/hr over 30 Minutes Intravenous Every 12 hours 09/28/22 0915 09/28/22 0932   09/28/22 1000  ceFEPIme (MAXIPIME) 2 g in sodium chloride 0.9 % 100 mL IVPB  Status:  Discontinued        2 g 200 mL/hr over 30 Minutes Intravenous Every 8 hours 09/28/22 0427 09/28/22 0915   09/28/22 0015  ceFEPIme (MAXIPIME) 2 g in sodium chloride 0.9 % 100 mL IVPB        2 g 200 mL/hr over 30 Minutes Intravenous  Once 09/28/22 0009 09/28/22 0155   09/28/22 0015  metroNIDAZOLE (FLAGYL) IVPB 500 mg        500 mg 100 mL/hr over 60 Minutes Intravenous  Once 09/28/22 0009 09/28/22 0134   09/28/22 0015  vancomycin (VANCOCIN) IVPB 1000 mg/200 mL premix        1,000 mg 200 mL/hr over 60 Minutes Intravenous  Once 09/28/22 0009 09/28/22 0232        Assessment/Plan 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  I spoke to daughter and she seems to really understand the situation her dad is in.  She states that his confusion has drastically  worsened and he doesn't fully understand how bad his situation is despite multiple attempts to help him understand. -as of right now patient has elected for an IR drain in this presacral collection.  Spoke with Sheria Lang, urology NP, about this as he was offered PCNs vs this drain. -unfortunately patient may be having an acute MI.  I discussed the case with Dr. Rhona Leavens at the bedside.  This patient ultimately has end-stage rectal cancer.  If he gets this drain it is more for palliation to prevent continuous urine leakage from his rectum and would stay in indefinitely.  If now on top of this he is having an MI, this patient really needs to consider transition to comfort care. -would be beneficial for Dr. Candise Che to come speak to patient and family and to guide medical team on his thoughts for a plan moving forward. -there is nothing to offer this  patient otherwise, surgically, at this time.  Dr. Rhona Leavens  would like to hold off on order for a drain right now until palliative care can come see the patient.   -we will sign off at this time as discussed with him.  FEN - NPO, IVFs VTE - ok for prophylaxis from surgical standpoint ID - [email or web address]  I reviewed Consultant urology notes, hospitalist notes, last 24 h vitals and pain scores, last 48 h intake and output, last 24 h labs and trends, and last 24 h imaging results.   LOS: 1 day    Letha Cape , [facility] 09/29/2022, 10:36 AM Please see Amion for pager number during day hours 7:00am-4:30pm or 7:00am -11:30am on weekends

## 2022-09-29 NOTE — Progress Notes (Signed)
Patient's HR up to 180-190s and sustaining. Bp stable. Patient denies chest pain, states he "feels fine." Surgery PA at bedside. Dr. Rhona Leavens paged. EKG performed. Upon arrival to patient's room, Dr. Rhona Leavens ordered  stat of IV labetalol. Labetalol administered. Patient converted back to sinus rhythm shortly after.

## 2022-09-29 NOTE — Progress Notes (Signed)
HEMATOLOGY/ONCOLOGY INPATIENT PROGRESS NOTE  Date of Service: 09/29/2022  Inpatient Attending: Jerald Kief, MD   SUBJECTIVE  James Coffey is a 52 y.o. male was seen in medical oncology followup for goals of care discussion and  management of metastatic rectal cancer. Patient is seen inpatient today after being admitted on 09/28/22 for sepsis treatment. He presented to the ED with confusion and labs showed lactic acid 6.5, WBCs 23.4, T. bili 3.2, potassium 5.2, CO2 18. His CT chest showed pneumonia and pelvic abscess. Surgery consulted and advised against draining abscess. I spoke with patient's daughter, James Coffey, on the phone today regarding his status. She states that he has been very confused and does not understand his own health status or what full code interventions actually entail. We discussed in his GOC and current situation in details. She has an appointment with palliative care tomorrow as well. Patient notes significant cough and poor appetite with weight loss.  OBJECTIVE: NAD  PHYSICAL EXAMINATION: Vitals:   09/29/22 1100 09/29/22 1158 09/29/22 1200 09/29/22 1300  BP: 102/83  96/78 (!) 116/91  Pulse: 88 85 86 85  Resp: 15   19  Temp:   98 F (36.7 C)   TempSrc:   Oral   SpO2: 94% 96% 94% 95%  Weight:      Height:       Filed Weights   10/03/2022 2135 09/28/22 0600  Weight: 80 kg 38.6 kg   Body mass index is 12.57 kg/m.  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   pt denies   Cancer    rectal  cancer with mets to liver   Tobacco abuse     SURGICAL HISTORY: Past Surgical History:  Procedure Laterality Date   APPLICATION OF CRANIAL NAVIGATION N/A 08/05/2022   Procedure: APPLICATION OF CRANIAL NAVIGATION;  Surgeon: Coletta Memos, MD;  Location: [facility] OR;  Service: Neurosurgery;  Laterality: N/A;   CRANIOTOMY Left 08/05/2022   Procedure: Left frontal craniotomy for resection of tumor with Stealth;  Surgeon: Coletta Memos, MD;  Location: [facility] OR;  Service: Neurosurgery;  Laterality: Left;  RM 19 to follow   HERNIA REPAIR     INCISION AND DRAINAGE ABSCESS N/A 12/01/2020   Procedure: INCISION AND DRAINAGE ABSCESS;  Surgeon: Karie Soda, MD;  Location: [facility]S;  Service: General;  Laterality: N/A;   INCISION AND DRAINAGE ABSCESS N/A 12/04/2020   Procedure: INCISION AND DRAINAGE FOURNIERS GANGRENE;  Surgeon: Luretha Murphy, MD;  Location: [facility];  Service: General;  Laterality: N/A;   INCISION AND DRAINAGE PERIRECTAL ABSCESS N/A 11/30/2020   Procedure: INCISION AND DEBRIDEMENT OF PERIRECTAL ABSCESS AND RECTAL WALL BIOPSIES;  Surgeon: Karie Soda, MD;  Location: [facility];  Service: General;  Laterality: N/A;   INGUINAL HERNIA REPAIR Right 2007   pt denies   IR ANGIOGRAM SELECTIVE EACH ADDITIONAL VESSEL  09/01/2022   IR ANGIOGRAM SELECTIVE EACH ADDITIONAL VESSEL  09/01/2022   IR ANGIOGRAM SELECTIVE EACH ADDITIONAL VESSEL  09/16/2022   IR ANGIOGRAM VISCERAL SELECTIVE  09/01/2022   IR ANGIOGRAM VISCERAL SELECTIVE  09/16/2022   IR EMBO TUMOR  ORGAN ISCHEMIA INFARCT INC GUIDE ROADMAPPING  09/16/2022   IR IMAGING GUIDED PORT INSERTION  05/07/2021   IR RADIOLOGIST EVAL & MGMT  08/27/2021   IR RADIOLOGIST EVAL & MGMT  09/12/2021   IR RADIOLOGIST EVAL & MGMT  11/10/2021   IR RADIOLOGIST EVAL & MGMT  01/05/2022   IR SINUS/FIST TUBE CHK-NON GI  04/28/2022   IR US GUIDE VASC ACCESS RIGHT  09/01/2022   IR US GUIDE VASC ACCESS RIGHT  09/16/2022   LAPAROSCOPIC LOOP COLOSTOMY N/A 12/13/2020   Procedure:  DIAGNOSTIC LAPAROSCOPY WITH  OPEN DESCENDING LOOP COLOSTOMY;  Surgeon: Darnell Level, MD;  Location: [facility];  Service: General;  Laterality: N/A;   LAPAROSCOPY N/A 08/28/2021   Procedure: LAPAROSCOPY DIAGNOSTIC;  Surgeon: Fritzi Mandes, MD;  Location[facility];  Service: General;  Laterality: N/A;   OPEN PARTIAL HEPATECTOMY  Left 08/28/2021   Procedure: OPEN LEFT LATERAL HEPATECTOMY WITH INTRAOPERATIVE ULTRASOUND;  Surgeon: Fritzi Mandes, MD;  Location: [facility] OR;  Service: General;  Laterality: Left;   RADIOLOGY WITH ANESTHESIA N/A 10/08/2021   Procedure: MICROWAVE ABLATION;  Surgeon: Irish Lack, MD;  Location: [facility];  Service: Radiology;  Laterality: N/A;   RECTAL SURGERY     XI ROBOTIC ASSISTED PARASTOMAL HERNIA REPAIR N/A 02/04/2022   Procedure: ROBOTIC ABDOMINAL PERINEAL RESECTION, PARASTOMAL HERNIA REPAIR, BILATERAL TAP BLOCK;  Surgeon: Romie Levee, MD;  Location: [facility];  Service: General;  Laterality: N/A;    SOCIAL HISTORY: Social History   Socioeconomic History   Marital status: Married    Spouse name: Not on file   Number of children: Not on file   Years of education: Not on file   Highest education level: Not on file  Occupational History   Occupation: truck driver  Tobacco Use   Smoking status: Every Day    Packs/day: .25    Types: Cigarettes   Smokeless tobacco: Never  Vaping Use   Vaping Use: Never used  Substance and Sexual Activity   Alcohol use: No   Drug use: No   Sexual activity: Never  Other Topics Concern   Not on file  Social History Narrative   From [place].  Came to the [place] in 2000.   Social Determinants of Health   Financial Resource Strain: Not on file  Food Insecurity: No Food Insecurity (09/28/2022)   Hunger Vital Sign    Worried About Running Out of Food in the Last Year: Never true    Ran Out of Food in the Last Year: Never true  Transportation Needs: No Transportation Needs (09/28/2022)   PRAPARE - Scientist, research (physical sciences) (Medical): No    Lack of Transportation (Non-Medical): No  Physical Activity: Not on file  Stress: Not on file  Social Connections: Not on file  Intimate Partner Violence: Not At Risk (09/28/2022)   Humiliation, Afraid, Rape, and Kick questionnaire    Fear of Current or Ex-Partner: No    Emotionally Abused: No    Physically Abused: No    Sexually Abused: No    FAMILY HISTORY: Family History  Problem Relation Age of Onset   CAD Neg Hx    Inflammatory bowel disease Neg Hx     ALLERGIES:  is allergic to hydrocodone and pork-derived products.  MEDICATIONS:  Scheduled Meds:  aspirin EC  81 mg Oral Daily   baclofen  10 mg Oral TID   Chlorhexidine Gluconate Cloth  6 each Topical Q0600   dexamethasone  2 mg Oral  Daily   insulin aspart  0-15 Units Subcutaneous Q4H   metoprolol tartrate  25 mg Oral BID   Continuous Infusions:  sodium chloride 150 mL/hr at 09/29/22 1513   cefTRIAXone (ROCEPHIN)  IV Stopped (09/29/22 1501)   PRN Meds:.HYDROmorphone (DILAUDID) injection, LORazepam, ondansetron (ZOFRAN) IV, mouth rinse  REVIEW OF SYSTEMS:   10 Point review of Systems was done is negative except as noted above.   LABORATORY DATA:  I have reviewed the data as listed    Latest Ref Rng & Units 09/29/2022    5:46 AM 09/28/2022    8:06 AM 09/30/2022   10:20 PM  CBC  WBC 4.0 - 10.5 K/uL 17.0  18.7  23.4   Hemoglobin 13.0 - 17.0 g/dL 11.9  14.7  82.9   Hematocrit 39.0 - 52.0 % 39.8  33.1  41.9   Platelets 150 - 400 K/uL 179  224  323       Latest Ref Rng & Units 09/29/2022    5:46 AM 09/28/2022    5:30 AM 09/30/2022   10:20 PM  CMP  Glucose 70 - 99 mg/dL 562  130  865   BUN 6 - 20 mg/dL 31  29  32   Creatinine 0.61 - 1.24 mg/dL 7.84  6.96  2.95   Sodium 135 - 145 mmol/L 137  133  130   Potassium 3.5 - 5.1 mmol/L 4.7  4.2  5.2   Chloride 98 - 111 mmol/L 100  93  95   CO2 22 - 32 mmol/L Calcium 8.9 - 10.3 mg/dL 8.4  8.5  9.0   Total Protein 6.5 - 8.1  g/dL 5.6  5.7  6.7   Total Bilirubin 0.3 - 1.2 mg/dL 3.2  3.0  3.2   Alkaline Phos 38 - 126 U/L 252  248  331   AST 15 - 41 U/L 542  64  51   ALT 0 - 44 U/L 321  44  40     RADIOGRAPHIC STUDIES: I have personally reviewed the radiological images as listed and agreed with the findings in the report. CT CYSTOGRAM PELVIS  Result Date: 09/29/2022 CLINICAL DATA:  Perirectal abscess with possible fistulous connection to bladder. EXAM: CT CYSTOGRAM (CT ABDOMEN AND PELVIS WITH CONTRAST) TECHNIQUE: Multi-detector CT imaging through the abdomen and pelvis was performed after dilute contrast had been introduced into the bladder for the purposes of performing CT cystography. RADIATION DOSE REDUCTION: This exam was performed according to the departmental dose-optimization program which includes automated exposure control, adjustment of the mA and/or kV according to patient size and/or use of iterative reconstruction technique. CONTRAST:  OMNIPAQUE IOHEXOL 300 MG/ML  SOLN COMPARISON:  09/28/2022. FINDINGS: Lower chest: The heart is normal in size and there is a trace pericardial effusion. Coronary artery calcifications are noted. The distal tip of a central venous catheter is seen in the right atrium. Patchy airspace disease is noted at the lung bases. There is a pulmonary nodule in the right lower lobe measuring 6 mm, axial image 22. Nodules are present in the left upper lobe measuring 3.3 and 1 cm, axial image 9. There is a 7 mm nodule in the left lower lobe, axial image 13. Hepatobiliary: Multiple low-attenuation masses are noted in the liver, the largest measuring 10.1 cm, compatible with metastatic disease. Hyperdense material is present in the gallbladder, possible excreted contrast. No biliary ductal dilatation. Pancreas: Unremarkable. No pancreatic ductal dilatation or surrounding inflammatory  changes. Spleen: Normal in size without focal abnormality. Adrenals/Urinary Tract: No adrenal nodule or mass. The  kidneys enhance symmetrically. A cyst is noted in the upper pole of the right kidney. No renal calculus or hydronephrosis. There is diffuse bladder wall thickening and colic catheter is in place. A tract is noted between the posterior bladder on the right and a collection in the presacral space measuring 3.1 x 1.8 cm, possibly representing the rectum and decreased in size from the prior exam, compatible with fistula. Stomach/Bowel: Stomach is within normal limits. There is evidence of partial colectomy with left lower quadrant colostomy and peristomal herniation of large and small bowel. No bowel obstruction, free air, or pneumatosis. The appendix is not seen. Diffuse soft tissue thickening is noted in the perirectal space and presacral space. Vascular/Lymphatic: Aortic atherosclerosis. Enlarged lymph node is present in the gastrohepatic ligament measuring up to 9 mm in short axis diameter. Reproductive: The prostate gland is not well delineated on exam. Other: No abdominopelvic ascites. Musculoskeletal: Degenerative changes are noted in the lumbar spine. No acute or suspicious osseous abnormality. IMPRESSION: 1. Fistulous communication of the urinary bladder with a collection in the perirectal space and possibly rectum. The collection is reduced in size from the previous exam, now measuring 3.1 x 1.8 cm. 2. Diffuse bladder wall thickening, possible infectious or inflammatory cystitis. 3. Rectal wall thickening with marked soft tissue prominence in the perirectal and presacral spaces, increased from the prior exam, concerning for residual/recurrent tumor. 4. Multiple hepatic masses and left upper and lower lobe pulmonary nodules, compatible with metastatic disease. 5. Airspace disease at the lung bases bilaterally, suggesting infection. 6. Postoperative changes in the pelvis with left lower quadrant colostomy and peristomal herniation of multiple loops of small and large bowel. 7. Coronary artery calcifications and  aortic atherosclerosis. 8. Remaining incidental findings as described above. Electronically Signed   By: Thornell Sartorius M.D.   On: 09/29/2022 01:24   CT ABDOMEN PELVIS W CONTRAST  Result Date: 09/28/2022 CLINICAL DATA:  Abdominal pain. EXAM: CT ABDOMEN AND PELVIS WITH CONTRAST TECHNIQUE: Multidetector CT imaging of the abdomen and pelvis was performed using the standard protocol following bolus administration of intravenous contrast. RADIATION DOSE REDUCTION: This exam was performed according to the departmental dose-optimization program which includes automated exposure control, adjustment of the mA and/or kV according to patient size and/or use of iterative reconstruction technique. CONTRAST:  OMNIPAQUE IOHEXOL 300 MG/ML  SOLN COMPARISON:  August 13, 2022 FINDINGS: Lower chest: Mild multifocal right middle lobe, left upper lobe and right lower lobe infiltrates are seen. A 3.3 cm x 2.1 cm lingular soft tissue mass and adjacent 9 mm diameter lung nodule are seen. Hepatobiliary: Innumerable heterogeneous low-attenuation liver masses of various sizes are seen within an enlarged liver. These are markedly increased in size and number when compared to the prior study. Multiple surgical clips are seen along the anterior aspect of the left lobe of the liver consistent with history of partial hepatectomy. The gallbladder is contracted. No gallstones, gallbladder wall thickening, or biliary dilatation. Pancreas: Unremarkable. No pancreatic ductal dilatation or surrounding inflammatory changes. Spleen: A 9 mm focus of parenchymal low attenuation is seen within the posteromedial aspect of a heterogeneous appearing spleen. Adrenals/Urinary Tract: Adrenal glands are unremarkable. Kidneys are normal in size, without renal calculi or hydronephrosis. A 2.0 cm simple cyst is seen within the anteromedial aspect of the upper right kidney. A Foley catheter is seen within an empty urinary bladder. Stomach/Bowel: Stomach is within  normal limits. Postoperative changes are seen with a subsequent left lower quadrant ostomy site. There is an associated large fat containing parastomal hernia. No evidence of bowel wall thickening, distention, or inflammatory changes. Vascular/Lymphatic: Aortic atherosclerosis. A 1.5 cm lymph node is seen within the region anterior to the suprarenal abdominal aorta and posterior aspect of the left lobe of the liver (axial CT image 30, CT series 2). This represents a new finding. Reproductive: The prostate gland is poorly visualized. Other: A 5.3 cm x 3.5 cm x 6.9 cm complex abscess is seen within the posterior aspect of the pelvis. Is within the presacral region and extends from the region posterior to the urinary bladder to the rectum. Marked severity presacral inflammatory fat stranding is also seen. A fistulous connection to the urinary bladder cannot be excluded. Musculoskeletal: Mild degenerative changes are seen within the lower lumbar spine. IMPRESSION: 1. 5.3 cm x 3.5 cm x 6.9 cm complex abscess within the posterior aspect of the pelvis, as described above. A fistulous connection to the urinary bladder cannot be excluded. 2. Mild multifocal right middle lobe, left upper lobe and right lower lobe infiltrates. 3. 3.3 cm x 2.1 cm lingular soft tissue mass and adjacent 9 mm diameter lung nodule, consistent with metastatic disease. 4. Innumerable heterogeneous low-attenuation liver masses of various sizes, markedly increased in size and number when compared to the prior study, consistent with worsening metastatic disease. 5. Postoperative changes with a left lower quadrant ostomy site. 6. Large fat containing parastomal hernia. 7. Aortic atherosclerosis. Aortic Atherosclerosis (ICD10-I70.0). Electronically Signed   By: Aram Candela M.D.   On: 09/28/2022 02:42   DG Chest Port 1 View  Result Date: 09/28/2022 CLINICAL DATA:  Metastatic colon cancer EXAM: PORTABLE CHEST 1 VIEW COMPARISON:  08/16/2022  FINDINGS: The lungs are well expanded. Previously noted diffuse airspace infiltrate has largely resolved though nodular opacities persist within the peripheral upper lung zones bilaterally. Pulmonary mass is seen at the left lung base partially silhouetting the cardiac border corresponding to the lobulated pulmonary mass within the lingula seen on CT examination of 08/10/2022. No pneumothorax or pleural effusion. Right internal jugular chest port tip noted within the superior cavoatrial junction. Cardiac size within normal limits. No acute bone abnormality. IMPRESSION: 1. Near complete resolution of previously noted diffuse airspace infiltrate. Persistent nodular opacities within the peripheral upper lung zones bilaterally. 2. Left basilar pulmonary mass, grossly stable since CT examination of 08/10/2022. Electronically Signed   By: Helyn Numbers M.D.   On: 09/28/2022 01:11   CT HEAD WO CONTRAST ( )  Result Date: 10/06/2022 CLINICAL DATA:  Delirium. EXAM: CT HEAD WITHOUT CONTRAST TECHNIQUE: Contiguous axial images were obtained from the base of the skull through the vertex without intravenous contrast. RADIATION DOSE REDUCTION: This exam was performed according to the departmental dose-optimization program which includes automated exposure control, adjustment of the mA and/or kV according to patient size and/or use of iterative reconstruction technique. COMPARISON:  Head CT 08/10/2022 FINDINGS: Brain: Near complete of the previous postoperative pneumocephalus and blood products. Encephalomalacia in the left frontal lobe. Expected postsurgical changes in the overlying dura. The previous midline shift has resolved. No evidence of acute intracranial hemorrhage or ischemia no intracranial mass on this unenhanced exam. No hydrocephalus. Vascular: Atherosclerosis of skullbase vasculature without hyperdense vessel or abnormal calcification. Skull: Prior left frontal craniotomy. No evidence of a calvarial lesion.  Sinuses/Orbits: Similar fluid/mucosal thickening in right side of sphenoid sinus. Paranasal sinuses are otherwise clear. No mastoid effusion. Other: No  evidence of scalp fluid collection. IMPRESSION: 1. No acute intracranial abnormality. 2. Encephalomalacia in the left frontal lobe. Near complete resolution of previous postoperative pneumocephalus and blood products. Expected postsurgical changes in the overlying dura. Electronically Signed   By: Narda Rutherford M.D.   On: 10/19/2022 23:52   IR Angiogram Visceral Selective  Result Date: 09/17/2022 INDICATION: Metastatic colorectal carcinoma to the liver. Presenting for Yttrium-90 radioembolization treatment to the right lobe of the liver. EXAM: 1. ULTRASOUND GUIDANCE FOR VASCULAR ACCESS OF THE RIGHT COMMON FEMORAL ARTERY 2. SELECTIVE ARTERIOGRAPHY OF THE PROPER HEPATIC ARTERY 3. ADDITIONAL SELECTIVE ARTERIOGRAPHY OF THE RIGHT HEPATIC ARTERY 4. TRANSCATHETER EMBOLIZATION OF THE LIVER TO TREAT METASTATIC CARCINOMA WITH Y-90 RADIOEMBOLIZATION MEDICATIONS: 2 g IV Mefoxin, 4 mg IV Zofran, 8 mg IV Decadron and 40 mg IV Protonix ANESTHESIA/SEDATION: Moderate (conscious) sedation was employed during this procedure. A total of Versed 2.0 mg and Fentanyl 100 mcg was administered intravenously. Moderate Sedation Time: 46 minutes. The patient's level of consciousness and vital signs were monitored continuously by radiology nursing throughout the procedure under my direct supervision. CONTRAST:  50 mL Omnipaque 300 FLUOROSCOPY TIME:  Radiation Exposure Index (as provided by the fluoroscopic device): 56 mGy Kerma COMPLICATIONS: None immediate. PROCEDURE: Informed consent was obtained from the patient following explanation of the procedure, risks, benefits and alternatives. The patient understands, agrees and consents for the procedure. All questions were addressed. A time out was performed prior to the initiation of the procedure. Maximal barrier sterile technique utilized  including caps, mask, sterile gowns, sterile gloves, large sterile drape, hand hygiene, and Betadine prep. Ultrasound was used to confirm patency of the right common femoral artery. A permanent ultrasound image was saved and recorded. Ultrasound-guided access of the right common femoral artery was performed with a micropuncture set. A 5 French sheath was placed over a guidewire. A 5 French Cobra catheter was introduced and advanced through the abdominal aorta. This was used to selectively catheterize the celiac axis and further selectively catheterize the common hepatic artery. Selective arteriography was then performed through the 5 French catheter at the level of the proper hepatic artery just beyond the gastroduodenal artery. Repeat selective arteriography was necessary to assess arterial patency and perfusion to hepatic metastatic lesions prior to radioembolization. A microcatheter was then advanced through the 5 French catheter to the level of the right hepatic artery. Selective arteriography was performed through the microcatheter and the catheter positioned at an appropriate level for radioembolization. Radioembolization was then performed through a dedicated infusion system with delivery Y-90 SIR Spheres. The radioactive microspheres were slowly delivered in diluted contrast material through the dedicated delivery system under fluoroscopic guidance. After complete delivery of the dose, the catheter and microcatheter were withdrawn from the body and discarded appropriately. Oblique arteriography was performed through the 5 French sheath. Arteriotomy closure was performed with the Angio-Seal device. FINDINGS: Proper hepatic and right hepatic arteriography demonstrates stable appearance of hepatic arterial supply since prior arteriography on 09/01/2022 and ascending branches of the right hepatic artery supplying two large areas of metastatic disease in the superior right lobe of the liver. The microcatheter was  positioned just proximal to ascending branches of the right hepatic artery. A full dose of 26 millicuries of microspheres was able to be delivered to the right lobe. IMPRESSION: Yttrium 90 radioembolization to the right lobe of the liver with Y-90 SIR Spheres. A dose of 26 millicuries of microspheres was delivered via the right hepatic artery. Electronically Signed   By: Sherrine Maples  Fredia Sorrow M.D.   On: 09/17/2022 08:36   NM Y90 LIVER SPECT THERAPY  Result Date: 09/16/2022 CLINICAL DATA:  Colorectal carcinoma with unresectable liver metastasis. Yttrium 90 radio embolization to the RIGHT hepatic lobe. EXAM: NUCLEAR MEDICINE SPECIAL MED RAD PHYSICS CONS; NUCLEAR MEDICINE RADIO PHARM THERAPY INTRA ARTERIAL; NUCLEAR MEDICINE TREATMENT PROCEDURE; NUCLEAR MEDICINE LIVER SCAN TECHNIQUE: In conjunction with the interventional radiologist a Y- Microsphere dose was calculated utilizing body surface area formulation. Additional #D volumetric calculations and partition methodology applied. 10% dose reduction due to elevated bilirubin. Calculated dose equal 26.8 mCi. Pre therapy MAA liver SPECT scan and CTA were evaluated. Utilizing a microcatheter system, the hepatic artery was selected and Y-90 microspheres were delivered in fractionated aliquots. Radiopharmaceutical was delivered by the interventional radiologist and nuclear radiologist. The patient tolerated procedure well. No adverse effects were noted. Bremsstrahlung planar and SPECT imaging of the abdomen following intrahepatic arterial delivery of Y-90 microsphere was performed. RADIOPHARMACEUTICALS:  28.6 millicurie yttrium 90 microspheres COMPARISON:  None Available. FINDINGS: Y - 90 microspheres therapy as above. First therapy the right hepatic lobe. Bremsstrahlung planar and SPECT imaging of the abdomen following intrahepatic arterial delivery of Y-34microsphere demonstrates radioactivity localized to the RIGHT hepatic lobe. No evidence of extrahepatic activity.  IMPRESSION: Successful Y - 90 microsphere delivery for treatment of unresectable liver metastasis. First therapy to the RIGHT lobe. Bremssstrahlung scan demonstrates activity localized to RIGHT hepatic lobe with no extrahepatic activity identified. Electronically Signed   By: Genevive Bi M.D.   On: 09/16/2022 13:01   IR EMBO TUMOR ORGAN ISCHEMIA INFARCT INC GUIDE ROADMAPPING  Result Date: 09/16/2022 INDICATION: Metastatic colorectal carcinoma to the liver. Presenting for Yttrium-90 radioembolization treatment to the right lobe of the liver. EXAM: 1. ULTRASOUND GUIDANCE FOR VASCULAR ACCESS OF THE RIGHT COMMON FEMORAL ARTERY 2. SELECTIVE ARTERIOGRAPHY OF THE PROPER HEPATIC ARTERY 3. ADDITIONAL SELECTIVE ARTERIOGRAPHY OF THE RIGHT HEPATIC ARTERY 4. TRANSCATHETER EMBOLIZATION OF THE LIVER TO TREAT METASTATIC CARCINOMA WITH Y-90 RADIOEMBOLIZATION MEDICATIONS: 2 g IV Mefoxin, 4 mg IV Zofran, 8 mg IV Decadron and 40 mg IV Protonix ANESTHESIA/SEDATION: Moderate (conscious) sedation was employed during this procedure. A total of Versed 2.0 mg and Fentanyl 100 mcg was administered intravenously. Moderate Sedation Time: 46 minutes. The patient's level of consciousness and vital signs were monitored continuously by radiology nursing throughout the procedure under my direct supervision. CONTRAST:  50 mL Omnipaque 300 FLUOROSCOPY TIME:  Radiation Exposure Index (as provided by the fluoroscopic device): 56 mGy Kerma COMPLICATIONS: None immediate. PROCEDURE: Informed consent was obtained from the patient following explanation of the procedure, risks, benefits and alternatives. The patient understands, agrees and consents for the procedure. All questions were addressed. A time out was performed prior to the initiation of the procedure. Maximal barrier sterile technique utilized including caps, mask, sterile gowns, sterile gloves, large sterile drape, hand hygiene, and Betadine prep. Ultrasound was used to confirm patency of  the right common femoral artery. A permanent ultrasound image was saved and recorded. Ultrasound-guided access of the right common femoral artery was performed with a micropuncture set. A 5 French sheath was placed over a guidewire. A 5 French Cobra catheter was introduced and advanced through the abdominal aorta. This was used to selectively catheterize the celiac axis and further selectively catheterize the common hepatic artery. Selective arteriography was then performed through the 5 French catheter at the level of the proper hepatic artery just beyond the gastroduodenal artery. Repeat selective arteriography was necessary to assess arterial patency and perfusion to hepatic  metastatic lesions prior to radioembolization. A microcatheter was then advanced through the 5 French catheter to the level of the right hepatic artery. Selective arteriography was performed through the microcatheter and the catheter positioned at an appropriate level for radioembolization. Radioembolization was then performed through a dedicated infusion system with delivery Y-90 SIR Spheres. The radioactive microspheres were slowly delivered in diluted contrast material through the dedicated delivery system under fluoroscopic guidance. After complete delivery of the dose, the catheter and microcatheter were withdrawn from the body and discarded appropriately. Oblique arteriography was performed through the 5 French sheath. Arteriotomy closure was performed with the Angio-Seal device. FINDINGS: Proper hepatic and right hepatic arteriography demonstrates stable appearance of hepatic arterial supply since prior arteriography on 09/01/2022 and ascending branches of the right hepatic artery supplying two large areas of metastatic disease in the superior right lobe of the liver. The microcatheter was positioned just proximal to ascending branches of the right hepatic artery. A full dose of 26 millicuries of microspheres was able to be delivered  to the right lobe. IMPRESSION: Yttrium 90 radioembolization to the right lobe of the liver with Y-90 SIR Spheres. A dose of 26 millicuries of microspheres was delivered via the right hepatic artery. Electronically Signed   By: Irish Lack M.D.   On: 09/16/2022 12:47   IR US Guide Vasc Access Right  Result Date: 09/16/2022 INDICATION: Metastatic colorectal carcinoma to the liver. Presenting for Yttrium-90 radioembolization treatment to the right lobe of the liver. EXAM: 1. ULTRASOUND GUIDANCE FOR VASCULAR ACCESS OF THE RIGHT COMMON FEMORAL ARTERY 2. SELECTIVE ARTERIOGRAPHY OF THE PROPER HEPATIC ARTERY 3. ADDITIONAL SELECTIVE ARTERIOGRAPHY OF THE RIGHT HEPATIC ARTERY 4. TRANSCATHETER EMBOLIZATION OF THE LIVER TO TREAT METASTATIC CARCINOMA WITH Y-90 RADIOEMBOLIZATION MEDICATIONS: 2 g IV Mefoxin, 4 mg IV Zofran, 8 mg IV Decadron and 40 mg IV Protonix ANESTHESIA/SEDATION: Moderate (conscious) sedation was employed during this procedure. A total of Versed 2.0 mg and Fentanyl 100 mcg was administered intravenously. Moderate Sedation Time: 46 minutes. The patient's level of consciousness and vital signs were monitored continuously by radiology nursing throughout the procedure under my direct supervision. CONTRAST:  50 mL Omnipaque 300 FLUOROSCOPY TIME:  Radiation Exposure Index (as provided by the fluoroscopic device): 56 mGy Kerma COMPLICATIONS: None immediate. PROCEDURE: Informed consent was obtained from the patient following explanation of the procedure, risks, benefits and alternatives. The patient understands, agrees and consents for the procedure. All questions were addressed. A time out was performed prior to the initiation of the procedure. Maximal barrier sterile technique utilized including caps, mask, sterile gowns, sterile gloves, large sterile drape, hand hygiene, and Betadine prep. Ultrasound was used to confirm patency of the right common femoral artery. A permanent ultrasound image was saved and  recorded. Ultrasound-guided access of the right common femoral artery was performed with a micropuncture set. A 5 French sheath was placed over a guidewire. A 5 French Cobra catheter was introduced and advanced through the abdominal aorta. This was used to selectively catheterize the celiac axis and further selectively catheterize the common hepatic artery. Selective arteriography was then performed through the 5 French catheter at the level of the proper hepatic artery just beyond the gastroduodenal artery. Repeat selective arteriography was necessary to assess arterial patency and perfusion to hepatic metastatic lesions prior to radioembolization. A microcatheter was then advanced through the 5 French catheter to the level of the right hepatic artery. Selective arteriography was performed through the microcatheter and the catheter positioned at an appropriate level for radioembolization.  Radioembolization was then performed through a dedicated infusion system with delivery Y-90 SIR Spheres. The radioactive microspheres were slowly delivered in diluted contrast material through the dedicated delivery system under fluoroscopic guidance. After complete delivery of the dose, the catheter and microcatheter were withdrawn from the body and discarded appropriately. Oblique arteriography was performed through the 5 French sheath. Arteriotomy closure was performed with the Angio-Seal device. FINDINGS: Proper hepatic and right hepatic arteriography demonstrates stable appearance of hepatic arterial supply since prior arteriography on 09/01/2022 and ascending branches of the right hepatic artery supplying two large areas of metastatic disease in the superior right lobe of the liver. The microcatheter was positioned just proximal to ascending branches of the right hepatic artery. A full dose of 26 millicuries of microspheres was able to be delivered to the right lobe. IMPRESSION: Yttrium 90 radioembolization to the right lobe  of the liver with Y-90 SIR Spheres. A dose of 26 millicuries of microspheres was delivered via the right hepatic artery. Electronically Signed   By: Irish Lack M.D.   On: 09/16/2022 12:47   IR Angiogram Selective Each Additional Vessel  Result Date: 09/16/2022 INDICATION: Metastatic colorectal carcinoma to the liver. Presenting for Yttrium-90 radioembolization treatment to the right lobe of the liver. EXAM: 1. ULTRASOUND GUIDANCE FOR VASCULAR ACCESS OF THE RIGHT COMMON FEMORAL ARTERY 2. SELECTIVE ARTERIOGRAPHY OF THE PROPER HEPATIC ARTERY 3. ADDITIONAL SELECTIVE ARTERIOGRAPHY OF THE RIGHT HEPATIC ARTERY 4. TRANSCATHETER EMBOLIZATION OF THE LIVER TO TREAT METASTATIC CARCINOMA WITH Y-90 RADIOEMBOLIZATION MEDICATIONS: 2 g IV Mefoxin, 4 mg IV Zofran, 8 mg IV Decadron and 40 mg IV Protonix ANESTHESIA/SEDATION: Moderate (conscious) sedation was employed during this procedure. A total of Versed 2.0 mg and Fentanyl 100 mcg was administered intravenously. Moderate Sedation Time: 46 minutes. The patient's level of consciousness and vital signs were monitored continuously by radiology nursing throughout the procedure under my direct supervision. CONTRAST:  50 mL Omnipaque 300 FLUOROSCOPY TIME:  Radiation Exposure Index (as provided by the fluoroscopic device): 56 mGy Kerma COMPLICATIONS: None immediate. PROCEDURE: Informed consent was obtained from the patient following explanation of the procedure, risks, benefits and alternatives. The patient understands, agrees and consents for the procedure. All questions were addressed. A time out was performed prior to the initiation of the procedure. Maximal barrier sterile technique utilized including caps, mask, sterile gowns, sterile gloves, large sterile drape, hand hygiene, and Betadine prep. Ultrasound was used to confirm patency of the right common femoral artery. A permanent ultrasound image was saved and recorded. Ultrasound-guided access of the right common femoral  artery was performed with a micropuncture set. A 5 French sheath was placed over a guidewire. A 5 French Cobra catheter was introduced and advanced through the abdominal aorta. This was used to selectively catheterize the celiac axis and further selectively catheterize the common hepatic artery. Selective arteriography was then performed through the 5 French catheter at the level of the proper hepatic artery just beyond the gastroduodenal artery. Repeat selective arteriography was necessary to assess arterial patency and perfusion to hepatic metastatic lesions prior to radioembolization. A microcatheter was then advanced through the 5 French catheter to the level of the right hepatic artery. Selective arteriography was performed through the microcatheter and the catheter positioned at an appropriate level for radioembolization. Radioembolization was then performed through a dedicated infusion system with delivery Y-90 SIR Spheres. The radioactive microspheres were slowly delivered in diluted contrast material through the dedicated delivery system under fluoroscopic guidance. After complete delivery of the dose, the catheter  and microcatheter were withdrawn from the body and discarded appropriately. Oblique arteriography was performed through the 5 French sheath. Arteriotomy closure was performed with the Angio-Seal device. FINDINGS: Proper hepatic and right hepatic arteriography demonstrates stable appearance of hepatic arterial supply since prior arteriography on 09/01/2022 and ascending branches of the right hepatic artery supplying two large areas of metastatic disease in the superior right lobe of the liver. The microcatheter was positioned just proximal to ascending branches of the right hepatic artery. A full dose of 26 millicuries of microspheres was able to be delivered to the right lobe. IMPRESSION: Yttrium 90 radioembolization to the right lobe of the liver with Y-90 SIR Spheres. A dose of 26 millicuries  of microspheres was delivered via the right hepatic artery. Electronically Signed   By: Irish Lack M.D.   On: 09/16/2022 12:47   NM PRE Y90 LIVER SPECT LUNG SHUNT ASSESSMENT  Result Date: 09/02/2022 CLINICAL DATA:  Metastatic colorectal carcinoma with metastasis to the liver. Mass thing and lung shunt fraction calculation prior to planned Y-90 radioembolization treatment of the right hepatic lobe. EXAM: NUCLEAR MEDICINE LIVER SCAN TECHNIQUE: Abdominal images were obtained in multiple projections after intrahepatic arterial injection of radiopharmaceutical. SPECT imaging was performed. Lung shunt calculation was performed. RADIOPHARMACEUTICALS:  4.20millicurie MAA TECHNETIUM TO 42M ALBUMIN AGGREGATED COMPARISON:  CT chest, abdomen and pelvis 08/10/2022 FINDINGS: The injected microaggregated albumin localizes within the liver. No evidence of activity within the stomach, duodenum, or bowel. Calculated shunt fraction to the lungs equals 7.4%. IMPRESSION: 1. No significant extrahepatic radiotracer activity following intrahepatic arterial injection of MAA. 2. Lung shunt fraction equals 7.4% Electronically Signed   By: Signa Kell M.D.   On: 09/02/2022 15:39   IR Angiogram Visceral Selective  Result Date: 09/01/2022 INDICATION: Metastatic colorectal carcinoma to the liver with recurrent metastatic disease in the right lobe of the liver. The patient presents for mapping hepatic arteriography, possible embolization and MAA injection for lung shunt fraction calculation prior to planned Y-90 radioembolization treatment of the right lobe of the liver. EXAM: 1. ULTRASOUND GUIDANCE FOR VASCULAR ACCESS OF THE RIGHT COMMON FEMORAL ARTERY 2. SELECTIVE ARTERIOGRAPHY OF THE CELIAC AXIS 3. ADDITIONAL SELECTIVE ARTERIOGRAPHY OF THE PROPER HEPATIC ARTERY 4. ADDITIONAL SELECTIVE ARTERIOGRAPHY OF THE RIGHT HEPATIC ARTERY 5. TECHNETIUM 99-M MAA INJECTION IN THE RIGHT HEPATIC ARTERY MEDICATIONS: None ANESTHESIA/SEDATION:  Moderate (conscious) sedation was employed during this procedure. A total of Versed 0.5 mg and Fentanyl 25 mcg was administered intravenously by radiology nursing. Moderate Sedation Time: 53 minutes. The patient's level of consciousness and vital signs were monitored continuously by radiology nursing throughout the procedure under my direct supervision. CONTRAST:  40 mL Omnipaque 300 FLUOROSCOPY TIME:  Radiation Exposure Index (as provided by the fluoroscopic device): 114 mGy Kerma COMPLICATIONS: None immediate. PROCEDURE: Informed consent was obtained from the patient following explanation of the procedure, risks, benefits and alternatives. The patient understands, agrees and consents for the procedure. All questions were addressed. A time out was performed prior to the initiation of the procedure. Maximal barrier sterile technique utilized including caps, mask, sterile gowns, sterile gloves, large sterile drape, hand hygiene, and chlorhexidine prep. Local anesthesia was provided with 1% lidocaine. Ultrasound was used to confirm patency of the right common femoral artery. A permanent ultrasound image was saved and recorded. Access of the artery was performed with a micropuncture set. A 5 French sheath was placed over a guidewire and utilized for access during the procedure. A 5 French Cobra catheter was advanced into the  abdominal aorta. This was used to selectively catheterize the celiac axis. Selective arteriography was performed through the catheter. The Cobra catheter was then used to selectively catheterize the proper hepatic artery. Selective arteriography was performed. A microcatheter was advanced through the 5 French catheter into the right hepatic artery. Selective arteriography was performed through the microcatheter. A dose of technetium 99-M MAA was obtained from Nuclear Medicine and injected through the microcatheter into the right hepatic artery utilizing sterile technique. The microcatheter was  fully flushed with sterile saline and both microcatheter and outer diagnostic catheter removed. Oblique arteriography was performed at the femoral puncture site through the 5 French sheath. Arteriotomy closure was performed with the Angio-Seal device. FINDINGS: Celiac arteriography demonstrates traditional branching anatomy of the celiac axis with patent bifurcation into common hepatic and splenic arteries. The common hepatic artery demonstrates bifurcation into gastroduodenal and proper hepatic arteries. Proper hepatic arteriography demonstrates normal bifurcation into right and left hepatic arteries. Relative hypovascular defects with some peripheral enhancement noted in the superior aspect of the right lobe corresponding to known metastatic lesions. Supply appears to be entirely from right hepatic arterial branches with no significant supply identified from the left hepatic artery. Additional right hepatic arteriography demonstrates tumor supply ascending in the right lobe. There are some descending branches supplying the inferior aspect of the right lobe without clear tumor supply and probable descending cystic artery supplying the gallbladder. After MAA injection into the right hepatic artery, the patient was transported to nuclear medicine for additional imaging which will include quantitative lung shunt fraction calculation. Nuclear medicine imaging will be reported separately. IMPRESSION: Mapping hepatic arteriography performed prior to planned right lobe radioembolization treatment in 2 weeks. Supply to 2 large areas of metastatic disease in the right lobe of the liver visible by arteriography and appear to be supplied by right hepatic arterial branches. MAA injection was performed in the right hepatic artery for nuclear medicine imaging and lung shunt fraction calculation. Electronically Signed   By: Irish Lack M.D.   On: 09/01/2022 11:59   IR Angiogram Selective Each Additional Vessel  Result  Date: 09/01/2022 INDICATION: Metastatic colorectal carcinoma to the liver with recurrent metastatic disease in the right lobe of the liver. The patient presents for mapping hepatic arteriography, possible embolization and MAA injection for lung shunt fraction calculation prior to planned Y-90 radioembolization treatment of the right lobe of the liver. EXAM: 1. ULTRASOUND GUIDANCE FOR VASCULAR ACCESS OF THE RIGHT COMMON FEMORAL ARTERY 2. SELECTIVE ARTERIOGRAPHY OF THE CELIAC AXIS 3. ADDITIONAL SELECTIVE ARTERIOGRAPHY OF THE PROPER HEPATIC ARTERY 4. ADDITIONAL SELECTIVE ARTERIOGRAPHY OF THE RIGHT HEPATIC ARTERY 5. TECHNETIUM 99-M MAA INJECTION IN THE RIGHT HEPATIC ARTERY MEDICATIONS: None ANESTHESIA/SEDATION: Moderate (conscious) sedation was employed during this procedure. A total of Versed 0.5 mg and Fentanyl 25 mcg was administered intravenously by radiology nursing. Moderate Sedation Time: 53 minutes. The patient's level of consciousness and vital signs were monitored continuously by radiology nursing throughout the procedure under my direct supervision. CONTRAST:  40 mL Omnipaque 300 FLUOROSCOPY TIME:  Radiation Exposure Index (as provided by the fluoroscopic device): 114 mGy Kerma COMPLICATIONS: None immediate. PROCEDURE: Informed consent was obtained from the patient following explanation of the procedure, risks, benefits and alternatives. The patient understands, agrees and consents for the procedure. All questions were addressed. A time out was performed prior to the initiation of the procedure. Maximal barrier sterile technique utilized including caps, mask, sterile gowns, sterile gloves, large sterile drape, hand hygiene, and chlorhexidine prep. Local anesthesia was  provided with 1% lidocaine. Ultrasound was used to confirm patency of the right common femoral artery. A permanent ultrasound image was saved and recorded. Access of the artery was performed with a micropuncture set. A 5 French sheath was placed  over a guidewire and utilized for access during the procedure. A 5 French Cobra catheter was advanced into the abdominal aorta. This was used to selectively catheterize the celiac axis. Selective arteriography was performed through the catheter. The Cobra catheter was then used to selectively catheterize the proper hepatic artery. Selective arteriography was performed. A microcatheter was advanced through the 5 French catheter into the right hepatic artery. Selective arteriography was performed through the microcatheter. A dose of technetium 99-M MAA was obtained from [facility] and injected through the microcatheter into the right hepatic artery utilizing sterile technique. The microcatheter was fully flushed with sterile saline and both microcatheter and outer diagnostic catheter removed. Oblique arteriography was performed at the femoral puncture site through the 5 French sheath. Arteriotomy closure was performed with the Angio-Seal device. FINDINGS: Celiac arteriography demonstrates traditional branching anatomy of the celiac axis with patent bifurcation into common hepatic and splenic arteries. The common hepatic artery demonstrates bifurcation into gastroduodenal and proper hepatic arteries. Proper hepatic arteriography demonstrates normal bifurcation into right and left hepatic arteries. Relative hypovascular defects with some peripheral enhancement noted in the superior aspect of the right lobe corresponding to known metastatic lesions. Supply appears to be entirely from right hepatic arterial branches with no significant supply identified from the left hepatic artery. Additional right hepatic arteriography demonstrates tumor supply ascending in the right lobe. There are some descending branches supplying the inferior aspect of the right lobe without clear tumor supply and probable descending cystic artery supplying the gallbladder. After MAA injection into the right hepatic artery, the patient was  transported to nuclear medicine for additional imaging which will include quantitative lung shunt fraction calculation. Nuclear medicine imaging will be reported separately. IMPRESSION: Mapping hepatic arteriography performed prior to planned right lobe radioembolization treatment in 2 weeks. Supply to 2 large areas of metastatic disease in the right lobe of the liver visible by arteriography and appear to be supplied by right hepatic arterial branches. MAA injection was performed in the right hepatic artery for nuclear medicine imaging and lung shunt fraction calculation. Electronically Signed   By: Irish Lack M.D.   On: 09/01/2022 11:59   IR Angiogram Selective Each Additional Vessel  Result Date: 09/01/2022 INDICATION: Metastatic colorectal carcinoma to the liver with recurrent metastatic disease in the right lobe of the liver. The patient presents for mapping hepatic arteriography, possible embolization and MAA injection for lung shunt fraction calculation prior to planned Y-90 radioembolization treatment of the right lobe of the liver. EXAM: 1. ULTRASOUND GUIDANCE FOR VASCULAR ACCESS OF THE RIGHT COMMON FEMORAL ARTERY 2. SELECTIVE ARTERIOGRAPHY OF THE CELIAC AXIS 3. ADDITIONAL SELECTIVE ARTERIOGRAPHY OF THE PROPER HEPATIC ARTERY 4. ADDITIONAL SELECTIVE ARTERIOGRAPHY OF THE RIGHT HEPATIC ARTERY 5. TECHNETIUM 99-M MAA INJECTION IN THE RIGHT HEPATIC ARTERY MEDICATIONS: None ANESTHESIA/SEDATION: Moderate (conscious) sedation was employed during this procedure. A total of Versed 0.5 mg and Fentanyl 25 mcg was administered intravenously by radiology nursing. Moderate Sedation Time: 53 minutes. The patient's level of consciousness and vital signs were monitored continuously by radiology nursing throughout the procedure under my direct supervision. CONTRAST:  40 mL Omnipaque 300 FLUOROSCOPY TIME:  Radiation Exposure Index (as provided by the fluoroscopic device): 114 mGy Kerma COMPLICATIONS: None immediate.  PROCEDURE: Informed consent was obtained  from the patient following explanation of the procedure, risks, benefits and alternatives. The patient understands, agrees and consents for the procedure. All questions were addressed. A time out was performed prior to the initiation of the procedure. Maximal barrier sterile technique utilized including caps, mask, sterile gowns, sterile gloves, large sterile drape, hand hygiene, and chlorhexidine prep. Local anesthesia was provided with 1% lidocaine. Ultrasound was used to confirm patency of the right common femoral artery. A permanent ultrasound image was saved and recorded. Access of the artery was performed with a micropuncture set. A 5 French sheath was placed over a guidewire and utilized for access during the procedure. A 5 French Cobra catheter was advanced into the abdominal aorta. This was used to selectively catheterize the celiac axis. Selective arteriography was performed through the catheter. The Cobra catheter was then used to selectively catheterize the proper hepatic artery. Selective arteriography was performed. A microcatheter was advanced through the 5 French catheter into the right hepatic artery. Selective arteriography was performed through the microcatheter. A dose of technetium 99-M MAA was obtained from [facility] and injected through the microcatheter into the right hepatic artery utilizing sterile technique. The microcatheter was fully flushed with sterile saline and both microcatheter and outer diagnostic catheter removed. Oblique arteriography was performed at the femoral puncture site through the 5 French sheath. Arteriotomy closure was performed with the Angio-Seal device. FINDINGS: Celiac arteriography demonstrates traditional branching anatomy of the celiac axis with patent bifurcation into common hepatic and splenic arteries. The common hepatic artery demonstrates bifurcation into gastroduodenal and proper hepatic arteries. Proper  hepatic arteriography demonstrates normal bifurcation into right and left hepatic arteries. Relative hypovascular defects with some peripheral enhancement noted in the superior aspect of the right lobe corresponding to known metastatic lesions. Supply appears to be entirely from right hepatic arterial branches with no significant supply identified from the left hepatic artery. Additional right hepatic arteriography demonstrates tumor supply ascending in the right lobe. There are some descending branches supplying the inferior aspect of the right lobe without clear tumor supply and probable descending cystic artery supplying the gallbladder. After MAA injection into the right hepatic artery, the patient was transported to nuclear medicine for additional imaging which will include quantitative lung shunt fraction calculation. Nuclear medicine imaging will be reported separately. IMPRESSION: Mapping hepatic arteriography performed prior to planned right lobe radioembolization treatment in 2 weeks. Supply to 2 large areas of metastatic disease in the right lobe of the liver visible by arteriography and appear to be supplied by right hepatic arterial branches. MAA injection was performed in the right hepatic artery for nuclear medicine imaging and lung shunt fraction calculation. Electronically Signed   By: Irish Lack M.D.   On: 09/01/2022 11:59   IR US Guide Vasc Access Right  Result Date: 09/01/2022 INDICATION: Metastatic colorectal carcinoma to the liver with recurrent metastatic disease in the right lobe of the liver. The patient presents for mapping hepatic arteriography, possible embolization and MAA injection for lung shunt fraction calculation prior to planned Y-90 radioembolization treatment of the right lobe of the liver. EXAM: 1. ULTRASOUND GUIDANCE FOR VASCULAR ACCESS OF THE RIGHT COMMON FEMORAL ARTERY 2. SELECTIVE ARTERIOGRAPHY OF THE CELIAC AXIS 3. ADDITIONAL SELECTIVE ARTERIOGRAPHY OF THE PROPER  HEPATIC ARTERY 4. ADDITIONAL SELECTIVE ARTERIOGRAPHY OF THE RIGHT HEPATIC ARTERY 5. TECHNETIUM 99-M MAA INJECTION IN THE RIGHT HEPATIC ARTERY MEDICATIONS: None ANESTHESIA/SEDATION: Moderate (conscious) sedation was employed during this procedure. A total of Versed 0.5 mg and Fentanyl 25 mcg was administered  intravenously by radiology nursing. Moderate Sedation Time: 53 minutes. The patient's level of consciousness and vital signs were monitored continuously by radiology nursing throughout the procedure under my direct supervision. CONTRAST:  40 mL Omnipaque 300 FLUOROSCOPY TIME:  Radiation Exposure Index (as provided by the fluoroscopic device): 114 mGy Kerma COMPLICATIONS: None immediate. PROCEDURE: Informed consent was obtained from the patient following explanation of the procedure, risks, benefits and alternatives. The patient understands, agrees and consents for the procedure. All questions were addressed. A time out was performed prior to the initiation of the procedure. Maximal barrier sterile technique utilized including caps, mask, sterile gowns, sterile gloves, large sterile drape, hand hygiene, and chlorhexidine prep. Local anesthesia was provided with 1% lidocaine. Ultrasound was used to confirm patency of the right common femoral artery. A permanent ultrasound image was saved and recorded. Access of the artery was performed with a micropuncture set. A 5 French sheath was placed over a guidewire and utilized for access during the procedure. A 5 French Cobra catheter was advanced into the abdominal aorta. This was used to selectively catheterize the celiac axis. Selective arteriography was performed through the catheter. The Cobra catheter was then used to selectively catheterize the proper hepatic artery. Selective arteriography was performed. A microcatheter was advanced through the 5 French catheter into the right hepatic artery. Selective arteriography was performed through the microcatheter. A dose  of technetium 99-M MAA was obtained from Nuclear Medicine and injected through the microcatheter into the right hepatic artery utilizing sterile technique. The microcatheter was fully flushed with sterile saline and both microcatheter and outer diagnostic catheter removed. Oblique arteriography was performed at the femoral puncture site through the 5 French sheath. Arteriotomy closure was performed with the Angio-Seal device. FINDINGS: Celiac arteriography demonstrates traditional branching anatomy of the celiac axis with patent bifurcation into common hepatic and splenic arteries. The common hepatic artery demonstrates bifurcation into gastroduodenal and proper hepatic arteries. Proper hepatic arteriography demonstrates normal bifurcation into right and left hepatic arteries. Relative hypovascular defects with some peripheral enhancement noted in the superior aspect of the right lobe corresponding to known metastatic lesions. Supply appears to be entirely from right hepatic arterial branches with no significant supply identified from the left hepatic artery. Additional right hepatic arteriography demonstrates tumor supply ascending in the right lobe. There are some descending branches supplying the inferior aspect of the right lobe without clear tumor supply and probable descending cystic artery supplying the gallbladder. After MAA injection into the right hepatic artery, the patient was transported to nuclear medicine for additional imaging which will include quantitative lung shunt fraction calculation. Nuclear medicine imaging will be reported separately. IMPRESSION: Mapping hepatic arteriography performed prior to planned right lobe radioembolization treatment in 2 weeks. Supply to 2 large areas of metastatic disease in the right lobe of the liver visible by arteriography and appear to be supplied by right hepatic arterial branches. MAA injection was performed in the right hepatic artery for nuclear medicine  imaging and lung shunt fraction calculation. Electronically Signed   By: Irish Lack M.D.   On: 09/01/2022 11:59    ASSESSMENT & PLAN:   James Coffey is a 52 y.o. male with    1.  Oligometastatic rectal cancer with 2 visible liver metastases solitary lesion in the right upper lobe of the lung.-newly noted on CT chest abdomen pelvis. KRAS mutated MMR IHC normal   2.  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 Rectal biopsies obtained on 11/30/20 confirmed rectal adenocarcinoma. -Baseline CEA on 12/09/20 was elevated at 16.7. -He underwent diagnostic laparoscopy and open descending loop colostomy on 12/13/20 with Dr. Gerrit Friends. -Chest CT on 12/14/20 was negative for metastatic disease. -Repeat CEA on 01/06/21 showed further elevation to 52.55. -staging pelvis MRI on 01/12/21 showed: rectal adenocarcinoma T stage: At least T3c, possible involvement of prostatic apex; N stage: N2; long segment rectal tumor, involving anal sphincters. -s/p neoadjuvant FOLFOX chemotherapy S/p resection of liver met and thermal ablation of 2nd liver met Currently completing Chemo radiation of rectal mass S/pSBRT of isolated lung lesions x 2. - CT AP from 09/28/22 shows: 1. 5.3 cm x 3.5 cm x 6.9 cm complex abscess within the posterior aspect of the pelvis, as described above. A fistulous connection to the urinary bladder cannot be excluded. 2. Mild multifocal right middle lobe, left upper lobe and right lower lobe infiltrates. 3. 3.3 cm x 2.1 cm lingular soft tissue mass and adjacent 9 mm diameter lung nodule, consistent with metastatic disease. 4. Innumerable heterogeneous low-attenuation liver masses of various sizes, markedly increased in size and number when compared to the  prior study, consistent with worsening metastatic disease.   3.  Iron deficiency-currently anemia has resolved and ferritin is reasonable Lab Results  Component Value Date   IRON 69 07/25/2022   TIBC 258 07/25/2022   FERRITIN 66 06/02/2022   4 Insomnia -lorazepam prn   5. Recurrent perianal abscess       PLAN: -Discussed CT chest abdomen pelvis with contrast scan from yesterday, 09/28/22, with patient's daughter over the phone: 1. 5.3 cm x 3.5 cm x 6.9 cm complex abscess within the posterior aspect of the pelvis, as described above. A fistulous connection to the urinary bladder cannot be excluded. 2. Mild multifocal right middle lobe, left upper lobe and right lower lobe infiltrates. 3. 3.3 cm x 2.1 cm lingular soft tissue mass and adjacent 9 mm diameter lung nodule, consistent with metastatic disease. 4. Innumerable heterogeneous low-attenuation liver masses of various sizes, markedly increased in size and number when compared to the prior study, consistent with worsening metastatic disease. -discussed goals of care in details with the patients daughter/HCPOA. -patient with significant weight loss and worsening functional status. -abx and IR pelvic abscess drainage per hospitalist. -patient with limited continued treatment options.for metastatic rectal cancer and no oncologic treatment at this time. -patient would a reasonable candidate for best supportive cares through hospice but GIC discussion ongoing,  .The total time spent in the appointment was 35 minutes* .  All of the patient's questions were answered with apparent satisfaction. The patient knows to call the clinic with any problems, questions or concerns.   Wyvonnia Lora MD MS AAHIVMS [facility] Hematology/Oncology Physician [facility]  .*Total Encounter Time as defined by the Centers for Medicare and Medicaid Services includes, in addition to the face-to-face time of a patient visit (documented in the  note above) non-face-to-face time: obtaining and reviewing outside history, ordering and reviewing medications, tests or procedures, care coordination (communications with other health care professionals or caregivers) and documentation in the medical record.   09/29/2022 3:20 PM

## 2022-09-29 NOTE — Progress Notes (Signed)
Progress Note   Patient: James Coffey OZH:[id number] DOB: [birth year] DOA: 10/04/2022     1 DOS: the patient was seen and examined on 09/29/2022   Brief hospital course: 52 year old male, primarily [place] speaking.  He has a significant past medical history of rectal cancer with mets to the liver, lung, and brain.  Patient's sp colectomy, chemotherapy, craniotomy, Y90 radioembolization of liver mets, radiation of lung lesions.  Patient recently admitted 3/43/13 with E. coli bacteremia and E. coli emphysematous cystitis.  During that admission he had an episode of atrial fibrillation.  Patient w/ advanced metastatic rectal cancer with extremely poor prognosis.  Patient was discharged home with palliative care.  Patient wishes to be full code.   Patient sent to ER by family because today he was pulling on his Foley catheter.  He was unable to explain why he kept doing it.  When they finally got him to stop, he did not realize what he was doing.   In the ER patient's lactic acid 6.5, WBCs 23.4, T. bili 3.2, potassium 5.2, CO2 18.  CT chest with pneumonia and pelvic abscess.  Patient afebrile, hemodynamically stable.  With some confusion.  Hospitalization requested.    Assessment and Plan: Present on Admission:  Toxic metabolic encephalopathy  Sepsis due to pelvic abscesses/lactic acidosis present on admit, pneumonia ruled out -Urine cx thus far pos for ecoli. Pending sensitivities -Continue on rocephin -IV fluid hydration -Lactate remains elevated, however is improved. Suspect lactate may be elevated in the setting of known malignancy -CT abd reviewed. 5x7cm abscess in the posterior pelvis with concerns of fistulizing into the bladder noted -Appreciate input by General Surgery and Urology. Unfortunately, given pt's comorbidities and metastatic disease, pt's situation is noted to be dire. Consideration for IR drainage, however a drain would almost certainly stay in the rest of his life, per  Surgery. -Consideration for IR drain placement. Defer to Palliative Care discussion before deciding if pt wants to pursue or now -Palliative Care has been consulted. Chart reviewed and pt historically had previously declined discussing end of life issues. However, given the unfortunate circumstances and patient's steady decline, Palliative discussion is absolutely necessary at this time    Hyponatremia -cont IV fluid hydration.   -resolved -recheck bmet in AM    Hypokalemia -Corrected -Recheck bmet in AM    Hyperglycemia -Sliding scale insulin -Patient currently n.p.o.    Jaundice with elevated LFT's -Suspect related to worsening liver mets -ammonia levels unremarkable -recheck CMP in aM    Autonomic hypotension -Continue midodrine, and decadron    Recent left frontal craniotomy with tumor resection - 2/28 (Dr. Franky Macho) Stable.  Was recently given limited course of keppra, now off    Atrial fib -Not a candidate for anticoagulation currently given prior hx of severe thrombocytopenia, recent craniotomy with surgery -On 4/23, noted to have sustained run of narrow complex tachycardia -Now on scheduled metoprolol for rate control, see below    Foley in place -Foley replaced 4/21 -Urology following -continue rocepin per above    Metastatic rectal carcinoma  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  Rectal cancer metastasized to liver  Rectal cancer metastasized to lung -Have reached out to Dr. Candise Che who will see in consultation. -Overall poor prognosis  ST Depression MI with narrow complex tachycardia and wide complex tachycardia -On the morning of 4/23, pt was noted to have sustained run of HR in excess of 180-190's, narrow complex -Given dose of IV metoprolol and PO metoprolol. Rhythm later converted  to sustained wide complex tachycardia. Pt remained asymptomatic -Rhythm later converted back to sinus, rate controlled -EKG obtained and reviewed. New ST  depressions noted this visit. -Pt denies chest pains -Continued BID metoprolol and added aspirin. Given elevated LFT's, did not order statin -Discussed and reviewed EKG's with on-call Cardiologist. Given progressive and incurable disease with overall poor prognosis, Cardiology recommends no aggressive intervention at this time. Cardiology agrees with current asa and BB as already ordered and did not recommend anticoagulation -Have ordered a limited 2d echo to review   Subjective: Denies chest pain or sob. Feeling nauseated. See nursing documentation. Noted to have run of narrow complex tachycardia with ST changes on monitor  Physical Exam: Vitals:   09/29/22 1100 09/29/22 1158 09/29/22 1200 09/29/22 1300  BP: 102/83  96/78 (!) 116/91  Pulse: 88 85 86 85  Resp: 15   19  Temp:   98 F (36.7 C)   TempSrc:   Oral   SpO2: 94% 96% 94% 95%  Weight:      Height:       General exam: Conversant, in no acute distress Respiratory system: normal chest rise, clear, no audible wheezing Cardiovascular system: regular rhythm, s1-s2 Gastrointestinal system: Nondistended, nontender, pos BS Central nervous system: No seizures, no tremors Extremities: No cyanosis, no joint deformities Skin: No rashes, no pallor Psychiatry: Affect normal // no auditory hallucinations   Data Reviewed:  Labs reviewed: Na 137, K 4.7, Cr 0.66, WBC 17.0, Hgb 11.9  Family Communication: Pt in room, family at bedside  Disposition: Status is: Inpatient Remains inpatient appropriate because: Severity of illness  Planned Discharge Destination:  Unclear at this time    Author: Rickey Barbara, MD 09/29/2022 2:11 PM  For on call review [email or web address].

## 2022-09-29 NOTE — Progress Notes (Signed)
Palliative-   Adelisa called me back. She and her mother would like to meet tomorrow at 1pm. Plan to meet in patient's room.   Ocie Bob, AGNP-C Palliative Medicine  No charge

## 2022-09-29 NOTE — Progress Notes (Signed)
Palliative-   Chart reviewed and case discussed with Dr. Rhona Leavens. Patient with extensive cancer history, now with pelvic abscess fistulizing to bladder. No options for curative interventions. Patient at end of life.  He has been resistant to Palliative meeting in the past, but with further progression is aware that Palliative consult is necessary. Noted he has some encephalopathy.  I believe it will be best to involve his daughter and spouse who have previously been amenable to [facility] consult and discussion.  I spoke with patient's daughter [name]. She was expecting Palliative consult. She is going to to discuss with her Mom and call me back with time for meeting.  Thank you for this consult.   Ocie Bob, AGNP-C Palliative Medicine  No charge

## 2022-09-29 NOTE — Progress Notes (Addendum)
Progress note  Chief Complaint: Fatigue and altered mental status  History of Present Illness:  James Coffey is a 52y male presents to [facility] emergency department at the direction of family because he was pulling on his Foley catheter but unaware that he was doing it.  On arrival he was found to have lactic acidosis, WBCs to 23.4, elevated potassium and T. bili.  CT imaging revealed a pelvic abscess with a cutaneous perirectal, and possible bladder communication.  PMH is significant for rectal cancer metastatic to the liver, lung, and brain.  S/p colectomy, chemotherapy, craniotomy, radio embolization of liver metastasis and radiation of lung lesions.  He was seen by our service in June 2022 for Fournier's gangrene requiring multiple debridements. 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  Assessment was completed with the aid of a video interpreter.  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   pt denies   Cancer    rectal cancer with mets to liver   Tobacco abuse    Past Surgical History:  Procedure Laterality Date   APPLICATION OF CRANIAL NAVIGATION N/A 08/05/2022   Procedure: APPLICATION OF CRANIAL NAVIGATION;  Surgeon: Coletta Memos, MD;  Location: [facility];  Service: Neurosurgery;  Laterality: N/A;   CRANIOTOMY Left 08/05/2022   Procedure: Left frontal craniotomy for resection of tumor with Stealth;  Surgeon: Coletta Memos, MD;  Location: [facility];  Service: Neurosurgery;  Laterality: Left;  RM 19 to follow   HERNIA REPAIR     INCISION AND DRAINAGE ABSCESS N/A 12/01/2020   Procedure: INCISION AND DRAINAGE ABSCESS;  Surgeon: Karie Soda, MD;  Location: [facility];  Service: General;  Laterality: N/A;   INCISION AND DRAINAGE ABSCESS N/A 12/04/2020   Procedure: INCISION AND DRAINAGE FOURNIERS GANGRENE;  Surgeon: Luretha Murphy, MD;  Location: [facility];  Service: General;  Laterality: N/A;   INCISION AND DRAINAGE PERIRECTAL ABSCESS N/A  11/30/2020   Procedure: INCISION AND DEBRIDEMENT OF PERIRECTAL ABSCESS AND RECTAL WALL BIOPSIES;  Surgeon: Karie Soda, MD;  Location: [facility];  Service: General;  Laterality: N/A;   INGUINAL HERNIA REPAIR Right 2007   pt denies   IR ANGIOGRAM SELECTIVE EACH ADDITIONAL VESSEL  09/01/2022   IR ANGIOGRAM SELECTIVE EACH ADDITIONAL VESSEL  09/01/2022   IR ANGIOGRAM SELECTIVE EACH ADDITIONAL VESSEL  09/16/2022   IR ANGIOGRAM VISCERAL SELECTIVE  09/01/2022   IR ANGIOGRAM VISCERAL SELECTIVE  09/16/2022   IR EMBO TUMOR ORGAN ISCHEMIA INFARCT INC GUIDE ROADMAPPING  09/16/2022   IR IMAGING GUIDED PORT INSERTION  05/07/2021   IR RADIOLOGIST EVAL & MGMT  08/27/2021   IR RADIOLOGIST EVAL & MGMT  09/12/2021   IR RADIOLOGIST EVAL & MGMT  11/10/2021   IR RADIOLOGIST EVAL & MGMT  01/05/2022   IR SINUS/FIST TUBE CHK-NON GI  04/28/2022   IR US GUIDE VASC ACCESS RIGHT  09/01/2022   IR US GUIDE VASC ACCESS RIGHT  09/16/2022   LAPAROSCOPIC LOOP COLOSTOMY N/A 12/13/2020   Procedure: DIAGNOSTIC LAPAROSCOPY WITH  OPEN DESCENDING LOOP COLOSTOMY;  Surgeon: Darnell Level, MD;  [facility];  Service: General;  Laterality: N/A;   LAPAROSCOPY N/A 08/28/2021   Procedure: LAPAROSCOPY DIAGNOSTIC;  Surgeon: Fritzi Mandes, MD;  Location: [facility] OR;  Service: General;  Laterality: N/A;   OPEN PARTIAL HEPATECTOMY  Left 08/28/2021   Procedure: OPEN LEFT LATERAL HEPATECTOMY WITH INTRAOPERATIVE ULTRASOUND;  Surgeon: Fritzi Mandes, MD;  Location: [facility];  Service: General;  Laterality: Left;  RADIOLOGY WITH ANESTHESIA N/A 10/08/2021   Procedure: MICROWAVE ABLATION;  Surgeon: Irish Lack, MD;  Location: [facility];  Service: Radiology;  Laterality: N/A;   RECTAL SURGERY     XI ROBOTIC ASSISTED PARASTOMAL HERNIA REPAIR N/A 02/04/2022   Procedure: ROBOTIC ABDOMINAL PERINEAL RESECTION, PARASTOMAL HERNIA REPAIR, BILATERAL TAP BLOCK;  Surgeon: Romie Levee, MD;  [facility];  Service: General;  Laterality: N/A;     Allergies:   Allergies  Allergen Reactions   Hydrocodone Itching   Pork-Derived Products Other (See Comments)    Do not eat pork, Patient is ok with receiving heparin products    Family History  Problem Relation Age of Onset   CAD Neg Hx    Inflammatory bowel disease Neg Hx    Social History:  reports that he has been smoking cigarettes. He has been smoking an average of .25 packs per day. He has never used smokeless tobacco. He reports that he does not drink alcohol and does not use drugs.  ROS: UTA   Physical Exam:  Vital signs in last 24 hours: Temp:  [97.1 F (36.2 C)-98 F (36.7 C)] 98 F (36.7 C) (04/23 0800) Pulse Rate:  [78-106] 86 (04/23 1200) Resp:  [9-20] 15 (04/23 1100) BP: (96-147)/(67-111) 96/78 (04/23 1200) SpO2:  [86 %-100 %] 94 % (04/23 1200)  Cardiovascular: Skin warm; not flushed Respiratory: Breaths quiet; no shortness of breath Abdomen: Colostomy and multiple surgical scars. Neurological: Variable mentation. Musculoskeletal: Normal motor function arms and legs Genitourinary: Foley catheter in place.  Appears to have been poorly maintained.  Dark brown foul-smelling urine in bag..  Laboratory Data:  Results for orders placed or performed during the hospital encounter of 10-14-2022 (from the past 72 hour(s))  CBC with Differential     Status: Abnormal   Collection Time: 10-14-2022 10:20 PM  Result Value Ref Range   WBC 23.4 (H) 4.0 - 10.5 K/uL   RBC 4.14 (L) 4.22 - 5.81 MIL/uL   Hemoglobin 13.0 13.0 - 17.0 g/dL   HCT 16.1 09.6 - 04.5 %   MCV 101.2 (H) 80.0 - 100.0 fL   MCH 31.4 26.0 - 34.0 pg   MCHC 31.0 30.0 - 36.0 g/dL   RDW 40.9 (H) 81.1 - 91.4 %   Platelets 323 150 - 400 K/uL   nRBC 0.1 0.0 - 0.2 %   Neutrophils Relative % 91 %   Neutro Abs 21.4 (H) 1.7 - 7.7 K/uL   Lymphocytes Relative 1 %   Lymphs Abs 0.2 (L) 0.7 - 4.0 K/uL   Monocytes Relative 6 %   Monocytes Absolute 1.4 (H) 0.1 - 1.0 K/uL   Eosinophils Relative 0 %   Eosinophils Absolute 0.0 0.0 -  0.5 K/uL   Basophils Relative 0 %   Basophils Absolute 0.1 0.0 - 0.1 K/uL   Immature Granulocytes 2 %   Abs Immature Granulocytes 0.37 (H) 0.00 - 0.07 K/uL    Comment: Performed at [facility], [address] [place], [place] [address]  Comprehensive metabolic panel     Status: Abnormal   Collection Time: Oct 14, 2022 10:20 PM  Result Value Ref Range   Sodium 130 (L) 135 - 145 mmol/L   Potassium 5.2 (H) 3.5 - 5.1 mmol/L   Chloride 95 (L) 98 - 111 mmol/L   CO2 18 (L) 22 - 32 mmol/L   Glucose, Bld 200 (H) 70 - 99 mg/dL    Comment: Glucose reference range applies only to samples taken after fasting for  at least 8 hours.   BUN 32 (H) 6 - 20 mg/dL   Creatinine, Ser 1.61 0.61 - 1.24 mg/dL   Calcium 9.0 8.9 - 09.6 mg/dL   Total Protein 6.7 6.5 - 8.1 g/dL   Albumin 2.8 (L) 3.5 - 5.0 g/dL   AST 51 (H) 15 - 41 U/L   ALT 40 0 - 44 U/L   Alkaline Phosphatase 331 (H) 38 - 126 U/L   Total Bilirubin 3.2 (H) 0.3 - 1.2 mg/dL   GFR, Estimated >04 >54 mL/min    Comment: (NOTE) Calculated using the CKD-EPI Creatinine Equation (2021)    Anion gap 17 (H) 5 - 15    Comment: Performed at [facility], [address] [place], [place] [address]  Urinalysis, Routine w reflex microscopic -Urine, Catheterized; Indwelling urinary catheter     Status: Abnormal   Collection Time: 24-Oct-2022 10:20 PM  Result Value Ref Range   Color, Urine YELLOW YELLOW   APPearance TURBID (A) CLEAR   Specific Gravity, Urine 1.017 1.005 - 1.030   pH 7.0 5.0 - 8.0   Glucose, UA NEGATIVE NEGATIVE mg/dL   Hgb urine dipstick MODERATE (A) NEGATIVE   Bilirubin Urine NEGATIVE NEGATIVE   Ketones, ur NEGATIVE NEGATIVE mg/dL   Protein, ur >=914 (A) NEGATIVE mg/dL   Nitrite NEGATIVE NEGATIVE   Leukocytes,Ua MODERATE (A) NEGATIVE   RBC / HPF >50 0 - 5 RBC/hpf   WBC, UA >50 0 - 5 WBC/hpf   Bacteria, UA NONE SEEN NONE SEEN   Squamous Epithelial / HPF 0-5 0 - 5 /HPF   WBC Clumps PRESENT    Mucus  PRESENT     Comment: Performed at [facility], [address], [place], [place] [address]  Urine Culture (for pregnant, neutropenic or urologic patients or patients with an indwelling urinary catheter)     Status: Abnormal (Preliminary result)   Collection Time: Oct 24, 2022 10:20 PM   Specimen: Urine, Clean Catch  Result Value Ref Range   Specimen Description      URINE, CLEAN CATCH Performed at [facility], [address], [place], [place] [address]    Special Requests      NONE Performed at [facility], [address] [place], [place] [address]    Culture (A)     >=100,000 COLONIES/mL GRAM NEGATIVE RODS CULTURE REINCUBATED FOR BETTER GROWTH SUSCEPTIBILITIES TO FOLLOW Performed at [facility], [address] [place], [place] [address]    Report Status PENDING   Troponin I (High Sensitivity)     Status: None   Collection Time: Oct 24, 2022 11:02 PM  Result Value Ref Range   Troponin I (High Sensitivity) 15 <18 ng/L    Comment: (NOTE) Elevated high sensitivity troponin I (hsTnI) values and significant  changes across serial measurements may suggest ACS but many other  chronic and acute conditions are known to elevate hsTnI results.  Refer to the "Links" section for chest pain algorithms and additional  guidance. Performed at [facility], [address] [place], [place] [address]   Blood Culture (routine x 2)     Status: None (Preliminary result)   Collection Time: 10/24/22 11:47 PM   Specimen: Porta Cath; Blood  Result Value Ref Range   Specimen Description PORTA CATH    Special Requests      BOTTLES DRAWN AEROBIC AND ANAEROBIC Blood Culture results may not be optimal due to an inadequate volume of blood received in  culture bottles   Culture      NO GROWTH 1 DAY Performed at [facility], [address] [place], [place] [address]    Report Status PENDING   Lactic acid, plasma      Status: Abnormal   Collection Time: 09/28/22 12:11 AM  Result Value Ref Range   Lactic Acid, Venous 6.5 (HH) 0.5 - 1.9 mmol/L    Comment: CRITICAL RESULT CALLED TO, READ BACK BY AND VERIFIED WITH CARTER,S AT 0118 ON 09/28/22 BY LUZOLOP Performed at [facility], [address], [place], [place] [address]   Lactic acid, plasma     Status: Abnormal   Collection Time: 09/28/22  2:10 AM  Result Value Ref Range   Lactic Acid, Venous 6.0 (HH) 0.5 - 1.9 mmol/L    Comment: CRITICAL VALUE NOTED. VALUE IS CONSISTENT WITH PREVIOUSLY REPORTED/CALLED VALUE Performed at [facility], [address], [place], [place] [address]   Troponin I (High Sensitivity)     Status: None   Collection Time: 09/28/22  2:10 AM  Result Value Ref Range   Troponin I (High Sensitivity) 17 <18 ng/L    Comment: (NOTE) Elevated high sensitivity troponin I (hsTnI) values and significant  changes across serial measurements may suggest ACS but many other  chronic and acute conditions are known to elevate hsTnI results.  Refer to the "Links" section for chest pain algorithms and additional  guidance. Performed at [facility], [address], [place], [place] [address]   Comprehensive metabolic panel     Status: Abnormal   Collection Time: 09/28/22  5:30 AM  Result Value Ref Range   Sodium 133 (L) 135 - 145 mmol/L   Potassium 4.2 3.5 - 5.1 mmol/L   Chloride 93 (L) 98 - 111 mmol/L   CO2 25 22 - 32 mmol/L   Glucose, Bld 122 (H) 70 - 99 mg/dL    Comment: Glucose reference range applies only to samples taken after fasting for at least 8 hours.   BUN 29 (H) 6 - 20 mg/dL   Creatinine, Ser 2.95 0.61 - 1.24 mg/dL   Calcium 8.5 (L) 8.9 - 10.3 mg/dL   Total Protein 5.7 (L) 6.5 - 8.1 g/dL   Albumin 2.4 (L) 3.5 - 5.0 g/dL   AST 64 (H) 15 - 41 U/L   ALT 44 0 - 44 U/L   Alkaline Phosphatase 248 (H) 38 - 126 U/L   Total Bilirubin 3.0 (H) 0.3 - 1.2 mg/dL   GFR, Estimated  >62 >13 mL/min    Comment: (NOTE) Calculated using the CKD-EPI Creatinine Equation (2021)    Anion gap 15 5 - 15    Comment: Performed at [facility], [address] [place], [place] [address]  Protime-INR     Status: Abnormal   Collection Time: 09/28/22  5:30 AM  Result Value Ref Range   Prothrombin Time 16.3 (H) 11.4 - 15.2 seconds   INR 1.3 (H) 0.8 - 1.2    Comment: (NOTE) INR goal varies based on device and disease states. Performed at [facility], [address] [place], [place] [address]   Ammonia     Status: None   Collection Time: 09/28/22  5:30 AM  Result Value Ref Range   Ammonia 34 9 - 35 umol/L    Comment: HEMOLYSIS AT THIS LEVEL MAY AFFECT RESULT Performed at [facility], [address], [place], [place] [address]   Lactic  acid, plasma     Status: Abnormal   Collection Time: 09/28/22  5:30 AM  Result Value Ref Range   Lactic Acid, Venous 6.0 (HH) 0.5 - 1.9 mmol/L    Comment: CRITICAL VALUE NOTED. VALUE IS CONSISTENT WITH PREVIOUSLY REPORTED/CALLED VALUE Performed at [facility], [address], [place], [place] [address]   MRSA Next Gen by PCR, Nasal     Status: None   Collection Time: 09/28/22  6:09 AM   Specimen: Nasal Mucosa; Nasal Swab  Result Value Ref Range   MRSA by PCR Next Gen NOT DETECTED NOT DETECTED    Comment: (NOTE) The GeneXpert MRSA Assay (FDA approved for NASAL specimens only), is one component of a comprehensive MRSA colonization surveillance program. It is not intended to diagnose MRSA infection nor to guide or monitor treatment for MRSA infections. Test performance is not FDA approved in patients less than 50 years old. Performed at [facility], [address] [place], [place] [address]   Glucose, capillary     Status: Abnormal   Collection Time: 09/28/22  6:13 AM  Result Value Ref Range   Glucose-Capillary 124 (H) 70 - 99 mg/dL     Comment: Glucose reference range applies only to samples taken after fasting for at least 8 hours.  Blood Culture (routine x 2)     Status: None (Preliminary result)   Collection Time: 09/28/22  7:05 AM   Specimen: BLOOD  Result Value Ref Range   Specimen Description      BLOOD BLOOD LEFT HAND Performed at [facility], [address], [place], [place] [address]    Special Requests      BOTTLES DRAWN AEROBIC ONLY Blood Culture adequate volume Performed at [facility], [address], [place], [place] [address]    Culture      NO GROWTH < 24 HOURS Performed at [facility], [address], [place], [place] [address]    Report Status PENDING   CBC with Differential/Platelet     Status: Abnormal   Collection Time: 09/28/22  8:06 AM  Result Value Ref Range   WBC 18.7 (H) 4.0 - 10.5 K/uL   RBC 3.29 (L) 4.22 - 5.81 MIL/uL   Hemoglobin 10.3 (L) 13.0 - 17.0 g/dL   HCT 47.4 (L) 25.9 - 56.3 %   MCV 100.6 (H) 80.0 - 100.0 fL   MCH 31.3 26.0 - 34.0 pg   MCHC 31.1 30.0 - 36.0 g/dL   RDW 87.5 (H) 64.3 - 32.9 %   Platelets 224 150 - 400 K/uL   nRBC 0.0 0.0 - 0.2 %   Neutrophils Relative % 93 %   Neutro Abs 17.4 (H) 1.7 - 7.7 K/uL   Lymphocytes Relative 2 %   Lymphs Abs 0.3 (L) 0.7 - 4.0 K/uL   Monocytes Relative 4 %   Monocytes Absolute 0.8 0.1 - 1.0 K/uL   Eosinophils Relative 0 %   Eosinophils Absolute 0.0 0.0 - 0.5 K/uL   Basophils Relative 0 %   Basophils Absolute 0.0 0.0 - 0.1 K/uL   Immature Granulocytes 1 %   Abs Immature Granulocytes 0.23 (H) 0.00 - 0.07 K/uL    Comment: Performed at [facility], [address] [place], [place] [address]  Hemoglobin A1c     Status: None   Collection Time: 09/28/22  8:06 AM  Result Value Ref Range   Hgb A1c MFr Bld 5.0 4.8 - 5.6 %  Comment: (NOTE) Pre diabetes:          5.7%-6.4%  Diabetes:              >6.4%  Glycemic control for   <7.0% adults with diabetes    Mean  Plasma Glucose 96.8 mg/dL    Comment: Performed at [facility], [address], [place], [place] [address]  Glucose, capillary     Status: Abnormal   Collection Time: 09/28/22  8:28 AM  Result Value Ref Range   Glucose-Capillary 126 (H) 70 - 99 mg/dL    Comment: Glucose reference range applies only to samples taken after fasting for at least 8 hours.  Osmolality     Status: None   Collection Time: 09/28/22  9:59 AM  Result Value Ref Range   Osmolality 294 275 - 295 mOsm/kg    Comment: Performed at [facility], [address], [place], [place] [address]  Protime-INR     Status: None   Collection Time: 09/28/22 10:58 AM  Result Value Ref Range   Prothrombin Time 15.0 11.4 - 15.2 seconds   INR 1.2 0.8 - 1.2    Comment: (NOTE) INR goal varies based on device and disease states. Performed at [facility], [address], [place], [place] [address]   APTT     Status: Abnormal   Collection Time: 09/28/22 10:58 AM  Result Value Ref Range   aPTT 23 (L) 24 - 36 seconds    Comment: Performed at [facility], [address], [place], [place] [address]  Lactic acid, plasma     Status: Abnormal   Collection Time: 09/28/22 10:58 AM  Result Value Ref Range   Lactic Acid, Venous 5.3 (HH) 0.5 - 1.9 mmol/L    Comment: CRITICAL VALUE NOTED. VALUE IS CONSISTENT WITH PREVIOUSLY REPORTED/CALLED VALUE Performed at [facility], [address] [place], [place] [address]   Glucose, capillary     Status: Abnormal   Collection Time: 09/28/22 12:04 PM  Result Value Ref Range   Glucose-Capillary 103 (H) 70 - 99 mg/dL    Comment: Glucose reference range applies only to samples taken after fasting for at least 8 hours.  Glucose, capillary     Status: None   Collection Time: 09/28/22  5:02 PM  Result Value Ref Range   Glucose-Capillary 91 70 - 99 mg/dL    Comment: Glucose reference range applies only to samples taken after fasting for  at least 8 hours.   Comment 1 Notify RN   Glucose, capillary     Status: Abnormal   Collection Time: 09/28/22  8:11 PM  Result Value Ref Range   Glucose-Capillary 100 (H) 70 - 99 mg/dL    Comment: Glucose reference range applies only to samples taken after fasting for at least 8 hours.   Comment 1 Notify RN    Comment 2 Document in Chart   Glucose, capillary     Status: Abnormal   Collection Time: 09/29/22 12:00 AM  Result Value Ref Range   Glucose-Capillary 108 (H) 70 - 99 mg/dL    Comment: Glucose reference range applies only to samples taken after fasting for at least 8 hours.  Glucose, capillary     Status: Abnormal   Collection Time: 09/29/22  4:35 AM  Result Value Ref Range   Glucose-Capillary 119 (H) 70 - 99 mg/dL    Comment: Glucose reference range applies only to samples taken after fasting for at  least 8 hours.  Magnesium     Status: None   Collection Time: 09/29/22  5:33 AM  Result Value Ref Range   Magnesium 2.3 1.7 - 2.4 mg/dL    Comment: Performed at [facility], [address] [place], [place] [address]  Comprehensive metabolic panel     Status: Abnormal   Collection Time: 09/29/22  5:46 AM  Result Value Ref Range   Sodium 137 135 - 145 mmol/L   Potassium 4.7 3.5 - 5.1 mmol/L   Chloride 100 98 - 111 mmol/L   CO2 20 (L) 22 - 32 mmol/L   Glucose, Bld 110 (H) 70 - 99 mg/dL    Comment: Glucose reference range applies only to samples taken after fasting for at least 8 hours.   BUN 31 (H) 6 - 20 mg/dL   Creatinine, Ser 6.04 0.61 - 1.24 mg/dL   Calcium 8.4 (L) 8.9 - 10.3 mg/dL   Total Protein 5.6 (L) 6.5 - 8.1 g/dL   Albumin 2.4 (L) 3.5 - 5.0 g/dL   AST 540 (H) 15 - 41 U/L   ALT 321 (H) 0 - 44 U/L   Alkaline Phosphatase 252 (H) 38 - 126 U/L   Total Bilirubin 3.2 (H) 0.3 - 1.2 mg/dL   GFR, Estimated >98 >11 mL/min    Comment: (NOTE) Calculated using the CKD-EPI Creatinine Equation (2021)    Anion gap 17 (H) 5 - 15    Comment: Performed at  [facility], [address], [place], [place] [address]  CBC     Status: Abnormal   Collection Time: 09/29/22  5:46 AM  Result Value Ref Range   WBC 17.0 (H) 4.0 - 10.5 K/uL   RBC 3.73 (L) 4.22 - 5.81 MIL/uL   Hemoglobin 11.9 (L) 13.0 - 17.0 g/dL   HCT 29.5 62.1 - 30.8 %   MCV 106.7 (H) 80.0 - 100.0 fL   MCH 31.9 26.0 - 34.0 pg   MCHC 29.9 (L) 30.0 - 36.0 g/dL   RDW 65.7 (H) 84.6 - 96.2 %   Platelets 179 150 - 400 K/uL   nRBC 0.2 0.0 - 0.2 %    Comment: Performed at [facility], [address] [place], [place] [address]  Glucose, capillary     Status: Abnormal   Collection Time: 09/29/22  8:00 AM  Result Value Ref Range   Glucose-Capillary 125 (H) 70 - 99 mg/dL    Comment: Glucose reference range applies only to samples taken after fasting for at least 8 hours.   Comment 1 Notify RN    Comment 2 Document in Chart   Troponin I (High Sensitivity)     Status: Abnormal   Collection Time: 09/29/22 11:07 AM  Result Value Ref Range   Troponin I (High Sensitivity) 32 (H) <18 ng/L    Comment: (NOTE) Elevated high sensitivity troponin I (hsTnI) values and significant  changes across serial measurements may suggest ACS but many other  chronic and acute conditions are known to elevate hsTnI results.  Refer to the "Links" section for chest pain algorithms and additional  guidance. Performed at [facility], [address] [place], [place] [address]   Glucose, capillary     Status: Abnormal   Collection Time: 09/29/22 11:23 AM  Result Value Ref Range   Glucose-Capillary 103 (H) 70 - 99 mg/dL    Comment: Glucose reference range applies only to samples taken after fasting for at least 8 hours.  Comment 1 Notify RN    Comment 2 Document in Chart    Recent Results (from the past 240 hour(s))  Urine Culture (for pregnant, neutropenic or urologic patients or patients with an indwelling urinary catheter)     Status: Abnormal  (Preliminary result)   Collection Time: 09/26/2022 10:20 PM   Specimen: Urine, Clean Catch  Result Value Ref Range Status   Specimen Description   Final    URINE, CLEAN CATCH Performed at [facility], [address], [place], [place] [address]    Special Requests   Final    NONE Performed at [facility], [address] [place], [place] [address]    Culture (A)  Final    >=100,000 COLONIES/mL GRAM NEGATIVE RODS CULTURE REINCUBATED FOR BETTER GROWTH SUSCEPTIBILITIES TO FOLLOW Performed at [facility], [address], [place], [place] [address]    Report Status PENDING  Incomplete  Blood Culture (routine x 2)     Status: None (Preliminary result)   Collection Time: 09/20/2022 11:47 PM   Specimen: Porta Cath; Blood  Result Value Ref Range Status   Specimen Description PORTA CATH  Final   Special Requests   Final    BOTTLES DRAWN AEROBIC AND ANAEROBIC Blood Culture results may not be optimal due to an inadequate volume of blood received in culture bottles   Culture   Final    NO GROWTH 1 DAY Performed at [facility], [address] [place], [place] [address]    Report Status PENDING  Incomplete  MRSA Next Gen by PCR, Nasal     Status: None   Collection Time: 09/28/22  6:09 AM   Specimen: Nasal Mucosa; Nasal Swab  Result Value Ref Range Status   MRSA by PCR Next Gen NOT DETECTED NOT DETECTED Final    Comment: (NOTE) The GeneXpert MRSA Assay (FDA approved for NASAL specimens only), is one component of a comprehensive MRSA colonization surveillance program. It is not intended to diagnose MRSA infection nor to guide or monitor treatment for MRSA infections. Test performance is not FDA approved in patients less than 32 years old. Performed at [facility], [address] [place], [place] [address]   Blood Culture (routine x 2)     Status: None (Preliminary result)   Collection Time: 09/28/22  7:05 AM    Specimen: BLOOD  Result Value Ref Range Status   Specimen Description   Final    BLOOD BLOOD LEFT HAND Performed at [facility], [address] [place], [place] [address]    Special Requests   Final    BOTTLES DRAWN AEROBIC ONLY Blood Culture adequate volume Performed at [facility], [address], [place], [place] [address]    Culture   Final    NO GROWTH < 24 HOURS Performed at [facility], [address], [place], [place] [address]    Report Status PENDING  Incomplete   Creatinine: Recent Labs    10/06/2022 2220 09/28/22 0530 09/29/22 0546  CREATININE 0.77 0.70 0.66    Impression/Assessment:  CT cystogram shows communication with perirectal abscess with cutaneous drainage  Plan:  -Had a long conversation with the patient and his daughters this morning.  The cystogram did show communication with his perirectal abscess.  Considering his level of decompensation and advancement of his cancer I suggested beginning with IR drainage and at most bilateral diversion with percutaneous nephrostomy tubes in the future.  It is likely that he has very little time left and I am of the opinion that future efforts should be made in the interest of palliation.  Just after leaving the patient's room it appears that he probably had a STEMI and palliative care has been consulted.  Reviewed case with Dr. Laverle Patter and general surgery team.  GS has already been in discussion with interventional radiology and will order the drain once he is stable.   Please call with questions or concerns.  Urology to follow along peripherally.  Scherrie Bateman Remon Quinto 09/29/2022, 1:08 PM   Attestation:   I have independently seen the patient and reviewed the pertinent imaging and labs and concur with the recommendation.  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  If clinically indicated, would consider percutaneous drainage of pelvic  fluid collection and continue Foley catheter drainage.  If he still does not clinically improve, could consider bilateral percutaneous nephrostomy tube placement for proximal urinary diversion.  However, in what appears to be a palliative situation with probably locally invasive malignancy as cause for new fistulous communication compared to last month, would simply begin with percutaneous drainage of fluid collection at this time.

## 2022-09-30 ENCOUNTER — Inpatient Hospital Stay (HOSPITAL_COMMUNITY): Payer: Medicaid Other

## 2022-09-30 DIAGNOSIS — J9601 Acute respiratory failure with hypoxia: Secondary | ICD-10-CM

## 2022-09-30 DIAGNOSIS — G9341 Metabolic encephalopathy: Secondary | ICD-10-CM | POA: Insufficient documentation

## 2022-09-30 DIAGNOSIS — A419 Sepsis, unspecified organism: Secondary | ICD-10-CM | POA: Diagnosis not present

## 2022-09-30 DIAGNOSIS — C7931 Secondary malignant neoplasm of brain: Secondary | ICD-10-CM

## 2022-09-30 DIAGNOSIS — Z515 Encounter for palliative care: Secondary | ICD-10-CM

## 2022-09-30 DIAGNOSIS — J181 Lobar pneumonia, unspecified organism: Secondary | ICD-10-CM

## 2022-09-30 DIAGNOSIS — R627 Adult failure to thrive: Secondary | ICD-10-CM | POA: Diagnosis not present

## 2022-09-30 DIAGNOSIS — I214 Non-ST elevation (NSTEMI) myocardial infarction: Secondary | ICD-10-CM

## 2022-09-30 DIAGNOSIS — I2489 Other forms of acute ischemic heart disease: Secondary | ICD-10-CM

## 2022-09-30 DIAGNOSIS — J189 Pneumonia, unspecified organism: Secondary | ICD-10-CM | POA: Diagnosis not present

## 2022-09-30 DIAGNOSIS — I213 ST elevation (STEMI) myocardial infarction of unspecified site: Secondary | ICD-10-CM | POA: Diagnosis not present

## 2022-09-30 DIAGNOSIS — R6520 Severe sepsis without septic shock: Secondary | ICD-10-CM

## 2022-09-30 LAB — CULTURE, BLOOD (ROUTINE X 2): Culture: NO GROWTH

## 2022-09-30 LAB — CBC
HCT: 38 % — ABNORMAL LOW (ref 39.0–52.0)
Hemoglobin: 11.3 g/dL — ABNORMAL LOW (ref 13.0–17.0)
MCH: 31.7 pg (ref 26.0–34.0)
MCHC: 29.7 g/dL — ABNORMAL LOW (ref 30.0–36.0)
MCV: 106.7 fL — ABNORMAL HIGH (ref 80.0–100.0)
Platelets: 112 10*3/uL — ABNORMAL LOW (ref 150–400)
RBC: 3.56 MIL/uL — ABNORMAL LOW (ref 4.22–5.81)
RDW: 20.2 % — ABNORMAL HIGH (ref 11.5–15.5)
WBC: 14.5 10*3/uL — ABNORMAL HIGH (ref 4.0–10.5)
nRBC: 0.2 % (ref 0.0–0.2)

## 2022-09-30 LAB — COMPREHENSIVE METABOLIC PANEL
ALT: 544 U/L — ABNORMAL HIGH (ref 0–44)
AST: 772 U/L — ABNORMAL HIGH (ref 15–41)
Albumin: 2.2 g/dL — ABNORMAL LOW (ref 3.5–5.0)
Alkaline Phosphatase: 241 U/L — ABNORMAL HIGH (ref 38–126)
Anion gap: 12 (ref 5–15)
BUN: 29 mg/dL — ABNORMAL HIGH (ref 6–20)
CO2: 18 mmol/L — ABNORMAL LOW (ref 22–32)
Calcium: 8.4 mg/dL — ABNORMAL LOW (ref 8.9–10.3)
Chloride: 108 mmol/L (ref 98–111)
Creatinine, Ser: 0.48 mg/dL — ABNORMAL LOW (ref 0.61–1.24)
GFR, Estimated: >60 mL/min (ref >60)
Glucose, Bld: 101 mg/dL — ABNORMAL HIGH (ref 70–99)
Potassium: 4.5 mmol/L (ref 3.5–5.1)
Sodium: 138 mmol/L (ref 135–145)
Total Bilirubin: 3.6 mg/dL — ABNORMAL HIGH (ref 0.3–1.2)
Total Protein: 5.3 g/dL — ABNORMAL LOW (ref 6.5–8.1)

## 2022-09-30 LAB — EXPECTORATED SPUTUM ASSESSMENT W GRAM STAIN, RFLX TO RESP C

## 2022-09-30 LAB — URINE CULTURE: Culture: >=100000 — AB

## 2022-09-30 LAB — GLUCOSE, CAPILLARY
Glucose-Capillary: 84 mg/dL (ref 70–99)
Glucose-Capillary: 85 mg/dL (ref 70–99)
Glucose-Capillary: 113 mg/dL — ABNORMAL HIGH (ref 70–99)

## 2022-09-30 LAB — ECHOCARDIOGRAM LIMITED
Calc EF: 41.1 %
Height: 69 in
S' Lateral: 3.7 cm
Single Plane A2C EF: 47.7 %
Single Plane A4C EF: 32.7 %
Weight: 1361.56 oz

## 2022-09-30 LAB — STREP PNEUMONIAE URINARY ANTIGEN: Strep Pneumo Urinary Antigen: NEGATIVE

## 2022-09-30 LAB — CULTURE, RESPIRATORY W GRAM STAIN

## 2022-09-30 MED ORDER — GLYCOPYRROLATE 1 MG PO TABS: 1.0000 mg | ORAL_TABLET | ORAL | Status: DC | PRN

## 2022-09-30 MED ORDER — LORAZEPAM 1 MG PO TABS: 1.0000 mg | ORAL_TABLET | ORAL | Status: DC | PRN

## 2022-09-30 MED ORDER — GLYCOPYRROLATE 0.2 MG/ML IJ SOLN
0.2000 mg | INTRAMUSCULAR | Status: DC | PRN
  Administered 2022-10-01: 0.2 mg via INTRAVENOUS
  Filled 2022-09-30: qty 1

## 2022-09-30 MED ORDER — HALOPERIDOL 0.5 MG PO TABS: 0.5000 mg | ORAL_TABLET | ORAL | Status: DC | PRN

## 2022-09-30 MED ORDER — GLYCOPYRROLATE 0.2 MG/ML IJ SOLN: 0.2000 mg | INTRAMUSCULAR | Status: DC | PRN

## 2022-09-30 MED ORDER — HYDROMORPHONE HCL 1 MG/ML IJ SOLN
1.0000 mg | INTRAMUSCULAR | Status: DC | PRN
  Administered 2022-10-01: 1 mg via INTRAVENOUS
  Filled 2022-09-30: qty 1

## 2022-09-30 MED ORDER — LORAZEPAM 2 MG/ML PO CONC: 1.0000 mg | ORAL | Status: DC | PRN

## 2022-09-30 MED ORDER — ACETAMINOPHEN 325 MG PO TABS: 650.0000 mg | ORAL_TABLET | Freq: Four times a day (QID) | ORAL | Status: DC | PRN

## 2022-09-30 MED ORDER — HYDROMORPHONE HCL 1 MG/ML IJ SOLN
1.0000 mg | Freq: Four times a day (QID) | INTRAMUSCULAR | Status: DC
  Administered 2022-09-30 – 2022-10-01 (×4): 1 mg via INTRAVENOUS
  Filled 2022-09-30 (×4): qty 1

## 2022-09-30 MED ORDER — HALOPERIDOL LACTATE 2 MG/ML PO CONC: 0.5000 mg | ORAL | Status: DC | PRN

## 2022-09-30 MED ORDER — SODIUM CHLORIDE 0.9 % IV SOLN
500.0000 mg | INTRAVENOUS | Status: DC
  Administered 2022-09-30: 500 mg via INTRAVENOUS
  Filled 2022-09-30: qty 5

## 2022-09-30 MED ORDER — POLYVINYL ALCOHOL 1.4 % OP SOLN
1.0000 [drp] | Freq: Four times a day (QID) | OPHTHALMIC | Status: DC | PRN
Start: 2022-09-30 — End: 2022-09-30

## 2022-09-30 MED ORDER — HALOPERIDOL LACTATE 5 MG/ML IJ SOLN: 0.5000 mg | INTRAMUSCULAR | Status: DC | PRN

## 2022-09-30 MED ORDER — ACETAMINOPHEN 650 MG RE SUPP: 650.0000 mg | Freq: Four times a day (QID) | RECTAL | Status: DC | PRN

## 2022-09-30 MED ORDER — LORAZEPAM 2 MG/ML IJ SOLN: 1.0000 mg | INTRAMUSCULAR | Status: DC | PRN

## 2022-09-30 NOTE — Progress Notes (Signed)
  Echocardiogram 2D Echocardiogram has been performed.  James Coffey 09/30/2022, 9:49 AM

## 2022-09-30 NOTE — TOC Initial Note (Addendum)
Transition of Care [facility]) - Initial/Assessment Note    Patient Details  Name: James Coffey MRN: [id number] Date of Birth: 07-09-1970  Transition of Care [facility]) CM/SW Contact:    Larrie Kass, LCSW Phone Number: 09/30/2022, 11:52 AM  Clinical Narrative:                 Per chart review , pt from home, palliative consulted. TOC will continue to follow for rec.   Adden 2:00pm CSW received message pt's family would like pt to be evaluated for [facility]. CSW made referral to [facility]. TOC to follow.  Expected Discharge Plan:  (TBD) Barriers to Discharge: Continued Medical Work up   Patient Goals and CMS Choice            Expected Discharge Plan and Services                                              Prior Living Arrangements/Services                       Activities of Daily Living Home Assistive Devices/Equipment: Wheelchair ADL Screening (condition at time of admission) Patient's cognitive ability adequate to safely complete daily activities?: Yes Is the patient deaf or have difficulty hearing?: No Does the patient have difficulty seeing, even when wearing glasses/contacts?: No Does the patient have difficulty concentrating, remembering, or making decisions?: No Patient able to express need for assistance with ADLs?: Yes Does the patient have difficulty dressing or bathing?: Yes Independently performs ADLs?: No Communication: Independent Dressing (OT): Needs assistance Is this a change from baseline?: Pre-admission baseline Grooming: Needs assistance Is this a change from baseline?: Pre-admission baseline Feeding: Independent Bathing: Dependent Is this a change from baseline?: Pre-admission baseline Toileting: Needs assistance Is this a change from baseline?: Pre-admission baseline In/Out Bed: Needs assistance Is this a change from baseline?: Pre-admission baseline Walks in Home: Dependent Is this a change from baseline?:  Pre-admission baseline Does the patient have difficulty walking or climbing stairs?: Yes Weakness of Legs: Both Weakness of Arms/Hands: Both  Permission Sought/Granted                  Emotional Assessment              Admission diagnosis:  Sepsis due to pneumonia [J18.9, A41.9] Multifocal pneumonia [J18.9] Urinary tract infection associated with indwelling urethral catheter, initial encounter [W09.811B, N39.0] Pelvic abscess in male [K65.1] Sepsis, due to unspecified organism, unspecified whether acute organ dysfunction present [A41.9] Patient Active Problem List   Diagnosis Date Noted   Sepsis secondary to UTI 09/30/2022   Lobar pneumonia 09/30/2022   Acute metabolic encephalopathy 09/30/2022   Acute respiratory failure with hypoxia 09/30/2022   Demand ischemia 09/30/2022   Urinary tract infection associated with indwelling urethral catheter 09/29/2022   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   Severe sepsis 09/28/2022   Pelvic abscess in male 09/28/2022   Hyperkalemia 09/28/2022   Elevated bilirubin 09/28/2022   Pyelonephritis 08/14/2022   E coli bacteremia 08/13/2022   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   Septic shock 08/13/2022   Status post craniotomy 08/05/2022   AKI (acute kidney injury) 07/23/2022   Rectal cancer metastasized to liver 07/23/2022   Metastasis to brain 07/22/2022   AMS (altered mental status) 07/22/2022   Surgical wound, non healing 04/22/2022   Fascial dehiscence of Al Pimple site  04/22/2022   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   Aortic ectasia, 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   Anemia 03/18/2022   Intra-abdominal abscess 03/16/2022   Hyponatremia 03/16/2022   Mild protein malnutrition 03/16/2022   Hyperglycemia 03/16/2022   Rectal cancer metastasized to lung 02/04/2022   Colorectal carcinoma 10/08/2021   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 05/07/2021   Counseling regarding advance care planning and goals of care 05/07/2021   Port-A-Cath in place  03/05/2021   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    Malnutrition of moderate degree 12/04/2020   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 11/30/2020   Partial-English speaking patient - Prefers [place] or [place] 11/30/2020   Fourniers gangrene 11/30/2020   Perianal abscess    Perirectal abscess 11/03/2020   Cellulitis 11/03/2020   Proctitis 11/03/2020   Sepsis 11/03/2020   Tobacco abuse 11/03/2020   PCP:  Patient, No Pcp Per Pharmacy:   [id number] [place], [place] - [address] AT [facility] [address] [place] [address] Phone: [phone] Fax: [phone]     Social Determinants of Health (SDOH) Social History: SDOH Screenings   Food Insecurity: No Food Insecurity (09/28/2022)  Housing: Low Risk  (09/28/2022)  Transportation Needs: No Transportation Needs (09/28/2022)  Utilities: Not At Risk (09/28/2022)  Depression (PHQ2-9): Medium Risk (01/31/2021)  Tobacco Use: High Risk (09/29/2022)   SDOH Interventions:     Readmission Risk Interventions    03/22/2022   12:38 PM  Readmission Risk Prevention Plan  Transportation Screening Complete  PCP or Specialist Appt within 5-7 Days Complete  Home Care Screening Complete  Medication Review (RN CM) Complete

## 2022-09-30 NOTE — Progress Notes (Addendum)
[employer] engineer, [employer] [facility]) Hospital Liaison Note  Referral received for patient/family interest in [facility].   Patient has been accepted for GIP LOC at [facility] and able to transfer today if the family desires. [facility] liaison spoke with Irfat and the family plans to transfer tomorrow.   I will follow up with family to get consents completed tomorrow.    Please call with any questions or concerns. Thank you  Dionicio Stall, [facility] Liaison [phone]

## 2022-09-30 NOTE — Progress Notes (Addendum)
Patient admitted to 1602 for comfort care, assessment completed, pt nods "no" for pain. PRN medications available and patient monitored, door left open, bed at lowest position, Patient on 5L [place], and family notified by RN about room change. Will continue to monitor and assess patient for comfort needs.  1600-Family member (Irfat) called for update. RN left voice message.

## 2022-09-30 NOTE — Progress Notes (Signed)
Nutrition Brief Note   Visited patient at bedside who is obtunded. His RN reports plans for comfort measures. He is not waking up much. RN reports concerns for aspiration. No family at bedside.   NUTRITION - FOCUSED PHYSICAL EXAM:  Flowsheet Row Most Recent Value  Orbital Region Severe depletion  Upper Arm Region Severe depletion  Thoracic and Lumbar Region Severe depletion  Buccal Region Severe depletion  Temple Region Severe depletion  Clavicle Bone Region Severe depletion  Clavicle and Acromion Bone Region Severe depletion  Scapular Bone Region Unable to assess  Dorsal Hand Unable to assess  [edema]  Patellar Region Severe depletion  Anterior Thigh Region Severe depletion  Posterior Calf Region Severe depletion  Edema (RD Assessment) Unable to assess  Hair Reviewed  Eyes Reviewed  Mouth Reviewed  Skin Reviewed  Nails Reviewed      No interventions at this time Please consult if any nutrition needs arise   Leodis Rains, RDN, LDN  Clinical Nutrition

## 2022-09-30 NOTE — TOC Progression Note (Addendum)
Transition of Care [facility]) - Progression Note    Patient Details  Name: James Coffey MRN: [id number] Date of Birth: 1970/10/24  Transition of Care [facility]) CM/SW Contact  Lavenia Atlas, RN Phone Number: 09/30/2022, 5:10 PM  Clinical Narrative:   Per Danford Bad with ACC, patient has been accepted GIP LOC at [facility] and will transfer to [facility] tomorrow 09/28/2022.   TOC will continue to follow.   Expected Discharge Plan:  (TBD) Barriers to Discharge: Continued Medical Work up  Expected Discharge Plan and Services                                               Social Determinants of Health (SDOH) Interventions SDOH Screenings   Food Insecurity: No Food Insecurity (09/28/2022)  Housing: Low Risk  (09/28/2022)  Transportation Needs: No Transportation Needs (09/28/2022)  Utilities: Not At Risk (09/28/2022)  Depression (PHQ2-9): Medium Risk (01/31/2021)  Tobacco Use: High Risk (09/29/2022)    Readmission Risk Interventions    03/22/2022   12:38 PM  Readmission Risk Prevention Plan  Transportation Screening Complete  PCP or Specialist Appt within 5-7 Days Complete  Home Care Screening Complete  Medication Review (RN CM) Complete

## 2022-09-30 NOTE — Progress Notes (Signed)
Progress Note   Patient: James Coffey ZOX:[id number] DOB: 1970/10/01 DOA: 09/08/2022     2 DOS: the patient was seen and examined on 09/30/2022   Brief hospital course: 52 year old man PMH metastatic rectal cancer to liver, lung, brain, with history of chemo radiation, colostomy radioembolization liver mets and craniotomy presenting with confusion, admitted for sepsis.   Consultants Urology General Surgery Oncology Palliative Medicine PCCM  Procedures   Assessment and Plan: Severe sepsis secondary to sepsis secondary to pelvic abscess, UTI, pneumonia complicated by perirectal abscess pelvic fistula communicating with bladder; with associated acute metabolic encephalopathy and acute hypoxic respiratory failure Significant lactic acid elevation on admission. CT showed pelvic abscess, CT cystogram showed communication with perirectal abscess and cutaneous drainage.  Multifocal right middle lobe, left upper lobe and right lower lobe infiltrates. Patient has worsening hypoxic respiratory failure with shortness of breath and now on 7 L high flow nasal cannula.   Encephalopathic, worse today per nursing.  Confused during interview.  Does not appear to have capacity at this time. Seen by general surgery, urology, oncology.  Consideration was given to IR drain placement however at this time specialists recommending palliative care.  Not a candidate for oncologic treatment. Keep Foley in place per urology.  Foley was replaced 4/21. Complex case, patiently currently full code.  He is confused and I do not think he has capacity.  Palliative care involved with plan for family meeting today. At this point given full scope of care will adjust antibiotics to treat pneumonia given worsening respiratory status and hypoxia.  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 Per Dr. Candise Che not a candidate for oncologic treatment.   Reasonable candidate for hospice.  Demand ischemia secondary to narrow complex tachycardia and wide complex tachycardia No evidence of STEMI.  NSTEMI ruled out. Per chart, on the morning of 4/23, pt was noted to have sustained run of HR in excess of 180-190's, narrow complex. Given dose of IV metoprolol and PO metoprolol. Rhythm later converted to sustained wide complex tachycardia. Pt remained asymptomatic Continued BID metoprolol and added aspirin. Given elevated LFT's, did not order statin Dr. Rhona Leavens discussed and reviewed EKGs with on-call Cardiologist. Given progressive and incurable disease with overall poor prognosis, Cardiology recommended no aggressive intervention at this time. Cardiology agreed with current asa and BB as already ordered and did not recommend anticoagulation Limited 2d echo pending   Hyponatremia, hypokalemia Resolved.   Jaundice with elevated LFTs Secondary to metastatic liver disease.  Serum ammonia was within normal limits.  Autonomic hypotension Stable.  Continue midodrine, and decadron   Atrial fib Not a candidate for anticoagulation currently given acute illness.  In the past felt not to be candidate secondary to prior hx of severe thrombocytopenia, recent craniotomy with surgery On 4/23, noted to have sustained run of narrow complex tachycardia Now on scheduled metoprolol for rate control     Subjective:  Seen, interviewed, examined w/ video interpreter Feels SOB, feels poorly  Physical Exam: Vitals:   09/30/22 0600 09/30/22 0700 09/30/22 0757 09/30/22 0800  BP:  105/72  135/86  Pulse: 69 68  77  Resp: Temp:   98 F (36.7 C)   TempSrc:   Oral   SpO2: 98% 100%  98%  Weight:      Height:       Physical Exam Vitals reviewed.  Constitutional:      General: He is not in  acute distress.    Appearance: He is ill-appearing. He is not toxic-appearing.  HENT:     Head:     Comments: Healing craniotomy incision Cardiovascular:      Rate and Rhythm: Normal rate and regular rhythm.     Heart sounds: No murmur heard. Pulmonary:     Breath sounds: Rhonchi present. No wheezing.     Comments: Mild increased respiratory effort. Coarse breath sounds anterior and posterior Abdominal:     Palpations: Abdomen is soft.     Comments: Colostomy in place  Musculoskeletal:     Right lower leg: No edema.     Left lower leg: No edema.  Neurological:     General: No focal deficit present.     Mental Status: He is alert.  Psychiatric:     Comments: Confused, oriented to self, "", does not identify building. Responses intermittently inappropriate    Data Reviewed: CBG stable WBC down to 14.5.  Hemoglobin stable 11.3.  Platelets down to 112. Troponins 17, 33, 32 EKG 4/23 SR, inferior MI, old; anterolateral ST depression, consider ischemia  Family Communication:   Disposition: Status is: Inpatient Remains inpatient appropriate because: critically ill  Planned Discharge Destination:  TBD    Time spent: 45 minutes  Author: Brendia Sacks, MD 09/30/2022 9:16 AM  For on call review [email or web address].

## 2022-09-30 NOTE — Progress Notes (Signed)
2D limited echo with Definity was attempted, but as noted with previous study, patient is comfort care. RN stated cancel echo.

## 2022-09-30 NOTE — Progress Notes (Signed)
   09/30/22 6045  Therapy Vitals  Pulse Rate 64  Resp 18  Patient Position (if appropriate) Sitting  MEWS Score/Color  MEWS Score 0  MEWS Score Color Green  Respiratory Assessment  $ RT Protocol Assessment  Yes  Respiratory Pattern Dyspnea with exertion;Dyspnea at rest;Labored;Irregular  Chest Assessment Chest expansion symmetrical  Cough (S)  Non-productive (Pt is having an ECHO done at this time, bilateral breath sounds noted rhonchi.  RT to complete evaluation in hopes that pt will cough up sputum for cultures ordered post ECHO.)  Bilateral Breath Sounds Rhonchi  R Upper  Breath Sounds Rhonchi  L Upper Breath Sounds Rhonchi  Oxygen Therapy/Pulse Ox  O2 Device HFNC  O2 Therapy Oxygen humidified  O2 Flow Rate (L/min) 6 L/min  SpO2 96 %  Safety Instructions Yes (Comment)

## 2022-09-30 NOTE — Consult Note (Signed)
Consultation Note Date: 09/30/2022   Patient Name: Tiras Bianchini  DOB: 05-08-1971  MRN: [id number]  Age / Sex: 52 y.o., male  PCP: Patient, No Pcp Per Referring Physician: Standley Brooking, MD  Reason for Consultation:  goals of care. Progressing metastatic cancer now with large pelvic abscess. Poor prognosis.   HPI/Patient Profile: 52 y.o. male  with past medical history of rectal cancer with mets to lung, liver and brain s/p colectomy, chemotherapy, craniotomy, Y90 radioembolization of liver mets, radiation of lung lesions, E coli bacteremia, E coli cysititis admitted on 09/22/2022 with altered mental status- pulling on his foley catheter with bleeding. Workup revealed sepsis due to pneumonia and pelvic abscess which is fistulizing to the bladder. His cancer is progressing and he is not a candidate for further oncologic treatment. 4/23 noted to have ST depression MI. Today he is lethargic and bradycardic, minimally responsive. Palliative consulted for goals of care.     Primary Decision Maker NEXT OF KIN - spouse Worthy Rancher  Discussion: Chart reviewed including labs, progress notes, imaging from this and previous encounters.  Unfortunately, patient has declined further despite current treatments. Per consult with his care team- recommendation is for best support care, comfort measures only, hospice.  I met with his spouse, Worthy Rancher, daughters [name] and [place], brother Karolee Ohs, and sister in law- Irfat.  Family was understandably emotional. Patient is known to be kind, a good man, very loved.  They are of Muslim faith tradition.  Family noted ongoing decline in his functional status to the point that he was primarily bedbound prior to admission. He has lost a great amount of weight.  Patient had been adamant in his desire for full scope of treatments to keep him alive.  We discussed that unfortunately, his body is  succumbing to his multiple illnesses and providers have reached the limits of what medical interventions can do to reverse what appears to be a dying process.  Recommendations made for comfort measures only. Discussed transition to comfort measures only which includes stopping IV fluids, antibiotics, labs and providing symptom management for SOB, anxiety, nausea, vomiting, and other symptoms of dying.  Encouraged patient/family to consider DNR/DNI status understanding evidenced based poor outcomes in similar hospitalized patients, as the cause of the arrest is likely associated with chronic/terminal disease rather than a reversible acute cardio-pulmonary event.  After much discussion- family agreed for DNR and transition to comfort measures only.  Assistance with cultural and spiritual needs offered- they requested that Irfat be notified when he is transferred out of ICU so she can assist with religious needs.  They would like Irfat Habib to be patient's primary contact.  They would like to attempt to have patient transferred to inpatient hospice care- they request [facility] by name. They understand that he is at risk of decompensating to a point of being too unstable for transfer.  Patient's daughter works on 5th floor in this hospital- she requests that patient not be transferred to that floor.   SUMMARY OF RECOMMENDATIONS -DNR- comfort measures  only -D/C labs meds interventions not related to comfort -Transition to full comfort measures 1 mg hydromorphone q1 hr prn for pain or SOB Lorazepam  po, SL or IV q4 hr anxiety Haldol .  po, SL or IV q4 hr PRN agitation Glycopyrrolate .  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  Constitutional:      Appearance: He is ill-appearing.     Comments: frail  Skin:    Coloration: Skin is jaundiced.  Neurological:     Comments: Minimally responsive     Vital Signs: BP 129/78 (BP Location: Left Arm)   Pulse (!) 51   Temp 98 F (36.7 C) (Oral)   Resp (!) 8   Ht  (1.753 m)   Wt 38.6 kg   SpO2 100%   BMI 12.57 kg/m  Pain Scale: 0-10 POSS *See Group Information*: S-Acceptable,Sleep, easy to arouse Pain Score: Asleep   SpO2: SpO2: 100 % O2 Device:SpO2: 100 % O2 Flow Rate: .O2 Flow Rate (L/min): 6 L/min  IO: Intake/output summary:  Intake/Output Summary (Last 24 hours) at 09/30/2022 1247 Last data filed at 09/30/2022 1200 Gross per 24 hour  Intake 3857.75 ml  Output 505 ml  Net 3352.75 ml    LBM: Last BM Date :  (small amount of liquid stool in the bag) Baseline Weight: Weight: 80 kg Most recent weight: Weight: 38.6 kg       Thank you for this consult. Palliative medicine will continue to follow and assist as needed.  Time Total: 120 minutes Greater than 50%  of this time was spent counseling and coordinating care related to the above assessment and plan.  Signed by: Ocie Bob, AGNP-C Palliative Medicine    Please contact Palliative Medicine Team phone at [phone] for questions and concerns.  For individual provider: See Loretha Stapler

## 2022-09-30 NOTE — Progress Notes (Signed)
   09/30/22 1056  Nasal Suctioning/Secretions  Suction Type Nasal (order for gram stain.)  Suction Device Catheter  Secretion Amount Moderate  Secretion Color Yellow  Secretion Consistency Thick  Suction Tolerance Tolerated fairly well  Suctioning Adverse Effects None  Respiratory  Respiratory Interventions Nasal suction   Pt tolerated suctioning fairly well, SP02 99% post.

## 2022-10-01 DIAGNOSIS — K651 Peritoneal abscess: Secondary | ICD-10-CM | POA: Diagnosis not present

## 2022-10-01 DIAGNOSIS — Z515 Encounter for palliative care: Secondary | ICD-10-CM | POA: Diagnosis not present

## 2022-10-01 DIAGNOSIS — N322 Vesical fistula, not elsewhere classified: Secondary | ICD-10-CM

## 2022-10-01 DIAGNOSIS — J181 Lobar pneumonia, unspecified organism: Secondary | ICD-10-CM | POA: Diagnosis not present

## 2022-10-01 DIAGNOSIS — A419 Sepsis, unspecified organism: Secondary | ICD-10-CM | POA: Diagnosis not present

## 2022-10-01 DIAGNOSIS — G9341 Metabolic encephalopathy: Secondary | ICD-10-CM | POA: Diagnosis not present

## 2022-10-01 LAB — CULTURE, RESPIRATORY W GRAM STAIN

## 2022-10-01 MED ORDER — POLYVINYL ALCOHOL 1.4 % OP SOLN
1.0000 [drp] | OPHTHALMIC | Status: DC | PRN
  Filled 2022-10-01: qty 15

## 2022-10-01 MED ORDER — LORAZEPAM 1 MG PO TABS: 1.0000 mg | ORAL_TABLET | ORAL | Status: DC | PRN

## 2022-10-01 MED ORDER — ACETAMINOPHEN 650 MG RE SUPP: 650.0000 mg | Freq: Four times a day (QID) | RECTAL | 0 refills | Status: DC | PRN

## 2022-10-01 MED ORDER — LORAZEPAM 2 MG/ML PO CONC: 1.0000 mg | ORAL | Status: DC | PRN

## 2022-10-01 MED ORDER — ACETAMINOPHEN 325 MG PO TABS: 650.0000 mg | ORAL_TABLET | Freq: Four times a day (QID) | ORAL | Status: DC | PRN

## 2022-10-01 MED ORDER — LORAZEPAM 2 MG/ML IJ SOLN: 1.0000 mg | INTRAMUSCULAR | Status: DC | PRN

## 2022-10-02 ENCOUNTER — Inpatient Hospital Stay: Payer: Medicaid Other

## 2022-10-02 ENCOUNTER — Inpatient Hospital Stay: Payer: Medicaid Other | Admitting: Hematology

## 2022-10-02 ENCOUNTER — Inpatient Hospital Stay: Payer: Medicaid Other | Admitting: Nurse Practitioner

## 2022-10-02 LAB — CULTURE, RESPIRATORY W GRAM STAIN

## 2022-10-03 LAB — CULTURE, BLOOD (ROUTINE X 2): Special Requests: ADEQUATE

## 2022-10-06 ENCOUNTER — Ambulatory Visit (HOSPITAL_COMMUNITY): Payer: Medicaid Other

## 2022-10-06 ENCOUNTER — Other Ambulatory Visit (HOSPITAL_COMMUNITY): Payer: Medicaid Other

## 2022-10-07 NOTE — Progress Notes (Signed)
Patient found with no heartbeat, no respirations at 1106, verified by Vikki Ports, RN and Colvin Caroli, RN.

## 2022-10-07 NOTE — Progress Notes (Signed)
Shared decision has been made with family to move to comfort measures. Foley catheter to stay in place as long as it is providing him palliative benefit. If urine almost completely favors drainage from fistula, foley can be removed at the discretion of primary team. Urology will sign off at this time. Please feel free to contact with any questions.

## 2022-10-07 NOTE — Death Summary Note (Addendum)
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)   Rectal cancer metastasized to lung (HCC)   Hyponatremia   Hyperglycemia   Rectal cancer metastasized to liver (HCC)   Pelvic abscess in male [facility])   Hyperkalemia   Elevated bilirubin   Urinary tract infection associated with indwelling urethral catheter (HCC)   Bladder fistula   Sepsis secondary to UTI (HCC)   Lobar pneumonia (HCC)   Acute metabolic encephalopathy   Acute respiratory failure with hypoxia (HCC)   Demand ischemia   Hospital Course: 52 year old man PMH metastatic rectal cancer to liver, lung, brain, with history of chemo radiation, colostomy radioembolization liver mets and craniotomy presenting with confusion, admitted for sepsis.   Consultants Urology General Surgery Oncology Palliative Medicine PCCM  Procedures  None  Severe sepsis, no shock, secondary to sepsis secondary to pelvic abscess, UTI, pneumonia complicated by perirectal abscess pelvic fistula communicating with bladder; with associated acute metabolic encephalopathy and acute hypoxic respiratory failure Significant lactic acid elevation on admission. CT showed pelvic abscess, CT cystogram showed communication with perirectal abscess and cutaneous drainage.  Multifocal right middle lobe, left upper lobe and right lower lobe infiltrates. Patient had worsening hypoxic respiratory failure with shortness of breath requiring up to 7 L high flow nasal cannula.    Encephalopathy progressed.  Was unable to participate in decision-making. Seen by general surgery, urology, oncology.  Consideration was given to IR drain placement however at this time specialists recommending palliative care.  Not a candidate for oncologic treatment. Keep Foley in place per urology.  Foley was replaced Oct 27, 2022. Full comfort care.  Transfer to residential hospice.   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 Per Dr. Candise Che not a candidate for oncologic treatment.  Reasonable candidate for hospice.   Demand ischemia secondary to narrow complex tachycardia and wide complex tachycardia No evidence of STEMI.  NSTEMI ruled out. Per chart, on the morning of 4/23, pt was noted to have sustained run of HR in excess of 180-190's, narrow complex. Given dose of IV metoprolol and PO metoprolol. Rhythm later converted to sustained wide complex tachycardia. Pt remained asymptomatic Dr. Rhona Leavens discussed and reviewed EKGs with on-call Cardiologist. Given progressive and incurable disease with overall poor prognosis, Cardiology recommended no aggressive intervention at this time. Cardiology agreed with current asa and BB as already ordered and did not recommend anticoagulation Limited 2d echo canceled as will not change management. Comfort care.   Hyponatremia, hypokalemia Resolved.   Jaundice with elevated LFTs Secondary to metastatic liver disease.  Serum ammonia was within normal limits.   Autonomic hypotension Comfort care.   Atrial fib Not a candidate for anticoagulation currently given goals of care.  In the past felt not to be candidate secondary to prior hx of severe thrombocytopenia, recent craniotomy with surgery On 4/23, noted to have sustained run of narrow complex tachycardia      The results of significant diagnostics from this hospitalization (including imaging, microbiology, ancillary  and  laboratory) are listed below for reference.   Significant Diagnostic Studies: ECHOCARDIOGRAM LIMITED  Result Date: 09/30/2022    ECHOCARDIOGRAM LIMITED REPORT   Patient Name:   James Coffey Date of Exam: 09/30/2022 Medical Rec #:  [id number]    Height:       69.0 in Accession #:    [id number]   Weight:       85.1 lb Date of Birth:  07-19-1970    BSA:          1.437 m Patient Age:    52 years     BP:           102/79 mmHg Patient Gender: M            HR:           65 bpm. Exam Location:  Inpatient Procedure: Limited Echo, Cardiac Doppler and Color Doppler Indications:    122-I22.9 Subsequent ST elevation (STEM) and non-ST elevation                 (NSTEMI) myocardial infarction  History:        Patient has prior history of Echocardiogram examinations, most                 recent 08/16/2022. Signs/Symptoms:Altered Mental Status and                 Bacteremia; Risk Factors:Former Smoker. Metastatic cancer.  Sonographer:    Sheralyn Boatman RDCS Referring Phys: [id number] STEPHEN K CHIU  Sonographer Comments: Technically difficult study due to poor echo windows. Patient in Fowler's position. RN stated probable palliative discussion later today. IMPRESSIONS  1. Left ventricular ejection fraction, by estimation, is 40 to 45%. The left ventricle has mildly decreased function. Left ventricular endocardial border not optimally defined to evaluate regional wall motion.  2. Right ventricular systolic function is normal. The right ventricular size is normal.  3. The mitral valve is normal in structure. Trivial mitral valve regurgitation. No evidence of mitral stenosis.  4. The aortic valve is normal in structure. Aortic valve regurgitation is not visualized. No aortic stenosis is present.  5. The inferior vena cava is normal in size with greater than 50% respiratory variability, suggesting right atrial pressure of 3 mmHg.  6. Recommend repeat limited study with definity contrast FINDINGS  Left Ventricle: Left ventricular ejection  fraction, by estimation, is 40 to 45%. The left ventricle has mildly decreased function. Left ventricular endocardial border not optimally defined to evaluate regional wall motion. The left ventricular internal cavity size was normal in size. There is no left ventricular hypertrophy. Right Ventricle: The right ventricular size is normal. No increase in right ventricular wall thickness. Right ventricular systolic function is normal. Left Atrium: Left atrial size was normal in size. Right Atrium: Right atrial size was normal in size. Pericardium: There is no evidence of pericardial effusion. Mitral Valve: The mitral valve is normal in structure. Trivial mitral valve regurgitation. No evidence of mitral valve stenosis. Tricuspid Valve: The tricuspid valve is normal in structure. Tricuspid valve regurgitation is trivial. No evidence of tricuspid stenosis. Aortic Valve: The aortic valve is normal in structure. Aortic valve regurgitation is not visualized. No aortic stenosis is present. Pulmonic Valve: The pulmonic valve was normal in structure. Pulmonic valve regurgitation is not visualized. No evidence of pulmonic stenosis. Aorta: The aortic root is normal in size and structure. Venous: The inferior vena cava is normal in size with greater than 50% respiratory variability, suggesting right  atrial pressure of 3 mmHg. IAS/Shunts: No atrial level shunt detected by color flow Doppler. Additional Comments: Spectral Doppler performed. Color Doppler performed.  LEFT VENTRICLE PLAX 2D LVIDd:         4.50 cm LVIDs:         3.70 cm LV PW:         0.80 cm LV IVS:        0.80 cm  LV Volumes (MOD) LV vol d, MOD A2C: 64.0 ml LV vol d, MOD A4C: 60.8 ml LV vol s, MOD A2C: 33.5 ml LV vol s, MOD A4C: 40.9 ml LV SV MOD A2C:     30.5 ml LV SV MOD A4C:     60.8 ml LV SV MOD BP:      27.5 ml LEFT ATRIUM         Index LA diam:    2.50 cm 1.74 cm/m   AORTA Ao Root diam: 3.10 cm Armanda Magic MD Electronically signed by Armanda Magic MD  Signature Date/Time: 09/30/2022/10:59:46 AM    Final    CT CYSTOGRAM PELVIS  Result Date: 09/29/2022 CLINICAL DATA:  Perirectal abscess with possible fistulous connection to bladder. EXAM: CT CYSTOGRAM (CT ABDOMEN AND PELVIS WITH CONTRAST) TECHNIQUE: Multi-detector CT imaging through the abdomen and pelvis was performed after dilute contrast had been introduced into the bladder for the purposes of performing CT cystography. RADIATION DOSE REDUCTION: This exam was performed according to the departmental dose-optimization program which includes automated exposure control, adjustment of the mA and/or kV according to patient size and/or use of iterative reconstruction technique. CONTRAST:  OMNIPAQUE IOHEXOL 300 MG/ML  SOLN COMPARISON:  09/28/2022. FINDINGS: Lower chest: The heart is normal in size and there is a trace pericardial effusion. Coronary artery calcifications are noted. The distal tip of a central venous catheter is seen in the right atrium. Patchy airspace disease is noted at the lung bases. There is a pulmonary nodule in the right lower lobe measuring 6 mm, axial image 22. Nodules are present in the left upper lobe measuring 3.3 and 1 cm, axial image 9. There is a 7 mm nodule in the left lower lobe, axial image 13. Hepatobiliary: Multiple low-attenuation masses are noted in the liver, the largest measuring 10.1 cm, compatible with metastatic disease. Hyperdense material is present in the gallbladder, possible excreted contrast. No biliary ductal dilatation. Pancreas: Unremarkable. No pancreatic ductal dilatation or surrounding inflammatory changes. Spleen: Normal in size without focal abnormality. Adrenals/Urinary Tract: No adrenal nodule or mass. The kidneys enhance symmetrically. A cyst is noted in the upper pole of the right kidney. No renal calculus or hydronephrosis. There is diffuse bladder wall thickening and colic catheter is in place. A tract is noted between the posterior bladder on the  right and a collection in the presacral space measuring 3.1 x 1.8 cm, possibly representing the rectum and decreased in size from the prior exam, compatible with fistula. Stomach/Bowel: Stomach is within normal limits. There is evidence of partial colectomy with left lower quadrant colostomy and peristomal herniation of large and small bowel. No bowel obstruction, free air, or pneumatosis. The appendix is not seen. Diffuse soft tissue thickening is noted in the perirectal space and presacral space. Vascular/Lymphatic: Aortic atherosclerosis. Enlarged lymph node is present in the gastrohepatic ligament measuring up to 9 mm in short axis diameter. Reproductive: The prostate gland is not well delineated on exam. Other: No abdominopelvic ascites. Musculoskeletal: Degenerative changes are noted in the lumbar spine. No acute or  suspicious osseous abnormality. IMPRESSION: 1. Fistulous communication of the urinary bladder with a collection in the perirectal space and possibly rectum. The collection is reduced in size from the previous exam, now measuring 3.1 x 1.8 cm. 2. Diffuse bladder wall thickening, possible infectious or inflammatory cystitis. 3. Rectal wall thickening with marked soft tissue prominence in the perirectal and presacral spaces, increased from the prior exam, concerning for residual/recurrent tumor. 4. Multiple hepatic masses and left upper and lower lobe pulmonary nodules, compatible with metastatic disease. 5. Airspace disease at the lung bases bilaterally, suggesting infection. 6. Postoperative changes in the pelvis with left lower quadrant colostomy and peristomal herniation of multiple loops of small and large bowel. 7. Coronary artery calcifications and aortic atherosclerosis. 8. Remaining incidental findings as described above. Electronically Signed   By: Thornell Sartorius M.D.   On: 09/29/2022 01:24   CT ABDOMEN PELVIS W CONTRAST  Result Date: 09/28/2022 CLINICAL DATA:  Abdominal pain. EXAM: CT  ABDOMEN AND PELVIS WITH CONTRAST TECHNIQUE: Multidetector CT imaging of the abdomen and pelvis was performed using the standard protocol following bolus administration of intravenous contrast. RADIATION DOSE REDUCTION: This exam was performed according to the departmental dose-optimization program which includes automated exposure control, adjustment of the mA and/or kV according to patient size and/or use of iterative reconstruction technique. CONTRAST:  OMNIPAQUE IOHEXOL 300 MG/ML  SOLN COMPARISON:  August 13, 2022 FINDINGS: Lower chest: Mild multifocal right middle lobe, left upper lobe and right lower lobe infiltrates are seen. A 3.3 cm x 2.1 cm lingular soft tissue mass and adjacent 9 mm diameter lung nodule are seen. Hepatobiliary: Innumerable heterogeneous low-attenuation liver masses of various sizes are seen within an enlarged liver. These are markedly increased in size and number when compared to the prior study. Multiple surgical clips are seen along the anterior aspect of the left lobe of the liver consistent with history of partial hepatectomy. The gallbladder is contracted. No gallstones, gallbladder wall thickening, or biliary dilatation. Pancreas: Unremarkable. No pancreatic ductal dilatation or surrounding inflammatory changes. Spleen: A 9 mm focus of parenchymal low attenuation is seen within the posteromedial aspect of a heterogeneous appearing spleen. Adrenals/Urinary Tract: Adrenal glands are unremarkable. Kidneys are normal in size, without renal calculi or hydronephrosis. A 2.0 cm simple cyst is seen within the anteromedial aspect of the upper right kidney. A Foley catheter is seen within an empty urinary bladder. Stomach/Bowel: Stomach is within normal limits. Postoperative changes are seen with a subsequent left lower quadrant ostomy site. There is an associated large fat containing parastomal hernia. No evidence of bowel wall thickening, distention, or inflammatory changes.  Vascular/Lymphatic: Aortic atherosclerosis. A 1.5 cm lymph node is seen within the region anterior to the suprarenal abdominal aorta and posterior aspect of the left lobe of the liver (axial CT image 30, CT series 2). This represents a new finding. Reproductive: The prostate gland is poorly visualized. Other: A 5.3 cm x 3.5 cm x 6.9 cm complex abscess is seen within the posterior aspect of the pelvis. Is within the presacral region and extends from the region posterior to the urinary bladder to the rectum. Marked severity presacral inflammatory fat stranding is also seen. A fistulous connection to the urinary bladder cannot be excluded. Musculoskeletal: Mild degenerative changes are seen within the lower lumbar spine. IMPRESSION: 1. 5.3 cm x 3.5 cm x 6.9 cm complex abscess within the posterior aspect of the pelvis, as described above. A fistulous connection to the urinary bladder cannot be excluded. 2.  Mild multifocal right middle lobe, left upper lobe and right lower lobe infiltrates. 3. 3.3 cm x 2.1 cm lingular soft tissue mass and adjacent 9 mm diameter lung nodule, consistent with metastatic disease. 4. Innumerable heterogeneous low-attenuation liver masses of various sizes, markedly increased in size and number when compared to the prior study, consistent with worsening metastatic disease. 5. Postoperative changes with a left lower quadrant ostomy site. 6. Large fat containing parastomal hernia. 7. Aortic atherosclerosis. Aortic Atherosclerosis (ICD10-I70.0). Electronically Signed   By: Aram Candela M.D.   On: 09/28/2022 02:42   DG Chest Port 1 View  Result Date: 09/28/2022 CLINICAL DATA:  Metastatic colon cancer EXAM: PORTABLE CHEST 1 VIEW COMPARISON:  08/16/2022 FINDINGS: The lungs are well expanded. Previously noted diffuse airspace infiltrate has largely resolved though nodular opacities persist within the peripheral upper lung zones bilaterally. Pulmonary mass is seen at the left lung base  partially silhouetting the cardiac border corresponding to the lobulated pulmonary mass within the lingula seen on CT examination of 08/10/2022. No pneumothorax or pleural effusion. Right internal jugular chest port tip noted within the superior cavoatrial junction. Cardiac size within normal limits. No acute bone abnormality. IMPRESSION: 1. Near complete resolution of previously noted diffuse airspace infiltrate. Persistent nodular opacities within the peripheral upper lung zones bilaterally. 2. Left basilar pulmonary mass, grossly stable since CT examination of 08/10/2022. Electronically Signed   By: Helyn Numbers M.D.   On: 09/28/2022 01:11   CT HEAD WO CONTRAST ( )  Result Date: 09/22/2022 CLINICAL DATA:  Delirium. EXAM: CT HEAD WITHOUT CONTRAST TECHNIQUE: Contiguous axial images were obtained from the base of the skull through the vertex without intravenous contrast. RADIATION DOSE REDUCTION: This exam was performed according to the departmental dose-optimization program which includes automated exposure control, adjustment of the mA and/or kV according to patient size and/or use of iterative reconstruction technique. COMPARISON:  Head CT 08/10/2022 FINDINGS: Brain: Near complete of the previous postoperative pneumocephalus and blood products. Encephalomalacia in the left frontal lobe. Expected postsurgical changes in the overlying dura. The previous midline shift has resolved. No evidence of acute intracranial hemorrhage or ischemia no intracranial mass on this unenhanced exam. No hydrocephalus. Vascular: Atherosclerosis of skullbase vasculature without hyperdense vessel or abnormal calcification. Skull: Prior left frontal craniotomy. No evidence of a calvarial lesion. Sinuses/Orbits: Similar fluid/mucosal thickening in right side of sphenoid sinus. Paranasal sinuses are otherwise clear. No mastoid effusion. Other: No evidence of scalp fluid collection. IMPRESSION: 1. No acute intracranial abnormality.  2. Encephalomalacia in the left frontal lobe. Near complete resolution of previous postoperative pneumocephalus and blood products. Expected postsurgical changes in the overlying dura. Electronically Signed   By: Narda Rutherford M.D.   On: 10/05/2022 23:52   IR Angiogram Visceral Selective  Result Date: 09/17/2022 INDICATION: Metastatic colorectal carcinoma to the liver. Presenting for Yttrium-90 radioembolization treatment to the right lobe of the liver. EXAM: 1. ULTRASOUND GUIDANCE FOR VASCULAR ACCESS OF THE RIGHT COMMON FEMORAL ARTERY 2. SELECTIVE ARTERIOGRAPHY OF THE PROPER HEPATIC ARTERY 3. ADDITIONAL SELECTIVE ARTERIOGRAPHY OF THE RIGHT HEPATIC ARTERY 4. TRANSCATHETER EMBOLIZATION OF THE LIVER TO TREAT METASTATIC CARCINOMA WITH Y-90 RADIOEMBOLIZATION MEDICATIONS: 2 g IV Mefoxin, 4 mg IV Zofran, 8 mg IV Decadron and 40 mg IV Protonix ANESTHESIA/SEDATION: Moderate (conscious) sedation was employed during this procedure. A total of Versed 2.0 mg and Fentanyl 100 mcg was administered intravenously. Moderate Sedation Time: 46 minutes. The patient's level of consciousness and vital signs were monitored continuously by radiology nursing throughout the procedure  under my direct supervision. CONTRAST:  50 mL Omnipaque 300 FLUOROSCOPY TIME:  Radiation Exposure Index (as provided by the fluoroscopic device): 56 mGy Kerma COMPLICATIONS: None immediate. PROCEDURE: Informed consent was obtained from the patient following explanation of the procedure, risks, benefits and alternatives. The patient understands, agrees and consents for the procedure. All questions were addressed. A time out was performed prior to the initiation of the procedure. Maximal barrier sterile technique utilized including caps, mask, sterile gowns, sterile gloves, large sterile drape, hand hygiene, and Betadine prep. Ultrasound was used to confirm patency of the right common femoral artery. A permanent ultrasound image was saved and recorded.  Ultrasound-guided access of the right common femoral artery was performed with a micropuncture set. A 5 French sheath was placed over a guidewire. A 5 French Cobra catheter was introduced and advanced through the abdominal aorta. This was used to selectively catheterize the celiac axis and further selectively catheterize the common hepatic artery. Selective arteriography was then performed through the 5 French catheter at the level of the proper hepatic artery just beyond the gastroduodenal artery. Repeat selective arteriography was necessary to assess arterial patency and perfusion to hepatic metastatic lesions prior to radioembolization. A microcatheter was then advanced through the 5 French catheter to the level of the right hepatic artery. Selective arteriography was performed through the microcatheter and the catheter positioned at an appropriate level for radioembolization. Radioembolization was then performed through a dedicated infusion system with delivery Y-90 SIR Spheres. The radioactive microspheres were slowly delivered in diluted contrast material through the dedicated delivery system under fluoroscopic guidance. After complete delivery of the dose, the catheter and microcatheter were withdrawn from the body and discarded appropriately. Oblique arteriography was performed through the 5 French sheath. Arteriotomy closure was performed with the Angio-Seal device. FINDINGS: Proper hepatic and right hepatic arteriography demonstrates stable appearance of hepatic arterial supply since prior arteriography on 09/01/2022 and ascending branches of the right hepatic artery supplying two large areas of metastatic disease in the superior right lobe of the liver. The microcatheter was positioned just proximal to ascending branches of the right hepatic artery. A full dose of 26 millicuries of microspheres was able to be delivered to the right lobe. IMPRESSION: Yttrium 90 radioembolization to the right lobe of the  liver with Y-90 SIR Spheres. A dose of 26 millicuries of microspheres was delivered via the right hepatic artery. Electronically Signed   By: Irish Lack M.D.   On: 09/17/2022 08:36   NM Y90 LIVER SPECT THERAPY  Result Date: 09/16/2022 CLINICAL DATA:  Colorectal carcinoma with unresectable liver metastasis. Yttrium 90 radio embolization to the RIGHT hepatic lobe. EXAM: NUCLEAR MEDICINE SPECIAL MED RAD PHYSICS CONS; NUCLEAR MEDICINE RADIO PHARM THERAPY INTRA ARTERIAL; NUCLEAR MEDICINE TREATMENT PROCEDURE; NUCLEAR MEDICINE LIVER SCAN TECHNIQUE: In conjunction with the interventional radiologist a Y- Microsphere dose was calculated utilizing body surface area formulation. Additional #D volumetric calculations and partition methodology applied. 10% dose reduction due to elevated bilirubin. Calculated dose equal 26.8 mCi. Pre therapy MAA liver SPECT scan and CTA were evaluated. Utilizing a microcatheter system, the hepatic artery was selected and Y-90 microspheres were delivered in fractionated aliquots. Radiopharmaceutical was delivered by the interventional radiologist and nuclear radiologist. The patient tolerated procedure well. No adverse effects were noted. Bremsstrahlung planar and SPECT imaging of the abdomen following intrahepatic arterial delivery of Y-90 microsphere was performed. RADIOPHARMACEUTICALS:  28.6 millicurie yttrium 90 microspheres COMPARISON:  None Available. FINDINGS: Y - 90 microspheres therapy as above. First therapy the  right hepatic lobe. Bremsstrahlung planar and SPECT imaging of the abdomen following intrahepatic arterial delivery of Y-59microsphere demonstrates radioactivity localized to the RIGHT hepatic lobe. No evidence of extrahepatic activity. IMPRESSION: Successful Y - 90 microsphere delivery for treatment of unresectable liver metastasis. First therapy to the RIGHT lobe. Bremssstrahlung scan demonstrates activity localized to RIGHT hepatic lobe with no extrahepatic activity  identified. Electronically Signed   By: Genevive Bi M.D.   On: 09/16/2022 13:01   IR EMBO TUMOR ORGAN ISCHEMIA INFARCT INC GUIDE ROADMAPPING  Result Date: 09/16/2022 INDICATION: Metastatic colorectal carcinoma to the liver. Presenting for Yttrium-90 radioembolization treatment to the right lobe of the liver. EXAM: 1. ULTRASOUND GUIDANCE FOR VASCULAR ACCESS OF THE RIGHT COMMON FEMORAL ARTERY 2. SELECTIVE ARTERIOGRAPHY OF THE PROPER HEPATIC ARTERY 3. ADDITIONAL SELECTIVE ARTERIOGRAPHY OF THE RIGHT HEPATIC ARTERY 4. TRANSCATHETER EMBOLIZATION OF THE LIVER TO TREAT METASTATIC CARCINOMA WITH Y-90 RADIOEMBOLIZATION MEDICATIONS: 2 g IV Mefoxin, 4 mg IV Zofran, 8 mg IV Decadron and 40 mg IV Protonix ANESTHESIA/SEDATION: Moderate (conscious) sedation was employed during this procedure. A total of Versed 2.0 mg and Fentanyl 100 mcg was administered intravenously. Moderate Sedation Time: 46 minutes. The patient's level of consciousness and vital signs were monitored continuously by radiology nursing throughout the procedure under my direct supervision. CONTRAST:  50 mL Omnipaque 300 FLUOROSCOPY TIME:  Radiation Exposure Index (as provided by the fluoroscopic device): 56 mGy Kerma COMPLICATIONS: None immediate. PROCEDURE: Informed consent was obtained from the patient following explanation of the procedure, risks, benefits and alternatives. The patient understands, agrees and consents for the procedure. All questions were addressed. A time out was performed prior to the initiation of the procedure. Maximal barrier sterile technique utilized including caps, mask, sterile gowns, sterile gloves, large sterile drape, hand hygiene, and Betadine prep. Ultrasound was used to confirm patency of the right common femoral artery. A permanent ultrasound image was saved and recorded. Ultrasound-guided access of the right common femoral artery was performed with a micropuncture set. A 5 French sheath was placed over a guidewire. A 5  French Cobra catheter was introduced and advanced through the abdominal aorta. This was used to selectively catheterize the celiac axis and further selectively catheterize the common hepatic artery. Selective arteriography was then performed through the 5 French catheter at the level of the proper hepatic artery just beyond the gastroduodenal artery. Repeat selective arteriography was necessary to assess arterial patency and perfusion to hepatic metastatic lesions prior to radioembolization. A microcatheter was then advanced through the 5 French catheter to the level of the right hepatic artery. Selective arteriography was performed through the microcatheter and the catheter positioned at an appropriate level for radioembolization. Radioembolization was then performed through a dedicated infusion system with delivery Y-90 SIR Spheres. The radioactive microspheres were slowly delivered in diluted contrast material through the dedicated delivery system under fluoroscopic guidance. After complete delivery of the dose, the catheter and microcatheter were withdrawn from the body and discarded appropriately. Oblique arteriography was performed through the 5 French sheath. Arteriotomy closure was performed with the Angio-Seal device. FINDINGS: Proper hepatic and right hepatic arteriography demonstrates stable appearance of hepatic arterial supply since prior arteriography on 09/01/2022 and ascending branches of the right hepatic artery supplying two large areas of metastatic disease in the superior right lobe of the liver. The microcatheter was positioned just proximal to ascending branches of the right hepatic artery. A full dose of 26 millicuries of microspheres was able to be delivered to the right lobe. IMPRESSION: Yttrium 90  radioembolization to the right lobe of the liver with Y-90 SIR Spheres. A dose of 26 millicuries of microspheres was delivered via the right hepatic artery. Electronically Signed   By: Irish Lack M.D.   On: 09/16/2022 12:47   IR US Guide Vasc Access Right  Result Date: 09/16/2022 INDICATION: Metastatic colorectal carcinoma to the liver. Presenting for Yttrium-90 radioembolization treatment to the right lobe of the liver. EXAM: 1. ULTRASOUND GUIDANCE FOR VASCULAR ACCESS OF THE RIGHT COMMON FEMORAL ARTERY 2. SELECTIVE ARTERIOGRAPHY OF THE PROPER HEPATIC ARTERY 3. ADDITIONAL SELECTIVE ARTERIOGRAPHY OF THE RIGHT HEPATIC ARTERY 4. TRANSCATHETER EMBOLIZATION OF THE LIVER TO TREAT METASTATIC CARCINOMA WITH Y-90 RADIOEMBOLIZATION MEDICATIONS: 2 g IV Mefoxin, 4 mg IV Zofran, 8 mg IV Decadron and 40 mg IV Protonix ANESTHESIA/SEDATION: Moderate (conscious) sedation was employed during this procedure. A total of Versed 2.0 mg and Fentanyl 100 mcg was administered intravenously. Moderate Sedation Time: 46 minutes. The patient's level of consciousness and vital signs were monitored continuously by radiology nursing throughout the procedure under my direct supervision. CONTRAST:  50 mL Omnipaque 300 FLUOROSCOPY TIME:  Radiation Exposure Index (as provided by the fluoroscopic device): 56 mGy Kerma COMPLICATIONS: None immediate. PROCEDURE: Informed consent was obtained from the patient following explanation of the procedure, risks, benefits and alternatives. The patient understands, agrees and consents for the procedure. All questions were addressed. A time out was performed prior to the initiation of the procedure. Maximal barrier sterile technique utilized including caps, mask, sterile gowns, sterile gloves, large sterile drape, hand hygiene, and Betadine prep. Ultrasound was used to confirm patency of the right common femoral artery. A permanent ultrasound image was saved and recorded. Ultrasound-guided access of the right common femoral artery was performed with a micropuncture set. A 5 French sheath was placed over a guidewire. A 5 French Cobra catheter was introduced and advanced through the abdominal  aorta. This was used to selectively catheterize the celiac axis and further selectively catheterize the common hepatic artery. Selective arteriography was then performed through the 5 French catheter at the level of the proper hepatic artery just beyond the gastroduodenal artery. Repeat selective arteriography was necessary to assess arterial patency and perfusion to hepatic metastatic lesions prior to radioembolization. A microcatheter was then advanced through the 5 French catheter to the level of the right hepatic artery. Selective arteriography was performed through the microcatheter and the catheter positioned at an appropriate level for radioembolization. Radioembolization was then performed through a dedicated infusion system with delivery Y-90 SIR Spheres. The radioactive microspheres were slowly delivered in diluted contrast material through the dedicated delivery system under fluoroscopic guidance. After complete delivery of the dose, the catheter and microcatheter were withdrawn from the body and discarded appropriately. Oblique arteriography was performed through the 5 French sheath. Arteriotomy closure was performed with the Angio-Seal device. FINDINGS: Proper hepatic and right hepatic arteriography demonstrates stable appearance of hepatic arterial supply since prior arteriography on 09/01/2022 and ascending branches of the right hepatic artery supplying two large areas of metastatic disease in the superior right lobe of the liver. The microcatheter was positioned just proximal to ascending branches of the right hepatic artery. A full dose of 26 millicuries of microspheres was able to be delivered to the right lobe. IMPRESSION: Yttrium 90 radioembolization to the right lobe of the liver with Y-90 SIR Spheres. A dose of 26 millicuries of microspheres was delivered via the right hepatic artery. Electronically Signed   By: Irish Lack M.D.   On: 09/16/2022 12:47  IR Angiogram Selective Each  Additional Vessel  Result Date: 09/16/2022 INDICATION: Metastatic colorectal carcinoma to the liver. Presenting for Yttrium-90 radioembolization treatment to the right lobe of the liver. EXAM: 1. ULTRASOUND GUIDANCE FOR VASCULAR ACCESS OF THE RIGHT COMMON FEMORAL ARTERY 2. SELECTIVE ARTERIOGRAPHY OF THE PROPER HEPATIC ARTERY 3. ADDITIONAL SELECTIVE ARTERIOGRAPHY OF THE RIGHT HEPATIC ARTERY 4. TRANSCATHETER EMBOLIZATION OF THE LIVER TO TREAT METASTATIC CARCINOMA WITH Y-90 RADIOEMBOLIZATION MEDICATIONS: 2 g IV Mefoxin, 4 mg IV Zofran, 8 mg IV Decadron and 40 mg IV Protonix ANESTHESIA/SEDATION: Moderate (conscious) sedation was employed during this procedure. A total of Versed 2.0 mg and Fentanyl 100 mcg was administered intravenously. Moderate Sedation Time: 46 minutes. The patient's level of consciousness and vital signs were monitored continuously by radiology nursing throughout the procedure under my direct supervision. CONTRAST:  50 mL Omnipaque 300 FLUOROSCOPY TIME:  Radiation Exposure Index (as provided by the fluoroscopic device): 56 mGy Kerma COMPLICATIONS: None immediate. PROCEDURE: Informed consent was obtained from the patient following explanation of the procedure, risks, benefits and alternatives. The patient understands, agrees and consents for the procedure. All questions were addressed. A time out was performed prior to the initiation of the procedure. Maximal barrier sterile technique utilized including caps, mask, sterile gowns, sterile gloves, large sterile drape, hand hygiene, and Betadine prep. Ultrasound was used to confirm patency of the right common femoral artery. A permanent ultrasound image was saved and recorded. Ultrasound-guided access of the right common femoral artery was performed with a micropuncture set. A 5 French sheath was placed over a guidewire. A 5 French Cobra catheter was introduced and advanced through the abdominal aorta. This was used to selectively catheterize the  celiac axis and further selectively catheterize the common hepatic artery. Selective arteriography was then performed through the 5 French catheter at the level of the proper hepatic artery just beyond the gastroduodenal artery. Repeat selective arteriography was necessary to assess arterial patency and perfusion to hepatic metastatic lesions prior to radioembolization. A microcatheter was then advanced through the 5 French catheter to the level of the right hepatic artery. Selective arteriography was performed through the microcatheter and the catheter positioned at an appropriate level for radioembolization. Radioembolization was then performed through a dedicated infusion system with delivery Y-90 SIR Spheres. The radioactive microspheres were slowly delivered in diluted contrast material through the dedicated delivery system under fluoroscopic guidance. After complete delivery of the dose, the catheter and microcatheter were withdrawn from the body and discarded appropriately. Oblique arteriography was performed through the 5 French sheath. Arteriotomy closure was performed with the Angio-Seal device. FINDINGS: Proper hepatic and right hepatic arteriography demonstrates stable appearance of hepatic arterial supply since prior arteriography on 09/01/2022 and ascending branches of the right hepatic artery supplying two large areas of metastatic disease in the superior right lobe of the liver. The microcatheter was positioned just proximal to ascending branches of the right hepatic artery. A full dose of 26 millicuries of microspheres was able to be delivered to the right lobe. IMPRESSION: Yttrium 90 radioembolization to the right lobe of the liver with Y-90 SIR Spheres. A dose of 26 millicuries of microspheres was delivered via the right hepatic artery. Electronically Signed   By: Irish Lack M.D.   On: 09/16/2022 12:47    Microbiology: Recent Results (from the past 240 hour(s))  Urine Culture (for  pregnant, neutropenic or urologic patients or patients with an indwelling urinary catheter)     Status: Abnormal   Collection Time: 2022/09/30 10:20 PM  Specimen: Urine, Clean Catch  Result Value Ref Range Status   Specimen Description   Final    URINE, CLEAN CATCH Performed at [facility], [address], [place], [place] [address]    Special Requests   Final    NONE Performed at [facility], [address], [place], [place] [address]    Culture >=100,000 COLONIES/mL ESCHERICHIA COLI (A)  Final   Report Status 09/30/2022 FINAL  Final   Organism ID, Bacteria ESCHERICHIA COLI (A)  Final      Susceptibility   Escherichia coli - MIC*    AMPICILLIN <=2 SENSITIVE Sensitive     CEFAZOLIN <=4 SENSITIVE Sensitive     CEFEPIME <=0.12 SENSITIVE Sensitive     CEFTRIAXONE <=0.25 SENSITIVE Sensitive     CIPROFLOXACIN <=0.25 SENSITIVE Sensitive     GENTAMICIN <=1 SENSITIVE Sensitive     IMIPENEM <=0.25 SENSITIVE Sensitive     NITROFURANTOIN <=16 SENSITIVE Sensitive     TRIMETH/SULFA <=20 SENSITIVE Sensitive     AMPICILLIN/SULBACTAM <=2 SENSITIVE Sensitive     PIP/TAZO <=4 SENSITIVE Sensitive     * >=100,000 COLONIES/mL ESCHERICHIA COLI  Blood Culture (routine x 2)     Status: None (Preliminary result)   Collection Time: 09/30/2022 11:47 PM   Specimen: Porta Cath; Blood  Result Value Ref Range Status   Specimen Description PORTA CATH  Final   Special Requests   Final    BOTTLES DRAWN AEROBIC AND ANAEROBIC Blood Culture results may not be optimal due to an inadequate volume of blood received in culture bottles   Culture   Final    NO GROWTH 4 DAYS Performed at [facility], [address] [place], [place] [address]    Report Status PENDING  Incomplete  MRSA Next Gen by PCR, Nasal     Status: None   Collection Time: 09/28/22  6:09 AM   Specimen: Nasal Mucosa; Nasal Swab  Result Value Ref Range Status   MRSA by PCR Next Gen NOT DETECTED NOT  DETECTED Final    Comment: (NOTE) The GeneXpert MRSA Assay (FDA approved for NASAL specimens only), is one component of a comprehensive MRSA colonization surveillance program. It is not intended to diagnose MRSA infection nor to guide or monitor treatment for MRSA infections. Test performance is not FDA approved in patients less than 75 years old. Performed at [facility], [address] [place], [place] [address]   Blood Culture (routine x 2)     Status: None (Preliminary result)   Collection Time: 09/28/22  7:05 AM   Specimen: BLOOD  Result Value Ref Range Status   Specimen Description   Final    BLOOD BLOOD LEFT HAND Performed at [facility], [address], [place], [place] [address]    Special Requests   Final    BOTTLES DRAWN AEROBIC ONLY Blood Culture adequate volume Performed at [facility], [address], [place], [place] [address]    Culture   Final    NO GROWTH 4 DAYS Performed at [facility], [address] [place], [place] [address]    Report Status PENDING  Incomplete  Expectorated Sputum Assessment w Gram Stain, Rflx to Resp Cult     Status: None   Collection Time: 09/30/22  9:13 AM   Specimen: Tracheal Aspirate; Sputum  Result Value Ref Range Status   Specimen Description TRACHEAL ASPIRATE  Final   Special Requests NONE  Final  Sputum evaluation   Final    THIS SPECIMEN IS ACCEPTABLE FOR SPUTUM CULTURE Performed at [facility], [address], [place], [place] [address]    Report Status 09/30/2022 FINAL  Final  Culture, Respiratory w Gram Stain     Status: None   Collection Time: 09/30/22  9:13 AM   Specimen: Tracheal Aspirate  Result Value Ref Range Status   Specimen Description   Final    TRACHEAL ASPIRATE Performed at [facility], [address], [place], [place] [address]    Special Requests   Final    NONE Reflexed from [id number] Performed at  [facility], [address] [place], [place] [address]    Gram Stain   Final    FEW WBC PRESENT, PREDOMINANTLY PMN RARE BUDDING YEAST SEEN RARE YEAST WITH PSEUDOHYPHAE Performed at [facility], [address] [place], [place] [address]    Culture   Final    FEW CANDIDA TROPICALIS FUNGUS (MOLD) ISOLATED, PROBABLE CONTAMINANT/COLONIZER (SAPROPHYTE). CONTACT MICROBIOLOGY IF FURTHER IDENTIFICATION REQUIRED [phone].    Report Status 10/02/2022 FINAL  Final    Time spent: 40 minutes  Signed: Brendia Sacks, MD 09/21/2022

## 2022-10-07 NOTE — Progress Notes (Signed)
Daily Progress Note   Patient Name: James Coffey       Date: 09/30/2022 DOB: Jun 22, 1970  Age: 52 y.o. MRN#: [id number] Attending Physician: Standley Brooking, MD Primary Care Physician: Patient, No Pcp Per Admit Date: 09/09/2022  Reason for Consultation/Follow-up: Establishing goals of care and Terminal Care  Patient Profile/HPI:   52 y.o. male  with past medical history of rectal cancer with mets to lung, liver and brain s/p colectomy, chemotherapy, craniotomy, Y90 radioembolization of liver mets, radiation of lung lesions, E coli bacteremia, E coli cysititis admitted on 09/23/2022 with altered mental status- pulling on his foley catheter with bleeding. Workup revealed sepsis due to pneumonia and pelvic abscess which is fistulizing to the bladder. His cancer is progressing and he is not a candidate for further oncologic treatment. 4/23 noted to have ST depression MI. Today he is lethargic and bradycardic, minimally responsive. Palliative consulted for goals of care.     Subjective: Chart reviewed including labs, progress notes, imaging from this and previous encounters.  Awaiting transfer to [facility].  Patient appears to be actively dying. Has increased respirations and work of breathing. Terminal secretions present. Unresponsive. Peripheral pulses weak. No family at bedside.     Physical Exam Constitutional:      Appearance: He is ill-appearing.  Eyes:     General: Scleral icterus present.  Cardiovascular:     Comments: Weak peripheral pulses Skin:    Coloration: Skin is jaundiced.  Neurological:     Comments: unresponsive             Vital Signs: BP 90/67   Pulse (!) 53   Temp 97.8 F (36.6 C) (Axillary)   Resp 19   Ht  (1.753 m)   Wt 38.6 kg   SpO2 94%   BMI 12.57  kg/m  SpO2: SpO2: 94 % O2 Device: O2 Device: High Flow Nasal Cannula O2 Flow Rate: O2 Flow Rate (L/min): 5 L/min  Intake/output summary:  Intake/Output Summary (Last 24 hours) at 09/16/2022 1009 Last data filed at 09/30/2022 1530 Gross per 24 hour  Intake 699.82 ml  Output 0 ml  Net 699.82 ml   LBM: Last BM Date :  (small amount of liquid stool in the bag) Baseline Weight: Weight: 80 kg Most recent weight: Weight: 38.6 kg  Palliative Assessment/Data: PPS: 10%      Patient Active Problem List   Diagnosis Date Noted   Sepsis secondary to UTI 09/30/2022   Lobar pneumonia 09/30/2022   Acute metabolic encephalopathy 09/30/2022   Acute respiratory failure with hypoxia 09/30/2022   Demand ischemia 09/30/2022   Urinary tract infection associated with indwelling urethral catheter 09/29/2022   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   Severe sepsis 09/28/2022   Pelvic abscess in male 09/28/2022   Hyperkalemia 09/28/2022   Elevated bilirubin 09/28/2022   Pyelonephritis 08/14/2022   E coli bacteremia 08/13/2022   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   Septic shock 08/13/2022   Status post craniotomy 08/05/2022   AKI (acute kidney injury) 07/23/2022   Rectal cancer metastasized to liver 07/23/2022   Metastasis to brain 07/22/2022   AMS (altered mental status) 07/22/2022   Surgical wound, non healing 04/22/2022   Fascial dehiscence of Al Pimple site 04/22/2022   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   Aortic ectasia, 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   Anemia 03/18/2022   Intra-abdominal abscess 03/16/2022   Hyponatremia 03/16/2022   Mild protein malnutrition 03/16/2022   Hyperglycemia 03/16/2022   Rectal cancer metastasized to lung 02/04/2022   Colorectal carcinoma 10/08/2021   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 05/07/2021   Counseling regarding advance care planning and goals of care 05/07/2021   Port-A-Cath in place 03/05/2021   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     Malnutrition of moderate degree 12/04/2020   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 11/30/2020   Partial-English speaking patient - Prefers [place] or [place] 11/30/2020   Fourniers gangrene 11/30/2020   Perianal abscess    Perirectal abscess 11/03/2020   Cellulitis 11/03/2020   Proctitis 11/03/2020   Sepsis 11/03/2020   Tobacco abuse 11/03/2020    Palliative Care Assessment & Plan    Assessment/Recommendations/Plan  Continue current comfort measures- discussed with RN- requested she administer hydromorphone and robinul for symptoms Recommend transfer to [facility] sooner than later- I am concerned he is nearing death   Code Status: DNR  Prognosis:  Hours - Days  Discharge Planning: To Be Determined  Care plan was discussed with care team.  Thank you for allowing the Palliative Medicine Team to assist in the care of this patient.   Greater than 50%  of this time was spent counseling and coordinating care related to the above assessment and plan.  Ocie Bob, AGNP-C Palliative Medicine   Please contact Palliative Medicine Team phone at [phone] for questions and concerns.

## 2022-10-07 NOTE — Progress Notes (Signed)
Civil engineer, contracting [facility]) Hospital Liaison Note  Bed offered and accepted for transfer today to [facility]. Unit RN please call report to [phone] prior to patient leaving the unit. Please send signed DNR and paperwork with patient.   Please leave all ports and IV ports in place.   Call with any questions or concerns. Thank you  Dionicio Stall, [facility] Liaison  [phone]

## 2022-10-07 NOTE — Discharge Summary (Addendum)
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 *  Hospital Course: 52 year old man PMH metastatic rectal cancer to liver, lung, brain, with history of chemo radiation, colostomy radioembolization liver mets and craniotomy presenting with confusion, admitted for sepsis. Treated with IV antibiotics, seen by consultants as below, unfortunately very limited options and specialists recommended palliative care.  After further discussion, James Coffey was made full comfort care.  Transfer to residential hospice.   Consultants Urology General Surgery Oncology Palliative Medicine PCCM  Procedures None  Severe sepsis secondary to sepsis secondary to pelvic abscess, UTI, pneumonia complicated by perirectal abscess pelvic fistula communicating with bladder; with associated acute metabolic encephalopathy and acute hypoxic respiratory failure Significant lactic acid elevation on admission. CT showed pelvic abscess, CT cystogram showed communication with perirectal abscess and cutaneous drainage.  Multifocal right middle lobe, left upper lobe and right lower lobe  infiltrates. James Coffey had worsening hypoxic respiratory failure with shortness of breath requiring up to 7 L high flow nasal cannula.   Encephalopathy progressed.  Was unable to participate in decision-making. Seen by general surgery, urology, oncology.  Consideration was given to IR drain placement however at this time specialists recommending palliative care.  Not a candidate for oncologic treatment. Keep Foley in place per urology.  Foley was replaced 15-Oct-2022. Full comfort care.  Transfer to residential hospice.   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 Per Dr. Candise Che not a candidate for oncologic treatment.  Reasonable candidate for hospice.   Demand ischemia secondary to narrow complex tachycardia and wide complex tachycardia No evidence of STEMI.  NSTEMI ruled out. Per chart, on the morning of 4/23, pt was noted to have sustained run of HR in excess of 180-190's, narrow complex. Given dose of IV metoprolol and PO metoprolol. Rhythm later converted to sustained wide complex tachycardia. Pt remained asymptomatic Dr. Rhona Leavens discussed and reviewed EKGs with on-call Cardiologist. Given progressive and incurable disease with overall poor prognosis, Cardiology recommended no aggressive intervention at this time. Cardiology agreed with current asa and BB as already ordered and did not recommend anticoagulation Limited 2d echo canceled as will not change management. Comfort care.   Hyponatremia, hypokalemia Resolved.   Jaundice with elevated LFTs Secondary to metastatic liver disease.  Serum ammonia was within normal limits.   Autonomic hypotension Comfort care.   Atrial fib Not a candidate for anticoagulation currently given goals of care.  In the past felt not to be candidate secondary to prior hx of severe thrombocytopenia, recent craniotomy with surgery On 4/23, noted to have sustained run of narrow complex  tachycardia  Disposition:  [facility] Diet recommendation:  As desired  DISCHARGE MEDICATION: Allergies as of 10/04/2022       Reactions   Hydrocodone Itching   Pork-derived Products Other (See Comments)   Do not eat pork, James Coffey is ok with receiving heparin products        Medication List     STOP taking these medications    baclofen 10 MG tablet Commonly known as: LIORESAL   benzonatate 100 MG capsule Commonly known as: TESSALON   cyanocobalamin 1000 MCG tablet Commonly known as: VITAMIN B12   dexamethasone 2 MG tablet Commonly known as: DECADRON   doxycycline 100 MG tablet Commonly known as: VIBRA-TABS   levETIRAcetam 500 MG tablet Commonly known as: KEPPRA   midodrine 10 MG tablet Commonly known as: PROAMATINE   nystatin 100000 UNIT/ML suspension Commonly known as: MYCOSTATIN   oxyCODONE 5 MG immediate release tablet Commonly known as: Oxy IR/ROXICODONE   pantoprazole 40 MG tablet Commonly known as: Protonix   polyethylene glycol 17 g packet Commonly known as: MiraLax   prochlorperazine 10 MG tablet Commonly known as: COMPAZINE   tamsulosin 0.4 MG Caps capsule Commonly known as: Flomax       TAKE these medications    acetaminophen 325 MG tablet Commonly known as: TYLENOL Take 2 tablets (650 mg total) by mouth every 6 (six) hours as needed for mild pain (or Fever >/= 101). What changed:  medication strength how much to take when to take this reasons to take this   acetaminophen 650 MG suppository Commonly known as: TYLENOL Place 1 suppository (650 mg total) rectally every 6 (six) hours as needed for mild pain (or Fever >/= 101). What changed: You were already taking a medication with the same name, and this prescription was added. Make sure you understand how and when to take each.   LORazepam 1 MG tablet Commonly known as: ATIVAN Take 1 tablet (1 mg total) by mouth every 4 (four) hours as needed for anxiety. What  changed: when to take this   LORazepam 2 MG/ML concentrated solution Commonly known as: ATIVAN Place 0.5 mLs (1 mg total) under the tongue every 4 (four) hours as needed for anxiety. What changed: You were already taking a medication with the same name, and this prescription was added. Make sure you understand how and when to take each.   LORazepam 2 MG/ML injection Commonly known as: ATIVAN Inject 0.5 mLs (1 mg total) into the vein every 4 (four) hours as needed for anxiety. What changed: You were already taking a medication with the same name, and this prescription was added. Make sure you understand how and when to take each.       Unresponsive to voice   Discharge Exam: Filed Weights   09/16/2022 2135 09/28/22 0600  Weight: 80 kg 38.6 kg   Physical Exam Vitals reviewed.  Constitutional:      General: He is not in acute distress.    Appearance: He is ill-appearing.  Cardiovascular:     Rate and Rhythm: Normal rate and regular rhythm.     Heart sounds: No murmur heard. Pulmonary:     Effort: Pulmonary effort is normal. No respiratory distress.     Breath sounds: No wheezing, rhonchi or rales.  Psychiatric:     Comments: Does not respond to voice or gentle palpation.      Condition at discharge: poor  The results of significant diagnostics from this hospitalization (including imaging, microbiology, ancillary and laboratory) are listed  below for reference.   Imaging Studies: ECHOCARDIOGRAM LIMITED  Result Date: 09/30/2022    ECHOCARDIOGRAM LIMITED REPORT   James Coffey Name:   TORON BOWRING Date of Exam: 09/30/2022 Medical Rec #:  [id number]    Height:       69.0 in Accession #:    [id number]   Weight:       85.1 lb Date of Birth:  [birth year]    BSA:          1.437 m James Coffey Age:    52 years     BP:           102/79 mmHg James Coffey Gender: M            HR:           65 bpm. Exam Location:  Inpatient Procedure: Limited Echo, Cardiac Doppler and Color Doppler Indications:    122-I22.9  Subsequent ST elevation (STEM) and non-ST elevation                 (NSTEMI) myocardial infarction  History:        James Coffey has prior history of Echocardiogram examinations, most                 recent 08/16/2022. Signs/Symptoms:Altered Mental Status and                 Bacteremia; Risk Factors:Former Smoker. Metastatic cancer.  Sonographer:    Sheralyn Boatman RDCS Referring Phys: [id number] STEPHEN K CHIU  Sonographer Comments: Technically difficult study due to poor echo windows. James Coffey in Fowler's position. RN stated probable palliative discussion later today. IMPRESSIONS  1. Left ventricular ejection fraction, by estimation, is 40 to 45%. The left ventricle has mildly decreased function. Left ventricular endocardial border not optimally defined to evaluate regional wall motion.  2. Right ventricular systolic function is normal. The right ventricular size is normal.  3. The mitral valve is normal in structure. Trivial mitral valve regurgitation. No evidence of mitral stenosis.  4. The aortic valve is normal in structure. Aortic valve regurgitation is not visualized. No aortic stenosis is present.  5. The inferior vena cava is normal in size with greater than 50% respiratory variability, suggesting right atrial pressure of 3 mmHg.  6. Recommend repeat limited study with definity contrast FINDINGS  Left Ventricle: Left ventricular ejection fraction, by estimation, is 40 to 45%. The left ventricle has mildly decreased function. Left ventricular endocardial border not optimally defined to evaluate regional wall motion. The left ventricular internal cavity size was normal in size. There is no left ventricular hypertrophy. Right Ventricle: The right ventricular size is normal. No increase in right ventricular wall thickness. Right ventricular systolic function is normal. Left Atrium: Left atrial size was normal in size. Right Atrium: Right atrial size was normal in size. Pericardium: There is no evidence of pericardial effusion.  Mitral Valve: The mitral valve is normal in structure. Trivial mitral valve regurgitation. No evidence of mitral valve stenosis. Tricuspid Valve: The tricuspid valve is normal in structure. Tricuspid valve regurgitation is trivial. No evidence of tricuspid stenosis. Aortic Valve: The aortic valve is normal in structure. Aortic valve regurgitation is not visualized. No aortic stenosis is present. Pulmonic Valve: The pulmonic valve was normal in structure. Pulmonic valve regurgitation is not visualized. No evidence of pulmonic stenosis. Aorta: The aortic root is normal in size and structure. Venous: The inferior vena cava is normal in size with greater than 50% respiratory variability, suggesting right atrial pressure of 3 mmHg.  IAS/Shunts: No atrial level shunt detected by color flow Doppler. Additional Comments: Spectral Doppler performed. Color Doppler performed.  LEFT VENTRICLE PLAX 2D LVIDd:         4.50 cm LVIDs:         3.70 cm LV PW:         0.80 cm LV IVS:        0.80 cm  LV Volumes (MOD) LV vol d, MOD A2C: 64.0 ml LV vol d, MOD A4C: 60.8 ml LV vol s, MOD A2C: 33.5 ml LV vol s, MOD A4C: 40.9 ml LV SV MOD A2C:     30.5 ml LV SV MOD A4C:     60.8 ml LV SV MOD BP:      27.5 ml LEFT ATRIUM         Index LA diam:    2.50 cm 1.74 cm/m   AORTA Ao Root diam: 3.10 cm Armanda Magic MD Electronically signed by Armanda Magic MD Signature Date/Time: 09/30/2022/10:59:46 AM    Final    CT CYSTOGRAM PELVIS  Result Date: 09/29/2022 CLINICAL DATA:  Perirectal abscess with possible fistulous connection to bladder. EXAM: CT CYSTOGRAM (CT ABDOMEN AND PELVIS WITH CONTRAST) TECHNIQUE: Multi-detector CT imaging through the abdomen and pelvis was performed after dilute contrast had been introduced into the bladder for the purposes of performing CT cystography. RADIATION DOSE REDUCTION: This exam was performed according to the departmental dose-optimization program which includes automated exposure control, adjustment of the mA  and/or kV according to James Coffey size and/or use of iterative reconstruction technique. CONTRAST:  OMNIPAQUE IOHEXOL 300 MG/ML  SOLN COMPARISON:  09/28/2022. FINDINGS: Lower chest: The heart is normal in size and there is a trace pericardial effusion. Coronary artery calcifications are noted. The distal tip of a central venous catheter is seen in the right atrium. Patchy airspace disease is noted at the lung bases. There is a pulmonary nodule in the right lower lobe measuring 6 mm, axial image 22. Nodules are present in the left upper lobe measuring 3.3 and 1 cm, axial image 9. There is a 7 mm nodule in the left lower lobe, axial image 13. Hepatobiliary: Multiple low-attenuation masses are noted in the liver, the largest measuring 10.1 cm, compatible with metastatic disease. Hyperdense material is present in the gallbladder, possible excreted contrast. No biliary ductal dilatation. Pancreas: Unremarkable. No pancreatic ductal dilatation or surrounding inflammatory changes. Spleen: Normal in size without focal abnormality. Adrenals/Urinary Tract: No adrenal nodule or mass. The kidneys enhance symmetrically. A cyst is noted in the upper pole of the right kidney. No renal calculus or hydronephrosis. There is diffuse bladder wall thickening and colic catheter is in place. A tract is noted between the posterior bladder on the right and a collection in the presacral space measuring 3.1 x 1.8 cm, possibly representing the rectum and decreased in size from the prior exam, compatible with fistula. Stomach/Bowel: Stomach is within normal limits. There is evidence of partial colectomy with left lower quadrant colostomy and peristomal herniation of large and small bowel. No bowel obstruction, free air, or pneumatosis. The appendix is not seen. Diffuse soft tissue thickening is noted in the perirectal space and presacral space. Vascular/Lymphatic: Aortic atherosclerosis. Enlarged lymph node is present in the gastrohepatic  ligament measuring up to 9 mm in short axis diameter. Reproductive: The prostate gland is not well delineated on exam. Other: No abdominopelvic ascites. Musculoskeletal: Degenerative changes are noted in the lumbar spine. No acute or suspicious osseous abnormality. IMPRESSION: 1.  Fistulous communication of the urinary bladder with a collection in the perirectal space and possibly rectum. The collection is reduced in size from the previous exam, now measuring 3.1 x 1.8 cm. 2. Diffuse bladder wall thickening, possible infectious or inflammatory cystitis. 3. Rectal wall thickening with marked soft tissue prominence in the perirectal and presacral spaces, increased from the prior exam, concerning for residual/recurrent tumor. 4. Multiple hepatic masses and left upper and lower lobe pulmonary nodules, compatible with metastatic disease. 5. Airspace disease at the lung bases bilaterally, suggesting infection. 6. Postoperative changes in the pelvis with left lower quadrant colostomy and peristomal herniation of multiple loops of small and large bowel. 7. Coronary artery calcifications and aortic atherosclerosis. 8. Remaining incidental findings as described above. Electronically Signed   By: Thornell Sartorius M.D.   On: 09/29/2022 01:24   CT ABDOMEN PELVIS W CONTRAST  Result Date: 09/28/2022 CLINICAL DATA:  Abdominal pain. EXAM: CT ABDOMEN AND PELVIS WITH CONTRAST TECHNIQUE: Multidetector CT imaging of the abdomen and pelvis was performed using the standard protocol following bolus administration of intravenous contrast. RADIATION DOSE REDUCTION: This exam was performed according to the departmental dose-optimization program which includes automated exposure control, adjustment of the mA and/or kV according to James Coffey size and/or use of iterative reconstruction technique. CONTRAST:  OMNIPAQUE IOHEXOL 300 MG/ML  SOLN COMPARISON:  August 13, 2022 FINDINGS: Lower chest: Mild multifocal right middle lobe, left upper lobe  and right lower lobe infiltrates are seen. A 3.3 cm x 2.1 cm lingular soft tissue mass and adjacent 9 mm diameter lung nodule are seen. Hepatobiliary: Innumerable heterogeneous low-attenuation liver masses of various sizes are seen within an enlarged liver. These are markedly increased in size and number when compared to the prior study. Multiple surgical clips are seen along the anterior aspect of the left lobe of the liver consistent with history of partial hepatectomy. The gallbladder is contracted. No gallstones, gallbladder wall thickening, or biliary dilatation. Pancreas: Unremarkable. No pancreatic ductal dilatation or surrounding inflammatory changes. Spleen: A 9 mm focus of parenchymal low attenuation is seen within the posteromedial aspect of a heterogeneous appearing spleen. Adrenals/Urinary Tract: Adrenal glands are unremarkable. Kidneys are normal in size, without renal calculi or hydronephrosis. A 2.0 cm simple cyst is seen within the anteromedial aspect of the upper right kidney. A Foley catheter is seen within an empty urinary bladder. Stomach/Bowel: Stomach is within normal limits. Postoperative changes are seen with a subsequent left lower quadrant ostomy site. There is an associated large fat containing parastomal hernia. No evidence of bowel wall thickening, distention, or inflammatory changes. Vascular/Lymphatic: Aortic atherosclerosis. A 1.5 cm lymph node is seen within the region anterior to the suprarenal abdominal aorta and posterior aspect of the left lobe of the liver (axial CT image 30, CT series 2). This represents a new finding. Reproductive: The prostate gland is poorly visualized. Other: A 5.3 cm x 3.5 cm x 6.9 cm complex abscess is seen within the posterior aspect of the pelvis. Is within the presacral region and extends from the region posterior to the urinary bladder to the rectum. Marked severity presacral inflammatory fat stranding is also seen. A fistulous connection to the  urinary bladder cannot be excluded. Musculoskeletal: Mild degenerative changes are seen within the lower lumbar spine. IMPRESSION: 1. 5.3 cm x 3.5 cm x 6.9 cm complex abscess within the posterior aspect of the pelvis, as described above. A fistulous connection to the urinary bladder cannot be excluded. 2. Mild multifocal right middle lobe,  left upper lobe and right lower lobe infiltrates. 3. 3.3 cm x 2.1 cm lingular soft tissue mass and adjacent 9 mm diameter lung nodule, consistent with metastatic disease. 4. Innumerable heterogeneous low-attenuation liver masses of various sizes, markedly increased in size and number when compared to the prior study, consistent with worsening metastatic disease. 5. Postoperative changes with a left lower quadrant ostomy site. 6. Large fat containing parastomal hernia. 7. Aortic atherosclerosis. Aortic Atherosclerosis (ICD10-I70.0). Electronically Signed   By: Aram Candela M.D.   On: 09/28/2022 02:42   DG Chest Port 1 View  Result Date: 09/28/2022 CLINICAL DATA:  Metastatic colon cancer EXAM: PORTABLE CHEST 1 VIEW COMPARISON:  08/16/2022 FINDINGS: The lungs are well expanded. Previously noted diffuse airspace infiltrate has largely resolved though nodular opacities persist within the peripheral upper lung zones bilaterally. Pulmonary mass is seen at the left lung base partially silhouetting the cardiac border corresponding to the lobulated pulmonary mass within the lingula seen on CT examination of 08/10/2022. No pneumothorax or pleural effusion. Right internal jugular chest port tip noted within the superior cavoatrial junction. Cardiac size within normal limits. No acute bone abnormality. IMPRESSION: 1. Near complete resolution of previously noted diffuse airspace infiltrate. Persistent nodular opacities within the peripheral upper lung zones bilaterally. 2. Left basilar pulmonary mass, grossly stable since CT examination of 08/10/2022. Electronically Signed   By: Helyn Numbers M.D.   On: 09/28/2022 01:11   CT HEAD WO CONTRAST ( )  Result Date: 26-Oct-2022 CLINICAL DATA:  Delirium. EXAM: CT HEAD WITHOUT CONTRAST TECHNIQUE: Contiguous axial images were obtained from the base of the skull through the vertex without intravenous contrast. RADIATION DOSE REDUCTION: This exam was performed according to the departmental dose-optimization program which includes automated exposure control, adjustment of the mA and/or kV according to James Coffey size and/or use of iterative reconstruction technique. COMPARISON:  Head CT 08/10/2022 FINDINGS: Brain: Near complete of the previous postoperative pneumocephalus and blood products. Encephalomalacia in the left frontal lobe. Expected postsurgical changes in the overlying dura. The previous midline shift has resolved. No evidence of acute intracranial hemorrhage or ischemia no intracranial mass on this unenhanced exam. No hydrocephalus. Vascular: Atherosclerosis of skullbase vasculature without hyperdense vessel or abnormal calcification. Skull: Prior left frontal craniotomy. No evidence of a calvarial lesion. Sinuses/Orbits: Similar fluid/mucosal thickening in right side of sphenoid sinus. Paranasal sinuses are otherwise clear. No mastoid effusion. Other: No evidence of scalp fluid collection. IMPRESSION: 1. No acute intracranial abnormality. 2. Encephalomalacia in the left frontal lobe. Near complete resolution of previous postoperative pneumocephalus and blood products. Expected postsurgical changes in the overlying dura. Electronically Signed   By: Narda Rutherford M.D.   On: 2022-10-26 23:52   IR Angiogram Visceral Selective  Result Date: 09/17/2022 INDICATION: Metastatic colorectal carcinoma to the liver. Presenting for Yttrium-90 radioembolization treatment to the right lobe of the liver. EXAM: 1. ULTRASOUND GUIDANCE FOR VASCULAR ACCESS OF THE RIGHT COMMON FEMORAL ARTERY 2. SELECTIVE ARTERIOGRAPHY OF THE PROPER HEPATIC ARTERY 3.  ADDITIONAL SELECTIVE ARTERIOGRAPHY OF THE RIGHT HEPATIC ARTERY 4. TRANSCATHETER EMBOLIZATION OF THE LIVER TO TREAT METASTATIC CARCINOMA WITH Y-90 RADIOEMBOLIZATION MEDICATIONS: 2 g IV Mefoxin, 4 mg IV Zofran, 8 mg IV Decadron and 40 mg IV Protonix ANESTHESIA/SEDATION: Moderate (conscious) sedation was employed during this procedure. A total of Versed 2.0 mg and Fentanyl 100 mcg was administered intravenously. Moderate Sedation Time: 46 minutes. The James Coffey's level of consciousness and vital signs were monitored continuously by radiology nursing throughout the procedure under my direct supervision. CONTRAST:  50 mL Omnipaque 300 FLUOROSCOPY TIME:  Radiation Exposure Index (as provided by the fluoroscopic device): 56 mGy Kerma COMPLICATIONS: None immediate. PROCEDURE: Informed consent was obtained from the James Coffey following explanation of the procedure, risks, benefits and alternatives. The James Coffey understands, agrees and consents for the procedure. All questions were addressed. A time out was performed prior to the initiation of the procedure. Maximal barrier sterile technique utilized including caps, mask, sterile gowns, sterile gloves, large sterile drape, hand hygiene, and Betadine prep. Ultrasound was used to confirm patency of the right common femoral artery. A permanent ultrasound image was saved and recorded. Ultrasound-guided access of the right common femoral artery was performed with a micropuncture set. A 5 French sheath was placed over a guidewire. A 5 French Cobra catheter was introduced and advanced through the abdominal aorta. This was used to selectively catheterize the celiac axis and further selectively catheterize the common hepatic artery. Selective arteriography was then performed through the 5 French catheter at the level of the proper hepatic artery just beyond the gastroduodenal artery. Repeat selective arteriography was necessary to assess arterial patency and perfusion to hepatic metastatic  lesions prior to radioembolization. A microcatheter was then advanced through the 5 French catheter to the level of the right hepatic artery. Selective arteriography was performed through the microcatheter and the catheter positioned at an appropriate level for radioembolization. Radioembolization was then performed through a dedicated infusion system with delivery Y-90 SIR Spheres. The radioactive microspheres were slowly delivered in diluted contrast material through the dedicated delivery system under fluoroscopic guidance. After complete delivery of the dose, the catheter and microcatheter were withdrawn from the body and discarded appropriately. Oblique arteriography was performed through the 5 French sheath. Arteriotomy closure was performed with the Angio-Seal device. FINDINGS: Proper hepatic and right hepatic arteriography demonstrates stable appearance of hepatic arterial supply since prior arteriography on 09/01/2022 and ascending branches of the right hepatic artery supplying two large areas of metastatic disease in the superior right lobe of the liver. The microcatheter was positioned just proximal to ascending branches of the right hepatic artery. A full dose of 26 millicuries of microspheres was able to be delivered to the right lobe. IMPRESSION: Yttrium 90 radioembolization to the right lobe of the liver with Y-90 SIR Spheres. A dose of 26 millicuries of microspheres was delivered via the right hepatic artery. Electronically Signed   By: Irish Lack M.D.   On: 09/17/2022 08:36   NM Y90 LIVER SPECT THERAPY  Result Date: 09/16/2022 CLINICAL DATA:  Colorectal carcinoma with unresectable liver metastasis. Yttrium 90 radio embolization to the RIGHT hepatic lobe. EXAM: NUCLEAR MEDICINE SPECIAL MED RAD PHYSICS CONS; NUCLEAR MEDICINE RADIO PHARM THERAPY INTRA ARTERIAL; NUCLEAR MEDICINE TREATMENT PROCEDURE; NUCLEAR MEDICINE LIVER SCAN TECHNIQUE: In conjunction with the interventional radiologist a Y-  Microsphere dose was calculated utilizing body surface area formulation. Additional #D volumetric calculations and partition methodology applied. 10% dose reduction due to elevated bilirubin. Calculated dose equal 26.8 mCi. Pre therapy MAA liver SPECT scan and CTA were evaluated. Utilizing a microcatheter system, the hepatic artery was selected and Y-90 microspheres were delivered in fractionated aliquots. Radiopharmaceutical was delivered by the interventional radiologist and nuclear radiologist. The James Coffey tolerated procedure well. No adverse effects were noted. Bremsstrahlung planar and SPECT imaging of the abdomen following intrahepatic arterial delivery of Y-90 microsphere was performed. RADIOPHARMACEUTICALS:  28.6 millicurie yttrium 90 microspheres COMPARISON:  None Available. FINDINGS: Y - 90 microspheres therapy as above. First therapy the right hepatic lobe. Bremsstrahlung planar and  SPECT imaging of the abdomen following intrahepatic arterial delivery of Y-17microsphere demonstrates radioactivity localized to the RIGHT hepatic lobe. No evidence of extrahepatic activity. IMPRESSION: Successful Y - 90 microsphere delivery for treatment of unresectable liver metastasis. First therapy to the RIGHT lobe. Bremssstrahlung scan demonstrates activity localized to RIGHT hepatic lobe with no extrahepatic activity identified. Electronically Signed   By: Genevive Bi M.D.   On: 09/16/2022 13:01   IR EMBO TUMOR ORGAN ISCHEMIA INFARCT INC GUIDE ROADMAPPING  Result Date: 09/16/2022 INDICATION: Metastatic colorectal carcinoma to the liver. Presenting for Yttrium-90 radioembolization treatment to the right lobe of the liver. EXAM: 1. ULTRASOUND GUIDANCE FOR VASCULAR ACCESS OF THE RIGHT COMMON FEMORAL ARTERY 2. SELECTIVE ARTERIOGRAPHY OF THE PROPER HEPATIC ARTERY 3. ADDITIONAL SELECTIVE ARTERIOGRAPHY OF THE RIGHT HEPATIC ARTERY 4. TRANSCATHETER EMBOLIZATION OF THE LIVER TO TREAT METASTATIC CARCINOMA WITH Y-90  RADIOEMBOLIZATION MEDICATIONS: 2 g IV Mefoxin, 4 mg IV Zofran, 8 mg IV Decadron and 40 mg IV Protonix ANESTHESIA/SEDATION: Moderate (conscious) sedation was employed during this procedure. A total of Versed 2.0 mg and Fentanyl 100 mcg was administered intravenously. Moderate Sedation Time: 46 minutes. The James Coffey's level of consciousness and vital signs were monitored continuously by radiology nursing throughout the procedure under my direct supervision. CONTRAST:  50 mL Omnipaque 300 FLUOROSCOPY TIME:  Radiation Exposure Index (as provided by the fluoroscopic device): 56 mGy Kerma COMPLICATIONS: None immediate. PROCEDURE: Informed consent was obtained from the James Coffey following explanation of the procedure, risks, benefits and alternatives. The James Coffey understands, agrees and consents for the procedure. All questions were addressed. A time out was performed prior to the initiation of the procedure. Maximal barrier sterile technique utilized including caps, mask, sterile gowns, sterile gloves, large sterile drape, hand hygiene, and Betadine prep. Ultrasound was used to confirm patency of the right common femoral artery. A permanent ultrasound image was saved and recorded. Ultrasound-guided access of the right common femoral artery was performed with a micropuncture set. A 5 French sheath was placed over a guidewire. A 5 French Cobra catheter was introduced and advanced through the abdominal aorta. This was used to selectively catheterize the celiac axis and further selectively catheterize the common hepatic artery. Selective arteriography was then performed through the 5 French catheter at the level of the proper hepatic artery just beyond the gastroduodenal artery. Repeat selective arteriography was necessary to assess arterial patency and perfusion to hepatic metastatic lesions prior to radioembolization. A microcatheter was then advanced through the 5 French catheter to the level of the right hepatic artery.  Selective arteriography was performed through the microcatheter and the catheter positioned at an appropriate level for radioembolization. Radioembolization was then performed through a dedicated infusion system with delivery Y-90 SIR Spheres. The radioactive microspheres were slowly delivered in diluted contrast material through the dedicated delivery system under fluoroscopic guidance. After complete delivery of the dose, the catheter and microcatheter were withdrawn from the body and discarded appropriately. Oblique arteriography was performed through the 5 French sheath. Arteriotomy closure was performed with the Angio-Seal device. FINDINGS: Proper hepatic and right hepatic arteriography demonstrates stable appearance of hepatic arterial supply since prior arteriography on 09/01/2022 and ascending branches of the right hepatic artery supplying two large areas of metastatic disease in the superior right lobe of the liver. The microcatheter was positioned just proximal to ascending branches of the right hepatic artery. A full dose of 26 millicuries of microspheres was able to be delivered to the right lobe. IMPRESSION: Yttrium 90 radioembolization to the right lobe of  the liver with Y-90 SIR Spheres. A dose of 26 millicuries of microspheres was delivered via the right hepatic artery. Electronically Signed   By: Irish Lack M.D.   On: 09/16/2022 12:47   IR US Guide Vasc Access Right  Result Date: 09/16/2022 INDICATION: Metastatic colorectal carcinoma to the liver. Presenting for Yttrium-90 radioembolization treatment to the right lobe of the liver. EXAM: 1. ULTRASOUND GUIDANCE FOR VASCULAR ACCESS OF THE RIGHT COMMON FEMORAL ARTERY 2. SELECTIVE ARTERIOGRAPHY OF THE PROPER HEPATIC ARTERY 3. ADDITIONAL SELECTIVE ARTERIOGRAPHY OF THE RIGHT HEPATIC ARTERY 4. TRANSCATHETER EMBOLIZATION OF THE LIVER TO TREAT METASTATIC CARCINOMA WITH Y-90 RADIOEMBOLIZATION MEDICATIONS: 2 g IV Mefoxin, 4 mg IV Zofran, 8 mg IV  Decadron and 40 mg IV Protonix ANESTHESIA/SEDATION: Moderate (conscious) sedation was employed during this procedure. A total of Versed 2.0 mg and Fentanyl 100 mcg was administered intravenously. Moderate Sedation Time: 46 minutes. The James Coffey's level of consciousness and vital signs were monitored continuously by radiology nursing throughout the procedure under my direct supervision. CONTRAST:  50 mL Omnipaque 300 FLUOROSCOPY TIME:  Radiation Exposure Index (as provided by the fluoroscopic device): 56 mGy Kerma COMPLICATIONS: None immediate. PROCEDURE: Informed consent was obtained from the James Coffey following explanation of the procedure, risks, benefits and alternatives. The James Coffey understands, agrees and consents for the procedure. All questions were addressed. A time out was performed prior to the initiation of the procedure. Maximal barrier sterile technique utilized including caps, mask, sterile gowns, sterile gloves, large sterile drape, hand hygiene, and Betadine prep. Ultrasound was used to confirm patency of the right common femoral artery. A permanent ultrasound image was saved and recorded. Ultrasound-guided access of the right common femoral artery was performed with a micropuncture set. A 5 French sheath was placed over a guidewire. A 5 French Cobra catheter was introduced and advanced through the abdominal aorta. This was used to selectively catheterize the celiac axis and further selectively catheterize the common hepatic artery. Selective arteriography was then performed through the 5 French catheter at the level of the proper hepatic artery just beyond the gastroduodenal artery. Repeat selective arteriography was necessary to assess arterial patency and perfusion to hepatic metastatic lesions prior to radioembolization. A microcatheter was then advanced through the 5 French catheter to the level of the right hepatic artery. Selective arteriography was performed through the microcatheter and the  catheter positioned at an appropriate level for radioembolization. Radioembolization was then performed through a dedicated infusion system with delivery Y-90 SIR Spheres. The radioactive microspheres were slowly delivered in diluted contrast material through the dedicated delivery system under fluoroscopic guidance. After complete delivery of the dose, the catheter and microcatheter were withdrawn from the body and discarded appropriately. Oblique arteriography was performed through the 5 French sheath. Arteriotomy closure was performed with the Angio-Seal device. FINDINGS: Proper hepatic and right hepatic arteriography demonstrates stable appearance of hepatic arterial supply since prior arteriography on 09/01/2022 and ascending branches of the right hepatic artery supplying two large areas of metastatic disease in the superior right lobe of the liver. The microcatheter was positioned just proximal to ascending branches of the right hepatic artery. A full dose of 26 millicuries of microspheres was able to be delivered to the right lobe. IMPRESSION: Yttrium 90 radioembolization to the right lobe of the liver with Y-90 SIR Spheres. A dose of 26 millicuries of microspheres was delivered via the right hepatic artery. Electronically Signed   By: Irish Lack M.D.   On: 09/16/2022 12:47   IR Angiogram Selective Each  Additional Vessel  Result Date: 09/16/2022 INDICATION: Metastatic colorectal carcinoma to the liver. Presenting for Yttrium-90 radioembolization treatment to the right lobe of the liver. EXAM: 1. ULTRASOUND GUIDANCE FOR VASCULAR ACCESS OF THE RIGHT COMMON FEMORAL ARTERY 2. SELECTIVE ARTERIOGRAPHY OF THE PROPER HEPATIC ARTERY 3. ADDITIONAL SELECTIVE ARTERIOGRAPHY OF THE RIGHT HEPATIC ARTERY 4. TRANSCATHETER EMBOLIZATION OF THE LIVER TO TREAT METASTATIC CARCINOMA WITH Y-90 RADIOEMBOLIZATION MEDICATIONS: 2 g IV Mefoxin, 4 mg IV Zofran, 8 mg IV Decadron and 40 mg IV Protonix ANESTHESIA/SEDATION: Moderate  (conscious) sedation was employed during this procedure. A total of Versed 2.0 mg and Fentanyl 100 mcg was administered intravenously. Moderate Sedation Time: 46 minutes. The James Coffey's level of consciousness and vital signs were monitored continuously by radiology nursing throughout the procedure under my direct supervision. CONTRAST:  50 mL Omnipaque 300 FLUOROSCOPY TIME:  Radiation Exposure Index (as provided by the fluoroscopic device): 56 mGy Kerma COMPLICATIONS: None immediate. PROCEDURE: Informed consent was obtained from the James Coffey following explanation of the procedure, risks, benefits and alternatives. The James Coffey understands, agrees and consents for the procedure. All questions were addressed. A time out was performed prior to the initiation of the procedure. Maximal barrier sterile technique utilized including caps, mask, sterile gowns, sterile gloves, large sterile drape, hand hygiene, and Betadine prep. Ultrasound was used to confirm patency of the right common femoral artery. A permanent ultrasound image was saved and recorded. Ultrasound-guided access of the right common femoral artery was performed with a micropuncture set. A 5 French sheath was placed over a guidewire. A 5 French Cobra catheter was introduced and advanced through the abdominal aorta. This was used to selectively catheterize the celiac axis and further selectively catheterize the common hepatic artery. Selective arteriography was then performed through the 5 French catheter at the level of the proper hepatic artery just beyond the gastroduodenal artery. Repeat selective arteriography was necessary to assess arterial patency and perfusion to hepatic metastatic lesions prior to radioembolization. A microcatheter was then advanced through the 5 French catheter to the level of the right hepatic artery. Selective arteriography was performed through the microcatheter and the catheter positioned at an appropriate level for  radioembolization. Radioembolization was then performed through a dedicated infusion system with delivery Y-90 SIR Spheres. The radioactive microspheres were slowly delivered in diluted contrast material through the dedicated delivery system under fluoroscopic guidance. After complete delivery of the dose, the catheter and microcatheter were withdrawn from the body and discarded appropriately. Oblique arteriography was performed through the 5 French sheath. Arteriotomy closure was performed with the Angio-Seal device. FINDINGS: Proper hepatic and right hepatic arteriography demonstrates stable appearance of hepatic arterial supply since prior arteriography on 09/01/2022 and ascending branches of the right hepatic artery supplying two large areas of metastatic disease in the superior right lobe of the liver. The microcatheter was positioned just proximal to ascending branches of the right hepatic artery. A full dose of 26 millicuries of microspheres was able to be delivered to the right lobe. IMPRESSION: Yttrium 90 radioembolization to the right lobe of the liver with Y-90 SIR Spheres. A dose of 26 millicuries of microspheres was delivered via the right hepatic artery. Electronically Signed   By: Irish Lack M.D.   On: 09/16/2022 12:47   NM PRE Y90 LIVER SPECT LUNG SHUNT ASSESSMENT  Result Date: 09/02/2022 CLINICAL DATA:  Metastatic colorectal carcinoma with metastasis to the liver. Mass thing and lung shunt fraction calculation prior to planned Y-90 radioembolization treatment of the right hepatic lobe. EXAM: NUCLEAR MEDICINE  LIVER SCAN TECHNIQUE: Abdominal images were obtained in multiple projections after intrahepatic arterial injection of radiopharmaceutical. SPECT imaging was performed. Lung shunt calculation was performed. RADIOPHARMACEUTICALS:  4.68millicurie MAA TECHNETIUM TO 49M ALBUMIN AGGREGATED COMPARISON:  CT chest, abdomen and pelvis 08/10/2022 FINDINGS: The injected microaggregated albumin  localizes within the liver. No evidence of activity within the stomach, duodenum, or bowel. Calculated shunt fraction to the lungs equals 7.4%. IMPRESSION: 1. No significant extrahepatic radiotracer activity following intrahepatic arterial injection of MAA. 2. Lung shunt fraction equals 7.4% Electronically Signed   By: Signa Kell M.D.   On: 09/02/2022 15:39   IR Angiogram Visceral Selective  Result Date: 09/01/2022 INDICATION: Metastatic colorectal carcinoma to the liver with recurrent metastatic disease in the right lobe of the liver. The James Coffey presents for mapping hepatic arteriography, possible embolization and MAA injection for lung shunt fraction calculation prior to planned Y-90 radioembolization treatment of the right lobe of the liver. EXAM: 1. ULTRASOUND GUIDANCE FOR VASCULAR ACCESS OF THE RIGHT COMMON FEMORAL ARTERY 2. SELECTIVE ARTERIOGRAPHY OF THE CELIAC AXIS 3. ADDITIONAL SELECTIVE ARTERIOGRAPHY OF THE PROPER HEPATIC ARTERY 4. ADDITIONAL SELECTIVE ARTERIOGRAPHY OF THE RIGHT HEPATIC ARTERY 5. TECHNETIUM 99-M MAA INJECTION IN THE RIGHT HEPATIC ARTERY MEDICATIONS: None ANESTHESIA/SEDATION: Moderate (conscious) sedation was employed during this procedure. A total of Versed 0.5 mg and Fentanyl 25 mcg was administered intravenously by radiology nursing. Moderate Sedation Time: 53 minutes. The James Coffey's level of consciousness and vital signs were monitored continuously by radiology nursing throughout the procedure under my direct supervision. CONTRAST:  40 mL Omnipaque 300 FLUOROSCOPY TIME:  Radiation Exposure Index (as provided by the fluoroscopic device): 114 mGy Kerma COMPLICATIONS: None immediate. PROCEDURE: Informed consent was obtained from the James Coffey following explanation of the procedure, risks, benefits and alternatives. The James Coffey understands, agrees and consents for the procedure. All questions were addressed. A time out was performed prior to the initiation of the procedure. Maximal  barrier sterile technique utilized including caps, mask, sterile gowns, sterile gloves, large sterile drape, hand hygiene, and chlorhexidine prep. Local anesthesia was provided with 1% lidocaine. Ultrasound was used to confirm patency of the right common femoral artery. A permanent ultrasound image was saved and recorded. Access of the artery was performed with a micropuncture set. A 5 French sheath was placed over a guidewire and utilized for access during the procedure. A 5 French Cobra catheter was advanced into the abdominal aorta. This was used to selectively catheterize the celiac axis. Selective arteriography was performed through the catheter. The Cobra catheter was then used to selectively catheterize the proper hepatic artery. Selective arteriography was performed. A microcatheter was advanced through the 5 French catheter into the right hepatic artery. Selective arteriography was performed through the microcatheter. A dose of technetium 99-M MAA was obtained from Nuclear Medicine and injected through the microcatheter into the right hepatic artery utilizing sterile technique. The microcatheter was fully flushed with sterile saline and both microcatheter and outer diagnostic catheter removed. Oblique arteriography was performed at the femoral puncture site through the 5 French sheath. Arteriotomy closure was performed with the Angio-Seal device. FINDINGS: Celiac arteriography demonstrates traditional branching anatomy of the celiac axis with patent bifurcation into common hepatic and splenic arteries. The common hepatic artery demonstrates bifurcation into gastroduodenal and proper hepatic arteries. Proper hepatic arteriography demonstrates normal bifurcation into right and left hepatic arteries. Relative hypovascular defects with some peripheral enhancement noted in the superior aspect of the right lobe corresponding to known metastatic lesions. Supply appears to be entirely from  right hepatic arterial  branches with no significant supply identified from the left hepatic artery. Additional right hepatic arteriography demonstrates tumor supply ascending in the right lobe. There are some descending branches supplying the inferior aspect of the right lobe without clear tumor supply and probable descending cystic artery supplying the gallbladder. After MAA injection into the right hepatic artery, the James Coffey was transported to nuclear medicine for additional imaging which will include quantitative lung shunt fraction calculation. Nuclear medicine imaging will be reported separately. IMPRESSION: Mapping hepatic arteriography performed prior to planned right lobe radioembolization treatment in 2 weeks. Supply to 2 large areas of metastatic disease in the right lobe of the liver visible by arteriography and appear to be supplied by right hepatic arterial branches. MAA injection was performed in the right hepatic artery for nuclear medicine imaging and lung shunt fraction calculation. Electronically Signed   By: Irish Lack M.D.   On: 09/01/2022 11:59   IR Angiogram Selective Each Additional Vessel  Result Date: 09/01/2022 INDICATION: Metastatic colorectal carcinoma to the liver with recurrent metastatic disease in the right lobe of the liver. The James Coffey presents for mapping hepatic arteriography, possible embolization and MAA injection for lung shunt fraction calculation prior to planned Y-90 radioembolization treatment of the right lobe of the liver. EXAM: 1. ULTRASOUND GUIDANCE FOR VASCULAR ACCESS OF THE RIGHT COMMON FEMORAL ARTERY 2. SELECTIVE ARTERIOGRAPHY OF THE CELIAC AXIS 3. ADDITIONAL SELECTIVE ARTERIOGRAPHY OF THE PROPER HEPATIC ARTERY 4. ADDITIONAL SELECTIVE ARTERIOGRAPHY OF THE RIGHT HEPATIC ARTERY 5. TECHNETIUM 99-M MAA INJECTION IN THE RIGHT HEPATIC ARTERY MEDICATIONS: None ANESTHESIA/SEDATION: Moderate (conscious) sedation was employed during this procedure. A total of Versed 0.5 mg and Fentanyl 25  mcg was administered intravenously by radiology nursing. Moderate Sedation Time: 53 minutes. The James Coffey's level of consciousness and vital signs were monitored continuously by radiology nursing throughout the procedure under my direct supervision. CONTRAST:  40 mL Omnipaque 300 FLUOROSCOPY TIME:  Radiation Exposure Index (as provided by the fluoroscopic device): 114 mGy Kerma COMPLICATIONS: None immediate. PROCEDURE: Informed consent was obtained from the James Coffey following explanation of the procedure, risks, benefits and alternatives. The James Coffey understands, agrees and consents for the procedure. All questions were addressed. A time out was performed prior to the initiation of the procedure. Maximal barrier sterile technique utilized including caps, mask, sterile gowns, sterile gloves, large sterile drape, hand hygiene, and chlorhexidine prep. Local anesthesia was provided with 1% lidocaine. Ultrasound was used to confirm patency of the right common femoral artery. A permanent ultrasound image was saved and recorded. Access of the artery was performed with a micropuncture set. A 5 French sheath was placed over a guidewire and utilized for access during the procedure. A 5 French Cobra catheter was advanced into the abdominal aorta. This was used to selectively catheterize the celiac axis. Selective arteriography was performed through the catheter. The Cobra catheter was then used to selectively catheterize the proper hepatic artery. Selective arteriography was performed. A microcatheter was advanced through the 5 French catheter into the right hepatic artery. Selective arteriography was performed through the microcatheter. A dose of technetium 99-M MAA was obtained from [facility] and injected through the microcatheter into the right hepatic artery utilizing sterile technique. The microcatheter was fully flushed with sterile saline and both microcatheter and outer diagnostic catheter removed. Oblique  arteriography was performed at the femoral puncture site through the 5 French sheath. Arteriotomy closure was performed with the Angio-Seal device. FINDINGS: Celiac arteriography demonstrates traditional branching anatomy of the celiac axis  with patent bifurcation into common hepatic and splenic arteries. The common hepatic artery demonstrates bifurcation into gastroduodenal and proper hepatic arteries. Proper hepatic arteriography demonstrates normal bifurcation into right and left hepatic arteries. Relative hypovascular defects with some peripheral enhancement noted in the superior aspect of the right lobe corresponding to known metastatic lesions. Supply appears to be entirely from right hepatic arterial branches with no significant supply identified from the left hepatic artery. Additional right hepatic arteriography demonstrates tumor supply ascending in the right lobe. There are some descending branches supplying the inferior aspect of the right lobe without clear tumor supply and probable descending cystic artery supplying the gallbladder. After MAA injection into the right hepatic artery, the James Coffey was transported to nuclear medicine for additional imaging which will include quantitative lung shunt fraction calculation. Nuclear medicine imaging will be reported separately. IMPRESSION: Mapping hepatic arteriography performed prior to planned right lobe radioembolization treatment in 2 weeks. Supply to 2 large areas of metastatic disease in the right lobe of the liver visible by arteriography and appear to be supplied by right hepatic arterial branches. MAA injection was performed in the right hepatic artery for nuclear medicine imaging and lung shunt fraction calculation. Electronically Signed   By: Irish Lack M.D.   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 The James Coffey presents for mapping hepatic arteriography, possible embolization and MAA injection for lung shunt fraction calculation prior to planned Y-90 radioembolization treatment of the right lobe of the liver. EXAM: 1. ULTRASOUND GUIDANCE FOR VASCULAR ACCESS OF THE RIGHT COMMON FEMORAL ARTERY 2. SELECTIVE ARTERIOGRAPHY OF THE CELIAC AXIS 3. ADDITIONAL SELECTIVE ARTERIOGRAPHY OF THE PROPER HEPATIC ARTERY 4. ADDITIONAL SELECTIVE ARTERIOGRAPHY OF THE RIGHT HEPATIC ARTERY 5. TECHNETIUM 99-M MAA INJECTION IN THE RIGHT HEPATIC ARTERY MEDICATIONS: None ANESTHESIA/SEDATION: Moderate (conscious) sedation was employed during this procedure. A total of Versed 0.5 mg and Fentanyl 25 mcg was administered intravenously by radiology nursing. Moderate Sedation Time: 53 minutes. The James Coffey's level of consciousness and vital signs were monitored continuously by radiology nursing throughout the procedure under my direct supervision. CONTRAST:  40 mL Omnipaque 300 FLUOROSCOPY TIME:  Radiation Exposure Index (as provided by the fluoroscopic device): 114 mGy Kerma COMPLICATIONS: None immediate. PROCEDURE: Informed consent was obtained from the James Coffey following explanation of the procedure, risks, benefits and alternatives. The James Coffey understands, agrees and consents for the procedure. All questions were addressed. A time out was performed prior to the initiation of the procedure. Maximal barrier sterile technique utilized including caps, mask, sterile gowns, sterile gloves, large sterile drape, hand hygiene, and chlorhexidine prep. Local anesthesia was provided with 1% lidocaine. Ultrasound was used to confirm patency of the right common femoral artery. A permanent ultrasound image was saved and recorded. Access of the artery was performed with a micropuncture set. A 5 French sheath was placed over a guidewire and utilized for access during the procedure. A 5 French Cobra catheter was advanced into  the abdominal aorta. This was used to selectively catheterize the celiac axis. Selective arteriography was performed through the catheter. The Cobra catheter was then used to selectively catheterize the proper hepatic artery. Selective arteriography was performed. A microcatheter was advanced through the 5 French catheter into the right hepatic artery. Selective arteriography was performed through the microcatheter. A dose of technetium 99-M MAA was obtained from Nuclear Medicine and injected through  the microcatheter into the right hepatic artery utilizing sterile technique. The microcatheter was fully flushed with sterile saline and both microcatheter and outer diagnostic catheter removed. Oblique arteriography was performed at the femoral puncture site through the 5 French sheath. Arteriotomy closure was performed with the Angio-Seal device. FINDINGS: Celiac arteriography demonstrates traditional branching anatomy of the celiac axis with patent bifurcation into common hepatic and splenic arteries. The common hepatic artery demonstrates bifurcation into gastroduodenal and proper hepatic arteries. Proper hepatic arteriography demonstrates normal bifurcation into right and left hepatic arteries. Relative hypovascular defects with some peripheral enhancement noted in the superior aspect of the right lobe corresponding to known metastatic lesions. Supply appears to be entirely from right hepatic arterial branches with no significant supply identified from the left hepatic artery. Additional right hepatic arteriography demonstrates tumor supply ascending in the right lobe. There are some descending branches supplying the inferior aspect of the right lobe without clear tumor supply and probable descending cystic artery supplying the gallbladder. After MAA injection into the right hepatic artery, the James Coffey was transported to nuclear medicine for additional imaging which will include quantitative lung shunt fraction  calculation. Nuclear medicine imaging will be reported separately. IMPRESSION: Mapping hepatic arteriography performed prior to planned right lobe radioembolization treatment in 2 weeks. Supply to 2 large areas of metastatic disease in the right lobe of the liver visible by arteriography and appear to be supplied by right hepatic arterial branches. MAA injection was performed in the right hepatic artery for nuclear medicine imaging and lung shunt fraction calculation. Electronically Signed   By: Irish Lack M.D.   On: 09/01/2022 11:59   IR US Guide Vasc Access Right  Result Date: 09/01/2022 INDICATION: Metastatic colorectal carcinoma to the liver with recurrent metastatic disease in the right lobe of the liver. The James Coffey presents for mapping hepatic arteriography, possible embolization and MAA injection for lung shunt fraction calculation prior to planned Y-90 radioembolization treatment of the right lobe of the liver. EXAM: 1. ULTRASOUND GUIDANCE FOR VASCULAR ACCESS OF THE RIGHT COMMON FEMORAL ARTERY 2. SELECTIVE ARTERIOGRAPHY OF THE CELIAC AXIS 3. ADDITIONAL SELECTIVE ARTERIOGRAPHY OF THE PROPER HEPATIC ARTERY 4. ADDITIONAL SELECTIVE ARTERIOGRAPHY OF THE RIGHT HEPATIC ARTERY 5. TECHNETIUM 99-M MAA INJECTION IN THE RIGHT HEPATIC ARTERY MEDICATIONS: None ANESTHESIA/SEDATION: Moderate (conscious) sedation was employed during this procedure. A total of Versed 0.5 mg and Fentanyl 25 mcg was administered intravenously by radiology nursing. Moderate Sedation Time: 53 minutes. The James Coffey's level of consciousness and vital signs were monitored continuously by radiology nursing throughout the procedure under my direct supervision. CONTRAST:  40 mL Omnipaque 300 FLUOROSCOPY TIME:  Radiation Exposure Index (as provided by the fluoroscopic device): 114 mGy Kerma COMPLICATIONS: None immediate. PROCEDURE: Informed consent was obtained from the James Coffey following explanation of the procedure, risks, benefits and  alternatives. The James Coffey understands, agrees and consents for the procedure. All questions were addressed. A time out was performed prior to the initiation of the procedure. Maximal barrier sterile technique utilized including caps, mask, sterile gowns, sterile gloves, large sterile drape, hand hygiene, and chlorhexidine prep. Local anesthesia was provided with 1% lidocaine. Ultrasound was used to confirm patency of the right common femoral artery. A permanent ultrasound image was saved and recorded. Access of the artery was performed with a micropuncture set. A 5 French sheath was placed over a guidewire and utilized for access during the procedure. A 5 French Cobra catheter was advanced into the abdominal aorta. This was used to selectively catheterize the celiac axis.  Selective arteriography was performed through the catheter. The Cobra catheter was then used to selectively catheterize the proper hepatic artery. Selective arteriography was performed. A microcatheter was advanced through the 5 French catheter into the right hepatic artery. Selective arteriography was performed through the microcatheter. A dose of technetium 99-M MAA was obtained from Nuclear Medicine and injected through the microcatheter into the right hepatic artery utilizing sterile technique. The microcatheter was fully flushed with sterile saline and both microcatheter and outer diagnostic catheter removed. Oblique arteriography was performed at the femoral puncture site through the 5 French sheath. Arteriotomy closure was performed with the Angio-Seal device. FINDINGS: Celiac arteriography demonstrates traditional branching anatomy of the celiac axis with patent bifurcation into common hepatic and splenic arteries. The common hepatic artery demonstrates bifurcation into gastroduodenal and proper hepatic arteries. Proper hepatic arteriography demonstrates normal bifurcation into right and left hepatic arteries. Relative hypovascular defects  with some peripheral enhancement noted in the superior aspect of the right lobe corresponding to known metastatic lesions. Supply appears to be entirely from right hepatic arterial branches with no significant supply identified from the left hepatic artery. Additional right hepatic arteriography demonstrates tumor supply ascending in the right lobe. There are some descending branches supplying the inferior aspect of the right lobe without clear tumor supply and probable descending cystic artery supplying the gallbladder. After MAA injection into the right hepatic artery, the James Coffey was transported to nuclear medicine for additional imaging which will include quantitative lung shunt fraction calculation. Nuclear medicine imaging will be reported separately. IMPRESSION: Mapping hepatic arteriography performed prior to planned right lobe radioembolization treatment in 2 weeks. Supply to 2 large areas of metastatic disease in the right lobe of the liver visible by arteriography and appear to be supplied by right hepatic arterial branches. MAA injection was performed in the right hepatic artery for nuclear medicine imaging and lung shunt fraction calculation. Electronically Signed   By: Irish Lack M.D.   On: 09/01/2022 11:59    Microbiology: Results for orders placed or performed during the hospital encounter of Oct 04, 2022  Urine Culture (for pregnant, neutropenic or urologic patients or patients with an indwelling urinary catheter)     Status: Abnormal   Collection Time: 10-04-2022 10:20 PM   Specimen: Urine, Clean Catch  Result Value Ref Range Status   Specimen Description   Final    URINE, CLEAN CATCH Performed at [facility], [address] [place], [place] [address]    Special Requests   Final    NONE Performed at [facility], [name], [place], [place] [address]    Culture >=100,000 COLONIES/mL ESCHERICHIA COLI (A)  Final   Report Status 09/30/2022  FINAL  Final   Organism ID, Bacteria ESCHERICHIA COLI (A)  Final      Susceptibility   Escherichia coli - MIC*    AMPICILLIN <=2 SENSITIVE Sensitive     CEFAZOLIN <=4 SENSITIVE Sensitive     CEFEPIME <=0.12 SENSITIVE Sensitive     CEFTRIAXONE <=0.25 SENSITIVE Sensitive     CIPROFLOXACIN <=0.25 SENSITIVE Sensitive     GENTAMICIN <=1 SENSITIVE Sensitive     IMIPENEM <=0.25 SENSITIVE Sensitive     NITROFURANTOIN <=16 SENSITIVE Sensitive     TRIMETH/SULFA <=20 SENSITIVE Sensitive     AMPICILLIN/SULBACTAM <=2 SENSITIVE Sensitive     PIP/TAZO <=4 SENSITIVE Sensitive     * >=100,000 COLONIES/mL ESCHERICHIA COLI  Blood Culture (routine x 2)     Status: None (Preliminary result)  Collection Time: 2022-10-18 11:47 PM   Specimen: Porta Cath; Blood  Result Value Ref Range Status   Specimen Description PORTA CATH  Final   Special Requests   Final    BOTTLES DRAWN AEROBIC AND ANAEROBIC Blood Culture results may not be optimal due to an inadequate volume of blood received in culture bottles   Culture   Final    NO GROWTH 3 DAYS Performed at [facility], [address], [place], [place] [address]    Report Status PENDING  Incomplete  MRSA Next Gen by PCR, Nasal     Status: None   Collection Time: 09/28/22  6:09 AM   Specimen: Nasal Mucosa; Nasal Swab  Result Value Ref Range Status   MRSA by PCR Next Gen NOT DETECTED NOT DETECTED Final    Comment: (NOTE) The GeneXpert MRSA Assay (FDA approved for NASAL specimens only), is one component of a comprehensive MRSA colonization surveillance program. It is not intended to diagnose MRSA infection nor to guide or monitor treatment for MRSA infections. Test performance is not FDA approved in patients less than 3 years old. Performed at [facility], [address], [place], [place] [address]   Blood Culture (routine x 2)     Status: None (Preliminary result)   Collection Time: 09/28/22  7:05 AM   Specimen: BLOOD  Result  Value Ref Range Status   Specimen Description   Final    BLOOD BLOOD LEFT HAND Performed at [facility], [address], [place], [place] [address]    Special Requests   Final    BOTTLES DRAWN AEROBIC ONLY Blood Culture adequate volume Performed at [facility], [address] [place], [place] [address]    Culture   Final    NO GROWTH 3 DAYS Performed at [facility], [address], [place], [place] [address]    Report Status PENDING  Incomplete  Expectorated Sputum Assessment w Gram Stain, Rflx to Resp Cult     Status: None   Collection Time: 09/30/22  9:13 AM   Specimen: Tracheal Aspirate; Sputum  Result Value Ref Range Status   Specimen Description TRACHEAL ASPIRATE  Final   Special Requests NONE  Final   Sputum evaluation   Final    THIS SPECIMEN IS ACCEPTABLE FOR SPUTUM CULTURE Performed at [facility], [address], [place], [place] [address]    Report Status 09/30/2022 FINAL  Final  Culture, Respiratory w Gram Stain     Status: None (Preliminary result)   Collection Time: 09/30/22  9:13 AM   Specimen: Tracheal Aspirate  Result Value Ref Range Status   Specimen Description   Final    TRACHEAL ASPIRATE Performed at [facility], [address] [place], [place] [address]    Special Requests   Final    NONE Reflexed from [id number] Performed at [facility], [address] [place], [place] [address]    Gram Stain   Final    FEW WBC PRESENT, PREDOMINANTLY PMN RARE BUDDING YEAST SEEN RARE YEAST WITH PSEUDOHYPHAE Performed at [facility], [address] [place], [place] [address]    Culture PENDING  Incomplete   Report Status PENDING  Incomplete    Labs: CBC: Recent Labs  Lab Oct 18, 2022 2220 09/28/22 0806 09/29/22 0546 09/30/22 0831  WBC 23.4* 18.7* 17.0* 14.5*  NEUTROABS 21.4* 17.4*  --   --   HGB 13.0 10.3* 11.9*  11.3*  HCT 41.9 33.1* 39.8 38.0*   MCV 101.2* 100.6* 106.7* 106.7*  PLT 323 224 179 112*   Basic Metabolic Panel: Recent Labs  Lab Oct 27, 2022 2220 09/28/22 0530 09/29/22 0533 09/29/22 0546 09/30/22 0831  NA 130* 133*  --  137 138  K 5.2* 4.2  --  4.7 4.5  CL 95* 93*  --  100 108  CO2 18* 25  --  20* 18*  GLUCOSE 200* 122*  --  110* 101*  BUN 32* 29*  --  31* 29*  CREATININE 0.77 0.70  --  0.66 0.48*  CALCIUM 9.0 8.5*  --  8.4* 8.4*  MG  --   --  2.3  --   --    Liver Function Tests: Recent Labs  Lab Oct 27, 2022 2220 09/28/22 0530 09/29/22 0546 09/30/22 0831  AST 51* 64* 542* 772*  ALT 40 44 321* 544*  ALKPHOS 331* 248* 252* 241*  BILITOT 3.2* 3.0* 3.2* 3.6*  PROT 6.7 5.7* 5.6* 5.3*  ALBUMIN 2.8* 2.4* 2.4* 2.2*   CBG: Recent Labs  Lab 09/29/22 2006 09/29/22 2325 09/30/22 0352 09/30/22 0755 09/30/22 1110  GLUCAP 116* 101* 84 85 113*    Discharge time spent: greater than 30 minutes.  Signed: Brendia Sacks, MD [facility] 10/03/2022

## 2022-10-07 DEATH — deceased

## 2022-11-03 ENCOUNTER — Ambulatory Visit (HOSPITAL_COMMUNITY): Payer: Medicaid Other

## 2022-11-05 ENCOUNTER — Ambulatory Visit: Payer: Medicaid Other | Admitting: Internal Medicine

## 2022-11-09 ENCOUNTER — Ambulatory Visit: Payer: Self-pay | Admitting: Radiation Oncology

## 2023-05-18 IMAGING — MR MR PELVIS W/O CM
7 series · 45 of 48 positions shown · non-contrast
Comparison: Abdominopelvic CTs, most recent 11/30/2020

CLINICAL DATA: Rectal cancer. Presented with Caramia gangrene in
[REDACTED]. Recent colostomy. Patient with ongoing pain.

EXAM:
MRI PELVIS WITHOUT CONTRAST
TECHNIQUE: Multiplanar multisequence MR imaging of the pelvis was performed. No
intravenous contrast was administered. Ultrasound gel was
administered per rectum to optimize tumor evaluation.

[Series 2: T2 · sagittal · 3.0mm · 0.74mm/px · 5 of 43 slices shown (1 of 5)]
[im 1/43]
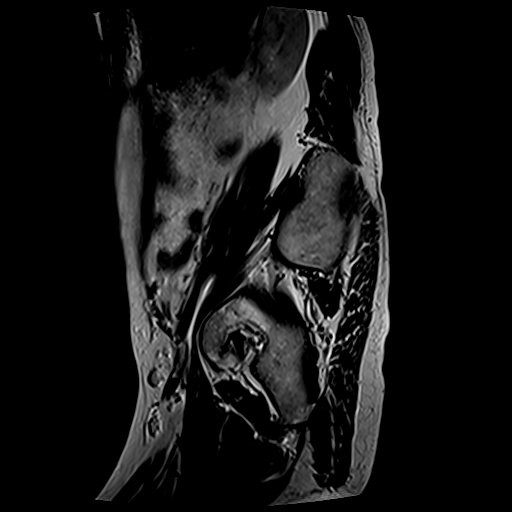
[im 11/43]
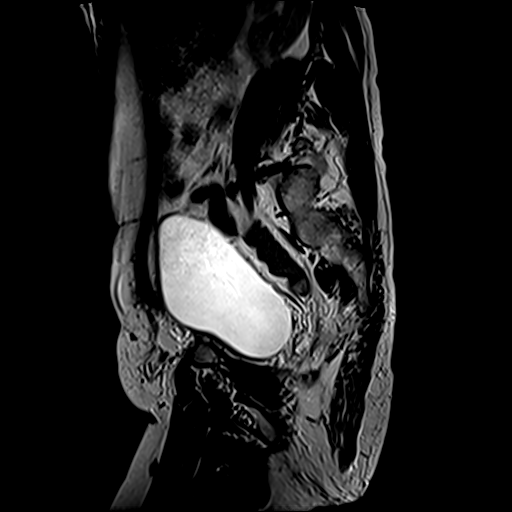
[im 22/43]
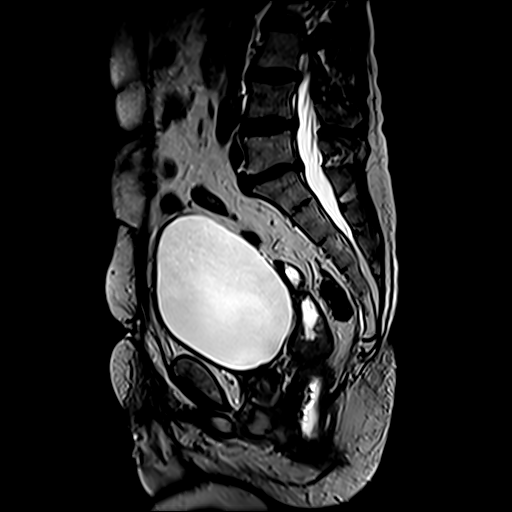
[im 32/43]
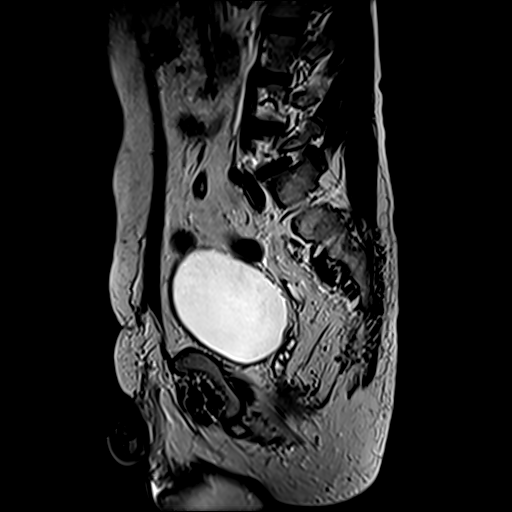
[im 43/43]
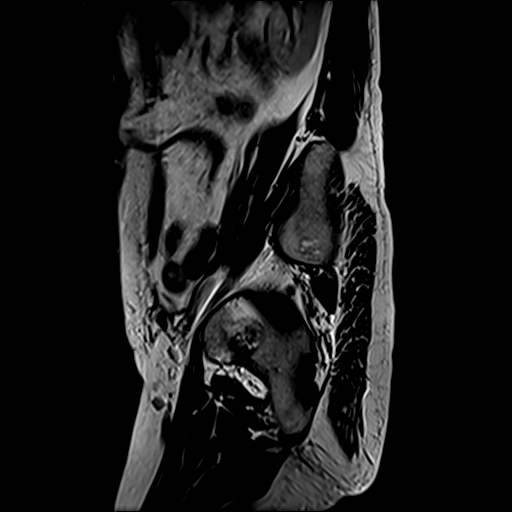

[Series 3: T2 · axial · 5.0mm · 1.04mm/px · z∈[-165,+99]mm · 6 of 45 slices shown (2 of 5)]
[im 1/45]
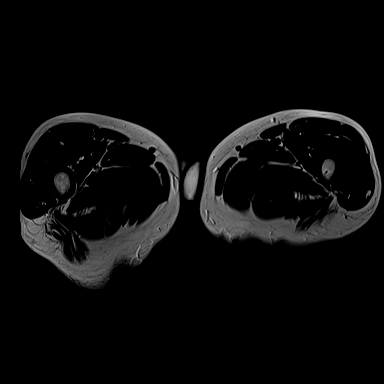
[im 9/45]
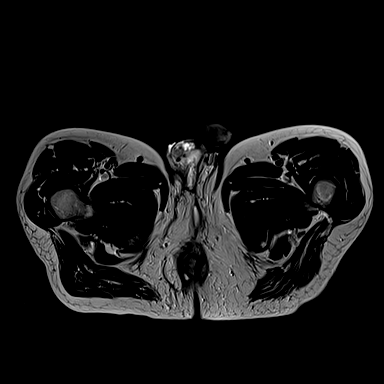
[im 18/45]
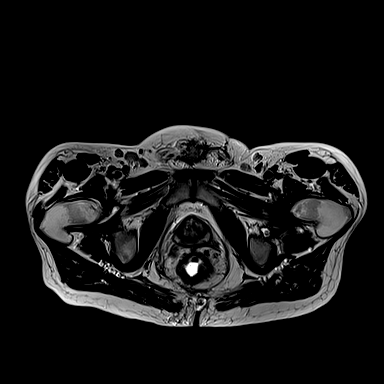
[im 27/45]
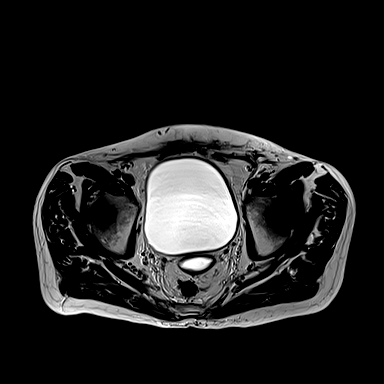
[im 36/45]
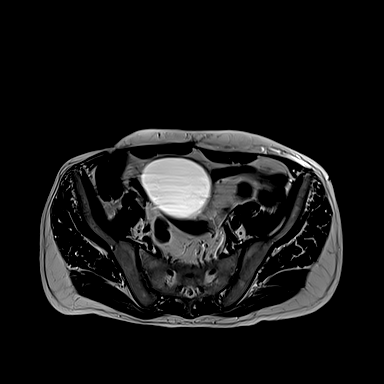
[im 45/45]
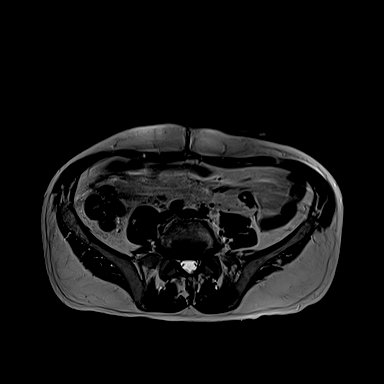

[Series 4: T2 · coronal · 3.0mm · 0.86mm/px · 6 of 47 slices shown (3 of 5)]
[im 1/47]
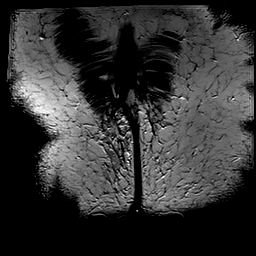
[im 10/47]
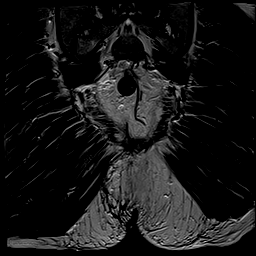
[im 19/47]
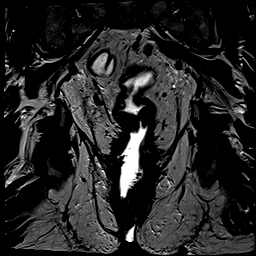
[im 28/47]
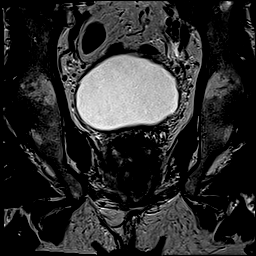
[im 37/47]
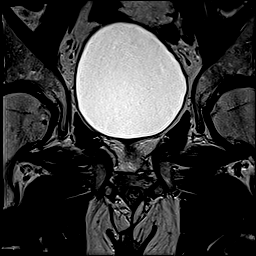
[im 47/47]
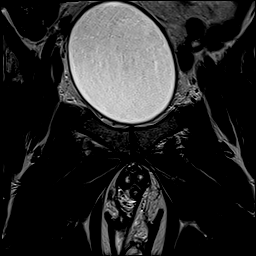

[Series 5: DWI · axial · 5.0mm · 1.48mm/px · z∈[-154,+110]mm · 11 of 89 slices shown (1 of 2)]
[im 1/89]
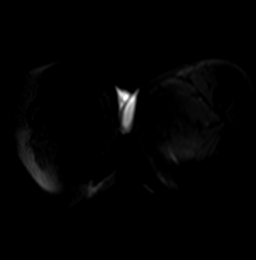
[im 9/89]
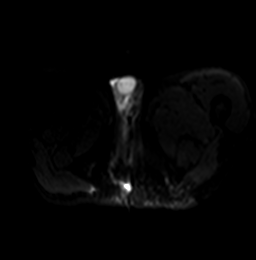
[im 18/89]
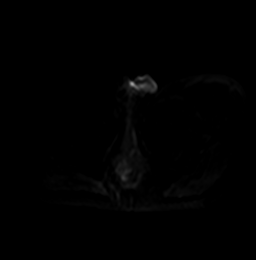
[im 27/89]
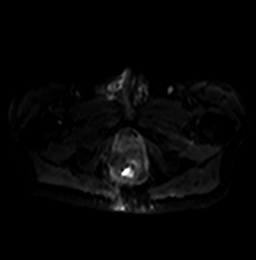
[im 36/89]
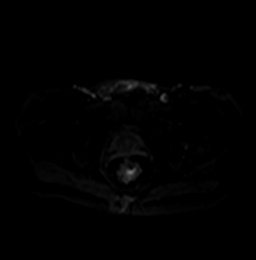
[im 45/89]
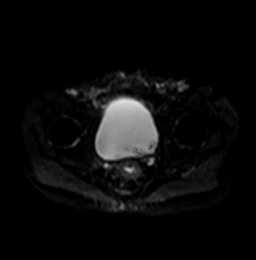
[im 53/89]
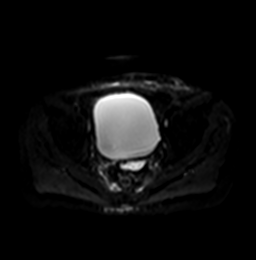
[im 62/89]
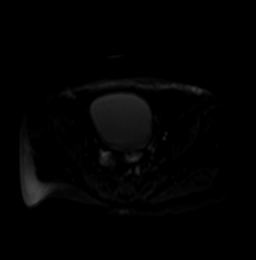
[im 71/89]
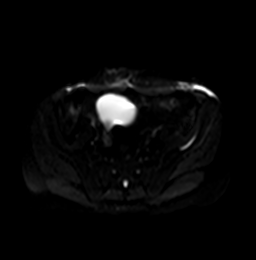
[im 80/89]
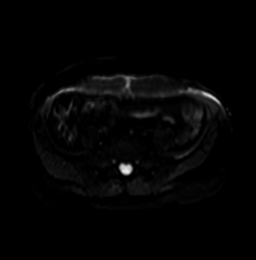
[im 89/89]
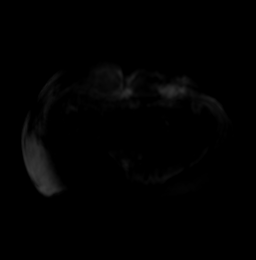

[Series 6: DWI · axial · 5.0mm · 1.48mm/px · z∈[-154,+110]mm · 6 of 45 slices shown (2 of 2)]
[im 1/45]
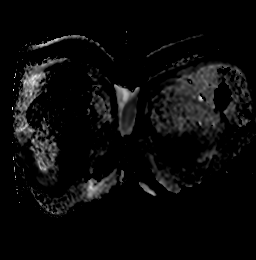
[im 9/45]
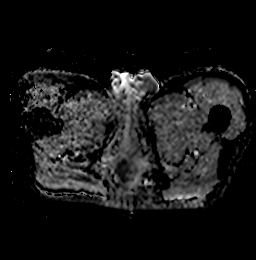
[im 18/45]
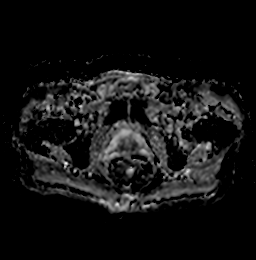
[im 27/45]
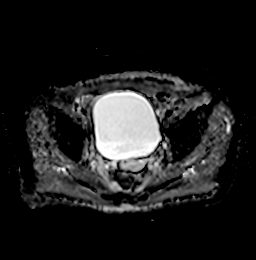
[im 36/45]
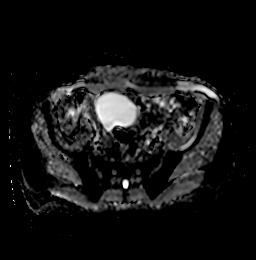
[im 45/45]
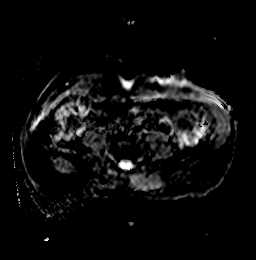

[Series 7: T2 · axial · 3.0mm · 0.70mm/px · z∈[-148,+29]mm · 8 of 60 slices shown (4 of 5)]
[im 1/60]
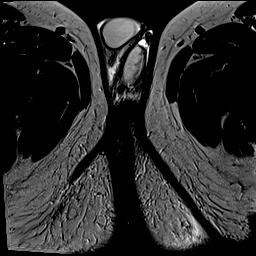
[im 9/60]
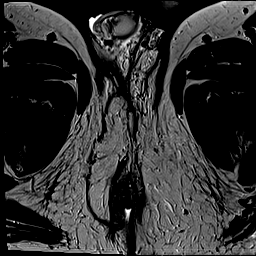
[im 17/60]
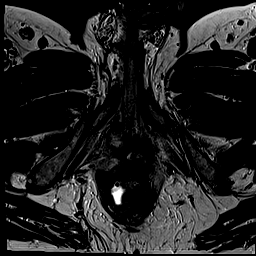
[im 26/60]
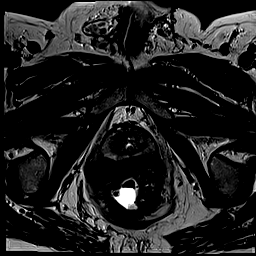
[im 34/60]
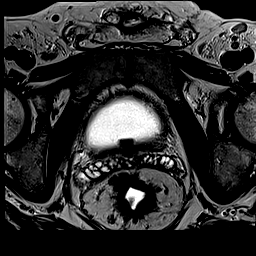
[im 43/60]
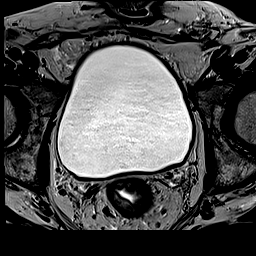
[im 51/60]
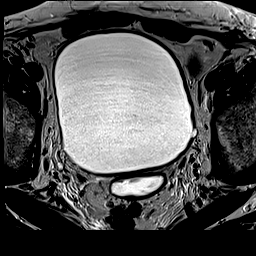
[im 60/60]
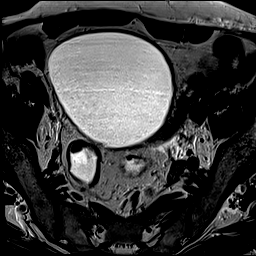

[Series 8: T2 · coronal · 3.0mm · 0.78mm/px · 3 of 47 slices shown (5 of 5)]
[im 1/47]
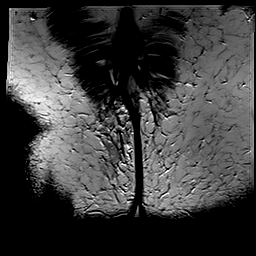
[im 10/47]
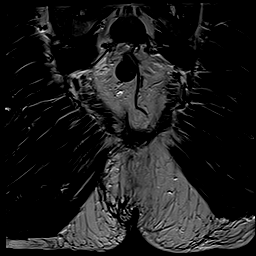
[im 19/47]
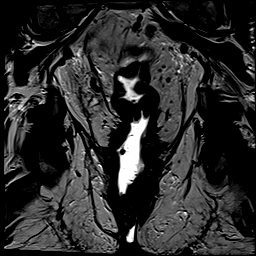

[45 of 48 positions shown; findings below may reference images not displayed]

FINDINGS: TUMOR LOCATION

Tumor distance from Anal Verge/Skin Surface: On the order of 3.3 cm
on [DATE]

Tumor distance to Internal Anal Sphincter: The distal portion of the
tumor involves the sphincters, including on 4[REDACTED].

TUMOR DESCRIPTION

Circumferential Extent: Superiorly, the tumor is relatively
circumferential, including on [DATE]. More inferiorly, it is
asymmetric along the left rectal wall and left mesorectum. Example
33/7.

Tumor Length: 8.6 cm on [DATE]

T - CATEGORY

Extension through Muscularis Propria: Present in multiple sites.
Example at 7 mm at the 11 o'clock position superiorly on [DATE]. Up to
1.4 cm at the 2 o'clock position within the mid rectum on 33/7. T3c

Shortest Distance of any tumor/node from Mesorectal Fascia: 0-the
tumor is immediately adjacent to and contacts the mesorectal fascia
including on 3[REDACTED].

Extramural Vascular Invasion/Tumor Thrombus: Suspected at an area of
neovascularity at the site of locally advanced tumor including on
33/7

Invasion of Anterior Peritoneal Reflection: Absent

Involvement of Adjacent Organs or Pelvic Sidewall: Tumor extends
towards the left side of the prostatic apex including on 36/7.
Abnormal signal in the left periprosthetic fat and along the left
prostatic capsule at the apex including on [DATE]. Abnormal signal
involving the anterior left pelvic floor including on 32/3.

Levator Ani Involvement: Present

N - CATEGORY

Mesorectal Lymph Nodes >=5mm: Presacral node of 1.8 x 1.9 cm on
[DATE]. Compare 2.2 x 2.2 cm on the prior CT.

A left perirectal node at the 5 o'clock position measures 5 mm on
[DATE] and is similar. N1
